# Patient Record
Sex: Male | Born: 1945
Health system: Southern US, Community
[De-identification: ages and names within clinical notes are randomized; demographics above are authoritative.]

## PROBLEM LIST (undated history)

## (undated) DIAGNOSIS — T8131XD Disruption of external operation (surgical) wound, not elsewhere classified, subsequent encounter: Secondary | ICD-10-CM

## (undated) DIAGNOSIS — E119 Type 2 diabetes mellitus without complications: Secondary | ICD-10-CM

## (undated) DIAGNOSIS — L97509 Non-pressure chronic ulcer of other part of unspecified foot with unspecified severity: Secondary | ICD-10-CM

## (undated) DIAGNOSIS — N183 Chronic kidney disease, stage 3 unspecified: Secondary | ICD-10-CM

## (undated) DIAGNOSIS — L02419 Cutaneous abscess of limb, unspecified: Secondary | ICD-10-CM

## (undated) DIAGNOSIS — M1990 Unspecified osteoarthritis, unspecified site: Secondary | ICD-10-CM

## (undated) DIAGNOSIS — M869 Osteomyelitis, unspecified: Secondary | ICD-10-CM

## (undated) DIAGNOSIS — L03119 Cellulitis of unspecified part of limb: Secondary | ICD-10-CM

## (undated) DIAGNOSIS — K219 Gastro-esophageal reflux disease without esophagitis: Secondary | ICD-10-CM

## (undated) DIAGNOSIS — E1142 Type 2 diabetes mellitus with diabetic polyneuropathy: Secondary | ICD-10-CM

## (undated) DIAGNOSIS — S88119A Complete traumatic amputation at level between knee and ankle, unspecified lower leg, initial encounter: Secondary | ICD-10-CM

## (undated) DIAGNOSIS — E11621 Type 2 diabetes mellitus with foot ulcer: Secondary | ICD-10-CM

## (undated) DIAGNOSIS — I639 Cerebral infarction, unspecified: Secondary | ICD-10-CM

## (undated) HISTORY — PX: EYE SURGERY: SHX253

## (undated) HISTORY — DX: Complete traumatic amputation at level between knee and ankle, unspecified lower leg, initial encounter: S88.119A

## (undated) HISTORY — DX: Cutaneous abscess of limb, unspecified: L02.419

## (undated) HISTORY — DX: Disruption of external operation (surgical) wound, not elsewhere classified, subsequent encounter: T81.31XD

## (undated) HISTORY — DX: Cellulitis of unspecified part of limb: L03.119

## (undated) HISTORY — PX: CATARACT EXTRACTION W/ INTRAOCULAR LENS  IMPLANT, BILATERAL: SHX1307

---

## 2011-07-19 HISTORY — PX: TOE AMPUTATION: SHX809

## 2014-10-03 ENCOUNTER — Emergency Department (HOSPITAL_COMMUNITY): Payer: Medicare Other

## 2014-10-03 ENCOUNTER — Encounter (HOSPITAL_COMMUNITY): Payer: Self-pay | Admitting: *Deleted

## 2014-10-03 ENCOUNTER — Emergency Department (HOSPITAL_COMMUNITY)
Admission: EM | Admit: 2014-10-03 | Discharge: 2014-10-03 | Disposition: A | Discharge status: Home / Self Care | Payer: Medicare Other | Attending: Emergency Medicine | Admitting: Emergency Medicine

## 2014-10-03 DIAGNOSIS — L97529 Non-pressure chronic ulcer of other part of left foot with unspecified severity: Secondary | ICD-10-CM | POA: Insufficient documentation

## 2014-10-03 DIAGNOSIS — E11621 Type 2 diabetes mellitus with foot ulcer: Secondary | ICD-10-CM | POA: Diagnosis not present

## 2014-10-03 DIAGNOSIS — M79672 Pain in left foot: Secondary | ICD-10-CM | POA: Diagnosis present

## 2014-10-03 LAB — CBC WITH DIFFERENTIAL/PLATELET
BASOS PCT: 0 % (ref 0–1)
Basophils Absolute: 0 10*3/uL (ref 0.0–0.1)
EOS ABS: 0 10*3/uL (ref 0.0–0.7)
Eosinophils Relative: 0 % (ref 0–5)
HCT: 38.6 % — ABNORMAL LOW (ref 39.0–52.0)
Hemoglobin: 13 g/dL (ref 13.0–17.0)
LYMPHS PCT: 14 % (ref 12–46)
Lymphs Abs: 1.1 10*3/uL (ref 0.7–4.0)
MCH: 29.9 pg (ref 26.0–34.0)
MCHC: 33.7 g/dL (ref 30.0–36.0)
MCV: 88.7 fL (ref 78.0–100.0)
Monocytes Absolute: 0.5 10*3/uL (ref 0.1–1.0)
Monocytes Relative: 6 % (ref 3–12)
NEUTROS ABS: 6.3 10*3/uL (ref 1.7–7.7)
NEUTROS PCT: 80 % — AB (ref 43–77)
Platelets: 175 10*3/uL (ref 150–400)
RBC: 4.35 MIL/uL (ref 4.22–5.81)
RDW: 12.4 % (ref 11.5–15.5)
WBC: 8 10*3/uL (ref 4.0–10.5)

## 2014-10-03 LAB — COMPREHENSIVE METABOLIC PANEL
ALK PHOS: 80 U/L (ref 39–117)
ALT: 16 U/L (ref 0–53)
AST: 15 U/L (ref 0–37)
Albumin: 3.8 g/dL (ref 3.5–5.2)
Anion gap: 9 (ref 5–15)
BUN: 21 mg/dL (ref 6–23)
CHLORIDE: 101 mmol/L (ref 96–112)
CO2: 23 mmol/L (ref 19–32)
Calcium: 9.2 mg/dL (ref 8.4–10.5)
Creatinine, Ser: 1.58 mg/dL — ABNORMAL HIGH (ref 0.50–1.35)
GFR calc non Af Amer: 43 mL/min — ABNORMAL LOW (ref >90)
GFR, EST AFRICAN AMERICAN: 50 mL/min — AB (ref >90)
Glucose, Bld: 388 mg/dL — ABNORMAL HIGH (ref 70–99)
POTASSIUM: 4.7 mmol/L (ref 3.5–5.1)
Sodium: 133 mmol/L — ABNORMAL LOW (ref 135–145)
TOTAL PROTEIN: 6.7 g/dL (ref 6.0–8.3)
Total Bilirubin: 1.1 mg/dL (ref 0.3–1.2)

## 2014-10-03 LAB — CBG MONITORING, ED: Glucose-Capillary: 255 mg/dL — ABNORMAL HIGH (ref 70–99)

## 2014-10-03 MED ORDER — PIPERACILLIN-TAZOBACTAM 3.375 G IVPB 30 MIN
3.3750 g | Freq: Once | INTRAVENOUS | Status: AC
  Administered 2014-10-03: 3.375 g via INTRAVENOUS
  Filled 2014-10-03: qty 50

## 2014-10-03 MED ORDER — SULFAMETHOXAZOLE-TRIMETHOPRIM 800-160 MG PO TABS: 1.0000 | ORAL_TABLET | Freq: Two times a day (BID) | ORAL | Status: DC

## 2014-10-03 MED ORDER — INSULIN ASPART 100 UNIT/ML ~~LOC~~ SOLN
12.0000 [IU] | Freq: Once | SUBCUTANEOUS | Status: DC
  Filled 2014-10-03: qty 1

## 2014-10-03 MED ORDER — SODIUM CHLORIDE 0.9 % IV BOLUS (SEPSIS)
1000.0000 mL | Freq: Once | INTRAVENOUS | Status: AC
  Administered 2014-10-03: 1000 mL via INTRAVENOUS

## 2014-10-03 MED ORDER — CLINDAMYCIN HCL 150 MG PO CAPS: 450.0000 mg | ORAL_CAPSULE | Freq: Three times a day (TID) | ORAL | Status: DC

## 2014-10-03 MED ORDER — CLINDAMYCIN PHOSPHATE 900 MG/50ML IV SOLN
900.0000 mg | Freq: Once | INTRAVENOUS | Status: AC
  Administered 2014-10-03: 900 mg via INTRAVENOUS
  Filled 2014-10-03: qty 50

## 2014-10-03 NOTE — Discharge Instructions (Signed)

## 2014-10-03 NOTE — ED Notes (Signed)
The pt is a diabetic and he has had a blister on his rt foot since December.  The pt noticed and increase in the size of the blister since yesterday.  It is red and swollen and more painful.

## 2014-10-03 NOTE — ED Notes (Signed)
Xray instructed order is for wrong foot, will switch it.

## 2014-10-03 NOTE — ED Provider Notes (Signed)
CSN: [id number]     Arrival date & time 10/03/14  1512 History   First MD Initiated Contact with Patient 10/03/14 1924     Chief Complaint  Patient presents with  . Foot Pain     (Consider location/radiation/quality/duration/timing/severity/associated sxs/prior Treatment) HPI  This is a 69 year old male, with a history of diabetes. He is coming in today with an ulcer he has had on the bottom of his left foot since December. He seen his podiatrist for the same ulcer been watching it. However, for the last 3-4 days he's noticed that there's been surrounding erythema. On Sunday of this last week he thinks he had a fever but none since then. His minimal pain associated with it, and no new injuries.  Past Medical History  Diagnosis Date  . Diabetes mellitus without complication    History reviewed. No pertinent past surgical history. No family history on file. History  Substance Use Topics  . Smoking status: Never Smoker   . Smokeless tobacco: Not on file  . Alcohol Use: Yes    Review of Systems  Constitutional: Negative for fever and chills.  Eyes: Negative for redness.  Respiratory: Negative for cough and shortness of breath.   Cardiovascular: Negative for chest pain.  Gastrointestinal: Negative for nausea, vomiting, abdominal pain and diarrhea.  Genitourinary: Negative for dysuria.  Skin: Positive for rash and wound.  Neurological: Negative for headaches.  All other systems reviewed and are negative.     Allergies  Review of patient's allergies indicates no known allergies.  Home Medications   Prior to Admission medications   Not on File   BP 163/86 mmHg  Pulse 88  Temp(Src) 98.1 F (36.7 C) (Oral)  Resp 18  SpO2 99% Physical Exam  Constitutional: He is oriented to person, place, and time. No distress.  HENT:  Head: Normocephalic and atraumatic.  Eyes: EOM are normal. Pupils are equal, round, and reactive to light.  Neck: Normal range of motion. Neck supple.   Cardiovascular: Normal rate.   Pulmonary/Chest: Effort normal. No respiratory distress.  Abdominal: Soft. There is no tenderness.  Musculoskeletal:  2cm ulcer on the plantar surface of the left foot.  There is mild surrounding erythema/warmth.  There is a small amount of pus coming out of the ulcer.  No crepitus.  No areas of fluctuance.  Able to probe the ulcer to bone  Neurological: He is alert and oriented to person, place, and time.  Skin: He is not diaphoretic.  Psychiatric: He has a normal mood and affect.  Vitals reviewed.   ED Course  Procedures (including critical care time) Labs Review Labs Reviewed  CBC WITH DIFFERENTIAL/PLATELET - Abnormal; Notable for the following:    HCT 38.6 (*)    Neutrophils Relative % 80 (*)    All other components within normal limits  COMPREHENSIVE METABOLIC PANEL - Abnormal; Notable for the following:    Sodium 133 (*)    Glucose, Bld 388 (*)    Creatinine, Ser 1.58 (*)    GFR calc non Af Amer 43 (*)    GFR calc Af Amer 50 (*)    All other components within normal limits    Imaging Review No results found.   EKG Interpretation None      MDM   Final diagnoses:  None    This is a 69 year old male, with a history of diabetes. He is coming in today with an ulcer he has had on the bottom of his right foot since  December. He seen his podiatrist for the same ulcer been watching it. However, last 3-4 days he's noticed that there's been surrounding erythema.  Exam as above, consistent with diabetic foot ulcer w/ surrounding cellulitis.  Able to probe the ulcer to bone raising some concern for possible osteomyelitis.  Plain film obtained and without obvious osteomyelitis.  Labs obtained and no leukocytosis, glucose elevated without acidosis.  Normal vital signs.  Non-toxic appearing  Have recommended admission given this infection with poor glycemic control however patient is adamant about going home.  Have given clinda in the ED.  Will  d/c on bactrim/clinda for possible polymicrobial infection.  Have offered admission multiple times for obs but patient wants to go home. He realizes that this infection could easily worsen possibly resulting in later need for amputation.  Patient will follow up closely with his podiatrist or pcp this coming Monday.  I have discussed the results, Dx and Tx plan with the patient. They expressed understanding and agree with the plan and were told to return to ED with any worsening of condition or concern.    Disposition: Discharge  Condition: Good  Discharge Medication List as of 10/03/2014 10:33 PM    START taking these medications   Details  clindamycin (CLEOCIN) 150 MG capsule Take 3 capsules (450 mg total) by mouth 3 (three) times daily., Starting 10/03/2014, Until Discontinued, Print    sulfamethoxazole-trimethoprim (SEPTRA DS) 800-160 MG per tablet Take 1 tablet by mouth every 12 (twelve) hours., Starting 10/03/2014, Until Discontinued, Print        Follow Up: [facility]              [address] [place] [id number] [phone] [id number] In 3 days    Pt seen in conjunction with Dr. Imagene Shellersteinl     Homar Weinkauf, MD 10/04/14 1739  Cathren LaineKevin Steinl, MD 10/05/14 [phone]

## 2014-10-03 NOTE — ED Notes (Signed)
Toe amp on the rt foot 4 years ago

## 2014-10-06 ENCOUNTER — Encounter (HOSPITAL_BASED_OUTPATIENT_CLINIC_OR_DEPARTMENT_OTHER): Payer: Medicare Other | Attending: Plastic Surgery

## 2014-10-06 DIAGNOSIS — G629 Polyneuropathy, unspecified: Secondary | ICD-10-CM | POA: Insufficient documentation

## 2014-10-06 DIAGNOSIS — E11621 Type 2 diabetes mellitus with foot ulcer: Secondary | ICD-10-CM | POA: Insufficient documentation

## 2014-10-06 DIAGNOSIS — L97421 Non-pressure chronic ulcer of left heel and midfoot limited to breakdown of skin: Secondary | ICD-10-CM | POA: Insufficient documentation

## 2014-10-06 DIAGNOSIS — Z89421 Acquired absence of other right toe(s): Secondary | ICD-10-CM | POA: Diagnosis not present

## 2014-10-09 DIAGNOSIS — Z89421 Acquired absence of other right toe(s): Secondary | ICD-10-CM | POA: Diagnosis not present

## 2014-10-09 DIAGNOSIS — G629 Polyneuropathy, unspecified: Secondary | ICD-10-CM | POA: Diagnosis not present

## 2014-10-09 DIAGNOSIS — L97421 Non-pressure chronic ulcer of left heel and midfoot limited to breakdown of skin: Secondary | ICD-10-CM | POA: Diagnosis not present

## 2014-10-09 DIAGNOSIS — E11621 Type 2 diabetes mellitus with foot ulcer: Secondary | ICD-10-CM | POA: Diagnosis not present

## 2014-10-13 ENCOUNTER — Inpatient Hospital Stay (HOSPITAL_COMMUNITY)
Admission: EM | Admit: 2014-10-13 | Discharge: 2014-10-20 | DRG: 982 | Disposition: A | Discharge status: Home / Self Care | Payer: Medicare Other | Attending: Internal Medicine | Admitting: Internal Medicine

## 2014-10-13 ENCOUNTER — Emergency Department (HOSPITAL_COMMUNITY): Payer: Medicare Other

## 2014-10-13 ENCOUNTER — Encounter (HOSPITAL_COMMUNITY): Payer: Self-pay | Admitting: Neurology

## 2014-10-13 ENCOUNTER — Ambulatory Visit (INDEPENDENT_AMBULATORY_CARE_PROVIDER_SITE_OTHER)
Admission: RE | Admit: 2014-10-13 | Discharge: 2014-10-13 | Disposition: A | Discharge status: Home / Self Care | Payer: Medicare Other | Source: Ambulatory Visit | Attending: Surgery | Admitting: Surgery

## 2014-10-13 ENCOUNTER — Other Ambulatory Visit (HOSPITAL_COMMUNITY): Payer: Self-pay | Admitting: Plastic Surgery

## 2014-10-13 DIAGNOSIS — L97423 Non-pressure chronic ulcer of left heel and midfoot with necrosis of muscle: Secondary | ICD-10-CM | POA: Diagnosis present

## 2014-10-13 DIAGNOSIS — Z89421 Acquired absence of other right toe(s): Secondary | ICD-10-CM | POA: Diagnosis not present

## 2014-10-13 DIAGNOSIS — L97523 Non-pressure chronic ulcer of other part of left foot with necrosis of muscle: Secondary | ICD-10-CM | POA: Diagnosis present

## 2014-10-13 DIAGNOSIS — E1122 Type 2 diabetes mellitus with diabetic chronic kidney disease: Secondary | ICD-10-CM | POA: Diagnosis present

## 2014-10-13 DIAGNOSIS — D649 Anemia, unspecified: Secondary | ICD-10-CM

## 2014-10-13 DIAGNOSIS — L02612 Cutaneous abscess of left foot: Secondary | ICD-10-CM | POA: Diagnosis present

## 2014-10-13 DIAGNOSIS — L02419 Cutaneous abscess of limb, unspecified: Secondary | ICD-10-CM

## 2014-10-13 DIAGNOSIS — E875 Hyperkalemia: Secondary | ICD-10-CM | POA: Diagnosis not present

## 2014-10-13 DIAGNOSIS — E860 Dehydration: Secondary | ICD-10-CM | POA: Diagnosis present

## 2014-10-13 DIAGNOSIS — E083219 Diabetes mellitus due to underlying condition with mild nonproliferative diabetic retinopathy with macular edema, unspecified eye: Secondary | ICD-10-CM

## 2014-10-13 DIAGNOSIS — E1121 Type 2 diabetes mellitus with diabetic nephropathy: Secondary | ICD-10-CM | POA: Diagnosis not present

## 2014-10-13 DIAGNOSIS — E119 Type 2 diabetes mellitus without complications: Secondary | ICD-10-CM

## 2014-10-13 DIAGNOSIS — L03119 Cellulitis of unspecified part of limb: Secondary | ICD-10-CM | POA: Diagnosis not present

## 2014-10-13 DIAGNOSIS — D631 Anemia in chronic kidney disease: Secondary | ICD-10-CM | POA: Diagnosis present

## 2014-10-13 DIAGNOSIS — E1129 Type 2 diabetes mellitus with other diabetic kidney complication: Secondary | ICD-10-CM

## 2014-10-13 DIAGNOSIS — E871 Hypo-osmolality and hyponatremia: Secondary | ICD-10-CM | POA: Diagnosis present

## 2014-10-13 DIAGNOSIS — L97529 Non-pressure chronic ulcer of other part of left foot with unspecified severity: Secondary | ICD-10-CM

## 2014-10-13 DIAGNOSIS — E118 Type 2 diabetes mellitus with unspecified complications: Secondary | ICD-10-CM

## 2014-10-13 DIAGNOSIS — N183 Chronic kidney disease, stage 3 unspecified: Secondary | ICD-10-CM

## 2014-10-13 DIAGNOSIS — L97421 Non-pressure chronic ulcer of left heel and midfoot limited to breakdown of skin: Secondary | ICD-10-CM | POA: Diagnosis not present

## 2014-10-13 DIAGNOSIS — D509 Iron deficiency anemia, unspecified: Secondary | ICD-10-CM | POA: Diagnosis present

## 2014-10-13 DIAGNOSIS — L98499 Non-pressure chronic ulcer of skin of other sites with unspecified severity: Secondary | ICD-10-CM | POA: Diagnosis not present

## 2014-10-13 DIAGNOSIS — E1165 Type 2 diabetes mellitus with hyperglycemia: Secondary | ICD-10-CM | POA: Diagnosis present

## 2014-10-13 DIAGNOSIS — Z89422 Acquired absence of other left toe(s): Secondary | ICD-10-CM

## 2014-10-13 DIAGNOSIS — G629 Polyneuropathy, unspecified: Secondary | ICD-10-CM | POA: Diagnosis not present

## 2014-10-13 DIAGNOSIS — E11621 Type 2 diabetes mellitus with foot ulcer: Principal | ICD-10-CM | POA: Insufficient documentation

## 2014-10-13 HISTORY — DX: Type 2 diabetes mellitus with foot ulcer: E11.621

## 2014-10-13 HISTORY — DX: Non-pressure chronic ulcer of other part of unspecified foot with unspecified severity: L97.509

## 2014-10-13 HISTORY — DX: Chronic kidney disease, stage 3 unspecified: N18.30

## 2014-10-13 HISTORY — DX: Chronic kidney disease, stage 3 (moderate): N18.3

## 2014-10-13 LAB — CBC WITH DIFFERENTIAL/PLATELET
Basophils Absolute: 0.1 10*3/uL (ref 0.0–0.1)
Basophils Relative: 0 % (ref 0–1)
Eosinophils Absolute: 0 10*3/uL (ref 0.0–0.7)
Eosinophils Relative: 0 % (ref 0–5)
HCT: 36.3 % — ABNORMAL LOW (ref 39.0–52.0)
Hemoglobin: 12.4 g/dL — ABNORMAL LOW (ref 13.0–17.0)
Lymphocytes Relative: 10 % — ABNORMAL LOW (ref 12–46)
Lymphs Abs: 1.3 10*3/uL (ref 0.7–4.0)
MCH: 29.6 pg (ref 26.0–34.0)
MCHC: 34.2 g/dL (ref 30.0–36.0)
MCV: 86.6 fL (ref 78.0–100.0)
Monocytes Absolute: 0.9 10*3/uL (ref 0.1–1.0)
Monocytes Relative: 7 % (ref 3–12)
Neutro Abs: 10.4 10*3/uL — ABNORMAL HIGH (ref 1.7–7.7)
Neutrophils Relative %: 83 % — ABNORMAL HIGH (ref 43–77)
PLATELETS: 329 10*3/uL (ref 150–400)
RBC: 4.19 MIL/uL — ABNORMAL LOW (ref 4.22–5.81)
RDW: 12.9 % (ref 11.5–15.5)
WBC: 12.6 10*3/uL — ABNORMAL HIGH (ref 4.0–10.5)

## 2014-10-13 LAB — BASIC METABOLIC PANEL
Anion gap: 12 (ref 5–15)
BUN: 30 mg/dL — ABNORMAL HIGH (ref 6–23)
CALCIUM: 9.1 mg/dL (ref 8.4–10.5)
CO2: 20 mmol/L (ref 19–32)
CREATININE: 1.69 mg/dL — AB (ref 0.50–1.35)
Chloride: 95 mmol/L — ABNORMAL LOW (ref 96–112)
GFR calc Af Amer: 46 mL/min — ABNORMAL LOW (ref >90)
GFR, EST NON AFRICAN AMERICAN: 40 mL/min — AB (ref >90)
GLUCOSE: 336 mg/dL — AB (ref 70–99)
Potassium: 5.2 mmol/L — ABNORMAL HIGH (ref 3.5–5.1)
SODIUM: 127 mmol/L — AB (ref 135–145)

## 2014-10-13 LAB — SEDIMENTATION RATE: Sed Rate: 115 mm/hr — ABNORMAL HIGH (ref 0–16)

## 2014-10-13 LAB — GLUCOSE, CAPILLARY: GLUCOSE-CAPILLARY: 253 mg/dL — AB (ref 70–99)

## 2014-10-13 LAB — CBG MONITORING, ED: Glucose-Capillary: 341 mg/dL — ABNORMAL HIGH (ref 70–99)

## 2014-10-13 MED ORDER — INSULIN ASPART 100 UNIT/ML ~~LOC~~ SOLN
0.0000 [IU] | Freq: Three times a day (TID) | SUBCUTANEOUS | Status: DC
  Administered 2014-10-14: 7 [IU] via SUBCUTANEOUS
  Administered 2014-10-14: 5 [IU] via SUBCUTANEOUS
  Administered 2014-10-14: 3 [IU] via SUBCUTANEOUS
  Administered 2014-10-15: 5 [IU] via SUBCUTANEOUS
  Administered 2014-10-15: 3 [IU] via SUBCUTANEOUS
  Administered 2014-10-15: 7 [IU] via SUBCUTANEOUS
  Administered 2014-10-16 (×2): 5 [IU] via SUBCUTANEOUS

## 2014-10-13 MED ORDER — PIPERACILLIN-TAZOBACTAM 3.375 G IVPB
3.3750 g | Freq: Three times a day (TID) | INTRAVENOUS | Status: DC
  Administered 2014-10-14 – 2014-10-20 (×19): 3.375 g via INTRAVENOUS
  Filled 2014-10-13 (×22): qty 50

## 2014-10-13 MED ORDER — VANCOMYCIN HCL 10 G IV SOLR
2500.0000 mg | Freq: Once | INTRAVENOUS | Status: AC
  Administered 2014-10-13: 2500 mg via INTRAVENOUS
  Filled 2014-10-13: qty 2500

## 2014-10-13 MED ORDER — ENOXAPARIN SODIUM 30 MG/0.3ML ~~LOC~~ SOLN
30.0000 mg | SUBCUTANEOUS | Status: DC
  Filled 2014-10-13: qty 0.3

## 2014-10-13 MED ORDER — GLIMEPIRIDE 4 MG PO TABS
4.0000 mg | ORAL_TABLET | Freq: Two times a day (BID) | ORAL | Status: DC
  Administered 2014-10-13 – 2014-10-15 (×3): 4 mg via ORAL
  Filled 2014-10-13 (×4): qty 1

## 2014-10-13 MED ORDER — SODIUM POLYSTYRENE SULFONATE 15 GM/60ML PO SUSP
15.0000 g | Freq: Once | ORAL | Status: AC
Start: 2014-10-13 — End: 2014-10-14
  Administered 2014-10-14: 15 g via ORAL
  Filled 2014-10-13: qty 60

## 2014-10-13 MED ORDER — INSULIN ASPART 100 UNIT/ML ~~LOC~~ SOLN
0.0000 [IU] | Freq: Every day | SUBCUTANEOUS | Status: DC
  Administered 2014-10-13 – 2014-10-15 (×2): 3 [IU] via SUBCUTANEOUS
  Administered 2014-10-16 – 2014-10-18 (×2): 2 [IU] via SUBCUTANEOUS

## 2014-10-13 MED ORDER — ACETAMINOPHEN 650 MG RE SUPP: 650.0000 mg | Freq: Four times a day (QID) | RECTAL | Status: DC | PRN

## 2014-10-13 MED ORDER — CIPROFLOXACIN IN D5W 400 MG/200ML IV SOLN: 400.0000 mg | Freq: Two times a day (BID) | INTRAVENOUS | Status: DC

## 2014-10-13 MED ORDER — SODIUM CHLORIDE 0.9 % IV SOLN
INTRAVENOUS | Status: AC
  Administered 2014-10-13: 23:00:00 via INTRAVENOUS

## 2014-10-13 MED ORDER — PIPERACILLIN-TAZOBACTAM 3.375 G IVPB 30 MIN
3.3750 g | Freq: Once | INTRAVENOUS | Status: AC
  Administered 2014-10-14: 3.375 g via INTRAVENOUS

## 2014-10-13 MED ORDER — INSULIN ASPART 100 UNIT/ML ~~LOC~~ SOLN
10.0000 [IU] | Freq: Once | SUBCUTANEOUS | Status: AC
  Administered 2014-10-13: 10 [IU] via SUBCUTANEOUS
  Filled 2014-10-13: qty 1

## 2014-10-13 MED ORDER — PIPERACILLIN-TAZOBACTAM 3.375 G IVPB: 3.3750 g | Freq: Three times a day (TID) | INTRAVENOUS | Status: DC

## 2014-10-13 MED ORDER — SODIUM CHLORIDE 0.9 % IV BOLUS (SEPSIS)
1000.0000 mL | Freq: Once | INTRAVENOUS | Status: AC
  Administered 2014-10-13: 1000 mL via INTRAVENOUS

## 2014-10-13 MED ORDER — ACETAMINOPHEN 325 MG PO TABS
650.0000 mg | ORAL_TABLET | Freq: Four times a day (QID) | ORAL | Status: DC | PRN
  Administered 2014-10-16: 650 mg via ORAL
  Filled 2014-10-13 (×2): qty 2

## 2014-10-13 MED ORDER — SODIUM CHLORIDE 0.9 % IV SOLN
1.0000 g | Freq: Once | INTRAVENOUS | Status: AC
  Administered 2014-10-13: 1 g via INTRAVENOUS
  Filled 2014-10-13: qty 10

## 2014-10-13 NOTE — ED Provider Notes (Signed)
CSN: [id number]     Arrival date & time 10/13/14  1630 History   None    Chief Complaint  Patient presents with  . Wound Check     (Consider location/radiation/quality/duration/timing/severity/associated sxs/prior Treatment) HPI   69 year old male with history of non-insulin-dependent diabetes presenting for evaluation of chronic diabetic foot ulcer. Patient has an ulcer to the palm of his left foot since December. He has been seen and evaluated by his podiatrist. He was recently evaluated in the ED on 3/18 for worsening infection of this foot. At that time it was deemed that he would benefit from hospital admission for IV abx and further treatment of his foot infection.  Pt decline admission.  He was discharged with clinda/bactrim.  Today he was brough there by wife from wound care center due to worsening of his wound to L foot.  Despite taking abx as prescribed the infection has not improved.  Wife report decreased in appetite, eating much less than usual.  Pt report decrease vision to both eyes.  Otherwise, he denies fever, chills, cp, sob, abd pain, increasing foot pain.  Pt lives in [place], does not have local PCP.  He is amenable to hospital admission this time.    Past Medical History  Diagnosis Date  . Diabetes mellitus without complication    History reviewed. No pertinent past surgical history. No family history on file. History  Substance Use Topics  . Smoking status: Never Smoker   . Smokeless tobacco: Not on file  . Alcohol Use: Yes    Review of Systems  All other systems reviewed and are negative.     Allergies  Review of patient's allergies indicates no known allergies.  Home Medications   Prior to Admission medications   Medication Sig Start Date End Date Taking? Authorizing Provider  clindamycin (CLEOCIN) 150 MG capsule Take 3 capsules (450 mg total) by mouth 3 (three) times daily. 10/03/14   Silas Flood, MD  glimepiride (AMARYL) 4 MG tablet Take 4 mg by  mouth 2 (two) times daily. 08/21/14   Historical Provider, MD  Omega-3 Fatty Acids (OMEGA 3 PO) Take 1 capsule by mouth every evening.    Historical Provider, MD  Omega-3 Fatty Acids (SEA-OMEGA 70 PO) Take 1 capsule by mouth every morning.    Historical Provider, MD  sulfamethoxazole-trimethoprim (SEPTRA DS) 800-160 MG per tablet Take 1 tablet by mouth every 12 (twelve) hours. 10/03/14   Silas Flood, MD   BP 125/62 mmHg  Pulse 90  Temp(Src) 98 F (36.7 C)  Resp 18  SpO2 100% Physical Exam  Constitutional: He appears well-developed and well-nourished. No distress.  HENT:  Head: Atraumatic.  Eyes: Conjunctivae are normal.  Neck: Normal range of motion. Neck supple.  Cardiovascular: Normal rate and regular rhythm.   Pulmonary/Chest: Effort normal and breath sounds normal.  Abdominal: Soft. He exhibits no distension. There is no tenderness.  Neurological: He is alert.  Skin: No rash noted.  L foot: two 3mm ulcers to sole of foot with packing in place.  Wound is approximately 6mm in depth on the plantar surface.  Minimal tenderness, mild surrounding erythema.  Intact distal pulse.  Psychiatric: He has a normal mood and affect.    ED Course  Procedures (including critical care time)  6:29 PM Worsening diabetic foot ulcer to L foot, which failed outpt abx treatment including clinda/bactrim.  Xray of L foot shows mild cortical irregularity of the first metatarsal head, which osteomyelitis is not excluded.  Given a  tunneling wound that failed outpt treatment, will initiate vanc/cipro for suspect osteomyelitis due to diabetic foot ulcer.  Evidence of renal insufficiency, likely acute given pt decreased appetite.  Pt is hyperglycemic with normal anion gap.  Insulin given along with IVF.  Care discussed with DR. Littie DeedsGentry, plan to admit pt for further care.    7:39 PM Appreciated consult from Hospitalist Dr. Selena BattenKim who agrees to admit pt to med surg for further care.  Pt made aware of plan.    Labs  Review Labs Reviewed  CBC WITH DIFFERENTIAL/PLATELET - Abnormal; Notable for the following:    WBC 12.6 (*)    RBC 4.19 (*)    Hemoglobin 12.4 (*)    HCT 36.3 (*)    Neutrophils Relative % 83 (*)    Neutro Abs 10.4 (*)    Lymphocytes Relative 10 (*)    All other components within normal limits  BASIC METABOLIC PANEL - Abnormal; Notable for the following:    Sodium 127 (*)    Potassium 5.2 (*)    Chloride 95 (*)    Glucose, Bld 336 (*)    BUN 30 (*)    Creatinine, Ser 1.69 (*)    GFR calc non Af Amer 40 (*)    GFR calc Af Amer 46 (*)    All other components within normal limits  CBG MONITORING, ED - Abnormal; Notable for the following:    Glucose-Capillary 341 (*)    All other components within normal limits    Imaging Review Dg Foot Complete Left  10/13/2014   CLINICAL DATA:  Left foot wound with swelling and pain.  EXAM: LEFT FOOT - COMPLETE 3+ VIEW  COMPARISON:  10/03/2014  FINDINGS: Soft tissue swelling is noted.  There is no evidence of acute fracture, subluxation or dislocation.  There is mild cortical irregularity of the lateral first metatarsal head. Osteomyelitis is not excluded.  A calcaneal spur is present.  IMPRESSION: Mild cortical irregularity of the first metatarsal head -osteomyelitis is not excluded. Correlate with wound location.  Soft tissue swelling.   Electronically Signed   By: Harmon PierJeffrey  Hu M.D.   On: 10/13/2014 18:14     EKG Interpretation None      MDM   Final diagnoses:  Diabetic ulcer of left foot associated with type 2 diabetes mellitus    BP 130/81 mmHg  Pulse 79  Temp(Src) 98.6 F (37 C) (Oral)  Resp 20  Ht 6\' 2"  (1.88 m)  Wt 220 lb (99.791 kg)  BMI 28.23 kg/m2  SpO2 100%  I have reviewed nursing notes and vital signs. I personally reviewed the imaging tests through PACS system  I reviewed available ER/hospitalization records thought the EMR     Fayrene HelperBowie Ishan Sanroman, PA-C 10/13/14 1940  Mirian MoMatthew Gentry, MD 10/15/14 [phone]

## 2014-10-13 NOTE — H&P (Signed)
Gared Gillie is an 69 y.o. male.    Pcp:  Dr. Teryl Lucy [facility] [place])  Chief Complaint: diabetic foot ulcer HPI: 69 yo male with dm2, c/o diabetic foot ulcer started in 06/2014 and was getting better and then got worse over the past 10 days,  Slight yellow drainage.  Pt denies fever, chills, cp, palp, sob.  Pt has been following with the wound clinic.  Pt sent to ER from wound clinic.  Pt will be admitted for outpatient failure of abx for diabetic foot ulcer.    Past Medical History  Diagnosis Date  . Diabetes mellitus without complication   . Diabetic foot ulcer     left  . CKD (chronic kidney disease) stage 3, GFR 30-59 ml/min     Past Surgical History  Procedure Laterality Date  . Toe amputation      Family History  Problem Relation Age of Onset  . Dementia Mother    Social History:  reports that he has never smoked. He does not have any smokeless tobacco history on file. He reports that he drinks about 0.6 oz of alcohol per week. His drug history is not on file.  Allergies: No Known Allergies Medicin e reviewed  (Not in a hospital admission)  Results for orders placed or performed during the hospital encounter of 10/13/14 (from the past 48 hour(s))  POC CBG, ED     Status: Abnormal   Collection Time: 10/13/14  4:47 PM  Result Value Ref Range   Glucose-Capillary 341 (H) 70 - 99 mg/dL  CBC with Differential     Status: Abnormal   Collection Time: 10/13/14  4:53 PM  Result Value Ref Range   WBC 12.6 (H) 4.0 - 10.5 K/uL   RBC 4.19 (L) 4.22 - 5.81 MIL/uL   Hemoglobin 12.4 (L) 13.0 - 17.0 g/dL   HCT 36.3 (L) 39.0 - 52.0 %   MCV 86.6 78.0 - 100.0 fL   MCH 29.6 26.0 - 34.0 pg   MCHC 34.2 30.0 - 36.0 g/dL   RDW 12.9 11.5 - 15.5 %   Platelets 329 150 - 400 K/uL   Neutrophils Relative % 83 (H) 43 - 77 %   Neutro Abs 10.4 (H) 1.7 - 7.7 K/uL   Lymphocytes Relative 10 (L) 12 - 46 %   Lymphs Abs 1.3 0.7 - 4.0 K/uL   Monocytes Relative 7 3 - 12 %   Monocytes Absolute 0.9 0.1  - 1.0 K/uL   Eosinophils Relative 0 0 - 5 %   Eosinophils Absolute 0.0 0.0 - 0.7 K/uL   Basophils Relative 0 0 - 1 %   Basophils Absolute 0.1 0.0 - 0.1 K/uL  Basic metabolic panel     Status: Abnormal   Collection Time: 10/13/14  4:53 PM  Result Value Ref Range   Sodium 127 (L) 135 - 145 mmol/L   Potassium 5.2 (H) 3.5 - 5.1 mmol/L   Chloride 95 (L) 96 - 112 mmol/L   CO2 20 19 - 32 mmol/L   Glucose, Bld 336 (H) 70 - 99 mg/dL   BUN 30 (H) 6 - 23 mg/dL   Creatinine, Ser 1.69 (H) 0.50 - 1.35 mg/dL   Calcium 9.1 8.4 - 10.5 mg/dL   GFR calc non Af Amer 40 (L) >90 mL/min   GFR calc Af Amer 46 (L) >90 mL/min    Comment: (NOTE) The eGFR has been calculated using the CKD EPI equation. This calculation has not been validated in all clinical situations.  eGFR's persistently <90 mL/min signify possible Chronic Kidney Disease.    Anion gap 12 5 - 15   Dg Foot Complete Left  10/13/2014   CLINICAL DATA:  Left foot wound with swelling and pain.  EXAM: LEFT FOOT - COMPLETE 3+ VIEW  COMPARISON:  10/03/2014  FINDINGS: Soft tissue swelling is noted.  There is no evidence of acute fracture, subluxation or dislocation.  There is mild cortical irregularity of the lateral first metatarsal head. Osteomyelitis is not excluded.  A calcaneal spur is present.  IMPRESSION: Mild cortical irregularity of the first metatarsal head -osteomyelitis is not excluded. Correlate with wound location.  Soft tissue swelling.   Electronically Signed   By: Margarette Canada M.D.   On: 10/13/2014 18:14    Review of Systems  Constitutional: Negative.   HENT: Negative.   Eyes: Negative.   Respiratory: Negative.   Cardiovascular: Negative.   Gastrointestinal: Negative.   Genitourinary: Negative.   Musculoskeletal: Negative.   Skin:       + ulcer on the bottom of the left ft  Neurological: Negative.   Endo/Heme/Allergies: Negative.   Psychiatric/Behavioral: Negative.     Blood pressure 130/81, pulse 79, temperature 98.6 F (37  C), temperature source Oral, resp. rate 20, height 6' 2"  (1.88 m), weight 99.791 kg (220 lb), SpO2 100 %. Physical Exam  Constitutional: He is oriented to person, place, and time. He appears well-developed and well-nourished.  HENT:  Head: Normocephalic and atraumatic.  Mouth/Throat: No oropharyngeal exudate.  Eyes: Conjunctivae and EOM are normal. Pupils are equal, round, and reactive to light. No scleral icterus.  Neck: Normal range of motion. Neck supple. No JVD present. No tracheal deviation present. No thyromegaly present.  Cardiovascular: Normal rate and regular rhythm.  Exam reveals no gallop and no friction rub.   No murmur heard. Respiratory: Effort normal and breath sounds normal. No respiratory distress. He has no wheezes. He has no rales.  GI: Soft. Bowel sounds are normal. He exhibits no distension. There is no tenderness. There is no rebound and no guarding.  Musculoskeletal: Normal range of motion. He exhibits no edema or tenderness.  Lymphadenopathy:    He has no cervical adenopathy.  Neurological: He is alert and oriented to person, place, and time. He has normal reflexes. He displays normal reflexes. No cranial nerve deficit. He exhibits normal muscle tone. Coordination normal.  Skin:  10m ulcer with yellow drainage on the plantar aspect of the left foot, and another more superficial ulcer stage1 0.5cm, clean  Psychiatric: He has a normal mood and affect. His behavior is normal. Judgment and thought content normal.     Assessment/Plan Skin ulcer Wound culture Blood culture x2 Esr, crp vanco iv,  Zosyn iv pharmacy to dose  Anemia Check cbc in am  Hyperkalemia calclium gluconate,  Kayexalate  CKD stage 3 Ns iv  Hyponatremia Check serum osm, tsh, cortisol, urine sodium, urine osm  Dm2 fsbs ac and qhs, iss,   DVT prophylaxis, scd lovenox  Hannia Matchett 10/13/2014, 8:07 PM

## 2014-10-13 NOTE — ED Notes (Signed)
Pt reports wound to arch of left foot; sent from wound center with concern of worsening infection and need for IV antibiotics. Concern for inf in the bone. Also, blood sugars have been running high. Pt is a x 4

## 2014-10-13 NOTE — ED Notes (Signed)
PATIENT BROUGHT TO ED  BY WIFE FROM WOUND CARE CENTER DUE TO WORSENING OF WOUNDS TO LEFT FOOT. PT REPORTS HE HAS BEEN TAKING CLINDAMYCIN AND SULFA AT HOME BUT EVIDENTLY WOUNDS ARE NOT RESPONDING TO. WIFE REPORTS TODAY ONE OF THE WOUND CARE NURSES EXPLORED/MEASURED HIS WOUNDS ON THE BOTTOM OF HIS FEET AND THE WOUND ON THE BALL OF HIS FOOT NOW CONNECTS UNDER THE SKIN WITH THE WOUND IN THE ARCH OF HIS FOOT. PACKING IS VISIBLE IN WOUNDS. PT REPORTS TENDERNESS AT EDGES OF WOUNDS. SKIN IS RED AND PEELING

## 2014-10-13 NOTE — Progress Notes (Addendum)
ANTIBIOTIC CONSULT NOTE - INITIAL  Pharmacy Consult for Ciprofloxacin, Vancomycin  Indication: Wound infection / osteomyelitis   No Known Allergies  Patient Measurements:   Actual Body Weight: 99.8 kg   Vital Signs: Temp: 98.6 F (37 C) (03/28 1834) Temp Source: Oral (03/28 1834) BP: 130/81 mmHg (03/28 1834) Pulse Rate: 79 (03/28 1834) Intake/Output from previous day:   Intake/Output from this shift:    Labs:  Recent Labs  10/13/14 1653  WBC 12.6*  HGB 12.4*  PLT 329  CREATININE 1.69*   CrCl cannot be calculated (Unknown ideal weight.). No results for input(s): VANCOTROUGH, VANCOPEAK, VANCORANDOM, GENTTROUGH, GENTPEAK, GENTRANDOM, TOBRATROUGH, TOBRAPEAK, TOBRARND, AMIKACINPEAK, AMIKACINTROU, AMIKACIN in the last 72 hours.   Microbiology: No results found for this or any previous visit (from the past 720 hour(s)).  Medical History: Past Medical History  Diagnosis Date  . Diabetes mellitus without complication     Medications:   (Not in a hospital admission) Assessment: [age] YOM with h/o of DM presented for evaluation of chronic diabetic foot ulcer. He was recently seen in the ED but declined admission and was sent home on clindamycin and bactrim but the wound has worsened.   WBC elevated at 12.6. Pt is afebrile. CT of foot could not exclude osteomyelitis  Goal of Therapy:  Vancomycin trough level 15-20 mcg/ml  Plan:  -Give Vancomycin 2500 mg IV load dose followed by 750 mg IV Q 12 hours -Ciprofloxacin 400 mg IV Q 12 hours  -Monitor CBC, renal fx, cultures and clinical progress -[facility]  Vinnie LevelBenjamin Whittany Parish, PharmD., BCPS Clinical Pharmacist Pager [phone]  Addendum: Ciprofloxacin switched to Zosyn. Start Zosyn 3.375 gm IV Q 8 hours   Vinnie LevelBenjamin Tiani Stanbery, PharmD., BCPS Clinical Pharmacist Pager [phone]

## 2014-10-14 ENCOUNTER — Inpatient Hospital Stay (HOSPITAL_COMMUNITY): Payer: Medicare Other

## 2014-10-14 DIAGNOSIS — E1129 Type 2 diabetes mellitus with other diabetic kidney complication: Secondary | ICD-10-CM

## 2014-10-14 DIAGNOSIS — N183 Chronic kidney disease, stage 3 (moderate): Secondary | ICD-10-CM

## 2014-10-14 LAB — COMPREHENSIVE METABOLIC PANEL
ALT: 26 U/L (ref 0–53)
AST: 27 U/L (ref 0–37)
Albumin: 2.4 g/dL — ABNORMAL LOW (ref 3.5–5.2)
Alkaline Phosphatase: 92 U/L (ref 39–117)
Anion gap: 8 (ref 5–15)
BUN: 21 mg/dL (ref 6–23)
CALCIUM: 8.5 mg/dL (ref 8.4–10.5)
CHLORIDE: 103 mmol/L (ref 96–112)
CO2: 20 mmol/L (ref 19–32)
Creatinine, Ser: 1.46 mg/dL — ABNORMAL HIGH (ref 0.50–1.35)
GFR calc non Af Amer: 47 mL/min — ABNORMAL LOW (ref >90)
GFR, EST AFRICAN AMERICAN: 55 mL/min — AB (ref >90)
Glucose, Bld: 235 mg/dL — ABNORMAL HIGH (ref 70–99)
Potassium: 4.5 mmol/L (ref 3.5–5.1)
SODIUM: 131 mmol/L — AB (ref 135–145)
Total Bilirubin: 0.8 mg/dL (ref 0.3–1.2)
Total Protein: 6 g/dL (ref 6.0–8.3)

## 2014-10-14 LAB — CBC
HEMATOCRIT: 32.6 % — AB (ref 39.0–52.0)
HEMOGLOBIN: 10.9 g/dL — AB (ref 13.0–17.0)
MCH: 29.1 pg (ref 26.0–34.0)
MCHC: 33.4 g/dL (ref 30.0–36.0)
MCV: 87.2 fL (ref 78.0–100.0)
PLATELETS: 276 10*3/uL (ref 150–400)
RBC: 3.74 MIL/uL — ABNORMAL LOW (ref 4.22–5.81)
RDW: 12.9 % (ref 11.5–15.5)
WBC: 9.3 10*3/uL (ref 4.0–10.5)

## 2014-10-14 LAB — GLUCOSE, CAPILLARY
Glucose-Capillary: 211 mg/dL — ABNORMAL HIGH (ref 70–99)
Glucose-Capillary: 259 mg/dL — ABNORMAL HIGH (ref 70–99)
Glucose-Capillary: 336 mg/dL — ABNORMAL HIGH (ref 70–99)
Glucose-Capillary: 210 mg/dL — ABNORMAL HIGH (ref 70–99)

## 2014-10-14 LAB — C-REACTIVE PROTEIN: CRP: 21.9 mg/dL — AB (ref <0.60)

## 2014-10-14 LAB — URIC ACID: Uric Acid, Serum: 3.3 mg/dL — ABNORMAL LOW (ref 4.0–7.8)

## 2014-10-14 MED ORDER — ENOXAPARIN SODIUM 40 MG/0.4ML ~~LOC~~ SOLN: 40.0000 mg | SUBCUTANEOUS | Status: DC

## 2014-10-14 MED ORDER — SODIUM CHLORIDE 0.9 % IV SOLN
INTRAVENOUS | Status: AC
  Administered 2014-10-14 (×2): via INTRAVENOUS

## 2014-10-14 MED ORDER — VANCOMYCIN HCL IN DEXTROSE 750-5 MG/150ML-% IV SOLN
750.0000 mg | Freq: Two times a day (BID) | INTRAVENOUS | Status: DC
  Administered 2014-10-14 – 2014-10-20 (×12): 750 mg via INTRAVENOUS
  Filled 2014-10-14 (×13): qty 150

## 2014-10-14 NOTE — Progress Notes (Addendum)
[facility] PROGRESS NOTE  Mihir Flanigan ZOX:[id number] DOB: 20-Sep-1945 DOA: 10/13/2014  PCP: does not have a PCP here. Follows with Provider at [facility].  Brief HPI: 69 year old Caucasian male with a past medical history of diabetes mellitus type 2, diabetic foot ulcer who presented with worsening drainage from an ulcer in his left foot. He was prescribed oral antibiotics a few days ago. He appears to have failed outpatient treatment.  Past medical history:  Past Medical History  Diagnosis Date  . Diabetes mellitus without complication   . Diabetic foot ulcer     left  . CKD (chronic kidney disease) stage 3, GFR 30-59 ml/min     Consultants: orthopedics  Procedures: none  Antibiotics: On vancomycin and Zosyn 3/28  Subjective: The patient has some pain in the left foot area. Denies any chest pain, shortness of breath, nausea or vomiting.  Objective: Vital Signs  Filed Vitals:   10/13/14 1832 10/13/14 1834 10/13/14 2137 10/14/14 0636  BP: 130/81 130/81 135/73 131/66  Pulse: 79 79 95 77  Temp: 98.6 F (37 C) 98.6 F (37 C) 98.9 F (37.2 C) 98.5 F (36.9 C)  TempSrc: Oral Oral Oral   Resp: Height:   (1.88 m)    Weight:  99.791 kg (220 lb)    SpO2: 100% 100% 100% 98%    Intake/Output Summary (Last 24 hours) at 10/14/14 0845 Last data filed at 10/14/14 0800  Gross per 24 hour  Intake    120 ml  Output      0 ml  Net    120 ml   Filed Weights   10/13/14 1834  Weight: 99.791 kg (220 lb)    General appearance: alert, cooperative, appears stated age and no distress Resp: clear to auscultation bilaterally Cardio: regular rate and rhythm, S1, S2 normal, no murmur, click, rub or gallop GI: soft, non-tender; bowel sounds normal; no masses,  no organomegaly Extremities: plantar aspect of the left foot reveals one ulcer towards the toes, which does not have any drainage. Appears to be healing. There is another ulcer in the mid aspect of the  plantar area which is draining yellowish fluid. Tender to palpation.  Lab Results:  Basic Metabolic Panel:  Recent Labs Lab 10/13/14 1653 10/14/14 0502  NA 127* 131*  K 5.2* 4.5  CL 95* 103  CO2 20 20  GLUCOSE 336* 235*  BUN 30* 21  CREATININE 1.69* 1.46*  CALCIUM 9.1 8.5   Liver Function Tests:  Recent Labs Lab 10/14/14 0502  AST 27  ALT 26  ALKPHOS 92  BILITOT 0.8  PROT 6.0  ALBUMIN 2.4*   CBC:  Recent Labs Lab 10/13/14 1653 10/14/14 0502  WBC 12.6* 9.3  NEUTROABS 10.4*  --   HGB 12.4* 10.9*  HCT 36.3* 32.6*  MCV 86.6 87.2  PLT 329 276   CBG:  Recent Labs Lab 10/13/14 1647 10/13/14 2135 10/14/14 0630  GLUCAP 341* 253* 211*    Studies/Results: Mr Foot Left Wo Contrast  10/14/2014   CLINICAL DATA:  Ulcer along the plantar aspect of the left foot. Worsening infection of the foot.  EXAM: MRI OF THE LEFT FOREFOOT WITHOUT CONTRAST  TECHNIQUE: Multiplanar, multisequence MR imaging was performed. No intravenous contrast was administered.  COMPARISON:  None.  FINDINGS: Soft tissue ulceration along the plantar aspect of the midfoot. There are a few locules of air within the wound.  There is moderate-severe osteoarthritis of the first MTP joint with subchondral  reactive marrow changes. There is mild osteoarthritis of the first IP joint. There is a periarticular erosion along the medial aspect of the distal first proximal phalanx as can be seen with crystalline arthropathy such as gout.  There is mild marrow edema in the medial cuneiform and middle cuneiform which is partially visualized and may be reactive secondary to osteoarthritis versus contusion. There is no other marrow signal abnormality. There is no fracture or dislocation.  There is no fluid collection or hematoma. There is fluid in the first intermetatarsal bursa. There is nonspecific soft tissue edema along the dorsal aspect of the midfoot and forefoot which may be reactive versus secondary to cellulitis.  There is increased signal within the musculature which is likely neurogenic.  IMPRESSION: 1. Soft tissue ulceration along the plantar aspect of the midfoot with a few locules of air within the wound. 2. No evidence of osteomyelitis of the left forefoot. 3. Moderate -severe osteoarthritis of the first MTP joint. Mild osteoarthritis of the first IP joint. Periarticular erosion along the medial aspect of the distal first proximal phalanx as can be seen with crystalline arthropathy such as gout. 4. Mild first intermetatarsal bursitis. 5. Mild marrow edema in the medial cuneiform and middle cuneiform which is partially visualized and may be reactive secondary to osteoarthritis versus contusion.   Electronically Signed   By: Elige Ko   On: 10/14/2014 08:21   Dg Foot Complete Left  10/13/2014   CLINICAL DATA:  Left foot wound with swelling and pain.  EXAM: LEFT FOOT - COMPLETE 3+ VIEW  COMPARISON:  10/03/2014  FINDINGS: Soft tissue swelling is noted.  There is no evidence of acute fracture, subluxation or dislocation.  There is mild cortical irregularity of the lateral first metatarsal head. Osteomyelitis is not excluded.  A calcaneal spur is present.  IMPRESSION: Mild cortical irregularity of the first metatarsal head -osteomyelitis is not excluded. Correlate with wound location.  Soft tissue swelling.   Electronically Signed   By: Harmon Pier M.D.   On: 10/13/2014 18:14    Medications:  Scheduled: . enoxaparin (LOVENOX) injection  30 mg Subcutaneous Q24H  . glimepiride  4 mg Oral BID  . insulin aspart  0-5 Units Subcutaneous QHS  . insulin aspart  0-9 Units Subcutaneous TID WC  . piperacillin-tazobactam (ZOSYN)  IV  3.375 g Intravenous Q8H   Continuous: . sodium chloride 75 mL/hr at 10/14/14 1007   ZOX:[id number] **OR** acetaminophen  Assessment/Plan:  Active Problems:   Diabetic ulcer of left foot   Diabetes   Anemia   CKD (chronic kidney disease) stage 3, GFR 30-59 ml/min    Hyperkalemia   Skin ulcer    Diabetic foot ulcer with yellowish drainage MRI does not show osteomyelitis. There is evidence for soft tissue ulceration with locules of air. Continue broad-spectrum antibiotics. Orthopedics has been consulted. Dr. Eulah Pont will discuss with Dr. Lajoyce Corners. Pain control as needed. Needs better control of his diabetes. May need to involve wound care, but will wait for orthopedics input first.  Diabetes mellitus type 2, poorly controlled With renal complications HbA1c is pending. He is on oral hypoglycemic agents alone. We will need to reevaluate his treatment strategy. Continue sliding scale coverage.  Dehydration with renal failure, likely acute on chronic stage III/hyponatremia Improved with IV fluids. Continue the same. Sodium levels have improved as well. Continue to check daily. Hyperkalemia has resolved.  Normocytic anemia Slight drop in hemoglobin noted, which is likely due to dilution. No overt bleeding identified. Continue  to monitor.   DVT Prophylaxis: Patient refused Lovenox. SCD's Code Status: full code  Family Communication: discussed with the patient  Disposition Plan: not ready for discharge    LOS: 1 day   [facility] At Gallow[facility] SouthKRISHNAN,Emagene Merfeld  [facility] Pager [phone] 10/14/2014, 8:45 AM  If 7PM-7AM, please contact night-coverage at [email or web address], password [email or web address]

## 2014-10-14 NOTE — Consult Note (Signed)
Reason for Consult: Abscess plantar aspect left foot Referring Physician: Dr. Evlyn Kanner Burmester is an 69 y.o. male.  HPI: Patient is a 69 year old gentleman who states he's had an ulcer on the plantar aspect of his left foot since December he states that this has healed he has been to [place] emergency room he is been to wound clinics he has been to podiatry and he presents at this time with increased redness swelling ulceration and drainage.  Past Medical History  Diagnosis Date  . Diabetes mellitus without complication   . Diabetic foot ulcer     left  . CKD (chronic kidney disease) stage 3, GFR 30-59 ml/min     Past Surgical History  Procedure Laterality Date  . Toe amputation      Family History  Problem Relation Age of Onset  . Dementia Mother     Social History:  reports that he has never smoked. He does not have any smokeless tobacco history on file. He reports that he drinks about 0.6 oz of alcohol per week. His drug history is not on file.  Allergies: No Known Allergies  Medications: I have reviewed the patient's current medications.  Results for orders placed or performed during the hospital encounter of 10/13/14 (from the past 48 hour(s))  POC CBG, ED     Status: Abnormal   Collection Time: 10/13/14  4:47 PM  Result Value Ref Range   Glucose-Capillary 341 (H) 70 - 99 mg/dL  CBC with Differential     Status: Abnormal   Collection Time: 10/13/14  4:53 PM  Result Value Ref Range   WBC 12.6 (H) 4.0 - 10.5 K/uL   RBC 4.19 (L) 4.22 - 5.81 MIL/uL   Hemoglobin 12.4 (L) 13.0 - 17.0 g/dL   HCT 36.3 (L) 39.0 - 52.0 %   MCV 86.6 78.0 - 100.0 fL   MCH 29.6 26.0 - 34.0 pg   MCHC 34.2 30.0 - 36.0 g/dL   RDW 12.9 11.5 - 15.5 %   Platelets 329 150 - 400 K/uL   Neutrophils Relative % 83 (H) 43 - 77 %   Neutro Abs 10.4 (H) 1.7 - 7.7 K/uL   Lymphocytes Relative 10 (L) 12 - 46 %   Lymphs Abs 1.3 0.7 - 4.0 K/uL   Monocytes Relative 7 3 - 12 %   Monocytes Absolute 0.9  0.1 - 1.0 K/uL   Eosinophils Relative 0 0 - 5 %   Eosinophils Absolute 0.0 0.0 - 0.7 K/uL   Basophils Relative 0 0 - 1 %   Basophils Absolute 0.1 0.0 - 0.1 K/uL  Basic metabolic panel     Status: Abnormal   Collection Time: 10/13/14  4:53 PM  Result Value Ref Range   Sodium 127 (L) 135 - 145 mmol/L   Potassium 5.2 (H) 3.5 - 5.1 mmol/L   Chloride 95 (L) 96 - 112 mmol/L   CO2 20 19 - 32 mmol/L   Glucose, Bld 336 (H) 70 - 99 mg/dL   BUN 30 (H) 6 - 23 mg/dL   Creatinine, Ser 1.69 (H) 0.50 - 1.35 mg/dL   Calcium 9.1 8.4 - 10.5 mg/dL   GFR calc non Af Amer 40 (L) >90 mL/min   GFR calc Af Amer 46 (L) >90 mL/min    Comment: (NOTE) The eGFR has been calculated using the CKD EPI equation. This calculation has not been validated in all clinical situations. eGFR's persistently <90 mL/min signify possible Chronic Kidney Disease.  Anion gap 12 5 - 15  Glucose, capillary     Status: Abnormal   Collection Time: 10/13/14  9:35 PM  Result Value Ref Range   Glucose-Capillary 253 (H) 70 - 99 mg/dL  Sedimentation rate     Status: Abnormal   Collection Time: 10/13/14  9:50 PM  Result Value Ref Range   Sed Rate 115 (H) 0 - 16 mm/hr  C-reactive protein     Status: Abnormal   Collection Time: 10/13/14  9:50 PM  Result Value Ref Range   CRP 21.9 (H) <0.60 mg/dL    Comment: Performed at [facility]  CBC     Status: Abnormal   Collection Time: 10/14/14  5:02 AM  Result Value Ref Range   WBC 9.3 4.0 - 10.5 K/uL   RBC 3.74 (L) 4.22 - 5.81 MIL/uL   Hemoglobin 10.9 (L) 13.0 - 17.0 g/dL   HCT 32.6 (L) 39.0 - 52.0 %   MCV 87.2 78.0 - 100.0 fL   MCH 29.1 26.0 - 34.0 pg   MCHC 33.4 30.0 - 36.0 g/dL   RDW 12.9 11.5 - 15.5 %   Platelets 276 150 - 400 K/uL  Comprehensive metabolic panel     Status: Abnormal   Collection Time: 10/14/14  5:02 AM  Result Value Ref Range   Sodium 131 (L) 135 - 145 mmol/L   Potassium 4.5 3.5 - 5.1 mmol/L   Chloride 103 96 - 112 mmol/L    Comment: DELTA CHECK  NOTED   CO2 20 19 - 32 mmol/L   Glucose, Bld 235 (H) 70 - 99 mg/dL   BUN 21 6 - 23 mg/dL   Creatinine, Ser 1.46 (H) 0.50 - 1.35 mg/dL   Calcium 8.5 8.4 - 10.5 mg/dL   Total Protein 6.0 6.0 - 8.3 g/dL   Albumin 2.4 (L) 3.5 - 5.2 g/dL   AST 27 0 - 37 U/L   ALT 26 0 - 53 U/L   Alkaline Phosphatase 92 39 - 117 U/L   Total Bilirubin 0.8 0.3 - 1.2 mg/dL   GFR calc non Af Amer 47 (L) >90 mL/min   GFR calc Af Amer 55 (L) >90 mL/min    Comment: (NOTE) The eGFR has been calculated using the CKD EPI equation. This calculation has not been validated in all clinical situations. eGFR's persistently <90 mL/min signify possible Chronic Kidney Disease.    Anion gap 8 5 - 15  Glucose, capillary     Status: Abnormal   Collection Time: 10/14/14  6:30 AM  Result Value Ref Range   Glucose-Capillary 211 (H) 70 - 99 mg/dL  Glucose, capillary     Status: Abnormal   Collection Time: 10/14/14 11:19 AM  Result Value Ref Range   Glucose-Capillary 259 (H) 70 - 99 mg/dL   Comment 1 Repeat Test    Comment 2 Document in Chart   Glucose, capillary     Status: Abnormal   Collection Time: 10/14/14  4:01 PM  Result Value Ref Range   Glucose-Capillary 336 (H) 70 - 99 mg/dL   Comment 1 Repeat Test    Comment 2 Document in Chart     Mr Foot Left Wo Contrast  10/14/2014   CLINICAL DATA:  Ulcer along the plantar aspect of the left foot. Worsening infection of the foot.  EXAM: MRI OF THE LEFT FOREFOOT WITHOUT CONTRAST  TECHNIQUE: Multiplanar, multisequence MR imaging was performed. No intravenous contrast was administered.  COMPARISON:  None.  FINDINGS: Soft tissue  ulceration along the plantar aspect of the midfoot. There are a few locules of air within the wound.  There is moderate-severe osteoarthritis of the first MTP joint with subchondral reactive marrow changes. There is mild osteoarthritis of the first IP joint. There is a periarticular erosion along the medial aspect of the distal first proximal phalanx as can  be seen with crystalline arthropathy such as gout.  There is mild marrow edema in the medial cuneiform and middle cuneiform which is partially visualized and may be reactive secondary to osteoarthritis versus contusion. There is no other marrow signal abnormality. There is no fracture or dislocation.  There is no fluid collection or hematoma. There is fluid in the first intermetatarsal bursa. There is nonspecific soft tissue edema along the dorsal aspect of the midfoot and forefoot which may be reactive versus secondary to cellulitis. There is increased signal within the musculature which is likely neurogenic.  IMPRESSION: 1. Soft tissue ulceration along the plantar aspect of the midfoot with a few locules of air within the wound. 2. No evidence of osteomyelitis of the left forefoot. 3. Moderate -severe osteoarthritis of the first MTP joint. Mild osteoarthritis of the first IP joint. Periarticular erosion along the medial aspect of the distal first proximal phalanx as can be seen with crystalline arthropathy such as gout. 4. Mild first intermetatarsal bursitis. 5. Mild marrow edema in the medial cuneiform and middle cuneiform which is partially visualized and may be reactive secondary to osteoarthritis versus contusion.   Electronically Signed   By: Kathreen Devoid   On: 10/14/2014 08:21   Dg Foot Complete Left  10/13/2014   CLINICAL DATA:  Left foot wound with swelling and pain.  EXAM: LEFT FOOT - COMPLETE 3+ VIEW  COMPARISON:  10/03/2014  FINDINGS: Soft tissue swelling is noted.  There is no evidence of acute fracture, subluxation or dislocation.  There is mild cortical irregularity of the lateral first metatarsal head. Osteomyelitis is not excluded.  A calcaneal spur is present.  IMPRESSION: Mild cortical irregularity of the first metatarsal head -osteomyelitis is not excluded. Correlate with wound location.  Soft tissue swelling.   Electronically Signed   By: Margarette Canada M.D.   On: 10/13/2014 18:14    Review  of Systems  All other systems reviewed and are negative.  Blood pressure 129/64, pulse 82, temperature 99.1 F (37.3 C), temperature source Oral, resp. rate 18, height $RemoveBe'6\' 2"'DVjKpoloA$  (1.88 m), weight 99.791 kg (220 lb), SpO2 98 %. Physical Exam On examination patient has a good dorsalis pedis pulse bilaterally. 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 Left foot does have redness and swelling he has cellulitis with linear ulceration of plantar aspect of the left foot. Review of the MRI scan does show a deep abscess does not show any osteomyelitis. There does appear to be periarticular cystic changes consistent with chronic gout. Patient states his most recent hemoglobin A1c was greater than 10. Review of his labs shows elevated renal function. Assessment/Plan: Assessment: Uncontrolled diabetic insensate neuropathy with ulceration abscess left foot.  Plan: Hemoglobin A1c and a uric acid level was ordered. We will have the patient continue with his IV vancomycin and will plan for surgery on Friday. The wounds are open and will decompress and feel that waiting will allow some marginal tissue to improve on the IV vancomycin. Patient may be in the hospital to 5 days postoperatively depending on whether we need to  use a wound VAC or not.  Saaya Procell V 10/14/2014, 5:55 PM

## 2014-10-15 DIAGNOSIS — L98499 Non-pressure chronic ulcer of skin of other sites with unspecified severity: Secondary | ICD-10-CM

## 2014-10-15 LAB — CBC
HCT: 32.2 % — ABNORMAL LOW (ref 39.0–52.0)
Hemoglobin: 10.4 g/dL — ABNORMAL LOW (ref 13.0–17.0)
MCH: 28.6 pg (ref 26.0–34.0)
MCHC: 32.3 g/dL (ref 30.0–36.0)
MCV: 88.5 fL (ref 78.0–100.0)
Platelets: 287 10*3/uL (ref 150–400)
RBC: 3.64 MIL/uL — ABNORMAL LOW (ref 4.22–5.81)
RDW: 12.9 % (ref 11.5–15.5)
WBC: 7.7 10*3/uL (ref 4.0–10.5)

## 2014-10-15 LAB — BASIC METABOLIC PANEL
Anion gap: 8 (ref 5–15)
BUN: 16 mg/dL (ref 6–23)
CHLORIDE: 105 mmol/L (ref 96–112)
CO2: 22 mmol/L (ref 19–32)
Calcium: 8.7 mg/dL (ref 8.4–10.5)
Creatinine, Ser: 1.36 mg/dL — ABNORMAL HIGH (ref 0.50–1.35)
GFR, EST AFRICAN AMERICAN: 60 mL/min — AB (ref >90)
GFR, EST NON AFRICAN AMERICAN: 52 mL/min — AB (ref >90)
Glucose, Bld: 216 mg/dL — ABNORMAL HIGH (ref 70–99)
POTASSIUM: 4.5 mmol/L (ref 3.5–5.1)
SODIUM: 135 mmol/L (ref 135–145)

## 2014-10-15 LAB — HEMOGLOBIN A1C
Hgb A1c MFr Bld: 10.6 % — ABNORMAL HIGH (ref 4.8–5.6)
Mean Plasma Glucose: 258 mg/dL

## 2014-10-15 LAB — GLUCOSE, CAPILLARY
GLUCOSE-CAPILLARY: 211 mg/dL — AB (ref 70–99)
GLUCOSE-CAPILLARY: 256 mg/dL — AB (ref 70–99)
Glucose-Capillary: 314 mg/dL — ABNORMAL HIGH (ref 70–99)
Glucose-Capillary: 282 mg/dL — ABNORMAL HIGH (ref 70–99)

## 2014-10-15 MED ORDER — INSULIN DETEMIR 100 UNIT/ML ~~LOC~~ SOLN
15.0000 [IU] | Freq: Every day | SUBCUTANEOUS | Status: DC
  Administered 2014-10-15 – 2014-10-16 (×2): 15 [IU] via SUBCUTANEOUS
  Filled 2014-10-15 (×3): qty 0.15

## 2014-10-15 MED ORDER — GLIMEPIRIDE 4 MG PO TABS
4.0000 mg | ORAL_TABLET | Freq: Two times a day (BID) | ORAL | Status: DC
  Administered 2014-10-15 – 2014-10-16 (×3): 4 mg via ORAL
  Filled 2014-10-15 (×3): qty 1

## 2014-10-15 NOTE — Progress Notes (Addendum)
[facility] PROGRESS NOTE  Kevin OlpGeorge Bradford AVW:[id number] DOB: [birth year] DOA: 10/13/2014  PCP: does not have a PCP here. [place]  Brief HPI: 69 year old Caucasian male with a past medical history of diabetes mellitus type 2, diabetic foot ulcer who presented with worsening drainage from an ulcer in his left foot. He was prescribed oral antibiotics a few days ago. He appears to have failed outpatient treatment.  Past medical history:  Past Medical History  Diagnosis Date  . Diabetes mellitus without complication   . Diabetic foot ulcer     left  . CKD (chronic kidney disease) stage 3, GFR 30-59 ml/min     Consultants: orthopedics  Procedures: none  Antibiotics: On vancomycin and Zosyn 3/28  Subjective: The patient has no new complaints.  Objective: Vital Signs  Filed Vitals:   10/14/14 1430 10/14/14 2100 10/15/14 0438 10/15/14 1400  BP: 129/64 135/71 120/66 132/71  Pulse: 82 89 76 79  Temp: 99.1 F (37.3 C) 98 F (36.7 C) 98.5 F (36.9 C) 98.3 F (36.8 C)  TempSrc:      Resp: 18 18 18 16   Height:      Weight:   100.5 kg (221 lb 9 oz)   SpO2: 98% 100% 100% 100%    Intake/Output Summary (Last 24 hours) at 10/15/14 1440 Last data filed at 10/15/14 1230  Gross per 24 hour  Intake   1545 ml  Output      0 ml  Net   1545 ml   Filed Weights   10/13/14 1834 10/15/14 0438  Weight: 99.791 kg (220 lb) 100.5 kg (221 lb 9 oz)    General appearance: alert, cooperative, appears stated age and no distress Resp: clear to auscultation bilaterally, no wheezes Cardio: regular rate and rhythm, S1, S2 normal, no murmur, click, rub or gallop GI: soft, non-tender; bowel sounds normal; no masses,  no organomegaly Extremities: plantar aspect of the left foot reveals one ulcer towards the toes, which does not have any drainage. Appears to be healing. There is ulcer in the mid aspect of the plantar area which is draining yellowish fluid. Tender to  palpation.  Lab Results:  Basic Metabolic Panel:  Recent Labs Lab 10/13/14 1653 10/14/14 0502 10/15/14 0436  NA 127* 131* 135  K 5.2* 4.5 4.5  CL 95* 103 105  CO2 20 20 22   GLUCOSE 336* 235* 216*  BUN 30* 21 16  CREATININE 1.69* 1.46* 1.36*  CALCIUM 9.1 8.5 8.7   Liver Function Tests:  Recent Labs Lab 10/14/14 0502  AST 27  ALT 26  ALKPHOS 92  BILITOT 0.8  PROT 6.0  ALBUMIN 2.4*   CBC:  Recent Labs Lab 10/13/14 1653 10/14/14 0502 10/15/14 0436  WBC 12.6* 9.3 7.7  NEUTROABS 10.4*  --   --   HGB 12.4* 10.9* 10.4*  HCT 36.3* 32.6* 32.2*  MCV 86.6 87.2 88.5  PLT 329 276 287   CBG:  Recent Labs Lab 10/14/14 1119 10/14/14 1601 10/14/14 2139 10/15/14 0631 10/15/14 1107  GLUCAP 259* 336* 210* 211* 314*    Studies/Results: Mr Foot Left Wo Contrast  10/14/2014   CLINICAL DATA:  Ulcer along the plantar aspect of the left foot. Worsening infection of the foot.  EXAM: MRI OF THE LEFT FOREFOOT WITHOUT CONTRAST  TECHNIQUE: Multiplanar, multisequence MR imaging was performed. No intravenous contrast was administered.  COMPARISON:  None.  FINDINGS: Soft tissue ulceration along the plantar aspect of the midfoot. There are a few  locules of air within the wound.  There is moderate-severe osteoarthritis of the first MTP joint with subchondral reactive marrow changes. There is mild osteoarthritis of the first IP joint. There is a periarticular erosion along the medial aspect of the distal first proximal phalanx as can be seen with crystalline arthropathy such as gout.  There is mild marrow edema in the medial cuneiform and middle cuneiform which is partially visualized and may be reactive secondary to osteoarthritis versus contusion. There is no other marrow signal abnormality. There is no fracture or dislocation.  There is no fluid collection or hematoma. There is fluid in the first intermetatarsal bursa. There is nonspecific soft tissue edema along the dorsal aspect of the  midfoot and forefoot which may be reactive versus secondary to cellulitis. There is increased signal within the musculature which is likely neurogenic.  IMPRESSION: 1. Soft tissue ulceration along the plantar aspect of the midfoot with a few locules of air within the wound. 2. No evidence of osteomyelitis of the left forefoot. 3. Moderate -severe osteoarthritis of the first MTP joint. Mild osteoarthritis of the first IP joint. Periarticular erosion along the medial aspect of the distal first proximal phalanx as can be seen with crystalline arthropathy such as gout. 4. Mild first intermetatarsal bursitis. 5. Mild marrow edema in the medial cuneiform and middle cuneiform which is partially visualized and may be reactive secondary to osteoarthritis versus contusion.   Electronically Signed   By: Elige Ko   On: 10/14/2014 08:21   Dg Foot Complete Left  10/13/2014   CLINICAL DATA:  Left foot wound with swelling and pain.  EXAM: LEFT FOOT - COMPLETE 3+ VIEW  COMPARISON:  10/03/2014  FINDINGS: Soft tissue swelling is noted.  There is no evidence of acute fracture, subluxation or dislocation.  There is mild cortical irregularity of the lateral first metatarsal head. Osteomyelitis is not excluded.  A calcaneal spur is present.  IMPRESSION: Mild cortical irregularity of the first metatarsal head -osteomyelitis is not excluded. Correlate with wound location.  Soft tissue swelling.   Electronically Signed   By: Harmon Pier M.D.   On: 10/13/2014 18:14    Medications:  Scheduled: . glimepiride  4 mg Oral BID  . insulin aspart  0-5 Units Subcutaneous QHS  . insulin aspart  0-9 Units Subcutaneous TID WC  . piperacillin-tazobactam (ZOSYN)  IV  3.375 g Intravenous Q8H  . vancomycin  750 mg Intravenous Q12H   Continuous:   ZOX:[id number] **OR** acetaminophen  Assessment/Plan:  Active Problems:   Diabetic ulcer of left foot   Diabetes   Anemia   CKD (chronic kidney disease) stage 3, GFR 30-59 ml/min    Hyperkalemia   Skin ulcer    Diabetic foot ulcer with yellowish drainage MRI does not show osteomyelitis. There is evidence for soft tissue ulceration with locules of air. Continue broad-spectrum antibiotics.  - Orthopedics has been consulted. .  - Continue Pain control as needed. Needs better control of his diabetes.   Diabetes mellitus type 2, poorly controlled With renal complications HbA1c is pending. He is on oral hypoglycemic agents alone. We will need to reevaluate his treatment strategy. Continue sliding scale coverage. - Addendum: Adding Levemir 15 units SQ  Dehydration with renal failure, likely acute on chronic stage III/hyponatremia Improving with IV fluids. Most likely prerenal  Normocytic anemia Slight drop in hemoglobin noted, which is likely due to dilution. No overt bleeding identified. Continue to monitor.   DVT Prophylaxis: Patient refused Lovenox. SCD's Code  Status: full code  Family Communication: discussed with the patient  Disposition Plan: Pending improvement in condition    LOS: 2 days   Kevin Bradford  [facility] Pager [phone]  10/15/2014, 2:40 PM  If 7PM-7AM, please contact night-coverage at [email or web address], password [facility]

## 2014-10-15 NOTE — Care Management Note (Signed)
Utilization Review completed   Yarnell Kozloski,RN, BSN,CCM 

## 2014-10-15 NOTE — Progress Notes (Signed)
Changed dressing on L foot. Moderate amount of drainage from site. Applied guaze and kerlex

## 2014-10-15 NOTE — Progress Notes (Signed)
Educated pt on how to administer insulin to himself. Pt stated he wanted staff to continue to administer his insulin. States that wife will be the one administering once he is discharged. Pt not interested in learning to do it himself.

## 2014-10-15 NOTE — Clinical Documentation Improvement (Signed)
MD's, NP's, and PA's   Documentation of "dehydration with renal failure" if appropriate for this admission please add the acuity to the diagnosis of renal failure. Thank you   Possible Clinical Conditions?   Acute Renal Failure  Acute on Chronic Renal Failure  Chronic Renal Failure  Other Condition  Cannot Clinically Determine        Risk Factors: dehydration, CKD 3,   DM 2  Signs and Symptoms:   BUN 30 (10/13/14)/ Creatnine 1.69 (10/13/14), 1.46 (10/14/14), 1.36 (10/15/14)  Diagnostics:BMET x 3  Treatments: NS @ 75 ml/hr IV  Thank You, Lavonda JumboLawanda J Grisell Bissette ,RN Clinical Documentation Specialist:  [phone]  [facility]- Health Information Management

## 2014-10-15 NOTE — Progress Notes (Addendum)
Inpatient Diabetes Program Recommendations  AACE/ADA: New Consensus Statement on Inpatient Glycemic Control (2013)  Target Ranges:  Prepandial:   less than 140 mg/dL      Peak postprandial:   less than 180 mg/dL (1-2 hours)      Critically ill patients:  140 - 180 mg/dL     Results for Kevin Bradford, Kohler (MRN [id number]) as of 10/15/2014 08:41  Ref. Range 10/13/2014 21:50  Hemoglobin A1C Latest Range: 4.8-5.6 % 10.6 (H)    Results for Kevin Bradford, [name] (MRN [id number]) as of 10/15/2014 08:41  Ref. Range 10/14/2014 06:30 10/14/2014 11:19 10/14/2014 16:01 10/14/2014 21:39  Glucose-Capillary Latest Range: 70-99 mg/dL 782211 (H) 956259 (H) 213336 (H) 210 (H)     Admit with: Diabetic Foot Ulcer  History: DM, CKD3  Home DM Meds: Amaryl 4 mg bid  Current DM Orders: Amaryl 4 mg bid            Novolog Sensitive SSI tid ac + HS    **Note plans to take patient to OR on Friday per Dr. Lajoyce Cornersuda.  **Glucose levels consistently >200 mg/dl yesterday.  **A1c shows poor control at home.  Needs tight glucose control to help with healing.    MD- Please consider the following:  1. Add basal insulin- Levemir 15 units QHS (0.15 units/kg)  2. Add Novolog Meal Coverage- Novolog 3 units tidwc (patient is eating 100% of meals)  3. Will patient need insulin for home?  Will go ahead and have RNs begin insulin teaching with patient in case decision made to send patient home on insulin.    Addendum 1500PM:  Spoke with patient this AM about his A1c.  Patient [place] with him to get his A1c down.  Explained to patient that it is imperative that we get his CBGs under control to help with healing for his foot.  Patient seemed very disinterested in speaking with me today.  Patient laid flat on the bed and never raised his head up during our conversation.  Had a very flat affect.  Reviewed patient's current A1c resulted with him and discussed the possibility that he may need  insulin for home.  Patient stated he doesn't really want to use insulin, however, he has taken insulin short-term before and would be willing to do it again if needed b/c his wife can give him shots at home.  Prefers to be discharged home on insulin pens if decision made to send him home on insulin.  Will call wife and make appointment with her for Friday (04/01) to re-educate wife on how to use insulin pens at home.    Will follow Ambrose FinlandJeannine Johnston Donavyn Fecher RN, MSN, CDE Diabetes Coordinator Inpatient Diabetes Program Team Pager: [phone] (8a-5p)

## 2014-10-16 LAB — WOUND CULTURE
Culture: NO GROWTH
SPECIAL REQUESTS: NORMAL

## 2014-10-16 LAB — GLUCOSE, CAPILLARY
GLUCOSE-CAPILLARY: 259 mg/dL — AB (ref 70–99)
GLUCOSE-CAPILLARY: 255 mg/dL — AB (ref 70–99)
GLUCOSE-CAPILLARY: 224 mg/dL — AB (ref 70–99)
Glucose-Capillary: 233 mg/dL — ABNORMAL HIGH (ref 70–99)

## 2014-10-16 LAB — SURGICAL PCR SCREEN
MRSA, PCR: NEGATIVE
Staphylococcus aureus: NEGATIVE

## 2014-10-16 LAB — HEMOGLOBIN A1C
Hgb A1c MFr Bld: 11.3 % — ABNORMAL HIGH (ref 4.8–5.6)
MEAN PLASMA GLUCOSE: 278 mg/dL

## 2014-10-16 LAB — VANCOMYCIN, TROUGH: Vancomycin Tr: 14.4 ug/mL (ref 10.0–20.0)

## 2014-10-16 MED ORDER — CHLORHEXIDINE GLUCONATE 4 % EX LIQD
60.0000 mL | Freq: Once | CUTANEOUS | Status: AC
Start: 2014-10-16 — End: 2014-10-17
  Administered 2014-10-17: 4 via TOPICAL
  Filled 2014-10-16: qty 60

## 2014-10-16 MED ORDER — INSULIN ASPART 100 UNIT/ML ~~LOC~~ SOLN
0.0000 [IU] | Freq: Three times a day (TID) | SUBCUTANEOUS | Status: DC
  Administered 2014-10-16 – 2014-10-17 (×2): 5 [IU] via SUBCUTANEOUS
  Administered 2014-10-18: 3 [IU] via SUBCUTANEOUS
  Administered 2014-10-18: 5 [IU] via SUBCUTANEOUS
  Administered 2014-10-18: 8 [IU] via SUBCUTANEOUS
  Administered 2014-10-19: 5 [IU] via SUBCUTANEOUS
  Administered 2014-10-19: 11 [IU] via SUBCUTANEOUS
  Administered 2014-10-19: 5 [IU] via SUBCUTANEOUS
  Administered 2014-10-20: 3 [IU] via SUBCUTANEOUS

## 2014-10-16 MED ORDER — INSULIN ASPART 100 UNIT/ML ~~LOC~~ SOLN: 0.0000 [IU] | Freq: Every day | SUBCUTANEOUS | Status: DC

## 2014-10-16 MED ORDER — INSULIN ASPART 100 UNIT/ML ~~LOC~~ SOLN
4.0000 [IU] | Freq: Three times a day (TID) | SUBCUTANEOUS | Status: DC
  Administered 2014-10-16 – 2014-10-19 (×7): 4 [IU] via SUBCUTANEOUS

## 2014-10-16 NOTE — Progress Notes (Signed)
Dressing removed from pt's left foot, noted moderate amount serosanguinous drainage.  New dressing applied with telfa, 4X4, and kerlex with tape.  Will continue to monitor.

## 2014-10-16 NOTE — Progress Notes (Signed)
ANTIBIOTIC CONSULT NOTE - FOLLOW UP  Pharmacy Consult for Vancomycin Indication: diabetic foot ulcer  No Known Allergies  Patient Measurements: Height:  (188 cm) Weight: 223 lb 12.3 oz (101.5 kg) IBW/kg (Calculated) : 82.2 Adjusted Body Weight:  Vital Signs: Temp: 98.7 F (37.1 C) (03/31 1207) Temp Source: Oral (03/31 1144) BP: 127/72 mmHg (03/31 1207) Pulse Rate: 71 (03/31 1207) Intake/Output from previous day: 03/30 0701 - 03/31 0700 In: 1160 [P.O.:960; IV Piggyback:200] Out: -  Intake/Output from this shift: Total I/O In: 680 [P.O.:480; IV Piggyback:200] Out: -   Labs:  Recent Labs  10/13/14 1653 10/14/14 0502 10/15/14 0436  WBC 12.6* 9.3 7.7  HGB 12.4* 10.9* 10.4*  PLT 329 276 287  CREATININE 1.69* 1.46* 1.36*   Estimated Creatinine Clearance: 65.2 mL/min (by C-G formula based on Cr of 1.36).  Recent Labs  10/16/14 1250  VANCOTROUGH 14.4     Microbiology: Recent Results (from the past 720 hour(s))  Culture, blood (routine x 2)     Status: None (Preliminary result)   Collection Time: 10/13/14  4:53 PM  Result Value Ref Range Status   Specimen Description BLOOD LEFT ARM  Final   Special Requests BOTTLES DRAWN AEROBIC AND ANAEROBIC 10CCS  Final   Culture   Final           BLOOD CULTURE RECEIVED NO GROWTH TO DATE CULTURE WILL BE HELD FOR 5 DAYS BEFORE ISSUING A FINAL NEGATIVE REPORT Note: Culture results may be compromised due to an excessive volume of blood received in culture bottles. Performed at [facility]    Report Status PENDING  Incomplete  Culture, blood (routine x 2)     Status: None (Preliminary result)   Collection Time: 10/13/14  8:52 PM  Result Value Ref Range Status   Specimen Description BLOOD RIGHT ARM  Final   Special Requests BOTTLES DRAWN AEROBIC AND ANAEROBIC 10CC EA  Final   Culture   Final           BLOOD CULTURE RECEIVED NO GROWTH TO DATE CULTURE WILL BE HELD FOR 5 DAYS BEFORE ISSUING A FINAL NEGATIVE  REPORT Note: Culture results may be compromised due to an excessive volume of blood received in culture bottles. Performed at [facility]    Report Status PENDING  Incomplete  Wound culture     Status: None   Collection Time: 10/14/14  6:13 AM  Result Value Ref Range Status   Specimen Description WOUND LEFT FOOT  Final   Special Requests Normal  Final   Gram Stain   Final    RARE WBC PRESENT, PREDOMINANTLY PMN NO SQUAMOUS EPITHELIAL CELLS SEEN NO ORGANISMS SEEN Performed at [facility]    Culture   Final    NO GROWTH 2 DAYS Performed at [facility]    Report Status 10/16/2014 FINAL  Final  Surgical pcr screen     Status: None   Collection Time: 10/16/14  9:54 AM  Result Value Ref Range Status   MRSA, PCR NEGATIVE NEGATIVE Final   Staphylococcus aureus NEGATIVE NEGATIVE Final    Comment:        The Xpert SA Assay (FDA approved for NASAL specimens in patients over 55 years of age), is one component of a comprehensive surveillance program.  Test performance has been validated by [facility] for patients greater than or equal to 49 year old. It is not intended to diagnose infection nor to guide or monitor treatment.  Anti-infectives    Start     Dose/Rate Route Frequency Ordered Stop   10/14/14 1400  vancomycin (VANCOCIN) IVPB 750 mg/150 ml premix     750 mg 150 mL/hr over 60 Minutes Intravenous Every 12 hours 10/14/14 1328     10/14/14 0600  piperacillin-tazobactam (ZOSYN) IVPB 3.375 g     3.375 g 12.5 mL/hr over 240 Minutes Intravenous Every 8 hours 10/13/14 2029     10/13/14 2100  piperacillin-tazobactam (ZOSYN) IVPB 3.375 g  Status:  Discontinued     3.375 g 12.5 mL/hr over 240 Minutes Intravenous Every 8 hours 10/13/14 2028 10/13/14 2029   10/13/14 2045  piperacillin-tazobactam (ZOSYN) IVPB 3.375 g     3.375 g 100 mL/hr over 30 Minutes Intravenous  Once 10/13/14 2030 10/14/14 0107   10/13/14 1900  vancomycin (VANCOCIN) 2,500 mg in  sodium chloride 0.9 % 500 mL IVPB     2,500 mg 250 mL/hr over 120 Minutes Intravenous  Once 10/13/14 1855 10/13/14 2247   10/13/14 1900  ciprofloxacin (CIPRO) IVPB 400 mg  Status:  Discontinued     400 mg 200 mL/hr over 60 Minutes Intravenous Every 12 hours 10/13/14 1858 10/13/14 2019      Assessment: CC: [id number] YOM with h/o of DM presented for evaluation of chronic diabetic foot ulcer. He was recently seen in the ED but declined admission and was sent home on clindamycin and bactrim, but the wound has worsened.   ID: diabetic foot ulcer, MRI neg for osteo, WBC down to 7.7, to OR on Fri and may place wound VAC  Vanc 3/28>> Zosyn 3/29>>  3/29 L foot - negative 3/28 BCx2 - NGTD  CV: VSS  Endo: DM (A1c 10.6), CBGs remain >200 on glimepiride, SSI, Levemir added.  GI/Nutrition: LFTs WNL  Renal: CKD, stage III, SCr down to 1.36, Lytes now ok.  Heme/Onc: Hb 10.4 < 12.4, plt WNL  PTA Med Issues: Best Practices: enox 40  Goal of Therapy:  Vancomycin trough level 10-15 mcg/ml  Plan:  Continue Vanco 750mg  IV q12h Zosyn 3.375g IV q8hr doseok. Surgery tomorrow.   Casady Voshell S. Merilynn Finlandobertson, PharmD, BCPS Clinical Staff Pharmacist Pager [phone]  Misty Stanleyobertson, Liyana Suniga Stillinger 10/16/2014,2:55 PM

## 2014-10-16 NOTE — Progress Notes (Signed)
[facility] PROGRESS NOTE  Nain Rudd ZOX:[id number] DOB: 1946/01/03 DOA: 10/13/2014 PCP: No primary care provider on file.  Brief narrative 69 year old male with history of uncontrolled type 2 diabetes mellitus with diabetic foot ulcer over the plantar aspect of his left foot presenting with pain and worsening drainage from the ulcer. He was prescribed oral antibiotics as outpatient 2 days back without improvement. He should admitted and placed on empiric IV antibiotics. MRI of the foot without any abscess or osteomyelitis. Orthopedic surgery consulted.   Assessment/Plan: Diabetic foot ulcer MRI negative for abscess or ostiomyelitis. Shows soft tissue ulceration with locules of air. On empiric vancomycin and Zosyn. Orthopedic surgery consulted. Pain control as needed. Plan on surgery on 4/1. Further management post surgery. Needs strict blood glucose control. A1c of 11.3. - placed on Levemir 15 units daily at bedtime . On Amaryl at home which will be continued . Continue with sliding scale insulin. Since patient will be nothing by mouth will not increase his Levemir tonight but add pre-meal aspart 4 units 3 times a day with meals. Hold a.m. dose of Amaryl. -Appreciate diabetic coordinator evaluation and recommendation.  Chronic kidney disease stage III secondary to diabetic nephropathy Renal function currently stable.  Anemia Check stool  for occult blood and iron panel  DVT prophylaxis: Subcutaneous Lovenox Diet: Diabetic. Nothing by mouth after midnight  Code Status: Full code Family Communication: None at bedside Disposition Plan: Currently inpatient.   Consultants:  Orthopedics (Dr. Lajoyce Corners)  Procedures:  140 are on 4/1  Antibiotics:  IV vancomycin and Zosyn since 3/28  HPI/Subjective: Patient seen and examined. Denies pain over left foot.  Objective: Filed Vitals:   10/16/14 1207  BP: 127/72  Pulse: 71  Temp: 98.7 F (37.1 C)  Resp: 16     Intake/Output Summary (Last 24 hours) at 10/16/14 1404 Last data filed at 10/16/14 1248  Gross per 24 hour  Intake    960 ml  Output      0 ml  Net    960 ml   Filed Weights   10/13/14 1834 10/15/14 0438 10/16/14 0500  Weight: 99.791 kg (220 lb) 100.5 kg (221 lb 9 oz) 101.5 kg (223 lb 12.3 oz)    Exam:   General: Elderly male lying in bed in no acute distress  HEENT: Pallor present, moist oral mucosa, supple neck  Chest: Clear to auscultation bilaterally, no added sounds  CVS: Normal S1 and S2, no murmurs rub or gallop  GI: Soft, nondistended, nontender, bowel sounds present  Musculoskeletal: Warm, no edema, ulcerations over plantar of the left foot with mild swelling and foul smell, distal pulses palpable.  CNS: Alert and oriented   Data Reviewed: Basic Metabolic Panel:  Recent Labs Lab 10/13/14 1653 10/14/14 0502 10/15/14 0436  NA 127* 131* 135  K 5.2* 4.5 4.5  CL 95* 103 105  CO2 GLUCOSE 336* 235* 216*  BUN 30* 21 16  CREATININE 1.69* 1.46* 1.36*  CALCIUM 9.1 8.5 8.7   Liver Function Tests:  Recent Labs Lab 10/14/14 0502  AST 27  ALT 26  ALKPHOS 92  BILITOT 0.8  PROT 6.0  ALBUMIN 2.4*   No results for input(s): LIPASE, AMYLASE in the last 168 hours. No results for input(s): AMMONIA in the last 168 hours. CBC:  Recent Labs Lab 10/13/14 1653 10/14/14 0502 10/15/14 0436  WBC 12.6* 9.3 7.7  NEUTROABS 10.4*  --   --   HGB 12.4* 10.9* 10.4*  HCT 36.3*  32.6* 32.2*  MCV 86.6 87.2 88.5  PLT 329 276 287   Cardiac Enzymes: No results for input(s): CKTOTAL, CKMB, CKMBINDEX, TROPONINI in the last 168 hours. BNP (last 3 results) No results for input(s): BNP in the last 8760 hours.  ProBNP (last 3 results) No results for input(s): PROBNP in the last 8760 hours.  CBG:  Recent Labs Lab 10/15/14 1107 10/15/14 1554 10/15/14 2114 10/16/14 0609 10/16/14 1119  GLUCAP 314* 282* 256* 259* 255*    Recent Results (from the past  240 hour(s))  Culture, blood (routine x 2)     Status: None (Preliminary result)   Collection Time: 10/13/14  4:53 PM  Result Value Ref Range Status   Specimen Description BLOOD LEFT ARM  Final   Special Requests BOTTLES DRAWN AEROBIC AND ANAEROBIC 10CCS  Final   Culture   Final           BLOOD CULTURE RECEIVED NO GROWTH TO DATE CULTURE WILL BE HELD FOR 5 DAYS BEFORE ISSUING A FINAL NEGATIVE REPORT Note: Culture results may be compromised due to an excessive volume of blood received in culture bottles. Performed at [facility]    Report Status PENDING  Incomplete  Culture, blood (routine x 2)     Status: None (Preliminary result)   Collection Time: 10/13/14  8:52 PM  Result Value Ref Range Status   Specimen Description BLOOD RIGHT ARM  Final   Special Requests BOTTLES DRAWN AEROBIC AND ANAEROBIC 10CC EA  Final   Culture   Final           BLOOD CULTURE RECEIVED NO GROWTH TO DATE CULTURE WILL BE HELD FOR 5 DAYS BEFORE ISSUING A FINAL NEGATIVE REPORT Note: Culture results may be compromised due to an excessive volume of blood received in culture bottles. Performed at [facility]    Report Status PENDING  Incomplete  Wound culture     Status: None   Collection Time: 10/14/14  6:13 AM  Result Value Ref Range Status   Specimen Description WOUND LEFT FOOT  Final   Special Requests Normal  Final   Gram Stain   Final    RARE WBC PRESENT, PREDOMINANTLY PMN NO SQUAMOUS EPITHELIAL CELLS SEEN NO ORGANISMS SEEN Performed at [facility]    Culture   Final    NO GROWTH 2 DAYS Performed at [facility]    Report Status 10/16/2014 FINAL  Final  Surgical pcr screen     Status: None   Collection Time: 10/16/14  9:54 AM  Result Value Ref Range Status   MRSA, PCR NEGATIVE NEGATIVE Final   Staphylococcus aureus NEGATIVE NEGATIVE Final    Comment:        The Xpert SA Assay (FDA approved for NASAL specimens in patients over 57 years of age), is one component  of a comprehensive surveillance program.  Test performance has been validated by [facility] for patients greater than or equal to 19 year old. It is not intended to diagnose infection nor to guide or monitor treatment.      Studies: No results found.  Scheduled Meds: . chlorhexidine  60 mL Topical Once  . glimepiride  4 mg Oral BID WC  . insulin aspart  0-5 Units Subcutaneous QHS  . insulin aspart  0-9 Units Subcutaneous TID WC  . insulin detemir  15 Units Subcutaneous Daily  . piperacillin-tazobactam (ZOSYN)  IV  3.375 g Intravenous Q8H  . vancomycin  750 mg Intravenous Q12H  Continuous Infusions:     Time spent: 25 minutes    Jionni Helming  [facility] [phone] If 7PM-7AM, please contact night-coverage at [email or web address], password [email or web address] 10/16/2014, 2:04 PM  LOS: 3 days

## 2014-10-16 NOTE — Progress Notes (Signed)
Inpatient Diabetes Program Recommendations  AACE/ADA: New Consensus Statement on Inpatient Glycemic Control (2013)  Target Ranges:  Prepandial:   less than 140 mg/dL      Peak postprandial:   less than 180 mg/dL (1-2 hours)      Critically ill patients:  140 - 180 mg/dL   Reason for Assessment:  CBG's continue to be greater than goal. Results for Kevin Bradford, Kevin Bradford (MRN [id number]) as of 10/16/2014 13:41  Ref. Range 10/15/2014 11:07 10/15/2014 15:54 10/15/2014 21:14 10/16/2014 06:09 10/16/2014 11:19  Glucose-Capillary Latest Range: 70-99 mg/dL 657314 (H) 846282 (H) 962256 (H) 259 (H) 255 (H)   Diabetes history: Type 2 diabetes Outpatient Diabetes medications: Amaryl 4 mg bid Current orders for Inpatient glycemic control:  Levemir 15 units daily, Novolog sensitive tid with meals and HS  Consider increasing Levemir to 20 units daily and add Novolog meal coverage 3 units tid with meals.  Thanks, Beryl MeagerJenny Levin Dagostino, RN, BC-ADM Inpatient Diabetes Coordinator Pager [phone] (8a-5p)

## 2014-10-17 ENCOUNTER — Encounter (HOSPITAL_COMMUNITY): Payer: Self-pay | Admitting: Certified Registered Nurse Anesthetist

## 2014-10-17 ENCOUNTER — Encounter (HOSPITAL_COMMUNITY)
Admission: EM | Disposition: A | Discharge status: Home / Self Care | Payer: Self-pay | Source: Home / Self Care | Attending: Internal Medicine

## 2014-10-17 ENCOUNTER — Inpatient Hospital Stay (HOSPITAL_COMMUNITY): Payer: Medicare Other | Admitting: Certified Registered Nurse Anesthetist

## 2014-10-17 DIAGNOSIS — E1121 Type 2 diabetes mellitus with diabetic nephropathy: Secondary | ICD-10-CM

## 2014-10-17 DIAGNOSIS — E1129 Type 2 diabetes mellitus with other diabetic kidney complication: Secondary | ICD-10-CM

## 2014-10-17 HISTORY — PX: I & D EXTREMITY: SHX5045

## 2014-10-17 LAB — IRON AND TIBC
IRON: 18 ug/dL — AB (ref 42–165)
Saturation Ratios: 8 % — ABNORMAL LOW (ref 20–55)
TIBC: 240 ug/dL (ref 215–435)
UIBC: 222 ug/dL (ref 125–400)

## 2014-10-17 LAB — GLUCOSE, CAPILLARY
Glucose-Capillary: 204 mg/dL — ABNORMAL HIGH (ref 70–99)
Glucose-Capillary: 169 mg/dL — ABNORMAL HIGH (ref 70–99)
Glucose-Capillary: 176 mg/dL — ABNORMAL HIGH (ref 70–99)
Glucose-Capillary: 166 mg/dL — ABNORMAL HIGH (ref 70–99)
Glucose-Capillary: 182 mg/dL — ABNORMAL HIGH (ref 70–99)

## 2014-10-17 SURGERY — IRRIGATION AND DEBRIDEMENT EXTREMITY
Anesthesia: General | Laterality: Left

## 2014-10-17 MED ORDER — METOCLOPRAMIDE HCL 5 MG/ML IJ SOLN: 5.0000 mg | Freq: Three times a day (TID) | INTRAMUSCULAR | Status: DC | PRN

## 2014-10-17 MED ORDER — SODIUM CHLORIDE 0.9 % IV SOLN: INTRAVENOUS | Status: DC

## 2014-10-17 MED ORDER — PROPOFOL 10 MG/ML IV BOLUS
INTRAVENOUS | Status: DC | PRN
  Administered 2014-10-17: 180 mg via INTRAVENOUS

## 2014-10-17 MED ORDER — ONDANSETRON HCL 4 MG/2ML IJ SOLN: 4.0000 mg | Freq: Four times a day (QID) | INTRAMUSCULAR | Status: DC | PRN

## 2014-10-17 MED ORDER — OXYCODONE HCL 5 MG PO TABS
5.0000 mg | ORAL_TABLET | ORAL | Status: DC | PRN
  Administered 2014-10-18 – 2014-10-19 (×3): 5 mg via ORAL
  Administered 2014-10-20: 10 mg via ORAL
  Filled 2014-10-17: qty 2
  Filled 2014-10-17 (×3): qty 1

## 2014-10-17 MED ORDER — INSULIN STARTER KIT- PEN NEEDLES (ENGLISH)
1.0000 | Freq: Once | Status: AC
  Administered 2014-10-17: 1
  Filled 2014-10-17: qty 1

## 2014-10-17 MED ORDER — HYDROMORPHONE HCL 1 MG/ML IJ SOLN: 1.0000 mg | INTRAMUSCULAR | Status: DC | PRN

## 2014-10-17 MED ORDER — MEPERIDINE HCL 25 MG/ML IJ SOLN: 6.2500 mg | INTRAMUSCULAR | Status: DC | PRN

## 2014-10-17 MED ORDER — HYDROMORPHONE HCL 1 MG/ML IJ SOLN
0.2500 mg | INTRAMUSCULAR | Status: DC | PRN
Start: 2014-10-17 — End: 2014-10-17

## 2014-10-17 MED ORDER — FENTANYL CITRATE 0.05 MG/ML IJ SOLN
INTRAMUSCULAR | Status: DC | PRN
Start: 2014-10-17 — End: 2014-10-17
  Administered 2014-10-17: 50 ug via INTRAVENOUS

## 2014-10-17 MED ORDER — ONDANSETRON HCL 4 MG PO TABS: 4.0000 mg | ORAL_TABLET | Freq: Four times a day (QID) | ORAL | Status: DC | PRN

## 2014-10-17 MED ORDER — MIDAZOLAM HCL 2 MG/2ML IJ SOLN
INTRAMUSCULAR | Status: AC
  Filled 2014-10-17: qty 2

## 2014-10-17 MED ORDER — MIDAZOLAM HCL 5 MG/5ML IJ SOLN
INTRAMUSCULAR | Status: DC | PRN
  Administered 2014-10-17 (×2): 1 mg via INTRAVENOUS

## 2014-10-17 MED ORDER — METHOCARBAMOL 1000 MG/10ML IJ SOLN
500.0000 mg | Freq: Four times a day (QID) | INTRAMUSCULAR | Status: DC | PRN
  Filled 2014-10-17: qty 5

## 2014-10-17 MED ORDER — ACETAMINOPHEN 650 MG RE SUPP: 650.0000 mg | Freq: Four times a day (QID) | RECTAL | Status: DC | PRN

## 2014-10-17 MED ORDER — OXYCODONE HCL 5 MG PO TABS
5.0000 mg | ORAL_TABLET | Freq: Once | ORAL | Status: AC | PRN
  Administered 2014-10-17: 5 mg via ORAL

## 2014-10-17 MED ORDER — 0.9 % SODIUM CHLORIDE (POUR BTL) OPTIME
TOPICAL | Status: DC | PRN
  Administered 2014-10-17: 1000 mL

## 2014-10-17 MED ORDER — METHOCARBAMOL 500 MG PO TABS: 500.0000 mg | ORAL_TABLET | Freq: Four times a day (QID) | ORAL | Status: DC | PRN

## 2014-10-17 MED ORDER — ACETAMINOPHEN 325 MG PO TABS: 650.0000 mg | ORAL_TABLET | Freq: Four times a day (QID) | ORAL | Status: DC | PRN

## 2014-10-17 MED ORDER — OXYCODONE HCL 5 MG/5ML PO SOLN: 5.0000 mg | Freq: Once | ORAL | Status: AC | PRN

## 2014-10-17 MED ORDER — SODIUM CHLORIDE 0.9 % IV SOLN
INTRAVENOUS | Status: DC | PRN
  Administered 2014-10-17: 18:00:00 via INTRAVENOUS

## 2014-10-17 MED ORDER — SODIUM CHLORIDE 0.9 % IV SOLN
INTRAVENOUS | Status: DC
  Administered 2014-10-17: 18:00:00 via INTRAVENOUS

## 2014-10-17 MED ORDER — LIDOCAINE HCL (CARDIAC) 20 MG/ML IV SOLN
INTRAVENOUS | Status: DC | PRN
  Administered 2014-10-17: 50 mg via INTRAVENOUS

## 2014-10-17 MED ORDER — PROMETHAZINE HCL 25 MG/ML IJ SOLN: 6.2500 mg | INTRAMUSCULAR | Status: DC | PRN

## 2014-10-17 MED ORDER — METOCLOPRAMIDE HCL 5 MG PO TABS: 5.0000 mg | ORAL_TABLET | Freq: Three times a day (TID) | ORAL | Status: DC | PRN

## 2014-10-17 MED ORDER — FENTANYL CITRATE 0.05 MG/ML IJ SOLN
INTRAMUSCULAR | Status: AC
  Filled 2014-10-17: qty 5

## 2014-10-17 MED ORDER — OXYCODONE HCL 5 MG PO TABS
ORAL_TABLET | ORAL | Status: AC
  Filled 2014-10-17: qty 1

## 2014-10-17 MED ORDER — INSULIN DETEMIR 100 UNIT/ML ~~LOC~~ SOLN
20.0000 [IU] | Freq: Every day | SUBCUTANEOUS | Status: DC
  Administered 2014-10-17: 20 [IU] via SUBCUTANEOUS
  Filled 2014-10-17 (×3): qty 0.2

## 2014-10-17 SURGICAL SUPPLY — 41 items
BLADE SURG 10 STRL SS (BLADE) IMPLANT
BLADE SURG 21 STRL SS (BLADE) ×3 IMPLANT
BNDG COHESIVE 4X5 TAN STRL (GAUZE/BANDAGES/DRESSINGS) ×3 IMPLANT
BNDG COHESIVE 6X5 TAN STRL LF (GAUZE/BANDAGES/DRESSINGS) IMPLANT
BNDG GAUZE ELAST 4 BULKY (GAUZE/BANDAGES/DRESSINGS) ×6 IMPLANT
COVER SURGICAL LIGHT HANDLE (MISCELLANEOUS) ×3 IMPLANT
DRAPE U-SHAPE 47X51 STRL (DRAPES) ×3 IMPLANT
DRSG ADAPTIC 3X8 NADH LF (GAUZE/BANDAGES/DRESSINGS) ×3 IMPLANT
DRSG PAD ABDOMINAL 8X10 ST (GAUZE/BANDAGES/DRESSINGS) ×6 IMPLANT
DURAPREP 26ML APPLICATOR (WOUND CARE) ×3 IMPLANT
ELECT CAUTERY BLADE 6.4 (BLADE) IMPLANT
ELECT REM PT RETURN 9FT ADLT (ELECTROSURGICAL)
ELECTRODE REM PT RTRN 9FT ADLT (ELECTROSURGICAL) IMPLANT
GAUZE SPONGE 4X4 12PLY STRL (GAUZE/BANDAGES/DRESSINGS) ×3 IMPLANT
GLOVE BIOGEL PI IND STRL 9 (GLOVE) ×1 IMPLANT
GLOVE BIOGEL PI INDICATOR 9 (GLOVE) ×2
GLOVE SURG ORTHO 9.0 STRL STRW (GLOVE) ×3 IMPLANT
GOWN STRL REUS W/ TWL LRG LVL3 (GOWN DISPOSABLE) ×1 IMPLANT
GOWN STRL REUS W/ TWL XL LVL3 (GOWN DISPOSABLE) ×2 IMPLANT
GOWN STRL REUS W/TWL LRG LVL3 (GOWN DISPOSABLE) ×2
GOWN STRL REUS W/TWL XL LVL3 (GOWN DISPOSABLE) ×4
HANDPIECE INTERPULSE COAX TIP (DISPOSABLE)
KIT BASIN OR (CUSTOM PROCEDURE TRAY) ×3 IMPLANT
KIT ROOM TURNOVER OR (KITS) ×3 IMPLANT
MANIFOLD NEPTUNE II (INSTRUMENTS) ×3 IMPLANT
NS IRRIG 1000ML POUR BTL (IV SOLUTION) ×3 IMPLANT
PACK ORTHO EXTREMITY (CUSTOM PROCEDURE TRAY) ×3 IMPLANT
PAD ARMBOARD 7.5X6 YLW CONV (MISCELLANEOUS) ×6 IMPLANT
SET HNDPC FAN SPRY TIP SCT (DISPOSABLE) IMPLANT
SPONGE GAUZE 4X4 12PLY STER LF (GAUZE/BANDAGES/DRESSINGS) ×3 IMPLANT
STOCKINETTE IMPERVIOUS 9X36 MD (GAUZE/BANDAGES/DRESSINGS) IMPLANT
SUT ETHILON 2 0 PSLX (SUTURE) ×3 IMPLANT
SWAB COLLECTION DEVICE MRSA (MISCELLANEOUS) ×3 IMPLANT
TOWEL OR 17X24 6PK STRL BLUE (TOWEL DISPOSABLE) ×3 IMPLANT
TOWEL OR 17X26 10 PK STRL BLUE (TOWEL DISPOSABLE) ×3 IMPLANT
TUBE ANAEROBIC SPECIMEN COL (MISCELLANEOUS) IMPLANT
TUBE CONNECTING 12'X1/4 (SUCTIONS) ×1
TUBE CONNECTING 12X1/4 (SUCTIONS) ×2 IMPLANT
UNDERPAD 30X30 INCONTINENT (UNDERPADS AND DIAPERS) ×3 IMPLANT
WATER STERILE IRR 1000ML POUR (IV SOLUTION) ×3 IMPLANT
YANKAUER SUCT BULB TIP NO VENT (SUCTIONS) ×3 IMPLANT

## 2014-10-17 NOTE — Progress Notes (Signed)
Inpatient Diabetes Program Recommendations  AACE/ADA: New Consensus Statement on Inpatient Glycemic Control (2013)  Target Ranges:  Prepandial:   less than 140 mg/dL      Peak postprandial:   less than 180 mg/dL (1-2 hours)      Critically ill patients:  140 - 180 mg/dL     Results for Kevin Bradford, Kamarri (MRN [id number]) as of 10/17/2014 08:43  Ref. Range 10/16/2014 06:09 10/16/2014 11:19 10/16/2014 16:06 10/16/2014 21:32  Glucose-Capillary Latest Range: 70-99 mg/dL 213259 (H) 086255 (H) 578233 (H) 224 (H)    Results for Kevin Bradford, [name] (MRN [id number]) as of 10/17/2014 08:43  Ref. Range 10/17/2014 06:48  Glucose-Capillary Latest Range: 70-99 mg/dL 413204 (H)    Results for Kevin Bradford, [name] (MRN [id number]) as of 10/17/2014 08:43  Ref. Range 10/13/2014 21:50 10/14/2014 19:11  Hemoglobin A1C Latest Range: 4.8-5.6 % 10.6 (H) 11.3 (H)    Home DM Meds: Amaryl 4 mg bid  Current DM Orders: Levemir 15 units daily            Amaryl 4 mg bid            Novolog Moderate SSI tid ac + HS            Novolog 4 units tidwc    **Note Novolog 4 units meal coverage started yesterday afternoon.  **Also note that Novolog SSI increased to Moderate scale yesterday.  **Patient scheduled for I&D today with Dr. Lajoyce Cornersuda.     MD- Please consider increasing Levemir to 20 units daily     Will follow Ambrose FinlandJeannine Johnston Susan Bleich RN, MSN, CDE Diabetes Coordinator Inpatient Diabetes Program Team Pager: [phone] (8a-5p)

## 2014-10-17 NOTE — Interval H&P Note (Signed)
History and Physical Interval Note:  10/17/2014 6:48 AM  Kevin Bradford  has presented today for surgery, with the diagnosis of Infected left foot  The various methods of treatment have been discussed with the patient and family. After consideration of risks, benefits and other options for treatment, the patient has consented to  Procedure(s): IRRIGATION AND DEBRIDEMENT EXTREMITY LEFT FOOT (Left) as a surgical intervention .  The patient's history has been reviewed, patient examined, no change in status, stable for surgery.  I have reviewed the patient's chart and labs.  Questions were answered to the patient's satisfaction.     Maurita Havener V

## 2014-10-17 NOTE — Progress Notes (Signed)
Results for MAYCOL, HOYING (MRN [id number]) as of 10/17/2014 10:23  Ref. Range 10/14/2014 19:11  Hemoglobin A1C Latest Range: 4.8-5.6 % 11.3 (H)    Met with patient's wife to teach insulin pen.  Pt took insulin short term in past so wife familiar with process but needed refresher.  Steps for insulin administration via pen reviewed verbally with wife and she successfully return demonstrated pen injections twice.  Discussed long versus short term insulin, proper storage of pens, and disposal of needles.  A1c normal value and patient's value mentioned again for this meeting.  Insulin starter kit ordered for written instructions per wife request.  Pt's wife says she is out of town 3 days per week so pt will need to give himself insulin injections.  Pt asked several appropriate questions but did not participate in pen return demonstration today.  Will need to verbalize or demonstrate understanding of insulin pen use prior to discharge.  Outpatient education encouraged for group or 1:1 teaching.  Barnabas Lister, RN, Administrator MSN Education Student         Wyn Quaker RN, MSN, CDE Diabetes Coordinator Inpatient Diabetes Program Team Pager: [phone] (8a-5p)

## 2014-10-17 NOTE — Anesthesia Preprocedure Evaluation (Addendum)
Anesthesia Evaluation  Patient identified by MRN, date of birth, ID band Patient awake    Reviewed: Allergy & Precautions, NPO status , Patient's Chart, lab work & pertinent test results  Airway Mallampati: II  TM Distance: >3 FB Neck ROM: Full    Dental no notable dental hx.    Pulmonary neg pulmonary ROS,  breath sounds clear to auscultation  Pulmonary exam normal       Cardiovascular negative cardio ROS  Rhythm:Regular Rate:Normal     Neuro/Psych negative neurological ROS  negative psych ROS   GI/Hepatic negative GI ROS, Neg liver ROS,   Endo/Other  negative endocrine ROSdiabetes, Type 2, Oral Hypoglycemic Agents  Renal/GU CRFRenal disease     Musculoskeletal negative musculoskeletal ROS (+)   Abdominal   Peds  Hematology negative hematology ROS (+) anemia ,   Anesthesia Other Findings   Reproductive/Obstetrics negative OB ROS                            Anesthesia Physical Anesthesia Plan  ASA: III  Anesthesia Plan: General   Post-op Pain Management:    Induction: Intravenous  Airway Management Planned: LMA  Additional Equipment: None  Intra-op Plan:   Post-operative Plan: Extubation in OR  Informed Consent: I have reviewed the patients History and Physical, chart, labs and discussed the procedure including the risks, benefits and alternatives for the proposed anesthesia with the patient or authorized representative who has indicated his/her understanding and acceptance.   Dental advisory given  Plan Discussed with: CRNA  Anesthesia Plan Comments:         Anesthesia Quick Evaluation

## 2014-10-17 NOTE — Op Note (Signed)
10/13/2014 - 10/17/2014  7:38 PM  PATIENT:  Kevin Bradford    PRE-OPERATIVE DIAGNOSIS:  Infected left foot  POST-OPERATIVE DIAGNOSIS:  Same  PROCEDURE:  Excisional DEBRIDEMENT EXTREMITY LEFT FOOT Local tissue rearrangement for a wound 7 x 3 cm.  SURGEON:  Nadara MustardUDA,Kobi Mario V, MD  PHYSICIAN ASSISTANT:None ANESTHESIA:   General  PREOPERATIVE INDICATIONS:  Kevin OlpGeorge Bruno is a  69 y.o. male with a diagnosis of Infected left foot who failed conservative measures and elected for surgical management.    The risks benefits and alternatives were discussed with the patient preoperatively including but not limited to the risks of infection, bleeding, nerve injury, cardiopulmonary complications, the need for revision surgery, among others, and the patient was willing to proceed.  OPERATIVE IMPLANTS: None  OPERATIVE FINDINGS: Large deep abscess,   tissue sent for cultures.  OPERATIVE PROCEDURE: Patient was brought to the operating room and underwent a general anesthetic. After adequate levels of anesthesia were obtained patient's left lower extremity was prepped using DuraPrep draped into a sterile field. A timeout was called. Patient had 2 ulcers 1 in the midfoot and one in the forefoot on the plantar aspect of his foot. An elliptical incision was made to encompass both ulcerative areas to ellipsed these out with the soft tissue. Patient had a large deep abscess which extended proximally 7 cm. The abscess necrotic flexor tendons and muscle was excised in 1 block of tissue. The wound was irrigated with normal saline. This was debrided back to healthy viable muscle. Muscle was excised tendon was excised and skin and soft tissue was excised both with a knife and Ronjair. After hemostasis was obtained the local tissue rearrangement rearrangement was performed to close the wound 7 x 3 cm. The wound approximated well. A sterile compressive dressing was applied. Patient was extubated taken to the PACU in stable  condition.

## 2014-10-17 NOTE — H&P (View-Only) (Signed)
Reason for Consult: Abscess plantar aspect left foot Referring Physician: Dr. Evlyn Kanner Burmester is an 69 y.o. male.  HPI: Patient is a 69 year old gentleman who states he's had an ulcer on the plantar aspect of his left foot since December he states that this has healed he has been to [place] emergency room he is been to wound clinics he has been to podiatry and he presents at this time with increased redness swelling ulceration and drainage.  Past Medical History  Diagnosis Date  . Diabetes mellitus without complication   . Diabetic foot ulcer     left  . CKD (chronic kidney disease) stage 3, GFR 30-59 ml/min     Past Surgical History  Procedure Laterality Date  . Toe amputation      Family History  Problem Relation Age of Onset  . Dementia Mother     Social History:  reports that he has never smoked. He does not have any smokeless tobacco history on file. He reports that he drinks about 0.6 oz of alcohol per week. His drug history is not on file.  Allergies: No Known Allergies  Medications: I have reviewed the patient's current medications.  Results for orders placed or performed during the hospital encounter of 10/13/14 (from the past 48 hour(s))  POC CBG, ED     Status: Abnormal   Collection Time: 10/13/14  4:47 PM  Result Value Ref Range   Glucose-Capillary 341 (H) 70 - 99 mg/dL  CBC with Differential     Status: Abnormal   Collection Time: 10/13/14  4:53 PM  Result Value Ref Range   WBC 12.6 (H) 4.0 - 10.5 K/uL   RBC 4.19 (L) 4.22 - 5.81 MIL/uL   Hemoglobin 12.4 (L) 13.0 - 17.0 g/dL   HCT 36.3 (L) 39.0 - 52.0 %   MCV 86.6 78.0 - 100.0 fL   MCH 29.6 26.0 - 34.0 pg   MCHC 34.2 30.0 - 36.0 g/dL   RDW 12.9 11.5 - 15.5 %   Platelets 329 150 - 400 K/uL   Neutrophils Relative % 83 (H) 43 - 77 %   Neutro Abs 10.4 (H) 1.7 - 7.7 K/uL   Lymphocytes Relative 10 (L) 12 - 46 %   Lymphs Abs 1.3 0.7 - 4.0 K/uL   Monocytes Relative 7 3 - 12 %   Monocytes Absolute 0.9  0.1 - 1.0 K/uL   Eosinophils Relative 0 0 - 5 %   Eosinophils Absolute 0.0 0.0 - 0.7 K/uL   Basophils Relative 0 0 - 1 %   Basophils Absolute 0.1 0.0 - 0.1 K/uL  Basic metabolic panel     Status: Abnormal   Collection Time: 10/13/14  4:53 PM  Result Value Ref Range   Sodium 127 (L) 135 - 145 mmol/L   Potassium 5.2 (H) 3.5 - 5.1 mmol/L   Chloride 95 (L) 96 - 112 mmol/L   CO2 20 19 - 32 mmol/L   Glucose, Bld 336 (H) 70 - 99 mg/dL   BUN 30 (H) 6 - 23 mg/dL   Creatinine, Ser 1.69 (H) 0.50 - 1.35 mg/dL   Calcium 9.1 8.4 - 10.5 mg/dL   GFR calc non Af Amer 40 (L) >90 mL/min   GFR calc Af Amer 46 (L) >90 mL/min    Comment: (NOTE) The eGFR has been calculated using the CKD EPI equation. This calculation has not been validated in all clinical situations. eGFR's persistently <90 mL/min signify possible Chronic Kidney Disease.  Anion gap 12 5 - 15  Glucose, capillary     Status: Abnormal   Collection Time: 10/13/14  9:35 PM  Result Value Ref Range   Glucose-Capillary 253 (H) 70 - 99 mg/dL  Sedimentation rate     Status: Abnormal   Collection Time: 10/13/14  9:50 PM  Result Value Ref Range   Sed Rate 115 (H) 0 - 16 mm/hr  C-reactive protein     Status: Abnormal   Collection Time: 10/13/14  9:50 PM  Result Value Ref Range   CRP 21.9 (H) <0.60 mg/dL    Comment: Performed at [facility]  CBC     Status: Abnormal   Collection Time: 10/14/14  5:02 AM  Result Value Ref Range   WBC 9.3 4.0 - 10.5 K/uL   RBC 3.74 (L) 4.22 - 5.81 MIL/uL   Hemoglobin 10.9 (L) 13.0 - 17.0 g/dL   HCT 32.6 (L) 39.0 - 52.0 %   MCV 87.2 78.0 - 100.0 fL   MCH 29.1 26.0 - 34.0 pg   MCHC 33.4 30.0 - 36.0 g/dL   RDW 12.9 11.5 - 15.5 %   Platelets 276 150 - 400 K/uL  Comprehensive metabolic panel     Status: Abnormal   Collection Time: 10/14/14  5:02 AM  Result Value Ref Range   Sodium 131 (L) 135 - 145 mmol/L   Potassium 4.5 3.5 - 5.1 mmol/L   Chloride 103 96 - 112 mmol/L    Comment: DELTA CHECK  NOTED   CO2 20 19 - 32 mmol/L   Glucose, Bld 235 (H) 70 - 99 mg/dL   BUN 21 6 - 23 mg/dL   Creatinine, Ser 1.46 (H) 0.50 - 1.35 mg/dL   Calcium 8.5 8.4 - 10.5 mg/dL   Total Protein 6.0 6.0 - 8.3 g/dL   Albumin 2.4 (L) 3.5 - 5.2 g/dL   AST 27 0 - 37 U/L   ALT 26 0 - 53 U/L   Alkaline Phosphatase 92 39 - 117 U/L   Total Bilirubin 0.8 0.3 - 1.2 mg/dL   GFR calc non Af Amer 47 (L) >90 mL/min   GFR calc Af Amer 55 (L) >90 mL/min    Comment: (NOTE) The eGFR has been calculated using the CKD EPI equation. This calculation has not been validated in all clinical situations. eGFR's persistently <90 mL/min signify possible Chronic Kidney Disease.    Anion gap 8 5 - 15  Glucose, capillary     Status: Abnormal   Collection Time: 10/14/14  6:30 AM  Result Value Ref Range   Glucose-Capillary 211 (H) 70 - 99 mg/dL  Glucose, capillary     Status: Abnormal   Collection Time: 10/14/14 11:19 AM  Result Value Ref Range   Glucose-Capillary 259 (H) 70 - 99 mg/dL   Comment 1 Repeat Test    Comment 2 Document in Chart   Glucose, capillary     Status: Abnormal   Collection Time: 10/14/14  4:01 PM  Result Value Ref Range   Glucose-Capillary 336 (H) 70 - 99 mg/dL   Comment 1 Repeat Test    Comment 2 Document in Chart     Mr Foot Left Wo Contrast  10/14/2014   CLINICAL DATA:  Ulcer along the plantar aspect of the left foot. Worsening infection of the foot.  EXAM: MRI OF THE LEFT FOREFOOT WITHOUT CONTRAST  TECHNIQUE: Multiplanar, multisequence MR imaging was performed. No intravenous contrast was administered.  COMPARISON:  None.  FINDINGS: Soft tissue  ulceration along the plantar aspect of the midfoot. There are a few locules of air within the wound.  There is moderate-severe osteoarthritis of the first MTP joint with subchondral reactive marrow changes. There is mild osteoarthritis of the first IP joint. There is a periarticular erosion along the medial aspect of the distal first proximal phalanx as can  be seen with crystalline arthropathy such as gout.  There is mild marrow edema in the medial cuneiform and middle cuneiform which is partially visualized and may be reactive secondary to osteoarthritis versus contusion. There is no other marrow signal abnormality. There is no fracture or dislocation.  There is no fluid collection or hematoma. There is fluid in the first intermetatarsal bursa. There is nonspecific soft tissue edema along the dorsal aspect of the midfoot and forefoot which may be reactive versus secondary to cellulitis. There is increased signal within the musculature which is likely neurogenic.  IMPRESSION: 1. Soft tissue ulceration along the plantar aspect of the midfoot with a few locules of air within the wound. 2. No evidence of osteomyelitis of the left forefoot. 3. Moderate -severe osteoarthritis of the first MTP joint. Mild osteoarthritis of the first IP joint. Periarticular erosion along the medial aspect of the distal first proximal phalanx as can be seen with crystalline arthropathy such as gout. 4. Mild first intermetatarsal bursitis. 5. Mild marrow edema in the medial cuneiform and middle cuneiform which is partially visualized and may be reactive secondary to osteoarthritis versus contusion.   Electronically Signed   By: Kathreen Devoid   On: 10/14/2014 08:21   Dg Foot Complete Left  10/13/2014   CLINICAL DATA:  Left foot wound with swelling and pain.  EXAM: LEFT FOOT - COMPLETE 3+ VIEW  COMPARISON:  10/03/2014  FINDINGS: Soft tissue swelling is noted.  There is no evidence of acute fracture, subluxation or dislocation.  There is mild cortical irregularity of the lateral first metatarsal head. Osteomyelitis is not excluded.  A calcaneal spur is present.  IMPRESSION: Mild cortical irregularity of the first metatarsal head -osteomyelitis is not excluded. Correlate with wound location.  Soft tissue swelling.   Electronically Signed   By: Margarette Canada M.D.   On: 10/13/2014 18:14    Review  of Systems  All other systems reviewed and are negative.  Blood pressure 129/64, pulse 82, temperature 99.1 F (37.3 C), temperature source Oral, resp. rate 18, height $RemoveBe'6\' 2"'wRVAFgIAk$  (1.88 m), weight 99.791 kg (220 lb), SpO2 98 %. Physical Exam On examination patient has a good dorsalis pedis pulse bilaterally. 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 Left foot does have redness and swelling he has cellulitis with linear ulceration of plantar aspect of the left foot. Review of the MRI scan does show a deep abscess does not show any osteomyelitis. There does appear to be periarticular cystic changes consistent with chronic gout. Patient states his most recent hemoglobin A1c was greater than 10. Review of his labs shows elevated renal function. Assessment/Plan: Assessment: Uncontrolled diabetic insensate neuropathy with ulceration abscess left foot.  Plan: Hemoglobin A1c and a uric acid level was ordered. We will have the patient continue with his IV vancomycin and will plan for surgery on Friday. The wounds are open and will decompress and feel that waiting will allow some marginal tissue to improve on the IV vancomycin. Patient may be in the hospital to 5 days postoperatively depending on whether we need to  use a wound VAC or not.  DUDA,MARCUS V 10/14/2014, 5:55 PM

## 2014-10-17 NOTE — Anesthesia Postprocedure Evaluation (Signed)
Anesthesia Post Note  Patient: Kevin OlpGeorge Bradford  Procedure(s) Performed: Procedure(s) (LRB): IRRIGATION AND DEBRIDEMENT EXTREMITY LEFT FOOT (Left)  Anesthesia type: General  Patient location: PACU  Post pain: Pain level controlled  Post assessment: Post-op Vital signs reviewed  Last Vitals: BP 142/68 mmHg  Pulse 66  Temp(Src) 36.6 C (Oral)  Resp 13  Ht 6\' 2"  (1.88 m)  Wt 220 lb 14.4 oz (100.2 kg)  BMI 28.35 kg/m2  SpO2 100%  Post vital signs: Reviewed  Level of consciousness: sedated  Complications: No apparent anesthesia complications

## 2014-10-17 NOTE — Progress Notes (Signed)
Necrotic abscess tissue was sent for cultures. Wound margins were clean after debridement. Anticipate patient could be discharged on oral antibiotics Monday. Continue IV antibiotics through the weekend.

## 2014-10-17 NOTE — Transfer of Care (Signed)
Immediate Anesthesia Transfer of Care Note  Patient: Kevin Bradford  Procedure(s) Performed: Procedure(s): IRRIGATION AND DEBRIDEMENT EXTREMITY LEFT FOOT (Left)  Patient Location: PACU  Anesthesia Type:General  Level of Consciousness: awake and alert   Airway & Oxygen Therapy: Patient Spontanous Breathing and Patient connected to nasal cannula oxygen  Post-op Assessment: Report given to RN and Post -op Vital signs reviewed and stable  Post vital signs: Reviewed and stable  Last Vitals:  Filed Vitals:   10/17/14 1705  BP: 146/77  Pulse: 66  Temp: 36.9 C  Resp: 16    Complications: No apparent anesthesia complications

## 2014-10-17 NOTE — Progress Notes (Signed)
PROGRESS NOTE  Kevin Bradford WVP:[id number] DOB: 1946/07/02 DOA: 10/13/2014 PCP: No primary care provider on file.  Brief narrative 69 year old male with history of uncontrolled type 2 diabetes mellitus with diabetic foot ulcer over the plantar aspect of his left foot presenting with pain and worsening drainage from the ulcer. He was prescribed oral antibiotics as outpatient 2 days back without improvement. He should admitted and placed on empiric IV antibiotics. MRI of the foot without any abscess or osteomyelitis. Orthopedic surgery consulted.   Assessment/Plan: Diabetic foot ulcer -MRI foot negative for abscess or osteomyelitis. Shows soft tissue ulceration with locules of air.  -On empiric vancomycin and Zosyn pending culture data -Orthopedic surgery consulted.  -Plan on surgery on 4/1--please send any intraoperative cultures -wound care management post surgery. Needs strict blood glucose control.  -A1c of 11.3. -Increase Levemir 20 units daily at bedtime .  -d/c Amaryl --will need insulin at time of d/c--pt is agreeable -Continue with sliding scale insulin.  -add pre-meal aspart 4 units 3 times a day with meals. Hold a.m. dose of Amaryl. -Appreciate diabetic coordinator evaluation and recommendation. -ESR, CRP  Chronic kidney disease stage III secondary to diabetic nephropathy Renal function currently stable.  Anemia of chronic disease with component of iron deficiency Check stool for occult blood -iron saturation--8% -give IV iron and start po iron--start 4/2 if stable  Diabetes Mellitus type 2, uncontrolled -A1c of 11.3. -Increase Levemir 20 units daily at bedtime .  -d/c Amaryl --will need insulin at time of d/c--pt is agreeable -Continue with sliding scale insulin.  -add pre-meal aspart 4 units 3 times a day with meals. Hold a.m. dose of Amaryl. -Appreciate diabetic coordinator evaluation and recommendation. -lipid panel in am  DVT prophylaxis:  Subcutaneous Lovenox Diet: Diabetic. Nothing by mouth after midnight  Code Status: Full code Family Communication: wife updated at bedside Disposition Plan: Currently inpatient.    Procedures/Studies: Mr Foot Left Wo Contrast  10/14/2014   CLINICAL DATA:  Ulcer along the plantar aspect of the left foot. Worsening infection of the foot.  EXAM: MRI OF THE LEFT FOREFOOT WITHOUT CONTRAST  TECHNIQUE: Multiplanar, multisequence MR imaging was performed. No intravenous contrast was administered.  COMPARISON:  None.  FINDINGS: Soft tissue ulceration along the plantar aspect of the midfoot. There are a few locules of air within the wound.  There is moderate-severe osteoarthritis of the first MTP joint with subchondral reactive marrow changes. There is mild osteoarthritis of the first IP joint. There is a periarticular erosion along the medial aspect of the distal first proximal phalanx as can be seen with crystalline arthropathy such as gout.  There is mild marrow edema in the medial cuneiform and middle cuneiform which is partially visualized and may be reactive secondary to osteoarthritis versus contusion. There is no other marrow signal abnormality. There is no fracture or dislocation.  There is no fluid collection or hematoma. There is fluid in the first intermetatarsal bursa. There is nonspecific soft tissue edema along the dorsal aspect of the midfoot and forefoot which may be reactive versus secondary to cellulitis. There is increased signal within the musculature which is likely neurogenic.  IMPRESSION: 1. Soft tissue ulceration along the plantar aspect of the midfoot with a few locules of air within the wound. 2. No evidence of osteomyelitis of the left forefoot. 3. Moderate -severe osteoarthritis of the first MTP joint. Mild osteoarthritis of the first IP joint. Periarticular erosion along the medial aspect of  the distal first proximal phalanx as can be seen with crystalline arthropathy such as gout. 4.  Mild first intermetatarsal bursitis. 5. Mild marrow edema in the medial cuneiform and middle cuneiform which is partially visualized and may be reactive secondary to osteoarthritis versus contusion.   Electronically Signed   By: Kathreen Devoid   On: 10/14/2014 08:21   Dg Foot Complete Left  10/13/2014   CLINICAL DATA:  Left foot wound with swelling and pain.  EXAM: LEFT FOOT - COMPLETE 3+ VIEW  COMPARISON:  10/03/2014  FINDINGS: Soft tissue swelling is noted.  There is no evidence of acute fracture, subluxation or dislocation.  There is mild cortical irregularity of the lateral first metatarsal head. Osteomyelitis is not excluded.  A calcaneal spur is present.  IMPRESSION: Mild cortical irregularity of the first metatarsal head -osteomyelitis is not excluded. Correlate with wound location.  Soft tissue swelling.   Electronically Signed   By: Margarette Canada M.D.   On: 10/13/2014 18:14   Dg Foot Complete Left  10/03/2014   CLINICAL DATA:  Chronic nonhealing wound at the plantar surface of the foot at the level of the distal third and fourth metatarsals. Diabetic.  EXAM: LEFT FOOT - COMPLETE 3+ VIEW  COMPARISON:  None.  FINDINGS: Lisfranc joint intact. No displaced acute fracture or dislocation. No aggressive osseous lesion. There is soft tissue defect/ ulceration along the plantar surface of the foot at the level of the proximal interphalangeal/MTP joints. No radiopaque foreign body. Posterior and plantar calcaneal enthesophytes. First MTP DJD. Mild atherosclerotic vascular calcifications. Mild calcific densities adjacent to the proximal second and third metatarsal shaft may reflect vascular or other soft tissue calcifications.  IMPRESSION: Plantar wound/ulceration overlying the MTP joints. No acute osseous finding.  Recommend MRI if concern for osteomyelitis persists.   Electronically Signed   By: Carlos Levering M.D.   On: 10/03/2014 20:51         Subjective: Patient denies fevers, chills, headache, chest  pain, dyspnea, nausea, vomiting, diarrhea, abdominal pain, dysuria, hematuria   Objective: Filed Vitals:   10/17/14 0517 10/17/14 1441 10/17/14 1447 10/17/14 1705  BP: 135/75 103/48 127/72 146/77  Pulse: 73 68  66  Temp: 98.7 F (37.1 C) 98.6 F (37 C)  98.5 F (36.9 C)  TempSrc:  Oral  Oral  Resp: _0 Height:      Weight: 100.2 kg (220 lb 14.4 oz)     SpO2: 98% 97%  99%    Intake/Output Summary (Last 24 hours) at 10/17/14 1730 Last data filed at 10/17/14 0849  Gross per 24 hour  Intake      0 ml  Output      0 ml  Net      0 ml   Weight change: -1.3 kg (-2 lb 13.9 oz) Exam:   General:  Pt is alert, follows commands appropriately, not in acute distress  HEENT: No icterus, No thrush, No neck mass, [place]/AT  Cardiovascular: RRR, S1/S2, no rubs, no gallops  Respiratory: CTA bilaterally, no wheezing, no crackles, no rhonchi  Abdomen: Soft/+BS, non tender, non distended, no guarding Extremities: L plantar with superficial ulcer with purulent drainage expressed--no crepitance or necrosis  Data Reviewed: Basic Metabolic Panel:  Recent Labs Lab 10/13/14 1653 10/14/14 0502 10/15/14 0436  NA 127* 131* 135  K 5.2* 4.5 4.5  CL 95* 103 105  CO2 _1 GLUCOSE 336* 235* 216*  BUN 30* 21 16  CREATININE 1.69* 1.46*  1.36*  CALCIUM 9.1 8.5 8.7   Liver Function Tests:  Recent Labs Lab 10/14/14 0502  AST 27  ALT 26  ALKPHOS 92  BILITOT 0.8  PROT 6.0  ALBUMIN 2.4*   No results for input(s): LIPASE, AMYLASE in the last 168 hours. No results for input(s): AMMONIA in the last 168 hours. CBC:  Recent Labs Lab 10/13/14 1653 10/14/14 0502 10/15/14 0436  WBC 12.6* 9.3 7.7  NEUTROABS 10.4*  --   --   HGB 12.4* 10.9* 10.4*  HCT 36.3* 32.6* 32.2*  MCV 86.6 87.2 88.5  PLT 329 276 287   Cardiac Enzymes: No results for input(s): CKTOTAL, CKMB, CKMBINDEX, TROPONINI in the last 168 hours. BNP: Invalid input(s): POCBNP CBG:  Recent Labs Lab  10/16/14 1606 10/16/14 2132 10/17/14 0648 10/17/14 1226 10/17/14 1619  GLUCAP 233* 224* 204* 169* 176*    Recent Results (from the past 240 hour(s))  Culture, blood (routine x 2)     Status: None (Preliminary result)   Collection Time: 10/13/14  4:53 PM  Result Value Ref Range Status   Specimen Description BLOOD LEFT ARM  Final   Special Requests BOTTLES DRAWN AEROBIC AND ANAEROBIC 10CCS  Final   Culture   Final           BLOOD CULTURE RECEIVED NO GROWTH TO DATE CULTURE WILL BE HELD FOR 5 DAYS BEFORE ISSUING A FINAL NEGATIVE REPORT Note: Culture results may be compromised due to an excessive volume of blood received in culture bottles. Performed at [facility]    Report Status PENDING  Incomplete  Culture, blood (routine x 2)     Status: None (Preliminary result)   Collection Time: 10/13/14  8:52 PM  Result Value Ref Range Status   Specimen Description BLOOD RIGHT ARM  Final   Special Requests BOTTLES DRAWN AEROBIC AND ANAEROBIC 10CC EA  Final   Culture   Final           BLOOD CULTURE RECEIVED NO GROWTH TO DATE CULTURE WILL BE HELD FOR 5 DAYS BEFORE ISSUING A FINAL NEGATIVE REPORT Note: Culture results may be compromised due to an excessive volume of blood received in culture bottles. Performed at [facility]    Report Status PENDING  Incomplete  Wound culture     Status: None   Collection Time: 10/14/14  6:13 AM  Result Value Ref Range Status   Specimen Description WOUND LEFT FOOT  Final   Special Requests Normal  Final   Gram Stain   Final    RARE WBC PRESENT, PREDOMINANTLY PMN NO SQUAMOUS EPITHELIAL CELLS SEEN NO ORGANISMS SEEN Performed at [facility]    Culture   Final    NO GROWTH 2 DAYS Performed at [facility]    Report Status 10/16/2014 FINAL  Final  Surgical pcr screen     Status: None   Collection Time: 10/16/14  9:54 AM  Result Value Ref Range Status   MRSA, PCR NEGATIVE NEGATIVE Final   Staphylococcus aureus NEGATIVE  NEGATIVE Final    Comment:        The Xpert SA Assay (FDA approved for NASAL specimens in patients over 66 years of age), is one component of a comprehensive surveillance program.  Test performance has been validated by [facility] for patients greater than or equal to 56 year old. It is not intended to diagnose infection nor to guide or monitor treatment.      Scheduled Meds: . [MAR Hold] glimepiride  4 mg Oral BID WC  . [MAR Hold] insulin aspart  0-15 Units Subcutaneous TID WC  . [MAR Hold] insulin aspart  0-5 Units Subcutaneous QHS  . [MAR Hold] insulin aspart  4 Units Subcutaneous TID WC  . [MAR Hold] insulin detemir  15 Units Subcutaneous Daily  . insulin starter kit- pen needles  1 kit Other Once  . [MAR Hold] piperacillin-tazobactam (ZOSYN)  IV  3.375 g Intravenous Q8H  . [MAR Hold] vancomycin  750 mg Intravenous Q12H   Continuous Infusions: . sodium chloride       Kyrstal Monterrosa, DO  [facility] Pager [phone]  If 7PM-7AM, please contact night-coverage [email or web address] Password [email or web address] 10/17/2014, 5:30 PM   LOS: 4 days

## 2014-10-17 NOTE — Anesthesia Procedure Notes (Signed)
Procedure Name: LMA Insertion Date/Time: 10/17/2014 6:25 PM Performed by: Kizzie FantasiaARVER, Tripton Ned J Pre-anesthesia Checklist: Patient identified, Emergency Drugs available, Suction available, Patient being monitored and Timeout performed Patient Re-evaluated:Patient Re-evaluated prior to inductionOxygen Delivery Method: Circle system utilized Preoxygenation: Pre-oxygenation with 100% oxygen Intubation Type: IV induction LMA: LMA inserted LMA Size: 4.0 Number of attempts: 1 Placement Confirmation: positive ETCO2 Tube secured with: Tape Dental Injury: Teeth and Oropharynx as per pre-operative assessment

## 2014-10-18 DIAGNOSIS — E11621 Type 2 diabetes mellitus with foot ulcer: Secondary | ICD-10-CM | POA: Insufficient documentation

## 2014-10-18 DIAGNOSIS — L03119 Cellulitis of unspecified part of limb: Secondary | ICD-10-CM

## 2014-10-18 DIAGNOSIS — L02419 Cutaneous abscess of limb, unspecified: Secondary | ICD-10-CM

## 2014-10-18 DIAGNOSIS — L97529 Non-pressure chronic ulcer of other part of left foot with unspecified severity: Secondary | ICD-10-CM

## 2014-10-18 HISTORY — DX: Cellulitis of unspecified part of limb: L03.119

## 2014-10-18 HISTORY — DX: Cutaneous abscess of limb, unspecified: L02.419

## 2014-10-18 LAB — CBC
HEMATOCRIT: 33.8 % — AB (ref 39.0–52.0)
HEMOGLOBIN: 11 g/dL — AB (ref 13.0–17.0)
MCH: 28.8 pg (ref 26.0–34.0)
MCHC: 32.5 g/dL (ref 30.0–36.0)
MCV: 88.5 fL (ref 78.0–100.0)
Platelets: 330 10*3/uL (ref 150–400)
RBC: 3.82 MIL/uL — ABNORMAL LOW (ref 4.22–5.81)
RDW: 12.7 % (ref 11.5–15.5)
WBC: 7 10*3/uL (ref 4.0–10.5)

## 2014-10-18 LAB — LIPID PANEL
CHOL/HDL RATIO: 6.9 ratio
Cholesterol: 145 mg/dL (ref 0–200)
HDL: 21 mg/dL — ABNORMAL LOW (ref >39)
LDL Cholesterol: 91 mg/dL (ref 0–99)
Triglycerides: 166 mg/dL — ABNORMAL HIGH (ref <150)
VLDL: 33 mg/dL (ref 0–40)

## 2014-10-18 LAB — BASIC METABOLIC PANEL
Anion gap: 2 — ABNORMAL LOW (ref 5–15)
BUN: 13 mg/dL (ref 6–23)
CO2: 30 mmol/L (ref 19–32)
Calcium: 8.7 mg/dL (ref 8.4–10.5)
Chloride: 99 mmol/L (ref 96–112)
Creatinine, Ser: 1.38 mg/dL — ABNORMAL HIGH (ref 0.50–1.35)
GFR calc Af Amer: 59 mL/min — ABNORMAL LOW (ref >90)
GFR calc non Af Amer: 51 mL/min — ABNORMAL LOW (ref >90)
GLUCOSE: 294 mg/dL — AB (ref 70–99)
POTASSIUM: 4.3 mmol/L (ref 3.5–5.1)
Sodium: 131 mmol/L — ABNORMAL LOW (ref 135–145)

## 2014-10-18 LAB — GLUCOSE, CAPILLARY
GLUCOSE-CAPILLARY: 198 mg/dL — AB (ref 70–99)
Glucose-Capillary: 255 mg/dL — ABNORMAL HIGH (ref 70–99)
Glucose-Capillary: 228 mg/dL — ABNORMAL HIGH (ref 70–99)
Glucose-Capillary: 241 mg/dL — ABNORMAL HIGH (ref 70–99)

## 2014-10-18 LAB — SEDIMENTATION RATE: Sed Rate: 119 mm/hr — ABNORMAL HIGH (ref 0–16)

## 2014-10-18 LAB — VANCOMYCIN, TROUGH: Vancomycin Tr: 14.2 ug/mL (ref 10.0–20.0)

## 2014-10-18 LAB — C-REACTIVE PROTEIN: CRP: 8.5 mg/dL — AB (ref <0.60)

## 2014-10-18 MED ORDER — GLUCERNA SHAKE PO LIQD
237.0000 mL | Freq: Two times a day (BID) | ORAL | Status: DC
Start: 2014-10-18 — End: 2014-10-20
  Administered 2014-10-18: 237 mL via ORAL

## 2014-10-18 MED ORDER — INSULIN DETEMIR 100 UNIT/ML ~~LOC~~ SOLN
30.0000 [IU] | Freq: Every day | SUBCUTANEOUS | Status: DC
  Administered 2014-10-18: 30 [IU] via SUBCUTANEOUS
  Filled 2014-10-18 (×2): qty 0.3

## 2014-10-18 MED ORDER — FERROUS SULFATE 325 (65 FE) MG PO TABS
325.0000 mg | ORAL_TABLET | Freq: Two times a day (BID) | ORAL | Status: DC
  Administered 2014-10-19 – 2014-10-20 (×2): 325 mg via ORAL
  Filled 2014-10-18 (×2): qty 1

## 2014-10-18 MED ORDER — NA FERRIC GLUC CPLX IN SUCROSE 12.5 MG/ML IV SOLN
125.0000 mg | Freq: Once | INTRAVENOUS | Status: DC
  Filled 2014-10-18: qty 10

## 2014-10-18 NOTE — Evaluation (Signed)
Physical Therapy Evaluation Patient Details Name: Kevin Bradford MRN: [id number] DOB: [birth year] Today's Date: 10/18/2014   History of Present Illness  69 y.o. male admitted to [facility] on 10/13/14 for left foot wound ulcerceration s/p I & D.  Pt with significant PMHx of DM, CKD, and L foot 5th toe amputation.  Clinical Impression  Pt has been walking to the bathroom without an assistive device.  I educated him that the MD who checked on him today indicated that he needs to be NWB (RN called after PT evaluation and confirmed that MD wants him NWB).  Pt generally refusing NWB status stating that "is impossible" and poor effort at attempts to try NWB on his left foot.  We did donn his post-op shoe that he was given from the wound clinic for additional protection.  Pt will likely be non compliant with NWB status at home despite this therapist's best attempts at educating that the NWB is to help his wound heal and help prevent future surgeries.   PT to follow acutely for deficits listed below.       Follow Up Recommendations Home health PT;Other (comment) (if he is confirmed to be NWB)    Equipment Recommendations  Rolling walker with 5" wheels;Other (comment) (may need WC with elevating leg rest )    Recommendations for Other Services   NA    Precautions / Restrictions Precautions Precautions: Fall Precaution Comments: due to NWB status Restrictions Weight Bearing Restrictions: Yes LLE Weight Bearing: Non weight bearing Other Position/Activity Restrictions: used his post op shoe that he was given by the wound clinic.       Mobility  Bed Mobility Overal bed mobility: Modified Independent             General bed mobility comments: pt using HOB elevated and railing to assist with trunk mobility to EOB.  He was able to mobilize bil feet to EOB.   Transfers Overall transfer level: Needs assistance Equipment used: Rolling walker (2 wheeled);Crutches Transfers: Sit to/from Stand Sit to  Stand: Min guard         General transfer comment: Min guard assist for safety, verbal cues to unweight his left foot during transitions.   Ambulation/Gait Ambulation/Gait assistance: Min guard Ambulation Distance (Feet): 10 Feet Assistive device: Rolling walker (2 wheeled);Crutches Gait Pattern/deviations: Step-to pattern;Antalgic Gait velocity: decreased   General Gait Details: Pt refused NWB status (even though I encouraged this until we could get confirmation from MD as a precaution).  Pt reports, "I can't hop" "that is just not possible" he also did not put effort into trying.  We did confirm that RW was easier and he was able to unweight his left foot more with the RW than with the crutches (he wanted to try crutches).           Balance Overall balance assessment: Needs assistance Sitting-balance support: Feet supported;No upper extremity supported Sitting balance-Leahy Scale: Good     Standing balance support: Bilateral upper extremity supported Standing balance-Leahy Scale: Poor Standing balance comment: Pt unable to even statically stand and maintain NWB left leg.                              Pertinent Vitals/Pain Pain Assessment: 0-10 Pain Score: 1  Pain Location: left foot Pain Descriptors / Indicators: Aching;Burning Pain Intervention(s): Limited activity within patient's tolerance;Monitored during session;Repositioned    Home Living Family/patient expects to be discharged to:: Private residence  Living Arrangements: Spouse/significant other Available Help at Discharge: Family;Available 24 hours/day (24/7- wife could be if needed) Type of Home: House Home Access: Stairs to enter Entrance Stairs-Rails: None Entrance Stairs-Number of Steps: 1 (2 maybe) Home Layout: Two level;1/2 bath on main level;Other (Comment) (doesn't have a bedroom. ) Home Equipment: None      Prior Function Level of Independence: Independent               Hand  Dominance   Dominant Hand: Right    Extremity/Trunk Assessment   Upper Extremity Assessment: Overall WFL for tasks assessed           Lower Extremity Assessment: LLE deficits/detail   LLE Deficits / Details: left leg knee at least 3/5, hip 4/5, ankle NT due to post-op status and wrapping.   Cervical / Trunk Assessment: Normal  Communication   Communication: No difficulties  Cognition Arousal/Alertness: Awake/alert Behavior During Therapy: WFL for tasks assessed/performed Overall Cognitive Status: Within Functional Limits for tasks assessed                               Assessment/Plan    PT Assessment Patient needs continued PT services  PT Diagnosis Difficulty walking;Abnormality of gait;Generalized weakness;Acute pain   PT Problem List Decreased strength;Decreased activity tolerance;Decreased mobility;Decreased balance;Decreased knowledge of use of DME;Decreased knowledge of precautions;Pain  PT Treatment Interventions DME instruction;Gait training;Stair training;Functional mobility training;Therapeutic activities;Therapeutic exercise;Balance training;Neuromuscular re-education;Patient/family education;Modalities   PT Goals (Current goals can be found in the Care Plan section) Acute Rehab PT Goals Patient Stated Goal: to go home  PT Goal Formulation: With patient Time For Goal Achievement: 10/25/14 Potential to Achieve Goals: Good    Frequency Min 3X/week    End of Session Equipment Utilized During Treatment: Other (comment) (used his post op shoe that he was given by the wound clinic)               Time: [id number] PT Time Calculation (min) (ACUTE ONLY): 33 min   Charges:   PT Evaluation $Initial PT Evaluation Tier I: 1 Procedure PT Treatments $Gait Training: 8-22 mins        Ceria Suminski B. Lanie Schelling, PT, DPT [phone]   10/18/2014, 4:12 PM

## 2014-10-18 NOTE — Progress Notes (Signed)
PROGRESS NOTE  Kevin Bradford AQT:[id number] DOB: 1945/07/29 DOA: 10/13/2014 PCP: No primary care provider on file.  Brief narrative 69 year old male with history of uncontrolled type 2 diabetes mellitus with diabetic foot ulcer over the plantar aspect of his left foot presenting with pain and worsening drainage from the ulcer. He was prescribed oral antibiotics as outpatient 2 days back without improvement. He should admitted and placed on empiric IV antibiotics. MRI of the foot without any abscess or osteomyelitis. Orthopedic surgery consulted.   Assessment/Plan: Diabetic foot ulcer -MRI foot negative for abscess or osteomyelitis. Shows soft tissue ulceration with locules of air.  -On empiric vancomycin and Zosyn pending culture data -Appreciate ortho -I&D surgery on 4/1--follow surgical cultures -wound care management post surgery. Needs strict blood glucose control.  -A1c of 11.3. -Increase Levemir 30 units daily at bedtime .  -d/c Amaryl --will need insulin at time of d/c--pt is agreeable -Continue with sliding scale insulin.  -add pre-meal aspart 4 units 3 times a day with meals. Hold a.m. dose of Amaryl. -Appreciate diabetic coordinator evaluation and recommendation. -ESR--119, CRP--8.5  Chronic kidney disease stage III secondary to diabetic nephropathy Renal function currently stable.  Anemia of chronic disease with component of iron deficiency Check stool for occult blood -iron saturation--8% -give IV iron and start po iron  Diabetes Mellitus type 2, uncontrolled -A1c of 11.3. -Increase Levemir 30 units daily at bedtime .  -d/c Amaryl --will need insulin at time of d/c--pt is agreeable -Continue with sliding scale insulin.  -add pre-meal aspart 4 units 3 times a day with meals. Hold a.m. dose of Amaryl. -Appreciate diabetic coordinator evaluation and recommendation. -lipid panel--LDL 91-->lifestyle modification for now  DVT prophylaxis: Subcutaneous  Lovenox  Code Status: Full code Family Communication: wife updated at bedside 4/1 Disposition Plan: home on 10/20/14   Procedures/Studies: Mr Foot Left Wo Contrast  10/14/2014   CLINICAL DATA:  Ulcer along the plantar aspect of the left foot. Worsening infection of the foot.  EXAM: MRI OF THE LEFT FOREFOOT WITHOUT CONTRAST  TECHNIQUE: Multiplanar, multisequence MR imaging was performed. No intravenous contrast was administered.  COMPARISON:  None.  FINDINGS: Soft tissue ulceration along the plantar aspect of the midfoot. There are a few locules of air within the wound.  There is moderate-severe osteoarthritis of the first MTP joint with subchondral reactive marrow changes. There is mild osteoarthritis of the first IP joint. There is a periarticular erosion along the medial aspect of the distal first proximal phalanx as can be seen with crystalline arthropathy such as gout.  There is mild marrow edema in the medial cuneiform and middle cuneiform which is partially visualized and may be reactive secondary to osteoarthritis versus contusion. There is no other marrow signal abnormality. There is no fracture or dislocation.  There is no fluid collection or hematoma. There is fluid in the first intermetatarsal bursa. There is nonspecific soft tissue edema along the dorsal aspect of the midfoot and forefoot which may be reactive versus secondary to cellulitis. There is increased signal within the musculature which is likely neurogenic.  IMPRESSION: 1. Soft tissue ulceration along the plantar aspect of the midfoot with a few locules of air within the wound. 2. No evidence of osteomyelitis of the left forefoot. 3. Moderate -severe osteoarthritis of the first MTP joint. Mild osteoarthritis of the first IP joint. Periarticular erosion along the medial aspect of the distal first proximal phalanx as can be seen with crystalline arthropathy  such as gout. 4. Mild first intermetatarsal bursitis. 5. Mild marrow edema in the  medial cuneiform and middle cuneiform which is partially visualized and may be reactive secondary to osteoarthritis versus contusion.   Electronically Signed   By: Kathreen Devoid   On: 10/14/2014 08:21   Dg Foot Complete Left  10/13/2014   CLINICAL DATA:  Left foot wound with swelling and pain.  EXAM: LEFT FOOT - COMPLETE 3+ VIEW  COMPARISON:  10/03/2014  FINDINGS: Soft tissue swelling is noted.  There is no evidence of acute fracture, subluxation or dislocation.  There is mild cortical irregularity of the lateral first metatarsal head. Osteomyelitis is not excluded.  A calcaneal spur is present.  IMPRESSION: Mild cortical irregularity of the first metatarsal head -osteomyelitis is not excluded. Correlate with wound location.  Soft tissue swelling.   Electronically Signed   By: Margarette Canada M.D.   On: 10/13/2014 18:14   Dg Foot Complete Left  10/03/2014   CLINICAL DATA:  Chronic nonhealing wound at the plantar surface of the foot at the level of the distal third and fourth metatarsals. Diabetic.  EXAM: LEFT FOOT - COMPLETE 3+ VIEW  COMPARISON:  None.  FINDINGS: Lisfranc joint intact. No displaced acute fracture or dislocation. No aggressive osseous lesion. There is soft tissue defect/ ulceration along the plantar surface of the foot at the level of the proximal interphalangeal/MTP joints. No radiopaque foreign body. Posterior and plantar calcaneal enthesophytes. First MTP DJD. Mild atherosclerotic vascular calcifications. Mild calcific densities adjacent to the proximal second and third metatarsal shaft may reflect vascular or other soft tissue calcifications.  IMPRESSION: Plantar wound/ulceration overlying the MTP joints. No acute osseous finding.  Recommend MRI if concern for osteomyelitis persists.   Electronically Signed   By: Carlos Levering M.D.   On: 10/03/2014 20:51         Subjective: Patient denies fevers, chills, headache, chest pain, dyspnea, nausea, vomiting, diarrhea, abdominal pain,  dysuria, hematuria Pain in foot controlled  Objective: Filed Vitals:   10/17/14 0517 10/18/14 0222 10/18/14 0538 10/18/14 1350  BP:  135/71 125/67 120/65  Pulse:  88 87 79  Temp:  99.7 F (37.6 C) 99.2 F (37.3 C) 98.5 F (36.9 C)  TempSrc:  Oral Oral Oral  Resp:  _0 Height:      Weight: 100.2 kg (220 lb 14.4 oz)  101.8 kg (224 lb 6.9 oz)   SpO2:  96% 97% 99%    Intake/Output Summary (Last 24 hours) at 10/18/14 1951 Last data filed at 10/18/14 1808  Gross per 24 hour  Intake    600 ml  Output      0 ml  Net    600 ml   Weight change: 1.6 kg (3 lb 8.4 oz) Exam:   General:  Pt is alert, follows commands appropriately, not in acute distress  HEENT: No icterus, No thrush,Forgan/AT  Cardiovascular: RRR, S1/S2, no rubs, no gallops  Respiratory: CTA bilaterally, no wheezing, no crackles, no rhonchi  Abdomen: Soft/+BS, non tender, non distended, no guarding  Extremities: No edema, No lymphangitis, No petechiae, No rashes, no synovitis;  Left foot wrapped  Data Reviewed: Basic Metabolic Panel:  Recent Labs Lab 10/13/14 1653 10/14/14 0502 10/15/14 0436 10/18/14 0450  NA 127* 131* 135 131*  K 5.2* 4.5 4.5 4.3  CL 95* 103 105 99  CO2 _1 GLUCOSE 336* 235* 216* 294*  BUN 30* _2 CREATININE 1.69* 1.46* 1.36*  1.38*  CALCIUM 9.1 8.5 8.7 8.7   Liver Function Tests:  Recent Labs Lab 10/14/14 0502  AST 27  ALT 26  ALKPHOS 92  BILITOT 0.8  PROT 6.0  ALBUMIN 2.4*   No results for input(s): LIPASE, AMYLASE in the last 168 hours. No results for input(s): AMMONIA in the last 168 hours. CBC:  Recent Labs Lab 10/13/14 1653 10/14/14 0502 10/15/14 0436 10/18/14 0450  WBC 12.6* 9.3 7.7 7.0  NEUTROABS 10.4*  --   --   --   HGB 12.4* 10.9* 10.4* 11.0*  HCT 36.3* 32.6* 32.2* 33.8*  MCV 86.6 87.2 88.5 88.5  PLT 329 276 287 330   Cardiac Enzymes: No results for input(s): CKTOTAL, CKMB, CKMBINDEX, TROPONINI in the last 168 hours. BNP: Invalid  input(s): POCBNP CBG:  Recent Labs Lab 10/17/14 1856 10/17/14 2130 10/18/14 0628 10/18/14 1209 10/18/14 1655  GLUCAP 166* 182* 255* 198* 228*    Recent Results (from the past 240 hour(s))  Culture, blood (routine x 2)     Status: None (Preliminary result)   Collection Time: 10/13/14  4:53 PM  Result Value Ref Range Status   Specimen Description BLOOD LEFT ARM  Final   Special Requests BOTTLES DRAWN AEROBIC AND ANAEROBIC 10CCS  Final   Culture   Final           BLOOD CULTURE RECEIVED NO GROWTH TO DATE CULTURE WILL BE HELD FOR 5 DAYS BEFORE ISSUING A FINAL NEGATIVE REPORT Note: Culture results may be compromised due to an excessive volume of blood received in culture bottles. Performed at [facility]    Report Status PENDING  Incomplete  Culture, blood (routine x 2)     Status: None (Preliminary result)   Collection Time: 10/13/14  8:52 PM  Result Value Ref Range Status   Specimen Description BLOOD RIGHT ARM  Final   Special Requests BOTTLES DRAWN AEROBIC AND ANAEROBIC 10CC EA  Final   Culture   Final           BLOOD CULTURE RECEIVED NO GROWTH TO DATE CULTURE WILL BE HELD FOR 5 DAYS BEFORE ISSUING A FINAL NEGATIVE REPORT Note: Culture results may be compromised due to an excessive volume of blood received in culture bottles. Performed at [facility]    Report Status PENDING  Incomplete  Wound culture     Status: None   Collection Time: 10/14/14  6:13 AM  Result Value Ref Range Status   Specimen Description WOUND LEFT FOOT  Final   Special Requests Normal  Final   Gram Stain   Final    RARE WBC PRESENT, PREDOMINANTLY PMN NO SQUAMOUS EPITHELIAL CELLS SEEN NO ORGANISMS SEEN Performed at [facility]    Culture   Final    NO GROWTH 2 DAYS Performed at [facility]    Report Status 10/16/2014 FINAL  Final  Surgical pcr screen     Status: None   Collection Time: 10/16/14  9:54 AM  Result Value Ref Range Status   MRSA, PCR NEGATIVE  NEGATIVE Final   Staphylococcus aureus NEGATIVE NEGATIVE Final    Comment:        The Xpert SA Assay (FDA approved for NASAL specimens in patients over 73 years of age), is one component of a comprehensive surveillance program.  Test performance has been validated by [facility] for patients greater than or equal to 62 year old. It is not intended to diagnose infection nor to guide or monitor treatment.  Scheduled Meds: . feeding supplement (GLUCERNA SHAKE)  237 mL Oral BID BM  . insulin aspart  0-15 Units Subcutaneous TID WC  . insulin aspart  0-5 Units Subcutaneous QHS  . insulin aspart  4 Units Subcutaneous TID WC  . insulin detemir  20 Units Subcutaneous QHS  . piperacillin-tazobactam (ZOSYN)  IV  3.375 g Intravenous Q8H  . vancomycin  750 mg Intravenous Q12H   Continuous Infusions: . sodium chloride 10 mL/hr at 10/17/14 1756  . sodium chloride       Keiosha Cancro, DO  [facility] Pager [phone]  If 7PM-7AM, please contact night-coverage [email or web address] Password [email or web address] 10/18/2014, 7:51 PM   LOS: 5 days

## 2014-10-18 NOTE — Progress Notes (Signed)
INITIAL NUTRITION ASSESSMENT  DOCUMENTATION CODES Per approved criteria  -Not Applicable   INTERVENTION: Provide Glucerna Shake po BID, each supplement provides 220 kcal and 10 grams of protein.  NUTRITION DIAGNOSIS: Increased nutrient needs related to wound healing as evidenced by estimated nutrition needs.   Goal:  Pt to meet >/= 90% of their estimated nutrition needs   Monitor:  PO intake, weight trends, labs, I/O's  Reason for Assessment: MD consult  69 y.o. male  Admitting Dx: <principal problem not specified>  ASSESSMENT: Pt with dm2, c/o diabetic foot ulcer started in 06/2014 and was getting better and then got worse over the past 10 days, Slight yellow drainage.  PROCEDURE: Excisional DEBRIDEMENT EXTREMITY LEFT FOOT  Pt reports having a good appetite currently and PTA at home eating 3 meals a day. Meal completion has been 100%. Weight has been stable. Noted pt with a pressure ulcer. Pt is agreeable to Glucerna Shake. RD to order.   Pt with no observed significant fat or muscle mass loss.  RD was additionally consulted for diet education. Education given to pt and wife.   Labs: Low sodium and GFR. High creatinine.  Height: Ht Readings from Last 1 Encounters:  10/13/14 6\' 2"  (1.88 m)    Weight: Wt Readings from Last 1 Encounters:  10/18/14 224 lb 6.9 oz (101.8 kg)    Ideal Body Weight: 190 lbs  % Ideal Body Weight: 118%  Wt Readings from Last 10 Encounters:  10/18/14 224 lb 6.9 oz (101.8 kg)    Usual Body Weight: 224 lbs  % Usual Body Weight: 100%  BMI:  Body mass index is 28.8 kg/(m^2).  Estimated Nutritional Needs: Kcal: 2100-2400 Protein: 110-130 grams Fluid: 2.1 - 2.4 L/day  Skin: Stage III pressure ulcer on ball of L foot, incision on L foot, non-pitting LE edema  Diet Order: Diet Carb Modified Fluid consistency:: Thin; Room service appropriate?: Yes  EDUCATION NEEDS: -Education needs addressed   Intake/Output Summary (Last 24  hours) at 10/18/14 0819 Last data filed at 10/18/14 0810  Gross per 24 hour  Intake    630 ml  Output     10 ml  Net    620 ml    Last BM: 3/27  Labs:   Recent Labs Lab 10/14/14 0502 10/15/14 0436 10/18/14 0450  NA 131* 135 131*  K 4.5 4.5 4.3  CL 103 105 99  CO2 20 22 30   BUN 21 16 13   CREATININE 1.46* 1.36* 1.38*  CALCIUM 8.5 8.7 8.7  GLUCOSE 235* 216* 294*    CBG (last 3)   Recent Labs  10/17/14 1856 10/17/14 2130 10/18/14 0628  GLUCAP 166* 182* 255*    Scheduled Meds: . insulin aspart  0-15 Units Subcutaneous TID WC  . insulin aspart  0-5 Units Subcutaneous QHS  . insulin aspart  4 Units Subcutaneous TID WC  . insulin detemir  20 Units Subcutaneous QHS  . piperacillin-tazobactam (ZOSYN)  IV  3.375 g Intravenous Q8H  . vancomycin  750 mg Intravenous Q12H    Continuous Infusions: . sodium chloride 10 mL/hr at 10/17/14 1756  . sodium chloride      Past Medical History  Diagnosis Date  . Diabetes mellitus without complication   . Diabetic foot ulcer     left  . CKD (chronic kidney disease) stage 3, GFR 30-59 ml/min     Past Surgical History  Procedure Laterality Date  . Toe amputation      Marijean NiemannStephanie La, MS, RD, LDN  Pager # [phone] After hours/ weekend pager # [phone]

## 2014-10-18 NOTE — Care Management Note (Addendum)
CARE MANAGEMENT NOTE 10/18/2014  Patient:  Kevin Bradford, Kevin Bradford   Account Number:  [id number]  Date Initiated:  10/18/2014  Documentation initiated by:  Oliveras-Aizpurua,Ednah Hammock  Subjective/Objective Assessment:   69 yo M with L wound ulceration, s/p I & D     Action/Plan:   PT is recommending HHPT if confirmed to be NWB   Anticipated DC Date:  10/20/2014   Anticipated DC Plan:        [place]  CM consult      Choice offered to / List presented to:             Status of service:  In process, will continue to follow Medicare Important Message given?   (If response is "NO", the following Medicare IM given date fields will be blank) Date Medicare IM given:   Medicare IM given by:   Date Additional Medicare IM given:  10/18/2014 Additional Medicare IM given by:  Norina Buzzard  Discharge Disposition:    Per UR Regulation:    If discussed at Long Length of Stay Meetings, dates discussed:    Comments:  10/18/14 1630 - Frann Rider, RN, BSN  Met with pt. D/C plan is to return home with the support of his wife. PT is recommending HHPT if confirmed to be NWB, a RW and he may need a W/C. Will f/u with PT recommendations once NWB status has been confirmed.

## 2014-10-18 NOTE — Progress Notes (Signed)
Dressings c/d/i.  Patient is stable from ortho standpoint.  Up with PT today. NWB. Plan to dc home Monday per Dr. Lajoyce Cornersuda on po abx.    Mayra ReelN. Michael Xu, MD [facility] [phone] 7:57 AM

## 2014-10-18 NOTE — Progress Notes (Signed)
ANTIBIOTIC CONSULT NOTE - FOLLOW UP  Pharmacy Consult for Vanco/Zosyn Indication: wound infection  No Known Allergies  Patient Measurements: Height: 6\' 2"  (188 cm) Weight: 224 lb 6.9 oz (101.8 kg) IBW/kg (Calculated) : 82.2 Adjusted Body Weight:    Vital Signs: Temp: 98.5 F (36.9 C) (04/02 1350) Temp Source: Oral (04/02 1350) BP: 120/65 mmHg (04/02 1350) Pulse Rate: 79 (04/02 1350) Intake/Output from previous day: 04/01 0701 - 04/02 0700 In: 390 [P.O.:340; I.V.:50] Out: 10 [Blood:10] Intake/Output from this shift: Total I/O In: 240 [P.O.:240] Out: -   Labs:  Recent Labs  10/18/14 0450  WBC 7.0  HGB 11.0*  PLT 330  CREATININE 1.38*   Estimated Creatinine Clearance: 64.3 mL/min (by C-G formula based on Cr of 1.38).  Recent Labs  10/16/14 1250 10/18/14 1354  VANCOTROUGH 14.4 14.2     Microbiology: Recent Results (from the past 720 hour(s))  Culture, blood (routine x 2)     Status: None (Preliminary result)   Collection Time: 10/13/14  4:53 PM  Result Value Ref Range Status   Specimen Description BLOOD LEFT ARM  Final   Special Requests BOTTLES DRAWN AEROBIC AND ANAEROBIC 10CCS  Final   Culture   Final           BLOOD CULTURE RECEIVED NO GROWTH TO DATE CULTURE WILL BE HELD FOR 5 DAYS BEFORE ISSUING A FINAL NEGATIVE REPORT Note: Culture results may be compromised due to an excessive volume of blood received in culture bottles. Performed at [facility]    Report Status PENDING  Incomplete  Culture, blood (routine x 2)     Status: None (Preliminary result)   Collection Time: 10/13/14  8:52 PM  Result Value Ref Range Status   Specimen Description BLOOD RIGHT ARM  Final   Special Requests BOTTLES DRAWN AEROBIC AND ANAEROBIC 10CC EA  Final   Culture   Final           BLOOD CULTURE RECEIVED NO GROWTH TO DATE CULTURE WILL BE HELD FOR 5 DAYS BEFORE ISSUING A FINAL NEGATIVE REPORT Note: Culture results may be compromised due to an excessive volume of  blood received in culture bottles. Performed at [facility]    Report Status PENDING  Incomplete  Wound culture     Status: None   Collection Time: 10/14/14  6:13 AM  Result Value Ref Range Status   Specimen Description WOUND LEFT FOOT  Final   Special Requests Normal  Final   Gram Stain   Final    RARE WBC PRESENT, PREDOMINANTLY PMN NO SQUAMOUS EPITHELIAL CELLS SEEN NO ORGANISMS SEEN Performed at [facility]    Culture   Final    NO GROWTH 2 DAYS Performed at [facility]    Report Status 10/16/2014 FINAL  Final  Surgical pcr screen     Status: None   Collection Time: 10/16/14  9:54 AM  Result Value Ref Range Status   MRSA, PCR NEGATIVE NEGATIVE Final   Staphylococcus aureus NEGATIVE NEGATIVE Final    Comment:        The Xpert SA Assay (FDA approved for NASAL specimens in patients over 69 years of age), is one component of a comprehensive surveillance program.  Test performance has been validated by [facility] for patients greater than or equal to 69 year old. It is not intended to diagnose infection nor to guide or monitor treatment.     Anti-infectives    Start     Dose/Rate  Route Frequency Ordered Stop   10/14/14 1400  vancomycin (VANCOCIN) IVPB 750 mg/150 ml premix     750 mg 150 mL/hr over 60 Minutes Intravenous Every 12 hours 10/14/14 1328     10/14/14 0600  piperacillin-tazobactam (ZOSYN) IVPB 3.375 g     3.375 g 12.5 mL/hr over 240 Minutes Intravenous Every 8 hours 10/13/14 2029     10/13/14 2100  piperacillin-tazobactam (ZOSYN) IVPB 3.375 g  Status:  Discontinued     3.375 g 12.5 mL/hr over 240 Minutes Intravenous Every 8 hours 10/13/14 2028 10/13/14 2029   10/13/14 2045  piperacillin-tazobactam (ZOSYN) IVPB 3.375 g     3.375 g 100 mL/hr over 30 Minutes Intravenous  Once 10/13/14 2030 10/14/14 0107   10/13/14 1900  vancomycin (VANCOCIN) 2,500 mg in sodium chloride 0.9 % 500 mL IVPB     2,500 mg 250 mL/hr over 120 Minutes  Intravenous  Once 10/13/14 1855 10/13/14 2247   10/13/14 1900  ciprofloxacin (CIPRO) IVPB 400 mg  Status:  Discontinued     400 mg 200 mL/hr over 60 Minutes Intravenous Every 12 hours 10/13/14 1858 10/13/14 2019      [name]. He was recently seen in the ED but declined admission and was sent home on clindamycin and Bactrim, but the wound has worsened.   ID: Vancomycin + Zosyn for diabetic foot ulcer, MRI neg for osteo. Planning I&D on 4/1, may place wound vac. Afebrile, WBC wnl.  Renal function stable. I&D 4/1  Vanc 3/28>> - 3/31 VT 14.4. - 4/2 VT 14.2. Zosyn 3/29>>  3/29 L foot - negative 3/28 BCx2 - NGTD  CV: No hx. VSS  Endo: DM (A1c 11.3), CBGs 166-255. On levemir 20 units daily, moderate SSI ac/hs + meal coverage.   GI/Nutrition: LFTs WNL.   Renal: CKD stage III, SCr 1.38, CrCl~64  Heme/Onc: Hb 11  PTA Med Issues: Amaryl, Lovaza  Best Practices: SCDs only  Goal of Therapy:  Vancomycin trough level 10-15 mcg/ml  Plan:  Plan d/c home Monday Continue Zosyn 3.375g Iv q8hr. Continue Vancomycin  IV q12h  Kevin Bradford S. Merilynn Finland, PharmD, BCPS Clinical Staff Pharmacist Pager [phone]  Kevin Bradford 10/18/2014,3:03 PM

## 2014-10-18 NOTE — Plan of Care (Signed)
Problem: Food- and Nutrition-Related Knowledge Deficit (NB-1.1) Goal: Nutrition education Formal process to instruct or train a patient/client in a skill or to impart knowledge to help patients/clients voluntarily manage or modify food choices and eating behavior to maintain or improve health. Outcome: Completed/Met Date Met:  10/18/14  RD consulted for nutrition education regarding diabetes.     Lab Results  Component Value Date    HGBA1C 11.3* 10/14/2014    RD provided "Carbohydrate Counting for People with Diabetes" handout from the Academy of Nutrition [employer] Dietetics. Wife at pt beside. Discussed different food groups and their effects on blood sugar, emphasizing carbohydrate-containing foods. Provided list of carbohydrates and recommended serving sizes of common foods.  Discussed importance of controlled and consistent carbohydrate intake throughout the day. Recommended 4-5 servings of carbohydrates at meals and 1-2 serving of carbohydrates at snack. Recommended adequate protein intake. Provided examples of ways to balance meals/snacks and encouraged intake of high-fiber, whole grain complex carbohydrates. Discussed diabetic friendly drink options. Teach back method used.  Expect good compliance.  Kallie Locks, MS, RD, LDN Pager # [phone] After hours/ weekend pager # [phone]

## 2014-10-19 LAB — BASIC METABOLIC PANEL
Anion gap: 8 (ref 5–15)
BUN: 14 mg/dL (ref 6–23)
CALCIUM: 8.5 mg/dL (ref 8.4–10.5)
CHLORIDE: 99 mmol/L (ref 96–112)
CO2: 26 mmol/L (ref 19–32)
CREATININE: 1.58 mg/dL — AB (ref 0.50–1.35)
GFR calc Af Amer: 50 mL/min — ABNORMAL LOW (ref >90)
GFR, EST NON AFRICAN AMERICAN: 43 mL/min — AB (ref >90)
Glucose, Bld: 261 mg/dL — ABNORMAL HIGH (ref 70–99)
Potassium: 4.2 mmol/L (ref 3.5–5.1)
SODIUM: 133 mmol/L — AB (ref 135–145)

## 2014-10-19 LAB — GLUCOSE, CAPILLARY
GLUCOSE-CAPILLARY: 236 mg/dL — AB (ref 70–99)
GLUCOSE-CAPILLARY: 147 mg/dL — AB (ref 70–99)
Glucose-Capillary: 242 mg/dL — ABNORMAL HIGH (ref 70–99)
Glucose-Capillary: 324 mg/dL — ABNORMAL HIGH (ref 70–99)

## 2014-10-19 MED ORDER — INSULIN DETEMIR 100 UNIT/ML ~~LOC~~ SOLN
40.0000 [IU] | Freq: Every day | SUBCUTANEOUS | Status: DC
  Administered 2014-10-19: 40 [IU] via SUBCUTANEOUS
  Filled 2014-10-19 (×2): qty 0.4

## 2014-10-19 MED ORDER — NA FERRIC GLUC CPLX IN SUCROSE 12.5 MG/ML IV SOLN
125.0000 mg | Freq: Once | INTRAVENOUS | Status: AC
  Administered 2014-10-19: 125 mg via INTRAVENOUS
  Filled 2014-10-19: qty 10

## 2014-10-19 MED ORDER — SODIUM CHLORIDE 0.9 % IV SOLN: INTRAVENOUS | Status: DC

## 2014-10-19 MED ORDER — FERROUS SULFATE 325 (65 FE) MG PO TABS: 325.0000 mg | ORAL_TABLET | Freq: Two times a day (BID) | ORAL | Status: DC

## 2014-10-19 MED ORDER — INSULIN ASPART 100 UNIT/ML ~~LOC~~ SOLN
6.0000 [IU] | Freq: Three times a day (TID) | SUBCUTANEOUS | Status: DC
  Administered 2014-10-20 (×2): 6 [IU] via SUBCUTANEOUS

## 2014-10-19 NOTE — Progress Notes (Signed)
Physical Therapy Treatment Patient Details Name: Kevin Bradford MRN: [id number] DOB: [birth year] Today's Date: 10/19/2014    History of Present Illness 69 y.o. male admitted to [facility] on 10/13/14 for left foot wound ulcerceration s/p I & D.  Pt with significant PMHx of DM, CKD, and L foot 5th toe amputation.    PT Comments    Improving functional mobility; took extra time to discuss what "letter of the law" NWB is and looks like, and the rationale for a wheelchair or rolling knee walker to keep NWB while pt is moving around; Better able to keep NWB today than last session;  Pt is very interested in using a Darco shoe so that he can still step on L foot, just on the heel; I informed him that Dr. Lajoyce Corners would have to deem that OK and update WBstatus and order the Darco shoe  Follow Up Recommendations  Home health PT;Supervision - Intermittent     Equipment Recommendations  Rolling walker with 5" wheels;Other (comment) (consider knee walker)    Recommendations for Other Services       Precautions / Restrictions Precautions Precautions: Fall Precaution Comments: due to NWB status Restrictions LLE Weight Bearing: Non weight bearing Other Position/Activity Restrictions: used his post op shoe that he was given by the wound clinic.     Mobility  Bed Mobility Overal bed mobility: Modified Independent                Transfers Overall transfer level: Needs assistance Equipment used: Rolling walker (2 wheeled) Transfers: Sit to/from Stand Sit to Stand: Min guard         General transfer comment: Min guard assist for safety, verbal cues to unweight his left foot during transitions. Noted decr control of descent with stand to sit, which he was able to correct when this was pointed out  Ambulation/Gait Ambulation/Gait assistance: Min guard Ambulation Distance (Feet): 6 Feet (in addition to pivot steps to the chair) Assistive device: Rolling walker (2 wheeled) Gait  Pattern/deviations: Step-to pattern Gait velocity: decreased   General Gait Details: continues to have difficulty with keeping NWB LLE, but more willing to give it a try this session; able to take 5-6 "hop" steps and keep the weight off of LLE correctly; fatigued after steps   Administrator mobility: Yes Wheelchair Assistance Details (indicate cue type and reason): Much discussion re: rationale for recommendation of wheelchair, especially if he cannot keep NWB; he and wife believe a wheelchair will not fit in the first floor of their home; We then discussed the possibiliyt of using a knee walker, and practiced getting on/off and wheeled minimally in room  Modified Rankin (Stroke Patients Only)       Balance     Sitting balance-Leahy Scale: Poor       Standing balance-Leahy Scale: Poor Standing balance comment: improved from yesterday, but still benefits from UE support                    Cognition Arousal/Alertness: Awake/alert Behavior During Therapy: WFL for tasks assessed/performed Overall Cognitive Status: Within Functional Limits for tasks assessed                      Exercises      General Comments        Pertinent Vitals/Pain Pain Assessment: Faces Faces Pain Scale: Hurts little more Pain Location: L foot  Pain Descriptors / Indicators: Aching Pain Intervention(s): Monitored during session;Repositioned    Home Living                      Prior Function            PT Goals (current goals can now be found in the care plan section) Acute Rehab PT Goals Patient Stated Goal: to go home  PT Goal Formulation: With patient Time For Goal Achievement: 10/25/14 Potential to Achieve Goals: Good Progress towards PT goals: Progressing toward goals    Frequency  Min 3X/week    PT Plan Current plan remains appropriate    Co-evaluation             End of Session  Equipment Utilized During Treatment: Other (comment) (used his post op shoe that he was given by the wound clinic) Activity Tolerance: Patient tolerated treatment well Patient left: in chair;with call bell/phone within reach;with family/visitor present     Time: 1629-1700 PT Time Calculation (min) (ACUTE ONLY): 31 min  Charges:  $Gait Training: 8-22 mins $Therapeutic Activity: 8-22 mins                    G Codes:      Olen PelGarrigan, Marinus Eicher Hamff 10/19/2014, 5:19 PM  Van ClinesHolly Ersilia Brawley, [place]PT  Acute Rehabilitation Services Pager [phone] Office [phone]

## 2014-10-19 NOTE — Progress Notes (Addendum)
PROGRESS NOTE  Kevin Bradford BMW:[id number] DOB: Oct 27, 1945 DOA: 10/13/2014 PCP: No primary care provider on file.  Brief narrative 69 year old male with history of uncontrolled type 2 diabetes mellitus with diabetic foot ulcer over the plantar aspect of his left foot presenting with pain and worsening drainage from the ulcer. He was prescribed oral antibiotics as outpatient 2 days back without improvement. He should admitted and placed on empiric IV antibiotics. MRI of the foot without any abscess or osteomyelitis. Orthopedic surgery consulted. The patient was taken to surgery on 10/17/2014. Irrigation and debridement was performed and surgical cultures were obtained.   Assessment/Plan: Diabetic foot ulcer -MRI foot negative for abscess or osteomyelitis. Shows soft tissue ulceration with locules of air.  -On empiric vancomycin and Zosyn pending culture data -Appreciate ortho -I&D surgery on 4/1--follow surgical cultures--neg to date -wound care management post surgery. Needs strict blood glucose control.  -A1c of 11.3.  -d/c Amaryl --will need insulin at time of d/c--pt is agreeable -Continue with sliding scale insulin.  -Appreciate diabetic coordinator evaluation and recommendation. -ESR--119, CRP--8.5  Chronic kidney disease stage III secondary to diabetic nephropathy Renal function currently stable. -10/19/14--since creatinine increased to 1.5, give 1L fluid today  Anemia of chronic disease with component of iron deficiency Check stool for occult blood -iron saturation--8% -give IV iron and start po iron--pt agrees to take it today -pt never has had screening colonoscopy--will need in outpt setting  Diabetes Mellitus type 2, uncontrolled -A1c of 11.3. -Increase Levemir 40 units daily at bedtime .  -d/c Amaryl --will need insulin at time of d/c--pt is agreeable -Continue with sliding scale insulin.  -add pre-meal aspart 6 units 3 times a day with meals. Hold  a.m. dose of Amaryl. -Appreciate diabetic coordinator evaluation and recommendation. -lipid panel--LDL 91-->lifestyle modification for now -appreciate nutrition  DVT prophylaxis: Subcutaneous Lovenox  Code Status: Full code Family Communication: wife updated at bedside 4/3 Disposition Plan: home on 10/20/14 if stable      Procedures/Studies: Mr Foot Left Wo Contrast  10/14/2014   CLINICAL DATA:  Ulcer along the plantar aspect of the left foot. Worsening infection of the foot.  EXAM: MRI OF THE LEFT FOREFOOT WITHOUT CONTRAST  TECHNIQUE: Multiplanar, multisequence MR imaging was performed. No intravenous contrast was administered.  COMPARISON:  None.  FINDINGS: Soft tissue ulceration along the plantar aspect of the midfoot. There are a few locules of air within the wound.  There is moderate-severe osteoarthritis of the first MTP joint with subchondral reactive marrow changes. There is mild osteoarthritis of the first IP joint. There is a periarticular erosion along the medial aspect of the distal first proximal phalanx as can be seen with crystalline arthropathy such as gout.  There is mild marrow edema in the medial cuneiform and middle cuneiform which is partially visualized and may be reactive secondary to osteoarthritis versus contusion. There is no other marrow signal abnormality. There is no fracture or dislocation.  There is no fluid collection or hematoma. There is fluid in the first intermetatarsal bursa. There is nonspecific soft tissue edema along the dorsal aspect of the midfoot and forefoot which may be reactive versus secondary to cellulitis. There is increased signal within the musculature which is likely neurogenic.  IMPRESSION: 1. Soft tissue ulceration along the plantar aspect of the midfoot with a few locules of air within the wound. 2. No evidence of osteomyelitis of the left forefoot. 3. Moderate -severe osteoarthritis of the first MTP  joint. Mild osteoarthritis of the first IP  joint. Periarticular erosion along the medial aspect of the distal first proximal phalanx as can be seen with crystalline arthropathy such as gout. 4. Mild first intermetatarsal bursitis. 5. Mild marrow edema in the medial cuneiform and middle cuneiform which is partially visualized and may be reactive secondary to osteoarthritis versus contusion.   Electronically Signed   By: Kathreen Devoid   On: 10/14/2014 08:21   Dg Foot Complete Left  10/13/2014   CLINICAL DATA:  Left foot wound with swelling and pain.  EXAM: LEFT FOOT - COMPLETE 3+ VIEW  COMPARISON:  10/03/2014  FINDINGS: Soft tissue swelling is noted.  There is no evidence of acute fracture, subluxation or dislocation.  There is mild cortical irregularity of the lateral first metatarsal head. Osteomyelitis is not excluded.  A calcaneal spur is present.  IMPRESSION: Mild cortical irregularity of the first metatarsal head -osteomyelitis is not excluded. Correlate with wound location.  Soft tissue swelling.   Electronically Signed   By: Margarette Canada M.D.   On: 10/13/2014 18:14   Dg Foot Complete Left  10/03/2014   CLINICAL DATA:  Chronic nonhealing wound at the plantar surface of the foot at the level of the distal third and fourth metatarsals. Diabetic.  EXAM: LEFT FOOT - COMPLETE 3+ VIEW  COMPARISON:  None.  FINDINGS: Lisfranc joint intact. No displaced acute fracture or dislocation. No aggressive osseous lesion. There is soft tissue defect/ ulceration along the plantar surface of the foot at the level of the proximal interphalangeal/MTP joints. No radiopaque foreign body. Posterior and plantar calcaneal enthesophytes. First MTP DJD. Mild atherosclerotic vascular calcifications. Mild calcific densities adjacent to the proximal second and third metatarsal shaft may reflect vascular or other soft tissue calcifications.  IMPRESSION: Plantar wound/ulceration overlying the MTP joints. No acute osseous finding.  Recommend MRI if concern for osteomyelitis persists.    Electronically Signed   By: Carlos Levering M.D.   On: 10/03/2014 20:51         Subjective: Patient denies fevers, chills, headache, chest pain, dyspnea, nausea, vomiting, diarrhea, abdominal pain, dysuria, hematuria   Objective: Filed Vitals:   10/18/14 1350 10/18/14 2023 10/19/14 0426 10/19/14 1300  BP: 120/65 115/79 111/74 113/72  Pulse: 79 81 81 78  Temp: 98.5 F (36.9 C) 98.2 F (36.8 C) 97.6 F (36.4 C) 97.3 F (36.3 C)  TempSrc: Oral Oral Oral Oral  Resp: 16 16 18 18   Height:      Weight:   99.6 kg (219 lb 9.3 oz)   SpO2: 99% 99% 100% 99%    Intake/Output Summary (Last 24 hours) at 10/19/14 1711 Last data filed at 10/19/14 1300  Gross per 24 hour  Intake   1820 ml  Output      0 ml  Net   1820 ml   Weight change: -2.2 kg (-4 lb 13.6 oz) Exam:   General:  Pt is alert, follows commands appropriately, not in acute distress  HEENT: No icterus, No thrush, No neck mass, [place]/AT  Cardiovascular: RRR, S1/S2, no rubs, no gallops  Respiratory: CTA bilaterally, no wheezing, no crackles, no rhonchi  Abdomen: Soft/+BS, non tender, non distended, no guarding  Extremities: No edema, No lymphangitis, No petechiae, No rashes, no synovitis; L-foot wrapped  Data Reviewed: Basic Metabolic Panel:  Recent Labs Lab 10/13/14 1653 10/14/14 0502 10/15/14 0436 10/18/14 0450 10/19/14 0653  NA 127* 131* 135 131* 133*  K 5.2* 4.5 4.5 4.3 4.2  CL 95*  103 105 99 99  CO2 20 20 22 30 26   GLUCOSE 336* 235* 216* 294* 261*  BUN 30* 21 16 13 14   CREATININE 1.69* 1.46* 1.36* 1.38* 1.58*  CALCIUM 9.1 8.5 8.7 8.7 8.5   Liver Function Tests:  Recent Labs Lab 10/14/14 0502  AST 27  ALT 26  ALKPHOS 92  BILITOT 0.8  PROT 6.0  ALBUMIN 2.4*   No results for input(s): LIPASE, AMYLASE in the last 168 hours. No results for input(s): AMMONIA in the last 168 hours. CBC:  Recent Labs Lab 10/13/14 1653 10/14/14 0502 10/15/14 0436 10/18/14 0450  WBC 12.6* 9.3 7.7 7.0    NEUTROABS 10.4*  --   --   --   HGB 12.4* 10.9* 10.4* 11.0*  HCT 36.3* 32.6* 32.2* 33.8*  MCV 86.6 87.2 88.5 88.5  PLT 329 276 287 330   Cardiac Enzymes: No results for input(s): CKTOTAL, CKMB, CKMBINDEX, TROPONINI in the last 168 hours. BNP: Invalid input(s): POCBNP CBG:  Recent Labs Lab 10/18/14 1209 10/18/14 1655 10/18/14 2118 10/19/14 0627 10/19/14 1611  GLUCAP 198* 228* 241* 242* 324*    Recent Results (from the past 240 hour(s))  Culture, blood (routine x 2)     Status: None (Preliminary result)   Collection Time: 10/13/14  4:53 PM  Result Value Ref Range Status   Specimen Description BLOOD LEFT ARM  Final   Special Requests BOTTLES DRAWN AEROBIC AND ANAEROBIC 10CCS  Final   Culture   Final           BLOOD CULTURE RECEIVED NO GROWTH TO DATE CULTURE WILL BE HELD FOR 5 DAYS BEFORE ISSUING A FINAL NEGATIVE REPORT Note: Culture results may be compromised due to an excessive volume of blood received in culture bottles. Performed at [facility]    Report Status PENDING  Incomplete  Culture, blood (routine x 2)     Status: None (Preliminary result)   Collection Time: 10/13/14  8:52 PM  Result Value Ref Range Status   Specimen Description BLOOD RIGHT ARM  Final   Special Requests BOTTLES DRAWN AEROBIC AND ANAEROBIC 10CC EA  Final   Culture   Final           BLOOD CULTURE RECEIVED NO GROWTH TO DATE CULTURE WILL BE HELD FOR 5 DAYS BEFORE ISSUING A FINAL NEGATIVE REPORT Note: Culture results may be compromised due to an excessive volume of blood received in culture bottles. Performed at [facility]    Report Status PENDING  Incomplete  Wound culture     Status: None   Collection Time: 10/14/14  6:13 AM  Result Value Ref Range Status   Specimen Description WOUND LEFT FOOT  Final   Special Requests Normal  Final   Gram Stain   Final    RARE WBC PRESENT, PREDOMINANTLY PMN NO SQUAMOUS EPITHELIAL CELLS SEEN NO ORGANISMS SEEN Performed at [facility]    Culture   Final    NO GROWTH 2 DAYS Performed at [facility]    Report Status 10/16/2014 FINAL  Final  Surgical pcr screen     Status: None   Collection Time: 10/16/14  9:54 AM  Result Value Ref Range Status   MRSA, PCR NEGATIVE NEGATIVE Final   Staphylococcus aureus NEGATIVE NEGATIVE Final    Comment:        The Xpert SA Assay (FDA approved for NASAL specimens in patients over 66 years of age), is one component of a comprehensive surveillance  program.  Test performance has been validated by [facility] for patients greater than or equal to 51 year old. It is not intended to diagnose infection nor to guide or monitor treatment.   Tissue culture     Status: None (Preliminary result)   Collection Time: 10/17/14  6:30 PM  Result Value Ref Range Status   Specimen Description TISSUE LEFT  FOOT  Final   Special Requests NONE  Final   Gram Stain PENDING  Incomplete   Culture   Final    NO GROWTH 1 DAY Performed at [facility]    Report Status PENDING  Incomplete     Scheduled Meds: . feeding supplement (GLUCERNA SHAKE)  237 mL Oral BID BM  . ferric gluconate (FERRLECIT/NULECIT) IV  125 mg Intravenous Once  . ferrous sulfate  325 mg Oral BID WC  . insulin aspart  0-15 Units Subcutaneous TID WC  . insulin aspart  0-5 Units Subcutaneous QHS  . insulin aspart  4 Units Subcutaneous TID WC  . insulin detemir  30 Units Subcutaneous QHS  . piperacillin-tazobactam (ZOSYN)  IV  3.375 g Intravenous Q8H  . vancomycin  750 mg Intravenous Q12H   Continuous Infusions: . sodium chloride 10 mL/hr at 10/17/14 1756  . sodium chloride       Lakeisha Waldrop, DO  [facility] Pager [phone]  If 7PM-7AM, please contact night-coverage [email or web address] Password [email or web address] 10/19/2014, 5:11 PM   LOS: 6 days

## 2014-10-19 NOTE — Discharge Summary (Signed)
Physician Discharge Summary  Kevin Bradford GGY:[id number] DOB: 1946/05/08 DOA: 10/13/2014  PCP: No primary care provider on file.  Admit date: 10/13/2014 Discharge date: 10/20/2014 Recommendations for Outpatient Follow-up:  1. Pt will need to follow up with PCP in 2 weeks post discharge 2. Please obtain BMP and CBC in 1-2 weeks  Discharge Diagnoses:  Diabetic foot infection/Foot abscess -MRI foot negative for abscess or osteomyelitis. Shows soft tissue ulceration with locules of air.  -On empiric vancomycin and Zosyn pending culture data -Appreciate ortho -I&D surgery on 4/1--follow surgical cultures--neg to date -wound care management post surgery. Needs strict blood glucose control.  -A1c of 11.3.  -d/c Amaryl --will need insulin at time of d/c--pt is agreeable -Continue with sliding scale insulin.  -Appreciate diabetic coordinator evaluation and recommendation. -ESR--119, CRP--8.5 -home with doxycycline 159m bid x 11 days and levofloxacin 5070mdaily x 11 days which will complete 14 days of therapy from date of surgery  Chronic kidney disease stage III secondary to diabetic nephropathy Renal function currently stable. -baseline appears to be 1.3-1.5  Anemia of chronic disease with component of iron deficiency Check stool for occult blood -iron saturation--8% -give IV iron and start po iron--pt agrees to take it after discussing risks, benefits, and alternatives -pt never has had screening colonoscopy--will need in outpt setting -home with ferrous sulfate 32517mid  Diabetes Mellitus type 2, uncontrolled -A1c of 11.3. -Increase Levemir 40 units daily at bedtime . Home with Levemir flexpen 40 units q hs -d/c Amaryl --will need insulin at time of d/c--pt is agreeable -Continue with sliding scale insulin.  -add pre-meal aspart 6 units 3 times a day with meals. -home with novolog flexpen 6 units with meals -Appreciate diabetic coordinator evaluation and  recommendation. -lipid panel--LDL 91-->lifestyle modification for now -appreciate nutrition  DVT prophylaxis: Subcutaneous Lovenox    Discharge Condition: stable  Disposition: home Follow-up Information    Follow up with DUDA,MARCUS V, MD In 1 week.   Specialty:  Orthopedic Surgery   Contact information:   [phone]    Diet:carb modified Wt Readings from Last 3 Encounters:  10/20/14 100.8 kg (222 lb 3.6 oz)    History of present illness:  [age]ar old male with history of uncontrolled type 2 diabetes mellitus with diabetic foot ulcer over the plantar aspect of his left foot presenting with pain and worsening drainage from the ulcer. He was prescribed oral antibiotics as outpatient 2 days back without improvement. He should admitted and placed on empiric IV antibiotics. MRI of the foot without any abscess or osteomyelitis. Orthopedic surgery consulted. The patient was taken to surgery on 10/17/2014. Irrigation and debridement was performed and surgical cultures were obtained. Blood cultures and cervical cultures were negative. The patient will go home with doxycycline and levofloxacin for 11 days to complete 14 days of therapy from the day of the surgery. The patient's insulin was adjusted during his hospitalization. He will go home with Levemir 40 units daily and NovoLog 6 units with meals. The patient was instructed to check his CBGs before each meal and at bedtime and to keep her glycemic log with which she will take to his primary care provider to adjust his future insulin regimen.  Consultants: Ortho-Duda  Discharge Exam: Filed Vitals:   10/20/14 0551  BP: 107/51  Pulse: 64  Temp: 98.3 F (36.8 C)  Resp: 18   Filed Vitals:   10/19/14 0426 10/19/14 1300 10/19/14 2018 10/20/14 0551  BP:  111/74 113/72 115/66 107/51  Pulse: 81 78 71 64  Temp: 97.6 F (36.4 C) 97.3 F (36.3 C) 98.4 F (36.9 C) 98.3 F (36.8 C)  TempSrc:  Oral Oral Oral Oral  Resp: _0 Height:      Weight: 99.6 kg (219 lb 9.3 oz)   100.8 kg (222 lb 3.6 oz)  SpO2: 100% 99% 98% 96%   General: A&O x 3, NAD, pleasant, cooperative Cardiovascular: RRR, no rub, no gallop, no S3 Respiratory: CTAB, no wheeze, no rhonchi Abdomen:soft, nontender, nondistended, positive bowel sounds Extremities: No edema, No lymphangitis, no petechiae; L-foot wrapped  Discharge Instructions      Discharge Instructions    Ambulatory referral to Nutrition and Diabetic Education    Complete by:  As directed   A1c 10.6%.  Likely to be d/c'd home on insulin.  Wife and patient interested in either group classes or 1:1 session with CDE.  Please speak with wife and patient to determine best course of action.     Diet - low sodium heart healthy    Complete by:  As directed      Increase activity slowly    Complete by:  As directed             Medication List    STOP taking these medications        clindamycin 150 MG capsule  Commonly known as:  CLEOCIN     glimepiride 4 MG tablet  Commonly known as:  AMARYL     ibuprofen 200 MG tablet  Commonly known as:  ADVIL,MOTRIN     sulfamethoxazole-trimethoprim 800-160 MG per tablet  Commonly known as:  SEPTRA DS      TAKE these medications        blood glucose meter kit and supplies Kit  Dispense based on patient and insurance preference. Use up to four times daily as directed. (FOR ICD-9 250.00, 250.01).     doxycycline 100 MG tablet  Commonly known as:  VIBRA-TABS  Take 1 tablet (100 mg total) by mouth 2 (two) times daily.     ferrous sulfate 325 (65 FE) MG tablet  Take 1 tablet (325 mg total) by mouth 2 (two) times daily with a meal.     insulin aspart 100 UNIT/ML FlexPen  Commonly known as:  NOVOLOG  Inject 6 Units into the skin 3 (three) times daily with meals.     Insulin Detemir 100 UNIT/ML Pen  Commonly known as:  LEVEMIR  Inject 40 Units into the skin daily at 10 pm.     Insulin  Pen Needle 32G X 4 MM Misc  Use with insulin pens to dispense insulin as directed     levofloxacin 500 MG tablet  Commonly known as:  LEVAQUIN  Take 1 tablet (500 mg total) by mouth daily.     OMEGA 3 PO  Take 1 capsule by mouth every evening.         The results of significant diagnostics from this hospitalization (including imaging, microbiology, ancillary and laboratory) are listed below for reference.    Significant Diagnostic Studies: Mr Foot Left Wo Contrast  10/14/2014   CLINICAL DATA:  Ulcer along the plantar aspect of the left foot. Worsening infection of the foot.  EXAM: MRI OF THE LEFT FOREFOOT WITHOUT CONTRAST  TECHNIQUE: Multiplanar, multisequence MR imaging was performed. No intravenous contrast was administered.  COMPARISON:  None.  FINDINGS: Soft tissue ulceration along the plantar aspect of the midfoot. There  are a few locules of air within the wound.  There is moderate-severe osteoarthritis of the first MTP joint with subchondral reactive marrow changes. There is mild osteoarthritis of the first IP joint. There is a periarticular erosion along the medial aspect of the distal first proximal phalanx as can be seen with crystalline arthropathy such as gout.  There is mild marrow edema in the medial cuneiform and middle cuneiform which is partially visualized and may be reactive secondary to osteoarthritis versus contusion. There is no other marrow signal abnormality. There is no fracture or dislocation.  There is no fluid collection or hematoma. There is fluid in the first intermetatarsal bursa. There is nonspecific soft tissue edema along the dorsal aspect of the midfoot and forefoot which may be reactive versus secondary to cellulitis. There is increased signal within the musculature which is likely neurogenic.  IMPRESSION: 1. Soft tissue ulceration along the plantar aspect of the midfoot with a few locules of air within the wound. 2. No evidence of osteomyelitis of the left  forefoot. 3. Moderate -severe osteoarthritis of the first MTP joint. Mild osteoarthritis of the first IP joint. Periarticular erosion along the medial aspect of the distal first proximal phalanx as can be seen with crystalline arthropathy such as gout. 4. Mild first intermetatarsal bursitis. 5. Mild marrow edema in the medial cuneiform and middle cuneiform which is partially visualized and may be reactive secondary to osteoarthritis versus contusion.   Electronically Signed   By: Kathreen Devoid   On: 10/14/2014 08:21   Dg Foot Complete Left  10/13/2014   CLINICAL DATA:  Left foot wound with swelling and pain.  EXAM: LEFT FOOT - COMPLETE 3+ VIEW  COMPARISON:  10/03/2014  FINDINGS: Soft tissue swelling is noted.  There is no evidence of acute fracture, subluxation or dislocation.  There is mild cortical irregularity of the lateral first metatarsal head. Osteomyelitis is not excluded.  A calcaneal spur is present.  IMPRESSION: Mild cortical irregularity of the first metatarsal head -osteomyelitis is not excluded. Correlate with wound location.  Soft tissue swelling.   Electronically Signed   By: Margarette Canada M.D.   On: 10/13/2014 18:14   Dg Foot Complete Left  10/03/2014   CLINICAL DATA:  Chronic nonhealing wound at the plantar surface of the foot at the level of the distal third and fourth metatarsals. Diabetic.  EXAM: LEFT FOOT - COMPLETE 3+ VIEW  COMPARISON:  None.  FINDINGS: Lisfranc joint intact. No displaced acute fracture or dislocation. No aggressive osseous lesion. There is soft tissue defect/ ulceration along the plantar surface of the foot at the level of the proximal interphalangeal/MTP joints. No radiopaque foreign body. Posterior and plantar calcaneal enthesophytes. First MTP DJD. Mild atherosclerotic vascular calcifications. Mild calcific densities adjacent to the proximal second and third metatarsal shaft may reflect vascular or other soft tissue calcifications.  IMPRESSION: Plantar wound/ulceration  overlying the MTP joints. No acute osseous finding.  Recommend MRI if concern for osteomyelitis persists.   Electronically Signed   By: Carlos Levering M.D.   On: 10/03/2014 20:51     Microbiology: Recent Results (from the past 240 hour(s))  Culture, blood (routine x 2)     Status: None   Collection Time: 10/13/14  4:53 PM  Result Value Ref Range Status   Specimen Description BLOOD LEFT ARM  Final   Special Requests BOTTLES DRAWN AEROBIC AND ANAEROBIC 10CCS  Final   Culture   Final    NO GROWTH 5 DAYS Note: Culture  results may be compromised due to an excessive volume of blood received in culture bottles. Performed at [facility]    Report Status 10/20/2014 FINAL  Final  Culture, blood (routine x 2)     Status: None   Collection Time: 10/13/14  8:52 PM  Result Value Ref Range Status   Specimen Description BLOOD RIGHT ARM  Final   Special Requests BOTTLES DRAWN AEROBIC AND ANAEROBIC 10CC EA  Final   Culture   Final    NO GROWTH 5 DAYS Note: Culture results may be compromised due to an excessive volume of blood received in culture bottles. Performed at [facility]    Report Status 10/20/2014 FINAL  Final  Wound culture     Status: None   Collection Time: 10/14/14  6:13 AM  Result Value Ref Range Status   Specimen Description WOUND LEFT FOOT  Final   Special Requests Normal  Final   Gram Stain   Final    RARE WBC PRESENT, PREDOMINANTLY PMN NO SQUAMOUS EPITHELIAL CELLS SEEN NO ORGANISMS SEEN Performed at [facility]    Culture   Final    NO GROWTH 2 DAYS Performed at [facility]    Report Status 10/16/2014 FINAL  Final  Surgical pcr screen     Status: None   Collection Time: 10/16/14  9:54 AM  Result Value Ref Range Status   MRSA, PCR NEGATIVE NEGATIVE Final   Staphylococcus aureus NEGATIVE NEGATIVE Final    Comment:        The Xpert SA Assay (FDA approved for NASAL specimens in patients over 44 years of age), is one component  of a comprehensive surveillance program.  Test performance has been validated by [facility] for patients greater than or equal to 37 year old. It is not intended to diagnose infection nor to guide or monitor treatment.   Tissue culture     Status: None (Preliminary result)   Collection Time: 10/17/14  6:30 PM  Result Value Ref Range Status   Specimen Description TISSUE LEFT  FOOT  Final   Special Requests NONE  Final   Gram Stain   Final    FEW WBC PRESENT, PREDOMINANTLY PMN NO SQUAMOUS EPITHELIAL CELLS SEEN NO ORGANISMS SEEN Performed at [facility]    Culture   Final    NO GROWTH 2 DAYS Performed at [facility]    Report Status PENDING  Incomplete     Labs: Basic Metabolic Panel:  Recent Labs Lab 10/14/14 0502 10/15/14 0436 10/18/14 0450 10/19/14 0653 10/20/14 0454  NA 131* 135 131* 133* 135  K 4.5 4.5 4.3 4.2 4.3  CL 103 105 99 99 103  CO2 _0 GLUCOSE 235* 216* 294* 261* 237*  BUN _1 CREATININE 1.46* 1.36* 1.38* 1.58* 1.53*  CALCIUM 8.5 8.7 8.7 8.5 8.6   Liver Function Tests:  Recent Labs Lab 10/14/14 0502  AST 27  ALT 26  ALKPHOS 92  BILITOT 0.8  PROT 6.0  ALBUMIN 2.4*   No results for input(s): LIPASE, AMYLASE in the last 168 hours. No results for input(s): AMMONIA in the last 168 hours. CBC:  Recent Labs Lab 10/13/14 1653 10/14/14 0502 10/15/14 0436 10/18/14 0450  WBC 12.6* 9.3 7.7 7.0  NEUTROABS 10.4*  --   --   --   HGB 12.4* 10.9* 10.4* 11.0*  HCT 36.3* 32.6* 32.2* 33.8*  MCV 86.6 87.2 88.5 88.5  PLT 329 276 287 330   Cardiac Enzymes: No results for input(s): CKTOTAL, CKMB, CKMBINDEX, TROPONINI in the last 168 hours. BNP: Invalid input(s): POCBNP CBG:  Recent Labs Lab 10/19/14 1140 10/19/14 1611 10/19/14 2142 10/20/14 0641 10/20/14 1107  GLUCAP 236* 324* 147* 186* 120*    Time coordinating discharge:  Greater than 30 minutes  Signed:  Palak Tercero, DO [facility] Pager: [phone] 10/20/2014, 12:43 PM

## 2014-10-20 ENCOUNTER — Encounter (HOSPITAL_COMMUNITY): Payer: Self-pay | Admitting: Orthopedic Surgery

## 2014-10-20 LAB — CULTURE, BLOOD (ROUTINE X 2)
Culture: NO GROWTH
Culture: NO GROWTH

## 2014-10-20 LAB — BASIC METABOLIC PANEL
Anion gap: 7 (ref 5–15)
BUN: 14 mg/dL (ref 6–23)
CO2: 25 mmol/L (ref 19–32)
Calcium: 8.6 mg/dL (ref 8.4–10.5)
Chloride: 103 mmol/L (ref 96–112)
Creatinine, Ser: 1.53 mg/dL — ABNORMAL HIGH (ref 0.50–1.35)
GFR calc non Af Amer: 45 mL/min — ABNORMAL LOW (ref >90)
GFR, EST AFRICAN AMERICAN: 52 mL/min — AB (ref >90)
GLUCOSE: 237 mg/dL — AB (ref 70–99)
Potassium: 4.3 mmol/L (ref 3.5–5.1)
Sodium: 135 mmol/L (ref 135–145)

## 2014-10-20 LAB — GLUCOSE, CAPILLARY
GLUCOSE-CAPILLARY: 186 mg/dL — AB (ref 70–99)
Glucose-Capillary: 120 mg/dL — ABNORMAL HIGH (ref 70–99)

## 2014-10-20 MED ORDER — BLOOD GLUCOSE MONITOR KIT: PACK | Status: DC

## 2014-10-20 MED ORDER — INSULIN PEN NEEDLE 32G X 4 MM MISC: Status: DC

## 2014-10-20 MED ORDER — INSULIN ASPART 100 UNIT/ML FLEXPEN: 6.0000 [IU] | PEN_INJECTOR | Freq: Three times a day (TID) | SUBCUTANEOUS | Status: DC

## 2014-10-20 MED ORDER — DOXYCYCLINE HYCLATE 100 MG PO TABS: 100.0000 mg | ORAL_TABLET | Freq: Two times a day (BID) | ORAL | Status: DC

## 2014-10-20 MED ORDER — INSULIN DETEMIR 100 UNIT/ML FLEXPEN: 40.0000 [IU] | PEN_INJECTOR | Freq: Every day | SUBCUTANEOUS | Status: DC

## 2014-10-20 MED ORDER — LEVOFLOXACIN 500 MG PO TABS: 500.0000 mg | ORAL_TABLET | Freq: Every day | ORAL | Status: DC

## 2014-10-20 NOTE — Care Management Note (Signed)
CARE MANAGEMENT NOTE 10/20/2014  Patient:  Kevin Bradford,Kevin Bradford   Account Number:  [id number]  Date Initiated:  10/18/2014  Documentation initiated by:  Oliveras-Aizpurua,Jeannette  Subjective/Objective Assessment:   69 yo M with L wound ulceration, s/p I & D     Action/Plan:   PT is recommending HHPT if confirmed to be [facility]   Anticipated DC Date:  10/20/2014   Anticipated DC Plan:  HOME/SELF CARE      DC Planning Services  CM consult      PAC Choice  DURABLE MEDICAL EQUIPMENT   Choice offered to / List presented to:     DME arranged  [facility]      DME agency  [facility]     [facility]  HH - 11 Patient Refused      Status of service:  Completed, signed off Medicare Important Message given?   (If response is "NO", the following Medicare IM given date fields will be blank) Date Medicare IM given:   Medicare IM given by:   Date Additional Medicare IM given:  10/18/2014 Additional Medicare IM given by:  Isaias CowmanJeannette Oliveras-Aizpurua  Discharge Disposition:  HOME/SELF CARE  Per UR Regulation:    If discussed at Long Length of Stay Meetings, dates discussed:    Comments:  10/20/14 11:00 Vance PeperSusan Kijuan Gallicchio, RN BSN Case Manager CM spoke with patient concerning need for home health physical therapy. Patient states he has had therapy in the past and his neighbor is a therapist, should he need assistance he will call them. patient refuses home health at this time. Requests rolling walker. CM agreed to order. Patient discharging today.

## 2014-10-20 NOTE — Progress Notes (Addendum)
Patient ID: Kevin Bradford, male   DOB: 11-Nov-1945, 69 y.o.   MRN: [id number] Intraoperative tissue cultures are still negative. Patient would be safe for discharge on oral antibiotics. Would recommend doxycycline for 2 weeks. I will follow-up in the office in 1 week. Keep dressing clean dry and intact.

## 2014-10-20 NOTE — Progress Notes (Signed)
PT Cancellation Note  Patient Details Name: Tommas OlpGeorge Foerster MRN: [id number] DOB: 20-Dec-1945   Cancelled Treatment:    Reason Eval/Treat Not Completed: Other (comment) . Patient stating "i know what to do"; "im not gonna cheat". Patient discharging soon and did not feel need to work with therapy. Wife present and stated she felt safe with patients ambulation and weightbearing restrictions.   Fredrich BirksRobinette, Sreenidhi Ganson Elizabeth 10/20/2014, 2:17 PM

## 2014-10-21 LAB — TISSUE CULTURE: Culture: NO GROWTH

## 2015-08-17 DIAGNOSIS — E113513 Type 2 diabetes mellitus with proliferative diabetic retinopathy with macular edema, bilateral: Secondary | ICD-10-CM | POA: Diagnosis not present

## 2015-09-08 DIAGNOSIS — H35372 Puckering of macula, left eye: Secondary | ICD-10-CM | POA: Diagnosis not present

## 2015-09-08 DIAGNOSIS — H43823 Vitreomacular adhesion, bilateral: Secondary | ICD-10-CM | POA: Diagnosis not present

## 2015-09-08 DIAGNOSIS — E113513 Type 2 diabetes mellitus with proliferative diabetic retinopathy with macular edema, bilateral: Secondary | ICD-10-CM | POA: Diagnosis not present

## 2015-11-02 DIAGNOSIS — E113513 Type 2 diabetes mellitus with proliferative diabetic retinopathy with macular edema, bilateral: Secondary | ICD-10-CM | POA: Diagnosis not present

## 2015-11-02 DIAGNOSIS — H3582 Retinal ischemia: Secondary | ICD-10-CM | POA: Diagnosis not present

## 2015-11-02 DIAGNOSIS — H35372 Puckering of macula, left eye: Secondary | ICD-10-CM | POA: Diagnosis not present

## 2015-11-02 DIAGNOSIS — H43823 Vitreomacular adhesion, bilateral: Secondary | ICD-10-CM | POA: Diagnosis not present

## 2015-12-16 DIAGNOSIS — E119 Type 2 diabetes mellitus without complications: Secondary | ICD-10-CM | POA: Diagnosis not present

## 2015-12-16 DIAGNOSIS — R634 Abnormal weight loss: Secondary | ICD-10-CM | POA: Diagnosis not present

## 2015-12-16 DIAGNOSIS — Z79899 Other long term (current) drug therapy: Secondary | ICD-10-CM | POA: Diagnosis not present

## 2015-12-16 DIAGNOSIS — R51 Headache: Secondary | ICD-10-CM | POA: Diagnosis not present

## 2015-12-16 DIAGNOSIS — Z125 Encounter for screening for malignant neoplasm of prostate: Secondary | ICD-10-CM | POA: Diagnosis not present

## 2016-02-09 DIAGNOSIS — H2511 Age-related nuclear cataract, right eye: Secondary | ICD-10-CM | POA: Diagnosis not present

## 2016-02-09 DIAGNOSIS — H18411 Arcus senilis, right eye: Secondary | ICD-10-CM | POA: Diagnosis not present

## 2016-02-09 DIAGNOSIS — E113513 Type 2 diabetes mellitus with proliferative diabetic retinopathy with macular edema, bilateral: Secondary | ICD-10-CM | POA: Diagnosis not present

## 2016-02-09 DIAGNOSIS — H18412 Arcus senilis, left eye: Secondary | ICD-10-CM | POA: Diagnosis not present

## 2016-02-09 DIAGNOSIS — H2512 Age-related nuclear cataract, left eye: Secondary | ICD-10-CM | POA: Diagnosis not present

## 2016-02-22 DIAGNOSIS — H2511 Age-related nuclear cataract, right eye: Secondary | ICD-10-CM | POA: Diagnosis not present

## 2016-02-22 DIAGNOSIS — H2513 Age-related nuclear cataract, bilateral: Secondary | ICD-10-CM | POA: Diagnosis not present

## 2016-02-23 DIAGNOSIS — H2512 Age-related nuclear cataract, left eye: Secondary | ICD-10-CM | POA: Diagnosis not present

## 2016-03-07 DIAGNOSIS — H2513 Age-related nuclear cataract, bilateral: Secondary | ICD-10-CM | POA: Diagnosis not present

## 2016-03-07 DIAGNOSIS — H2512 Age-related nuclear cataract, left eye: Secondary | ICD-10-CM | POA: Diagnosis not present

## 2016-06-27 DIAGNOSIS — E782 Mixed hyperlipidemia: Secondary | ICD-10-CM | POA: Diagnosis not present

## 2016-06-27 DIAGNOSIS — E119 Type 2 diabetes mellitus without complications: Secondary | ICD-10-CM | POA: Diagnosis not present

## 2016-06-27 DIAGNOSIS — R635 Abnormal weight gain: Secondary | ICD-10-CM | POA: Diagnosis not present

## 2016-06-27 DIAGNOSIS — Z6837 Body mass index (BMI) 37.0-37.9, adult: Secondary | ICD-10-CM | POA: Diagnosis not present

## 2017-02-16 DIAGNOSIS — E119 Type 2 diabetes mellitus without complications: Secondary | ICD-10-CM | POA: Diagnosis not present

## 2017-02-16 DIAGNOSIS — R7989 Other specified abnormal findings of blood chemistry: Secondary | ICD-10-CM | POA: Diagnosis not present

## 2017-02-16 DIAGNOSIS — Z6836 Body mass index (BMI) 36.0-36.9, adult: Secondary | ICD-10-CM | POA: Diagnosis not present

## 2017-02-16 DIAGNOSIS — Z1389 Encounter for screening for other disorder: Secondary | ICD-10-CM | POA: Diagnosis not present

## 2017-02-16 DIAGNOSIS — E1165 Type 2 diabetes mellitus with hyperglycemia: Secondary | ICD-10-CM | POA: Diagnosis not present

## 2017-02-16 DIAGNOSIS — E782 Mixed hyperlipidemia: Secondary | ICD-10-CM | POA: Diagnosis not present

## 2017-02-16 DIAGNOSIS — Z713 Dietary counseling and surveillance: Secondary | ICD-10-CM | POA: Diagnosis not present

## 2017-02-16 DIAGNOSIS — E669 Obesity, unspecified: Secondary | ICD-10-CM | POA: Diagnosis not present

## 2017-05-04 ENCOUNTER — Ambulatory Visit (INDEPENDENT_AMBULATORY_CARE_PROVIDER_SITE_OTHER): Payer: Medicare Other | Admitting: Orthopedic Surgery

## 2017-05-04 DIAGNOSIS — E11621 Type 2 diabetes mellitus with foot ulcer: Secondary | ICD-10-CM | POA: Diagnosis not present

## 2017-05-04 DIAGNOSIS — L97421 Non-pressure chronic ulcer of left heel and midfoot limited to breakdown of skin: Secondary | ICD-10-CM | POA: Diagnosis not present

## 2017-05-04 DIAGNOSIS — L02612 Cutaneous abscess of left foot: Secondary | ICD-10-CM

## 2017-05-04 MED ORDER — DOXYCYCLINE HYCLATE 100 MG PO TABS: 100.0000 mg | ORAL_TABLET | Freq: Two times a day (BID) | ORAL | 0 refills | Status: DC

## 2017-05-04 MED ORDER — SILVER SULFADIAZINE 1 % EX CREA: 1.0000 "application " | TOPICAL_CREAM | Freq: Every day | CUTANEOUS | 0 refills | Status: DC

## 2017-05-04 NOTE — Progress Notes (Signed)
I called in prescription to patients pharmacy.

## 2017-05-04 NOTE — Progress Notes (Signed)
Office Visit Note   Patient: Kevin Bradford           Date of Birth: 1945-11-06           MRN: [id number] Visit Date: 05/04/2017              Requested by: No referring provider defined for this encounter. PCP: System, Pcp Not In  Chief Complaint  Patient presents with  . Left Foot - Wound Check      HPI: The patient is a 71 year old gentleman seen today for recurrence of ulceration to left foot. Had issues with a diabetic foot ulcer 2 years ago. Did subsequently undergo irrigation and debridement of left foot ulcer, states healed well following. Has not had problems until a week ago. Noticed ulcer about a week ago. Does point out some elevation of the second and third toes, states has been this way for years.   Some drainage with ulcer, has been applying silvadene daily. No fever or chills.   Assessment & Plan: Visit Diagnoses: No diagnosis found.  Plan: will renew his Silvadene and provide Doxycycline prescription. Provided Darco shoe to offload forefoot. Will do daily wound care. Follow up in 2 weeks. Minimize weight bearing on forefoot.   Follow-Up Instructions: No Follow-up on file.   Ortho Exam  Patient is alert, oriented, no adenopathy, well-dressed, normal affect, normal respiratory effort. On examination of left foot beneath the 2nd metatarsal head has callused ulceration. This is debrided of skin and soft tissue back to viable tissue. Silver nitrate used for hemostasis. Ulceration is 2 cm in diameter and 3 mm deep. There was a satellite abscess which was debrided and drained. Drainage with foul odor. No surrounding erythema or cellulitis. Does have palpable DP pulse.   Imaging: No results found.  No images are attached to the encounter.  Labs: Lab Results  Component Value Date   HGBA1C 11.3 (H) 10/14/2014   HGBA1C 10.6 (H) 10/13/2014   ESRSEDRATE 119 (H) 10/18/2014   ESRSEDRATE 115 (H) 10/13/2014   CRP 8.5 (H) 10/18/2014   CRP 21.9 (H) 10/13/2014   LABURIC  3.3 (L) 10/14/2014   REPTSTATUS 10/21/2014 FINAL 10/17/2014   GRAMSTAIN  10/17/2014    FEW WBC PRESENT, PREDOMINANTLY PMN NO SQUAMOUS EPITHELIAL CELLS SEEN NO ORGANISMS SEEN Performed at [facility]    CULT  10/17/2014    NO GROWTH 3 DAYS Performed at [facility]     Orders:  No orders of the defined types were placed in this encounter.  Meds ordered this encounter  Medications  . doxycycline (VIBRA-TABS) 100 MG tablet    Sig: Take 1 tablet (100 mg total) by mouth 2 (two) times daily.    Dispense:  28 tablet    Refill:  0  . silver sulfADIAZINE (SILVADENE) 1 % cream    Sig: Apply 1 application topically daily.    Dispense:  50 g    Refill:  0     Procedures: No procedures performed  Clinical Data: No additional findings.  ROS:  All other systems negative, except as noted in the HPI. Review of Systems  Constitutional: Negative for chills and fever.  Skin: Positive for wound. Negative for color change.    Objective: Vital Signs: There were no vitals taken for this visit.  Specialty Comments:  No specialty comments available.  PMFS History: Patient Active Problem List   Diagnosis Date Noted  . Cellulitis and abscess of leg 10/18/2014  . Diabetic ulcer  of left foot associated with type 2 diabetes mellitus (HCC)   . Diabetes mellitus with renal complications (HCC) 10/17/2014  . Diabetic ulcer of left foot (HCC) 10/13/2014  . Diabetes (HCC) 10/13/2014  . Anemia 10/13/2014  . CKD (chronic kidney disease) stage 3, GFR 30-59 ml/min (HCC) 10/13/2014  . Hyperkalemia 10/13/2014  . Skin ulcer (HCC) 10/13/2014   Past Medical History:  Diagnosis Date  . CKD (chronic kidney disease) stage 3, GFR 30-59 ml/min   . Diabetes mellitus without complication   . Diabetic foot ulcer    left    Family History  Problem Relation Age of Onset  . Dementia Mother     Past Surgical History:  Procedure Laterality Date  . I&D EXTREMITY Left 10/17/2014    Procedure: IRRIGATION AND DEBRIDEMENT EXTREMITY LEFT FOOT;  Surgeon: Nadara MustardMarcus Duda V, MD;  Location: [facility] OR;  Service: Orthopedics;  Laterality: Left;  . TOE AMPUTATION     Social History   Occupational History  . Not on file.   Social History Main Topics  . Smoking status: Never Smoker  . Smokeless tobacco: Not on file  . Alcohol use 0.6 oz/week    1 Standard drinks or equivalent per week  . Drug use: Unknown  . Sexual activity: Not on file

## 2017-05-18 ENCOUNTER — Encounter (INDEPENDENT_AMBULATORY_CARE_PROVIDER_SITE_OTHER): Payer: Self-pay | Admitting: Orthopedic Surgery

## 2017-05-18 ENCOUNTER — Ambulatory Visit (INDEPENDENT_AMBULATORY_CARE_PROVIDER_SITE_OTHER): Payer: Medicare Other | Admitting: Orthopedic Surgery

## 2017-05-18 VITALS — Ht 74.0 in | Wt 222.0 lb

## 2017-05-18 DIAGNOSIS — L97421 Non-pressure chronic ulcer of left heel and midfoot limited to breakdown of skin: Secondary | ICD-10-CM

## 2017-05-18 DIAGNOSIS — E11621 Type 2 diabetes mellitus with foot ulcer: Secondary | ICD-10-CM

## 2017-05-18 NOTE — Progress Notes (Signed)
Office Visit Note   Patient: Kevin Bradford           Date of Birth: 1946-01-01           MRN: [id number] Visit Date: 05/18/2017              Requested by: No referring provider defined for this encounter. PCP: Patient, No Pcp Per  Chief Complaint  Patient presents with  . Left Foot - Follow-up    Left foot follow up diabetic ulcer. Doxy Bid, Silvadene dressing changes and Darco shoe.       HPI: Patient presents in follow-up for a Wegner grade 1 ulcer third metatarsal head left foot.  Patient has been using Silvadene completed a course of doxycycline and using a Darco shoe.  He has no concerns.  Assessment & Plan: Visit Diagnoses:  1. Diabetic ulcer of left midfoot associated with type 2 diabetes mellitus, limited to breakdown of skin (HCC)     Plan: We will continue with pressure unloading.  Continue with Silvadene dressing changes daily.  Continue with a Darco shoe.  Patient states he is heading to [facility] and will be back in 4 weeks.  Follow-Up Instructions: Return in about 4 weeks (around 06/15/2017).   Ortho Exam  Patient is alert, oriented, no adenopathy, well-dressed, normal affect, normal respiratory effort. Examination patient has good dorsiflexion of the ankle.  There is no redness no cellulitis no signs of infection.  The ulcer does not probe to bone or tendon or fascia.  After informed consent a 10 blade knife was used to debride the skin and soft tissue from around the ulcer.  After debridement the ulcer is 2 cm in diameter 5 mm deep.  Silver nitrate was used for hemostasis Iodosorb plus 2 x 2 gauze plus a Band-Aid was applied.  Patient has good capillary refill.  Imaging: No results found. No images are attached to the encounter.  Labs: Lab Results  Component Value Date   HGBA1C 11.3 (H) 10/14/2014   HGBA1C 10.6 (H) 10/13/2014   ESRSEDRATE 119 (H) 10/18/2014   ESRSEDRATE 115 (H) 10/13/2014   CRP 8.5 (H) 10/18/2014   CRP 21.9 (H) 10/13/2014   LABURIC 3.3  (L) 10/14/2014   REPTSTATUS 10/21/2014 FINAL 10/17/2014   GRAMSTAIN  10/17/2014    FEW WBC PRESENT, PREDOMINANTLY PMN NO SQUAMOUS EPITHELIAL CELLS SEEN NO ORGANISMS SEEN Performed at [facility]    CULT  10/17/2014    NO GROWTH 3 DAYS Performed at [facility]     Orders:  No orders of the defined types were placed in this encounter.  No orders of the defined types were placed in this encounter.    Procedures: No procedures performed  Clinical Data: No additional findings.  ROS:  All other systems negative, except as noted in the HPI. Review of Systems  Objective: Vital Signs: Ht 6\' 2"  (1.88 m)   Wt 222 lb (100.7 kg)   BMI 28.50 kg/m   Specialty Comments:  No specialty comments available.  PMFS History: Patient Active Problem List   Diagnosis Date Noted  . Cellulitis and abscess of leg 10/18/2014  . Diabetic ulcer of left foot associated with type 2 diabetes mellitus (HCC)   . Diabetes mellitus with renal complications (HCC) 10/17/2014  . Diabetic ulcer of left foot (HCC) 10/13/2014  . Diabetes (HCC) 10/13/2014  . Anemia 10/13/2014  . CKD (chronic kidney disease) stage 3, GFR 30-59 ml/min (HCC) 10/13/2014  . Hyperkalemia 10/13/2014  .  Skin ulcer (HCC) 10/13/2014   Past Medical History:  Diagnosis Date  . CKD (chronic kidney disease) stage 3, GFR 30-59 ml/min (HCC)   . Diabetes mellitus without complication (HCC)   . Diabetic foot ulcer (HCC)    left    Family History  Problem Relation Age of Onset  . Dementia Mother     Past Surgical History:  Procedure Laterality Date  . I&D EXTREMITY Left 10/17/2014   Procedure: IRRIGATION AND DEBRIDEMENT EXTREMITY LEFT FOOT;  Surgeon: Nadara MustardMarcus Samiya Mervin V, MD;  Location: [facility] OR;  Service: Orthopedics;  Laterality: Left;  . TOE AMPUTATION     Social History   Occupational History  . Not on file.   Social History Main Topics  . Smoking status: Never Smoker  . Smokeless tobacco: Never Used  .  Alcohol use 0.6 oz/week    1 Standard drinks or equivalent per week  . Drug use: Unknown  . Sexual activity: Not on file

## 2017-05-19 ENCOUNTER — Other Ambulatory Visit (INDEPENDENT_AMBULATORY_CARE_PROVIDER_SITE_OTHER): Payer: Self-pay | Admitting: Family

## 2017-05-19 ENCOUNTER — Telehealth (INDEPENDENT_AMBULATORY_CARE_PROVIDER_SITE_OTHER): Payer: Self-pay | Admitting: Orthopedic Surgery

## 2017-05-19 NOTE — Telephone Encounter (Signed)
Patient was prescribed Doxycycline for 2 weeks from [facility] and since he still has an open wound he would like it refilled. He would like a phone call once it is called in or if he doesn't need to take it anymore let him know [phone].

## 2017-05-19 NOTE — Telephone Encounter (Signed)
I called and spoke with patient there are no signs of infection, redness or cellulitis just at yesterdays visit. Advised to hold off now for antibiotic. He will call with any changes before follow up.

## 2017-05-31 DIAGNOSIS — E1140 Type 2 diabetes mellitus with diabetic neuropathy, unspecified: Secondary | ICD-10-CM | POA: Diagnosis not present

## 2017-05-31 DIAGNOSIS — E11621 Type 2 diabetes mellitus with foot ulcer: Secondary | ICD-10-CM | POA: Diagnosis not present

## 2017-05-31 DIAGNOSIS — Z794 Long term (current) use of insulin: Secondary | ICD-10-CM | POA: Diagnosis not present

## 2017-05-31 DIAGNOSIS — N183 Chronic kidney disease, stage 3 (moderate): Secondary | ICD-10-CM | POA: Diagnosis not present

## 2017-06-19 ENCOUNTER — Encounter (INDEPENDENT_AMBULATORY_CARE_PROVIDER_SITE_OTHER): Payer: Self-pay | Admitting: Orthopedic Surgery

## 2017-06-19 ENCOUNTER — Ambulatory Visit (INDEPENDENT_AMBULATORY_CARE_PROVIDER_SITE_OTHER): Payer: Medicare Other | Admitting: Orthopedic Surgery

## 2017-06-19 DIAGNOSIS — L97421 Non-pressure chronic ulcer of left heel and midfoot limited to breakdown of skin: Secondary | ICD-10-CM | POA: Diagnosis not present

## 2017-06-19 DIAGNOSIS — E11621 Type 2 diabetes mellitus with foot ulcer: Secondary | ICD-10-CM

## 2017-06-19 MED ORDER — SILVER SULFADIAZINE 1 % EX CREA: 1.0000 "application " | TOPICAL_CREAM | Freq: Every day | CUTANEOUS | 1 refills | Status: DC

## 2017-06-19 MED ORDER — NITROGLYCERIN 0.2 MG/HR TD PT24: 0.2000 mg | MEDICATED_PATCH | Freq: Every day | TRANSDERMAL | 12 refills | Status: DC

## 2017-06-19 NOTE — Progress Notes (Signed)
Office Visit Note   Patient: Kevin OlpGeorge Bradford           Date of Birth: Jan 13, 1946           MRN: [id number] Visit Date: 06/19/2017              Requested by: No referring provider defined for this encounter. PCP: Patient, No Pcp Per  Chief Complaint  Patient presents with  . Left Foot - Follow-up      HPI: Patient presents in follow-up for a Wagner grade 1 ulcer third metatarsal head left foot.  Patient has been using Silvadene and using a Darco shoe. Does relate recently travelled for work and had to weight bear in regular shoe wear more that would have liked to.  He has no concerns.  Assessment & Plan: Visit Diagnoses:  1. Diabetic ulcer of left midfoot associated with type 2 diabetes mellitus, limited to breakdown of skin (HCC)     Plan: We will continue with pressure unloading.  Continue with Silvadene dressing changes daily.  Continue with a Darco shoe. Provided pressure relieving donuts. Will also call in Nitro patch.   Follow-Up Instructions: Return in about 4 weeks (around 07/17/2017).   Ortho Exam  Patient is alert, oriented, no adenopathy, well-dressed, normal affect, normal respiratory effort. Examination patient has good dorsiflexion of the ankle.  There is no redness no cellulitis no signs of infection.  The ulcer does not probe to bone or tendon or fascia.  After informed consent a 10 blade knife was used to debride the skin and soft tissue from around the ulcer.  Callused area 2 cm in diameter, central ulceration is 5 mm in diameter and 5 mm deep.  Silver nitrate was used for hemostasis Iodosorb plus 2 x 2 gauze plus a Band-Aid was applied.  Patient has good capillary refill.  Imaging: No results found. No images are attached to the encounter.  Labs: Lab Results  Component Value Date   HGBA1C 11.3 (H) 10/14/2014   HGBA1C 10.6 (H) 10/13/2014   ESRSEDRATE 119 (H) 10/18/2014   ESRSEDRATE 115 (H) 10/13/2014   CRP 8.5 (H) 10/18/2014   CRP 21.9 (H) 10/13/2014   LABURIC 3.3 (L) 10/14/2014   REPTSTATUS 10/21/2014 FINAL 10/17/2014   GRAMSTAIN  10/17/2014    FEW WBC PRESENT, PREDOMINANTLY PMN NO SQUAMOUS EPITHELIAL CELLS SEEN NO ORGANISMS SEEN Performed at [facility]    CULT  10/17/2014    NO GROWTH 3 DAYS Performed at [facility]     Orders:  No orders of the defined types were placed in this encounter.  No orders of the defined types were placed in this encounter.    Procedures: No procedures performed  Clinical Data: No additional findings.  ROS:  All other systems negative, except as noted in the HPI. Review of Systems  Objective: Vital Signs: There were no vitals taken for this visit.  Specialty Comments:  No specialty comments available.  PMFS History: Patient Active Problem List   Diagnosis Date Noted  . Cellulitis and abscess of leg 10/18/2014  . Diabetic ulcer of left foot associated with type 2 diabetes mellitus (HCC)   . Diabetes mellitus with renal complications (HCC) 10/17/2014  . Diabetic ulcer of left foot (HCC) 10/13/2014  . Diabetes (HCC) 10/13/2014  . Anemia 10/13/2014  . CKD (chronic kidney disease) stage 3, GFR 30-59 ml/min (HCC) 10/13/2014  . Hyperkalemia 10/13/2014  . Skin ulcer (HCC) 10/13/2014   Past Medical History:  Diagnosis Date  .  CKD (chronic kidney disease) stage 3, GFR 30-59 ml/min (HCC)   . Diabetes mellitus without complication (HCC)   . Diabetic foot ulcer (HCC)    left    Family History  Problem Relation Age of Onset  . Dementia Mother     Past Surgical History:  Procedure Laterality Date  . I&D EXTREMITY Left 10/17/2014   Procedure: IRRIGATION AND DEBRIDEMENT EXTREMITY LEFT FOOT;  Surgeon: Nadara MustardMarcus Duda V, MD;  Location: [facility] OR;  Service: Orthopedics;  Laterality: Left;  . TOE AMPUTATION     Social History   Occupational History  . Not on file  Tobacco Use  . Smoking status: Never Smoker  . Smokeless tobacco: Never Used  Substance and Sexual Activity  .  Alcohol use: Yes    Alcohol/week: 0.6 oz    Types: 1 Standard drinks or equivalent per week  . Drug use: Not on file  . Sexual activity: Not on file

## 2017-06-21 DIAGNOSIS — E7800 Pure hypercholesterolemia, unspecified: Secondary | ICD-10-CM | POA: Diagnosis not present

## 2017-06-21 DIAGNOSIS — Z794 Long term (current) use of insulin: Secondary | ICD-10-CM | POA: Diagnosis not present

## 2017-06-21 DIAGNOSIS — E1140 Type 2 diabetes mellitus with diabetic neuropathy, unspecified: Secondary | ICD-10-CM | POA: Diagnosis not present

## 2017-07-17 ENCOUNTER — Encounter (HOSPITAL_COMMUNITY): Payer: Self-pay | Admitting: *Deleted

## 2017-07-17 ENCOUNTER — Other Ambulatory Visit: Payer: Self-pay

## 2017-07-17 ENCOUNTER — Ambulatory Visit (INDEPENDENT_AMBULATORY_CARE_PROVIDER_SITE_OTHER): Payer: Medicare Other | Admitting: Orthopedic Surgery

## 2017-07-17 ENCOUNTER — Other Ambulatory Visit (INDEPENDENT_AMBULATORY_CARE_PROVIDER_SITE_OTHER): Payer: Self-pay | Admitting: Family

## 2017-07-17 ENCOUNTER — Encounter (INDEPENDENT_AMBULATORY_CARE_PROVIDER_SITE_OTHER): Payer: Self-pay | Admitting: Family

## 2017-07-17 DIAGNOSIS — L02612 Cutaneous abscess of left foot: Secondary | ICD-10-CM

## 2017-07-17 DIAGNOSIS — M86272 Subacute osteomyelitis, left ankle and foot: Secondary | ICD-10-CM | POA: Insufficient documentation

## 2017-07-17 MED ORDER — DOXYCYCLINE HYCLATE 100 MG PO TABS: 100.0000 mg | ORAL_TABLET | Freq: Two times a day (BID) | ORAL | 0 refills | Status: DC

## 2017-07-17 NOTE — Progress Notes (Signed)
Office Visit Note   Patient: Kevin Bradford           Date of Birth: [birth year]           MRN: [id number] Visit Date: 07/17/2017              Requested by: No referring provider defined for this encounter. PCP: Patient, No Pcp Per  Chief Complaint  Patient presents with  . Left Foot - Follow-up, Wound Check      HPI: Patient is a 71 year old gentleman with diabetic insensate neuropathy status post an ulcer beneath the forefoot beneath the second third and fourth metatarsal heads.  Patient has been treated with oral antibiotics dressing changes a Darco shoe as well as nitroglycerin patch to improve the microcirculation.  Patient states that since 07/11/2017 he has had episodes of flulike symptoms.  Patient states that last night he had a temperature of 102.5.  His temperature is 98.5 today in the office.  Patient states that he has been up on his feet a lot has been traveling he has been in airports which [place]  Assessment & Plan: Visit Diagnoses:  1. Cutaneous abscess of left foot   2. Subacute osteomyelitis, left ankle and foot (HCC)     Plan: With the exposed second third and fourth metatarsal heads and recent febrile illness I feel patient requires surgical intervention.  We will plan for transmetatarsal amputation.  Start him on doxycycline today and then continue doxycycline postoperatively.  Anticipate several days in the hospital.  Discussed that if he is up on his foot weightbearing that the wound would most likely breakdown and would require a higher level amputation.  Patient states he understands wished to proceed with surgery at this time.  Follow-Up Instructions: Return in about 2 weeks (around 07/31/2017).   Ortho Exam  Patient is alert, oriented, no adenopathy, well-dressed, normal affect, normal respiratory effort. Examination patient's foot is warm he has good capillary refill but does not have a  palpable pulse due to swelling.  The ulcer on the plantar aspect of the left foot is larger it now probes down to the second third and fourth metatarsal heads.  He has a Waggoner grade 3 ulceration.  Imaging: No results found. No images are attached to the encounter.  Labs: Lab Results  Component Value Date   HGBA1C 11.3 (H) 10/14/2014   HGBA1C 10.6 (H) 10/13/2014   ESRSEDRATE 119 (H) 10/18/2014   ESRSEDRATE 115 (H) 10/13/2014   CRP 8.5 (H) 10/18/2014   CRP 21.9 (H) 10/13/2014   LABURIC 3.3 (L) 10/14/2014   REPTSTATUS 10/21/2014 FINAL 10/17/2014   GRAMSTAIN  10/17/2014    FEW WBC PRESENT, PREDOMINANTLY PMN NO SQUAMOUS EPITHELIAL CELLS SEEN NO ORGANISMS SEEN Performed at [facility]    CULT  10/17/2014    NO GROWTH 3 DAYS Performed at [facility]     @LABSALLVALUES (HGBA1)@  There is no height or weight on file to calculate BMI.  Orders:  No orders of the defined types were placed in this encounter.  Meds ordered this encounter  Medications  . doxycycline (VIBRA-TABS) 100 MG tablet    Sig: Take 1 tablet (100 mg total) by mouth 2 (two) times daily.    Dispense:  60 tablet    Refill:  0     Procedures: No procedures performed  Clinical Data: No additional findings.  ROS:  All other systems negative, except as noted  in the HPI. Review of Systems  Objective: Vital Signs: There were no vitals taken for this visit.  Specialty Comments:  No specialty comments available.  PMFS History: Patient Active Problem List   Diagnosis Date Noted  . Subacute osteomyelitis, left ankle and foot (HCC) 07/17/2017  . Cutaneous abscess of left foot 07/17/2017  . Cellulitis and abscess of leg 10/18/2014  . Diabetic ulcer of left foot associated with type 2 diabetes mellitus (HCC)   . Diabetes mellitus with renal complications (HCC) 10/17/2014  . Diabetic ulcer of left foot (HCC) 10/13/2014  . Diabetes (HCC) 10/13/2014  . Anemia 10/13/2014  . CKD (chronic  kidney disease) stage 3, GFR 30-59 ml/min (HCC) 10/13/2014  . Hyperkalemia 10/13/2014  . Skin ulcer (HCC) 10/13/2014   Past Medical History:  Diagnosis Date  . CKD (chronic kidney disease) stage 3, GFR 30-59 ml/min (HCC)   . Diabetes mellitus without complication (HCC)   . Diabetic foot ulcer (HCC)    left    Family History  Problem Relation Age of Onset  . Dementia Mother     Past Surgical History:  Procedure Laterality Date  . I&D EXTREMITY Left 10/17/2014   Procedure: IRRIGATION AND DEBRIDEMENT EXTREMITY LEFT FOOT;  Surgeon: Nadara MustardMarcus Wynnie Pacetti V, MD;  Location: [facility] OR;  Service: Orthopedics;  Laterality: Left;  . TOE AMPUTATION     Social History   Occupational History  . Not on file  Tobacco Use  . Smoking status: Never Smoker  . Smokeless tobacco: Never Used  Substance and Sexual Activity  . Alcohol use: Yes    Alcohol/week: 0.6 oz    Types: 1 Standard drinks or equivalent per week  . Drug use: Not on file  . Sexual activity: Not on file

## 2017-07-17 NOTE — Progress Notes (Signed)
Anesthesia Chart Review:  Pt is a same day work up.   Pt is 71 year old male scheduled for L transmetatarsal amputation on 07/19/2017 with Aldean BakerMarcus Duda, MD  PMH includes:  DM, CKD, GERD. Never smoker. BMI 28.5  Medications include: NovoLog, Levemir, metformin, Prilosec. Patient started on doxycycline today 07/17/17 by Dr. Lajoyce Cornersuda.  Labs will be obtained day of surgery.   EKG will be obtained day of surgery.    During telephone call with pre-admission testing RN, pt reported a fever of 102 yesterday and sinus congestion.  He has been taking Nyquil for symptoms.    Pt was seen by Dr. Lajoyce Cornersuda today.  Pt has exposed 2nd, 3rd, 4th metatarsal heads and with febrile, illness, Dr. Lajoyce Cornersuda feels pt requires surgical intervention. Rx doxycycline.    As he is a same day work up and therefore I cannot examine him, it is unclear to me if pt is acutely ill due to osteomyelitis or from some kind of viral illness given sinus congestion complaint. Per Elnita Maxwellheryl in Dr. Audrie Liauda's office, Dr. Lajoyce Cornersuda feels pt needs to proceed with surgery.   Pt will need further assessment by assigned anesthesiologist day of surgery. If no acute URI/flu symptoms, and labs acceptable, I anticipate pt can proceed with surgery as scheduled.   Rica Mastngela Daymein Nunnery, FNP-BC [facility]Anesthesiology Phone: [phone] 07/17/2017 4:20 PM

## 2017-07-17 NOTE — Progress Notes (Signed)
[name] is a Type II.  Patient does not check CBGs on a regular basis.  [name] reports that he has changed PCPs and now sees Dr Eula ListenHussain.  Patient states that he saw Dr Eula ListenHussain in the past couple months, patient did not remember what his A1C was, he did say that Metformin was added at that time.   Patient states that he has been taking Nyquil for a stuffy head , fever,patient said temp was 102 last night, nut normal today patient said he did not tell Dr Lajoyce Cornersuda.  I informed Audree Banengela KabbeFNP-C and she would notify Dr Audrie Liauda's office and  That patient should not take Nyquil within 24 hours of surgery.  I encouraged patient o check CBG 4 times a day tomorrow and see if anything he is eating is causing CBG to be elevated.  I instructed patient to take 1/2 of scheduled Levemir the evening prior to surgery,  I instructed patient to check CBG every 2 hours after he wakens on the day of surgery, if CBG < 70 to drink 1/2 of apple juice and recheck CBG in 15 minutes.  I instructed patient that if CBG > 220 to call pre- op desk, I called back and left the phone number to pre op on voice mail. (patient did not have pen or paper).

## 2017-07-19 ENCOUNTER — Inpatient Hospital Stay (HOSPITAL_COMMUNITY): Payer: Medicare Other

## 2017-07-19 ENCOUNTER — Inpatient Hospital Stay (HOSPITAL_COMMUNITY): Payer: Medicare Other | Admitting: Emergency Medicine

## 2017-07-19 ENCOUNTER — Encounter (HOSPITAL_COMMUNITY)
Admission: RE | Disposition: A | Discharge status: Home Health | Payer: Self-pay | Source: Ambulatory Visit | Attending: Orthopedic Surgery

## 2017-07-19 ENCOUNTER — Other Ambulatory Visit: Payer: Self-pay

## 2017-07-19 ENCOUNTER — Inpatient Hospital Stay (HOSPITAL_COMMUNITY)
Admission: RE | Admit: 2017-07-19 | Discharge: 2017-07-21 | DRG: 475 | Disposition: A | Discharge status: Home Health | Payer: Medicare Other | Source: Ambulatory Visit | Attending: Orthopedic Surgery | Admitting: Orthopedic Surgery

## 2017-07-19 ENCOUNTER — Encounter (HOSPITAL_COMMUNITY): Payer: Self-pay

## 2017-07-19 DIAGNOSIS — B9689 Other specified bacterial agents as the cause of diseases classified elsewhere: Secondary | ICD-10-CM | POA: Diagnosis present

## 2017-07-19 DIAGNOSIS — T8781 Dehiscence of amputation stump: Secondary | ICD-10-CM | POA: Diagnosis not present

## 2017-07-19 DIAGNOSIS — L97529 Non-pressure chronic ulcer of other part of left foot with unspecified severity: Secondary | ICD-10-CM | POA: Diagnosis not present

## 2017-07-19 DIAGNOSIS — M868X7 Other osteomyelitis, ankle and foot: Secondary | ICD-10-CM | POA: Diagnosis present

## 2017-07-19 DIAGNOSIS — E1122 Type 2 diabetes mellitus with diabetic chronic kidney disease: Secondary | ICD-10-CM | POA: Diagnosis not present

## 2017-07-19 DIAGNOSIS — Z794 Long term (current) use of insulin: Secondary | ICD-10-CM | POA: Diagnosis not present

## 2017-07-19 DIAGNOSIS — L02612 Cutaneous abscess of left foot: Secondary | ICD-10-CM | POA: Diagnosis not present

## 2017-07-19 DIAGNOSIS — Z01811 Encounter for preprocedural respiratory examination: Secondary | ICD-10-CM

## 2017-07-19 DIAGNOSIS — E1140 Type 2 diabetes mellitus with diabetic neuropathy, unspecified: Secondary | ICD-10-CM | POA: Diagnosis not present

## 2017-07-19 DIAGNOSIS — R509 Fever, unspecified: Secondary | ICD-10-CM | POA: Diagnosis not present

## 2017-07-19 DIAGNOSIS — Y835 Amputation of limb(s) as the cause of abnormal reaction of the patient, or of later complication, without mention of misadventure at the time of the procedure: Secondary | ICD-10-CM | POA: Diagnosis present

## 2017-07-19 DIAGNOSIS — E1169 Type 2 diabetes mellitus with other specified complication: Secondary | ICD-10-CM | POA: Diagnosis present

## 2017-07-19 DIAGNOSIS — N183 Chronic kidney disease, stage 3 (moderate): Secondary | ICD-10-CM | POA: Diagnosis not present

## 2017-07-19 DIAGNOSIS — Z79899 Other long term (current) drug therapy: Secondary | ICD-10-CM

## 2017-07-19 DIAGNOSIS — K219 Gastro-esophageal reflux disease without esophagitis: Secondary | ICD-10-CM | POA: Diagnosis not present

## 2017-07-19 DIAGNOSIS — E11621 Type 2 diabetes mellitus with foot ulcer: Secondary | ICD-10-CM | POA: Diagnosis present

## 2017-07-19 DIAGNOSIS — Z89432 Acquired absence of left foot: Secondary | ICD-10-CM

## 2017-07-19 DIAGNOSIS — M86272 Subacute osteomyelitis, left ankle and foot: Secondary | ICD-10-CM

## 2017-07-19 HISTORY — PX: AMPUTATION: SHX166

## 2017-07-19 HISTORY — DX: Gastro-esophageal reflux disease without esophagitis: K21.9

## 2017-07-19 LAB — BASIC METABOLIC PANEL
ANION GAP: 13 (ref 5–15)
BUN: 21 mg/dL — ABNORMAL HIGH (ref 6–20)
CO2: 18 mmol/L — AB (ref 22–32)
Calcium: 8.7 mg/dL — ABNORMAL LOW (ref 8.9–10.3)
Chloride: 103 mmol/L (ref 101–111)
Creatinine, Ser: 1.66 mg/dL — ABNORMAL HIGH (ref 0.61–1.24)
GFR calc Af Amer: 46 mL/min — ABNORMAL LOW (ref >60)
GFR, EST NON AFRICAN AMERICAN: 40 mL/min — AB (ref >60)
GLUCOSE: 208 mg/dL — AB (ref 65–99)
POTASSIUM: 4.3 mmol/L (ref 3.5–5.1)
Sodium: 134 mmol/L — ABNORMAL LOW (ref 135–145)

## 2017-07-19 LAB — GLUCOSE, CAPILLARY
GLUCOSE-CAPILLARY: 221 mg/dL — AB (ref 65–99)
Glucose-Capillary: 191 mg/dL — ABNORMAL HIGH (ref 65–99)
Glucose-Capillary: 188 mg/dL — ABNORMAL HIGH (ref 65–99)
Glucose-Capillary: 176 mg/dL — ABNORMAL HIGH (ref 65–99)
Glucose-Capillary: 218 mg/dL — ABNORMAL HIGH (ref 65–99)

## 2017-07-19 LAB — HEMOGLOBIN A1C
Hgb A1c MFr Bld: 9.3 % — ABNORMAL HIGH (ref 4.8–5.6)
Mean Plasma Glucose: 220.21 mg/dL

## 2017-07-19 LAB — CBC
HCT: 35.2 % — ABNORMAL LOW (ref 39.0–52.0)
Hemoglobin: 11.8 g/dL — ABNORMAL LOW (ref 13.0–17.0)
MCH: 30.1 pg (ref 26.0–34.0)
MCHC: 33.5 g/dL (ref 30.0–36.0)
MCV: 89.8 fL (ref 78.0–100.0)
PLATELETS: 213 10*3/uL (ref 150–400)
RBC: 3.92 MIL/uL — AB (ref 4.22–5.81)
RDW: 13.6 % (ref 11.5–15.5)
WBC: 12.5 10*3/uL — ABNORMAL HIGH (ref 4.0–10.5)

## 2017-07-19 SURGERY — AMPUTATION, FOOT, PARTIAL
Anesthesia: General | Site: Foot | Laterality: Left

## 2017-07-19 MED ORDER — METOCLOPRAMIDE HCL 5 MG/ML IJ SOLN: 5.0000 mg | Freq: Three times a day (TID) | INTRAMUSCULAR | Status: DC | PRN

## 2017-07-19 MED ORDER — ONDANSETRON HCL 4 MG PO TABS: 4.0000 mg | ORAL_TABLET | Freq: Four times a day (QID) | ORAL | Status: DC | PRN

## 2017-07-19 MED ORDER — ASPIRIN EC 325 MG PO TBEC
325.0000 mg | DELAYED_RELEASE_TABLET | Freq: Every day | ORAL | Status: DC
  Administered 2017-07-19 – 2017-07-21 (×3): 325 mg via ORAL
  Filled 2017-07-19 (×3): qty 1

## 2017-07-19 MED ORDER — PROPOFOL 10 MG/ML IV BOLUS
INTRAVENOUS | Status: AC
  Filled 2017-07-19: qty 20

## 2017-07-19 MED ORDER — INSULIN DETEMIR 100 UNIT/ML ~~LOC~~ SOLN
20.0000 [IU] | Freq: Every day | SUBCUTANEOUS | Status: DC
  Administered 2017-07-19 – 2017-07-20 (×2): 20 [IU] via SUBCUTANEOUS
  Filled 2017-07-19 (×3): qty 0.2

## 2017-07-19 MED ORDER — METHOCARBAMOL 500 MG PO TABS
500.0000 mg | ORAL_TABLET | Freq: Four times a day (QID) | ORAL | Status: DC | PRN
  Administered 2017-07-19 – 2017-07-20 (×2): 500 mg via ORAL
  Filled 2017-07-19 (×2): qty 1

## 2017-07-19 MED ORDER — INSULIN ASPART 100 UNIT/ML ~~LOC~~ SOLN
4.0000 [IU] | Freq: Three times a day (TID) | SUBCUTANEOUS | Status: DC
  Administered 2017-07-19 – 2017-07-21 (×6): 4 [IU] via SUBCUTANEOUS

## 2017-07-19 MED ORDER — METOCLOPRAMIDE HCL 5 MG PO TABS: 5.0000 mg | ORAL_TABLET | Freq: Three times a day (TID) | ORAL | Status: DC | PRN

## 2017-07-19 MED ORDER — METOCLOPRAMIDE HCL 5 MG/ML IJ SOLN
INTRAMUSCULAR | Status: DC | PRN
  Administered 2017-07-19: 10 mg via INTRAVENOUS

## 2017-07-19 MED ORDER — OXYCODONE HCL 5 MG PO TABS: 5.0000 mg | ORAL_TABLET | ORAL | Status: DC | PRN

## 2017-07-19 MED ORDER — ONDANSETRON HCL 4 MG/2ML IJ SOLN: 4.0000 mg | Freq: Once | INTRAMUSCULAR | Status: DC | PRN

## 2017-07-19 MED ORDER — FENTANYL CITRATE (PF) 100 MCG/2ML IJ SOLN: 25.0000 ug | INTRAMUSCULAR | Status: DC | PRN

## 2017-07-19 MED ORDER — FENTANYL CITRATE (PF) 100 MCG/2ML IJ SOLN
100.0000 ug | Freq: Once | INTRAMUSCULAR | Status: DC
  Filled 2017-07-19: qty 2

## 2017-07-19 MED ORDER — OXYCODONE HCL 5 MG PO TABS: 5.0000 mg | ORAL_TABLET | Freq: Once | ORAL | Status: DC | PRN

## 2017-07-19 MED ORDER — MAGNESIUM CITRATE PO SOLN: 1.0000 | Freq: Once | ORAL | Status: DC | PRN

## 2017-07-19 MED ORDER — INSULIN ASPART 100 UNIT/ML ~~LOC~~ SOLN
0.0000 [IU] | Freq: Three times a day (TID) | SUBCUTANEOUS | Status: DC
  Administered 2017-07-19 – 2017-07-20 (×3): 3 [IU] via SUBCUTANEOUS
  Administered 2017-07-20: 5 [IU] via SUBCUTANEOUS
  Administered 2017-07-21 (×2): 3 [IU] via SUBCUTANEOUS

## 2017-07-19 MED ORDER — INSULIN ASPART 100 UNIT/ML ~~LOC~~ SOLN
5.0000 [IU] | Freq: Once | SUBCUTANEOUS | Status: AC
  Administered 2017-07-19: 5 [IU] via SUBCUTANEOUS

## 2017-07-19 MED ORDER — ACETAMINOPHEN 325 MG PO TABS
650.0000 mg | ORAL_TABLET | ORAL | Status: DC | PRN
  Administered 2017-07-19: 650 mg via ORAL
  Filled 2017-07-19: qty 2

## 2017-07-19 MED ORDER — CEFAZOLIN SODIUM-DEXTROSE 2-4 GM/100ML-% IV SOLN
2.0000 g | Freq: Four times a day (QID) | INTRAVENOUS | Status: DC
  Administered 2017-07-19 – 2017-07-21 (×7): 2 g via INTRAVENOUS
  Filled 2017-07-19 (×9): qty 100

## 2017-07-19 MED ORDER — OXYCODONE HCL 5 MG/5ML PO SOLN: 5.0000 mg | Freq: Once | ORAL | Status: DC | PRN

## 2017-07-19 MED ORDER — FENTANYL CITRATE (PF) 250 MCG/5ML IJ SOLN
INTRAMUSCULAR | Status: AC
  Filled 2017-07-19: qty 5

## 2017-07-19 MED ORDER — HYDROMORPHONE HCL 1 MG/ML IJ SOLN: 1.0000 mg | INTRAMUSCULAR | Status: DC | PRN

## 2017-07-19 MED ORDER — PANTOPRAZOLE SODIUM 40 MG PO TBEC
40.0000 mg | DELAYED_RELEASE_TABLET | Freq: Every day | ORAL | Status: DC
  Administered 2017-07-19 – 2017-07-21 (×3): 40 mg via ORAL
  Filled 2017-07-19 (×3): qty 1

## 2017-07-19 MED ORDER — LACTATED RINGERS IV SOLN
INTRAVENOUS | Status: DC
  Administered 2017-07-19 (×2): via INTRAVENOUS

## 2017-07-19 MED ORDER — FENTANYL CITRATE (PF) 250 MCG/5ML IJ SOLN
INTRAMUSCULAR | Status: DC | PRN
  Administered 2017-07-19 (×2): 50 ug via INTRAVENOUS

## 2017-07-19 MED ORDER — INSULIN ASPART 100 UNIT/ML ~~LOC~~ SOLN
SUBCUTANEOUS | Status: AC
  Filled 2017-07-19: qty 1

## 2017-07-19 MED ORDER — OXYCODONE HCL 5 MG PO TABS
10.0000 mg | ORAL_TABLET | ORAL | Status: DC | PRN
  Administered 2017-07-19: 10 mg via ORAL
  Filled 2017-07-19: qty 2

## 2017-07-19 MED ORDER — POLYETHYLENE GLYCOL 3350 17 G PO PACK: 17.0000 g | PACK | Freq: Every day | ORAL | Status: DC | PRN

## 2017-07-19 MED ORDER — 0.9 % SODIUM CHLORIDE (POUR BTL) OPTIME
TOPICAL | Status: DC | PRN
  Administered 2017-07-19: 1000 mL

## 2017-07-19 MED ORDER — ACETAMINOPHEN 650 MG RE SUPP: 650.0000 mg | RECTAL | Status: DC | PRN

## 2017-07-19 MED ORDER — HYDROCODONE-ACETAMINOPHEN 10-325 MG PO TABS
1.0000 | ORAL_TABLET | Freq: Four times a day (QID) | ORAL | Status: DC | PRN
  Administered 2017-07-20 – 2017-07-21 (×3): 1 via ORAL
  Filled 2017-07-19 (×4): qty 1

## 2017-07-19 MED ORDER — MIDAZOLAM HCL 2 MG/2ML IJ SOLN
2.0000 mg | Freq: Once | INTRAMUSCULAR | Status: DC
  Filled 2017-07-19: qty 2

## 2017-07-19 MED ORDER — ONDANSETRON HCL 4 MG/2ML IJ SOLN
INTRAMUSCULAR | Status: DC | PRN
  Administered 2017-07-19: 4 mg via INTRAVENOUS

## 2017-07-19 MED ORDER — LIDOCAINE 2% (20 MG/ML) 5 ML SYRINGE
INTRAMUSCULAR | Status: AC
  Filled 2017-07-19: qty 5

## 2017-07-19 MED ORDER — BISACODYL 10 MG RE SUPP: 10.0000 mg | Freq: Every day | RECTAL | Status: DC | PRN

## 2017-07-19 MED ORDER — DOCUSATE SODIUM 100 MG PO CAPS
100.0000 mg | ORAL_CAPSULE | Freq: Two times a day (BID) | ORAL | Status: DC
  Administered 2017-07-19 – 2017-07-20 (×4): 100 mg via ORAL
  Filled 2017-07-19 (×5): qty 1

## 2017-07-19 MED ORDER — CHLORHEXIDINE GLUCONATE 4 % EX LIQD: 60.0000 mL | Freq: Once | CUTANEOUS | Status: DC

## 2017-07-19 MED ORDER — ONDANSETRON HCL 4 MG/2ML IJ SOLN: 4.0000 mg | Freq: Four times a day (QID) | INTRAMUSCULAR | Status: DC | PRN

## 2017-07-19 MED ORDER — OMEPRAZOLE MAGNESIUM 20 MG PO TBEC: 20.0000 mg | DELAYED_RELEASE_TABLET | Freq: Every day | ORAL | Status: DC

## 2017-07-19 MED ORDER — SUCCINYLCHOLINE CHLORIDE 20 MG/ML IJ SOLN
INTRAMUSCULAR | Status: DC | PRN
  Administered 2017-07-19: 140 mg via INTRAVENOUS

## 2017-07-19 MED ORDER — MENTHOL 3 MG MT LOZG
1.0000 | LOZENGE | OROMUCOSAL | Status: DC | PRN
  Administered 2017-07-19: 3 mg via ORAL
  Filled 2017-07-19: qty 9

## 2017-07-19 MED ORDER — SUCCINYLCHOLINE CHLORIDE 200 MG/10ML IV SOSY
PREFILLED_SYRINGE | INTRAVENOUS | Status: AC
  Filled 2017-07-19: qty 10

## 2017-07-19 MED ORDER — SODIUM CHLORIDE 0.9 % IV SOLN
INTRAVENOUS | Status: DC
  Administered 2017-07-19: 15:00:00 via INTRAVENOUS

## 2017-07-19 MED ORDER — DEXAMETHASONE SODIUM PHOSPHATE 10 MG/ML IJ SOLN
INTRAMUSCULAR | Status: AC
  Filled 2017-07-19: qty 1

## 2017-07-19 MED ORDER — PROPOFOL 10 MG/ML IV BOLUS
INTRAVENOUS | Status: DC | PRN
  Administered 2017-07-19: 160 mg via INTRAVENOUS

## 2017-07-19 MED ORDER — CEFAZOLIN SODIUM-DEXTROSE 2-4 GM/100ML-% IV SOLN
2.0000 g | INTRAVENOUS | Status: AC
  Administered 2017-07-19: 2 g via INTRAVENOUS
  Filled 2017-07-19: qty 100

## 2017-07-19 MED ORDER — METHOCARBAMOL 1000 MG/10ML IJ SOLN
500.0000 mg | Freq: Four times a day (QID) | INTRAVENOUS | Status: DC | PRN
  Filled 2017-07-19: qty 5

## 2017-07-19 MED ORDER — LIDOCAINE 2% (20 MG/ML) 5 ML SYRINGE
INTRAMUSCULAR | Status: DC | PRN
  Administered 2017-07-19: 40 mg via INTRAVENOUS

## 2017-07-19 MED ORDER — METFORMIN HCL 500 MG PO TABS
500.0000 mg | ORAL_TABLET | Freq: Two times a day (BID) | ORAL | Status: DC
  Administered 2017-07-19 – 2017-07-21 (×4): 500 mg via ORAL
  Filled 2017-07-19 (×3): qty 1

## 2017-07-19 MED ORDER — ONDANSETRON HCL 4 MG/2ML IJ SOLN
INTRAMUSCULAR | Status: AC
  Filled 2017-07-19: qty 2

## 2017-07-19 SURGICAL SUPPLY — 37 items
BENZOIN TINCTURE PRP APPL 2/3 (GAUZE/BANDAGES/DRESSINGS) ×3 IMPLANT
BLADE SAW SGTL HD 18.5X60.5X1. (BLADE) ×3 IMPLANT
BLADE SURG 21 STRL SS (BLADE) ×3 IMPLANT
BNDG COHESIVE 4X5 TAN STRL (GAUZE/BANDAGES/DRESSINGS) IMPLANT
BNDG GAUZE ELAST 4 BULKY (GAUZE/BANDAGES/DRESSINGS) IMPLANT
COVER SURGICAL LIGHT HANDLE (MISCELLANEOUS) ×3 IMPLANT
DRAPE INCISE IOBAN 66X45 STRL (DRAPES) ×3 IMPLANT
DRAPE U-SHAPE 47X51 STRL (DRAPES) ×3 IMPLANT
DRSG ADAPTIC 3X8 NADH LF (GAUZE/BANDAGES/DRESSINGS) IMPLANT
DRSG PAD ABDOMINAL 8X10 ST (GAUZE/BANDAGES/DRESSINGS) IMPLANT
DURAPREP 26ML APPLICATOR (WOUND CARE) ×3 IMPLANT
ELECT REM PT RETURN 9FT ADLT (ELECTROSURGICAL) ×3
ELECTRODE REM PT RTRN 9FT ADLT (ELECTROSURGICAL) ×1 IMPLANT
GAUZE SPONGE 4X4 12PLY STRL (GAUZE/BANDAGES/DRESSINGS) IMPLANT
GLOVE BIOGEL PI IND STRL 9 (GLOVE) ×1 IMPLANT
GLOVE BIOGEL PI INDICATOR 9 (GLOVE) ×2
GLOVE SURG ORTHO 9.0 STRL STRW (GLOVE) ×3 IMPLANT
GOWN STRL REUS W/ TWL XL LVL3 (GOWN DISPOSABLE) ×3 IMPLANT
GOWN STRL REUS W/TWL XL LVL3 (GOWN DISPOSABLE) ×6
KIT BASIN OR (CUSTOM PROCEDURE TRAY) ×3 IMPLANT
KIT DRSG PREVENA PLUS 7DAY 125 (MISCELLANEOUS) ×3 IMPLANT
KIT PREVENA INCISION MGT 13 (CANNISTER) ×3 IMPLANT
KIT ROOM TURNOVER OR (KITS) ×3 IMPLANT
NS IRRIG 1000ML POUR BTL (IV SOLUTION) ×3 IMPLANT
PACK ORTHO EXTREMITY (CUSTOM PROCEDURE TRAY) ×3 IMPLANT
PAD ARMBOARD 7.5X6 YLW CONV (MISCELLANEOUS) ×6 IMPLANT
SPONGE LAP 18X18 X RAY DECT (DISPOSABLE) ×3 IMPLANT
SUT ETHILON 2 0 PSLX (SUTURE) ×3 IMPLANT
SUT VIC AB 2-0 CTB1 (SUTURE) IMPLANT
SWAB COLLECTION DEVICE MRSA (MISCELLANEOUS) ×3 IMPLANT
SWAB CULTURE ESWAB REG 1ML (MISCELLANEOUS) ×3 IMPLANT
TOWEL OR 17X24 6PK STRL BLUE (TOWEL DISPOSABLE) IMPLANT
TOWEL OR 17X26 10 PK STRL BLUE (TOWEL DISPOSABLE) ×3 IMPLANT
TUBE CONNECTING 12'X1/4 (SUCTIONS) ×1
TUBE CONNECTING 12X1/4 (SUCTIONS) ×2 IMPLANT
WATER STERILE IRR 1000ML POUR (IV SOLUTION) IMPLANT
YANKAUER SUCT BULB TIP NO VENT (SUCTIONS) ×3 IMPLANT

## 2017-07-19 NOTE — Anesthesia Postprocedure Evaluation (Signed)
Anesthesia Post Note  Patient: Kevin Bradford  Procedure(s) Performed: LEFT TRANSMETATARSAL AMPUTATION (Left Foot)     Patient location during evaluation: PACU Anesthesia Type: General Level of consciousness: awake, awake and alert and oriented Pain management: pain level controlled Vital Signs Assessment: post-procedure vital signs reviewed and stable Respiratory status: spontaneous breathing, nonlabored ventilation and respiratory function stable Cardiovascular status: blood pressure returned to baseline Anesthetic complications: no    Last Vitals:  Vitals:   07/19/17 1415 07/19/17 1430  BP: 113/75 (!) 125/110  Pulse: 97 95  Resp: 15 13  Temp: 37.5 C   SpO2: 98% 98%    Last Pain:  Vitals:   07/19/17 1415  TempSrc:   PainSc: 1                  [name]

## 2017-07-19 NOTE — Transfer of Care (Signed)
Immediate Anesthesia Transfer of Care Note  Patient: Kevin OlpGeorge Bradford  Procedure(s) Performed: LEFT TRANSMETATARSAL AMPUTATION (Left Foot)  Patient Location: PACU  Anesthesia Type:General  Level of Consciousness: drowsy and patient cooperative  Airway & Oxygen Therapy: Patient Spontanous Breathing and Patient connected to face mask oxygen  Post-op Assessment: Report given to RN and Post -op Vital signs reviewed and stable  Post vital signs: Reviewed and stable  Last Vitals:  Vitals:   07/19/17 1155 07/19/17 1345  BP:    Pulse:  (!) 102  Resp:  11  Temp: (!) 38.8 C 37.5 C  SpO2:  99%    Last Pain:  Vitals:   07/19/17 1101  TempSrc:   PainSc: 5       Patients Stated Pain Goal: 0 (07/19/17 1101)  Complications: No apparent anesthesia complications

## 2017-07-19 NOTE — H&P (Signed)
Kevin Bradford is an 72 y.o. male.   Chief Complaint: Osteomyelitis ulceration left transmetatarsal amputation. HPI: Patient is a 72 year old gentleman status post transmetatarsal amputation of the left.  Patient has had developed progressive ulceration with wound breakdown.  Due to failure of conservative care patient presents at this time for revision transmetatarsal amputation.  Past Medical History:  Diagnosis Date  . CKD (chronic kidney disease) stage 3, GFR 30-59 ml/min (HCC)    creatinine increases with antibiotics per pt, no known kidney disease per patient  . Diabetes mellitus without complication (Kevin Bradford)   . Diabetic foot ulcer (Kevin Bradford)    left  . GERD (gastroesophageal reflux disease)   . Neuropathy     Past Surgical History:  Procedure Laterality Date  . I&D EXTREMITY Left 10/17/2014   Procedure: IRRIGATION AND DEBRIDEMENT EXTREMITY LEFT FOOT;  Surgeon: Kevin Minion, MD;  Location: [facility];  Service: Orthopedics;  Laterality: Left;  . TOE AMPUTATION Right    5th toe    Family History  Problem Relation Age of Onset  . Dementia Mother    Social History:  reports that  has never smoked. he has never used smokeless tobacco. He reports that he drinks alcohol. He reports that he does not use drugs.  Allergies: No Known Allergies  Medications Prior to Admission  Medication Sig Dispense Refill  . doxycycline (VIBRA-TABS) 100 MG tablet Take 1 tablet (100 mg total) by mouth 2 (two) times daily. 60 tablet 0  . insulin aspart (NOVOLOG) 100 UNIT/ML FlexPen Inject 6 Units into the skin 3 (three) times daily with meals. (Patient taking differently: Inject 8 Units into the skin 3 (three) times daily after meals. ) 15 mL 1  . Insulin Detemir (LEVEMIR) 100 UNIT/ML Pen Inject 40 Units into the skin daily at 10 pm. (Patient taking differently: Inject 40 Units into the skin at bedtime. ) 15 mL 1  . metFORMIN (GLUCOPHAGE) 500 MG tablet Take 500 mg by mouth 2 (two) times daily with a meal.     .  naproxen sodium (ALEVE) 220 MG tablet Take 220 mg by mouth 2 (two) times daily as needed (for pain or headache).    . nitroGLYCERIN (NITRODUR - DOSED IN MG/24 HR) 0.2 mg/hr patch Place 1 patch (0.2 mg total) onto the skin daily. 30 patch 12  . Omega-3 Fatty Acids (OMEGA 3 PO) Take 1 capsule by mouth every evening.    Marland Kitchen omeprazole (PRILOSEC OTC) 20 MG tablet Take 20 mg by mouth daily.    . silver sulfADIAZINE (SILVADENE) 1 % cream Apply 1 application topically daily. 400 g 1  . blood glucose meter kit and supplies KIT Dispense based on patient and insurance preference. Use up to four times daily as directed. (FOR ICD-9 250.00, 250.01). (Patient not taking: Reported on 07/17/2017) 1 each 0  . ferrous sulfate 325 (65 FE) MG tablet Take 1 tablet (325 mg total) by mouth 2 (two) times daily with a meal. (Patient not taking: Reported on 07/17/2017) 60 tablet 3  . Insulin Pen Needle 32G X 4 MM MISC Use with insulin pens to dispense insulin as directed (Patient not taking: Reported on 07/17/2017) 100 each 1    Results for orders placed or performed during the hospital encounter of 07/19/17 (from the past 48 hour(s))  CBC     Status: Abnormal   Collection Time: 07/19/17 10:11 AM  Result Value Ref Range   WBC 12.5 (H) 4.0 - 10.5 K/uL   RBC 3.92 (L) 4.22 -  5.81 MIL/uL   Hemoglobin 11.8 (L) 13.0 - 17.0 g/dL   HCT 35.2 (L) 39.0 - 52.0 %   MCV 89.8 78.0 - 100.0 fL   MCH 30.1 26.0 - 34.0 pg   MCHC 33.5 30.0 - 36.0 g/dL   RDW 13.6 11.5 - 15.5 %   Platelets 213 150 - 400 K/uL  Glucose, capillary     Status: Abnormal   Collection Time: 07/19/17 10:34 AM  Result Value Ref Range   Glucose-Capillary 191 (H) 65 - 99 mg/dL   Comment 1 Notify RN    Comment 2 Document in Chart    Dg Chest 2 View  Result Date: 07/19/2017 CLINICAL DATA:  72 year old male with a history of foot infection and a fever EXAM: CHEST  2 VIEW COMPARISON:  None. FINDINGS: Cardiomediastinal silhouette within normal limits. Coarse  interstitial markings with no confluent airspace disease. No pleural effusion or pneumothorax. No displaced fracture IMPRESSION: Likely chronic lung changes without evidence of superimposed acute cardiopulmonary disease Electronically Signed   By: [name] D.O.   On: 07/19/2017 11:20    Review of Systems  All other systems reviewed and are negative.   Blood pressure (!) 144/81, pulse (!) 108, temperature (!) 101.9 F (38.8 C), resp. rate 20, height 6' 2"  (1.88 m), weight 222 lb (100.7 kg), SpO2 100 %. Physical Exam  Examination patient is alert oriented no adenopathy well-dressed normal affect normal respiratory effort he does have an antalgic gait.  Patient has a palpable dorsalis pedis pulse he has ulceration with exposed bone left transmetatarsal amputation. Assessment/Plan Assessment: Dehiscence with osteomyelitis left transmetatarsal amputation.  Plan: We will plan for revision of the transmetatarsal amputation.  Risks and benefits were discussed including persistent infection nonhealing of the wound need for higher level amputation.  Patient states he understands wished to proceed at this time.  Kevin Minion, MD 07/19/2017, 12:02 PM

## 2017-07-19 NOTE — Anesthesia Procedure Notes (Signed)
Procedure Name: Intubation Date/Time: 07/19/2017 1:18 PM Performed by: Freddie Breech, CRNA Pre-anesthesia Checklist: Patient identified, Emergency Drugs available, Suction available and Patient being monitored Patient Re-evaluated:Patient Re-evaluated prior to induction Oxygen Delivery Method: Circle System Utilized Preoxygenation: Pre-oxygenation with 100% oxygen Induction Type: IV induction Ventilation: Mask ventilation without difficulty Laryngoscope Size: Mac and 3 Grade View: Grade II Tube type: Oral Tube size: 7.5 mm Number of attempts: 1 Airway Equipment and Method: Stylet and Oral airway Placement Confirmation: ETT inserted through vocal cords under direct vision,  positive ETCO2 and breath sounds checked- equal and bilateral Secured at: 23 cm Tube secured with: Tape Dental Injury: Teeth and Oropharynx as per pre-operative assessment

## 2017-07-19 NOTE — Progress Notes (Signed)
Mr. Kevin Bradford's temperature is 102.2 degrees F.  Dr. Noreene LarssonJoslin informed and chest x-ray ordered and completed.   Dr. Lajoyce Cornersuda has also been informed and is aware.

## 2017-07-19 NOTE — Progress Notes (Signed)
Orthopedic Tech Progress Note Patient Details:  Kevin Bradford 21-Nov-1945 [id number]  Ortho Devices Type of Ortho Device: Postop shoe/boot Ortho Device/Splint Location: LLE Ortho Device/Splint Interventions: Ordered, Application   Post Interventions Patient Tolerated: Well Instructions Provided: Care of device   Jennye MoccasinHughes, Tomy Khim Craig 07/19/2017, 3:31 PM

## 2017-07-19 NOTE — Progress Notes (Signed)
Patient complaining of dizziness/lightheaded. Given robaxin and oxycodone one hour ago. BP 122/65, HR 79, Temp 97.5, O2 100 room air. Patient placed in trendelenburg position, cold packs applied to neck. Dr. Lajoyce Cornersuda contacted about reaction, [facility]. Will continue to monitor patient.

## 2017-07-19 NOTE — Evaluation (Addendum)
Physical Therapy Evaluation Patient Details Name: Kevin Bradford MRN: [id number] DOB: 02-19-1946 Today's Date: 07/19/2017   History of Present Illness  72yo male s/p left transmetatarsal amputation. PMH CKD, DM, hx DM foot ulcer, neuropathy, GERD, hx L foot 5th toe amputation   Clinical Impression   Patient received in bed, pleasant and willing to participate with PT this afternoon, however anxious about mobility with NWB status and wound vac L foot. He is able to complete bed mobility with general S and assist for management of wound vac and IV lines, however requires Mod assist from elevated surface to maintain NWB status L LE. He was able to briefly ambulate approximately 757f forward and backward today while maintaining NWB L LE however was limited by fatigue. Patient will continue to benefit from skilled PT services to address functional mobility deficits, gait, and safety with activity moving forward. He was left sitting at the edge of bed per his request with RN present and attending, all needs otherwise met.     Follow Up Recommendations Home health PT    Equipment Recommendations  Rolling walker with 5" wheels;3in1 (PT)    Recommendations for Other Services       Precautions / Restrictions Precautions Precautions: Fall;Other (comment) Precaution Comments: wound vac L LE, post-op shoe L foot  Restrictions Weight Bearing Restrictions: Yes LLE Weight Bearing: Non weight bearing      Mobility  Bed Mobility Overal bed mobility: Needs Assistance Bed Mobility: Supine to Sit     Supine to sit: Supervision     General bed mobility comments: S for safety, line management   Transfers Overall transfer level: Needs assistance Equipment used: Rolling walker (2 wheeled) Transfers: Sit to/from Stand Sit to Stand: Mod assist;From elevated surface         General transfer comment: Mod VC for correct form to maintain NWB L LE   Ambulation/Gait Ambulation/Gait assistance: Min  assist Ambulation Distance (Feet): 3 Feet Assistive device: Rolling walker (2 wheeled) Gait Pattern/deviations: Step-to pattern;Decreased weight shift to left;Trunk flexed     General Gait Details: able to perform gait NWB L LE approximately 379fforward, [id number]; limited by fatigue   Stairs            Wheelchair Mobility    Modified Rankin (Stroke Patients Only)       Balance Overall balance assessment: Needs assistance Sitting-balance support: Bilateral upper extremity supported;Feet supported Sitting balance-Leahy Scale: Good     Standing balance support: During functional activity;Bilateral upper extremity supported Standing balance-Leahy Scale: Fair                               Pertinent Vitals/Pain Pain Assessment: 0-10 Pain Score: 1  Pain Location: L foot  Pain Descriptors / Indicators: Aching;Sore Pain Intervention(s): Limited activity within patient's tolerance;Monitored during session    [place] Private residence Living Arrangements: Spouse/significant other Available Help at Discharge: Family;Available 24 hours/day Type of Home: House Home Access: Stairs to enter Entrance Stairs-Rails: Can reach both Entrance Stairs-Number of Steps: 1 Home Layout: Two level;1/2 bath on main level(can live on first level as needed ) Home Equipment: Walker - 2 wheels      Prior Function Level of Independence: Independent               Hand Dominance   Dominant Hand: Right    Extremity/Trunk Assessment   Upper Extremity Assessment Upper  Extremity Assessment: Overall WFL for tasks assessed    Lower Extremity Assessment Lower Extremity Assessment: Generalized weakness    Cervical / Trunk Assessment Cervical / Trunk Assessment: Normal  Communication   Communication: No difficulties  Cognition Arousal/Alertness: Awake/alert Behavior During Therapy: WFL for tasks  assessed/performed Overall Cognitive Status: Within Functional Limits for tasks assessed                                        General Comments General comments (skin integrity, edema, etc.): patient reports continuing to "feel off from medicines" after surgery    Exercises     Assessment/Plan    PT Assessment Patient needs continued PT services  PT Problem List Decreased strength;Decreased mobility;Decreased safety awareness;Decreased coordination;Decreased activity tolerance;Decreased balance       PT Treatment Interventions DME instruction;Therapeutic activities;Gait training;Therapeutic exercise;Patient/family education;Stair training;Balance training;Functional mobility training;Neuromuscular re-education    PT Goals (Current goals can be found in the Care Plan section)  Acute Rehab PT Goals Patient Stated Goal: to go home  PT Goal Formulation: With patient Time For Goal Achievement: 07/26/17 Potential to Achieve Goals: Good    Frequency Min 5X/week   Barriers to discharge        Co-evaluation               AM-PAC PT "6 Clicks" Daily Activity  Outcome Measure Difficulty turning over in bed (including adjusting bedclothes, sheets and blankets)?: Unable Difficulty moving from lying on back to sitting on the side of the bed? : Unable Difficulty sitting down on and standing up from a chair with arms (e.g., wheelchair, bedside commode, etc,.)?: Unable Help needed moving to and from a bed to chair (including a wheelchair)?: A Little Help needed walking in hospital room?: A Little Help needed climbing 3-5 steps with a railing? : A Lot 6 Click Score: 11    End of Session Equipment Utilized During Treatment: Gait belt Activity Tolerance: Patient tolerated treatment well;Patient limited by fatigue Patient left: in bed;with nursing/sitter in room;with call bell/phone within reach;with family/visitor present Nurse Communication: Mobility status PT  Visit Diagnosis: Unsteadiness on feet (R26.81);Muscle weakness (generalized) (M62.81);Other abnormalities of gait and mobility (R26.89);Difficulty in walking, not elsewhere classified (R26.2)    Time: 0539-7673 PT Time Calculation (min) (ACUTE ONLY): 23 min   Charges:   PT Evaluation $PT Eval Low Complexity: 1 Low PT Treatments $Gait Training: 8-22 mins   PT G Codes:   PT G-Codes **NOT FOR INPATIENT CLASS** Functional Assessment Tool Used: AM-PAC 6 Clicks Basic Mobility;Clinical judgement    Deniece Ree PT, DPT, CBIS  Supplemental Physical Therapist [place]

## 2017-07-19 NOTE — Anesthesia Preprocedure Evaluation (Signed)
Anesthesia Evaluation  Patient identified by MRN, date of birth, ID band Patient awake    Reviewed: Allergy & Precautions, NPO status , Patient's Chart, lab work & pertinent test results  Airway Mallampati: III  TM Distance: >3 FB Neck ROM: Full    Dental  (+) Teeth Intact, Dental Advisory Given   Pulmonary    breath sounds clear to auscultation       Cardiovascular  Rhythm:Regular Rate:Normal     Neuro/Psych    GI/Hepatic   Endo/Other  diabetes  Renal/GU      Musculoskeletal   Abdominal   Peds  Hematology   Anesthesia Other Findings   Reproductive/Obstetrics                             Anesthesia Physical Anesthesia Plan  ASA: III  Anesthesia Plan: General   Post-op Pain Management:    Induction:   PONV Risk Score and Plan: Ondansetron and Metaclopromide  Airway Management Planned: LMA  Additional Equipment:   Intra-op Plan:   Post-operative Plan:   Informed Consent: I have reviewed the patients History and Physical, chart, labs and discussed the procedure including the risks, benefits and alternatives for the proposed anesthesia with the patient or authorized representative who has indicated his/her understanding and acceptance.   Dental advisory given  Plan Discussed with: CRNA and Anesthesiologist  Anesthesia Plan Comments:         Anesthesia Quick Evaluation

## 2017-07-19 NOTE — Op Note (Signed)
07/19/2017  1:54 PM  PATIENT:  Kevin Bradford    PRE-OPERATIVE DIAGNOSIS:  Osteomyelitis Left Foot  POST-OPERATIVE DIAGNOSIS:  Same  PROCEDURE:  LEFT TRANSMETATARSAL AMPUTATION, apply wound VAC  SURGEON:  Nadara MustardMarcus V Aladdin Kollmann, MD  PHYSICIAN ASSISTANT:None ANESTHESIA:   General  PREOPERATIVE INDICATIONS:  Kevin Bradford is a  72 y.o. male with a diagnosis of Osteomyelitis Left Foot who failed conservative measures and elected for surgical management.    The risks benefits and alternatives were discussed with the patient preoperatively including but not limited to the risks of infection, bleeding, nerve injury, cardiopulmonary complications, the need for revision surgery, among others, and the patient was willing to proceed.  OPERATIVE IMPLANTS: prevena VAC  OPERATIVE FINDINGS: large abscess, cultures obtained x 2  OPERATIVE PROCEDURE: Patient was brought to the operating room and underwent a general anesthetic.  After adequate levels of anesthesia were obtained patient's left lower extremity was prepped using DuraPrep draped into a sterile field a timeout was called.  A fishmouth incision was made through the midfoot.  A abscess was encountered dorsally.  Cultures were obtained x2.  The skin incision was then cut back proximal to the abscess.  A transmetatarsal amputation was performed with an oscillating saw.  All ulcerative tissue and abscess tissue was resected.  The wound was irrigated with normal saline electrocautery was used for hemostasis.  The incision was closed using 2-0 nylon.  A Praveena wound VAC was applied this had a good suction fit patient was extubated taken the PACU in stable condition  We will continue IV Kefzol in the hospital until patient's organism is identified and oral antibiotics for discharge can be prescribed.   DISCHARGE PLANNING:  Antibiotic duration:pending cultures  Weightbearing: NWB left  Pain medication: as needed  Dressing care/ Wound VAC:VAC x 1  week  Ambulatory devices:walker  Discharge to: home  Follow-up: In the office 1 week post operative.

## 2017-07-20 ENCOUNTER — Encounter (HOSPITAL_COMMUNITY): Payer: Self-pay | Admitting: Orthopedic Surgery

## 2017-07-20 LAB — GLUCOSE, CAPILLARY
GLUCOSE-CAPILLARY: 231 mg/dL — AB (ref 65–99)
GLUCOSE-CAPILLARY: 130 mg/dL — AB (ref 65–99)
Glucose-Capillary: 164 mg/dL — ABNORMAL HIGH (ref 65–99)
Glucose-Capillary: 168 mg/dL — ABNORMAL HIGH (ref 65–99)

## 2017-07-20 NOTE — Progress Notes (Signed)
Patient ID: Kevin Bradford, male   DOB: 02/01/1946, 72 y.o.   MRN: [id number] Patient is postoperative day 1 transmetatarsal amputation.  Patient did have an abscess distal to the surgical incisions.  The Gram stains are positive for gram-positive cocci and gram-negative rods.  Will adjust antibiotics accordingly wound cultures are finalized.  The Praveena wound VAC is functioning well.

## 2017-07-20 NOTE — Progress Notes (Signed)
Physical Therapy Treatment Patient Details Name: Kevin Bradford MRN: [id number] DOB: August 20, 1945 Today's Date: 07/20/2017    History of Present Illness 71yo male s/p left transmetatarsal amputation. PMH CKD, DM, hx DM foot ulcer, neuropathy, GERD, hx L foot 5th toe amputation     PT Comments    Pt is progressing slowly, requires encouragement to participate; pt would like to try knee scooter next PT visit  Follow Up Recommendations  Home health PT;Supervision for mobility/OOB     Equipment Recommendations  Rolling walker with 5" wheels;3in1 (PT);Other (comment)(pt wants knee scooter)    Recommendations for Other Services       Precautions / Restrictions Precautions Precautions: Fall Precaution Comments: wound vac L LE, post-op shoe L foot  Restrictions LLE Weight Bearing: Non weight bearing    Mobility  Bed Mobility Overal bed mobility: Needs Assistance Bed Mobility: Supine to Sit     Supine to sit: Supervision     General bed mobility comments: supervision for safety, VAC line management  Transfers Overall transfer level: Needs assistance Equipment used: Rolling walker (2 wheeled) Transfers: Sit to/from Stand Sit to Stand: Min assist;From elevated surface         General transfer comment: verbal cues for safety, hand placement, st shift to stand and to control descent  Ambulation/Gait Ambulation/Gait assistance: Min assist Ambulation Distance (Feet): 6 Feet Assistive device: Rolling walker (2 wheeled) Gait Pattern/deviations: Trunk flexed     General Gait Details: pt is able to maintain NWB but refuses to amb greater than a few feet fwd and back to chair; he requires verbal cues for safe use of RW and  assist for safety and balance throughout   Stairs            Wheelchair Mobility    Modified Rankin (Stroke Patients Only)       Balance     Sitting balance-Leahy Scale: Good     Standing balance support: During functional  activity;Bilateral upper extremity supported Standing balance-Leahy Scale: Poor(pt is reliant on UEs for balance to maintain NWB )                              Cognition Arousal/Alertness: Awake/alert Behavior During Therapy: Anxious Overall Cognitive Status: Impaired/Different from baseline Area of Impairment: Attention;Following commands;Problem solving;Safety/judgement                   Current Attention Level: Sustained   Following Commands: Follows one step commands consistently;Follows multi-step commands inconsistently Safety/Judgement: Decreased awareness of safety   Problem Solving: Difficulty sequencing;Requires verbal cues General Comments: pt has difficulty focusing on tasks, talks a lot and requires redirection; he appears anxious regarding mobility; he is able to maintain NWB but does odd things like making his wife place a pillow under his Left foot prior to sitting in chair;  he initially refuses but later states he can do anything he has to do at home      Exercises General Exercises - Lower Extremity Ankle Circles/Pumps: AROM;Both;10 reps    General Comments        Pertinent Vitals/Pain Pain Assessment: 0-10 Pain Score: 3  Pain Location: L foot  Pain Descriptors / Indicators: Aching;Sore Pain Intervention(s): Limited activity within patient's tolerance;Monitored during session;RN gave pain meds during session;Repositioned;Other (comment)(elevated LLE)    Home Living                      Prior  Function            PT Goals (current goals can now be found in the care plan section) Acute Rehab PT Goals Patient Stated Goal: to go home  PT Goal Formulation: With patient Time For Goal Achievement: 07/26/17 Potential to Achieve Goals: Good Progress towards PT goals: Progressing toward goals    Frequency    Min 5X/week      PT Plan Current plan remains appropriate    Co-evaluation              AM-PAC PT "6  Clicks" Daily Activity  Outcome Measure  Difficulty turning over in bed (including adjusting bedclothes, sheets and blankets)?: Unable Difficulty moving from lying on back to sitting on the side of the bed? : Unable Difficulty sitting down on and standing up from a chair with arms (e.g., wheelchair, bedside commode, etc,.)?: Unable Help needed moving to and from a bed to chair (including a wheelchair)?: A Little Help needed walking in hospital room?: A Little Help needed climbing 3-5 steps with a railing? : A Lot 6 Click Score: 11    End of Session Equipment Utilized During Treatment: Gait belt Activity Tolerance: Patient tolerated treatment well;Patient limited by fatigue Patient left: in chair;with call bell/phone within reach;with family/visitor present;with nursing/sitter in room   PT Visit Diagnosis: Unsteadiness on feet (R26.81);Muscle weakness (generalized) (M62.81);Other abnormalities of gait and mobility (R26.89);Difficulty in walking, not elsewhere classified (R26.2)     Time: [id number] PT Time Calculation (min) (ACUTE ONLY): 22 min  Charges:  $Gait Training: 8-22 mins                    G Codes:          Kevin Bradford 07/20/2017, 11:43 AM

## 2017-07-20 NOTE — Progress Notes (Signed)
Inpatient Diabetes Program Recommendations  AACE/ADA: New Consensus Statement on Inpatient Glycemic Control (2015)  Target Ranges:  Prepandial:   less than 140 mg/dL      Peak postprandial:   less than 180 mg/dL (1-2 hours)      Critically ill patients:  140 - 180 mg/dL   Results for Kevin Bradford, Kevin Bradford (MRN [id number]) as of 07/20/2017 09:51  Ref. Range 07/19/2017 13:53 07/19/2017 16:21 07/19/2017 19:54 07/20/2017 06:28  Glucose-Capillary Latest Ref Range: 65 - 99 mg/dL 782221 (H) 956176 (H) 213218 (H) 231 (H)   Review of Glycemic Control  Diabetes history: DM 2 Outpatient Diabetes medications: Levemir 40 units, Novolog 8 units tid meal coverage, Metformin 500 mg BID Current orders for Inpatient glycemic control: Levemir 20 units, Novolog Moderate Correction 0-15 units tid, Metformin 500 mg BID, Novolog 4 units tid meal coverage  Inpatient Diabetes Program Recommendations:    Fasting glucose 231 mg/dl this am. Patient takes more Levemir at home. Please consider increasing Levemir to 28 units.  A1c 9.3% on 07/19/17  Thanks,  Christena DeemShannon Matas Burrows RN, MSN, [facility] Inpatient Diabetes Coordinator Team Pager [phone] (8a-5p)

## 2017-07-20 NOTE — Care Management Note (Signed)
Case Management Note  Patient Details  Name: Kevin Bradford MRN: [id number] Date of Birth: [birth year]  Subjective/Objective:   72 yr old gentleman s/p left foot transmetatarsal amputation.   Action/Plan: case manager spoke with patient and his wife concerning discharge plan and DME. Choice for Home Health agency was offered, referral was called to [name] RN, [facility]. Patient's physical address is [address], [place], [place] [id number]. He has rolling walker, plans to borrow a 3in1 and rent a knee scooter.   Expected Discharge Date:   07/20/17               Expected Discharge Plan:  Home w Home Health Services  In-House Referral:  NA  Discharge planning Services  CM Consult  Post Acute Care Choice:  Home Health Choice offered to:  Patient  DME Arranged:  N/A(has rolling walker, borrowing 3in1 and renting a knee scooter) DME Agency:  NA  HH Arranged:  PT HH Agency:  [facility]  Status of Service:  Completed, signed off  If discussed at Long Length of Stay Meetings, dates discussed:    Additional Comments:  [name], [name], RN 07/20/2017, 10:36 AM

## 2017-07-21 LAB — GLUCOSE, CAPILLARY
GLUCOSE-CAPILLARY: 164 mg/dL — AB (ref 65–99)
Glucose-Capillary: 177 mg/dL — ABNORMAL HIGH (ref 65–99)

## 2017-07-21 MED ORDER — SULFAMETHOXAZOLE-TRIMETHOPRIM 800-160 MG PO TABS: 1.0000 | ORAL_TABLET | Freq: Two times a day (BID) | ORAL | 0 refills | Status: DC

## 2017-07-21 MED ORDER — HYDROCODONE-ACETAMINOPHEN 5-325 MG PO TABS: 1.0000 | ORAL_TABLET | ORAL | 0 refills | Status: DC | PRN

## 2017-07-21 MED ORDER — OXYCODONE-ACETAMINOPHEN 5-325 MG PO TABS: 1.0000 | ORAL_TABLET | ORAL | 0 refills | Status: DC | PRN

## 2017-07-21 NOTE — Progress Notes (Signed)
Patient has been provided with discharge instruction and will be given a prescription for discharge.  Demonstration instructions given regarding the wound vac.  Patient will be discharged to home via wheelchair

## 2017-07-21 NOTE — Discharge Summary (Signed)
Discharge Diagnoses:  Active Problems:   S/P transmetatarsal amputation of foot, left (HCC)   Surgeries: Procedure(s): LEFT TRANSMETATARSAL AMPUTATION on 07/19/2017    Consultants:   Discharged Condition: Improved  Hospital Course: Kevin Bradford is an 72 y.o. male who was admitted 07/19/2017 with a chief complaint of abscess osteomyelitis, with a final diagnosis of Osteomyelitis Left Foot.  Patient was brought to the operating room on 07/19/2017 and underwent Procedure(s): LEFT TRANSMETATARSAL AMPUTATION.    Patient was given perioperative antibiotics:  Anti-infectives (From admission, onward)   Start     Dose/Rate Route Frequency Ordered Stop   07/21/17 0000  sulfamethoxazole-trimethoprim (BACTRIM DS,SEPTRA DS) 800-160 MG tablet     1 tablet Oral 2 times daily 07/21/17 0721     07/19/17 2000  ceFAZolin (ANCEF) IVPB 2g/100 mL premix     2 g 200 mL/hr over 30 Minutes Intravenous Every 6 hours 07/19/17 1453 07/22/17 1959   07/19/17 1012  ceFAZolin (ANCEF) IVPB 2g/100 mL premix     2 g 200 mL/hr over 30 Minutes Intravenous On call to O.R. 07/19/17 1012 07/19/17 1322    .  Patient was given sequential compression devices, early ambulation, and aspirin for DVT prophylaxis.  Recent vital signs:  Patient Vitals for the past 24 hrs:  BP Temp Temp src Pulse Resp SpO2  07/21/17 0529 135/70 98.2 F (36.8 C) Oral 72 - 97 %  07/20/17 2039 118/88 98.4 F (36.9 C) Oral 72 - 100 %  07/20/17 1430 125/64 98.1 F (36.7 C) Oral 79 16 100 %  .  Recent laboratory studies: Dg Chest 2 View  Result Date: 07/19/2017 CLINICAL DATA:  72 year old male with a history of foot infection and a fever EXAM: CHEST  2 VIEW COMPARISON:  None. FINDINGS: Cardiomediastinal silhouette within normal limits. Coarse interstitial markings with no confluent airspace disease. No pleural effusion or pneumothorax. No displaced fracture IMPRESSION: Likely chronic lung changes without evidence of superimposed acute  cardiopulmonary disease Electronically Signed   By: [name] D.O.   On: 07/19/2017 11:20    Discharge Medications:   Allergies as of 07/21/2017   No Known Allergies     Medication List    STOP taking these medications   silver sulfADIAZINE 1 % cream Commonly known as:  SILVADENE     TAKE these medications   blood glucose meter kit and supplies Kit Dispense based on patient and insurance preference. Use up to four times daily as directed. (FOR ICD-9 250.00, 250.01).   doxycycline 100 MG tablet Commonly known as:  VIBRA-TABS Take 1 tablet (100 mg total) by mouth 2 (two) times daily.   ferrous sulfate 325 (65 FE) MG tablet Take 1 tablet (325 mg total) by mouth 2 (two) times daily with a meal.   insulin aspart 100 UNIT/ML FlexPen Commonly known as:  NOVOLOG Inject 6 Units into the skin 3 (three) times daily with meals. What changed:    how much to take  when to take this   Insulin Detemir 100 UNIT/ML Pen Commonly known as:  LEVEMIR Inject 40 Units into the skin daily at 10 pm. What changed:  when to take this   Insulin Pen Needle 32G X 4 MM Misc Use with insulin pens to dispense insulin as directed   metFORMIN 500 MG tablet Commonly known as:  GLUCOPHAGE Take 500 mg by mouth 2 (two) times daily with a meal.   naproxen sodium 220 MG tablet Commonly known as:  ALEVE Take 220 mg by mouth  2 (two) times daily as needed (for pain or headache).   nitroGLYCERIN 0.2 mg/hr patch Commonly known as:  NITRODUR - Dosed in mg/24 hr Place 1 patch (0.2 mg total) onto the skin daily.   OMEGA 3 PO Take 1 capsule by mouth every evening.   omeprazole 20 MG tablet Commonly known as:  PRILOSEC OTC Take 20 mg by mouth daily.   oxyCODONE-acetaminophen 5-325 MG tablet Commonly known as:  PERCOCET/ROXICET Take 1 tablet by mouth every 4 (four) hours as needed for severe pain.   sulfamethoxazole-trimethoprim 800-160 MG tablet Commonly known as:  BACTRIM DS,SEPTRA DS Take 1  tablet by mouth 2 (two) times daily.            Discharge Care Instructions  (From admission, onward)        Start     Ordered   07/21/17 0000  Touch down weight bearing    Question Answer Comment  Laterality left   Extremity Lower      07/21/17 0721      Diagnostic Studies: Dg Chest 2 View  Result Date: 07/19/2017 CLINICAL DATA:  72 year old male with a history of foot infection and a fever EXAM: CHEST  2 VIEW COMPARISON:  None. FINDINGS: Cardiomediastinal silhouette within normal limits. Coarse interstitial markings with no confluent airspace disease. No pleural effusion or pneumothorax. No displaced fracture IMPRESSION: Likely chronic lung changes without evidence of superimposed acute cardiopulmonary disease Electronically Signed   By: [name] D.O.   On: 07/19/2017 11:20    Patient benefited maximally from their hospital stay and there were no complications.     Disposition: 01-Home or Self Care Discharge Instructions    Call MD / Call 911   Complete by:  As directed    If you experience chest pain or shortness of breath, CALL 911 and be transported to the hospital emergency room.  If you develope a fever above 101 F, pus (white drainage) or increased drainage or redness at the wound, or calf pain, call your surgeon's office.   Constipation Prevention   Complete by:  As directed    Drink plenty of fluids.  Prune juice may be helpful.  You may use a stool softener, such as Colace (over the counter) 100 mg twice a day.  Use MiraLax (over the counter) for constipation as needed.   Diet - low sodium heart healthy   Complete by:  As directed    Increase activity slowly as tolerated   Complete by:  As directed    Negative Pressure Wound Therapy - Incisional   Complete by:  As directed    Touch down weight bearing   Complete by:  As directed    Laterality:  left   Extremity:  Lower     Follow-up Information    Newt Minion, MD Follow up in 1 week(s).    Specialty:  Orthopedic Surgery Contact information: [place] [address] [phone]        [facility] Follow up.   Specialty:  [place] Why:  A representative from [facility] will contact you to arrange start date and time for your therapy. Contact information: [address] [place] [address] [phone]            Signed: Newt Minion 07/21/2017, 7:21 AM

## 2017-07-21 NOTE — Progress Notes (Signed)
Physical Therapy Treatment Patient Details Name: Kevin Bradford MRN: [id number] DOB: Oct 08, 1945 Today's Date: 07/21/2017    History of Present Illness 72yo male s/p left transmetatarsal amputation. PMH CKD, DM, hx DM foot ulcer, neuropathy, GERD, hx L foot 5th toe amputation     PT Comments    Patient is progressing toward mobility goals. Pt was able to ambulate with RW and use knee scooter with min guard A and min A for functional transfers. Pt too anxious to complete stair training. Current plan remains appropriate.    Follow Up Recommendations  Home health PT;Supervision for mobility/OOB     Equipment Recommendations  Rolling walker with 5" wheels;3in1 (PT);Other (comment)(knee scooter)    Recommendations for Other Services       Precautions / Restrictions Precautions Precautions: Fall Precaution Comments: wound vac L LE, post-op shoe L foot  Restrictions Weight Bearing Restrictions: Yes LLE Weight Bearing: Non weight bearing    Mobility  Bed Mobility Overal bed mobility: Needs Assistance Bed Mobility: Supine to Sit     Supine to sit: Supervision     General bed mobility comments: cues for managing VAC line safely  Transfers Overall transfer level: Needs assistance Equipment used: Rolling walker (2 wheeled) Transfers: Sit to/from Stand Sit to Stand: Min assist;From elevated surface         General transfer comment: cues for safe hand placement and positioning of L LE; assist to power up into standing  Ambulation/Gait Ambulation/Gait assistance: Min guard Ambulation Distance (Feet): 75 Feet Assistive device: Rolling walker (2 wheeled)(knee scooter) Gait Pattern/deviations: Step-to pattern;Trunk flexed     General Gait Details: cues for sequencing and safe use of RW and knee scooter; pt able to maintain NWB status throughout   Stairs         General stair comments: attempted stair training however pt too anxious to practice; technique and sequencing  discussed  Wheelchair Mobility    Modified Rankin (Stroke Patients Only)       Balance Overall balance assessment: Needs assistance Sitting-balance support: Feet supported Sitting balance-Leahy Scale: Good     Standing balance support: During functional activity;Bilateral upper extremity supported Standing balance-Leahy Scale: Poor                              Cognition Arousal/Alertness: Awake/alert Behavior During Therapy: Anxious Overall Cognitive Status: No family/caregiver present to determine baseline cognitive functioning Area of Impairment: Attention;Following commands;Problem solving;Safety/judgement                   Current Attention Level: Sustained   Following Commands: Follows one step commands consistently;Follows multi-step commands inconsistently Safety/Judgement: Decreased awareness of safety   Problem Solving: Difficulty sequencing;Requires verbal cues General Comments: pt has difficulty focusing on tasks, talks a lot and requires redirection; he appears anxious regarding mobility      Exercises      General Comments        Pertinent Vitals/Pain Pain Assessment: Faces Faces Pain Scale: Hurts a little bit Pain Location: L foot  Pain Descriptors / Indicators: Aching Pain Intervention(s): Limited activity within patient's tolerance;Monitored during session;Premedicated before session;Repositioned    Home Living                      Prior Function            PT Goals (current goals can now be found in the care plan section) Acute Rehab PT Goals  PT Goal Formulation: With patient Time For Goal Achievement: 07/26/17 Potential to Achieve Goals: Good Progress towards PT goals: Progressing toward goals    Frequency    Min 5X/week      PT Plan Current plan remains appropriate    Co-evaluation              AM-PAC PT "6 Clicks" Daily Activity  Outcome Measure  Difficulty turning over in bed (including  adjusting bedclothes, sheets and blankets)?: Unable Difficulty moving from lying on back to sitting on the side of the bed? : Unable Difficulty sitting down on and standing up from a chair with arms (e.g., wheelchair, bedside commode, etc,.)?: Unable Help needed moving to and from a bed to chair (including a wheelchair)?: A Little Help needed walking in hospital room?: A Little Help needed climbing 3-5 steps with a railing? : Total 6 Click Score: 10    End of Session Equipment Utilized During Treatment: Gait belt Activity Tolerance: Patient tolerated treatment well Patient left: in chair;with call bell/phone within reach;with nursing/sitter in room Nurse Communication: Mobility status PT Visit Diagnosis: Unsteadiness on feet (R26.81);Muscle weakness (generalized) (M62.81);Other abnormalities of gait and mobility (R26.89);Difficulty in walking, not elsewhere classified (R26.2)     Time: [id number] PT Time Calculation (min) (ACUTE ONLY): 38 min  Charges:  $Gait Training: 23-37 mins $Therapeutic Activity: 8-22 mins                    G Codes:       Erline LevineKellyn Trishia Cuthrell, PTA Pager: [phone]     Carolynne EdouardKellyn R Natoshia Souter 07/21/2017, 2:09 PM

## 2017-07-24 DIAGNOSIS — K219 Gastro-esophageal reflux disease without esophagitis: Secondary | ICD-10-CM | POA: Diagnosis not present

## 2017-07-24 DIAGNOSIS — E1140 Type 2 diabetes mellitus with diabetic neuropathy, unspecified: Secondary | ICD-10-CM | POA: Diagnosis not present

## 2017-07-24 DIAGNOSIS — N183 Chronic kidney disease, stage 3 (moderate): Secondary | ICD-10-CM | POA: Diagnosis not present

## 2017-07-24 DIAGNOSIS — Z4781 Encounter for orthopedic aftercare following surgical amputation: Secondary | ICD-10-CM | POA: Diagnosis not present

## 2017-07-24 DIAGNOSIS — Z794 Long term (current) use of insulin: Secondary | ICD-10-CM | POA: Diagnosis not present

## 2017-07-24 DIAGNOSIS — E1122 Type 2 diabetes mellitus with diabetic chronic kidney disease: Secondary | ICD-10-CM | POA: Diagnosis not present

## 2017-07-24 DIAGNOSIS — Z89422 Acquired absence of other left toe(s): Secondary | ICD-10-CM | POA: Diagnosis not present

## 2017-07-24 LAB — AEROBIC/ANAEROBIC CULTURE (SURGICAL/DEEP WOUND)

## 2017-07-24 LAB — AEROBIC/ANAEROBIC CULTURE W GRAM STAIN (SURGICAL/DEEP WOUND)

## 2017-07-26 ENCOUNTER — Ambulatory Visit (INDEPENDENT_AMBULATORY_CARE_PROVIDER_SITE_OTHER): Payer: Medicare Other | Admitting: Family

## 2017-07-26 ENCOUNTER — Encounter (INDEPENDENT_AMBULATORY_CARE_PROVIDER_SITE_OTHER): Payer: Self-pay | Admitting: Family

## 2017-07-26 VITALS — Ht 74.0 in | Wt 222.0 lb

## 2017-07-26 DIAGNOSIS — Z89432 Acquired absence of left foot: Secondary | ICD-10-CM

## 2017-07-26 NOTE — Progress Notes (Signed)
   Post-Op Visit Note   Patient: Kevin Bradford           Date of Birth: 04/18/1946           MRN: [id number] Visit Date: 07/26/2017 PCP: Georgann HousekeeperHusain, Karrar, MD  Chief Complaint:  Chief Complaint  Patient presents with  . Left Foot - Routine Post Op    07/19/17 left transmet amputation     HPI:  HPI The patient is a 72 year old gentleman seen today 1 week status post left transmetatarsal amputation. Has been nonweight bearing.  Ortho Exam Incision is well approximated with sutures. Some maceration. No drainage. No erythema odor or sign of infection.  Visit Diagnoses: No diagnosis found.  Plan: begin daily dial soap cleansing. Apply dry dressings. nonweight beraing. Follow up in 2 weeks for suture removal.  Follow-Up Instructions: No Follow-up on file.   Imaging: No results found.  Orders:  No orders of the defined types were placed in this encounter.  No orders of the defined types were placed in this encounter.    PMFS History: Patient Active Problem List   Diagnosis Date Noted  . S/P transmetatarsal amputation of foot, left (HCC) 07/19/2017  . Subacute osteomyelitis, left ankle and foot (HCC) 07/17/2017  . Cutaneous abscess of left foot 07/17/2017  . Cellulitis and abscess of leg 10/18/2014  . Diabetic ulcer of left foot associated with type 2 diabetes mellitus (HCC)   . Diabetes mellitus with renal complications (HCC) 10/17/2014  . Diabetic ulcer of left foot (HCC) 10/13/2014  . Diabetes (HCC) 10/13/2014  . Anemia 10/13/2014  . CKD (chronic kidney disease) stage 3, GFR 30-59 ml/min (HCC) 10/13/2014  . Hyperkalemia 10/13/2014  . Skin ulcer (HCC) 10/13/2014   Past Medical History:  Diagnosis Date  . CKD (chronic kidney disease) stage 3, GFR 30-59 ml/min (HCC)    creatinine increases with antibiotics per pt, no known kidney disease per patient  . Diabetes mellitus without complication (HCC)   . Diabetic foot ulcer (HCC)    left  . GERD (gastroesophageal reflux  disease)   . Neuropathy     Family History  Problem Relation Age of Onset  . Dementia Mother     Past Surgical History:  Procedure Laterality Date  . AMPUTATION Left 07/19/2017    LEFT TRANSMETATARSAL AMPUTATION, apply wound VAC  . AMPUTATION Left 07/19/2017   Procedure: LEFT TRANSMETATARSAL AMPUTATION;  Surgeon: Nadara Mustarduda, Marcus V, MD;  [facility];  Service: Orthopedics;  Laterality: Left;  . I&D EXTREMITY Left 10/17/2014   Procedure: IRRIGATION AND DEBRIDEMENT EXTREMITY LEFT FOOT;  Surgeon: Nadara MustardMarcus Duda V, MD;  Location: [facility] OR;  Service: Orthopedics;  Laterality: Left;  . TOE AMPUTATION Right    5th toe   Social History   Occupational History  . Not on file  Tobacco Use  . Smoking status: Never Smoker  . Smokeless tobacco: Never Used  Substance and Sexual Activity  . Alcohol use: Yes    Comment: ocassionaly- maybe 1 beer 2 times a month  . Drug use: No  . Sexual activity: Not on file

## 2017-07-27 DIAGNOSIS — E1122 Type 2 diabetes mellitus with diabetic chronic kidney disease: Secondary | ICD-10-CM | POA: Diagnosis not present

## 2017-07-27 DIAGNOSIS — Z794 Long term (current) use of insulin: Secondary | ICD-10-CM | POA: Diagnosis not present

## 2017-07-27 DIAGNOSIS — Z4781 Encounter for orthopedic aftercare following surgical amputation: Secondary | ICD-10-CM | POA: Diagnosis not present

## 2017-07-27 DIAGNOSIS — E1140 Type 2 diabetes mellitus with diabetic neuropathy, unspecified: Secondary | ICD-10-CM | POA: Diagnosis not present

## 2017-07-27 DIAGNOSIS — K219 Gastro-esophageal reflux disease without esophagitis: Secondary | ICD-10-CM | POA: Diagnosis not present

## 2017-07-27 DIAGNOSIS — N183 Chronic kidney disease, stage 3 (moderate): Secondary | ICD-10-CM | POA: Diagnosis not present

## 2017-08-03 DIAGNOSIS — Z4781 Encounter for orthopedic aftercare following surgical amputation: Secondary | ICD-10-CM | POA: Diagnosis not present

## 2017-08-03 DIAGNOSIS — K219 Gastro-esophageal reflux disease without esophagitis: Secondary | ICD-10-CM | POA: Diagnosis not present

## 2017-08-03 DIAGNOSIS — N183 Chronic kidney disease, stage 3 (moderate): Secondary | ICD-10-CM | POA: Diagnosis not present

## 2017-08-03 DIAGNOSIS — E1122 Type 2 diabetes mellitus with diabetic chronic kidney disease: Secondary | ICD-10-CM | POA: Diagnosis not present

## 2017-08-03 DIAGNOSIS — E1140 Type 2 diabetes mellitus with diabetic neuropathy, unspecified: Secondary | ICD-10-CM | POA: Diagnosis not present

## 2017-08-03 DIAGNOSIS — Z794 Long term (current) use of insulin: Secondary | ICD-10-CM | POA: Diagnosis not present

## 2017-08-09 ENCOUNTER — Encounter (INDEPENDENT_AMBULATORY_CARE_PROVIDER_SITE_OTHER): Payer: Self-pay | Admitting: Family

## 2017-08-09 ENCOUNTER — Ambulatory Visit (INDEPENDENT_AMBULATORY_CARE_PROVIDER_SITE_OTHER): Payer: Medicare Other | Admitting: Family

## 2017-08-09 VITALS — Ht 74.0 in | Wt 222.0 lb

## 2017-08-09 DIAGNOSIS — Z89432 Acquired absence of left foot: Secondary | ICD-10-CM

## 2017-08-09 NOTE — Progress Notes (Signed)
   Post-Op Visit Note   Patient: Kevin Bradford           Date of Birth: August 14, 1945           MRN: [id number] Visit Date: 08/09/2017 PCP: Georgann HousekeeperHusain, Karrar, MD  Chief Complaint:  Chief Complaint  Patient presents with  . Left Foot - Routine Post Op    07/19/17 left transmet amputation     HPI:  HPI The patient is a 72 year old gentleman seen today status post left transmetatarsal amputation. Has been nonweight bearing. In wheel chair today.  Ortho Exam Incision is well approximated with sutures. Healing well. Does have some ischemic changes medially. no maceration. No drainage. No erythema odor or sign of infection.  Visit Diagnoses:  1. S/P transmetatarsal amputation of foot, left (HCC)     Plan: continue daily dial soap cleansing. Apply dry dressings. May begin touch down weight bearing through heel. Follow up in 1 week. Anticipate releasing to full wtb at that time.  Follow-Up Instructions: Return in about 1 week (around 08/16/2017).   Imaging: No results found.  Orders:  No orders of the defined types were placed in this encounter.  No orders of the defined types were placed in this encounter.    PMFS History: Patient Active Problem List   Diagnosis Date Noted  . S/P transmetatarsal amputation of foot, left (HCC) 07/19/2017  . Subacute osteomyelitis, left ankle and foot (HCC) 07/17/2017  . Cutaneous abscess of left foot 07/17/2017  . Cellulitis and abscess of leg 10/18/2014  . Diabetic ulcer of left foot associated with type 2 diabetes mellitus (HCC)   . Diabetes mellitus with renal complications (HCC) 10/17/2014  . Diabetic ulcer of left foot (HCC) 10/13/2014  . Diabetes (HCC) 10/13/2014  . Anemia 10/13/2014  . CKD (chronic kidney disease) stage 3, GFR 30-59 ml/min (HCC) 10/13/2014  . Hyperkalemia 10/13/2014  . Skin ulcer (HCC) 10/13/2014   Past Medical History:  Diagnosis Date  . CKD (chronic kidney disease) stage 3, GFR 30-59 ml/min (HCC)    creatinine  increases with antibiotics per pt, no known kidney disease per patient  . Diabetes mellitus without complication (HCC)   . Diabetic foot ulcer (HCC)    left  . GERD (gastroesophageal reflux disease)   . Neuropathy     Family History  Problem Relation Age of Onset  . Dementia Mother     Past Surgical History:  Procedure Laterality Date  . AMPUTATION Left 07/19/2017    LEFT TRANSMETATARSAL AMPUTATION, apply wound VAC  . AMPUTATION Left 07/19/2017   Procedure: LEFT TRANSMETATARSAL AMPUTATION;  Surgeon: Nadara Mustarduda, Marcus V, MD;  [facility];  Service: Orthopedics;  Laterality: Left;  . I&D EXTREMITY Left 10/17/2014   Procedure: IRRIGATION AND DEBRIDEMENT EXTREMITY LEFT FOOT;  Surgeon: Nadara MustardMarcus Duda V, MD;  Location: [facility] OR;  Service: Orthopedics;  Laterality: Left;  . TOE AMPUTATION Right    5th toe   Social History   Occupational History  . Not on file  Tobacco Use  . Smoking status: Never Smoker  . Smokeless tobacco: Never Used  Substance and Sexual Activity  . Alcohol use: Yes    Comment: ocassionaly- maybe 1 beer 2 times a month  . Drug use: No  . Sexual activity: Not on file

## 2017-08-10 DIAGNOSIS — Z794 Long term (current) use of insulin: Secondary | ICD-10-CM | POA: Diagnosis not present

## 2017-08-10 DIAGNOSIS — N183 Chronic kidney disease, stage 3 (moderate): Secondary | ICD-10-CM | POA: Diagnosis not present

## 2017-08-10 DIAGNOSIS — E1122 Type 2 diabetes mellitus with diabetic chronic kidney disease: Secondary | ICD-10-CM | POA: Diagnosis not present

## 2017-08-10 DIAGNOSIS — E1140 Type 2 diabetes mellitus with diabetic neuropathy, unspecified: Secondary | ICD-10-CM | POA: Diagnosis not present

## 2017-08-10 DIAGNOSIS — K219 Gastro-esophageal reflux disease without esophagitis: Secondary | ICD-10-CM | POA: Diagnosis not present

## 2017-08-10 DIAGNOSIS — Z4781 Encounter for orthopedic aftercare following surgical amputation: Secondary | ICD-10-CM | POA: Diagnosis not present

## 2017-08-11 ENCOUNTER — Encounter (INDEPENDENT_AMBULATORY_CARE_PROVIDER_SITE_OTHER): Payer: Self-pay | Admitting: Orthopedic Surgery

## 2017-08-11 ENCOUNTER — Ambulatory Visit (INDEPENDENT_AMBULATORY_CARE_PROVIDER_SITE_OTHER): Payer: Medicare Other | Admitting: Orthopedic Surgery

## 2017-08-11 ENCOUNTER — Telehealth (INDEPENDENT_AMBULATORY_CARE_PROVIDER_SITE_OTHER): Payer: Self-pay | Admitting: Orthopedic Surgery

## 2017-08-11 ENCOUNTER — Telehealth (INDEPENDENT_AMBULATORY_CARE_PROVIDER_SITE_OTHER): Payer: Self-pay | Admitting: Radiology

## 2017-08-11 VITALS — Ht 74.0 in | Wt 222.0 lb

## 2017-08-11 DIAGNOSIS — T8781 Dehiscence of amputation stump: Secondary | ICD-10-CM

## 2017-08-11 DIAGNOSIS — Z89432 Acquired absence of left foot: Secondary | ICD-10-CM

## 2017-08-11 MED ORDER — NITROGLYCERIN 0.2 MG/HR TD PT24: 0.2000 mg | MEDICATED_PATCH | Freq: Every day | TRANSDERMAL | 12 refills | Status: DC

## 2017-08-11 MED ORDER — DOXYCYCLINE HYCLATE 100 MG PO TABS
100.0000 mg | ORAL_TABLET | Freq: Two times a day (BID) | ORAL | 0 refills | Status: DC
Start: 2017-08-11 — End: 2017-08-17

## 2017-08-11 MED ORDER — PENTOXIFYLLINE ER 400 MG PO TBCR: 400.0000 mg | EXTENDED_RELEASE_TABLET | Freq: Three times a day (TID) | ORAL | 3 refills | Status: DC

## 2017-08-11 NOTE — Telephone Encounter (Signed)
Please call pt he wanted to speak with you

## 2017-08-11 NOTE — Progress Notes (Signed)
Office Visit Note   Patient: Kevin Bradford           Date of Birth: 10-26-1945           MRN: [id number] Visit Date: 08/11/2017              Requested by: Georgann Housekeeper, MD [address] [place], [place] [address] PCP: Georgann Housekeeper, MD  Chief Complaint  Patient presents with  . Left Foot - Wound Check    07/19/17 left transmet amputation       HPI: Patient is a 72 year old gentleman diabetic insensate neuropathy status post left transmetatarsal amputation approximately 3 weeks ago.  Sutures were harvested last week and patient states the wound has progressively shown ischemic changes over the past week.  Patient is currently on oral antibiotics Silvadene dressing changes he is full weightbearing in a Darco shoe.  Assessment & Plan: Visit Diagnoses:  1. S/P transmetatarsal amputation of foot, left (HCC)   2. Dehiscence of amputation stump (HCC)     Plan: Patient will be strict nonweightbearing on the left lower extremity at this time.  He will use his walker or wheelchair or kneeling scooter.  Patient will wash his foot with soap and water daily apply dry dressing.  He will use a nitroglycerin patch every day to help improve the microcirculation he will change the location daily we will also start him on [place] 400 mg 3 times a day with meals and a refill for his doxycycline was provided.  Follow-Up Instructions: Return in about 1 week (around 08/18/2017).   Ortho Exam  Patient is alert, oriented, no adenopathy, well-dressed, normal affect, normal respiratory effort. Examination patient has some dermatitis swelling and ischemic changes over the medial aspect of the surgical incision the wound is approximately 1 mm in width and 1 cm in length with some mild ischemic changes.  There is no wound dehiscence no cellulitis no drainage.  A Doppler was used and patient does have a strong dorsalis pedis biphasic pulse.  Patient's healing seems to be related to his  microcirculation.  His blood sugars are currently under good control as per the patient.  Imaging: No results found. No images are attached to the encounter.  Labs: Lab Results  Component Value Date   HGBA1C 9.3 (H) 07/19/2017   HGBA1C 11.3 (H) 10/14/2014   HGBA1C 10.6 (H) 10/13/2014   ESRSEDRATE 119 (H) 10/18/2014   ESRSEDRATE 115 (H) 10/13/2014   CRP 8.5 (H) 10/18/2014   CRP 21.9 (H) 10/13/2014   LABURIC 3.3 (L) 10/14/2014   REPTSTATUS 07/24/2017 FINAL 07/19/2017   GRAMSTAIN  07/19/2017    FEW WBC PRESENT, PREDOMINANTLY MONONUCLEAR MODERATE GRAM POSITIVE COCCI IN PAIRS MODERATE GRAM NEGATIVE RODS    CULT  07/19/2017    MODERATE ENTEROCOCCUS FAECALIS FEW METHICILLIN RESISTANT STAPHYLOCOCCUS AUREUS MODERATE BACTEROIDES ORALIS BETA LACTAMASE POSITIVE    LABORGA METHICILLIN RESISTANT STAPHYLOCOCCUS AUREUS 07/19/2017   LABORGA ENTEROCOCCUS FAECALIS 07/19/2017    @LABSALLVALUES (HGBA1)@  Body mass index is 28.5 kg/m.  Orders:  No orders of the defined types were placed in this encounter.  No orders of the defined types were placed in this encounter.    Procedures: No procedures performed  Clinical Data: No additional findings.  ROS:  All other systems negative, except as noted in the HPI. Review of Systems  Objective: Vital Signs: Ht 6\' 2"  (1.88 m)   Wt 222 lb (100.7 kg)   BMI 28.50 kg/m   Specialty Comments:  No  specialty comments available.  PMFS History: Patient Active Problem List   Diagnosis Date Noted  . Dehiscence of amputation stump (HCC) 08/11/2017  . S/P transmetatarsal amputation of foot, left (HCC) 07/19/2017  . Subacute osteomyelitis, left ankle and foot (HCC) 07/17/2017  . Cutaneous abscess of left foot 07/17/2017  . Cellulitis and abscess of leg 10/18/2014  . Diabetic ulcer of left foot associated with type 2 diabetes mellitus (HCC)   . Diabetes mellitus with renal complications (HCC) 10/17/2014  . Diabetic ulcer of left foot (HCC)  10/13/2014  . Diabetes (HCC) 10/13/2014  . Anemia 10/13/2014  . CKD (chronic kidney disease) stage 3, GFR 30-59 ml/min (HCC) 10/13/2014  . Hyperkalemia 10/13/2014  . Skin ulcer (HCC) 10/13/2014   Past Medical History:  Diagnosis Date  . CKD (chronic kidney disease) stage 3, GFR 30-59 ml/min (HCC)    creatinine increases with antibiotics per pt, no known kidney disease per patient  . Diabetes mellitus without complication (HCC)   . Diabetic foot ulcer (HCC)    left  . GERD (gastroesophageal reflux disease)   . Neuropathy     Family History  Problem Relation Age of Onset  . Dementia Mother     Past Surgical History:  Procedure Laterality Date  . AMPUTATION Left 07/19/2017    LEFT TRANSMETATARSAL AMPUTATION, apply wound VAC  . AMPUTATION Left 07/19/2017   Procedure: LEFT TRANSMETATARSAL AMPUTATION;  Surgeon: Nadara Mustarduda, Jaydy Fitzhenry V, MD;  [facility];  Service: Orthopedics;  Laterality: Left;  . I&D EXTREMITY Left 10/17/2014   Procedure: IRRIGATION AND DEBRIDEMENT EXTREMITY LEFT FOOT;  Surgeon: Nadara MustardMarcus Casy Tavano V, MD;  Location: [facility] OR;  Service: Orthopedics;  Laterality: Left;  . TOE AMPUTATION Right    5th toe   Social History   Occupational History  . Not on file  Tobacco Use  . Smoking status: Never Smoker  . Smokeless tobacco: Never Used  Substance and Sexual Activity  . Alcohol use: Yes    Comment: ocassionaly- maybe 1 beer 2 times a month  . Drug use: No  . Sexual activity: Not on file

## 2017-08-11 NOTE — Telephone Encounter (Signed)
Patient calling triage this morning, stitches were removed on Wednesday. There is surgical dehiscence. He would like to be seen this morning. Advised to come in and see Dr. Lajoyce Cornersuda at 1030am today.

## 2017-08-14 ENCOUNTER — Ambulatory Visit (INDEPENDENT_AMBULATORY_CARE_PROVIDER_SITE_OTHER): Payer: Medicare Other | Admitting: Orthopedic Surgery

## 2017-08-14 ENCOUNTER — Encounter (INDEPENDENT_AMBULATORY_CARE_PROVIDER_SITE_OTHER): Payer: Self-pay | Admitting: Orthopedic Surgery

## 2017-08-14 VITALS — Ht 74.0 in | Wt 222.0 lb

## 2017-08-14 DIAGNOSIS — L02612 Cutaneous abscess of left foot: Secondary | ICD-10-CM

## 2017-08-14 DIAGNOSIS — Z89432 Acquired absence of left foot: Secondary | ICD-10-CM

## 2017-08-14 DIAGNOSIS — T8781 Dehiscence of amputation stump: Secondary | ICD-10-CM

## 2017-08-14 NOTE — Progress Notes (Signed)
Office Visit Note   Patient: Kevin Bradford           Date of Birth: [birth year]           MRN: [id number] Visit Date: 08/14/2017              Requested by: Georgann HousekeeperHusain, Karrar, MD [address] [place], [place] [address] PCP: Georgann HousekeeperHusain, Karrar, MD  Chief Complaint  Patient presents with  . Left Foot - Wound Check    07/19/17 left transmet amputation       HPI: Patient is a 72 year old gentleman who presents 3 weeks status post left transmetatarsal amputation.  Patient started having some dehiscence medially and laterally he was started on a nitroglycerin patch as well as Trental.  Patient has noticed some increasing wound dehiscence.  Assessment & Plan: Visit Diagnoses:  1. S/P transmetatarsal amputation of foot, left (HCC)   2. Cutaneous abscess of left foot   3. Dehiscence of amputation stump (HCC)     Plan: Due to the exposed bone medially and laterally with full-thickness necrotic wounds at this time I recommended proceeding with revision of the transmetatarsal amputation.  Discussed that patient has approximately 75% chance of the wound healing if this does not heal he would require a transtibial amputation.  Will have him continue with the Trental,  continue with the nitroglycerin patch we will apply a wound VAC postoperatively and follow-up in the office in 1 week.  Follow-Up Instructions: Return in about 2 weeks (around 08/28/2017).   Ortho Exam  Patient is alert, oriented, no adenopathy, well-dressed, normal affect, normal respiratory effort. Examination patient has progressive dehiscence of the wound medially and laterally.  The medial wound is now 2 cm in diameter with exposed first metatarsal.  The lateral wound is 1 cm in diameter with exposed fifth metatarsal.  Remainder of the wound is healed well there is no cellulitis no purulent drainage no abscess no signs of infection.  Patient had a strong dorsalis pedis pulse on the last examination by Doppler.  Biphasic  flow.  Imaging: No results found. No images are attached to the encounter.  Labs: Lab Results  Component Value Date   HGBA1C 9.3 (H) 07/19/2017   HGBA1C 11.3 (H) 10/14/2014   HGBA1C 10.6 (H) 10/13/2014   ESRSEDRATE 119 (H) 10/18/2014   ESRSEDRATE 115 (H) 10/13/2014   CRP 8.5 (H) 10/18/2014   CRP 21.9 (H) 10/13/2014   LABURIC 3.3 (L) 10/14/2014   REPTSTATUS 07/24/2017 FINAL 07/19/2017   GRAMSTAIN  07/19/2017    FEW WBC PRESENT, PREDOMINANTLY MONONUCLEAR MODERATE GRAM POSITIVE COCCI IN PAIRS MODERATE GRAM NEGATIVE RODS    CULT  07/19/2017    MODERATE ENTEROCOCCUS FAECALIS FEW METHICILLIN RESISTANT STAPHYLOCOCCUS AUREUS MODERATE BACTEROIDES ORALIS BETA LACTAMASE POSITIVE    LABORGA METHICILLIN RESISTANT STAPHYLOCOCCUS AUREUS 07/19/2017   LABORGA ENTEROCOCCUS FAECALIS 07/19/2017    @LABSALLVALUES (HGBA1)@  Body mass index is 28.5 kg/m.  Orders:  No orders of the defined types were placed in this encounter.  No orders of the defined types were placed in this encounter.    Procedures: No procedures performed  Clinical Data: No additional findings.  ROS:  All other systems negative, except as noted in the HPI. Review of Systems  Objective: Vital Signs: Ht 6\' 2"  (1.88 m)   Wt 222 lb (100.7 kg)   BMI 28.50 kg/m   Specialty Comments:  No specialty comments available.  PMFS History: Patient Active Problem List   Diagnosis Date Noted  .  Dehiscence of amputation stump (HCC) 08/11/2017  . S/P transmetatarsal amputation of foot, left (HCC) 07/19/2017  . Subacute osteomyelitis, left ankle and foot (HCC) 07/17/2017  . Cutaneous abscess of left foot 07/17/2017  . Cellulitis and abscess of leg 10/18/2014  . Diabetic ulcer of left foot associated with type 2 diabetes mellitus (HCC)   . Diabetes mellitus with renal complications (HCC) 10/17/2014  . Diabetic ulcer of left foot (HCC) 10/13/2014  . Diabetes (HCC) 10/13/2014  . Anemia 10/13/2014  . CKD (chronic  kidney disease) stage 3, GFR 30-59 ml/min (HCC) 10/13/2014  . Hyperkalemia 10/13/2014  . Skin ulcer (HCC) 10/13/2014   Past Medical History:  Diagnosis Date  . CKD (chronic kidney disease) stage 3, GFR 30-59 ml/min (HCC)    creatinine increases with antibiotics per pt, no known kidney disease per patient  . Diabetes mellitus without complication (HCC)   . Diabetic foot ulcer (HCC)    left  . GERD (gastroesophageal reflux disease)   . Neuropathy     Family History  Problem Relation Age of Onset  . Dementia Mother     Past Surgical History:  Procedure Laterality Date  . AMPUTATION Left 07/19/2017    LEFT TRANSMETATARSAL AMPUTATION, apply wound VAC  . AMPUTATION Left 07/19/2017   Procedure: LEFT TRANSMETATARSAL AMPUTATION;  Surgeon: Nadara Mustard, MD;  Location: [facility] OR;  Service: Orthopedics;  Laterality: Left;  . I&D EXTREMITY Left 10/17/2014   Procedure: IRRIGATION AND DEBRIDEMENT EXTREMITY LEFT FOOT;  Surgeon: Nadara Mustard, MD;  Location: [facility] OR;  Service: Orthopedics;  Laterality: Left;  . TOE AMPUTATION Right    5th toe   Social History   Occupational History  . Not on file  Tobacco Use  . Smoking status: Never Smoker  . Smokeless tobacco: Never Used  Substance and Sexual Activity  . Alcohol use: Yes    Comment: ocassionaly- maybe 1 beer 2 times a month  . Drug use: No  . Sexual activity: Not on file

## 2017-08-15 ENCOUNTER — Encounter (HOSPITAL_COMMUNITY): Payer: Self-pay | Admitting: *Deleted

## 2017-08-15 ENCOUNTER — Other Ambulatory Visit (INDEPENDENT_AMBULATORY_CARE_PROVIDER_SITE_OTHER): Payer: Self-pay | Admitting: Orthopedic Surgery

## 2017-08-15 DIAGNOSIS — T8131XD Disruption of external operation (surgical) wound, not elsewhere classified, subsequent encounter: Secondary | ICD-10-CM

## 2017-08-15 NOTE — Progress Notes (Signed)
Denies chest pain or shortness of breath. Requested patient decrease dose of levimir to 20 units. Patient states his blood sugar is never less than 70. Instructed to call for instructions if it was less than 70 in the morning.

## 2017-08-16 ENCOUNTER — Ambulatory Visit (HOSPITAL_COMMUNITY)
Admission: RE | Admit: 2017-08-16 | Discharge: 2017-08-17 | Disposition: A | Discharge status: Home / Self Care | Payer: Medicare Other | Source: Ambulatory Visit | Attending: Orthopedic Surgery | Admitting: Orthopedic Surgery

## 2017-08-16 ENCOUNTER — Ambulatory Visit (HOSPITAL_COMMUNITY): Payer: Medicare Other | Admitting: Anesthesiology

## 2017-08-16 ENCOUNTER — Other Ambulatory Visit: Payer: Self-pay

## 2017-08-16 ENCOUNTER — Ambulatory Visit (INDEPENDENT_AMBULATORY_CARE_PROVIDER_SITE_OTHER): Payer: Medicare Other | Admitting: Family

## 2017-08-16 ENCOUNTER — Encounter (HOSPITAL_COMMUNITY): Payer: Self-pay

## 2017-08-16 ENCOUNTER — Encounter (HOSPITAL_COMMUNITY)
Admission: RE | Disposition: A | Discharge status: Home / Self Care | Payer: Self-pay | Source: Ambulatory Visit | Attending: Orthopedic Surgery

## 2017-08-16 DIAGNOSIS — E1122 Type 2 diabetes mellitus with diabetic chronic kidney disease: Secondary | ICD-10-CM | POA: Diagnosis not present

## 2017-08-16 DIAGNOSIS — X58XXXA Exposure to other specified factors, initial encounter: Secondary | ICD-10-CM | POA: Insufficient documentation

## 2017-08-16 DIAGNOSIS — Z9842 Cataract extraction status, left eye: Secondary | ICD-10-CM | POA: Insufficient documentation

## 2017-08-16 DIAGNOSIS — T8781 Dehiscence of amputation stump: Secondary | ICD-10-CM | POA: Diagnosis not present

## 2017-08-16 DIAGNOSIS — E11621 Type 2 diabetes mellitus with foot ulcer: Secondary | ICD-10-CM | POA: Insufficient documentation

## 2017-08-16 DIAGNOSIS — K219 Gastro-esophageal reflux disease without esophagitis: Secondary | ICD-10-CM | POA: Insufficient documentation

## 2017-08-16 DIAGNOSIS — E1140 Type 2 diabetes mellitus with diabetic neuropathy, unspecified: Secondary | ICD-10-CM | POA: Diagnosis not present

## 2017-08-16 DIAGNOSIS — E1169 Type 2 diabetes mellitus with other specified complication: Secondary | ICD-10-CM | POA: Diagnosis not present

## 2017-08-16 DIAGNOSIS — Z885 Allergy status to narcotic agent status: Secondary | ICD-10-CM | POA: Insufficient documentation

## 2017-08-16 DIAGNOSIS — T8131XD Disruption of external operation (surgical) wound, not elsewhere classified, subsequent encounter: Secondary | ICD-10-CM

## 2017-08-16 DIAGNOSIS — Z9841 Cataract extraction status, right eye: Secondary | ICD-10-CM | POA: Insufficient documentation

## 2017-08-16 DIAGNOSIS — N183 Chronic kidney disease, stage 3 (moderate): Secondary | ICD-10-CM | POA: Insufficient documentation

## 2017-08-16 DIAGNOSIS — Z89432 Acquired absence of left foot: Secondary | ICD-10-CM

## 2017-08-16 DIAGNOSIS — Z820 Family history of epilepsy and other diseases of the nervous system: Secondary | ICD-10-CM | POA: Insufficient documentation

## 2017-08-16 DIAGNOSIS — M86272 Subacute osteomyelitis, left ankle and foot: Secondary | ICD-10-CM | POA: Diagnosis not present

## 2017-08-16 HISTORY — DX: Unspecified osteoarthritis, unspecified site: M19.90

## 2017-08-16 HISTORY — PX: AMPUTATION: SHX166

## 2017-08-16 HISTORY — DX: Type 2 diabetes mellitus with diabetic polyneuropathy: E11.42

## 2017-08-16 HISTORY — PX: STUMP REVISION: SHX6102

## 2017-08-16 HISTORY — DX: Type 2 diabetes mellitus without complications: E11.9

## 2017-08-16 LAB — CBC
HEMATOCRIT: 33.8 % — AB (ref 39.0–52.0)
HEMOGLOBIN: 11.1 g/dL — AB (ref 13.0–17.0)
MCH: 29.5 pg (ref 26.0–34.0)
MCHC: 32.8 g/dL (ref 30.0–36.0)
MCV: 89.9 fL (ref 78.0–100.0)
Platelets: 168 10*3/uL (ref 150–400)
RBC: 3.76 MIL/uL — AB (ref 4.22–5.81)
RDW: 13.6 % (ref 11.5–15.5)
WBC: 4.9 10*3/uL (ref 4.0–10.5)

## 2017-08-16 LAB — COMPREHENSIVE METABOLIC PANEL
ALK PHOS: 76 U/L (ref 38–126)
ALT: 12 U/L — ABNORMAL LOW (ref 17–63)
AST: 18 U/L (ref 15–41)
Albumin: 3.6 g/dL (ref 3.5–5.0)
Anion gap: 10 (ref 5–15)
BILIRUBIN TOTAL: 0.7 mg/dL (ref 0.3–1.2)
BUN: 19 mg/dL (ref 6–20)
CHLORIDE: 106 mmol/L (ref 101–111)
CO2: 21 mmol/L — ABNORMAL LOW (ref 22–32)
CREATININE: 1.56 mg/dL — AB (ref 0.61–1.24)
Calcium: 9.2 mg/dL (ref 8.9–10.3)
GFR calc Af Amer: 50 mL/min — ABNORMAL LOW (ref >60)
GFR, EST NON AFRICAN AMERICAN: 43 mL/min — AB (ref >60)
Glucose, Bld: 118 mg/dL — ABNORMAL HIGH (ref 65–99)
Potassium: 4.6 mmol/L (ref 3.5–5.1)
Sodium: 137 mmol/L (ref 135–145)
TOTAL PROTEIN: 6.9 g/dL (ref 6.5–8.1)

## 2017-08-16 LAB — PROTIME-INR
INR: 1.01
PROTHROMBIN TIME: 13.2 s (ref 11.4–15.2)

## 2017-08-16 LAB — GLUCOSE, CAPILLARY
Glucose-Capillary: 115 mg/dL — ABNORMAL HIGH (ref 65–99)
Glucose-Capillary: 118 mg/dL — ABNORMAL HIGH (ref 65–99)
Glucose-Capillary: 151 mg/dL — ABNORMAL HIGH (ref 65–99)
Glucose-Capillary: 160 mg/dL — ABNORMAL HIGH (ref 65–99)
Glucose-Capillary: 194 mg/dL — ABNORMAL HIGH (ref 65–99)

## 2017-08-16 LAB — APTT: aPTT: 30 seconds (ref 24–36)

## 2017-08-16 SURGERY — REVISION, AMPUTATION SITE
Anesthesia: General | Laterality: Left

## 2017-08-16 MED ORDER — ONDANSETRON HCL 4 MG/2ML IJ SOLN
INTRAMUSCULAR | Status: AC
  Filled 2017-08-16: qty 2

## 2017-08-16 MED ORDER — PROMETHAZINE HCL 25 MG/ML IJ SOLN: 6.2500 mg | INTRAMUSCULAR | Status: DC | PRN

## 2017-08-16 MED ORDER — FENTANYL CITRATE (PF) 250 MCG/5ML IJ SOLN
INTRAMUSCULAR | Status: AC
  Filled 2017-08-16: qty 5

## 2017-08-16 MED ORDER — 0.9 % SODIUM CHLORIDE (POUR BTL) OPTIME
TOPICAL | Status: DC | PRN
  Administered 2017-08-16: 1000 mL

## 2017-08-16 MED ORDER — LIDOCAINE 2% (20 MG/ML) 5 ML SYRINGE
INTRAMUSCULAR | Status: DC | PRN
  Administered 2017-08-16: 80 mg via INTRAVENOUS

## 2017-08-16 MED ORDER — HYDROMORPHONE HCL 1 MG/ML IJ SOLN
INTRAMUSCULAR | Status: AC
  Filled 2017-08-16: qty 1

## 2017-08-16 MED ORDER — HYDROCODONE-ACETAMINOPHEN 5-325 MG PO TABS
ORAL_TABLET | ORAL | Status: AC
  Filled 2017-08-16: qty 1

## 2017-08-16 MED ORDER — MIDAZOLAM HCL 2 MG/2ML IJ SOLN
INTRAMUSCULAR | Status: AC
  Filled 2017-08-16: qty 2

## 2017-08-16 MED ORDER — INSULIN DETEMIR 100 UNIT/ML ~~LOC~~ SOLN
30.0000 [IU] | Freq: Every day | SUBCUTANEOUS | Status: DC
  Administered 2017-08-16: 30 [IU] via SUBCUTANEOUS
  Filled 2017-08-16: qty 0.3

## 2017-08-16 MED ORDER — METOCLOPRAMIDE HCL 5 MG/ML IJ SOLN: 5.0000 mg | Freq: Three times a day (TID) | INTRAMUSCULAR | Status: DC | PRN

## 2017-08-16 MED ORDER — FENTANYL CITRATE (PF) 250 MCG/5ML IJ SOLN
INTRAMUSCULAR | Status: DC | PRN
  Administered 2017-08-16: 50 ug via INTRAVENOUS

## 2017-08-16 MED ORDER — METHOCARBAMOL 500 MG PO TABS
500.0000 mg | ORAL_TABLET | Freq: Four times a day (QID) | ORAL | Status: DC | PRN
  Administered 2017-08-16: 500 mg via ORAL

## 2017-08-16 MED ORDER — METHOCARBAMOL 1000 MG/10ML IJ SOLN
500.0000 mg | Freq: Four times a day (QID) | INTRAMUSCULAR | Status: DC | PRN
  Filled 2017-08-16: qty 5

## 2017-08-16 MED ORDER — ONDANSETRON HCL 4 MG/2ML IJ SOLN
INTRAMUSCULAR | Status: DC | PRN
  Administered 2017-08-16: 4 mg via INTRAVENOUS

## 2017-08-16 MED ORDER — METHOCARBAMOL 500 MG PO TABS
ORAL_TABLET | ORAL | Status: AC
  Filled 2017-08-16: qty 1

## 2017-08-16 MED ORDER — PROPOFOL 10 MG/ML IV BOLUS
INTRAVENOUS | Status: AC
  Filled 2017-08-16: qty 20

## 2017-08-16 MED ORDER — HYDROCODONE-ACETAMINOPHEN 5-325 MG PO TABS
1.0000 | ORAL_TABLET | ORAL | Status: DC | PRN
  Administered 2017-08-16 – 2017-08-17 (×2): 1 via ORAL
  Filled 2017-08-16: qty 1

## 2017-08-16 MED ORDER — SODIUM CHLORIDE 0.9 % IV SOLN
INTRAVENOUS | Status: DC
  Administered 2017-08-16: 11:00:00 via INTRAVENOUS

## 2017-08-16 MED ORDER — DEXAMETHASONE SODIUM PHOSPHATE 10 MG/ML IJ SOLN
INTRAMUSCULAR | Status: AC
  Filled 2017-08-16: qty 1

## 2017-08-16 MED ORDER — CEFAZOLIN SODIUM-DEXTROSE 2-4 GM/100ML-% IV SOLN
2.0000 g | Freq: Four times a day (QID) | INTRAVENOUS | Status: AC
  Administered 2017-08-16 – 2017-08-17 (×3): 2 g via INTRAVENOUS
  Filled 2017-08-16 (×3): qty 100

## 2017-08-16 MED ORDER — ONDANSETRON HCL 4 MG/2ML IJ SOLN: 4.0000 mg | Freq: Four times a day (QID) | INTRAMUSCULAR | Status: DC | PRN

## 2017-08-16 MED ORDER — CHLORHEXIDINE GLUCONATE 4 % EX LIQD: 60.0000 mL | Freq: Once | CUTANEOUS | Status: DC

## 2017-08-16 MED ORDER — CEFAZOLIN SODIUM-DEXTROSE 2-4 GM/100ML-% IV SOLN
2.0000 g | INTRAVENOUS | Status: AC
  Administered 2017-08-16: 2 g via INTRAVENOUS
  Filled 2017-08-16: qty 100

## 2017-08-16 MED ORDER — ONDANSETRON HCL 4 MG PO TABS: 4.0000 mg | ORAL_TABLET | Freq: Four times a day (QID) | ORAL | Status: DC | PRN

## 2017-08-16 MED ORDER — POLYETHYLENE GLYCOL 3350 17 G PO PACK: 17.0000 g | PACK | Freq: Every day | ORAL | Status: DC | PRN

## 2017-08-16 MED ORDER — PENTOXIFYLLINE ER 400 MG PO TBCR
400.0000 mg | EXTENDED_RELEASE_TABLET | Freq: Three times a day (TID) | ORAL | Status: DC
  Administered 2017-08-16 – 2017-08-17 (×3): 400 mg via ORAL
  Filled 2017-08-16 (×5): qty 1

## 2017-08-16 MED ORDER — NITROGLYCERIN 0.2 MG/HR TD PT24
0.2000 mg | MEDICATED_PATCH | Freq: Every day | TRANSDERMAL | Status: DC
  Administered 2017-08-16 – 2017-08-17 (×2): 0.2 mg via TRANSDERMAL
  Filled 2017-08-16 (×2): qty 1

## 2017-08-16 MED ORDER — DEXAMETHASONE SODIUM PHOSPHATE 10 MG/ML IJ SOLN
INTRAMUSCULAR | Status: DC | PRN
  Administered 2017-08-16: 5 mg via INTRAVENOUS

## 2017-08-16 MED ORDER — HYDROMORPHONE HCL 1 MG/ML IJ SOLN
0.2500 mg | INTRAMUSCULAR | Status: DC | PRN
  Administered 2017-08-16 (×2): 0.5 mg via INTRAVENOUS

## 2017-08-16 MED ORDER — ACETAMINOPHEN 325 MG PO TABS: 650.0000 mg | ORAL_TABLET | ORAL | Status: DC | PRN

## 2017-08-16 MED ORDER — INSULIN ASPART 100 UNIT/ML ~~LOC~~ SOLN
4.0000 [IU] | Freq: Three times a day (TID) | SUBCUTANEOUS | Status: DC
  Administered 2017-08-16 – 2017-08-17 (×3): 4 [IU] via SUBCUTANEOUS

## 2017-08-16 MED ORDER — HYDROMORPHONE HCL 1 MG/ML IJ SOLN
1.0000 mg | INTRAMUSCULAR | Status: DC | PRN
  Administered 2017-08-16: 1 mg via INTRAVENOUS

## 2017-08-16 MED ORDER — DOCUSATE SODIUM 100 MG PO CAPS
100.0000 mg | ORAL_CAPSULE | Freq: Two times a day (BID) | ORAL | Status: DC
  Administered 2017-08-17: 100 mg via ORAL
  Filled 2017-08-16 (×2): qty 1

## 2017-08-16 MED ORDER — PROPOFOL 10 MG/ML IV BOLUS
INTRAVENOUS | Status: DC | PRN
  Administered 2017-08-16: 160 mg via INTRAVENOUS

## 2017-08-16 MED ORDER — BISACODYL 10 MG RE SUPP: 10.0000 mg | Freq: Every day | RECTAL | Status: DC | PRN

## 2017-08-16 MED ORDER — SODIUM CHLORIDE 0.9 % IV SOLN
INTRAVENOUS | Status: DC
Start: 2017-08-16 — End: 2017-08-16
  Administered 2017-08-16: 09:00:00 via INTRAVENOUS

## 2017-08-16 MED ORDER — ACETAMINOPHEN 650 MG RE SUPP: 650.0000 mg | RECTAL | Status: DC | PRN

## 2017-08-16 MED ORDER — HYDROCODONE-ACETAMINOPHEN 7.5-325 MG PO TABS
2.0000 | ORAL_TABLET | ORAL | Status: DC | PRN
  Administered 2017-08-16: 2 via ORAL
  Filled 2017-08-16 (×2): qty 2

## 2017-08-16 MED ORDER — LIDOCAINE 2% (20 MG/ML) 5 ML SYRINGE
INTRAMUSCULAR | Status: AC
  Filled 2017-08-16: qty 5

## 2017-08-16 MED ORDER — INSULIN ASPART 100 UNIT/ML ~~LOC~~ SOLN
0.0000 [IU] | Freq: Three times a day (TID) | SUBCUTANEOUS | Status: DC
  Administered 2017-08-16 (×2): 3 [IU] via SUBCUTANEOUS
  Administered 2017-08-17: 2 [IU] via SUBCUTANEOUS

## 2017-08-16 MED ORDER — MAGNESIUM CITRATE PO SOLN: 1.0000 | Freq: Once | ORAL | Status: DC | PRN

## 2017-08-16 MED ORDER — METOCLOPRAMIDE HCL 5 MG PO TABS: 5.0000 mg | ORAL_TABLET | Freq: Three times a day (TID) | ORAL | Status: DC | PRN

## 2017-08-16 MED ORDER — METFORMIN HCL 500 MG PO TABS
500.0000 mg | ORAL_TABLET | Freq: Two times a day (BID) | ORAL | Status: DC
  Administered 2017-08-16 – 2017-08-17 (×2): 500 mg via ORAL
  Filled 2017-08-16 (×2): qty 1

## 2017-08-16 MED ORDER — ASPIRIN EC 325 MG PO TBEC
325.0000 mg | DELAYED_RELEASE_TABLET | Freq: Every day | ORAL | Status: DC
  Administered 2017-08-17: 325 mg via ORAL
  Filled 2017-08-16: qty 1

## 2017-08-16 SURGICAL SUPPLY — 32 items
BLADE SAW RECIP 87.9 MT (BLADE) IMPLANT
BLADE SURG 21 STRL SS (BLADE) IMPLANT
COVER SURGICAL LIGHT HANDLE (MISCELLANEOUS) ×3 IMPLANT
DRAPE EXTREMITY T 121X128X90 (DRAPE) ×3 IMPLANT
DRAPE HALF SHEET 40X57 (DRAPES) ×3 IMPLANT
DRAPE INCISE IOBAN 66X45 STRL (DRAPES) ×3 IMPLANT
DRAPE U-SHAPE 47X51 STRL (DRAPES) ×6 IMPLANT
DRESSING PREVENA PLUS CUSTOM (GAUZE/BANDAGES/DRESSINGS) ×1 IMPLANT
DRSG PREVENA PLUS CUSTOM (GAUZE/BANDAGES/DRESSINGS) ×3
DRSG VAC ATS MED SENSATRAC (GAUZE/BANDAGES/DRESSINGS) ×3 IMPLANT
DURAPREP 26ML APPLICATOR (WOUND CARE) ×3 IMPLANT
ELECT REM PT RETURN 9FT ADLT (ELECTROSURGICAL) ×3
ELECTRODE REM PT RTRN 9FT ADLT (ELECTROSURGICAL) ×1 IMPLANT
GLOVE BIOGEL PI IND STRL 9 (GLOVE) ×1 IMPLANT
GLOVE BIOGEL PI INDICATOR 9 (GLOVE) ×2
GLOVE SURG ORTHO 9.0 STRL STRW (GLOVE) ×3 IMPLANT
GOWN STRL REUS W/ TWL XL LVL3 (GOWN DISPOSABLE) ×2 IMPLANT
GOWN STRL REUS W/TWL XL LVL3 (GOWN DISPOSABLE) ×4
KIT BASIN OR (CUSTOM PROCEDURE TRAY) ×3 IMPLANT
KIT DRSG PREVENA PLUS 7DAY 125 (MISCELLANEOUS) ×3 IMPLANT
KIT PREVENA INCISION MGT 13 (CANNISTER) ×3 IMPLANT
KIT ROOM TURNOVER OR (KITS) ×3 IMPLANT
MANIFOLD NEPTUNE II (INSTRUMENTS) IMPLANT
NS IRRIG 1000ML POUR BTL (IV SOLUTION) ×3 IMPLANT
PACK GENERAL/GYN (CUSTOM PROCEDURE TRAY) ×3 IMPLANT
PAD ARMBOARD 7.5X6 YLW CONV (MISCELLANEOUS) ×3 IMPLANT
PAD NEG PRESSURE SENSATRAC (MISCELLANEOUS) IMPLANT
STAPLER VISISTAT 35W (STAPLE) IMPLANT
SUT ETHILON 2 0 PSLX (SUTURE) ×3 IMPLANT
SUT SILK 2 0 (SUTURE)
SUT SILK 2-0 18XBRD TIE 12 (SUTURE) IMPLANT
TOWEL OR 17X26 10 PK STRL BLUE (TOWEL DISPOSABLE) ×3 IMPLANT

## 2017-08-16 NOTE — Anesthesia Preprocedure Evaluation (Addendum)
Anesthesia Evaluation  Patient identified by MRN, date of birth, ID band Patient awake    Reviewed: Allergy & Precautions, NPO status , Patient's Chart, lab work & pertinent test results  History of Anesthesia Complications Negative for: history of anesthetic complications  Airway Mallampati: III  TM Distance: >3 FB Neck ROM: Full    Dental  (+) Teeth Intact, Dental Advisory Given   Pulmonary neg pulmonary ROS,    breath sounds clear to auscultation       Cardiovascular negative cardio ROS   Rhythm:Regular Rate:Normal     Neuro/Psych negative neurological ROS  negative psych ROS   GI/Hepatic Neg liver ROS, GERD  ,  Endo/Other  diabetes  Renal/GU Renal InsufficiencyRenal disease     Musculoskeletal   Abdominal   Peds  Hematology   Anesthesia Other Findings   Reproductive/Obstetrics                             Anesthesia Physical  Anesthesia Plan  ASA: III  Anesthesia Plan: General   Post-op Pain Management:    Induction:   PONV Risk Score and Plan: 2 and Ondansetron and Dexamethasone  Airway Management Planned: LMA  Additional Equipment:   Intra-op Plan:   Post-operative Plan: Extubation in OR  Informed Consent: I have reviewed the patients History and Physical, chart, labs and discussed the procedure including the risks, benefits and alternatives for the proposed anesthesia with the patient or authorized representative who has indicated his/her understanding and acceptance.   Dental advisory given  Plan Discussed with: CRNA and Anesthesiologist  Anesthesia Plan Comments: (Previous amputation did not have pnb and pain was well controlled.)      Anesthesia Quick Evaluation

## 2017-08-16 NOTE — Evaluation (Signed)
Physical Therapy Evaluation Patient Details Name: Kevin Bradford MRN: [id number] DOB: 10-26-1945 Today's Date: 08/16/2017   History of Present Illness  71yo male s/p left transmetatarsal amputation. PMH CKD, DM, hx DM foot ulcer, neuropathy, GERD, hx L foot 5th toe amputation     Clinical Impression  Patient is s/p above surgery resulting in functional limitations due to the deficits listed below (see PT Problem List). PTA, pt living at home with wife receiving HHPT for first amputation. Pt lives in 1 story home with small stair to enter and will have 24 hour supervision. Upon eval, patient limited by lethargy and drowsiness post op, as well as some pain and weakness that limits his mobility. Unable to progress OOB this session, however currently independent with bed mobility and tolerating sitting EOB well. Agreed to meet with patient again later this afternoon to see if he is feeling better, anticipate he will do great with therapy. Pt and wife report no concerns for safe return home and feel comfortable with NWB status. Will progress this evening if tolerated.  Patient will benefit from skilled PT to increase their independence and safety with mobility to allow discharge to the venue listed below.       Follow Up Recommendations Home health PT;Supervision for mobility/OOB    Equipment Recommendations  None recommended by PT    Recommendations for Other Services       Precautions / Restrictions Restrictions Weight Bearing Restrictions: Yes LLE Weight Bearing: Non weight bearing      Mobility  Bed Mobility Overal bed mobility: Independent             General bed mobility comments: Patient supine<>sit without assistance. Once EOB patient reported feeling weak and drowsy and requested to lie back down.   Transfers                 General transfer comment: unable this session  Ambulation/Gait             General Gait Details: unable this session.  Stairs            Wheelchair Mobility    Modified Rankin (Stroke Patients Only)       Balance Overall balance assessment: Needs assistance Sitting-balance support: Feet unsupported;No upper extremity supported Sitting balance-Leahy Scale: Good                                       Pertinent Vitals/Pain Pain Assessment: Faces Faces Pain Scale: Hurts little more Pain Location: L foot Pain Descriptors / Indicators: Discomfort;Grimacing Pain Intervention(s): Limited activity within patient's tolerance;Monitored during session    Home Living Family/patient expects to be discharged to:: Private residence Living Arrangements: Spouse/significant other Available Help at Discharge: Family;Available 24 hours/day Type of Home: House Home Access: Stairs to enter Entrance Stairs-Rails: Can reach both Entrance Stairs-Number of Steps: 1 Home Layout: Two level;1/2 bath on main level Home Equipment: Walker - 2 wheels      Prior Function Level of Independence: Independent               Hand Dominance   Dominant Hand: Right    Extremity/Trunk Assessment   Upper Extremity Assessment Upper Extremity Assessment: Overall WFL for tasks assessed    Lower Extremity Assessment Lower Extremity Assessment: Overall WFL for tasks assessed(Limited ankle dorsiflexion due to guarding)    Cervical / Trunk Assessment Cervical / Trunk Assessment: Normal  Communication  Communication: No difficulties  Cognition Arousal/Alertness: Awake/alert;Lethargic Behavior During Therapy: WFL for tasks assessed/performed Overall Cognitive Status: Within Functional Limits for tasks assessed                                 General Comments: Patient reports feeling drowsier and lethargic than baseline. (post op).      General Comments      Exercises     Assessment/Plan    PT Assessment Patient needs continued PT services  PT Problem List Decreased strength;Decreased  activity tolerance;Decreased balance       PT Treatment Interventions DME instruction;Gait training;Stair training;Functional mobility training;Therapeutic activities;Therapeutic exercise;Balance training    PT Goals (Current goals can be found in the Care Plan section)  Acute Rehab PT Goals Patient Stated Goal: return home and continue HHPT  PT Goal Formulation: With patient Time For Goal Achievement: 08/23/17 Potential to Achieve Goals: Good    Frequency Min 5X/week   Barriers to discharge        Co-evaluation               AM-PAC PT "6 Clicks" Daily Activity  Outcome Measure Difficulty turning over in bed (including adjusting bedclothes, sheets and blankets)?: None Difficulty moving from lying on back to sitting on the side of the bed? : None Difficulty sitting down on and standing up from a chair with arms (e.g., wheelchair, bedside commode, etc,.)?: None Help needed moving to and from a bed to chair (including a wheelchair)?: A Little Help needed walking in hospital room?: A Little Help needed climbing 3-5 steps with a railing? : A Little 6 Click Score: 21    End of Session   Activity Tolerance: Patient limited by lethargy Patient left: in bed;with call bell/phone within reach;with family/visitor present Nurse Communication: Mobility status PT Visit Diagnosis: Unsteadiness on feet (R26.81);Other abnormalities of gait and mobility (R26.89);Pain Pain - Right/Left: Left Pain - part of body: Ankle and joints of foot    Time: [id number] PT Time Calculation (min) (ACUTE ONLY): 19 min   Charges:   PT Evaluation $PT Eval Low Complexity: 1 Low     PT G Codes:        Etta GrandchildSean Westyn Keatley, PT, DPT Acute Rehab Services Pager: [phone]    Etta GrandchildSean  Monserath Neff 08/16/2017, 1:53 PM

## 2017-08-16 NOTE — Op Note (Signed)
08/16/2017  10:33 AM  PATIENT:  Kevin Bradford    PRE-OPERATIVE DIAGNOSIS:  Dehiscence Left Transmetatarsal Amputation  POST-OPERATIVE DIAGNOSIS:  Same  PROCEDURE:  REVISION LEFT TRANSMETATARSAL AMPUTATION  SURGEON:  Nadara MustardMarcus V Duda, MD  PHYSICIAN ASSISTANT:None ANESTHESIA:   General  PREOPERATIVE INDICATIONS:  Kevin Bradford is a  72 y.o. male with a diagnosis of Dehiscence Left Transmetatarsal Amputation who failed conservative measures and elected for surgical management.    The risks benefits and alternatives were discussed with the patient preoperatively including but not limited to the risks of infection, bleeding, nerve injury, cardiopulmonary complications, the need for revision surgery, among others, and the patient was willing to proceed.  OPERATIVE IMPLANTS: Praveena wound VAC.  OPERATIVE FINDINGS: Petechial bleeding along the wound edges.  No abscess.  OPERATIVE PROCEDURE: Patient was brought the operating room and underwent a general anesthetic.  After adequate levels of anesthesia were obtained patient's left lower extremity was prepped using DuraPrep draped into a sterile field a timeout was called.  A fishmouth incision was made 1 cm proximal to the dehisced wound.  A reciprocating saw was used to perform a revision of the transmetatarsal amputation.  Electrocautery was used for hemostasis.  The wound was irrigated with normal saline.  Incision was closed using 2-0 nylon.  A Praveena wound VAC was applied this had a good suction fit.  Patient was extubated taken the PACU in stable condition.   DISCHARGE PLANNING:  Antibiotic duration: 24 hours postoperatively.  Weightbearing: Nonweightbearing on the left.  Pain medication: Patient will need prescription for Vicodin for discharge.  Dressing care/ Wound VAC: Praveena wound VAC with good suction fit.  Ambulatory devices: Walker and wheelchair.  Discharge to: Home after 23-hour observation.  Follow-up: In the office 1  week post operative.

## 2017-08-16 NOTE — H&P (Signed)
Kevin Bradford is an 72 y.o. male.   Chief Complaint: Dehiscence with exposed bone left transmetatarsal amputation. HPI: Patient is a 72 year old gentleman diabetic insensate neuropathy status post limb salvage intervention for transmetatarsal amputation.  Patient has had dehiscence of the wound medially over the first ray.  Patient has been started on trying to tell and nitroglycerin patch.  Past Medical History:  Diagnosis Date  . CKD (chronic kidney disease) stage 3, GFR 30-59 ml/min (HCC)    creatinine increases with antibiotics per pt, no known kidney disease per patient  . Diabetes mellitus without complication (HCC)   . Diabetic foot ulcer (HCC)    left  . GERD (gastroesophageal reflux disease)   . Neuropathy     Past Surgical History:  Procedure Laterality Date  . AMPUTATION Left 07/19/2017    LEFT TRANSMETATARSAL AMPUTATION, apply wound VAC  . AMPUTATION Left 07/19/2017   Procedure: LEFT TRANSMETATARSAL AMPUTATION;  Surgeon: Nadara Mustarduda, Marcus V, MD;  [facility];  Service: Orthopedics;  Laterality: Left;  . CATARACT EXTRACTION, BILATERAL     ~ 1.5 years  . I&D EXTREMITY Left 10/17/2014   Procedure: IRRIGATION AND DEBRIDEMENT EXTREMITY LEFT FOOT;  Surgeon: Nadara MustardMarcus Duda V, MD;  Location: [facility] OR;  Service: Orthopedics;  Laterality: Left;  . TOE AMPUTATION Right    5th toe    Family History  Problem Relation Age of Onset  . Dementia Mother    Social History:  reports that  has never smoked. he has never used smokeless tobacco. He reports that he drinks alcohol. He reports that he does not use drugs.  Allergies:  Allergies  Allergen Reactions  . Oxycodone Shortness Of Breath    No medications prior to admission.    No results found for this or any previous visit (from the past 48 hour(s)). No results found.  Review of Systems  All other systems reviewed and are negative.   There were no vitals taken for this visit. Physical Exam  Examination patient is alert oriented  no adenopathy well-dressed normal affect normal respiratory effort he has an antalgic gait.  Examination there is a necrotic ulcer 1 cm diameter over the medial first ray left foot.  Has a strong biphasic dorsalis pedis pulse.  He does not have protective sensation.  There is no ascending cellulitis no abscess. Assessment/Plan Assessment: Diabetic insensate neuropathy with dehiscence and exposed bone left foot status post transmetatarsal amputation.  Plan: We will plan for revision of the transmetatarsal amputation risks and benefits were discussed including risk of the wound not healing need for a trans-tibial amputation.  Patient states he understands wished to proceed at this time.  Nadara MustardMarcus V Duda, MD 08/16/2017, 7:05 AM

## 2017-08-16 NOTE — Anesthesia Postprocedure Evaluation (Signed)
Anesthesia Post Note  Patient: Kevin OlpGeorge Bradford  Procedure(s) Performed: REVISION LEFT TRANSMETATARSAL AMPUTATION (Left )     Patient location during evaluation: PACU Anesthesia Type: General Level of consciousness: awake and alert Pain management: pain level controlled Vital Signs Assessment: post-procedure vital signs reviewed and stable Respiratory status: spontaneous breathing, nonlabored ventilation, respiratory function stable and patient connected to nasal cannula oxygen Cardiovascular status: blood pressure returned to baseline and stable Postop Assessment: no apparent nausea or vomiting Anesthetic complications: no    Last Vitals:  Vitals:   08/16/17 1115 08/16/17 1142  BP: 130/69 123/75  Pulse: 61 (!) 52  Resp: 17 14  Temp: 36.7 C 36.7 C  SpO2: 95% 92%    Last Pain:  Vitals:   08/16/17 1816  TempSrc:   PainSc: 3                  Gregg Holster EDWARD

## 2017-08-16 NOTE — Anesthesia Procedure Notes (Signed)
Procedure Name: LMA Insertion Date/Time: 08/16/2017 10:07 AM Performed by: Army FossaPulliam, Lonell Stamos Dane, CRNA Pre-anesthesia Checklist: Patient identified, Emergency Drugs available, Suction available and Patient being monitored Patient Re-evaluated:Patient Re-evaluated prior to induction Oxygen Delivery Method: Circle System Utilized Preoxygenation: Pre-oxygenation with 100% oxygen Induction Type: IV induction Ventilation: Mask ventilation without difficulty LMA: LMA inserted LMA Size: 5.0 Number of attempts: 1 Airway Equipment and Method: Bite block Placement Confirmation: positive ETCO2 Tube secured with: Tape Dental Injury: Teeth and Oropharynx as per pre-operative assessment

## 2017-08-16 NOTE — Progress Notes (Signed)
Physical Therapy Treatment Patient Details Name: Kevin OlpGeorge Bradford MRN: [id number] DOB: 1946-07-18 Today's Date: 08/16/2017    History of Present Illness 71yo male s/p left transmetatarsal amputation. PMH CKD, DM, hx DM foot ulcer, neuropathy, GERD, hx L foot 5th toe amputation     PT Comments    Patient feeling better this evening. Session focused on gait training OOB into hallway. Pt supervision level with all mobility and reports no concern for return to home. Discussed knee scooter however patient prefers to ambulate in RW. Pt will do well with HHPT.      Follow Up Recommendations  Home health PT;Supervision for mobility/OOB     Equipment Recommendations  None recommended by PT    Recommendations for Other Services       Precautions / Restrictions Restrictions Weight Bearing Restrictions: Yes LLE Weight Bearing: Non weight bearing    Mobility  Bed Mobility Overal bed mobility: Independent             General bed mobility comments: Patient supine<>sit without   Transfers Overall transfer level: Needs assistance Equipment used: Rolling walker (2 wheeled) Transfers: Sit to/from Stand Sit to Stand: Min guard         General transfer comment: min guard for safety, cues for hand placement on RW. eleavted surface slightly.  Ambulation/Gait Ambulation/Gait assistance: Min guard;Supervision Ambulation Distance (Feet): 80 Feet Assistive device: Rolling walker (2 wheeled) Gait Pattern/deviations: Step-to pattern Gait velocity: decreased   General Gait Details: hop to gait on RLE, adhered to NWB status well with no LOB. progresed to supervision.    Stairs            Wheelchair Mobility    Modified Rankin (Stroke Patients Only)       Balance Overall balance assessment: Needs assistance Sitting-balance support: Feet unsupported;No upper extremity supported Sitting balance-Leahy Scale: Good     Standing balance support: During functional  activity;Bilateral upper extremity supported Standing balance-Leahy Scale: Fair Standing balance comment: RW for support                            Cognition Arousal/Alertness: Awake/alert Behavior During Therapy: WFL for tasks assessed/performed Overall Cognitive Status: Within Functional Limits for tasks assessed                                 General Comments: Pt no longer drowsy      Exercises      General Comments        Pertinent Vitals/Pain Pain Assessment: Faces Faces Pain Scale: Hurts little more Pain Location: L foot Pain Descriptors / Indicators: Discomfort;Grimacing Pain Intervention(s): Limited activity within patient's tolerance;Monitored during session    Home Living                      Prior Function            PT Goals (current goals can now be found in the care plan section) Acute Rehab PT Goals Patient Stated Goal: return home and continue HHPT  PT Goal Formulation: With patient Time For Goal Achievement: 08/23/17 Potential to Achieve Goals: Good    Frequency    Min 5X/week      PT Plan Current plan remains appropriate    Co-evaluation              AM-PAC PT "6 Clicks" Daily Activity  Outcome Measure  Difficulty turning over in bed (including adjusting bedclothes, sheets and blankets)?: None Difficulty moving from lying on back to sitting on the side of the bed? : None Difficulty sitting down on and standing up from a chair with arms (e.g., wheelchair, bedside commode, etc,.)?: None Help needed moving to and from a bed to chair (including a wheelchair)?: A Little Help needed walking in hospital room?: A Little Help needed climbing 3-5 steps with a railing? : A Little 6 Click Score: 21    End of Session Equipment Utilized During Treatment: Gait belt Activity Tolerance: Patient tolerated treatment well Patient left: in bed;with call bell/phone within reach Nurse Communication: Mobility  status PT Visit Diagnosis: Unsteadiness on feet (R26.81);Other abnormalities of gait and mobility (R26.89);Pain Pain - Right/Left: Left Pain - part of body: Ankle and joints of foot     Time: 1610-9604 PT Time Calculation (min) (ACUTE ONLY): 22 min  Charges:  $Gait Training: 8-22 mins                    G Codes:      Etta Grandchild, PT, DPT Acute Rehab Services Pager: [phone]     Etta Grandchild 08/16/2017, 6:33 PM

## 2017-08-16 NOTE — Transfer of Care (Signed)
Immediate Anesthesia Transfer of Care Note  Patient: Kevin Bradford  Procedure(s) Performed: REVISION LEFT TRANSMETATARSAL AMPUTATION (Left )  Patient Location: PACU  Anesthesia Type:General  Level of Consciousness: drowsy and patient cooperative  Airway & Oxygen Therapy: Patient Spontanous Breathing and Patient connected to face mask oxygen  Post-op Assessment: Report given to RN and Post -op Vital signs reviewed and stable  Post vital signs: Reviewed and stable  Last Vitals:  Vitals:   08/16/17 0818 08/16/17 1029  BP: 133/66 129/72  Pulse: 74 68  Resp: 20 (!) 8  Temp: 36.7 C 36.6 C  SpO2: 98% 100%    Last Pain:  Vitals:   08/16/17 1029  TempSrc:   PainSc: (P) 0-No pain      Patients Stated Pain Goal: 3 (08/16/17 0818)  Complications: No apparent anesthesia complications

## 2017-08-17 ENCOUNTER — Telehealth (INDEPENDENT_AMBULATORY_CARE_PROVIDER_SITE_OTHER): Payer: Self-pay | Admitting: Family

## 2017-08-17 ENCOUNTER — Other Ambulatory Visit (INDEPENDENT_AMBULATORY_CARE_PROVIDER_SITE_OTHER): Payer: Self-pay | Admitting: Family

## 2017-08-17 ENCOUNTER — Encounter (HOSPITAL_COMMUNITY): Payer: Self-pay | Admitting: Orthopedic Surgery

## 2017-08-17 DIAGNOSIS — E1122 Type 2 diabetes mellitus with diabetic chronic kidney disease: Secondary | ICD-10-CM | POA: Diagnosis not present

## 2017-08-17 DIAGNOSIS — E1140 Type 2 diabetes mellitus with diabetic neuropathy, unspecified: Secondary | ICD-10-CM | POA: Diagnosis not present

## 2017-08-17 DIAGNOSIS — T8781 Dehiscence of amputation stump: Secondary | ICD-10-CM

## 2017-08-17 DIAGNOSIS — N183 Chronic kidney disease, stage 3 (moderate): Secondary | ICD-10-CM | POA: Diagnosis not present

## 2017-08-17 DIAGNOSIS — Z89432 Acquired absence of left foot: Secondary | ICD-10-CM

## 2017-08-17 DIAGNOSIS — E11621 Type 2 diabetes mellitus with foot ulcer: Secondary | ICD-10-CM | POA: Diagnosis not present

## 2017-08-17 DIAGNOSIS — K219 Gastro-esophageal reflux disease without esophagitis: Secondary | ICD-10-CM | POA: Diagnosis not present

## 2017-08-17 LAB — GLUCOSE, CAPILLARY: GLUCOSE-CAPILLARY: 124 mg/dL — AB (ref 65–99)

## 2017-08-17 LAB — MRSA PCR SCREENING: MRSA by PCR: NEGATIVE

## 2017-08-17 MED ORDER — HYDROCODONE-ACETAMINOPHEN 5-325 MG PO TABS: 1.0000 | ORAL_TABLET | ORAL | 0 refills | Status: DC | PRN

## 2017-08-17 NOTE — Plan of Care (Signed)
  Adequate for Discharge Education: Knowledge of General Education information will improve 08/17/2017 0959 - Adequate for Discharge by Darreld Mcleanox, Adaijah Endres, RN Health Behavior/Discharge Planning: Ability to manage health-related needs will improve 08/17/2017 0959 - Adequate for Discharge by Darreld Mcleanox, Yulia Ulrich, RN Clinical Measurements: Ability to maintain clinical measurements within normal limits will improve 08/17/2017 0959 - Adequate for Discharge by Darreld Mcleanox, Kewon Statler, RN Will remain free from infection 08/17/2017 [id number] - Adequate for Discharge by Darreld Mcleanox, Liylah Najarro, RN Diagnostic test results will improve 08/17/2017 0959 - Adequate for Discharge by Darreld Mcleanox, Jontay Maston, RN Respiratory complications will improve 08/17/2017 0959 - Adequate for Discharge by Darreld Mcleanox, Valeria Boza, RN Cardiovascular complication will be avoided 08/17/2017 0959 - Adequate for Discharge by Darreld Mcleanox, Bryton Romagnoli, RN Activity: Risk for activity intolerance will decrease 08/17/2017 0959 - Adequate for Discharge by Darreld Mcleanox, Julita Ozbun, RN Nutrition: Adequate nutrition will be maintained 08/17/2017 0959 - Adequate for Discharge by Darreld Mcleanox, Oralia Criger, RN Coping: Level of anxiety will decrease 08/17/2017 0959 - Adequate for Discharge by Darreld Mcleanox, Ameet Sandy, RN Elimination: Will not experience complications related to bowel motility 08/17/2017 0959 - Adequate for Discharge by Darreld Mcleanox, Rihanna Marseille, RN Will not experience complications related to urinary retention 08/17/2017 0959 - Adequate for Discharge by Darreld Mcleanox, Paije Goodhart, RN Pain Managment: General experience of comfort will improve 08/17/2017 0959 - Adequate for Discharge by Darreld Mcleanox, Genavive Kubicki, RN Safety: Ability to remain free from injury will improve 08/17/2017 0959 - Adequate for Discharge by Darreld Mcleanox, Rosamund Nyland, RN Skin Integrity: Risk for impaired skin integrity will decrease 08/17/2017 0959 - Adequate for Discharge by Darreld Mcleanox, Kamarian Sahakian, RN

## 2017-08-17 NOTE — Care Management Note (Signed)
Case Management Note  Patient Details  Name: Kevin Bradford MRN: [id number] Date of Birth: [birth year]  Subjective/Objective:   72 yr old male s/p revision of left mid foot amputation.                  Action/Plan: ase manager spoke with patient concerning discharge plan. He will resume Home Health with [facility], no changes. Case manager contacted [name], [facility] Liaison to inform of patient's discharge. He has family support.   Expected Discharge Date:  08/17/17               Expected Discharge Plan:  Home w Home Health Services  In-House Referral:     Discharge planning Services     Post Acute Care Choice:  Resumption of Svcs/PTA Provider, Home Health Choice offered to:  Patient  DME Arranged:  (has RW and 3in1) DME Agency:  NA  HH Arranged:  PT HH Agency:  [facility]  Status of Service:  Completed, signed off  If discussed at Long Length of Stay Meetings, dates discussed:    Additional Comments:  [name], Abass Misener Naomi, RN 08/17/2017, 3:47 PM

## 2017-08-17 NOTE — Telephone Encounter (Signed)
Patient's wife called stating that the patient had a brief stumble at the hospital this morning and had to put pressure on his foot and there was some bleeding, but by the time they left the hospital, it was running clear into the vacuum.  CB#[phone].  Thank you.

## 2017-08-17 NOTE — Telephone Encounter (Signed)
Patient calling triage, he states he was discharged, went to use the bathroom, had a stumble due to 'uneven floors in bathroom at hospital' had to put some weight on foot he just had surgery on. There is bleeding, this has lessen. Advised he may still have some bleeding tonight. If he has concerns over the night to call the on call doctor, but this should continue to lessen. Monitor and follow up tomorrow if he continues to have problems.

## 2017-08-17 NOTE — Progress Notes (Signed)
PT Cancellation Note  Patient Details Name: Kevin Bradford MRN: [id number] DOB: 06/04/1946   Cancelled Treatment:     Pt refused therapy this session. Reports he is ready to return home today, and does not need to practice stairs as he wont be using them for a few weeks and will have HHPT prepare him.   Etta GrandchildSean Asenath Balash, PT, DPT Acute Rehab Services Pager: [phone]     Etta GrandchildSean  Oveda Dadamo 08/17/2017, 8:26 AM

## 2017-08-18 ENCOUNTER — Encounter (INDEPENDENT_AMBULATORY_CARE_PROVIDER_SITE_OTHER): Payer: Self-pay | Admitting: Physician Assistant

## 2017-08-18 ENCOUNTER — Ambulatory Visit (INDEPENDENT_AMBULATORY_CARE_PROVIDER_SITE_OTHER): Payer: Medicare Other | Admitting: Physician Assistant

## 2017-08-18 DIAGNOSIS — Z89432 Acquired absence of left foot: Secondary | ICD-10-CM

## 2017-08-18 NOTE — Progress Notes (Signed)
Subjective: 2 Days Post-Op Procedure(s) (LRB): REVISION LEFT TRANSMETATARSAL AMPUTATION (Left)  Patient sitting up in bed. Many concerns voiced about incisional healing and MRSA. Discussed all.     Objective:   Body mass index is 28.5 kg/m.  VITALS:      Physical Exam  Constitutional: He is oriented to person, place, and time and well-developed, well-nourished, and in no distress.  Pulmonary/Chest: Effort normal.  Musculoskeletal: He exhibits tenderness. He exhibits no edema.  Left transmetatarsal amputation, wound vac in place with good seal to fit. No leaking or erythema.  Neurological: He is alert and oriented to person, place, and time.  Skin: Skin is warm and dry. No erythema.  Psychiatric: Mood and affect normal.  Nursing note reviewed.    LABS Recent Labs    08/16/17 0758  HGB 11.1*  WBC 4.9  PLT 168   Recent Labs    08/16/17 0758  NA 137  K 4.6  CL 106  CO2 21*  BUN 19  CREATININE 1.56*  GLUCOSE 118*   Recent Labs    08/16/17 0758  INR 1.01     Assessment/Plan: Discharge plan discussed with patient. Non weight bearing on operative extremity.   Up with therapy Discharge home with home health today  Author: Barnie Delrin Dandrea Widdowson, FNP 08/18/2017 1:43 PM

## 2017-08-18 NOTE — Progress Notes (Signed)
Post-Op Visit Note   Patient: Kevin Bradford           Date of Birth: 1945/10/03           MRN: [id number] Visit Date: 08/18/2017 PCP: Georgann Housekeeper, MD   Assessment & Plan:  Chief Complaint:  Chief Complaint  Patient presents with  . Left Foot - Pain, Follow-up   Visit Diagnoses:  1. S/P transmetatarsal amputation of foot, left (HCC)     Plan: Impression is status post revision transmetatarsal amputation of the left foot.  Mikai is a pleasant 72 year old gentleman who comes in today with concerns about his left foot.  He is status post revision left transmetatarsal amputation 08/16/2017.  He was doing well and was discharged yesterday when he slipped and fell in the bathroom.  He noticed increased blood to the wound VAC and thought this needed to be evaluated by [place].  He has been nonweightbearing in a Darco shoe.  His wound VAC is full with blood.  No fevers chills or any other systemic symptoms.  Upon removing the wound VAC today, he has active bloody drainage coming from the middle of the incision.  No wound dehiscence.  At this point we will place a new dry dressing over the wound.  He will continue with daily dry dressing changes.  He will remain nonweightbearing.  He will elevate the left lower extremity above the heart as much as possible.  He will follow-up with Dr. due to at his regularly scheduled appointment next week.  Follow-Up Instructions: Return for as already scheduled with Dr. Lajoyce Corners next week.   Orders:  No orders of the defined types were placed in this encounter.  No orders of the defined types were placed in this encounter.   Imaging: No results found.  PMFS History: Patient Active Problem List   Diagnosis Date Noted  . Dehiscence of closure of skin, subsequent encounter   . Dehiscence of amputation stump (HCC) 08/11/2017  . S/P transmetatarsal amputation of foot, left (HCC) 07/19/2017  . Subacute osteomyelitis, left ankle and foot (HCC) 07/17/2017  .  Cutaneous abscess of left foot 07/17/2017  . Cellulitis and abscess of leg 10/18/2014  . Diabetic ulcer of left foot associated with type 2 diabetes mellitus (HCC)   . Diabetes mellitus with renal complications (HCC) 10/17/2014  . Diabetic ulcer of left foot (HCC) 10/13/2014  . Diabetes (HCC) 10/13/2014  . Anemia 10/13/2014  . CKD (chronic kidney disease) stage 3, GFR 30-59 ml/min (HCC) 10/13/2014  . Hyperkalemia 10/13/2014  . Skin ulcer (HCC) 10/13/2014   Past Medical History:  Diagnosis Date  . Arthritis    "joint pain" (08/16/2017)  . CKD (chronic kidney disease) stage 3, GFR 30-59 ml/min (HCC)    creatinine increases with antibiotics per pt, no known kidney disease per patient  . Diabetic foot ulcer (HCC)    left  . Diabetic peripheral neuropathy (HCC)   . GERD (gastroesophageal reflux disease)   . Type II diabetes mellitus (HCC)     Family History  Problem Relation Age of Onset  . Dementia Mother     Past Surgical History:  Procedure Laterality Date  . AMPUTATION Left 07/19/2017   Procedure: LEFT TRANSMETATARSAL AMPUTATION;  Surgeon: Nadara Mustard, MD;  Location: [facility] OR;  Service: Orthopedics;  Laterality: Left;  . AMPUTATION Left 08/16/2017   REVISION LEFT TRANSMETATARSAL AMPUTATION  . CATARACT EXTRACTION W/ INTRAOCULAR LENS  IMPLANT, BILATERAL  ~ 2016  . I&D EXTREMITY Left 10/17/2014  Procedure: IRRIGATION AND DEBRIDEMENT EXTREMITY LEFT FOOT;  Surgeon: Nadara MustardMarcus Duda V, MD;  Location: [facility] OR;  Service: Orthopedics;  Laterality: Left;  . STUMP REVISION Left 08/16/2017   Procedure: REVISION LEFT TRANSMETATARSAL AMPUTATION;  Surgeon: Nadara Mustarduda, Marcus V, MD;  [facility];  Service: Orthopedics;  Laterality: Left;  . TOE AMPUTATION Right 2013   5th toe   Social History   Occupational History  . Not on file  Tobacco Use  . Smoking status: Never Smoker  . Smokeless tobacco: Never Used  Substance and Sexual Activity  . Alcohol use: Yes    Comment: 08/16/2017 "maybe 1 beer 2  times a month"  . Drug use: No  . Sexual activity: Yes

## 2017-08-18 NOTE — Discharge Summary (Signed)
Discharge Diagnoses:  Active Problems:   S/P transmetatarsal amputation of foot, left (HCC)   Dehiscence of closure of skin, subsequent encounter   Surgeries: Procedure(s): REVISION LEFT TRANSMETATARSAL AMPUTATION on 08/16/2017     Discharged Condition: Improved  Hospital Course: Kevin Bradford is an 72 y.o. male who was admitted 08/16/2017 with a chief complaint of No chief complaint on file. , and found to have a diagnosis of Dehiscence Left Transmetatarsal Amputation.  They were brought to the operating room on 08/16/2017 and underwent Procedure(s): REVISION LEFT TRANSMETATARSAL AMPUTATION.    They were given perioperative antibiotics:  Anti-infectives (From admission, onward)   Start     Dose/Rate Route Frequency Ordered Stop   08/16/17 1530  ceFAZolin (ANCEF) IVPB 2g/100 mL premix     2 g 200 mL/hr over 30 Minutes Intravenous Every 6 hours 08/16/17 1147 08/17/17 0434   08/16/17 0800  ceFAZolin (ANCEF) IVPB 2g/100 mL premix     2 g 200 mL/hr over 30 Minutes Intravenous On call to O.R. 08/16/17 4628 08/16/17 1011    .  They were given sequential compression devices, early ambulation, and Other (comment) ASA 325 mg for DVT prophylaxis.  Recent vital signs: No data found..  Recent laboratory studies: No results found.  Discharge Medications:   Allergies as of 08/17/2017      Reactions   Oxycodone Shortness Of Breath      Medication List    STOP taking these medications   doxycycline 100 MG tablet Commonly known as:  VIBRA-TABS     TAKE these medications   blood glucose meter kit and supplies Kit Dispense based on patient and insurance preference. Use up to four times daily as directed. (FOR ICD-9 250.00, 250.01).   ferrous sulfate 325 (65 FE) MG tablet Take 1 tablet (325 mg total) by mouth 2 (two) times daily with a meal.   HYDROcodone-acetaminophen 5-325 MG tablet Commonly known as:  NORCO/VICODIN Take 1 tablet by mouth every 4 (four) hours as needed for moderate  pain.   insulin aspart 100 UNIT/ML FlexPen Commonly known as:  NOVOLOG Inject 6 Units into the skin 3 (three) times daily with meals. What changed:    how much to take  when to take this   Insulin Detemir 100 UNIT/ML Pen Commonly known as:  LEVEMIR Inject 40 Units into the skin daily at 10 pm. What changed:  when to take this   Insulin Pen Needle 32G X 4 MM Misc Use with insulin pens to dispense insulin as directed   metFORMIN 500 MG tablet Commonly known as:  GLUCOPHAGE Take 500 mg by mouth 2 (two) times daily with a meal.   nitroGLYCERIN 0.2 mg/hr patch Commonly known as:  NITRODUR - Dosed in mg/24 hr Place 1 patch (0.2 mg total) onto the skin daily. What changed:  Another medication with the same name was removed. Continue taking this medication, and follow the directions you see here.   OMEGA 3 PO Take 1 capsule by mouth every evening.   omeprazole 20 MG tablet Commonly known as:  PRILOSEC OTC Take 20 mg by mouth daily.   pentoxifylline 400 MG CR tablet Commonly known as:  TRENTAL Take 1 tablet (400 mg total) by mouth 3 (three) times daily with meals.   sulfamethoxazole-trimethoprim 800-160 MG tablet Commonly known as:  BACTRIM DS,SEPTRA DS Take 1 tablet by mouth 2 (two) times daily.       Diagnostic Studies: No results found.  They benefited maximally from their hospital stay  and there were no complications.     Disposition: 01-Home or Self Care  Follow-up Information    Newt Minion, MD In 1 week.   Specialty:  Orthopedic Surgery Contact information: [place] [address] [phone]            Author: Dondra Prader, FNP 08/18/2017 1:40 PM

## 2017-08-21 ENCOUNTER — Telehealth (INDEPENDENT_AMBULATORY_CARE_PROVIDER_SITE_OTHER): Payer: Self-pay | Admitting: Orthopedic Surgery

## 2017-08-21 NOTE — Telephone Encounter (Signed)
Called to advise we will address at office visit tomorrow.

## 2017-08-21 NOTE — Telephone Encounter (Signed)
Please refill.

## 2017-08-21 NOTE — Telephone Encounter (Signed)
Patients wife called on behalf of patient's medication, needing clarification on some medication for his mrsa. Has been taking fulfamethoxazole-tmpdf for it but only has enough for today and the wife thinks this is the medication that he needs to continue taking. CB # [phone]

## 2017-08-22 ENCOUNTER — Ambulatory Visit (INDEPENDENT_AMBULATORY_CARE_PROVIDER_SITE_OTHER): Payer: Medicare Other | Admitting: Orthopedic Surgery

## 2017-08-22 ENCOUNTER — Encounter (INDEPENDENT_AMBULATORY_CARE_PROVIDER_SITE_OTHER): Payer: Self-pay | Admitting: Orthopedic Surgery

## 2017-08-22 VITALS — Ht 74.0 in | Wt 222.0 lb

## 2017-08-22 DIAGNOSIS — Z89432 Acquired absence of left foot: Secondary | ICD-10-CM

## 2017-08-22 NOTE — Progress Notes (Signed)
Office Visit Note   Patient: Kevin Bradford           Date of Birth: 02-Dec-1945           MRN: [id number] Visit Date: 08/22/2017              Requested by: Georgann HousekeeperHusain, Karrar, MD [address] [place], [place] [address] PCP: Georgann HousekeeperHusain, Karrar, MD  Chief Complaint  Patient presents with  . Left Foot - Routine Post Op    08/16/17 revision left transmet amputation       HPI: Patient presents 1 week status post revision left transmetatarsal irritation.  Patient is just completed his course of sulfamethoxazole trimethoprim.  Assessment & Plan: Visit Diagnoses:  1. S/P transmetatarsal amputation of foot, left (HCC)     Plan: Continue Triental continue the nitroglycerin patch start Dial soap cleansing dressing changes protected weightbearing evaluate for suture removal in 1 week.  Follow-Up Instructions: Return in about 1 week (around 08/29/2017).   Ortho Exam  Patient is alert, oriented, no adenopathy, well-dressed, normal affect, normal respiratory effort. Examination the incision is well approximated there is no wound dehiscence no cellulitis no odor there is a small amount of serosanguineous drainage no signs of infection.  Imaging: No results found. No images are attached to the encounter.  Labs: Lab Results  Component Value Date   HGBA1C 9.3 (H) 07/19/2017   HGBA1C 11.3 (H) 10/14/2014   HGBA1C 10.6 (H) 10/13/2014   ESRSEDRATE 119 (H) 10/18/2014   ESRSEDRATE 115 (H) 10/13/2014   CRP 8.5 (H) 10/18/2014   CRP 21.9 (H) 10/13/2014   LABURIC 3.3 (L) 10/14/2014   REPTSTATUS 07/24/2017 FINAL 07/19/2017   GRAMSTAIN  07/19/2017    FEW WBC PRESENT, PREDOMINANTLY MONONUCLEAR MODERATE GRAM POSITIVE COCCI IN PAIRS MODERATE GRAM NEGATIVE RODS    CULT  07/19/2017    MODERATE ENTEROCOCCUS FAECALIS FEW METHICILLIN RESISTANT STAPHYLOCOCCUS AUREUS MODERATE BACTEROIDES ORALIS BETA LACTAMASE POSITIVE    LABORGA METHICILLIN RESISTANT STAPHYLOCOCCUS AUREUS 07/19/2017   LABORGA ENTEROCOCCUS FAECALIS 07/19/2017    @LABSALLVALUES (HGBA1)@  Body mass index is 28.5 kg/m.  Orders:  No orders of the defined types were placed in this encounter.  No orders of the defined types were placed in this encounter.    Procedures: No procedures performed  Clinical Data: No additional findings.  ROS:  All other systems negative, except as noted in the HPI. Review of Systems  Objective: Vital Signs: Ht 6\' 2"  (1.88 m)   Wt 222 lb (100.7 kg)   BMI 28.50 kg/m   Specialty Comments:  No specialty comments available.  PMFS History: Patient Active Problem List   Diagnosis Date Noted  . Dehiscence of closure of skin, subsequent encounter   . Dehiscence of amputation stump (HCC) 08/11/2017  . S/P transmetatarsal amputation of foot, left (HCC) 07/19/2017  . Subacute osteomyelitis, left ankle and foot (HCC) 07/17/2017  . Cutaneous abscess of left foot 07/17/2017  . Cellulitis and abscess of leg 10/18/2014  . Diabetic ulcer of left foot associated with type 2 diabetes mellitus (HCC)   . Diabetes mellitus with renal complications (HCC) 10/17/2014  . Diabetic ulcer of left foot (HCC) 10/13/2014  . Diabetes (HCC) 10/13/2014  . Anemia 10/13/2014  . CKD (chronic kidney disease) stage 3, GFR 30-59 ml/min (HCC) 10/13/2014  . Hyperkalemia 10/13/2014  . Skin ulcer (HCC) 10/13/2014   Past Medical History:  Diagnosis Date  . Arthritis    "joint pain" (08/16/2017)  . CKD (chronic kidney disease) stage  3, GFR 30-59 ml/min (HCC)    creatinine increases with antibiotics per pt, no known kidney disease per patient  . Diabetic foot ulcer (HCC)    left  . Diabetic peripheral neuropathy (HCC)   . GERD (gastroesophageal reflux disease)   . Type II diabetes mellitus (HCC)     Family History  Problem Relation Age of Onset  . Dementia Mother     Past Surgical History:  Procedure Laterality Date  . AMPUTATION Left 07/19/2017   Procedure: LEFT TRANSMETATARSAL AMPUTATION;   Surgeon: Nadara Mustard, MD;  Location: [facility] OR;  Service: Orthopedics;  Laterality: Left;  . AMPUTATION Left 08/16/2017   REVISION LEFT TRANSMETATARSAL AMPUTATION  . CATARACT EXTRACTION W/ INTRAOCULAR LENS  IMPLANT, BILATERAL  ~ 2016  . I&D EXTREMITY Left 10/17/2014   Procedure: IRRIGATION AND DEBRIDEMENT EXTREMITY LEFT FOOT;  Surgeon: Nadara Mustard, MD;  Location: [facility] OR;  Service: Orthopedics;  Laterality: Left;  . STUMP REVISION Left 08/16/2017   Procedure: REVISION LEFT TRANSMETATARSAL AMPUTATION;  Surgeon: Nadara Mustard, MD;  Location: [facility] OR;  Service: Orthopedics;  Laterality: Left;  . TOE AMPUTATION Right 2013   5th toe   Social History   Occupational History  . Not on file  Tobacco Use  . Smoking status: Never Smoker  . Smokeless tobacco: Never Used  Substance and Sexual Activity  . Alcohol use: Yes    Comment: 08/16/2017 "maybe 1 beer 2 times a month"  . Drug use: No  . Sexual activity: Yes

## 2017-08-23 ENCOUNTER — Inpatient Hospital Stay (INDEPENDENT_AMBULATORY_CARE_PROVIDER_SITE_OTHER): Payer: Medicare Other | Admitting: Family

## 2017-08-28 DIAGNOSIS — E1140 Type 2 diabetes mellitus with diabetic neuropathy, unspecified: Secondary | ICD-10-CM | POA: Diagnosis not present

## 2017-08-28 DIAGNOSIS — K219 Gastro-esophageal reflux disease without esophagitis: Secondary | ICD-10-CM | POA: Diagnosis not present

## 2017-08-28 DIAGNOSIS — Z794 Long term (current) use of insulin: Secondary | ICD-10-CM | POA: Diagnosis not present

## 2017-08-28 DIAGNOSIS — Z4781 Encounter for orthopedic aftercare following surgical amputation: Secondary | ICD-10-CM | POA: Diagnosis not present

## 2017-08-28 DIAGNOSIS — N183 Chronic kidney disease, stage 3 (moderate): Secondary | ICD-10-CM | POA: Diagnosis not present

## 2017-08-28 DIAGNOSIS — E1122 Type 2 diabetes mellitus with diabetic chronic kidney disease: Secondary | ICD-10-CM | POA: Diagnosis not present

## 2017-08-30 ENCOUNTER — Ambulatory Visit (INDEPENDENT_AMBULATORY_CARE_PROVIDER_SITE_OTHER): Payer: Medicare Other | Admitting: Family

## 2017-08-30 ENCOUNTER — Encounter (INDEPENDENT_AMBULATORY_CARE_PROVIDER_SITE_OTHER): Payer: Self-pay | Admitting: Family

## 2017-08-30 VITALS — Ht 74.0 in | Wt 222.0 lb

## 2017-08-30 DIAGNOSIS — Z89432 Acquired absence of left foot: Secondary | ICD-10-CM

## 2017-08-30 DIAGNOSIS — T8781 Dehiscence of amputation stump: Secondary | ICD-10-CM

## 2017-08-30 NOTE — Progress Notes (Signed)
Post-Op Visit Note   Patient: Kevin Bradford           Date of Birth: [birth year]           MRN: [id number] Visit Date: 08/30/2017 PCP: Georgann HousekeeperHusain, Karrar, MD  Chief Complaint:  Chief Complaint  Patient presents with  . Left Foot - Routine Post Op    08/16/17 left foot revision transmet amputation     HPI:  HPI The patient is a 72 year old gentleman seen today status post left foot revision of transmetatarsal amputation.  Ortho Exam The incision is well approximated with sutures. No gaping, no drainage. No erythema. No odor or sign of infection.  Visit Diagnoses:  1. Dehiscence of amputation stump (HCC)   2. S/P transmetatarsal amputation of foot, left (HCC)     Plan: follow up in office in 1 week for suture removal. Continue with daily incisional cleansing. Heel walking to bathroom only.  Follow-Up Instructions: Return in about 1 week (around 09/06/2017).   Imaging: No results found.  Orders:  No orders of the defined types were placed in this encounter.  No orders of the defined types were placed in this encounter.    PMFS History: Patient Active Problem List   Diagnosis Date Noted  . Dehiscence of amputation stump (HCC) 08/11/2017  . S/P transmetatarsal amputation of foot, left (HCC) 07/19/2017  . Subacute osteomyelitis, left ankle and foot (HCC) 07/17/2017  . Diabetic ulcer of left foot associated with type 2 diabetes mellitus (HCC)   . Diabetes mellitus with renal complications (HCC) 10/17/2014  . Diabetes (HCC) 10/13/2014  . Anemia 10/13/2014  . CKD (chronic kidney disease) stage 3, GFR 30-59 ml/min (HCC) 10/13/2014  . Hyperkalemia 10/13/2014   Past Medical History:  Diagnosis Date  . Arthritis    "joint pain" (08/16/2017)  . Cellulitis and abscess of leg 10/18/2014  . CKD (chronic kidney disease) stage 3, GFR 30-59 ml/min (HCC)    creatinine increases with antibiotics per pt, no known kidney disease per patient  . Dehiscence of closure of skin, subsequent  encounter   . Diabetic foot ulcer (HCC)    left  . Diabetic peripheral neuropathy (HCC)   . GERD (gastroesophageal reflux disease)   . Type II diabetes mellitus (HCC)     Family History  Problem Relation Age of Onset  . Dementia Mother     Past Surgical History:  Procedure Laterality Date  . AMPUTATION Left 07/19/2017   Procedure: LEFT TRANSMETATARSAL AMPUTATION;  Surgeon: Nadara Mustarduda, Marcus V, MD;  [facility];  Service: Orthopedics;  Laterality: Left;  . AMPUTATION Left 08/16/2017   REVISION LEFT TRANSMETATARSAL AMPUTATION  . CATARACT EXTRACTION W/ INTRAOCULAR LENS  IMPLANT, BILATERAL  ~ 2016  . I&D EXTREMITY Left 10/17/2014   Procedure: IRRIGATION AND DEBRIDEMENT EXTREMITY LEFT FOOT;  Surgeon: Nadara MustardMarcus Duda V, MD;  Location: [facility] OR;  Service: Orthopedics;  Laterality: Left;  . STUMP REVISION Left 08/16/2017   Procedure: REVISION LEFT TRANSMETATARSAL AMPUTATION;  Surgeon: Nadara Mustarduda, Marcus V, MD;  [facility];  Service: Orthopedics;  Laterality: Left;  . TOE AMPUTATION Right 2013   5th toe   Social History   Occupational History  . Not on file  Tobacco Use  . Smoking status: Never Smoker  . Smokeless tobacco: Never Used  Substance and Sexual Activity  . Alcohol use: Yes    Comment: 08/16/2017 "maybe 1 beer 2 times a month"  . Drug use: No  . Sexual activity: Yes

## 2017-09-01 DIAGNOSIS — K219 Gastro-esophageal reflux disease without esophagitis: Secondary | ICD-10-CM | POA: Diagnosis not present

## 2017-09-01 DIAGNOSIS — N183 Chronic kidney disease, stage 3 (moderate): Secondary | ICD-10-CM | POA: Diagnosis not present

## 2017-09-01 DIAGNOSIS — Z794 Long term (current) use of insulin: Secondary | ICD-10-CM | POA: Diagnosis not present

## 2017-09-01 DIAGNOSIS — E1122 Type 2 diabetes mellitus with diabetic chronic kidney disease: Secondary | ICD-10-CM | POA: Diagnosis not present

## 2017-09-01 DIAGNOSIS — E1140 Type 2 diabetes mellitus with diabetic neuropathy, unspecified: Secondary | ICD-10-CM | POA: Diagnosis not present

## 2017-09-01 DIAGNOSIS — Z4781 Encounter for orthopedic aftercare following surgical amputation: Secondary | ICD-10-CM | POA: Diagnosis not present

## 2017-09-04 DIAGNOSIS — K219 Gastro-esophageal reflux disease without esophagitis: Secondary | ICD-10-CM | POA: Diagnosis not present

## 2017-09-04 DIAGNOSIS — N183 Chronic kidney disease, stage 3 (moderate): Secondary | ICD-10-CM | POA: Diagnosis not present

## 2017-09-04 DIAGNOSIS — E1140 Type 2 diabetes mellitus with diabetic neuropathy, unspecified: Secondary | ICD-10-CM | POA: Diagnosis not present

## 2017-09-04 DIAGNOSIS — Z794 Long term (current) use of insulin: Secondary | ICD-10-CM | POA: Diagnosis not present

## 2017-09-04 DIAGNOSIS — Z4781 Encounter for orthopedic aftercare following surgical amputation: Secondary | ICD-10-CM | POA: Diagnosis not present

## 2017-09-04 DIAGNOSIS — E1122 Type 2 diabetes mellitus with diabetic chronic kidney disease: Secondary | ICD-10-CM | POA: Diagnosis not present

## 2017-09-05 ENCOUNTER — Ambulatory Visit (INDEPENDENT_AMBULATORY_CARE_PROVIDER_SITE_OTHER): Payer: Medicare Other | Admitting: Orthopedic Surgery

## 2017-09-06 ENCOUNTER — Encounter (INDEPENDENT_AMBULATORY_CARE_PROVIDER_SITE_OTHER): Payer: Self-pay | Admitting: Orthopedic Surgery

## 2017-09-06 ENCOUNTER — Ambulatory Visit (INDEPENDENT_AMBULATORY_CARE_PROVIDER_SITE_OTHER): Payer: Medicare Other | Admitting: Orthopedic Surgery

## 2017-09-06 VITALS — Ht 74.0 in | Wt 222.0 lb

## 2017-09-06 DIAGNOSIS — Z89432 Acquired absence of left foot: Secondary | ICD-10-CM

## 2017-09-06 NOTE — Progress Notes (Signed)
Office Visit Note   Patient: Kevin Bradford           Date of Birth: 1946-07-16           MRN: [id number] Visit Date: 09/06/2017              Requested by: Georgann Housekeeper, MD [address] [place], [place] [address] PCP: Georgann Housekeeper, MD  Chief Complaint  Patient presents with  . Left Foot - Routine Post Op    Left foot revision transmet amputation 08/16/17      HPI: Patient presents 3 weeks status post revision left transmetatarsal amputation.  He is currently on nitroglycerin patch and Trental  Assessment & Plan: Visit Diagnoses:  1. S/P transmetatarsal amputation of foot, left (HCC)     Plan: Continue with the nitroglycerin patch continue with the Trental Dial soap cleansing moisturizing lotion heel cord stretching.  Patient was given a prescription for [employer] for extra-depth shoes custom orthotic spacer carbon fiber plate and double upright braces.  Follow-Up Instructions: Return in about 3 weeks (around 09/27/2017).   Ortho Exam  Patient is alert, oriented, no adenopathy, well-dressed, normal affect, normal respiratory effort. Examination the incision is well-healed there is no redness no cellulitis no drainage no signs of infection.  Imaging: No results found. No images are attached to the encounter.  Labs: Lab Results  Component Value Date   HGBA1C 9.3 (H) 07/19/2017   HGBA1C 11.3 (H) 10/14/2014   HGBA1C 10.6 (H) 10/13/2014   ESRSEDRATE 119 (H) 10/18/2014   ESRSEDRATE 115 (H) 10/13/2014   CRP 8.5 (H) 10/18/2014   CRP 21.9 (H) 10/13/2014   LABURIC 3.3 (L) 10/14/2014   REPTSTATUS 07/24/2017 FINAL 07/19/2017   GRAMSTAIN  07/19/2017    FEW WBC PRESENT, PREDOMINANTLY MONONUCLEAR MODERATE GRAM POSITIVE COCCI IN PAIRS MODERATE GRAM NEGATIVE RODS    CULT  07/19/2017    MODERATE ENTEROCOCCUS FAECALIS FEW METHICILLIN RESISTANT STAPHYLOCOCCUS AUREUS MODERATE BACTEROIDES ORALIS BETA LACTAMASE POSITIVE    LABORGA METHICILLIN RESISTANT  STAPHYLOCOCCUS AUREUS 07/19/2017   LABORGA ENTEROCOCCUS FAECALIS 07/19/2017    @LABSALLVALUES (HGBA1)@  Body mass index is 28.5 kg/m.  Orders:  No orders of the defined types were placed in this encounter.  No orders of the defined types were placed in this encounter.    Procedures: No procedures performed  Clinical Data: No additional findings.  ROS:  All other systems negative, except as noted in the HPI. Review of Systems  Objective: Vital Signs: Ht 6\' 2"  (1.88 m)   Wt 222 lb (100.7 kg)   BMI 28.50 kg/m   Specialty Comments:  No specialty comments available.  PMFS History: Patient Active Problem List   Diagnosis Date Noted  . Dehiscence of amputation stump (HCC) 08/11/2017  . S/P transmetatarsal amputation of foot, left (HCC) 07/19/2017  . Subacute osteomyelitis, left ankle and foot (HCC) 07/17/2017  . Diabetic ulcer of left foot associated with type 2 diabetes mellitus (HCC)   . Diabetes mellitus with renal complications (HCC) 10/17/2014  . Diabetes (HCC) 10/13/2014  . Anemia 10/13/2014  . CKD (chronic kidney disease) stage 3, GFR 30-59 ml/min (HCC) 10/13/2014  . Hyperkalemia 10/13/2014   Past Medical History:  Diagnosis Date  . Arthritis    "joint pain" (08/16/2017)  . Cellulitis and abscess of leg 10/18/2014  . CKD (chronic kidney disease) stage 3, GFR 30-59 ml/min (HCC)    creatinine increases with antibiotics per pt, no known kidney disease per patient  . Dehiscence of closure of  skin, subsequent encounter   . Diabetic foot ulcer (HCC)    left  . Diabetic peripheral neuropathy (HCC)   . GERD (gastroesophageal reflux disease)   . Type II diabetes mellitus (HCC)     Family History  Problem Relation Age of Onset  . Dementia Mother     Past Surgical History:  Procedure Laterality Date  . AMPUTATION Left 07/19/2017   Procedure: LEFT TRANSMETATARSAL AMPUTATION;  Surgeon: Nadara Mustarduda, Marcus V, MD;  [facility];  Service: Orthopedics;  Laterality: Left;    . AMPUTATION Left 08/16/2017   REVISION LEFT TRANSMETATARSAL AMPUTATION  . CATARACT EXTRACTION W/ INTRAOCULAR LENS  IMPLANT, BILATERAL  ~ 2016  . I&D EXTREMITY Left 10/17/2014   Procedure: IRRIGATION AND DEBRIDEMENT EXTREMITY LEFT FOOT;  Surgeon: Nadara MustardMarcus Duda V, MD;  Location: [facility] OR;  Service: Orthopedics;  Laterality: Left;  . STUMP REVISION Left 08/16/2017   Procedure: REVISION LEFT TRANSMETATARSAL AMPUTATION;  Surgeon: Nadara Mustarduda, Marcus V, MD;  [facility];  Service: Orthopedics;  Laterality: Left;  . TOE AMPUTATION Right 2013   5th toe   Social History   Occupational History  . Not on file  Tobacco Use  . Smoking status: Never Smoker  . Smokeless tobacco: Never Used  Substance and Sexual Activity  . Alcohol use: Yes    Comment: 08/16/2017 "maybe 1 beer 2 times a month"  . Drug use: No  . Sexual activity: Yes

## 2017-09-08 DIAGNOSIS — Z4781 Encounter for orthopedic aftercare following surgical amputation: Secondary | ICD-10-CM | POA: Diagnosis not present

## 2017-09-08 DIAGNOSIS — E1140 Type 2 diabetes mellitus with diabetic neuropathy, unspecified: Secondary | ICD-10-CM | POA: Diagnosis not present

## 2017-09-08 DIAGNOSIS — Z794 Long term (current) use of insulin: Secondary | ICD-10-CM | POA: Diagnosis not present

## 2017-09-08 DIAGNOSIS — K219 Gastro-esophageal reflux disease without esophagitis: Secondary | ICD-10-CM | POA: Diagnosis not present

## 2017-09-08 DIAGNOSIS — E1122 Type 2 diabetes mellitus with diabetic chronic kidney disease: Secondary | ICD-10-CM | POA: Diagnosis not present

## 2017-09-08 DIAGNOSIS — N183 Chronic kidney disease, stage 3 (moderate): Secondary | ICD-10-CM | POA: Diagnosis not present

## 2017-09-12 ENCOUNTER — Encounter (INDEPENDENT_AMBULATORY_CARE_PROVIDER_SITE_OTHER): Payer: Self-pay | Admitting: Orthopedic Surgery

## 2017-09-12 ENCOUNTER — Ambulatory Visit (INDEPENDENT_AMBULATORY_CARE_PROVIDER_SITE_OTHER): Payer: Medicare Other | Admitting: Orthopedic Surgery

## 2017-09-12 VITALS — Ht 74.0 in | Wt 222.0 lb

## 2017-09-12 DIAGNOSIS — Z89432 Acquired absence of left foot: Secondary | ICD-10-CM

## 2017-09-12 NOTE — Progress Notes (Signed)
Office Visit Note   Patient: Kevin Bradford           Date of Birth: 1946-02-24           MRN: [id number] Visit Date: 09/12/2017              Requested by: Georgann Housekeeper, MD [address] [place], [place] [address] PCP: Georgann Housekeeper, MD  Chief Complaint  Patient presents with  . Left Foot - Open Wound    Work in for open area left foot hx transmet revision on 07/19/17      HPI: Patient is 4 weeks status post revision left transmetatarsal amputation.  Patient noticed a new blister on the plantar aspect of his foot from where wound care had applied tape this pulled off his skin.  Patient is concerned that his socket is too small.  Assessment & Plan: Visit Diagnoses:  1. S/P transmetatarsal amputation of foot, left (HCC)     Plan: Patient's calf measures 38 cm in circumference this in the exact middle of the size large sock he will go ahead and get a size large the medium was applied today the medium goes up to 38 cm in circumference.  He will wear the sock around the clock change the sock daily to inspect the foot and wash with soap and water.  Follow-Up Instructions: Return in about 1 week (around 09/19/2017).   Ortho Exam  Patient is alert, oriented, no adenopathy, well-dressed, normal affect, normal respiratory effort. Examination patient has a skin tear on the plantar aspect of his foot that is 1 x 2 cm in 0.1 mm deep this has healthy tissue.  The surgical incision is well-healed.  He is wearing the nitroglycerin patch.  There is no redness no cellulitis he does have swelling in the foot ankle and calf.  His calf measures 38 cm in circumference.  Imaging: No results found. No images are attached to the encounter.  Labs: Lab Results  Component Value Date   HGBA1C 9.3 (H) 07/19/2017   HGBA1C 11.3 (H) 10/14/2014   HGBA1C 10.6 (H) 10/13/2014   ESRSEDRATE 119 (H) 10/18/2014   ESRSEDRATE 115 (H) 10/13/2014   CRP 8.5 (H) 10/18/2014   CRP 21.9 (H) 10/13/2014   LABURIC 3.3 (L) 10/14/2014   REPTSTATUS 07/24/2017 FINAL 07/19/2017   GRAMSTAIN  07/19/2017    FEW WBC PRESENT, PREDOMINANTLY MONONUCLEAR MODERATE GRAM POSITIVE COCCI IN PAIRS MODERATE GRAM NEGATIVE RODS    CULT  07/19/2017    MODERATE ENTEROCOCCUS FAECALIS FEW METHICILLIN RESISTANT STAPHYLOCOCCUS AUREUS MODERATE BACTEROIDES ORALIS BETA LACTAMASE POSITIVE    LABORGA METHICILLIN RESISTANT STAPHYLOCOCCUS AUREUS 07/19/2017   LABORGA ENTEROCOCCUS FAECALIS 07/19/2017    @LABSALLVALUES (HGBA1)@  Body mass index is 28.5 kg/m.  Orders:  No orders of the defined types were placed in this encounter.  No orders of the defined types were placed in this encounter.    Procedures: No procedures performed  Clinical Data: No additional findings.  ROS:  All other systems negative, except as noted in the HPI. Review of Systems  Objective: Vital Signs: Ht 6\' 2"  (1.88 m)   Wt 222 lb (100.7 kg)   BMI 28.50 kg/m   Specialty Comments:  No specialty comments available.  PMFS History: Patient Active Problem List   Diagnosis Date Noted  . Dehiscence of amputation stump (HCC) 08/11/2017  . S/P transmetatarsal amputation of foot, left (HCC) 07/19/2017  . Subacute osteomyelitis, left ankle and foot (HCC) 07/17/2017  . Diabetic ulcer of  left foot associated with type 2 diabetes mellitus (HCC)   . Diabetes mellitus with renal complications (HCC) 10/17/2014  . Diabetes (HCC) 10/13/2014  . Anemia 10/13/2014  . CKD (chronic kidney disease) stage 3, GFR 30-59 ml/min (HCC) 10/13/2014  . Hyperkalemia 10/13/2014   Past Medical History:  Diagnosis Date  . Arthritis    "joint pain" (08/16/2017)  . Cellulitis and abscess of leg 10/18/2014  . CKD (chronic kidney disease) stage 3, GFR 30-59 ml/min (HCC)    creatinine increases with antibiotics per pt, no known kidney disease per patient  . Dehiscence of closure of skin, subsequent encounter   . Diabetic foot ulcer (HCC)    left  . Diabetic  peripheral neuropathy (HCC)   . GERD (gastroesophageal reflux disease)   . Type II diabetes mellitus (HCC)     Family History  Problem Relation Age of Onset  . Dementia Mother     Past Surgical History:  Procedure Laterality Date  . AMPUTATION Left 07/19/2017   Procedure: LEFT TRANSMETATARSAL AMPUTATION;  Surgeon: Nadara Mustarduda, Anagabriela Jokerst V, MD;  [facility];  Service: Orthopedics;  Laterality: Left;  . AMPUTATION Left 08/16/2017   REVISION LEFT TRANSMETATARSAL AMPUTATION  . CATARACT EXTRACTION W/ INTRAOCULAR LENS  IMPLANT, BILATERAL  ~ 2016  . I&D EXTREMITY Left 10/17/2014   Procedure: IRRIGATION AND DEBRIDEMENT EXTREMITY LEFT FOOT;  Surgeon: Nadara MustardMarcus Junell Cullifer V, MD;  Location: [facility] OR;  Service: Orthopedics;  Laterality: Left;  . STUMP REVISION Left 08/16/2017   Procedure: REVISION LEFT TRANSMETATARSAL AMPUTATION;  Surgeon: Nadara Mustarduda, Madelyn Tlatelpa V, MD;  [facility];  Service: Orthopedics;  Laterality: Left;  . TOE AMPUTATION Right 2013   5th toe   Social History   Occupational History  . Not on file  Tobacco Use  . Smoking status: Never Smoker  . Smokeless tobacco: Never Used  Substance and Sexual Activity  . Alcohol use: Yes    Comment: 08/16/2017 "maybe 1 beer 2 times a month"  . Drug use: No  . Sexual activity: Yes

## 2017-09-15 DIAGNOSIS — E1122 Type 2 diabetes mellitus with diabetic chronic kidney disease: Secondary | ICD-10-CM | POA: Diagnosis not present

## 2017-09-15 DIAGNOSIS — Z4781 Encounter for orthopedic aftercare following surgical amputation: Secondary | ICD-10-CM | POA: Diagnosis not present

## 2017-09-15 DIAGNOSIS — K219 Gastro-esophageal reflux disease without esophagitis: Secondary | ICD-10-CM | POA: Diagnosis not present

## 2017-09-15 DIAGNOSIS — Z794 Long term (current) use of insulin: Secondary | ICD-10-CM | POA: Diagnosis not present

## 2017-09-15 DIAGNOSIS — E1140 Type 2 diabetes mellitus with diabetic neuropathy, unspecified: Secondary | ICD-10-CM | POA: Diagnosis not present

## 2017-09-15 DIAGNOSIS — N183 Chronic kidney disease, stage 3 (moderate): Secondary | ICD-10-CM | POA: Diagnosis not present

## 2017-09-21 DIAGNOSIS — K219 Gastro-esophageal reflux disease without esophagitis: Secondary | ICD-10-CM | POA: Diagnosis not present

## 2017-09-21 DIAGNOSIS — E1140 Type 2 diabetes mellitus with diabetic neuropathy, unspecified: Secondary | ICD-10-CM | POA: Diagnosis not present

## 2017-09-21 DIAGNOSIS — N183 Chronic kidney disease, stage 3 (moderate): Secondary | ICD-10-CM | POA: Diagnosis not present

## 2017-09-21 DIAGNOSIS — Z4781 Encounter for orthopedic aftercare following surgical amputation: Secondary | ICD-10-CM | POA: Diagnosis not present

## 2017-09-21 DIAGNOSIS — Z794 Long term (current) use of insulin: Secondary | ICD-10-CM | POA: Diagnosis not present

## 2017-09-21 DIAGNOSIS — E1122 Type 2 diabetes mellitus with diabetic chronic kidney disease: Secondary | ICD-10-CM | POA: Diagnosis not present

## 2017-09-27 ENCOUNTER — Encounter (INDEPENDENT_AMBULATORY_CARE_PROVIDER_SITE_OTHER): Payer: Self-pay | Admitting: Orthopedic Surgery

## 2017-09-27 ENCOUNTER — Ambulatory Visit (INDEPENDENT_AMBULATORY_CARE_PROVIDER_SITE_OTHER): Payer: Medicare Other | Admitting: Orthopedic Surgery

## 2017-09-27 VITALS — Ht 74.0 in | Wt 222.0 lb

## 2017-09-27 DIAGNOSIS — L97421 Non-pressure chronic ulcer of left heel and midfoot limited to breakdown of skin: Secondary | ICD-10-CM

## 2017-09-27 DIAGNOSIS — Z89432 Acquired absence of left foot: Secondary | ICD-10-CM

## 2017-09-27 DIAGNOSIS — E11621 Type 2 diabetes mellitus with foot ulcer: Secondary | ICD-10-CM

## 2017-09-27 NOTE — Progress Notes (Signed)
Office Visit Note   Patient: Kevin Bradford           Date of Birth: [birth year]           MRN: [id number] Visit Date: 09/27/2017              Requested by: Georgann HousekeeperHusain, Karrar, MD [address] [place], [place] [address] PCP: Georgann HousekeeperHusain, Karrar, MD  Chief Complaint  Patient presents with  . Left Foot - Routine Post Op, Wound Check    08/16/17 revision left transmet amputation      HPI: Patient presents in follow-up status post transmetatarsal" he now has a white grade 1 ulcer on the plantar aspect of the left foot.  Assessment & Plan: Visit Diagnoses:  1. S/P transmetatarsal amputation of foot, left (HCC)   2. Diabetic ulcer of left midfoot associated with type 2 diabetes mellitus, limited to breakdown of skin (HCC)     Plan: Recommended that he use a scooter to unload pressure from the left foot.  Recommended using a felt relieving down this was fabricated for him to protect the ulcer.  Patient does have shoes orthotics carbon fiber plate being fabricated by [facility].  He is holding on the decision for the double upright braces.  Follow-Up Instructions: Return in about 3 weeks (around 10/18/2017).   Ortho Exam  Patient is alert, oriented, no adenopathy, well-dressed, normal affect, normal respiratory effort. Examination the surgical incision is well-healed there is no redness no cellulitis no drainage.  Patient has a Wegener grade 1 ulcer on the plantar lateral aspect the left foot this is 0.1 mm deep and 2 cm in diameter.  A 10 blade knife was used to debride the fibrinous tissue there is good healthy granulation tissue at the base there is no exposed bone tendon there is no depth to the wound there is no drainage no cellulitis.  Imaging: No results found. No images are attached to the encounter.  Labs: Lab Results  Component Value Date   HGBA1C 9.3 (H) 07/19/2017   HGBA1C 11.3 (H) 10/14/2014   HGBA1C 10.6 (H) 10/13/2014   ESRSEDRATE 119 (H) 10/18/2014   ESRSEDRATE  115 (H) 10/13/2014   CRP 8.5 (H) 10/18/2014   CRP 21.9 (H) 10/13/2014   LABURIC 3.3 (L) 10/14/2014   REPTSTATUS 07/24/2017 FINAL 07/19/2017   GRAMSTAIN  07/19/2017    FEW WBC PRESENT, PREDOMINANTLY MONONUCLEAR MODERATE GRAM POSITIVE COCCI IN PAIRS MODERATE GRAM NEGATIVE RODS    CULT  07/19/2017    MODERATE ENTEROCOCCUS FAECALIS FEW METHICILLIN RESISTANT STAPHYLOCOCCUS AUREUS MODERATE BACTEROIDES ORALIS BETA LACTAMASE POSITIVE    LABORGA METHICILLIN RESISTANT STAPHYLOCOCCUS AUREUS 07/19/2017   LABORGA ENTEROCOCCUS FAECALIS 07/19/2017    @LABSALLVALUES (HGBA1)@  Body mass index is 28.5 kg/m.  Orders:  No orders of the defined types were placed in this encounter.  No orders of the defined types were placed in this encounter.    Procedures: No procedures performed  Clinical Data: No additional findings.  ROS:  All other systems negative, except as noted in the HPI. Review of Systems  Objective: Vital Signs: Ht 6\' 2"  (1.88 m)   Wt 222 lb (100.7 kg)   BMI 28.50 kg/m   Specialty Comments:  No specialty comments available.  PMFS History: Patient Active Problem List   Diagnosis Date Noted  . Dehiscence of amputation stump (HCC) 08/11/2017  . S/P transmetatarsal amputation of foot, left (HCC) 07/19/2017  . Subacute osteomyelitis, left ankle and foot (HCC) 07/17/2017  . Diabetic ulcer  of left foot associated with type 2 diabetes mellitus (HCC)   . Diabetes mellitus with renal complications (HCC) 10/17/2014  . Diabetes (HCC) 10/13/2014  . Anemia 10/13/2014  . CKD (chronic kidney disease) stage 3, GFR 30-59 ml/min (HCC) 10/13/2014  . Hyperkalemia 10/13/2014   Past Medical History:  Diagnosis Date  . Arthritis    "joint pain" (08/16/2017)  . Cellulitis and abscess of leg 10/18/2014  . CKD (chronic kidney disease) stage 3, GFR 30-59 ml/min (HCC)    creatinine increases with antibiotics per pt, no known kidney disease per patient  . Dehiscence of closure of skin,  subsequent encounter   . Diabetic foot ulcer (HCC)    left  . Diabetic peripheral neuropathy (HCC)   . GERD (gastroesophageal reflux disease)   . Type II diabetes mellitus (HCC)     Family History  Problem Relation Age of Onset  . Dementia Mother     Past Surgical History:  Procedure Laterality Date  . AMPUTATION Left 07/19/2017   Procedure: LEFT TRANSMETATARSAL AMPUTATION;  Surgeon: Nadara Mustard, MD;  Location[facility];  Service: Orthopedics;  Laterality: Left;  . AMPUTATION Left 08/16/2017   REVISION LEFT TRANSMETATARSAL AMPUTATION  . CATARACT EXTRACTION W/ INTRAOCULAR LENS  IMPLANT, BILATERAL  ~ 2016  . I&D EXTREMITY Left 10/17/2014   Procedure: IRRIGATION AND DEBRIDEMENT EXTREMITY LEFT FOOT;  Surgeon: Nadara Mustard, MD;  Location: [facility] OR;  Service: Orthopedics;  Laterality: Left;  . STUMP REVISION Left 08/16/2017   Procedure: REVISION LEFT TRANSMETATARSAL AMPUTATION;  Surgeon: Nadara Mustard, MD;  Location: [facility] OR;  Service: Orthopedics;  Laterality: Left;  . TOE AMPUTATION Right 2013   5th toe   Social History   Occupational History  . Not on file  Tobacco Use  . Smoking status: Never Smoker  . Smokeless tobacco: Never Used  Substance and Sexual Activity  . Alcohol use: Yes    Comment: 08/16/2017 "maybe 1 beer 2 times a month"  . Drug use: No  . Sexual activity: Yes

## 2017-10-16 ENCOUNTER — Telehealth (INDEPENDENT_AMBULATORY_CARE_PROVIDER_SITE_OTHER): Payer: Self-pay | Admitting: Orthopedic Surgery

## 2017-10-16 NOTE — Telephone Encounter (Signed)
08/30/2017 ov notes to [facility] along with Rx/CMN

## 2017-10-18 ENCOUNTER — Ambulatory Visit (INDEPENDENT_AMBULATORY_CARE_PROVIDER_SITE_OTHER): Payer: Medicare Other | Admitting: Orthopedic Surgery

## 2017-10-23 DIAGNOSIS — E1140 Type 2 diabetes mellitus with diabetic neuropathy, unspecified: Secondary | ICD-10-CM | POA: Diagnosis not present

## 2017-10-23 DIAGNOSIS — E1122 Type 2 diabetes mellitus with diabetic chronic kidney disease: Secondary | ICD-10-CM | POA: Diagnosis not present

## 2017-10-23 DIAGNOSIS — E11621 Type 2 diabetes mellitus with foot ulcer: Secondary | ICD-10-CM | POA: Diagnosis not present

## 2017-10-23 DIAGNOSIS — E7800 Pure hypercholesterolemia, unspecified: Secondary | ICD-10-CM | POA: Diagnosis not present

## 2017-10-23 DIAGNOSIS — Z1389 Encounter for screening for other disorder: Secondary | ICD-10-CM | POA: Diagnosis not present

## 2017-10-23 DIAGNOSIS — E119 Type 2 diabetes mellitus without complications: Secondary | ICD-10-CM | POA: Diagnosis not present

## 2017-10-23 DIAGNOSIS — N183 Chronic kidney disease, stage 3 (moderate): Secondary | ICD-10-CM | POA: Diagnosis not present

## 2017-10-23 DIAGNOSIS — Z1211 Encounter for screening for malignant neoplasm of colon: Secondary | ICD-10-CM | POA: Diagnosis not present

## 2017-10-23 DIAGNOSIS — Z0000 Encounter for general adult medical examination without abnormal findings: Secondary | ICD-10-CM | POA: Diagnosis not present

## 2017-10-23 DIAGNOSIS — I739 Peripheral vascular disease, unspecified: Secondary | ICD-10-CM | POA: Diagnosis not present

## 2017-10-23 DIAGNOSIS — Z794 Long term (current) use of insulin: Secondary | ICD-10-CM | POA: Diagnosis not present

## 2017-10-23 DIAGNOSIS — Z125 Encounter for screening for malignant neoplasm of prostate: Secondary | ICD-10-CM | POA: Diagnosis not present

## 2017-10-26 ENCOUNTER — Encounter (INDEPENDENT_AMBULATORY_CARE_PROVIDER_SITE_OTHER): Payer: Self-pay | Admitting: Orthopedic Surgery

## 2017-10-26 ENCOUNTER — Ambulatory Visit (INDEPENDENT_AMBULATORY_CARE_PROVIDER_SITE_OTHER): Payer: Medicare Other | Admitting: Orthopedic Surgery

## 2017-10-26 VITALS — Ht 74.0 in | Wt 222.0 lb

## 2017-10-26 DIAGNOSIS — L97421 Non-pressure chronic ulcer of left heel and midfoot limited to breakdown of skin: Secondary | ICD-10-CM

## 2017-10-26 DIAGNOSIS — Z89432 Acquired absence of left foot: Secondary | ICD-10-CM

## 2017-10-26 DIAGNOSIS — E11621 Type 2 diabetes mellitus with foot ulcer: Secondary | ICD-10-CM

## 2017-10-26 NOTE — Progress Notes (Signed)
Office Visit Note   Patient: Kevin Bradford           Date of Birth: [birth year]           MRN: [id number] Visit Date: 10/26/2017              Requested by: Georgann HousekeeperHusain, Karrar, MD [address] [place], [place] [address] PCP: Georgann HousekeeperHusain, Karrar, MD  Chief Complaint  Patient presents with  . Left Foot - Follow-up    08/16/17 left foot revision transmet amputation       HPI: Patient is a 72 year old gentleman status post left transmetatarsal amputation with a Waggoner grade 1 ulcer plantar aspect left foot.  Previously we have placed a felt relieving donut in his shoe he does have an appointment with [facility] next week for orthotics and shoes.  Assessment & Plan: Visit Diagnoses:  1. S/P transmetatarsal amputation of foot, left (HCC)   2. Diabetic ulcer of left midfoot associated with type 2 diabetes mellitus, limited to breakdown of skin (HCC)     Plan: Recommend wearing the medical compression stocking directly against the wound.  There is no drainage no cellulitis the wound is flat there is no signs of infection.  He will noted the sock around the clock will give him extra felt relieving donut pressure from the ulcer.  He will call immediately if there is any changes in his condition.  Follow-Up Instructions: Return in about 2 weeks (around 11/09/2017).   Ortho Exam  Patient is alert, oriented, no adenopathy, well-dressed, normal affect, normal respiratory effort. Examination patient's foot is plantigrade there is no cellulitis no drainage no signs of infection.  The wound has essentially 100% granulation tissue it is 10 x 20 mm and 2 mm deep there is no signs of infection.  Patient has no redness no cellulitis no swelling.  There is some dry cracked skin that was removed.  Imaging: No results found. No images are attached to the encounter.  Labs: Lab Results  Component Value Date   HGBA1C 9.3 (H) 07/19/2017   HGBA1C 11.3 (H) 10/14/2014   HGBA1C 10.6 (H) 10/13/2014   ESRSEDRATE 119 (H) 10/18/2014   ESRSEDRATE 115 (H) 10/13/2014   CRP 8.5 (H) 10/18/2014   CRP 21.9 (H) 10/13/2014   LABURIC 3.3 (L) 10/14/2014   REPTSTATUS 07/24/2017 FINAL 07/19/2017   GRAMSTAIN  07/19/2017    FEW WBC PRESENT, PREDOMINANTLY MONONUCLEAR MODERATE GRAM POSITIVE COCCI IN PAIRS MODERATE GRAM NEGATIVE RODS    CULT  07/19/2017    MODERATE ENTEROCOCCUS FAECALIS FEW METHICILLIN RESISTANT STAPHYLOCOCCUS AUREUS MODERATE BACTEROIDES ORALIS BETA LACTAMASE POSITIVE    LABORGA METHICILLIN RESISTANT STAPHYLOCOCCUS AUREUS 07/19/2017   LABORGA ENTEROCOCCUS FAECALIS 07/19/2017    @LABSALLVALUES (HGBA1)@  Body mass index is 28.5 kg/m.  Orders:  No orders of the defined types were placed in this encounter.  No orders of the defined types were placed in this encounter.    Procedures: No procedures performed  Clinical Data: No additional findings.  ROS:  All other systems negative, except as noted in the HPI. Review of Systems  Objective: Vital Signs: Ht 6\' 2"  (1.88 m)   Wt 222 lb (100.7 kg)   BMI 28.50 kg/m   Specialty Comments:  No specialty comments available.  PMFS History: Patient Active Problem List   Diagnosis Date Noted  . Dehiscence of amputation stump (HCC) 08/11/2017  . S/P transmetatarsal amputation of foot, left (HCC) 07/19/2017  . Subacute osteomyelitis, left ankle and foot (HCC) 07/17/2017  .  Diabetic ulcer of left foot associated with type 2 diabetes mellitus (HCC)   . Diabetes mellitus with renal complications (HCC) 10/17/2014  . Diabetes (HCC) 10/13/2014  . Anemia 10/13/2014  . CKD (chronic kidney disease) stage 3, GFR 30-59 ml/min (HCC) 10/13/2014  . Hyperkalemia 10/13/2014   Past Medical History:  Diagnosis Date  . Arthritis    "joint pain" (08/16/2017)  . Cellulitis and abscess of leg 10/18/2014  . CKD (chronic kidney disease) stage 3, GFR 30-59 ml/min (HCC)    creatinine increases with antibiotics per pt, no known kidney disease per  patient  . Dehiscence of closure of skin, subsequent encounter   . Diabetic foot ulcer (HCC)    left  . Diabetic peripheral neuropathy (HCC)   . GERD (gastroesophageal reflux disease)   . Type II diabetes mellitus (HCC)     Family History  Problem Relation Age of Onset  . Dementia Mother     Past Surgical History:  Procedure Laterality Date  . AMPUTATION Left 07/19/2017   Procedure: LEFT TRANSMETATARSAL AMPUTATION;  Surgeon: Nadara Mustard, MD;  Location: [facility];  Service: Orthopedics;  Laterality: Left;  . AMPUTATION Left 08/16/2017   REVISION LEFT TRANSMETATARSAL AMPUTATION  . CATARACT EXTRACTION W/ INTRAOCULAR LENS  IMPLANT, BILATERAL  ~ 2016  . I&D EXTREMITY Left 10/17/2014   Procedure: IRRIGATION AND DEBRIDEMENT EXTREMITY LEFT FOOT;  Surgeon: Nadara Mustard, MD;  Location: [facility] OR;  Service: Orthopedics;  Laterality: Left;  . STUMP REVISION Left 08/16/2017   Procedure: REVISION LEFT TRANSMETATARSAL AMPUTATION;  Surgeon: Nadara Mustard, MD;  Location: [facility] OR;  Service: Orthopedics;  Laterality: Left;  . TOE AMPUTATION Right 2013   5th toe   Social History   Occupational History  . Not on file  Tobacco Use  . Smoking status: Never Smoker  . Smokeless tobacco: Never Used  Substance and Sexual Activity  . Alcohol use: Yes    Comment: 08/16/2017 "maybe 1 beer 2 times a month"  . Drug use: No  . Sexual activity: Yes

## 2017-11-09 ENCOUNTER — Ambulatory Visit (INDEPENDENT_AMBULATORY_CARE_PROVIDER_SITE_OTHER): Payer: Medicare Other | Admitting: Orthopedic Surgery

## 2017-11-09 ENCOUNTER — Encounter (INDEPENDENT_AMBULATORY_CARE_PROVIDER_SITE_OTHER): Payer: Self-pay | Admitting: Orthopedic Surgery

## 2017-11-09 VITALS — Ht 74.0 in | Wt 222.0 lb

## 2017-11-09 DIAGNOSIS — E11621 Type 2 diabetes mellitus with foot ulcer: Secondary | ICD-10-CM

## 2017-11-09 DIAGNOSIS — Z89432 Acquired absence of left foot: Secondary | ICD-10-CM

## 2017-11-09 DIAGNOSIS — L97421 Non-pressure chronic ulcer of left heel and midfoot limited to breakdown of skin: Secondary | ICD-10-CM

## 2017-11-09 NOTE — Progress Notes (Signed)
Office Visit Note   Patient: Kevin Bradford           Date of Birth: 04/14/1946           MRN: [id number] Visit Date: 11/09/2017              Requested by: Georgann HousekeeperHusain, Karrar, MD [address] [place], [place] [id number] PCP: Georgann HousekeeperHusain, Karrar, MD  Chief Complaint  Patient presents with  . Left Foot - Follow-up    08/16/17 revision left transmet amputation       HPI: Patient is a 72 year old gentleman who presents 4 months status post revision transmetatarsal imitation left foot.  He has a Designer, fashion/clothingWagener grade 1 ulcer beneath the lateral column.  He has new good extra-depth shoes custom orthotics carbon fiber plate.  He is using a nitroglycerin patch dorsally and using Trental 3 times a day.  Assessment & Plan: Visit Diagnoses:  1. S/P transmetatarsal amputation of foot, left (HCC)   2. Diabetic ulcer of left midfoot associated with type 2 diabetes mellitus, limited to breakdown of skin (HCC)     Plan: Recommended using the nitroglycerin patch now over the posterior tibial artery that should get the medial lateral branches of th plantar artery.  Continue with the Trental minimize weightbearing continue with compression stocking.  Follow-Up Instructions: Return in about 1 month (around 12/07/2017).   Ortho Exam  Patient is alert, oriented, no adenopathy, well-dressed, normal affect, normal respiratory effort. Examination the ulcer has good granulation tissue at its 15 mm in diameter 2 mm deep.  After debridement there is good bleeding healthy tissue around the wound edges this was touched with silver nitrate Iodosorb 4 x 4 and tape was applied he will resume his compression sock and shoe.  Imaging: No results found. No images are attached to the encounter.  Labs: Lab Results  Component Value Date   HGBA1C 9.3 (H) 07/19/2017   HGBA1C 11.3 (H) 10/14/2014   HGBA1C 10.6 (H) 10/13/2014   ESRSEDRATE 119 (H) 10/18/2014   ESRSEDRATE 115 (H) 10/13/2014   CRP 8.5 (H) 10/18/2014   CRP 21.9 (H) 10/13/2014   LABURIC 3.3 (L) 10/14/2014   REPTSTATUS 07/24/2017 FINAL 07/19/2017   GRAMSTAIN  07/19/2017    FEW WBC PRESENT, PREDOMINANTLY MONONUCLEAR MODERATE GRAM POSITIVE COCCI IN PAIRS MODERATE GRAM NEGATIVE RODS    CULT  07/19/2017    MODERATE ENTEROCOCCUS FAECALIS FEW METHICILLIN RESISTANT STAPHYLOCOCCUS AUREUS MODERATE BACTEROIDES ORALIS BETA LACTAMASE POSITIVE    LABORGA METHICILLIN RESISTANT STAPHYLOCOCCUS AUREUS 07/19/2017   LABORGA ENTEROCOCCUS FAECALIS 07/19/2017    @LABSALLVALUES (HGBA1)@  Body mass index is 28.5 kg/m.  Orders:  No orders of the defined types were placed in this encounter.  No orders of the defined types were placed in this encounter.    Procedures: No procedures performed  Clinical Data: No additional findings.  ROS:  All other systems negative, except as noted in the HPI. Review of Systems  Objective: Vital Signs: Ht 6\' 2"  (1.88 m)   Wt 222 lb (100.7 kg)   BMI 28.50 kg/m   Specialty Comments:  No specialty comments available.  PMFS History: Patient Active Problem List   Diagnosis Date Noted  . Dehiscence of amputation stump (HCC) 08/11/2017  . S/P transmetatarsal amputation of foot, left (HCC) 07/19/2017  . Subacute osteomyelitis, left ankle and foot (HCC) 07/17/2017  . Diabetic ulcer of left foot associated with type 2 diabetes mellitus (HCC)   . Diabetes mellitus with renal complications (HCC) 10/17/2014  .  Diabetes (HCC) 10/13/2014  . Anemia 10/13/2014  . CKD (chronic kidney disease) stage 3, GFR 30-59 ml/min (HCC) 10/13/2014  . Hyperkalemia 10/13/2014   Past Medical History:  Diagnosis Date  . Arthritis    "joint pain" (08/16/2017)  . Cellulitis and abscess of leg 10/18/2014  . CKD (chronic kidney disease) stage 3, GFR 30-59 ml/min (HCC)    creatinine increases with antibiotics per pt, no known kidney disease per patient  . Dehiscence of closure of skin, subsequent encounter   . Diabetic foot ulcer  (HCC)    left  . Diabetic peripheral neuropathy (HCC)   . GERD (gastroesophageal reflux disease)   . Type II diabetes mellitus (HCC)     Family History  Problem Relation Age of Onset  . Dementia Mother     Past Surgical History:  Procedure Laterality Date  . AMPUTATION Left 07/19/2017   Procedure: LEFT TRANSMETATARSAL AMPUTATION;  Surgeon: Nadara Mustard, MD;  Location: [facility] OR;  Service: Orthopedics;  Laterality: Left;  . AMPUTATION Left 08/16/2017   REVISION LEFT TRANSMETATARSAL AMPUTATION  . CATARACT EXTRACTION W/ INTRAOCULAR LENS  IMPLANT, BILATERAL  ~ 2016  . I&D EXTREMITY Left 10/17/2014   Procedure: IRRIGATION AND DEBRIDEMENT EXTREMITY LEFT FOOT;  Surgeon: Nadara Mustard, MD;  Location: [facility] OR;  Service: Orthopedics;  Laterality: Left;  . STUMP REVISION Left 08/16/2017   Procedure: REVISION LEFT TRANSMETATARSAL AMPUTATION;  Surgeon: Nadara Mustard, MD;  Location[facility] OR;  Service: Orthopedics;  Laterality: Left;  . TOE AMPUTATION Right 2013   5th toe   Social History   Occupational History  . Not on file  Tobacco Use  . Smoking status: Never Smoker  . Smokeless tobacco: Never Used  Substance and Sexual Activity  . Alcohol use: Yes    Comment: 08/16/2017 "maybe 1 beer 2 times a month"  . Drug use: No  . Sexual activity: Yes

## 2017-11-21 ENCOUNTER — Other Ambulatory Visit: Payer: Self-pay

## 2017-11-21 ENCOUNTER — Ambulatory Visit (INDEPENDENT_AMBULATORY_CARE_PROVIDER_SITE_OTHER): Payer: Medicare Other | Admitting: Orthopedic Surgery

## 2017-11-21 ENCOUNTER — Encounter (HOSPITAL_COMMUNITY): Payer: Self-pay | Admitting: *Deleted

## 2017-11-21 ENCOUNTER — Other Ambulatory Visit (INDEPENDENT_AMBULATORY_CARE_PROVIDER_SITE_OTHER): Payer: Self-pay | Admitting: Orthopedic Surgery

## 2017-11-21 ENCOUNTER — Encounter (INDEPENDENT_AMBULATORY_CARE_PROVIDER_SITE_OTHER): Payer: Self-pay | Admitting: Orthopedic Surgery

## 2017-11-21 DIAGNOSIS — L97421 Non-pressure chronic ulcer of left heel and midfoot limited to breakdown of skin: Secondary | ICD-10-CM

## 2017-11-21 DIAGNOSIS — Z89432 Acquired absence of left foot: Secondary | ICD-10-CM

## 2017-11-21 DIAGNOSIS — E11621 Type 2 diabetes mellitus with foot ulcer: Secondary | ICD-10-CM

## 2017-11-21 DIAGNOSIS — M869 Osteomyelitis, unspecified: Secondary | ICD-10-CM

## 2017-11-21 NOTE — Progress Notes (Signed)
Office Visit Note   Patient: Kevin Bradford           Date of Birth: Mar 30, 1946           MRN: [id number] Visit Date: 11/21/2017              Requested by: Georgann Housekeeper, MD [address] [place], [place] [address] PCP: Georgann Housekeeper, MD  Chief Complaint  Patient presents with  . Left Foot - Wound Check      HPI: Patient is a 72 year old gentleman status post transmetatarsal amputation on the left.  Patient has had an ulcer which has gotten larger deeper with more necrotic tissue over the fifth metatarsal.  He is undergoing conservative care with nitroglycerin patches Trental pressure unloading dressing changes with an ulcer that has gotten progressively deeper and larger.  Assessment & Plan: Visit Diagnoses:  1. S/P transmetatarsal amputation of foot, left (HCC)   2. Diabetic ulcer of left midfoot associated with type 2 diabetes mellitus, limited to breakdown of skin (HCC)     Plan: Discussed with the patient his options are continued with foot salvage intervention with continued surgeries versus a transtibial amputation.  Patient states that he feels there is increased stress with the additional surgeries and would like to proceed with a transtibial amputation at this time.  We will plan for [employer] to deliver the stump shrinker and limb protector to the hospital.  Will plan on inpatient rehabilitation postoperatively.  Follow-Up Instructions: Return in about 2 weeks (around 12/05/2017).   Ortho Exam  Patient is alert, oriented, no adenopathy, well-dressed, normal affect, normal respiratory effort. Examination patient has no ascending cellulitis.  The surgical incision is healed well but the ulcer beneath the fifth metatarsal has gotten larger.  The wound is 2 cm in diameter and 2 cm deep this surrounds the fifth metatarsal the tissue in the wound bed and around the metatarsal is necrotic.  Imaging: No results found. No images are attached to the  encounter.  Labs: Lab Results  Component Value Date   HGBA1C 9.3 (H) 07/19/2017   HGBA1C 11.3 (H) 10/14/2014   HGBA1C 10.6 (H) 10/13/2014   ESRSEDRATE 119 (H) 10/18/2014   ESRSEDRATE 115 (H) 10/13/2014   CRP 8.5 (H) 10/18/2014   CRP 21.9 (H) 10/13/2014   LABURIC 3.3 (L) 10/14/2014   REPTSTATUS 07/24/2017 FINAL 07/19/2017   GRAMSTAIN  07/19/2017    FEW WBC PRESENT, PREDOMINANTLY MONONUCLEAR MODERATE GRAM POSITIVE COCCI IN PAIRS MODERATE GRAM NEGATIVE RODS    CULT  07/19/2017    MODERATE ENTEROCOCCUS FAECALIS FEW METHICILLIN RESISTANT STAPHYLOCOCCUS AUREUS MODERATE BACTEROIDES ORALIS BETA LACTAMASE POSITIVE    LABORGA METHICILLIN RESISTANT STAPHYLOCOCCUS AUREUS 07/19/2017   LABORGA ENTEROCOCCUS FAECALIS 07/19/2017    Lab Results  Component Value Date/Time   HGBA1C 9.3 (H) 07/19/2017 11:30 AM   HGBA1C 11.3 (H) 10/14/2014 07:11 PM   HGBA1C 10.6 (H) 10/13/2014 09:50 PM    There is no height or weight on file to calculate BMI.  Orders:  No orders of the defined types were placed in this encounter.  No orders of the defined types were placed in this encounter.    Procedures: No procedures performed  Clinical Data: No additional findings.  ROS:  All other systems negative, except as noted in the HPI. Review of Systems  Objective: Vital Signs: There were no vitals taken for this visit.  Specialty Comments:  No specialty comments available.  PMFS History: Patient Active Problem List   Diagnosis  Date Noted  . Dehiscence of amputation stump (HCC) 08/11/2017  . S/P transmetatarsal amputation of foot, left (HCC) 07/19/2017  . Subacute osteomyelitis, left ankle and foot (HCC) 07/17/2017  . Diabetic ulcer of left foot associated with type 2 diabetes mellitus (HCC)   . Diabetes mellitus with renal complications (HCC) 10/17/2014  . Diabetes (HCC) 10/13/2014  . Anemia 10/13/2014  . CKD (chronic kidney disease) stage 3, GFR 30-59 ml/min (HCC) 10/13/2014  .  Hyperkalemia 10/13/2014   Past Medical History:  Diagnosis Date  . Arthritis    "joint pain" (08/16/2017)  . Cellulitis and abscess of leg 10/18/2014  . CKD (chronic kidney disease) stage 3, GFR 30-59 ml/min (HCC)    creatinine increases with antibiotics per pt, no known kidney disease per patient  . Dehiscence of closure of skin, subsequent encounter   . Diabetic foot ulcer (HCC)    left  . Diabetic peripheral neuropathy (HCC)   . GERD (gastroesophageal reflux disease)   . Type II diabetes mellitus (HCC)     Family History  Problem Relation Age of Onset  . Dementia Mother     Past Surgical History:  Procedure Laterality Date  . AMPUTATION Left 07/19/2017   Procedure: LEFT TRANSMETATARSAL AMPUTATION;  Surgeon: Nadara Mustard, MD;  Location: [facility] OR;  Service: Orthopedics;  Laterality: Left;  . AMPUTATION Left 08/16/2017   REVISION LEFT TRANSMETATARSAL AMPUTATION  . CATARACT EXTRACTION W/ INTRAOCULAR LENS  IMPLANT, BILATERAL  ~ 2016  . I&D EXTREMITY Left 10/17/2014   Procedure: IRRIGATION AND DEBRIDEMENT EXTREMITY LEFT FOOT;  Surgeon: Nadara Mustard, MD;  Location: [facility] OR;  Service: Orthopedics;  Laterality: Left;  . STUMP REVISION Left 08/16/2017   Procedure: REVISION LEFT TRANSMETATARSAL AMPUTATION;  Surgeon: Nadara Mustard, MD;  Location: [facility] OR;  Service: Orthopedics;  Laterality: Left;  . TOE AMPUTATION Right 2013   5th toe   Social History   Occupational History  . Not on file  Tobacco Use  . Smoking status: Never Smoker  . Smokeless tobacco: Never Used  Substance and Sexual Activity  . Alcohol use: Yes    Comment: 08/16/2017 "maybe 1 beer 2 times a month"  . Drug use: No  . Sexual activity: Yes

## 2017-11-21 NOTE — Progress Notes (Signed)
Pt denies SOB, chest pain, and being under the care of a cardiologist. Pt denies having a stress test, echo and cardiac cath. Pt denies recent labs. Pt made aware to stop taking vitamins, fish oil and herbal medications. Do not take any NSAIDs ie: Ibuprofen, Advil, Naproxen (Aleve), Motrin, BC and Goody Powder.Pt made aware to take 20 units of Levemir insulin tonight and no Metformin DOS.Pt made aware to check BG every 2 hours prior to arrival to hospital on DOS. Pt made aware to treat a BG < 70 with 4 ounces of apple juice, wait 15 minutes after intervention to recheck BG, if BG remains < 70, call Short Stay unit to speak with a nurse. Pt verbalized understanding of all pre-op instructions.

## 2017-11-22 ENCOUNTER — Inpatient Hospital Stay (HOSPITAL_COMMUNITY): Payer: Medicare Other | Admitting: Anesthesiology

## 2017-11-22 ENCOUNTER — Inpatient Hospital Stay (HOSPITAL_COMMUNITY)
Admission: RE | Admit: 2017-11-22 | Discharge: 2017-11-24 | DRG: 475 | Disposition: A | Discharge status: Rehabilitation Facility | Payer: Medicare Other | Source: Ambulatory Visit | Attending: Orthopedic Surgery | Admitting: Orthopedic Surgery

## 2017-11-22 ENCOUNTER — Encounter (HOSPITAL_COMMUNITY)
Admission: RE | Disposition: A | Discharge status: Rehabilitation Facility | Payer: Self-pay | Source: Ambulatory Visit | Attending: Orthopedic Surgery

## 2017-11-22 ENCOUNTER — Encounter (HOSPITAL_COMMUNITY): Payer: Self-pay | Admitting: *Deleted

## 2017-11-22 DIAGNOSIS — L97524 Non-pressure chronic ulcer of other part of left foot with necrosis of bone: Secondary | ICD-10-CM | POA: Diagnosis not present

## 2017-11-22 DIAGNOSIS — Z89432 Acquired absence of left foot: Secondary | ICD-10-CM

## 2017-11-22 DIAGNOSIS — M86272 Subacute osteomyelitis, left ankle and foot: Secondary | ICD-10-CM | POA: Diagnosis present

## 2017-11-22 DIAGNOSIS — E119 Type 2 diabetes mellitus without complications: Secondary | ICD-10-CM | POA: Diagnosis not present

## 2017-11-22 DIAGNOSIS — M869 Osteomyelitis, unspecified: Secondary | ICD-10-CM

## 2017-11-22 DIAGNOSIS — Z961 Presence of intraocular lens: Secondary | ICD-10-CM | POA: Diagnosis present

## 2017-11-22 DIAGNOSIS — Z794 Long term (current) use of insulin: Secondary | ICD-10-CM

## 2017-11-22 DIAGNOSIS — E1169 Type 2 diabetes mellitus with other specified complication: Secondary | ICD-10-CM | POA: Diagnosis present

## 2017-11-22 DIAGNOSIS — S88112A Complete traumatic amputation at level between knee and ankle, left lower leg, initial encounter: Secondary | ICD-10-CM

## 2017-11-22 DIAGNOSIS — E1142 Type 2 diabetes mellitus with diabetic polyneuropathy: Secondary | ICD-10-CM | POA: Diagnosis present

## 2017-11-22 DIAGNOSIS — Y835 Amputation of limb(s) as the cause of abnormal reaction of the patient, or of later complication, without mention of misadventure at the time of the procedure: Secondary | ICD-10-CM | POA: Diagnosis not present

## 2017-11-22 DIAGNOSIS — E1152 Type 2 diabetes mellitus with diabetic peripheral angiopathy with gangrene: Secondary | ICD-10-CM | POA: Diagnosis present

## 2017-11-22 DIAGNOSIS — I70262 Atherosclerosis of native arteries of extremities with gangrene, left leg: Secondary | ICD-10-CM | POA: Diagnosis not present

## 2017-11-22 DIAGNOSIS — D62 Acute posthemorrhagic anemia: Secondary | ICD-10-CM | POA: Diagnosis not present

## 2017-11-22 DIAGNOSIS — Z885 Allergy status to narcotic agent status: Secondary | ICD-10-CM | POA: Diagnosis not present

## 2017-11-22 DIAGNOSIS — N183 Chronic kidney disease, stage 3 (moderate): Secondary | ICD-10-CM | POA: Diagnosis present

## 2017-11-22 DIAGNOSIS — T8754 Necrosis of amputation stump, left lower extremity: Secondary | ICD-10-CM | POA: Diagnosis not present

## 2017-11-22 DIAGNOSIS — E1151 Type 2 diabetes mellitus with diabetic peripheral angiopathy without gangrene: Secondary | ICD-10-CM | POA: Diagnosis present

## 2017-11-22 DIAGNOSIS — E875 Hyperkalemia: Secondary | ICD-10-CM | POA: Diagnosis not present

## 2017-11-22 DIAGNOSIS — Z9842 Cataract extraction status, left eye: Secondary | ICD-10-CM | POA: Diagnosis not present

## 2017-11-22 DIAGNOSIS — Z9841 Cataract extraction status, right eye: Secondary | ICD-10-CM | POA: Diagnosis not present

## 2017-11-22 DIAGNOSIS — R7309 Other abnormal glucose: Secondary | ICD-10-CM | POA: Diagnosis not present

## 2017-11-22 DIAGNOSIS — E1122 Type 2 diabetes mellitus with diabetic chronic kidney disease: Secondary | ICD-10-CM | POA: Diagnosis present

## 2017-11-22 DIAGNOSIS — E11621 Type 2 diabetes mellitus with foot ulcer: Secondary | ICD-10-CM | POA: Diagnosis present

## 2017-11-22 DIAGNOSIS — T8781 Dehiscence of amputation stump: Principal | ICD-10-CM | POA: Diagnosis present

## 2017-11-22 DIAGNOSIS — M86172 Other acute osteomyelitis, left ankle and foot: Secondary | ICD-10-CM

## 2017-11-22 DIAGNOSIS — L97529 Non-pressure chronic ulcer of other part of left foot with unspecified severity: Secondary | ICD-10-CM | POA: Diagnosis not present

## 2017-11-22 DIAGNOSIS — Z79899 Other long term (current) drug therapy: Secondary | ICD-10-CM

## 2017-11-22 DIAGNOSIS — Z89512 Acquired absence of left leg below knee: Secondary | ICD-10-CM | POA: Diagnosis not present

## 2017-11-22 DIAGNOSIS — K5900 Constipation, unspecified: Secondary | ICD-10-CM | POA: Diagnosis present

## 2017-11-22 DIAGNOSIS — K219 Gastro-esophageal reflux disease without esophagitis: Secondary | ICD-10-CM | POA: Diagnosis present

## 2017-11-22 DIAGNOSIS — I739 Peripheral vascular disease, unspecified: Secondary | ICD-10-CM | POA: Diagnosis not present

## 2017-11-22 DIAGNOSIS — R0989 Other specified symptoms and signs involving the circulatory and respiratory systems: Secondary | ICD-10-CM | POA: Diagnosis not present

## 2017-11-22 DIAGNOSIS — G8918 Other acute postprocedural pain: Secondary | ICD-10-CM | POA: Diagnosis not present

## 2017-11-22 DIAGNOSIS — Z4781 Encounter for orthopedic aftercare following surgical amputation: Secondary | ICD-10-CM | POA: Diagnosis not present

## 2017-11-22 HISTORY — PX: AMPUTATION: SHX166

## 2017-11-22 HISTORY — DX: Osteomyelitis, unspecified: M86.9

## 2017-11-22 LAB — GLUCOSE, CAPILLARY
GLUCOSE-CAPILLARY: 133 mg/dL — AB (ref 65–99)
Glucose-Capillary: 148 mg/dL — ABNORMAL HIGH (ref 65–99)
Glucose-Capillary: 188 mg/dL — ABNORMAL HIGH (ref 65–99)

## 2017-11-22 LAB — CBC
HCT: 37.4 % — ABNORMAL LOW (ref 39.0–52.0)
HEMOGLOBIN: 12.2 g/dL — AB (ref 13.0–17.0)
MCH: 29.5 pg (ref 26.0–34.0)
MCHC: 32.6 g/dL (ref 30.0–36.0)
MCV: 90.3 fL (ref 78.0–100.0)
Platelets: 216 10*3/uL (ref 150–400)
RBC: 4.14 MIL/uL — ABNORMAL LOW (ref 4.22–5.81)
RDW: 12.7 % (ref 11.5–15.5)
WBC: 8.1 10*3/uL (ref 4.0–10.5)

## 2017-11-22 LAB — HEMOGLOBIN A1C
HEMOGLOBIN A1C: 6.7 % — AB (ref 4.8–5.6)
Mean Plasma Glucose: 145.59 mg/dL

## 2017-11-22 LAB — SURGICAL PCR SCREEN
MRSA, PCR: NEGATIVE
STAPHYLOCOCCUS AUREUS: NEGATIVE

## 2017-11-22 LAB — BASIC METABOLIC PANEL
ANION GAP: 9 (ref 5–15)
BUN: 20 mg/dL (ref 6–20)
CALCIUM: 8.8 mg/dL — AB (ref 8.9–10.3)
CO2: 22 mmol/L (ref 22–32)
CREATININE: 1.28 mg/dL — AB (ref 0.61–1.24)
Chloride: 110 mmol/L (ref 101–111)
GFR calc Af Amer: >60 mL/min (ref >60)
GFR calc non Af Amer: 54 mL/min — ABNORMAL LOW (ref >60)
GLUCOSE: 153 mg/dL — AB (ref 65–99)
Potassium: 4.2 mmol/L (ref 3.5–5.1)
Sodium: 141 mmol/L (ref 135–145)

## 2017-11-22 SURGERY — AMPUTATION BELOW KNEE
Anesthesia: General | Laterality: Left

## 2017-11-22 MED ORDER — CEFAZOLIN SODIUM-DEXTROSE 2-4 GM/100ML-% IV SOLN
2.0000 g | Freq: Four times a day (QID) | INTRAVENOUS | Status: AC
  Administered 2017-11-22 – 2017-11-23 (×2): 2 g via INTRAVENOUS
  Filled 2017-11-22 (×2): qty 100

## 2017-11-22 MED ORDER — PROPOFOL 10 MG/ML IV BOLUS
INTRAVENOUS | Status: DC | PRN
  Administered 2017-11-22 (×2): 100 mg via INTRAVENOUS

## 2017-11-22 MED ORDER — MAGNESIUM CITRATE PO SOLN: 1.0000 | Freq: Once | ORAL | Status: DC | PRN

## 2017-11-22 MED ORDER — METOCLOPRAMIDE HCL 5 MG/ML IJ SOLN: 5.0000 mg | Freq: Three times a day (TID) | INTRAMUSCULAR | Status: DC | PRN

## 2017-11-22 MED ORDER — METOCLOPRAMIDE HCL 5 MG PO TABS: 5.0000 mg | ORAL_TABLET | Freq: Three times a day (TID) | ORAL | Status: DC | PRN

## 2017-11-22 MED ORDER — INSULIN ASPART 100 UNIT/ML ~~LOC~~ SOLN
4.0000 [IU] | Freq: Three times a day (TID) | SUBCUTANEOUS | Status: DC
  Administered 2017-11-22 – 2017-11-24 (×6): 4 [IU] via SUBCUTANEOUS

## 2017-11-22 MED ORDER — CEFAZOLIN SODIUM-DEXTROSE 2-4 GM/100ML-% IV SOLN
2.0000 g | Freq: Four times a day (QID) | INTRAVENOUS | Status: DC
  Filled 2017-11-22 (×2): qty 100

## 2017-11-22 MED ORDER — FENTANYL CITRATE (PF) 250 MCG/5ML IJ SOLN
INTRAMUSCULAR | Status: DC | PRN
  Administered 2017-11-22 (×3): 50 ug via INTRAVENOUS
  Administered 2017-11-22 (×2): 100 ug via INTRAVENOUS
  Administered 2017-11-22: 50 ug via INTRAVENOUS

## 2017-11-22 MED ORDER — ONDANSETRON HCL 4 MG PO TABS: 4.0000 mg | ORAL_TABLET | Freq: Four times a day (QID) | ORAL | Status: DC | PRN

## 2017-11-22 MED ORDER — HYDROCODONE-ACETAMINOPHEN 7.5-325 MG PO TABS
1.0000 | ORAL_TABLET | ORAL | Status: DC | PRN
  Administered 2017-11-22 – 2017-11-24 (×5): 2 via ORAL
  Filled 2017-11-22 (×5): qty 2

## 2017-11-22 MED ORDER — PENTOXIFYLLINE ER 400 MG PO TBCR
400.0000 mg | EXTENDED_RELEASE_TABLET | Freq: Three times a day (TID) | ORAL | Status: DC
  Administered 2017-11-22 – 2017-11-24 (×6): 400 mg via ORAL
  Filled 2017-11-22 (×7): qty 1

## 2017-11-22 MED ORDER — LIDOCAINE 2% (20 MG/ML) 5 ML SYRINGE
INTRAMUSCULAR | Status: DC | PRN
  Administered 2017-11-22: 60 mg via INTRAVENOUS

## 2017-11-22 MED ORDER — ONDANSETRON HCL 4 MG/2ML IJ SOLN
4.0000 mg | Freq: Four times a day (QID) | INTRAMUSCULAR | Status: DC | PRN
  Administered 2017-11-22: 4 mg via INTRAVENOUS
  Filled 2017-11-22 (×2): qty 2

## 2017-11-22 MED ORDER — LACTATED RINGERS IV SOLN
INTRAVENOUS | Status: DC
  Administered 2017-11-22: 13:00:00 via INTRAVENOUS

## 2017-11-22 MED ORDER — 0.9 % SODIUM CHLORIDE (POUR BTL) OPTIME
TOPICAL | Status: DC | PRN
  Administered 2017-11-22: 1000 mL

## 2017-11-22 MED ORDER — ACETAMINOPHEN 325 MG PO TABS: 325.0000 mg | ORAL_TABLET | Freq: Four times a day (QID) | ORAL | Status: DC | PRN

## 2017-11-22 MED ORDER — HYDROCODONE-ACETAMINOPHEN 5-325 MG PO TABS
1.0000 | ORAL_TABLET | ORAL | Status: DC | PRN
  Administered 2017-11-22 – 2017-11-23 (×4): 2 via ORAL
  Filled 2017-11-22 (×4): qty 2

## 2017-11-22 MED ORDER — PROPOFOL 10 MG/ML IV BOLUS
INTRAVENOUS | Status: AC
  Filled 2017-11-22: qty 20

## 2017-11-22 MED ORDER — ASPIRIN EC 325 MG PO TBEC
325.0000 mg | DELAYED_RELEASE_TABLET | Freq: Every day | ORAL | Status: DC
  Administered 2017-11-22 – 2017-11-24 (×3): 325 mg via ORAL
  Filled 2017-11-22 (×3): qty 1

## 2017-11-22 MED ORDER — DEXTROSE 5 % IV SOLN
500.0000 mg | Freq: Four times a day (QID) | INTRAVENOUS | Status: DC | PRN
  Filled 2017-11-22: qty 5

## 2017-11-22 MED ORDER — BISACODYL 10 MG RE SUPP: 10.0000 mg | Freq: Every day | RECTAL | Status: DC | PRN

## 2017-11-22 MED ORDER — DEXAMETHASONE SODIUM PHOSPHATE 10 MG/ML IJ SOLN
INTRAMUSCULAR | Status: AC
  Filled 2017-11-22: qty 1

## 2017-11-22 MED ORDER — FENTANYL CITRATE (PF) 250 MCG/5ML IJ SOLN
INTRAMUSCULAR | Status: AC
  Filled 2017-11-22: qty 5

## 2017-11-22 MED ORDER — METFORMIN HCL 500 MG PO TABS
500.0000 mg | ORAL_TABLET | Freq: Two times a day (BID) | ORAL | Status: DC
  Administered 2017-11-22 – 2017-11-24 (×4): 500 mg via ORAL
  Filled 2017-11-22 (×4): qty 1

## 2017-11-22 MED ORDER — SODIUM CHLORIDE 0.9 % IV SOLN
INTRAVENOUS | Status: DC
  Administered 2017-11-22: 17:00:00 via INTRAVENOUS

## 2017-11-22 MED ORDER — CHLORHEXIDINE GLUCONATE 4 % EX LIQD: 60.0000 mL | Freq: Once | CUTANEOUS | Status: DC

## 2017-11-22 MED ORDER — ONDANSETRON HCL 4 MG/2ML IJ SOLN
INTRAMUSCULAR | Status: AC
  Filled 2017-11-22: qty 2

## 2017-11-22 MED ORDER — METHOCARBAMOL 500 MG PO TABS
500.0000 mg | ORAL_TABLET | Freq: Four times a day (QID) | ORAL | Status: DC | PRN
  Administered 2017-11-22 – 2017-11-24 (×5): 500 mg via ORAL
  Filled 2017-11-22 (×5): qty 1

## 2017-11-22 MED ORDER — POLYETHYLENE GLYCOL 3350 17 G PO PACK: 17.0000 g | PACK | Freq: Every day | ORAL | Status: DC | PRN

## 2017-11-22 MED ORDER — ONDANSETRON HCL 4 MG/2ML IJ SOLN
INTRAMUSCULAR | Status: DC | PRN
  Administered 2017-11-22: 4 mg via INTRAVENOUS

## 2017-11-22 MED ORDER — PROMETHAZINE HCL 25 MG/ML IJ SOLN: 6.2500 mg | INTRAMUSCULAR | Status: DC | PRN

## 2017-11-22 MED ORDER — HYDROMORPHONE HCL 2 MG/ML IJ SOLN: 0.3000 mg | INTRAMUSCULAR | Status: DC | PRN

## 2017-11-22 MED ORDER — DEXAMETHASONE SODIUM PHOSPHATE 10 MG/ML IJ SOLN
INTRAMUSCULAR | Status: DC | PRN
  Administered 2017-11-22: 5 mg via INTRAVENOUS

## 2017-11-22 MED ORDER — MORPHINE SULFATE (PF) 2 MG/ML IV SOLN
0.5000 mg | INTRAVENOUS | Status: DC | PRN
  Administered 2017-11-22 (×2): 1 mg via INTRAVENOUS
  Filled 2017-11-22 (×2): qty 1

## 2017-11-22 MED ORDER — DOCUSATE SODIUM 100 MG PO CAPS
100.0000 mg | ORAL_CAPSULE | Freq: Two times a day (BID) | ORAL | Status: DC
  Administered 2017-11-22 – 2017-11-24 (×4): 100 mg via ORAL
  Filled 2017-11-22 (×4): qty 1

## 2017-11-22 MED ORDER — INSULIN DETEMIR 100 UNIT/ML ~~LOC~~ SOLN
30.0000 [IU] | Freq: Every day | SUBCUTANEOUS | Status: DC
  Administered 2017-11-22 – 2017-11-23 (×2): 30 [IU] via SUBCUTANEOUS
  Filled 2017-11-22 (×3): qty 0.3

## 2017-11-22 MED ORDER — CEFAZOLIN SODIUM-DEXTROSE 2-4 GM/100ML-% IV SOLN
2.0000 g | INTRAVENOUS | Status: AC
  Administered 2017-11-22: 2 g via INTRAVENOUS
  Filled 2017-11-22: qty 100

## 2017-11-22 MED ORDER — PANTOPRAZOLE SODIUM 40 MG PO TBEC
40.0000 mg | DELAYED_RELEASE_TABLET | Freq: Every day | ORAL | Status: DC
  Administered 2017-11-22 – 2017-11-24 (×3): 40 mg via ORAL
  Filled 2017-11-22 (×3): qty 1

## 2017-11-22 MED ORDER — INSULIN ASPART 100 UNIT/ML ~~LOC~~ SOLN
0.0000 [IU] | Freq: Three times a day (TID) | SUBCUTANEOUS | Status: DC
  Administered 2017-11-22: 3 [IU] via SUBCUTANEOUS
  Administered 2017-11-23 – 2017-11-24 (×3): 2 [IU] via SUBCUTANEOUS

## 2017-11-22 MED ORDER — LIDOCAINE 2% (20 MG/ML) 5 ML SYRINGE
INTRAMUSCULAR | Status: AC
  Filled 2017-11-22: qty 5

## 2017-11-22 SURGICAL SUPPLY — 37 items
BENZOIN TINCTURE PRP APPL 2/3 (GAUZE/BANDAGES/DRESSINGS) ×12 IMPLANT
BLADE SAW RECIP 87.9 MT (BLADE) ×3 IMPLANT
BLADE SURG 21 STRL SS (BLADE) ×3 IMPLANT
BNDG COHESIVE 6X5 TAN STRL LF (GAUZE/BANDAGES/DRESSINGS) ×6 IMPLANT
BNDG GAUZE ELAST 4 BULKY (GAUZE/BANDAGES/DRESSINGS) ×6 IMPLANT
COVER SURGICAL LIGHT HANDLE (MISCELLANEOUS) ×3 IMPLANT
CUFF TOURNIQUET SINGLE 34IN LL (TOURNIQUET CUFF) IMPLANT
CUFF TOURNIQUET SINGLE 44IN (TOURNIQUET CUFF) IMPLANT
DRAPE INCISE IOBAN 66X45 STRL (DRAPES) IMPLANT
DRAPE U-SHAPE 47X51 STRL (DRAPES) ×3 IMPLANT
DRESSING PREVENA PLUS CUSTOM (GAUZE/BANDAGES/DRESSINGS) ×2 IMPLANT
DRSG PREVENA PLUS CUSTOM (GAUZE/BANDAGES/DRESSINGS) ×6
DURAPREP 26ML APPLICATOR (WOUND CARE) ×3 IMPLANT
ELECT REM PT RETURN 9FT ADLT (ELECTROSURGICAL) ×3
ELECTRODE REM PT RTRN 9FT ADLT (ELECTROSURGICAL) ×1 IMPLANT
GLOVE BIOGEL PI IND STRL 9 (GLOVE) ×1 IMPLANT
GLOVE BIOGEL PI INDICATOR 9 (GLOVE) ×2
GLOVE SURG ORTHO 9.0 STRL STRW (GLOVE) ×3 IMPLANT
GOWN STRL REUS W/ TWL XL LVL3 (GOWN DISPOSABLE) ×2 IMPLANT
GOWN STRL REUS W/TWL XL LVL3 (GOWN DISPOSABLE) ×4
KIT BASIN OR (CUSTOM PROCEDURE TRAY) ×3 IMPLANT
KIT DRSG PREVENA PLUS 7DAY 125 (MISCELLANEOUS) ×3 IMPLANT
KIT TURNOVER KIT B (KITS) ×3 IMPLANT
MANIFOLD NEPTUNE II (INSTRUMENTS) ×3 IMPLANT
NS IRRIG 1000ML POUR BTL (IV SOLUTION) ×3 IMPLANT
PACK ORTHO EXTREMITY (CUSTOM PROCEDURE TRAY) ×3 IMPLANT
PAD ARMBOARD 7.5X6 YLW CONV (MISCELLANEOUS) ×3 IMPLANT
SPONGE LAP 18X18 X RAY DECT (DISPOSABLE) IMPLANT
STAPLER VISISTAT 35W (STAPLE) IMPLANT
STOCKINETTE IMPERVIOUS LG (DRAPES) ×3 IMPLANT
SUT SILK 2 0 (SUTURE) ×2
SUT SILK 2-0 18XBRD TIE 12 (SUTURE) ×1 IMPLANT
SUT VIC AB 1 CTX 27 (SUTURE) IMPLANT
TOWEL OR 17X26 10 PK STRL BLUE (TOWEL DISPOSABLE) ×3 IMPLANT
TUBE CONNECTING 12'X1/4 (SUCTIONS) ×1
TUBE CONNECTING 12X1/4 (SUCTIONS) ×2 IMPLANT
YANKAUER SUCT BULB TIP NO VENT (SUCTIONS) ×3 IMPLANT

## 2017-11-22 NOTE — Anesthesia Procedure Notes (Signed)
Procedure Name: LMA Insertion Date/Time: 11/22/2017 1:45 PM Performed by: De Nurse, CRNA Pre-anesthesia Checklist: Patient identified, Emergency Drugs available, Suction available and Patient being monitored Patient Re-evaluated:Patient Re-evaluated prior to induction Oxygen Delivery Method: Circle System Utilized Preoxygenation: Pre-oxygenation with 100% oxygen Induction Type: IV induction Ventilation: Mask ventilation without difficulty LMA: LMA inserted LMA Size: 5.0 Number of attempts: 1 Placement Confirmation: positive ETCO2 Tube secured with: Tape Dental Injury: Teeth and Oropharynx as per pre-operative assessment

## 2017-11-22 NOTE — Op Note (Signed)
   Date of Surgery: 11/22/2017  INDICATIONS: Mr. Brickle is a 72 y.o.-year-old male who is status post a left transmetatarsal amputation.  Patient has had progressive wound dehiscence with necrotic tissue laterally with exposed fifth metatarsal.  Patient is undergone conservative therapy with antibiotics, pressure unloading, topical nitroglycerin, as well as Trental.  Patient is failed conservative treatment and presents at this time for transtibial amputation.Marland Kitchen  PREOPERATIVE DIAGNOSIS: Necrosis ulceration osteomyelitis lateral left foot status post transmetatarsal amputation.  POSTOPERATIVE DIAGNOSIS: Same.  PROCEDURE: Transtibial amputation Application of Prevena wound VAC  SURGEON: Lajoyce Corners, M.D.  ANESTHESIA:  general  IV FLUIDS AND URINE: See anesthesia.  ESTIMATED BLOOD LOSS: 200 mL.  COMPLICATIONS: None.  DESCRIPTION OF PROCEDURE: The patient was brought to the operating room and underwent a general anesthetic. After adequate levels of anesthesia were obtained patient's lower extremity was prepped using DuraPrep draped into a sterile field. A timeout was called. The foot was draped out of the sterile field with impervious stockinette. A transverse incision was made 11 cm distal to the tibial tubercle. This curved proximally and a large posterior flap was created. The tibia was transected 1 cm proximal to the skin incision. The fibula was transected just proximal to the tibial incision. The tibia was beveled anteriorly. A large posterior flap was created. The sciatic nerve was pulled cut and allowed to retract. The vascular bundles were suture ligated with 2-0 silk. The deep and superficial fascial layers were closed using #1 Vicryl. The skin was closed using staples and 2-0 nylon. The wound was covered with a Prevena wound VAC. There was a good suction fit. A prosthetic shrinker was applied. Patient was extubated taken to the PACU in stable condition.   DISCHARGE PLANNING:  Antibiotic  duration: 24 hours postoperatively   Weightbearing: Nonweightbearing on the left  Pain medication: Ordered  Dressing care/ Wound VAC: Continue wound VAC for 1 week.  Discharge to: Anticipate discharge to inpatient versus outpatient rehabilitation.  Follow-up: In the office 1 week post operative.  Aldean Baker, MD [facility] 2:22 PM

## 2017-11-22 NOTE — Transfer of Care (Signed)
Immediate Anesthesia Transfer of Care Note  Patient: Kevin Bradford  Procedure(s) Performed: LEFT BELOW KNEE AMPUTATION (Left )  Patient Location: PACU  Anesthesia Type:General  Level of Consciousness: awake, alert  and oriented  Airway & Oxygen Therapy: Patient Spontanous Breathing  Post-op Assessment: Report given to RN  Post vital signs: Reviewed and stable  Last Vitals:  Vitals Value Taken Time  BP    Temp 36.4 C 11/22/2017  2:49 PM  Pulse 64 11/22/2017  2:51 PM  Resp 12 11/22/2017  2:51 PM  SpO2 97 % 11/22/2017  2:51 PM  Vitals shown include unvalidated device data.  Last Pain:  Vitals:   11/22/17 1245  TempSrc:   PainSc: 1       Patients Stated Pain Goal: 1 (11/22/17 1245)  Complications: No apparent anesthesia complications

## 2017-11-22 NOTE — H&P (Signed)
Kevin Bradford is an 72 y.o. male.   Chief Complaint: Progressive ulceration and pain left foot. HPI: Patient is a 72 year old gentleman with diabetic insensate neuropathy status post limb salvage intervention for the left foot who presents with progressive ulceration odor and drainage over the fifth metatarsal status post trans-metatarsal amputation.  Past Medical History:  Diagnosis Date  . Arthritis    "joint pain" (08/16/2017)  . Cellulitis and abscess of leg 10/18/2014  . CKD (chronic kidney disease) stage 3, GFR 30-59 ml/min (HCC)    creatinine increases with antibiotics per pt, no known kidney disease per patient  . Dehiscence of closure of skin, subsequent encounter   . Diabetic foot ulcer (HCC)    left  . Diabetic peripheral neuropathy (HCC)   . GERD (gastroesophageal reflux disease)   . Osteomyelitis (HCC)    left transmetatarsal   . Type II diabetes mellitus (HCC)     Past Surgical History:  Procedure Laterality Date  . AMPUTATION Left 07/19/2017   Procedure: LEFT TRANSMETATARSAL AMPUTATION;  Surgeon: Nadara Mustard, MD;  Location: [facility] OR;  Service: Orthopedics;  Laterality: Left;  . AMPUTATION Left 08/16/2017   REVISION LEFT TRANSMETATARSAL AMPUTATION  . CATARACT EXTRACTION W/ INTRAOCULAR LENS  IMPLANT, BILATERAL  ~ 2016  . I&D EXTREMITY Left 10/17/2014   Procedure: IRRIGATION AND DEBRIDEMENT EXTREMITY LEFT FOOT;  Surgeon: Nadara Mustard, MD;  Location: [facility] OR;  Service: Orthopedics;  Laterality: Left;  . STUMP REVISION Left 08/16/2017   Procedure: REVISION LEFT TRANSMETATARSAL AMPUTATION;  Surgeon: Nadara Mustard, MD;  Location: [facility] OR;  Service: Orthopedics;  Laterality: Left;  . TOE AMPUTATION Right 2013   5th toe    Family History  Problem Relation Age of Onset  . Dementia Mother    Social History:  reports that he has never smoked. He has never used smokeless tobacco. He reports that he drinks alcohol. He reports that he does not use drugs.  Allergies:  Allergies   Allergen Reactions  . Oxycodone Shortness Of Breath    No medications prior to admission.    No results found for this or any previous visit (from the past 48 hour(s)). No results found.  Review of Systems  All other systems reviewed and are negative.   There were no vitals taken for this visit. Physical Exam  Examination patient is alert oriented no adenopathy well-dressed normal affect no respiratory effort he does have an antalgic gait.  Patient has good capillary refill with a palpable pulse.  Examination the plantar aspect of the left foot he has progressive ulceration with gangrenous changes.  The superficial ulcer is now extends down to circumferential around the fifth metatarsal.  There is necrotic tissue there is no odor there is drainage the fifth metatarsal is palpable within the wound bed.  There is no ascending cellulitis. Assessment/Plan Assessment: Diabetic insensate neuropathy status post transmetatarsal amputation with progressive ulceration gangrenous changes and osteomyelitis of the fifth metatarsal.  Plan: Discussed with the patient we can continue with further foot salvage interventions with a complete fifth ray amputation.  Discussed difficulty with ambulation difficulty with maintaining a plantigrade foot.  Discussed with the patient's problem with microcirculation we have a high likelihood of developing further ulcerations or require further surgery for foot salvage intervention.  Discussed that his safest option would be to proceed with 1 surgery a transtibial amputation which should be durable and minimize his risk for further surgeries down the road.  Patient states he understands he is only  with interested in one further surgery is concerned about multiple surgeries.  Plan for transtibial amputation anticipate patient would be a good candidate for inpatient rehabilitation.  Nadara Mustard, MD 11/22/2017, 6:41 AM

## 2017-11-22 NOTE — Anesthesia Postprocedure Evaluation (Signed)
Anesthesia Post Note  Patient: Kevin Bradford  Procedure(s) Performed: LEFT BELOW KNEE AMPUTATION (Left )     Patient location during evaluation: PACU Anesthesia Type: General Level of consciousness: awake and alert Pain management: pain level controlled Vital Signs Assessment: post-procedure vital signs reviewed and stable Respiratory status: spontaneous breathing, nonlabored ventilation, respiratory function stable and patient connected to nasal cannula oxygen Cardiovascular status: blood pressure returned to baseline and stable Postop Assessment: no apparent nausea or vomiting Anesthetic complications: no    Last Vitals:  Vitals:   11/22/17 1500 11/22/17 1515  BP: (!) 143/71 (!) 145/72  Pulse: (!) 59 71  Resp: 15 18  Temp:    SpO2: 100% 99%    Last Pain:  Vitals:   11/22/17 1515  TempSrc:   PainSc: Asleep                 Tye Vigo,Morris S

## 2017-11-22 NOTE — Anesthesia Preprocedure Evaluation (Addendum)
Anesthesia Evaluation  Patient identified by MRN, date of birth, ID band Patient awake    Reviewed: Allergy & Precautions, NPO status , Patient's Chart, lab work & pertinent test results  Airway Mallampati: II  TM Distance: >3 FB Neck ROM: Full    Dental no notable dental hx. (+) Teeth Intact, Chipped, Dental Advisory Given,    Pulmonary neg pulmonary ROS,    Pulmonary exam normal breath sounds clear to auscultation       Cardiovascular negative cardio ROS Normal cardiovascular exam Rhythm:Regular Rate:Normal     Neuro/Psych negative neurological ROS  negative psych ROS   GI/Hepatic Neg liver ROS, GERD  ,  Endo/Other  diabetes  Renal/GU negative Renal ROS  negative genitourinary   Musculoskeletal negative musculoskeletal ROS (+)   Abdominal   Peds negative pediatric ROS (+)  Hematology negative hematology ROS (+)   Anesthesia Other Findings   Reproductive/Obstetrics negative OB ROS                            Anesthesia Physical Anesthesia Plan  ASA: II  Anesthesia Plan: General   Post-op Pain Management:    Induction: Intravenous  PONV Risk Score and Plan: 2 and Ondansetron and Dexamethasone  Airway Management Planned: LMA  Additional Equipment:   Intra-op Plan:   Post-operative Plan: Extubation in OR  Informed Consent: I have reviewed the patients History and Physical, chart, labs and discussed the procedure including the risks, benefits and alternatives for the proposed anesthesia with the patient or authorized representative who has indicated his/her understanding and acceptance.   Dental advisory given  Plan Discussed with: CRNA and Surgeon  Anesthesia Plan Comments:         Anesthesia Quick Evaluation

## 2017-11-23 ENCOUNTER — Other Ambulatory Visit: Payer: Self-pay

## 2017-11-23 ENCOUNTER — Encounter (HOSPITAL_COMMUNITY): Payer: Self-pay | Admitting: Orthopedic Surgery

## 2017-11-23 DIAGNOSIS — Z89512 Acquired absence of left leg below knee: Secondary | ICD-10-CM

## 2017-11-23 LAB — GLUCOSE, CAPILLARY
GLUCOSE-CAPILLARY: 147 mg/dL — AB (ref 65–99)
GLUCOSE-CAPILLARY: 130 mg/dL — AB (ref 65–99)
GLUCOSE-CAPILLARY: 139 mg/dL — AB (ref 65–99)
Glucose-Capillary: 111 mg/dL — ABNORMAL HIGH (ref 65–99)

## 2017-11-23 NOTE — Progress Notes (Signed)
I will follow up with pt and family in the morning to discuss their preference for rehab venue. [phone]

## 2017-11-23 NOTE — Plan of Care (Signed)

## 2017-11-23 NOTE — Evaluation (Signed)
Physical Therapy Evaluation Patient Details Name: Kevin Bradford MRN: [id number] DOB: 1946/01/02 Today's Date: 11/23/2017   History of Present Illness  Pt is a 72 y/o male s/p L transtibial amputation. PMH including but not limited to CKD and DM.  Clinical Impression  Pt presented supine in bed with HOB elevated, awake and willing to participate in therapy session. Prior to admission, pt reported that he was independent with functional mobility and ADLs. Pt lives in a two level house with one step to enter with his wife. He has good family support and can have 24/7 supervision/assistance upon d/c. Pt currently requires supervision for bed mobility, min A for stability to rise from an elevated bed position and min guard for pivotal movement to chair with RW. PT stressed importance of maintaining AROM at L knee for flexion and extension. Pt would greatly benefit from further intensive therapy services at [facility] to maximize pt's independence with functional mobility prior to returning home with family support. PT will continue to follow acutely to progress mobility as tolerated.     Follow Up Recommendations [facility]    Equipment Recommendations  None recommended by PT    Recommendations for Other Services Rehab consult     Precautions / Restrictions Precautions Precautions: Fall Restrictions Weight Bearing Restrictions: Yes LLE Weight Bearing: Non weight bearing      Mobility  Bed Mobility Overal bed mobility: Needs Assistance Bed Mobility: Supine to Sit     Supine to sit: Supervision     General bed mobility comments: supervision for safety  Transfers Overall transfer level: Needs assistance Equipment used: Rolling walker (2 wheeled) Transfers: Sit to/from Stand;Stand Pivot Transfers Sit to Stand: From elevated surface;Min assist Stand pivot transfers: Min guard       General transfer comment: increased time and effort, cueing for safe hand placement and technique, cueing for L  knee extension in standing; min guard for pivotal movements from bed to chair towards pt's R  Ambulation/Gait             General Gait Details: pt declined this session  Stairs            Wheelchair Mobility    Modified Rankin (Stroke Patients Only)       Balance Overall balance assessment: Needs assistance Sitting-balance support: No upper extremity supported Sitting balance-Leahy Scale: Good     Standing balance support: Bilateral upper extremity supported Standing balance-Leahy Scale: Poor Standing balance comment: reliant on bilateral UEs on RW                             Pertinent Vitals/Pain Pain Assessment: No/denies pain    Home Living Family/patient expects to be discharged to:: Private residence Living Arrangements: Spouse/significant other Available Help at Discharge: Family;Available 24 hours/day Type of Home: House Home Access: Stairs to enter   [employer] of Steps: 1 Home Layout: Two level;1/2 bath on main level Home Equipment: Walker - 2 wheels      Prior Function Level of Independence: Independent               Hand Dominance        Extremity/Trunk Assessment   Upper Extremity Assessment Upper Extremity Assessment: Overall WFL for tasks assessed;Defer to OT evaluation    Lower Extremity Assessment Lower Extremity Assessment: Overall WFL for tasks assessed;LLE deficits/detail LLE Deficits / Details: wound VAC in place; pt currently with full knee flexion/extension ROM  Cervical / Trunk Assessment Cervical / Trunk Assessment: Normal  Communication   Communication: No difficulties  Cognition Arousal/Alertness: Awake/alert Behavior During Therapy: WFL for tasks assessed/performed Overall Cognitive Status: Within Functional Limits for tasks assessed                                        General Comments      Exercises     Assessment/Plan    PT Assessment Patient needs  continued PT services  PT Problem List Decreased balance;Decreased mobility;Decreased coordination;Decreased knowledge of use of DME;Decreased safety awareness;Decreased knowledge of precautions       PT Treatment Interventions DME instruction;Gait training;Stair training;Functional mobility training;Therapeutic exercise;Therapeutic activities;Balance training;Neuromuscular re-education;Patient/family education    PT Goals (Current goals can be found in the Care Plan section)  Acute Rehab PT Goals Patient Stated Goal: return to independence PT Goal Formulation: With patient Time For Goal Achievement: 12/07/17 Potential to Achieve Goals: Good    Frequency Min 5X/week   Barriers to discharge        Co-evaluation               AM-PAC PT "6 Clicks" Daily Activity  Outcome Measure Difficulty turning over in bed (including adjusting bedclothes, sheets and blankets)?: A Little Difficulty moving from lying on back to sitting on the side of the bed? : A Little Difficulty sitting down on and standing up from a chair with arms (e.g., wheelchair, bedside commode, etc,.)?: Unable Help needed moving to and from a bed to chair (including a wheelchair)?: A Little Help needed walking in hospital room?: A Little Help needed climbing 3-5 steps with a railing? : A Lot 6 Click Score: 15    End of Session Equipment Utilized During Treatment: Gait belt Activity Tolerance: Patient tolerated treatment well Patient left: in chair;with call bell/phone within reach Nurse Communication: Mobility status PT Visit Diagnosis: Other abnormalities of gait and mobility (R26.89)    Time: 9604-5409 PT Time Calculation (min) (ACUTE ONLY): 30 min   Charges:   PT Evaluation $PT Eval Moderate Complexity: 1 Mod PT Treatments $Therapeutic Activity: 8-22 mins   PT G Codes:        [place], PT, DPT [phone]   Alessandra Bevels Shelah Heatley 11/23/2017, 1:57 PM

## 2017-11-23 NOTE — Consult Note (Signed)
Physical Medicine and Rehabilitation Consult Reason for Consult: Decreased functional mobility Referring Physician: [facility]   HPI: Kevin Bradford is a 72 y.o. right-handed male with history of CKD stage III, diabetes mellitus and peripheral vascular disease with left transmetatarsal amputation 07/19/2017 with revision 08/16/2017.  Per report patient lives with his wife.  Two-level home with bedroom on [address] and one-step to entry.  Wife can assist as needed.  Presented 11/22/2017 with progressive wound dehiscence left lower extremity with foul-smelling odor.  Underwent transmetatarsal amputation 11/22/2017 per Dr. Sharol Given.  Hospital course pain management.  Physical and occupational therapy evaluations pending.  MD has requested physical medicine rehab consult.   Review of Systems  Constitutional: Negative for chills and fever.  HENT: Negative for hearing loss.   Eyes: Negative for blurred vision and double vision.  Respiratory: Negative for cough and shortness of breath.   Cardiovascular: Positive for leg swelling. Negative for chest pain.  Gastrointestinal: Positive for constipation. Negative for nausea and vomiting.       GERD  Genitourinary: Negative for dysuria, flank pain and hematuria.  Musculoskeletal: Positive for joint pain and myalgias.  Skin: Negative for rash.  Neurological: Positive for sensory change.  All other systems reviewed and are negative.  Past Medical History:  Diagnosis Date  . Arthritis    "joint pain" (08/16/2017)  . Cellulitis and abscess of leg 10/18/2014  . CKD (chronic kidney disease) stage 3, GFR 30-59 ml/min (HCC)    creatinine increases with antibiotics per pt, no known kidney disease per patient  . Dehiscence of closure of skin, subsequent encounter   . Diabetic foot ulcer ([place])    left  . Diabetic peripheral neuropathy ([place])   . GERD (gastroesophageal reflux disease)   . Osteomyelitis ([place])    left transmetatarsal   . Type II diabetes mellitus ([place])     Past Surgical History:  Procedure Laterality Date  . AMPUTATION Left 07/19/2017   Procedure: LEFT TRANSMETATARSAL AMPUTATION;  Surgeon: Newt Minion, MD;  Location: [facility];  Service: Orthopedics;  Laterality: Left;  . AMPUTATION Left 08/16/2017   REVISION LEFT TRANSMETATARSAL AMPUTATION  . CATARACT EXTRACTION W/ INTRAOCULAR LENS  IMPLANT, BILATERAL  ~ 2016  . I&D EXTREMITY Left 10/17/2014   Procedure: IRRIGATION AND DEBRIDEMENT EXTREMITY LEFT FOOT;  Surgeon: Newt Minion, MD;  Location: [facility];  Service: Orthopedics;  Laterality: Left;  . STUMP REVISION Left 08/16/2017   Procedure: REVISION LEFT TRANSMETATARSAL AMPUTATION;  Surgeon: Newt Minion, MD;  Location: [facility];  Service: Orthopedics;  Laterality: Left;  . TOE AMPUTATION Right 2013   5th toe   Family History  Problem Relation Age of Onset  . Dementia Mother    Social History:  reports that he has never smoked. He has never used smokeless tobacco. He reports that he drinks alcohol. He reports that he does not use drugs. Allergies:  Allergies  Allergen Reactions  . Oxycodone Shortness Of Breath   Medications Prior to Admission  Medication Sig Dispense Refill  . insulin aspart (NOVOLOG) 100 UNIT/ML FlexPen Inject 6 Units into the skin 3 (three) times daily with meals. (Patient taking differently: Inject 8 Units into the skin 3 (three) times daily after meals. ) 15 mL 1  . Insulin Detemir (LEVEMIR) 100 UNIT/ML Pen Inject 40 Units into the skin daily at 10 pm. (Patient taking differently: Inject 40 Units into the skin at bedtime. ) 15 mL 1  . metFORMIN (GLUCOPHAGE) 500 MG tablet Take 500  mg by mouth 2 (two) times daily with a meal.     . nitroGLYCERIN (NITRODUR - DOSED IN MG/24 HR) 0.2 mg/hr patch Place 1 patch (0.2 mg total) onto the skin daily. (Patient taking differently: Place 0.2 mg onto the skin daily. Applied to left foot) 30 patch 12  . Omega-3 Fatty Acids (OMEGA 3 PO) Take 1 capsule by mouth 2 (two) times a week.     .  pentoxifylline (TRENTAL) 400 MG CR tablet Take 1 tablet (400 mg total) by mouth 3 (three) times daily with meals. 90 tablet 3  . blood glucose meter kit and supplies KIT Dispense based on patient and insurance preference. Use up to four times daily as directed. (FOR ICD-9 250.00, 250.01). 1 each 0  . HYDROcodone-acetaminophen (NORCO/VICODIN) 5-325 MG tablet Take 1 tablet by mouth every 4 (four) hours as needed for moderate pain. (Patient not taking: Reported on 11/21/2017) 60 tablet 0  . Insulin Pen Needle 32G X 4 MM MISC Use with insulin pens to dispense insulin as directed 100 each 1  . sulfamethoxazole-trimethoprim (BACTRIM DS,SEPTRA DS) 800-160 MG tablet Take 1 tablet by mouth 2 (two) times daily. (Patient not taking: Reported on 11/21/2017) 60 tablet 0    Home:    Functional History:   Functional Status:  Mobility:          ADL:    Cognition: Cognition Orientation Level: Oriented X4    Blood pressure 127/89, pulse 60, temperature 97.7 F (36.5 C), temperature source Oral, resp. rate 15, height _0  (1.88 m), weight 100.7 kg (222 lb), SpO2 100 %. Physical Exam  Vitals reviewed. Constitutional: He is oriented to person, place, and time. He appears well-developed and well-nourished.  HENT:  Head: Normocephalic.  Eyes: Pupils are equal, round, and reactive to light.  Neck: Normal range of motion.  Cardiovascular: Normal rate.  Respiratory: Effort normal.  GI: Soft.  Musculoskeletal:  Normal muscle tone. Left Bk in Vac  Neurological: He is alert and oriented to person, place, and time. No cranial nerve deficit.  Strength 5/5 UE. RLE 4-5/5. Able to lift Left leg agst gravity  Psychiatric: He has a normal mood and affect. His behavior is normal. Thought content normal.  Skin.  Wound VAC in place to amputation site appropriately tender  Results for orders placed or performed during the hospital encounter of 11/22/17 (from the past 24 hour(s))  Glucose, capillary     Status:  Abnormal   Collection Time: 11/22/17 12:21 PM  Result Value Ref Range   Glucose-Capillary 133 (H) 65 - 99 mg/dL  Basic metabolic panel     Status: Abnormal   Collection Time: 11/22/17 12:24 PM  Result Value Ref Range   Sodium 141 135 - 145 mmol/L   Potassium 4.2 3.5 - 5.1 mmol/L   Chloride 110 101 - 111 mmol/L   CO2 22 22 - 32 mmol/L   Glucose, Bld 153 (H) 65 - 99 mg/dL   BUN 20 6 - 20 mg/dL   Creatinine, Ser 1.28 (H) 0.61 - 1.24 mg/dL   Calcium 8.8 (L) 8.9 - 10.3 mg/dL   GFR calc non Af Amer 54 (L) >60 mL/min   GFR calc Af Amer >60 >60 mL/min   Anion gap 9 5 - 15  CBC     Status: Abnormal   Collection Time: 11/22/17 12:24 PM  Result Value Ref Range   WBC 8.1 4.0 - 10.5 K/uL   RBC 4.14 (L) 4.22 - 5.81 MIL/uL  Hemoglobin 12.2 (L) 13.0 - 17.0 g/dL   HCT 37.4 (L) 39.0 - 52.0 %   MCV 90.3 78.0 - 100.0 fL   MCH 29.5 26.0 - 34.0 pg   MCHC 32.6 30.0 - 36.0 g/dL   RDW 12.7 11.5 - 15.5 %   Platelets 216 150 - 400 K/uL  Hemoglobin A1c     Status: Abnormal   Collection Time: 11/22/17 12:24 PM  Result Value Ref Range   Hgb A1c MFr Bld 6.7 (H) 4.8 - 5.6 %   Mean Plasma Glucose 145.59 mg/dL  Surgical pcr screen     Status: None   Collection Time: 11/22/17  1:29 PM  Result Value Ref Range   MRSA, PCR NEGATIVE NEGATIVE   Staphylococcus aureus NEGATIVE NEGATIVE  Glucose, capillary     Status: Abnormal   Collection Time: 11/22/17  2:55 PM  Result Value Ref Range   Glucose-Capillary 148 (H) 65 - 99 mg/dL  Glucose, capillary     Status: Abnormal   Collection Time: 11/22/17  5:13 PM  Result Value Ref Range   Glucose-Capillary 188 (H) 65 - 99 mg/dL   No results found.   Assessment/Plan: Diagnosis: left BKA 1. Does the need for close, 24 hr/day medical supervision in concert with the patient's rehab needs make it unreasonable for this patient to be served in a less intensive setting? Yes, potentially 2. Co-Morbidities requiring supervision/potential complications: CKD, DM 3. Due to  bladder management, bowel management, safety, skin/wound care, disease management, medication administration, pain management and patient education, does the patient require 24 hr/day rehab nursing? Yes, potentially 4. Does the patient require coordinated care of a physician, rehab nurse, PT (1-2 hrs/day, 5 days/week) and OT (1-2 hrs/day, 5 days/week) to address physical and functional deficits in the context of the above medical diagnosis(es)? Yes and Potentially Addressing deficits in the following areas: balance, endurance, locomotion, strength, transferring, bowel/bladder control, bathing, dressing, feeding, grooming, toileting and psychosocial support 5. Can the patient actively participate in an intensive therapy program of at least 3 hrs of therapy per day at least 5 days per week? Potentially 6. The potential for patient to make measurable gains while on inpatient rehab is good 7. Anticipated functional outcomes upon discharge from inpatient rehab are modified independent  with PT, modified independent with OT, n/a with SLP. 8. Estimated rehab length of stay to reach the above functional goals is: potentially 6-10 days 9. Anticipated D/C setting: Home 10. Anticipated post D/C treatments: HH therapy and Outpatient therapy 11. Overall Rehab/Functional Prognosis: excellent  RECOMMENDATIONS: This patient's condition is appropriate for continued rehabilitative care in the following setting: [facility] Patient has agreed to participate in recommended program. Yes Note that insurance prior authorization may be required for reimbursement for recommended care.  Comment: Rehab Admissions Coordinator to follow up.  Thanks,  Meredith Staggers, MD, Mellody Drown  I have personally performed a face to face diagnostic evaluation of this patient. Additionally, I have reviewed and concur with the physician assistant's documentation above.     Lavon Paganini Angiulli, PA-C 11/23/2017

## 2017-11-23 NOTE — Progress Notes (Signed)
Orthopedic Tech Progress Note Patient Details:  Kevin Bradford 11-12-1945 [id number]  Patient ID: [name], male   DOB: 1946-03-19, 72 y.o.   MRN: [id number]   Saul Fordyce 11/23/2017, 11:08 AMCalled [facility] for left stump shrinnker and protector.

## 2017-11-23 NOTE — Evaluation (Signed)
Occupational Therapy Evaluation Patient Details Name: Kevin Bradford MRN: [id number] DOB: [birth year] Today's Date: 11/23/2017    History of Present Illness Pt is a 72 y/o male s/p L transtibial amputation. PMH including but not limited to CKD and DM.   Clinical Impression   Pt admitted for LLE amputation as stated above. Pt currently with functional limitations due to the deficits listed below (see OT Problem List). Pt will benefit from skilled OT to increase their safety and independence with ADL and functional mobility for ADL to facilitate discharge to venue listed below.      Follow Up Recommendations  [facility]    Equipment Recommendations  3 in 1 bedside commode;Tub/shower bench       Precautions / Restrictions Precautions Precautions: Fall Restrictions Weight Bearing Restrictions: Yes LLE Weight Bearing: Non weight bearing      Mobility Bed Mobility Overal bed mobility: Needs Assistance Bed Mobility: Sit to Supine     Supine to sit: Supervision Sit to supine: Min guard   General bed mobility comments: supervision for safety  Transfers Overall transfer level: Needs assistance Equipment used: Rolling walker (2 wheeled) Transfers: Sit to/from [employer] Sit to Stand: From elevated surface;Mod assist Stand pivot transfers: Min guard       General transfer comment: VC for hand placement and sequencing.    Balance Overall balance assessment: Needs assistance Sitting-balance support: No upper extremity supported Sitting balance-Leahy Scale: Good     Standing balance support: Bilateral upper extremity supported Standing balance-Leahy Scale: Poor Standing balance comment: reliant on bilateral UEs on RW                           ADL either performed or assessed with clinical judgement   ADL Overall ADL's : Needs assistance/impaired Eating/Feeding: Sitting;Independent   Grooming: Set up;Sitting   Upper Body Bathing: Set up;Sitting    Lower Body Bathing: Maximal assistance;Cueing for safety;Cueing for sequencing;Sit to/from stand;Sitting/lateral leans   Upper Body Dressing : Set up;Sitting   Lower Body Dressing: Maximal assistance;Sit to/from stand;Cueing for sequencing   Toilet Transfer: Moderate assistance;RW;Stand-pivot   Toileting- Clothing Manipulation and Hygiene: Maximal assistance;Sit to/from stand;Cueing for sequencing;Cueing for safety         General ADL Comments: pt has great family support.  pt will benefit from inpt rehab      Vision Patient Visual Report: No change from baseline              Pertinent Vitals/Pain Pain Assessment: 0-10 Pain Score: 2  Pain Descriptors / Indicators: Dull Pain Intervention(s): Limited activity within patient's tolerance;Repositioned;Monitored during session     Hand Dominance     Extremity/Trunk Assessment Upper Extremity Assessment Upper Extremity Assessment: Overall WFL for tasks assessed   Lower Extremity Assessment Lower Extremity Assessment: Overall WFL for tasks assessed;LLE deficits/detail LLE Deficits / Details: wound VAC in place; pt currently with full knee flexion/extension ROM    Cervical / Trunk Assessment Cervical / Trunk Assessment: Normal   Communication Communication Communication: No difficulties   Cognition Arousal/Alertness: Awake/alert Behavior During Therapy: WFL for tasks assessed/performed Overall Cognitive Status: Within Functional Limits for tasks assessed                                                Home Living Family/patient expects to be discharged to::  Private residence Living Arrangements: Spouse/significant other Available Help at Discharge: Family;Available 24 hours/day Type of Home: House Home Access: Stairs to enter [employer] of Steps: 1   Home Layout: Two level;1/2 bath on main level Alternate Level Stairs-Number of Steps: flight, bedroom is upstairs but he can stay on the  first floor        Bathroom Toilet: Handicapped height     Home Equipment: Walker - 2 wheels          Prior Functioning/Environment Level of Independence: Independent                 OT Problem List: Decreased strength;Impaired balance (sitting and/or standing);Decreased knowledge of precautions;Decreased knowledge of use of DME or AE      OT Treatment/Interventions: Self-care/ADL training;Patient/family education;DME and/or AE instruction;Therapeutic activities    OT Goals(Current goals can be found in the care plan section) Acute Rehab OT Goals Patient Stated Goal: return to independence OT Goal Formulation: With patient Time For Goal Achievement: 12/07/17 Potential to Achieve Goals: Good  OT Frequency: Min 2X/week              AM-PAC PT "6 Clicks" Daily Activity     Outcome Measure Help from another person eating meals?: None Help from another person taking care of personal grooming?: None Help from another person toileting, which includes using toliet, bedpan, or urinal?: A Lot Help from another person bathing (including washing, rinsing, drying)?: A Little Help from another person to put on and taking off regular upper body clothing?: None Help from another person to put on and taking off regular lower body clothing?: A Lot 6 Click Score: 19   End of Session Nurse Communication: Mobility status  Activity Tolerance: Patient tolerated treatment well Patient left: in bed;with call bell/phone within reach  OT Visit Diagnosis: Unsteadiness on feet (R26.81);Pain Pain - Right/Left: Left Pain - part of body: Leg                Time: 1335-1359 OT Time Calculation (min): 24 min Charges:  OT General Charges $OT Visit: 1 Visit OT Evaluation $OT Eval Moderate Complexity: 1 Mod OT Treatments $Self Care/Home Management : 8-22 mins G-Codes:     Lise Auer, OT [phone]  Einar Crow D 11/23/2017, 2:13 PM

## 2017-11-23 NOTE — Progress Notes (Signed)
Patient ID: Kevin Bradford, male   DOB: [birth year], 72 y.o.   MRN: [id number] Patient is postoperative day 1 left transtibial amputation.  Semination the dressing is dry there is no drainage in the canister.  Patient will get a wound protector and stump shrinker from [employer].  Anticipate potential discharge to inpatient rehabilitation pending insurance coverage.

## 2017-11-24 ENCOUNTER — Encounter (HOSPITAL_COMMUNITY): Payer: Self-pay | Admitting: *Deleted

## 2017-11-24 ENCOUNTER — Inpatient Hospital Stay (HOSPITAL_COMMUNITY)
Admission: RE | Admit: 2017-11-24 | Discharge: 2017-11-30 | DRG: 560 | Disposition: A | Discharge status: Home Health | Payer: Medicare Other | Source: Intra-hospital | Attending: Physical Medicine & Rehabilitation | Admitting: Physical Medicine & Rehabilitation

## 2017-11-24 ENCOUNTER — Other Ambulatory Visit: Payer: Self-pay

## 2017-11-24 DIAGNOSIS — E1142 Type 2 diabetes mellitus with diabetic polyneuropathy: Secondary | ICD-10-CM

## 2017-11-24 DIAGNOSIS — R7309 Other abnormal glucose: Secondary | ICD-10-CM | POA: Diagnosis not present

## 2017-11-24 DIAGNOSIS — K5900 Constipation, unspecified: Secondary | ICD-10-CM | POA: Diagnosis present

## 2017-11-24 DIAGNOSIS — E1151 Type 2 diabetes mellitus with diabetic peripheral angiopathy without gangrene: Secondary | ICD-10-CM | POA: Diagnosis present

## 2017-11-24 DIAGNOSIS — I739 Peripheral vascular disease, unspecified: Secondary | ICD-10-CM

## 2017-11-24 DIAGNOSIS — S88112A Complete traumatic amputation at level between knee and ankle, left lower leg, initial encounter: Secondary | ICD-10-CM | POA: Diagnosis not present

## 2017-11-24 DIAGNOSIS — N183 Chronic kidney disease, stage 3 unspecified: Secondary | ICD-10-CM

## 2017-11-24 DIAGNOSIS — D62 Acute posthemorrhagic anemia: Secondary | ICD-10-CM | POA: Diagnosis not present

## 2017-11-24 DIAGNOSIS — N182 Chronic kidney disease, stage 2 (mild): Secondary | ICD-10-CM

## 2017-11-24 DIAGNOSIS — G8918 Other acute postprocedural pain: Secondary | ICD-10-CM | POA: Diagnosis not present

## 2017-11-24 DIAGNOSIS — Z4781 Encounter for orthopedic aftercare following surgical amputation: Principal | ICD-10-CM

## 2017-11-24 DIAGNOSIS — E1122 Type 2 diabetes mellitus with diabetic chronic kidney disease: Secondary | ICD-10-CM | POA: Diagnosis present

## 2017-11-24 DIAGNOSIS — R0989 Other specified symptoms and signs involving the circulatory and respiratory systems: Secondary | ICD-10-CM

## 2017-11-24 DIAGNOSIS — Z794 Long term (current) use of insulin: Secondary | ICD-10-CM

## 2017-11-24 DIAGNOSIS — Z89512 Acquired absence of left leg below knee: Secondary | ICD-10-CM

## 2017-11-24 DIAGNOSIS — K219 Gastro-esophageal reflux disease without esophagitis: Secondary | ICD-10-CM | POA: Diagnosis present

## 2017-11-24 DIAGNOSIS — E119 Type 2 diabetes mellitus without complications: Secondary | ICD-10-CM | POA: Diagnosis not present

## 2017-11-24 LAB — GLUCOSE, CAPILLARY
GLUCOSE-CAPILLARY: 126 mg/dL — AB (ref 65–99)
Glucose-Capillary: 84 mg/dL (ref 65–99)
Glucose-Capillary: 82 mg/dL (ref 65–99)
Glucose-Capillary: 90 mg/dL (ref 65–99)

## 2017-11-24 MED ORDER — PANTOPRAZOLE SODIUM 40 MG PO TBEC
40.0000 mg | DELAYED_RELEASE_TABLET | Freq: Every day | ORAL | Status: DC
  Administered 2017-11-25 – 2017-11-30 (×6): 40 mg via ORAL
  Filled 2017-11-24 (×6): qty 1

## 2017-11-24 MED ORDER — POLYETHYLENE GLYCOL 3350 17 G PO PACK
17.0000 g | PACK | Freq: Every day | ORAL | Status: DC | PRN
  Filled 2017-11-24: qty 1

## 2017-11-24 MED ORDER — INSULIN ASPART 100 UNIT/ML ~~LOC~~ SOLN
4.0000 [IU] | Freq: Three times a day (TID) | SUBCUTANEOUS | Status: DC
  Administered 2017-11-24 – 2017-11-30 (×16): 4 [IU] via SUBCUTANEOUS

## 2017-11-24 MED ORDER — ASPIRIN EC 325 MG PO TBEC
325.0000 mg | DELAYED_RELEASE_TABLET | Freq: Every day | ORAL | Status: DC
  Administered 2017-11-25 – 2017-11-30 (×6): 325 mg via ORAL
  Filled 2017-11-24 (×6): qty 1

## 2017-11-24 MED ORDER — METHOCARBAMOL 1000 MG/10ML IJ SOLN
500.0000 mg | Freq: Four times a day (QID) | INTRAVENOUS | Status: DC | PRN
  Filled 2017-11-24: qty 5

## 2017-11-24 MED ORDER — HYDROCODONE-ACETAMINOPHEN 5-325 MG PO TABS
1.0000 | ORAL_TABLET | ORAL | Status: DC | PRN
  Administered 2017-11-24: 2 via ORAL
  Administered 2017-11-25: 1 via ORAL
  Administered 2017-11-25 – 2017-11-29 (×9): 2 via ORAL
  Filled 2017-11-24 (×11): qty 2

## 2017-11-24 MED ORDER — ONDANSETRON HCL 4 MG PO TABS: 4.0000 mg | ORAL_TABLET | Freq: Four times a day (QID) | ORAL | Status: DC | PRN

## 2017-11-24 MED ORDER — METFORMIN HCL 500 MG PO TABS
500.0000 mg | ORAL_TABLET | Freq: Two times a day (BID) | ORAL | Status: DC
  Administered 2017-11-24 – 2017-11-30 (×11): 500 mg via ORAL
  Filled 2017-11-24 (×12): qty 1

## 2017-11-24 MED ORDER — BISACODYL 10 MG RE SUPP: 10.0000 mg | Freq: Every day | RECTAL | Status: DC | PRN

## 2017-11-24 MED ORDER — ONDANSETRON HCL 4 MG/2ML IJ SOLN: 4.0000 mg | Freq: Four times a day (QID) | INTRAMUSCULAR | Status: DC | PRN

## 2017-11-24 MED ORDER — INSULIN DETEMIR 100 UNIT/ML ~~LOC~~ SOLN
30.0000 [IU] | Freq: Every day | SUBCUTANEOUS | Status: DC
  Administered 2017-11-24 – 2017-11-26 (×3): 30 [IU] via SUBCUTANEOUS
  Administered 2017-11-27: 25 [IU] via SUBCUTANEOUS
  Filled 2017-11-24 (×4): qty 0.3

## 2017-11-24 MED ORDER — ACETAMINOPHEN 325 MG PO TABS: 325.0000 mg | ORAL_TABLET | Freq: Four times a day (QID) | ORAL | Status: DC | PRN

## 2017-11-24 MED ORDER — DOCUSATE SODIUM 100 MG PO CAPS
100.0000 mg | ORAL_CAPSULE | Freq: Two times a day (BID) | ORAL | Status: DC
  Administered 2017-11-24 – 2017-11-29 (×9): 100 mg via ORAL
  Filled 2017-11-24 (×11): qty 1

## 2017-11-24 MED ORDER — PENTOXIFYLLINE ER 400 MG PO TBCR
400.0000 mg | EXTENDED_RELEASE_TABLET | Freq: Three times a day (TID) | ORAL | Status: DC
  Administered 2017-11-24 – 2017-11-30 (×16): 400 mg via ORAL
  Filled 2017-11-24 (×20): qty 1

## 2017-11-24 MED ORDER — METHOCARBAMOL 500 MG PO TABS: 500.0000 mg | ORAL_TABLET | Freq: Four times a day (QID) | ORAL | Status: DC | PRN

## 2017-11-24 MED ORDER — INSULIN ASPART 100 UNIT/ML ~~LOC~~ SOLN
0.0000 [IU] | Freq: Three times a day (TID) | SUBCUTANEOUS | Status: DC
  Administered 2017-11-26 – 2017-11-27 (×2): 2 [IU] via SUBCUTANEOUS

## 2017-11-24 NOTE — H&P (Signed)
Physical Medicine and Rehabilitation Admission H&P    Chief complaint: Stump pain   HPI: Kevin Bradford is a 72 year old right-handed male with history of CKD stage III, diabetes mellitus with peripheral neuropathy, peripheral vascular disease with left transmetatarsal amputation 07/19/2017 and revision 08/16/2017.  Per report patient lives with his wife.  2 level home with bedroom on Main level and one-step entry.  Wife assists as needed.  Presented 11/22/2017 with progressive wound dehiscence as well as necrosis ulceration osteomyelitis lateral left foot.  Underwent transmetatarsal 11/22/2017 per Dr. Sharol Given.  Hospital course pain management.  Application postoperatively of wound VAC.  Physical and occupational therapy evaluations completed 11/23/2017 with recommendations of physical medicine rehab consult.  Patient was admitted for a comprehensive rehab program.  Review of Systems  Constitutional: Negative for chills and fever.  HENT: Negative for hearing loss.   Eyes: Negative for blurred vision and double vision.  Respiratory: Negative for cough and shortness of breath.   Cardiovascular: Positive for leg swelling. Negative for palpitations.  Gastrointestinal: Positive for constipation. Negative for nausea and vomiting.       GERD  Genitourinary: Negative for dysuria, flank pain and hematuria.  Musculoskeletal: Positive for joint pain and myalgias.       Past Medical History:  Diagnosis Date  . Arthritis    "joint pain" (08/16/2017)  . Cellulitis and abscess of leg 10/18/2014  . CKD (chronic kidney disease) stage 3, GFR 30-59 ml/min (HCC)    creatinine increases with antibiotics per pt, no known kidney disease per patient  . Dehiscence of closure of skin, subsequent encounter   . Diabetic foot ulcer ([place])    left  . Diabetic peripheral neuropathy ([place])   . GERD (gastroesophageal reflux disease)   . Osteomyelitis ([place])    left transmetatarsal   . Type II diabetes mellitus  ([place])         Past Surgical History:  Procedure Laterality Date  . AMPUTATION Left 07/19/2017   Procedure: LEFT TRANSMETATARSAL AMPUTATION;  Surgeon: Newt Minion, MD;  Location: [facility] [facility];  Service: Orthopedics;  Laterality: Left;  . AMPUTATION Left 08/16/2017   REVISION LEFT TRANSMETATARSAL AMPUTATION  . AMPUTATION Left 11/22/2017   Procedure: LEFT BELOW KNEE AMPUTATION;  Surgeon: Newt Minion, MD;  Location: [facility];  Service: Orthopedics;  Laterality: Left;  . CATARACT EXTRACTION W/ INTRAOCULAR LENS  IMPLANT, BILATERAL  ~ 2016  . I&D EXTREMITY Left 10/17/2014   Procedure: IRRIGATION AND DEBRIDEMENT EXTREMITY LEFT FOOT;  Surgeon: Newt Minion, MD;  Location: [facility];  Service: Orthopedics;  Laterality: Left;  . STUMP REVISION Left 08/16/2017   Procedure: REVISION LEFT TRANSMETATARSAL AMPUTATION;  Surgeon: Newt Minion, MD;  Location: [facility];  Service: Orthopedics;  Laterality: Left;  . TOE AMPUTATION Right 2013   5th toe        Family History  Problem Relation Age of Onset  . Dementia Mother    Social History:  reports that he has never smoked. He has never used smokeless tobacco. He reports that he drinks alcohol. He reports that he does not use drugs. Allergies:      Allergies  Allergen Reactions  . Oxycodone Shortness Of Breath         Medications Prior to Admission  Medication Sig Dispense Refill  . insulin aspart (NOVOLOG) 100 UNIT/ML FlexPen Inject 6 Units into the skin 3 (three) times daily with meals. (Patient taking differently: Inject 8 Units into the skin 3 (three) times daily after  meals. ) 15 mL 1  . Insulin Detemir (LEVEMIR) 100 UNIT/ML Pen Inject 40 Units into the skin daily at 10 pm. (Patient taking differently: Inject 40 Units into the skin at bedtime. ) 15 mL 1  . metFORMIN (GLUCOPHAGE) 500 MG tablet Take 500 mg by mouth 2 (two) times daily with a meal.     . nitroGLYCERIN (NITRODUR - DOSED IN MG/24 HR) 0.2 mg/hr patch Place 1 patch (0.2 mg total) onto  the skin daily. (Patient taking differently: Place 0.2 mg onto the skin daily. Applied to left foot) 30 patch 12  . Omega-3 Fatty Acids (OMEGA 3 PO) Take 1 capsule by mouth 2 (two) times a week.     . pentoxifylline (TRENTAL) 400 MG CR tablet Take 1 tablet (400 mg total) by mouth 3 (three) times daily with meals. 90 tablet 3  . blood glucose meter kit and supplies KIT Dispense based on patient and insurance preference. Use up to four times daily as directed. (FOR ICD-9 250.00, 250.01). 1 each 0  . HYDROcodone-acetaminophen (NORCO/VICODIN) 5-325 MG tablet Take 1 tablet by mouth every 4 (four) hours as needed for moderate pain. (Patient not taking: Reported on 11/21/2017) 60 tablet 0  . Insulin Pen Needle 32G X 4 MM MISC Use with insulin pens to dispense insulin as directed 100 each 1  . sulfamethoxazole-trimethoprim (BACTRIM DS,SEPTRA DS) 800-160 MG tablet Take 1 tablet by mouth 2 (two) times daily. (Patient not taking: Reported on 11/21/2017) 60 tablet 0    Drug Regimen Review Drug regimen was reviewed and remains appropriate with no significant issues identified  Home: Home Living Family/patient expects to be discharged to:: Inpatient rehab Living Arrangements: Spouse/significant other Available Help at Discharge: Family, Available 24 hours/day Type of Home: House Home Access: Stairs to enter [employer] of Steps: 1 Home Layout: Two level, 1/2 bath on main level Alternate Level Stairs-Number of Steps: flight, bedroom is upstairs but he can stay on the first floor  Bathroom Toilet: Handicapped height Home Equipment: Walker - 2 wheels   Functional History: Prior Function Level of Independence: Independent  Functional Status:  Mobility: Bed Mobility Overal bed mobility: Needs Assistance Bed Mobility: Sit to Supine Supine to sit: Supervision Sit to supine: Min guard General bed mobility comments: supervision for safety Transfers Overall transfer level: Needs  assistance Equipment used: Rolling walker (2 wheeled) Transfers: Sit to/from Stand, [employer] Transfers Sit to Stand: From elevated surface, Mod assist Stand pivot transfers: Min guard General transfer comment: VC for hand placement and sequencing. Ambulation/Gait General Gait Details: pt declined this session  ADL: ADL Overall ADL's : Needs assistance/impaired Eating/Feeding: Sitting, Independent Grooming: Set up, Sitting Upper Body Bathing: Set up, Sitting Lower Body Bathing: Maximal assistance, Cueing for safety, Cueing for sequencing, Sit to/from stand, Sitting/lateral leans Upper Body Dressing : Set up, Sitting Lower Body Dressing: Maximal assistance, Sit to/from stand, Cueing for sequencing Toilet Transfer: Moderate assistance, RW, Stand-pivot Toileting- Clothing Manipulation and Hygiene: Maximal assistance, Sit to/from stand, Cueing for sequencing, Cueing for safety General ADL Comments: pt has great family support.  pt will benefit from inpt rehab   Cognition: Cognition Overall Cognitive Status: Within Functional Limits for tasks assessed Orientation Level: Oriented X4 Cognition Arousal/Alertness: Awake/alert Behavior During Therapy: WFL for tasks assessed/performed Overall Cognitive Status: Within Functional Limits for tasks assessed  Physical Exam: Blood pressure (!) 146/77, pulse 60, temperature 98.6 F (37 C), temperature source Oral, resp. rate 20, height 6' 2"  (1.88 m), weight 100.7 kg (222 lb),  SpO2 98 %. Physical Exam  Constitutional: He appears well-developed.  HENT:  Head: Normocephalic.  Eyes: Pupils are equal, round, and reactive to light.  Neck: Normal range of motion.  Cardiovascular: Normal rate. Exam reveals no gallop and no friction rub.  No murmur heard. Respiratory: Effort normal. No respiratory distress. He has no wheezes. He has no rales.  GI: Soft. He exhibits no distension. There is no tenderness.  Musculoskeletal:  Left leg with edema.  Tender to touch.  Neurological: He is alert. No cranial nerve deficit. Coordination normal.  UE 5/5 prox to distal. RLE 4-5/5 prox to distal. LLE: able to lift against gravity  Skin:  BKA site is dressed with wound VAC in place appropriately tender  Psychiatric: He has a normal mood and affect.    LabResultsLast48Hours  Results for orders placed or performed during the hospital encounter of 11/22/17 (from the past 48 hour(s))  Glucose, capillary     Status: Abnormal   Collection Time: 11/22/17 12:21 PM  Result Value Ref Range   Glucose-Capillary 133 (H) 65 - 99 mg/dL  Basic metabolic panel     Status: Abnormal   Collection Time: 11/22/17 12:24 PM  Result Value Ref Range   Sodium 141 135 - 145 mmol/L   Potassium 4.2 3.5 - 5.1 mmol/L   Chloride 110 101 - 111 mmol/L   CO2 22 22 - 32 mmol/L   Glucose, Bld 153 (H) 65 - 99 mg/dL   BUN 20 6 - 20 mg/dL   Creatinine, Ser 1.28 (H) 0.61 - 1.24 mg/dL   Calcium 8.8 (L) 8.9 - 10.3 mg/dL   GFR calc non Af Amer 54 (L) >60 mL/min   GFR calc Af Amer >60 >60 mL/min    Comment: (NOTE) The eGFR has been calculated using the CKD EPI equation. This calculation has not been validated in all clinical situations. eGFR's persistently <60 mL/min signify possible Chronic Kidney Disease.    Anion gap 9 5 - 15    Comment: Performed at [facility] [address], [place], [place] [address]  CBC     Status: Abnormal   Collection Time: 11/22/17 12:24 PM  Result Value Ref Range   WBC 8.1 4.0 - 10.5 K/uL   RBC 4.14 (L) 4.22 - 5.81 MIL/uL   Hemoglobin 12.2 (L) 13.0 - 17.0 g/dL   HCT 37.4 (L) 39.0 - 52.0 %   MCV 90.3 78.0 - 100.0 fL   MCH 29.5 26.0 - 34.0 pg   MCHC 32.6 30.0 - 36.0 g/dL   RDW 12.7 11.5 - 15.5 %   Platelets 216 150 - 400 K/uL    Comment: Performed at [facility], [place] [address] [place], [place] [address]  Hemoglobin A1c     Status: Abnormal   Collection Time: 11/22/17 12:24 PM  Result  Value Ref Range   Hgb A1c MFr Bld 6.7 (H) 4.8 - 5.6 %    Comment: (NOTE) Pre diabetes:          5.7%-6.4% Diabetes:              >6.4% Glycemic control for   <7.0% adults with diabetes    Mean Plasma Glucose 145.59 mg/dL    Comment: Performed at [facility] [address] [place], [place] [address]  Surgical pcr screen     Status: None   Collection Time: 11/22/17  1:29 PM  Result Value Ref Range   MRSA, PCR NEGATIVE NEGATIVE  Staphylococcus aureus NEGATIVE NEGATIVE    Comment: (NOTE) The Xpert SA Assay (FDA approved for NASAL specimens in patients 71 years of age and older), is one component of a comprehensive surveillance program. It is not intended to diagnose infection nor to guide or monitor treatment. Performed at [facility], [place] [address], [place], [place] [address]   Glucose, capillary     Status: Abnormal   Collection Time: 11/22/17  2:55 PM  Result Value Ref Range   Glucose-Capillary 148 (H) 65 - 99 mg/dL  Glucose, capillary     Status: Abnormal   Collection Time: 11/22/17  5:13 PM  Result Value Ref Range   Glucose-Capillary 188 (H) 65 - 99 mg/dL  Glucose, capillary     Status: Abnormal   Collection Time: 11/23/17  8:08 AM  Result Value Ref Range   Glucose-Capillary 147 (H) 65 - 99 mg/dL  Glucose, capillary     Status: Abnormal   Collection Time: 11/23/17 11:50 AM  Result Value Ref Range   Glucose-Capillary 130 (H) 65 - 99 mg/dL  Glucose, capillary     Status: Abnormal   Collection Time: 11/23/17  4:22 PM  Result Value Ref Range   Glucose-Capillary 111 (H) 65 - 99 mg/dL  Glucose, capillary     Status: Abnormal   Collection Time: 11/23/17 11:00 PM  Result Value Ref Range   Glucose-Capillary 139 (H) 65 - 99 mg/dL     ImagingResults(Last48hours)  No results found.       Medical Problem List and Plan: 1.  Decreased functional mobility secondary to left BKA 11/22/2017.  Postoperative wound VAC              -admit to inpatient rehab 2.  DVT Prophylaxis/Anticoagulation: SCDs right lower extremity 3. Pain Management: Hydrocodone and Robaxin as needed 4. Mood: Provide emotional support 5. Neuropsych: This patient is capable of making decisions on his own behalf. 6. Skin/Wound Care: Routine skin checks             -vac for about 7 days             -limb guard for protection/promote knee extension 7. Fluids/Electrolytes/Nutrition: Routine in and outs with follow-up chemistries 8.  Diabetes mellitus with peripheral neuropathy.  Hemoglobin A1c 6.7.  NovoLog 4 units 3 times daily, Levemir 30 units nightly, Glucophage 500 mg twice daily.  Check blood sugars before meals and at bedtime.  Optimize glucose control to promote wound healing 9.  CKD stage III.  Follow-up chemistries upon admit 10.  Constipation.  Laxative assistance  Post Admission Physician Evaluation: 1. Functional deficits secondary  to left BKA. 2. Patient is admitted to receive collaborative, interdisciplinary care between the physiatrist, rehab nursing staff, and therapy team. 3. Patient's level of medical complexity and substantial therapy needs in context of that medical necessity cannot be provided at a lesser intensity of care such as a SNF. 4. Patient has experienced substantial functional loss from his/her baseline which was documented above under the "Functional History" and "Functional Status" headings.  Judging by the patient's diagnosis, physical exam, and functional history, the patient has potential for functional progress which will result in measurable gains while on inpatient rehab.  These gains will be of substantial and practical use upon discharge  in facilitating mobility and self-care at the household level. 5. Physiatrist will provide 24 hour management of medical needs as well as oversight of the therapy plan/treatment and provide guidance as appropriate regarding the interaction of the  two. 6. The Preadmission  Screening has been reviewed and patient status is unchanged unless otherwise stated above. 7. 24 hour rehab nursing will assist with bladder management, bowel management, safety, skin/wound care, disease management, medication administration, pain management and patient education  and help integrate therapy concepts, techniques,education, etc. 8. PT will assess and treat for/with: Lower extremity strength, range of motion, stamina, balance, functional mobility, safety, adaptive techniques and equipment, pre-prosthetic education, pain control.   Goals are: mod I with and without walker. 9. OT will assess and treat for/with: ADL's, functional mobility, safety, upper extremity strength, adaptive techniques and equipment, pre-prosthetic education, ego support.   Goals are: mod I. Therapy may not yet proceed with showering this patient. 10. SLP will assess and treat for/with: n/a.  Goals are: n/a. 11. Case Management and Social Worker will assess and treat for psychological issues and discharge planning. 12. Team conference will be held weekly to assess progress toward goals and to determine barriers to discharge. 13. Patient will receive at least 3 hours of therapy per day at least 5 days per week. 14. ELOS: 5-7 days       15. Prognosis:  excellent      I have personally performed a face to face diagnostic evaluation of this patient and formulated the key components of the plan.  Additionally, I have personally reviewed laboratory data, imaging studies, as well as relevant notes and concur with the physician assistant's documentation above.  Meredith Staggers, MD, FAAPMR   Lavon Paganini Osseo, PA-C 11/24/2017

## 2017-11-24 NOTE — Progress Notes (Signed)
Nanine Means, OT  Rehab Admission Coordinator  Physical Medicine and Rehabilitation  PMR Pre-admission  Signed  Date of Service:  11/24/2017 12:37 PM       Related encounter: Admission (Current) from 11/22/2017 in [facility]      Signed           PMR Admission Coordinator Pre-Admission Assessment  Patient: Kevin Bradford is an 72 y.o., male MRN: [id number] DOB: 01-Jan-1946 Height:  (188 cm) Weight: 100.7 kg (222 lb)                                                                                                                                                  Insurance Information HMO:     PPO:      PCP:      IPA:      80/20: YES     OTHER: Not Medicare Advantage Plan  PRIMARY: Medicare A and B      Policy#: [id number]      Subscriber: Patient CM Name:       Phone#:      Fax#:  Pre-Cert#:       Employer:  Benefits:  Phone #: Online     Name: 11/24/17 Eff. 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 via [phone] with Reference #: [id number] with Authorization Not Required  Medicaid Application Date:       Case Manager:  Disability Application Date:       Case Worker:   Emergency Contact Information         Contact Information    Name Relation Home Work Mobile   [name] Spouse   [phone]     Current Medical History  Patient Admitting Diagnosis: Left BKA History of Present Illness: Kevin Bradford is a 72 year old right-handed male with history of CKD stage III, diabetes mellitus with peripheral neuropathy,  peripheral vascular disease with left transmetatarsal amputation 07/19/2017 and revision 08/16/2017.Presented 11/22/2017 with progressive wound dehiscence as well as necrosis ulceration osteomyelitis lateral left foot. Underwent transtibial amputation on 11/22/2017 per Dr. Lajoyce Corners. Hospital course pain management. Application postoperatively of wound VAC.Patient is to be admitted for a comprehensive rehab program.  Past Medical History      Past Medical History:  Diagnosis Date  .  Arthritis    "joint pain" (08/16/2017)  . Cellulitis and abscess of leg 10/18/2014  . CKD (chronic kidney disease) stage 3, GFR 30-59 ml/min (HCC)    creatinine increases with antibiotics per pt, no known kidney disease per patient  . Dehiscence of closure of skin, subsequent encounter   . Diabetic foot ulcer (HCC)    left  . Diabetic peripheral neuropathy (HCC)   . GERD (gastroesophageal reflux disease)   . Osteomyelitis (HCC)    left transmetatarsal   . Type II diabetes mellitus (HCC)     Family History  family history includes Dementia in his mother.  Prior Rehab/Hospitalizations:  Has the patient had major surgery during 100 days prior to admission? Yes  Current Medications   Current Facility-Administered Medications:  .  0.9 %  sodium chloride infusion, , Intravenous, Continuous, Nadara Mustard, MD, Last Rate: 10 mL/hr at 11/22/17 1630 .  acetaminophen (TYLENOL) tablet 325-650 mg, 325-650 mg, Oral, Q6H PRN, Nadara Mustard, MD .  aspirin EC tablet 325 mg, 325 mg, Oral, Daily, Nadara Mustard, MD, 325 mg at 11/24/17 0803 .  bisacodyl (DULCOLAX) suppository 10 mg, 10 mg, Rectal, Daily PRN, Nadara Mustard, MD .  docusate sodium (COLACE) capsule 100 mg, 100 mg, Oral, BID, Nadara Mustard, MD, 100 mg at 11/24/17 0803 .  HYDROcodone-acetaminophen (NORCO) 7.5-325 MG per tablet 1-2 tablet, 1-2 tablet, Oral, Q4H PRN, Nadara Mustard, MD, 2 tablet at 11/24/17 1053 .  HYDROcodone-acetaminophen  (NORCO/VICODIN) 5-325 MG per tablet 1-2 tablet, 1-2 tablet, Oral, Q4H PRN, Nadara Mustard, MD, 2 tablet at 11/23/17 1838 .  insulin aspart (novoLOG) injection 0-15 Units, 0-15 Units, Subcutaneous, TID WC, Nadara Mustard, MD, 2 Units at 11/24/17 0805 .  insulin aspart (novoLOG) injection 4 Units, 4 Units, Subcutaneous, TID WC, Nadara Mustard, MD, 4 Units at 11/24/17 1230 .  insulin detemir (LEVEMIR) injection 30 Units, 30 Units, Subcutaneous, QHS, Nadara Mustard, MD, 30 Units at 11/23/17 2125 .  magnesium citrate solution 1 Bottle, 1 Bottle, Oral, Once PRN, Nadara Mustard, MD .  metFORMIN (GLUCOPHAGE) tablet 500 mg, 500 mg, Oral, BID WC, Nadara Mustard, MD, 500 mg at 11/24/17 0803 .  methocarbamol (ROBAXIN) tablet 500 mg, 500 mg, Oral, Q6H PRN, 500 mg at 11/24/17 0535 **OR** methocarbamol (ROBAXIN) 500 mg in dextrose 5 % 50 mL IVPB, 500 mg, Intravenous, Q6H PRN, Nadara Mustard, MD .  metoCLOPramide (REGLAN) tablet 5-10 mg, 5-10 mg, Oral, Q8H PRN **OR** metoCLOPramide (REGLAN) injection 5-10 mg, 5-10 mg, Intravenous, Q8H PRN, Nadara Mustard, MD .  morphine 2 MG/ML injection 0.5-1 mg, 0.5-1 mg, Intravenous, Q2H PRN, Nadara Mustard, MD, 1 mg at 11/22/17 1950 .  ondansetron (ZOFRAN) tablet 4 mg, 4 mg, Oral, Q6H PRN **OR** ondansetron (ZOFRAN) injection 4 mg, 4 mg, Intravenous, Q6H PRN, Nadara Mustard, MD, 4 mg at 11/22/17 1635 .  pantoprazole (PROTONIX) EC tablet 40 mg, 40 mg, Oral, Daily, Nadara Mustard, MD, 40 mg at 11/24/17 0803 .  pentoxifylline (TRENTAL) CR tablet 400 mg, 400 mg, Oral, TID WC, Nadara Mustard, MD, 400 mg at 11/24/17 1230 .  polyethylene glycol (MIRALAX / GLYCOLAX) packet 17 g, 17 g, Oral, Daily PRN, Nadara Mustard, MD  Patients Current Diet:       Diet Order           Diet - low sodium heart healthy        Diet Carb Modified Fluid consistency:  Thin; Room service appropriate? 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 Walker use after most recent surgeries due to WB status. Otherwise no AD use.   Prior Functional Level Prior Function Level of Independence: Independent  Self Care: Did the patient need help bathing, dressing, using the toilet or eating?  Independent  Indoor Mobility: Did the patient need assistance with walking from room to room (with or without device)? Independent  Stairs: Did the patient need assistance with internal or external stairs (with or without device)? Independent  Functional Cognition: Did the patient need help planning regular tasks such as shopping or remembering to take medications? Independent  Current Functional Level Cognition  Overall Cognitive Status: Within Functional Limits for tasks assessed Orientation Level: Oriented X4    Extremity Assessment (includes Sensation/Coordination)  Upper Extremity Assessment: Overall WFL for tasks assessed  Lower Extremity Assessment: Overall WFL for tasks assessed, LLE deficits/detail LLE Deficits / Details: wound VAC in place; pt currently with full knee flexion/extension ROM     ADLs  Overall ADL's : Needs assistance/impaired Eating/Feeding: Sitting, Independent Grooming: Set up, Sitting Upper Body Bathing: Set up, Sitting Lower Body Bathing: Maximal assistance, Cueing for safety, Cueing for sequencing, Sit to/from stand, Sitting/lateral leans Upper Body Dressing : Set  up, Sitting Lower Body Dressing: Maximal assistance, Sit to/from stand, Cueing for sequencing Toilet Transfer: Moderate assistance, RW, Stand-pivot Toileting- Clothing Manipulation and Hygiene: Maximal assistance, Sit to/from stand, Cueing for sequencing, Cueing for safety General ADL Comments: pt has great family support.  pt will benefit from inpt rehab     Mobility  Overal bed mobility: Needs Assistance Bed Mobility: Supine to Sit Supine to sit: Supervision Sit to supine: Min guard General bed mobility comments: supervision for safety    Transfers  Overall transfer level: Needs assistance Equipment used: Rolling walker (2 wheeled) Transfers: Sit to/from Stand, Stand Pivot Transfers Sit to Stand: From elevated surface, Min guard Stand pivot transfers: Min guard General transfer comment: VC for hand placement and sequencing.    Ambulation / Gait / Stairs / Wheelchair Mobility  Ambulation/Gait Ambulation/Gait assistance: Hydrographic surveyor (Feet): 5 Feet Assistive device: Rolling walker (2 wheeled) Gait Pattern/deviations: (hop-to on R LE) General Gait Details: pt a bit anxious with ambulation, reported feeling "light-headed" and with "narrowed vision"; limited to 5' with RW Gait velocity: decreased Gait velocity interpretation: <1.8 ft/sec, indicate of risk for recurrent falls    Posture / Balance Balance Overall balance assessment: Needs assistance Sitting-balance support: No upper extremity supported Sitting balance-Leahy Scale: Good Standing balance support: Bilateral upper extremity supported Standing balance-Leahy Scale: Poor Standing balance comment: reliant on bilateral UEs on RW    Special needs/care consideration BiPAP/CPAP: No CPM: No Continuous Drip IV: No Dialysis: No        Days: NA Life Vest: No Oxygen: No Special Bed: No Trach Size: No Wound Vac (area): Yes      Location: LLE  Skin: LLE surgical site                               Location: LLE Bowel mgmt: Continent per pt report; 11/21/17 Last BM Bladder mgmt:Continent; use of urinal  Diabetic mgmt: Yes     Previous Home Environment Living Arrangements: Spouse/significant other Available Help at Discharge: Family, Available 24 hours/day Type of Home: House Home Layout: Two level, 1/2 bath on main level Alternate Level Stairs-Number of Steps: flight, bedroom is upstairs but he can stay on the first floor  Home Access: Stairs to enter Entrance Stairs-Number of Steps: 1 Bathroom Toilet: Handicapped height Home Care Services: No  Discharge Living Setting Plans for Discharge Living Setting: Patient's home Type of Home at Discharge: House Discharge Home Layout: Two level, 1/2 bath on main level(pt can live on 1st floor with pull out couch if need be) Alternate Level Stairs-Number of Steps: full flight of stairs for 2nd level Discharge Home Access: Stairs to enter Entrance Stairs-Number of Steps: 1 Discharge Bathroom Shower/Tub: Tub/shower unit Discharge Bathroom Toilet: Handicapped height(BSC frame over commode) Discharge Bathroom Accessibility: Yes How Accessible: Accessible via walker Does the patient have any problems obtaining your medications?: No  Social/Family/Support Systems Patient Roles: Spouse, Parent Contact Information: wife Anticipated Caregiver: wife Randa Evens Anticipated Industrial/product designer Information: Randa Evens: [phone] Ability/Limitations of Caregiver: pt can assist as needed per her report Caregiver Availability: 24/7(as needed) Discharge Plan Discussed with Primary Caregiver: Yes Is Caregiver In Agreement with Plan?: Yes Does Caregiver/Family have Issues with Lodging/Transportation while Pt is in Rehab?: No   Goals/Additional Needs Patient/Family Goal for Rehab: Mod I OT and PT; no SLP Expected length of stay: 6-10 days Cultural Considerations: NA Dietary Needs: Carb Modified Diet; thin liquids; Medium calorie level  1600-2000 Equipment Needs: TBD at next level of care Special Service Needs: NA Additional Information: NA Pt/Family Agrees to Admission and willing to participate: Yes Program Orientation Provided & Reviewed with Pt/Caregiver Including Roles  & Responsibilities: Yes(wife and patient at the bedside)  Barriers to Discharge: Home environment access/layout  Barriers to Discharge Comments: Pt lives in 2 story home with full flight of stairs to get to bedroom and full bath; able to sleep on ground level with 1/2 bath if needed   Decrease burden of Care through IP rehab admission: NA   Possible need for SNF placement upon discharge: Not anticipated   Patient Condition: This patient's condition remains as documented in the consult dated 11/23/17, in which the Rehabilitation Physician determined and documented that the patient's condition is appropriate for intensive rehabilitative care in an inpatient rehabilitation facility. Will admit to inpatient rehab today.  Preadmission Screen Completed By:  Nanine Means, 11/24/2017 1:07 PM ______________________________________________________________________   Discussed status with Dr. Riley Kill on 11/24/17 at 12:58PM and received telephone approval for admission today.  Admission Coordinator:  Nanine Means, time 12:58PM/Date 11/24/17             Cosigned by: Ranelle Oyster, MD at 11/24/2017 1:18 PM  Revision History

## 2017-11-24 NOTE — Plan of Care (Signed)
  Problem: Pain Managment: Goal: General experience of comfort will improve Outcome: Progressing   

## 2017-11-24 NOTE — Progress Notes (Signed)
Inpatient Rehabilitation-Admissions Coordinator   Met with pt and his wife at the bedside to discuss inpatient rehab. Provided family and pt with brochure, discussed expected length of stay, expectations for rehab, and anticipated functional level at DC. Pt and wife interested in CIR. I will contact Dr Sharol Given for arrangements for DC to CIR today. RN and CM made aware of plans. Please call for questions.   Jhonnie Garner, OTR/L  Rehab Admissions Coordinator  [phone] 11/24/2017 11:31 AM

## 2017-11-24 NOTE — Progress Notes (Signed)
Physical Medicine and Rehabilitation Consult Reason for Consult: Decreased functional mobility Referring Physician: [facility]   HPI: Kevin Bradford is a 72 y.o. right-handed male with history of CKD stage III, diabetes mellitus and peripheral vascular disease with left transmetatarsal amputation 07/19/2017 with revision 08/16/2017.  Per report patient lives with his wife.  Two-level home with bedroom on Main and one-step to entry.  Wife can assist as needed.  Presented 11/22/2017 with progressive wound dehiscence left lower extremity with foul-smelling odor.  Underwent transmetatarsal amputation 11/22/2017 per Dr. Sharol Given.  Hospital course pain management.  Physical and occupational therapy evaluations pending.  MD has requested physical medicine rehab consult.   Review of Systems  Constitutional: Negative for chills and fever.  HENT: Negative for hearing loss.   Eyes: Negative for blurred vision and double vision.  Respiratory: Negative for cough and shortness of breath.   Cardiovascular: Positive for leg swelling. Negative for chest pain.  Gastrointestinal: Positive for constipation. Negative for nausea and vomiting.       GERD  Genitourinary: Negative for dysuria, flank pain and hematuria.  Musculoskeletal: Positive for joint pain and myalgias.  Skin: Negative for rash.  Neurological: Positive for sensory change.  All other systems reviewed and are negative.      Past Medical History:  Diagnosis Date  . Arthritis    "joint pain" (08/16/2017)  . Cellulitis and abscess of leg 10/18/2014  . CKD (chronic kidney disease) stage 3, GFR 30-59 ml/min (HCC)    creatinine increases with antibiotics per pt, no known kidney disease per patient  . Dehiscence of closure of skin, subsequent encounter   . Diabetic foot ulcer ([place])    left  . Diabetic peripheral neuropathy ([place])   . GERD (gastroesophageal reflux disease)   . Osteomyelitis ([place])    left transmetatarsal   . Type II diabetes  mellitus ([place])         Past Surgical History:  Procedure Laterality Date  . AMPUTATION Left 07/19/2017   Procedure: LEFT TRANSMETATARSAL AMPUTATION;  Surgeon: Newt Minion, MD;  Location: [place];  Service: Orthopedics;  Laterality: Left;  . AMPUTATION Left 08/16/2017   REVISION LEFT TRANSMETATARSAL AMPUTATION  . CATARACT EXTRACTION W/ INTRAOCULAR LENS  IMPLANT, BILATERAL  ~ 2016  . I&D EXTREMITY Left 10/17/2014   Procedure: IRRIGATION AND DEBRIDEMENT EXTREMITY LEFT FOOT;  Surgeon: Newt Minion, MD;  Location: [place];  Service: Orthopedics;  Laterality: Left;  . STUMP REVISION Left 08/16/2017   Procedure: REVISION LEFT TRANSMETATARSAL AMPUTATION;  Surgeon: Newt Minion, MD;  Location: [place];  Service: Orthopedics;  Laterality: Left;  . TOE AMPUTATION Right 2013   5th toe        Family History  Problem Relation Age of Onset  . Dementia Mother    Social History:  reports that he has never smoked. He has never used smokeless tobacco. He reports that he drinks alcohol. He reports that he does not use drugs. Allergies:      Allergies  Allergen Reactions  . Oxycodone Shortness Of Breath         Medications Prior to Admission  Medication Sig Dispense Refill  . insulin aspart (NOVOLOG) 100 UNIT/ML FlexPen Inject 6 Units into the skin 3 (three) times daily with meals. (Patient taking differently: Inject 8 Units into the skin 3 (three) times daily after meals. ) 15 mL 1  . Insulin Detemir (LEVEMIR) 100 UNIT/ML Pen Inject 40 Units into the skin daily at 10 pm. (Patient taking differently: Inject  40 Units into the skin at bedtime. ) 15 mL 1  . metFORMIN (GLUCOPHAGE) 500 MG tablet Take 500 mg by mouth 2 (two) times daily with a meal.     . nitroGLYCERIN (NITRODUR - DOSED IN MG/24 HR) 0.2 mg/hr patch Place 1 patch (0.2 mg total) onto the skin daily. (Patient taking differently: Place 0.2 mg onto the skin daily. Applied to left foot) 30 patch 12  . Omega-3 Fatty Acids (OMEGA 3 PO)  Take 1 capsule by mouth 2 (two) times a week.     . pentoxifylline (TRENTAL) 400 MG CR tablet Take 1 tablet (400 mg total) by mouth 3 (three) times daily with meals. 90 tablet 3  . blood glucose meter kit and supplies KIT Dispense based on patient and insurance preference. Use up to four times daily as directed. (FOR ICD-9 250.00, 250.01). 1 each 0  . HYDROcodone-acetaminophen (NORCO/VICODIN) 5-325 MG tablet Take 1 tablet by mouth every 4 (four) hours as needed for moderate pain. (Patient not taking: Reported on 11/21/2017) 60 tablet 0  . Insulin Pen Needle 32G X 4 MM MISC Use with insulin pens to dispense insulin as directed 100 each 1  . sulfamethoxazole-trimethoprim (BACTRIM DS,SEPTRA DS) 800-160 MG tablet Take 1 tablet by mouth 2 (two) times daily. (Patient not taking: Reported on 11/21/2017) 60 tablet 0    Home:    Functional History: Functional Status:  Mobility:  ADL:  Cognition: Cognition Orientation Level: Oriented X4  Blood pressure 127/89, pulse 60, temperature 97.7 F (36.5 C), temperature source Oral, resp. rate 15, height '6\' 2"'$  (1.88 m), weight 100.7 kg (222 lb), SpO2 100 %. Physical Exam  Vitals reviewed. Constitutional: He is oriented to person, place, and time. He appears well-developed and well-nourished.  HENT:  Head: Normocephalic.  Eyes: Pupils are equal, round, and reactive to light.  Neck: Normal range of motion.  Cardiovascular: Normal rate.  Respiratory: Effort normal.  GI: Soft.  Musculoskeletal:  Normal muscle tone. Left Bk in Vac  Neurological: He is alert and oriented to person, place, and time. No cranial nerve deficit.  Strength 5/5 UE. RLE 4-5/5. Able to lift Left leg agst gravity  Psychiatric: He has a normal mood and affect. His behavior is normal. Thought content normal.  Skin.  Wound VAC in place to amputation site appropriately tender  LabResultsLast24Hours       Results for orders placed or performed during the hospital encounter  of 11/22/17 (from the past 24 hour(s))  Glucose, capillary     Status: Abnormal   Collection Time: 11/22/17 12:21 PM  Result Value Ref Range   Glucose-Capillary 133 (H) 65 - 99 mg/dL  Basic metabolic panel     Status: Abnormal   Collection Time: 11/22/17 12:24 PM  Result Value Ref Range   Sodium 141 135 - 145 mmol/L   Potassium 4.2 3.5 - 5.1 mmol/L   Chloride 110 101 - 111 mmol/L   CO2 22 22 - 32 mmol/L   Glucose, Bld 153 (H) 65 - 99 mg/dL   BUN 20 6 - 20 mg/dL   Creatinine, Ser 1.28 (H) 0.61 - 1.24 mg/dL   Calcium 8.8 (L) 8.9 - 10.3 mg/dL   GFR calc non Af Amer 54 (L) >60 mL/min   GFR calc Af Amer >60 >60 mL/min   Anion gap 9 5 - 15  CBC     Status: Abnormal   Collection Time: 11/22/17 12:24 PM  Result Value Ref Range   WBC 8.1 4.0 -  10.5 K/uL   RBC 4.14 (L) 4.22 - 5.81 MIL/uL   Hemoglobin 12.2 (L) 13.0 - 17.0 g/dL   HCT 37.4 (L) 39.0 - 52.0 %   MCV 90.3 78.0 - 100.0 fL   MCH 29.5 26.0 - 34.0 pg   MCHC 32.6 30.0 - 36.0 g/dL   RDW 12.7 11.5 - 15.5 %   Platelets 216 150 - 400 K/uL  Hemoglobin A1c     Status: Abnormal   Collection Time: 11/22/17 12:24 PM  Result Value Ref Range   Hgb A1c MFr Bld 6.7 (H) 4.8 - 5.6 %   Mean Plasma Glucose 145.59 mg/dL  Surgical pcr screen     Status: None   Collection Time: 11/22/17  1:29 PM  Result Value Ref Range   MRSA, PCR NEGATIVE NEGATIVE   Staphylococcus aureus NEGATIVE NEGATIVE  Glucose, capillary     Status: Abnormal   Collection Time: 11/22/17  2:55 PM  Result Value Ref Range   Glucose-Capillary 148 (H) 65 - 99 mg/dL  Glucose, capillary     Status: Abnormal   Collection Time: 11/22/17  5:13 PM  Result Value Ref Range   Glucose-Capillary 188 (H) 65 - 99 mg/dL     ImagingResults(Last48hours)  No results found.     Assessment/Plan: Diagnosis: left BKA 1. Does the need for close, 24 hr/day medical supervision in concert with the patient's rehab needs make it unreasonable for this  patient to be served in a less intensive setting? Yes, potentially 2. Co-Morbidities requiring supervision/potential complications: CKD, DM 3. Due to bladder management, bowel management, safety, skin/wound care, disease management, medication administration, pain management and patient education, does the patient require 24 hr/day rehab nursing? Yes, potentially 4. Does the patient require coordinated care of a physician, rehab nurse, PT (1-2 hrs/day, 5 days/week) and OT (1-2 hrs/day, 5 days/week) to address physical and functional deficits in the context of the above medical diagnosis(es)? Yes and Potentially Addressing deficits in the following areas: balance, endurance, locomotion, strength, transferring, bowel/bladder control, bathing, dressing, feeding, grooming, toileting and psychosocial support 5. Can the patient actively participate in an intensive therapy program of at least 3 hrs of therapy per day at least 5 days per week? Potentially 6. The potential for patient to make measurable gains while on inpatient rehab is good 7. Anticipated functional outcomes upon discharge from inpatient rehab are modified independent  with PT, modified independent with OT, n/a with SLP. 8. Estimated rehab length of stay to reach the above functional goals is: potentially 6-10 days 9. Anticipated D/C setting: Home 10. Anticipated post D/C treatments: HH therapy and Outpatient therapy 11. Overall Rehab/Functional Prognosis: excellent  RECOMMENDATIONS: This patient's condition is appropriate for continued rehabilitative care in the following setting: CIR Patient has agreed to participate in recommended program. Yes Note that insurance prior authorization may be required for reimbursement for recommended care.  Comment: Rehab Admissions Coordinator to follow up.  Thanks,  Meredith Staggers, MD, Mellody Drown  I have personally performed a face to face diagnostic evaluation of this patient. Additionally, I  have reviewed and concur with the physician assistant's documentation above.     Lavon Paganini Angiulli, PA-C 11/23/2017

## 2017-11-24 NOTE — H&P (Signed)
Physical Medicine and Rehabilitation Admission H&P    Chief complaint: Stump pain   HPI: Kevin Bradford is a 72 year old right-handed male with history of CKD stage III, diabetes mellitus with peripheral neuropathy, peripheral vascular disease with left transmetatarsal amputation 07/19/2017 and revision 08/16/2017.  Per report patient lives with his wife.  2 level home with bedroom on Main level and one-step entry.  Wife assists as needed.  Presented 11/22/2017 with progressive wound dehiscence as well as necrosis ulceration osteomyelitis lateral left foot.  Underwent transmetatarsal 11/22/2017 per Dr. Sharol Given.  Hospital course pain management.  Application postoperatively of wound VAC.  Physical and occupational therapy evaluations completed 11/23/2017 with recommendations of physical medicine rehab consult.  Patient was admitted for a comprehensive rehab program.  Review of Systems  Constitutional: Negative for chills and fever.  HENT: Negative for hearing loss.   Eyes: Negative for blurred vision and double vision.  Respiratory: Negative for cough and shortness of breath.   Cardiovascular: Positive for leg swelling. Negative for palpitations.  Gastrointestinal: Positive for constipation. Negative for nausea and vomiting.       GERD  Genitourinary: Negative for dysuria, flank pain and hematuria.  Musculoskeletal: Positive for joint pain and myalgias.   Past Medical History:  Diagnosis Date  . Arthritis    "joint pain" (08/16/2017)  . Cellulitis and abscess of leg 10/18/2014  . CKD (chronic kidney disease) stage 3, GFR 30-59 ml/min (HCC)    creatinine increases with antibiotics per pt, no known kidney disease per patient  . Dehiscence of closure of skin, subsequent encounter   . Diabetic foot ulcer ([place])    left  . Diabetic peripheral neuropathy ([place])   . GERD (gastroesophageal reflux disease)   . Osteomyelitis ([place])    left transmetatarsal   . Type II diabetes mellitus ([place])    Past  Surgical History:  Procedure Laterality Date  . AMPUTATION Left 07/19/2017   Procedure: LEFT TRANSMETATARSAL AMPUTATION;  Surgeon: Newt Minion, MD;  Location: [place];  Service: Orthopedics;  Laterality: Left;  . AMPUTATION Left 08/16/2017   REVISION LEFT TRANSMETATARSAL AMPUTATION  . AMPUTATION Left 11/22/2017   Procedure: LEFT BELOW KNEE AMPUTATION;  Surgeon: Newt Minion, MD;  Location: [facility];  Service: Orthopedics;  Laterality: Left;  . CATARACT EXTRACTION W/ INTRAOCULAR LENS  IMPLANT, BILATERAL  ~ 2016  . I&D EXTREMITY Left 10/17/2014   Procedure: IRRIGATION AND DEBRIDEMENT EXTREMITY LEFT FOOT;  Surgeon: Newt Minion, MD;  Location: [facility];  Service: Orthopedics;  Laterality: Left;  . STUMP REVISION Left 08/16/2017   Procedure: REVISION LEFT TRANSMETATARSAL AMPUTATION;  Surgeon: Newt Minion, MD;  Location: [facility];  Service: Orthopedics;  Laterality: Left;  . TOE AMPUTATION Right 2013   5th toe   Family History  Problem Relation Age of Onset  . Dementia Mother    Social History:  reports that he has never smoked. He has never used smokeless tobacco. He reports that he drinks alcohol. He reports that he does not use drugs. Allergies:  Allergies  Allergen Reactions  . Oxycodone Shortness Of Breath   Medications Prior to Admission  Medication Sig Dispense Refill  . insulin aspart (NOVOLOG) 100 UNIT/ML FlexPen Inject 6 Units into the skin 3 (three) times daily with meals. (Patient taking differently: Inject 8 Units into the skin 3 (three) times daily after meals. ) 15 mL 1  . Insulin Detemir (LEVEMIR) 100 UNIT/ML Pen Inject 40 Units into the skin daily at 10 pm. (Patient  taking differently: Inject 40 Units into the skin at bedtime. ) 15 mL 1  . metFORMIN (GLUCOPHAGE) 500 MG tablet Take 500 mg by mouth 2 (two) times daily with a meal.     . nitroGLYCERIN (NITRODUR - DOSED IN MG/24 HR) 0.2 mg/hr patch Place 1 patch (0.2 mg total) onto the skin daily. (Patient taking differently: Place 0.2  mg onto the skin daily. Applied to left foot) 30 patch 12  . Omega-3 Fatty Acids (OMEGA 3 PO) Take 1 capsule by mouth 2 (two) times a week.     . pentoxifylline (TRENTAL) 400 MG CR tablet Take 1 tablet (400 mg total) by mouth 3 (three) times daily with meals. 90 tablet 3  . blood glucose meter kit and supplies KIT Dispense based on patient and insurance preference. Use up to four times daily as directed. (FOR ICD-9 250.00, 250.01). 1 each 0  . HYDROcodone-acetaminophen (NORCO/VICODIN) 5-325 MG tablet Take 1 tablet by mouth every 4 (four) hours as needed for moderate pain. (Patient not taking: Reported on 11/21/2017) 60 tablet 0  . Insulin Pen Needle 32G X 4 MM MISC Use with insulin pens to dispense insulin as directed 100 each 1  . sulfamethoxazole-trimethoprim (BACTRIM DS,SEPTRA DS) 800-160 MG tablet Take 1 tablet by mouth 2 (two) times daily. (Patient not taking: Reported on 11/21/2017) 60 tablet 0    Drug Regimen Review Drug regimen was reviewed and remains appropriate with no significant issues identified  Home: Home Living Family/patient expects to be discharged to:: Inpatient rehab Living Arrangements: Spouse/significant other Available Help at Discharge: Family, Available 24 hours/day Type of Home: House Home Access: Stairs to enter [employer] of Steps: 1 Home Layout: Two level, 1/2 bath on main level Alternate Level Stairs-Number of Steps: flight, bedroom is upstairs but he can stay on the first floor  Bathroom Toilet: Handicapped height Home Equipment: Walker - 2 wheels   Functional History: Prior Function Level of Independence: Independent  Functional Status:  Mobility: Bed Mobility Overal bed mobility: Needs Assistance Bed Mobility: Sit to Supine Supine to sit: Supervision Sit to supine: Min guard General bed mobility comments: supervision for safety Transfers Overall transfer level: Needs assistance Equipment used: Rolling walker (2 wheeled) Transfers: Sit  to/from Stand, [employer] Transfers Sit to Stand: From elevated surface, Mod assist Stand pivot transfers: Min guard General transfer comment: VC for hand placement and sequencing. Ambulation/Gait General Gait Details: pt declined this session    ADL: ADL Overall ADL's : Needs assistance/impaired Eating/Feeding: Sitting, Independent Grooming: Set up, Sitting Upper Body Bathing: Set up, Sitting Lower Body Bathing: Maximal assistance, Cueing for safety, Cueing for sequencing, Sit to/from stand, Sitting/lateral leans Upper Body Dressing : Set up, Sitting Lower Body Dressing: Maximal assistance, Sit to/from stand, Cueing for sequencing Toilet Transfer: Moderate assistance, RW, Stand-pivot Toileting- Clothing Manipulation and Hygiene: Maximal assistance, Sit to/from stand, Cueing for sequencing, Cueing for safety General ADL Comments: pt has great family support.  pt will benefit from inpt rehab   Cognition: Cognition Overall Cognitive Status: Within Functional Limits for tasks assessed Orientation Level: Oriented X4 Cognition Arousal/Alertness: Awake/alert Behavior During Therapy: WFL for tasks assessed/performed Overall Cognitive Status: Within Functional Limits for tasks assessed  Physical Exam: Blood pressure (!) 146/77, pulse 60, temperature 98.6 F (37 C), temperature source Oral, resp. rate 20, height _0  (1.88 m), weight 100.7 kg (222 lb), SpO2 98 %. Physical Exam  Constitutional: He appears well-developed.  HENT:  Head: Normocephalic.  Eyes: Pupils are equal, round, and  reactive to light.  Neck: Normal range of motion.  Cardiovascular: Normal rate. Exam reveals no gallop and no friction rub.  No murmur heard. Respiratory: Effort normal. No respiratory distress. He has no wheezes. He has no rales.  GI: Soft. He exhibits no distension. There is no tenderness.  Musculoskeletal:  Left leg with edema. Tender to touch.  Neurological: He is alert. No cranial nerve  deficit. Coordination normal.  UE 5/5 prox to distal. RLE 4-5/5 prox to distal. LLE: able to lift against gravity  Skin:  BKA site is dressed with wound VAC in place appropriately tender  Psychiatric: He has a normal mood and affect.    Results for orders placed or performed during the hospital encounter of 11/22/17 (from the past 48 hour(s))  Glucose, capillary     Status: Abnormal   Collection Time: 11/22/17 12:21 PM  Result Value Ref Range   Glucose-Capillary 133 (H) 65 - 99 mg/dL  Basic metabolic panel     Status: Abnormal   Collection Time: 11/22/17 12:24 PM  Result Value Ref Range   Sodium 141 135 - 145 mmol/L   Potassium 4.2 3.5 - 5.1 mmol/L   Chloride 110 101 - 111 mmol/L   CO2 22 22 - 32 mmol/L   Glucose, Bld 153 (H) 65 - 99 mg/dL   BUN 20 6 - 20 mg/dL   Creatinine, Ser 1.28 (H) 0.61 - 1.24 mg/dL   Calcium 8.8 (L) 8.9 - 10.3 mg/dL   GFR calc non Af Amer 54 (L) >60 mL/min   GFR calc Af Amer >60 >60 mL/min    Comment: (NOTE) The eGFR has been calculated using the CKD EPI equation. This calculation has not been validated in all clinical situations. eGFR's persistently <60 mL/min signify possible Chronic Kidney Disease.    Anion gap 9 5 - 15    Comment: Performed at [facility] [address], [place], [place] [address]  CBC     Status: Abnormal   Collection Time: 11/22/17 12:24 PM  Result Value Ref Range   WBC 8.1 4.0 - 10.5 K/uL   RBC 4.14 (L) 4.22 - 5.81 MIL/uL   Hemoglobin 12.2 (L) 13.0 - 17.0 g/dL   HCT 37.4 (L) 39.0 - 52.0 %   MCV 90.3 78.0 - 100.0 fL   MCH 29.5 26.0 - 34.0 pg   MCHC 32.6 30.0 - 36.0 g/dL   RDW 12.7 11.5 - 15.5 %   Platelets 216 150 - 400 K/uL    Comment: Performed at [facility], [place] [address], [place], [place] [address]  Hemoglobin A1c     Status: Abnormal   Collection Time: 11/22/17 12:24 PM  Result Value Ref Range   Hgb A1c MFr Bld 6.7 (H) 4.8 - 5.6 %    Comment: (NOTE) Pre diabetes:          5.7%-6.4% Diabetes:               >6.4% Glycemic control for   <7.0% adults with diabetes    Mean Plasma Glucose 145.59 mg/dL    Comment: Performed at [facility] [address] [place], [place] [address]  Surgical pcr screen     Status: None   Collection Time: 11/22/17  1:29 PM  Result Value Ref Range   MRSA, PCR NEGATIVE NEGATIVE   Staphylococcus aureus NEGATIVE NEGATIVE    Comment: (NOTE) The Xpert SA Assay (FDA approved for NASAL specimens in patients 36 years of age  and older), is one component of a comprehensive surveillance program. It is not intended to diagnose infection nor to guide or monitor treatment. Performed at [facility], [place] [address] [place], [place] [address]   Glucose, capillary     Status: Abnormal   Collection Time: 11/22/17  2:55 PM  Result Value Ref Range   Glucose-Capillary 148 (H) 65 - 99 mg/dL  Glucose, capillary     Status: Abnormal   Collection Time: 11/22/17  5:13 PM  Result Value Ref Range   Glucose-Capillary 188 (H) 65 - 99 mg/dL  Glucose, capillary     Status: Abnormal   Collection Time: 11/23/17  8:08 AM  Result Value Ref Range   Glucose-Capillary 147 (H) 65 - 99 mg/dL  Glucose, capillary     Status: Abnormal   Collection Time: 11/23/17 11:50 AM  Result Value Ref Range   Glucose-Capillary 130 (H) 65 - 99 mg/dL  Glucose, capillary     Status: Abnormal   Collection Time: 11/23/17  4:22 PM  Result Value Ref Range   Glucose-Capillary 111 (H) 65 - 99 mg/dL  Glucose, capillary     Status: Abnormal   Collection Time: 11/23/17 11:00 PM  Result Value Ref Range   Glucose-Capillary 139 (H) 65 - 99 mg/dL   No results found.     Medical Problem List and Plan: 1.  Decreased functional mobility secondary to left BKA 11/22/2017.  Postoperative wound VAC  -admit to inpatient rehab 2.  DVT Prophylaxis/Anticoagulation: SCDs right lower extremity 3. Pain Management: Hydrocodone and Robaxin as needed 4. Mood: Provide emotional support 5. Neuropsych: This  patient is capable of making decisions on his own behalf. 6. Skin/Wound Care: Routine skin checks  -vac for about 7 days  -limb guard for protection/promote knee extension 7. Fluids/Electrolytes/Nutrition: Routine in and outs with follow-up chemistries 8.  Diabetes mellitus with peripheral neuropathy.  Hemoglobin A1c 6.7.  NovoLog 4 units 3 times daily, Levemir 30 units nightly, Glucophage 500 mg twice daily.  Check blood sugars before meals and at bedtime.  Optimize glucose control to promote wound healing 9.  CKD stage III.  Follow-up chemistries upon admit 10.  Constipation.  Laxative assistance  Post Admission Physician Evaluation: 1. Functional deficits secondary  to left BKA. 2. Patient is admitted to receive collaborative, interdisciplinary care between the physiatrist, rehab nursing staff, and therapy team. 3. Patient's level of medical complexity and substantial therapy needs in context of that medical necessity cannot be provided at a lesser intensity of care such as a SNF. 4. Patient has experienced substantial functional loss from his/her baseline which was documented above under the "Functional History" and "Functional Status" headings.  Judging by the patient's diagnosis, physical exam, and functional history, the patient has potential for functional progress which will result in measurable gains while on inpatient rehab.  These gains will be of substantial and practical use upon discharge  in facilitating mobility and self-care at the household level. 5. Physiatrist will provide 24 hour management of medical needs as well as oversight of the therapy plan/treatment and provide guidance as appropriate regarding the interaction of the two. 6. The Preadmission Screening has been reviewed and patient status is unchanged unless otherwise stated above. 7. 24 hour rehab nursing will assist with bladder management, bowel management, safety, skin/wound care, disease management, medication  administration, pain management and patient education  and help integrate therapy concepts, techniques,education, etc. 8. PT will assess and treat for/with: Lower extremity strength, range of motion,  stamina, balance, functional mobility, safety, adaptive techniques and equipment, pre-prosthetic education, pain control.   Goals are: mod I with and without walker. 9. OT will assess and treat for/with: ADL's, functional mobility, safety, upper extremity strength, adaptive techniques and equipment, pre-prosthetic education, ego support.   Goals are: mod I. Therapy may not yet proceed with showering this patient. 10. SLP will assess and treat for/with: n/a.  Goals are: n/a. 11. Case Management and Social Worker will assess and treat for psychological issues and discharge planning. 12. Team conference will be held weekly to assess progress toward goals and to determine barriers to discharge. 13. Patient will receive at least 3 hours of therapy per day at least 5 days per week. 14. ELOS: 5-7 days       15. Prognosis:  excellent      I have personally performed a face to face diagnostic evaluation of this patient and formulated the key components of the plan.  Additionally, I have personally reviewed laboratory data, imaging studies, as well as relevant notes and concur with the physician assistant's documentation above.  Meredith Staggers, MD, FAAPMR   Lavon Paganini [place], PA-C 11/24/2017

## 2017-11-24 NOTE — Progress Notes (Signed)
Physical Therapy Treatment Patient Details Name: Kevin Bradford MRN: [id number] DOB: [birth year] Today's Date: 11/24/2017    History of Present Illness Pt is a 72 y/o male s/p L transtibial amputation. PMH including but not limited to CKD and DM.    PT Comments    Pt making steady progress with functional mobility. Pt is a bit anxious with standing and ambulation. PT provided positive encouragement throughout. Plan is for pt to transfer to CIR later this afternoon. PT will continue to follow acutely to progress mobility as tolerated.    Follow Up Recommendations  CIR     Equipment Recommendations  None recommended by PT    Recommendations for Other Services Rehab consult     Precautions / Restrictions Precautions Precautions: Fall Restrictions Weight Bearing Restrictions: Yes LLE Weight Bearing: Non weight bearing    Mobility  Bed Mobility Overal bed mobility: Needs Assistance Bed Mobility: Supine to Sit     Supine to sit: Supervision     General bed mobility comments: supervision for safety  Transfers Overall transfer level: Needs assistance Equipment used: Rolling walker (2 wheeled) Transfers: Sit to/from Stand;Stand Pivot Transfers Sit to Stand: From elevated surface;Min guard Stand pivot transfers: Min guard       General transfer comment: VC for hand placement and sequencing.  Ambulation/Gait Ambulation/Gait assistance: Min guard Ambulation Distance (Feet): 5 Feet Assistive device: Rolling walker (2 wheeled) Gait Pattern/deviations: (hop-to on R LE) Gait velocity: decreased Gait velocity interpretation: <1.8 ft/sec, indicate of risk for recurrent falls General Gait Details: pt a bit anxious with ambulation, reported feeling "light-headed" and with "narrowed vision"; limited to 5' with RW   Stairs             Wheelchair Mobility    Modified Rankin (Stroke Patients Only)       Balance Overall balance assessment: Needs  assistance Sitting-balance support: No upper extremity supported Sitting balance-Leahy Scale: Good     Standing balance support: Bilateral upper extremity supported Standing balance-Leahy Scale: Poor Standing balance comment: reliant on bilateral UEs on RW                            Cognition Arousal/Alertness: Awake/alert Behavior During Therapy: [facility] for tasks assessed/performed;Anxious Overall Cognitive Status: Within Functional Limits for tasks assessed                                        Exercises Amputee Exercises Hip ABduction/ADduction: AROM;Strengthening;Left;10 reps;Seated Straight Leg Raises: AROM;Strengthening;Left;5 reps;Seated    General Comments        Pertinent Vitals/Pain Pain Assessment: Faces Faces Pain Scale: Hurts a little bit Pain Location: L residual limb Pain Descriptors / Indicators: Sore Pain Intervention(s): Monitored during session;Repositioned    Home Living                      Prior Function            PT Goals (current goals can now be found in the care plan section) Acute Rehab PT Goals PT Goal Formulation: With patient Time For Goal Achievement: 12/07/17 Potential to Achieve Goals: Good Progress towards PT goals: Progressing toward goals    Frequency    Min 5X/week      PT Plan Current plan remains appropriate    Co-evaluation  AM-PAC PT "6 Clicks" Daily Activity  Outcome Measure  Difficulty turning over in bed (including adjusting bedclothes, sheets and blankets)?: None Difficulty moving from lying on back to sitting on the side of the bed? : None Difficulty sitting down on and standing up from a chair with arms (e.g., wheelchair, bedside commode, etc,.)?: Unable Help needed moving to and from a bed to chair (including a wheelchair)?: A Little Help needed walking in hospital room?: A Little Help needed climbing 3-5 steps with a railing? : A Lot 6 Click Score:  17    End of Session Equipment Utilized During Treatment: Gait belt Activity Tolerance: Patient tolerated treatment well Patient left: in chair;with call bell/phone within reach;with family/visitor present;Other (comment)(CIR MD present) Nurse Communication: Mobility status PT Visit Diagnosis: Other abnormalities of gait and mobility (R26.89)     Time: 1610-9604 PT Time Calculation (min) (ACUTE ONLY): 23 min  Charges:  $Therapeutic Activity: 23-37 mins                    G Codes:       [place], [place], [place] [phone]    Alessandra Bevels Averie Meiner 11/24/2017, 12:57 PM

## 2017-11-24 NOTE — Discharge Summary (Signed)
Discharge Diagnoses:  Active Problems:   Osteomyelitis of left foot ([place])   Amputated below knee, left ([place])   Surgeries: Procedure(s): LEFT BELOW KNEE AMPUTATION on 11/22/2017    Consultants:   Discharged Condition: Improved  Hospital Course: Kevin Bradford is an 72 y.o. male who was admitted 11/22/2017 with a chief complaint of osteomyelitis and ulceration left foot transmetatarsal amputation, with a final diagnosis of Osteomyelitis Left Transmetatarsal Amputation.  Patient was brought to the operating room on 11/22/2017 and underwent Procedure(s): LEFT BELOW KNEE AMPUTATION.    Patient was given perioperative antibiotics:  Anti-infectives (From admission, onward)   Start     Dose/Rate Route Frequency Ordered Stop   11/22/17 2000  ceFAZolin (ANCEF) IVPB 2g/100 mL premix     2 g 200 mL/hr over 30 Minutes Intravenous Every 6 hours 11/22/17 1652 11/23/17 0330   11/22/17 1630  ceFAZolin (ANCEF) IVPB 2g/100 mL premix  Status:  Discontinued     2 g 200 mL/hr over 30 Minutes Intravenous Every 6 hours 11/22/17 1617 11/22/17 1652   11/22/17 1315  ceFAZolin (ANCEF) IVPB 2g/100 mL premix     2 g 200 mL/hr over 30 Minutes Intravenous On call to O.R. 11/22/17 1200 11/22/17 1346    .  Patient was given sequential compression devices, early ambulation, and aspirin for DVT prophylaxis.  Recent vital signs:  Patient Vitals for the past 24 hrs:  BP Temp Temp src Pulse Resp SpO2  11/24/17 0700 (!) 150/79 98.5 F (36.9 C) Oral 69 18 98 %  11/23/17 2249 (!) 146/77 98.6 F (37 C) Oral 60 20 98 %  11/23/17 1350 119/80 98.1 F (36.7 C) Oral 82 20 97 %  .  Recent laboratory studies: No results found.  Discharge Medications:   Allergies as of 11/24/2017      Reactions   Oxycodone Shortness Of Breath      Medication List    TAKE these medications   blood glucose meter kit and supplies Kit Dispense based on patient and insurance preference. Use up to four times daily as directed. (FOR ICD-9  250.00, 250.01).   HYDROcodone-acetaminophen 5-325 MG tablet Commonly known as:  NORCO/VICODIN Take 1 tablet by mouth every 4 (four) hours as needed for moderate pain.   insulin aspart 100 UNIT/ML FlexPen Commonly known as:  NOVOLOG Inject 6 Units into the skin 3 (three) times daily with meals. What changed:    how much to take  when to take this   Insulin Detemir 100 UNIT/ML Pen Commonly known as:  LEVEMIR Inject 40 Units into the skin daily at 10 pm. What changed:  when to take this   Insulin Pen Needle 32G X 4 MM Misc Use with insulin pens to dispense insulin as directed   metFORMIN 500 MG tablet Commonly known as:  GLUCOPHAGE Take 500 mg by mouth 2 (two) times daily with a meal.   nitroGLYCERIN 0.2 mg/hr patch Commonly known as:  NITRODUR - Dosed in mg/24 hr Place 1 patch (0.2 mg total) onto the skin daily. What changed:  additional instructions   OMEGA 3 PO Take 1 capsule by mouth 2 (two) times a week.   pentoxifylline 400 MG CR tablet Commonly known as:  TRENTAL Take 1 tablet (400 mg total) by mouth 3 (three) times daily with meals.   sulfamethoxazole-trimethoprim 800-160 MG tablet Commonly known as:  BACTRIM DS,SEPTRA DS Take 1 tablet by mouth 2 (two) times daily.       Diagnostic Studies: No results found.  Patient benefited maximally from their hospital stay and there were no complications.     Disposition: Discharge disposition: 02-Transferred to [facility]      Discharge Instructions    Call MD / Call 911   Complete by:  As directed    If you experience chest pain or shortness of breath, CALL 911 and be transported to the hospital emergency room.  If you develope a fever above 101 F, pus (white drainage) or increased drainage or redness at the wound, or calf pain, call your surgeon's office.   Constipation Prevention   Complete by:  As directed    Drink plenty of fluids.  Prune juice may be helpful.  You may use a stool softener, such  as Colace (over the counter) 100 mg twice a day.  Use MiraLax (over the counter) for constipation as needed.   Diet - low sodium heart healthy   Complete by:  As directed    Increase activity slowly as tolerated   Complete by:  As directed    Negative Pressure Wound Therapy - Incisional   Complete by:  As directed    Discontinue the hospital wound VAC pump and attached to the Praveena pump.  Continue wound VAC dressing for 1 week.     Follow-up Information    Newt Minion, MD In 1 week.   Specialty:  Orthopedic Surgery Contact information: [place] [address] [phone]            Signed: Newt Minion 11/24/2017, 1:00 PM

## 2017-11-24 NOTE — Progress Notes (Signed)
Patient ID: Kevin Bradford, male   DOB: 05-18-1946, 72 y.o.   MRN: [id number] Plan for discharge to inpatient rehabilitation.  Patient states that insurance is not an issue.  Plan for discharge once bed is available.  0 drainage of the wound VAC canister.  Transition to the Praveena pump at discharge to inpatient rehab.

## 2017-11-24 NOTE — Progress Notes (Signed)
Nurse on (CIR) and got report on patient, RN removed IV and gave patient pain medication prior to transfer.

## 2017-11-24 NOTE — Progress Notes (Signed)
Received pt. As a new admission,safety plan was explained,fall prevention plan was explained and signed.Rehab routine was explained.

## 2017-11-24 NOTE — PMR Pre-admission (Signed)
PMR Admission Coordinator Pre-Admission Assessment  Patient: Kevin Bradford is an 72 y.o., male MRN: [id number] DOB: April 01, 1946 Height:  (188 cm) Weight: 100.7 kg (222 lb)              Insurance Information HMO:     PPO:      PCP:      IPA:      80/20: YES     OTHER: Not Medicare Advantage Plan  PRIMARY: Medicare A and B      Policy#: [id number]      Subscriber: Patient CM Name:       Phone#:      Fax#:  Pre-Cert#:       Employer:  Benefits:  Phone #: Online     Name: 11/24/17 Eff. 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 via [phone] with Reference #: [id number] with Authorization Not Required  Medicaid Application Date:       Case Manager:  Disability Application Date:       Case Worker:   Emergency Contact Information Contact Information    Name Relation Home Work Mobile   Kevin Bradford Spouse   [phone]     Current Medical History  Patient Admitting Diagnosis: Left BKA History of Present Illness: Kevin Bradford is a 72 year old right-handed male with history of CKD stage III, diabetes mellitus with peripheral neuropathy, peripheral vascular disease with left transmetatarsal amputation 07/19/2017 and revision 08/16/2017. Presented 11/22/2017 with progressive wound dehiscence as well as necrosis ulceration osteomyelitis lateral left foot.  Underwent transtibial amputation on 11/22/2017 per Dr. Lajoyce Corners.  Hospital course pain management.  Application postoperatively of wound VAC. Patient is to be admitted for a comprehensive rehab program.      Past Medical History  Past  Medical History:  Diagnosis Date  . Arthritis    "joint pain" (08/16/2017)  . Cellulitis and abscess of leg 10/18/2014  . CKD (chronic kidney disease) stage 3, GFR 30-59 ml/min (HCC)    creatinine increases with antibiotics per pt, no known kidney disease per patient  . Dehiscence of closure of skin, subsequent encounter   . Diabetic foot ulcer (HCC)    left  . Diabetic peripheral neuropathy (HCC)   . GERD (gastroesophageal reflux disease)   . Osteomyelitis (HCC)    left transmetatarsal   . Type II diabetes mellitus (HCC)     Family History  family history includes Dementia in his mother.  Prior Rehab/Hospitalizations:  Has the patient had major surgery during 100 days prior to admission? Yes  Current Medications   Current Facility-Administered Medications:  .  0.9 %  sodium chloride infusion, , Intravenous, Continuous, Nadara Mustard, MD, Last Rate: 10 mL/hr at 11/22/17 1630 .  acetaminophen (TYLENOL) tablet 325-650 mg, 325-650 mg, Oral, Q6H PRN, Nadara Mustard, MD .  aspirin EC tablet 325 mg, 325 mg, Oral, Daily, Nadara Mustard, MD, 325 mg at 11/24/17 0803 .  bisacodyl (  DULCOLAX) suppository 10 mg, 10 mg, Rectal, Daily PRN, Nadara Mustard, MD .  docusate sodium (COLACE) capsule 100 mg, 100 mg, Oral, BID, Nadara Mustard, MD, 100 mg at 11/24/17 0803 .  HYDROcodone-acetaminophen (NORCO) 7.5-325 MG per tablet 1-2 tablet, 1-2 tablet, Oral, Q4H PRN, Nadara Mustard, MD, 2 tablet at 11/24/17 1053 .  HYDROcodone-acetaminophen (NORCO/VICODIN) 5-325 MG per tablet 1-2 tablet, 1-2 tablet, Oral, Q4H PRN, Nadara Mustard, MD, 2 tablet at 11/23/17 1838 .  insulin aspart (novoLOG) injection 0-15 Units, 0-15 Units, Subcutaneous, TID WC, Nadara Mustard, MD, 2 Units at 11/24/17 0805 .  insulin aspart (novoLOG) injection 4 Units, 4 Units, Subcutaneous, TID WC, Nadara Mustard, MD, 4 Units at 11/24/17 1230 .  insulin detemir (LEVEMIR) injection 30 Units, 30 Units, Subcutaneous, QHS, Nadara Mustard, MD, 30  Units at 11/23/17 2125 .  magnesium citrate solution 1 Bottle, 1 Bottle, Oral, Once PRN, Nadara Mustard, MD .  metFORMIN (GLUCOPHAGE) tablet 500 mg, 500 mg, Oral, BID WC, Nadara Mustard, MD, 500 mg at 11/24/17 0803 .  methocarbamol (ROBAXIN) tablet 500 mg, 500 mg, Oral, Q6H PRN, 500 mg at 11/24/17 0535 **OR** methocarbamol (ROBAXIN) 500 mg in dextrose 5 % 50 mL IVPB, 500 mg, Intravenous, Q6H PRN, Nadara Mustard, MD .  metoCLOPramide (REGLAN) tablet 5-10 mg, 5-10 mg, Oral, Q8H PRN **OR** metoCLOPramide (REGLAN) injection 5-10 mg, 5-10 mg, Intravenous, Q8H PRN, Nadara Mustard, MD .  morphine 2 MG/ML injection 0.5-1 mg, 0.5-1 mg, Intravenous, Q2H PRN, Nadara Mustard, MD, 1 mg at 11/22/17 1950 .  ondansetron (ZOFRAN) tablet 4 mg, 4 mg, Oral, Q6H PRN **OR** ondansetron (ZOFRAN) injection 4 mg, 4 mg, Intravenous, Q6H PRN, Nadara Mustard, MD, 4 mg at 11/22/17 1635 .  pantoprazole (PROTONIX) EC tablet 40 mg, 40 mg, Oral, Daily, Nadara Mustard, MD, 40 mg at 11/24/17 0803 .  pentoxifylline (TRENTAL) CR tablet 400 mg, 400 mg, Oral, TID WC, Nadara Mustard, MD, 400 mg at 11/24/17 1230 .  polyethylene glycol (MIRALAX / GLYCOLAX) packet 17 g, 17 g, Oral, Daily PRN, Nadara Mustard, MD  Patients Current Diet:  Diet Order           Diet - low sodium heart healthy        Diet Carb Modified Fluid consistency: Thin; Room service appropriate? 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 Walker use after most recent surgeries due to WB status. Otherwise no AD use.   Prior Functional Level Prior Function Level of Independence: Independent  Self Care: Did the patient need help bathing, dressing, using the toilet or eating?  Independent  Indoor Mobility: Did the patient need assistance with walking from room to room (with or without device)? Independent  Stairs: Did the patient need assistance with internal or external stairs (with or without device)? Independent  Functional Cognition: Did the patient need help planning regular tasks  such as shopping or remembering to take medications? Independent  Current Functional Level Cognition  Overall Cognitive Status: Within Functional Limits for tasks assessed Orientation Level: Oriented X4    Extremity Assessment (includes Sensation/Coordination)  Upper Extremity Assessment: Overall WFL for tasks assessed  Lower Extremity Assessment: Overall WFL for tasks assessed, LLE deficits/detail LLE Deficits / Details: wound VAC in place; pt currently with full knee flexion/extension ROM     ADLs  Overall ADL's : Needs assistance/impaired Eating/Feeding: Sitting, Independent Grooming: Set up, Sitting Upper Body Bathing: Set up, Sitting Lower Body Bathing: Maximal assistance, Cueing for safety, Cueing for sequencing, Sit to/from stand, Sitting/lateral leans Upper Body Dressing : Set up, Sitting Lower Body Dressing: Maximal assistance, Sit to/from stand, Cueing for sequencing Toilet Transfer: Moderate assistance, RW, Stand-pivot Toileting- Clothing Manipulation and Hygiene: Maximal assistance, Sit to/from stand, Cueing for sequencing, Cueing for safety General ADL Comments: pt has great family support.  pt will benefit from inpt rehab     Mobility  Overal bed mobility: Needs Assistance Bed Mobility: Supine to Sit Supine to sit: Supervision Sit to supine: Min guard General  bed mobility comments: supervision for safety    Transfers  Overall transfer level: Needs assistance Equipment used: Rolling walker (2 wheeled) Transfers: Sit to/from Stand, Stand Pivot Transfers Sit to Stand: From elevated surface, Min guard Stand pivot transfers: Min guard General transfer comment: VC for hand placement and sequencing.    Ambulation / Gait / Stairs / Wheelchair Mobility  Ambulation/Gait Ambulation/Gait assistance: Hydrographic surveyor (Feet): 5 Feet Assistive device: Rolling walker (2 wheeled) Gait Pattern/deviations: (hop-to on R LE) General Gait Details: pt a bit anxious with ambulation, reported feeling "light-headed" and with "narrowed vision"; limited to 5' with RW Gait velocity: decreased Gait velocity interpretation: <1.8 ft/sec, indicate of risk for recurrent falls    Posture / Balance Balance Overall balance assessment: Needs assistance Sitting-balance support: No upper extremity supported Sitting balance-Leahy Scale: Good Standing balance support: Bilateral upper extremity supported Standing balance-Leahy Scale: Poor Standing balance comment: reliant on bilateral UEs on RW    Special needs/care consideration BiPAP/CPAP: No CPM: No Continuous Drip IV: No Dialysis: No        Days: NA Life Vest: No Oxygen: No Special Bed: No Trach Size: No Wound Vac (area): Yes      Location: LLE Skin: LLE surgical site                              Location: LLE Bowel mgmt: Continent per pt report; 11/21/17 Last BM Bladder mgmt:Continent; use of urinal  Diabetic mgmt: Yes     Previous Home Environment Living Arrangements: Spouse/significant other Available Help at Discharge: Family, Available 24 hours/day Type of Home: House Home Layout: Two level, 1/2 bath on main level Alternate Level Stairs-Number of Steps: flight, bedroom is upstairs but he can stay on the first floor  Home Access: Stairs to enter Entrance Stairs-Number of Steps: 1 Bathroom  Toilet: Handicapped height Home Care Services: No  Discharge Living Setting Plans for Discharge Living Setting: Patient's home Type of Home at Discharge: House Discharge Home Layout: Two level, 1/2 bath on main level(pt can live on 1st floor with pull out couch if need be) Alternate Level Stairs-Number of Steps: full flight of stairs for 2nd level Discharge Home Access: Stairs to enter Entrance Stairs-Number of Steps: 1 Discharge Bathroom Shower/Tub: Tub/shower unit Discharge Bathroom Toilet: Handicapped height(BSC frame over commode) Discharge Bathroom  Accessibility: Yes How Accessible: Accessible via walker Does the patient have any problems obtaining your medications?: No  Social/Family/Support Systems Patient Roles: Spouse, Parent Contact Information: wife Anticipated Caregiver: wife Randa Evens Anticipated Industrial/product designer Information: Randa Evens: [phone] Ability/Limitations of Caregiver: pt can assist as needed per her report Caregiver Availability: 24/7(as needed) Discharge Plan Discussed with Primary Caregiver: Yes Is Caregiver In Agreement with Plan?: Yes Does Caregiver/Family have Issues with Lodging/Transportation while Pt is in Rehab?: No   Goals/Additional Needs Patient/Family Goal for Rehab: Mod I OT and PT; no SLP Expected length of stay: 6-10 days Cultural Considerations: NA Dietary Needs: Carb Modified Diet; thin liquids; Medium calorie level 1600-2000 Equipment Needs: TBD at next level of care Special Service Needs: NA Additional Information: NA Pt/Family Agrees to Admission and willing to participate: Yes Program Orientation Provided & Reviewed with Pt/Caregiver Including Roles  & Responsibilities: Yes(wife and patient at the bedside)  Barriers to Discharge: Home environment access/layout  Barriers to Discharge Comments: Pt lives in 2 story home with full flight of stairs to get to bedroom and full bath; able to sleep on ground level with 1/2 bath if  needed   Decrease burden of Care through IP rehab admission: NA   Possible need for SNF placement upon discharge: Not anticipated   Patient Condition: This patient's condition remains as documented in the consult dated 11/23/17, in which the Rehabilitation Physician determined and documented that the patient's condition is appropriate for intensive rehabilitative care in an inpatient rehabilitation facility. Will admit to inpatient rehab today.  Preadmission Screen Completed By:  Nanine Means, 11/24/2017 1:07 PM ______________________________________________________________________   Discussed status with Dr. Riley Kill on 11/24/17 at 12:58PM and received telephone approval for admission today.  Admission Coordinator:  Nanine Means, time 12:58PM/Date 11/24/17

## 2017-11-25 ENCOUNTER — Inpatient Hospital Stay (HOSPITAL_COMMUNITY): Payer: Medicare Other | Admitting: Physical Therapy

## 2017-11-25 ENCOUNTER — Inpatient Hospital Stay (HOSPITAL_COMMUNITY): Payer: Medicare Other | Admitting: Occupational Therapy

## 2017-11-25 DIAGNOSIS — E119 Type 2 diabetes mellitus without complications: Secondary | ICD-10-CM

## 2017-11-25 DIAGNOSIS — S88112A Complete traumatic amputation at level between knee and ankle, left lower leg, initial encounter: Secondary | ICD-10-CM

## 2017-11-25 LAB — GLUCOSE, CAPILLARY
GLUCOSE-CAPILLARY: 98 mg/dL (ref 65–99)
Glucose-Capillary: 107 mg/dL — ABNORMAL HIGH (ref 65–99)
Glucose-Capillary: 115 mg/dL — ABNORMAL HIGH (ref 65–99)
Glucose-Capillary: 106 mg/dL — ABNORMAL HIGH (ref 65–99)

## 2017-11-25 NOTE — Evaluation (Addendum)
Physical Therapy Assessment and Plan  Patient Details  Name: Kevin Bradford MRN: [id number] Date of Birth: March 02, 1946  PT Diagnosis: Difficulty walking, Muscle weakness and Pain in LLE Rehab Potential: Excellent ELOS: 6-9 days      Today's Date: 11/25/2017 PT Individual Time: 1300-1400  AND 1500-1600  PT Individual Time Calculation (min): 60 min  AND 60 min  Problem List:  Patient Active Problem List   Diagnosis Date Noted  . Amputation of left lower extremity below knee upon examination ([place]) 11/24/2017  . Amputated below knee, left ([place]) 11/22/2017  . Osteomyelitis of left foot ([place])   . Dehiscence of amputation stump ([place]) 08/11/2017  . S/P transmetatarsal amputation of foot, left ([place]) 07/19/2017  . Subacute osteomyelitis, left ankle and foot ([place]) 07/17/2017  . Diabetic ulcer of left foot associated with type 2 diabetes mellitus ([place])   . Diabetes mellitus with renal complications ([place]) 76/16/0737  . Diabetes ([place]) 10/13/2014  . Anemia 10/13/2014  . CKD (chronic kidney disease) stage 3, GFR 30-59 ml/min (HCC) 10/13/2014  . Hyperkalemia 10/13/2014    Past Medical History:  Past Medical History:  Diagnosis Date  . Arthritis    "joint pain" (08/16/2017)  . Cellulitis and abscess of leg 10/18/2014  . CKD (chronic kidney disease) stage 3, GFR 30-59 ml/min (HCC)    creatinine increases with antibiotics per pt, no known kidney disease per patient  . Dehiscence of closure of skin, subsequent encounter   . Diabetic foot ulcer ([place])    left  . Diabetic peripheral neuropathy ([place])   . GERD (gastroesophageal reflux disease)   . Osteomyelitis ([place])    left transmetatarsal   . Type II diabetes mellitus ([place])    Past Surgical History:  Past Surgical History:  Procedure Laterality Date  . AMPUTATION Left 07/19/2017   Procedure: LEFT TRANSMETATARSAL AMPUTATION;  Surgeon: Newt Minion, MD;  Location: [facility];  Service: Orthopedics;  Laterality: Left;  . AMPUTATION Left 08/16/2017   REVISION LEFT TRANSMETATARSAL AMPUTATION  . AMPUTATION Left 11/22/2017   Procedure: LEFT BELOW KNEE AMPUTATION;  Surgeon: Newt Minion, MD;  Location: [facility];  Service: Orthopedics;  Laterality: Left;  . CATARACT EXTRACTION W/ INTRAOCULAR LENS  IMPLANT, BILATERAL  ~ 2016  . I&D EXTREMITY Left 10/17/2014   Procedure: IRRIGATION AND DEBRIDEMENT EXTREMITY LEFT FOOT;  Surgeon: Newt Minion, MD;  Location: [facility];  Service: Orthopedics;  Laterality: Left;  . STUMP REVISION Left 08/16/2017   Procedure: REVISION LEFT TRANSMETATARSAL AMPUTATION;  Surgeon: Newt Minion, MD;  Location: [facility];  Service: Orthopedics;  Laterality: Left;  . TOE AMPUTATION Right 2013   5th toe    Assessment & Plan Clinical Impression: Patient is a 72 year old right-handed male with history of CKD stage III, diabetes mellitus with peripheral neuropathy, peripheral vascular disease with left transmetatarsal amputation 07/19/2017 and revision 08/16/2017. Per report patient lives with his wife. 2 level home with bedroom on Main level and one-step entry. Wife assists as needed. Presented 11/22/2017 with progressive wound dehiscence as well as necrosis ulceration osteomyelitis lateral left foot. Underwent transmetatarsal 11/22/2017 per Dr. Sharol Given. Hospital course pain management. Application postoperatively of wound VAC.  Patient transferred to CIR on 11/24/2017 .   Patient currently requires min with mobility secondary to muscle weakness and muscle joint tightness, decreased cardiorespiratoy endurance and decreased standing balance, decreased balance strategies and difficulty maintaining precautions.  Prior to hospitalization, patient was modified independent  with mobility and lived with Spouse in a House home.  Home  access is 2Stairs to enter.  Patient will benefit from skilled PT intervention to maximize safe functional mobility, minimize fall risk and decrease caregiver burden for planned discharge home with intermittent assist.   Anticipate patient will benefit from follow up [facility] at discharge.  PT - End of Session Activity Tolerance: Tolerates 10 - 20 min activity with multiple rests Endurance Deficit: Yes PT Assessment Rehab Potential (ACUTE/IP ONLY): Excellent PT Barriers to Discharge: [place] home environment;Wound Care;Weight bearing restrictions PT Patient demonstrates impairments in the following area(s): Balance;Edema;Endurance;Pain;Safety;Skin Integrity PT Transfers Functional Problem(s): Bed Mobility;Bed to Chair;Car;Furniture;Floor PT Locomotion Functional Problem(s): Ambulation;Wheelchair Mobility;Stairs PT Plan PT Intensity: Minimum of 1-2 x/day ,45 to 90 minutes PT Frequency: 5 out of 7 days PT Duration Estimated Length of Stay: 6-9 days  PT Treatment/Interventions: Ambulation/gait training;Community reintegration;DME/adaptive equipment instruction;Neuromuscular re-education;Psychosocial support;UE/LE Strength taining/ROM;Stair training;Wheelchair propulsion/positioning;UE/LE Coordination activities;Therapeutic Activities;Skin care/wound management;Pain management;Discharge planning;Balance/vestibular training;Disease management/prevention;Functional mobility training;Patient/family education;Splinting/orthotics;Therapeutic Exercise;Visual/perceptual remediation/compensation PT Transfers Anticipated Outcome(s): Mod I with LRAD  PT Locomotion Anticipated Outcome(s): Mod I WC mobility supervision assist ambulation  PT Recommendation Recommendations for Other Services: Therapeutic Recreation consult Therapeutic Recreation Interventions: Outing/community reintergration;Stress management;Kitchen group Follow Up Recommendations: Home health PT Patient destination: Home Equipment Recommended: Wheelchair cushion (measurements);Wheelchair (measurements);Rolling walker with 5" wheels  Skilled Therapeutic Intervention  Session 1.  Pt received supine in bed and agreeable to PT. Supine>sit transfer with  supervision assist and min cues for safety. PT instructed patient in PT Evaluation and initiated treatment intervention; see below for results. PT educated patient in [place], rehab potential, rehab goals, and discharge recommendations. PT instructed pt in [facility] mobility with supervision assist, car and basic transfers with min assist, as well as ambulation with min assist and RW as listed below. Pt returned to room and performed stand pivot transfer to bed with supervision assist. Sit>supine completed with supervision assist and left supine in bed with call bell in reach and all needs met.   Session 2  Pt received supine in bed and agreeable to PT. Supine>sit transfer with supervision assist and min cues for use of bed features cues.  Sit<>stand from bed with min assist and min cues from PT.  WC mobility x 233f to day room, distant supervision assist from PT with   Stand pivot transfer to and from Nustep with supervisison assist. Nustep BUE and RLE strengthening x 8 min. Min cues for awareness of the LLE and proper speed.  Gait training over unlevel, surface x 170fwith min assist from PT for safety as well as min cues for AD management.   PT instructed pt in picking object up from floor with 1 UE support on RW and min assist from PT. Min cues for safety awareness and proper LE positioning to prevent back pain reduce risk of hitting on WC.   Pt returned to room and performed stand pivot transfer to bed with supervision assist from PT. Sit>supine completed with supervision assist, and pt left supine in bed with call bell in reach and all needs met.            PT Evaluation Precautions/Restrictions Precautions Precautions: Fall Restrictions Weight Bearing Restrictions: Yes LLE Weight Bearing: Non weight bearing General   Vital Signs Pain Pain Assessment Pain Scale: 0-10 Pain Score: 2  Pain Type: Acute pain Pain Location: Leg Pain Orientation: Left Pain Descriptors / Indicators:  Aching Home Living/Prior Functioning Home Living Available Help at Discharge: Family;Available 24 hours/day Type of Home: House Home Access: Stairs to enter [employer]  Steps: 2 Home Layout: Two level;1/2 bath on main level Alternate Level Stairs-Number of Steps: flight, bedroom and shower are upstairs and he would like to be able to go upstairs, however can stay on main level if needed Bathroom Shower/Tub: Tub/shower unit;Curtain Bathroom Toilet: Handicapped height  Lives With: Spouse Prior Function Level of Independence: Independent with basic ADLs;Independent with homemaking with ambulation;Independent with gait  Able to Take Stairs?: Yes Driving: Yes 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 Comments: plays pool, plays Teaching laboratory technician: Within Advertising copywriter Praxis Praxis: Intact  Cognition Overall Cognitive Status: Within Functional Limits for tasks assessed Arousal/Alertness: Awake/alert Attention: Alternating Memory: Appears intact Awareness: Appears intact Problem Solving: Appears intact Safety/Judgment: Appears intact Sensation Sensation Light Touch: Appears Intact Proprioception: Appears Intact Coordination Fine Motor Movements are Fluid and Coordinated: Yes Finger Nose Finger Test: WNL Motor  Motor Motor: Within Functional Limits Motor - Skilled Clinical Observations: mild weakness  Mobility Bed Mobility Bed Mobility: Rolling Right;Supine to Sit;Rolling Left;Sit to Supine Rolling Right: 5: Supervision Rolling Right Details: Verbal cues for technique;Verbal cues for precautions/safety Rolling Left: 5: Supervision Rolling Left Details: Verbal cues for technique;Verbal cues for precautions/safety Supine to Sit: 5: Supervision Supine to Sit Details: Verbal cues for technique;Verbal cues for precautions/safety Sit to Supine: 5: Supervision Sit to Supine - Details: Verbal  cues for precautions/safety;Verbal cues for gait pattern Transfers Transfers: Yes Sit to Stand: 4: Min guard;4: Min assist Sit to Stand Details: Verbal cues for technique;Verbal cues for precautions/safety Stand Pivot Transfers: 4: Min guard;4: Min Psychologist, occupational Details: Verbal cues for precautions/safety;Verbal cues for gait pattern Squat Pivot Transfers: 4: Min guard;4: Min Risk manager Details: Verbal cues for technique;Verbal cues for precautions/safety Locomotion  Ambulation Ambulation: Yes Ambulation/Gait Assistance: 4: Min assist Ambulation Distance (Feet): 50 Feet Assistive device: Rolling walker Ambulation/Gait Assistance Details: Verbal cues for precautions/safety;Verbal cues for gait pattern Gait Gait: Yes Gait Pattern: Step-to pattern(swing to ) Gait velocity: decreased Wheelchair Mobility Wheelchair Mobility: Yes Wheelchair Assistance: 5: Personnel officer Assistance Details: Verbal cues for Information systems manager: Both upper extremities Wheelchair Parts Management: Supervision/cueing  Trunk/Postural Assessment  Cervical Assessment Cervical Assessment: Within Functional Limits Thoracic Assessment Thoracic Assessment: Within Functional Limits Lumbar Assessment Lumbar Assessment: Within Functional Limits Postural Control Postural Control: Within Functional Limits  Balance Balance Balance Assessed: Yes Static Sitting Balance Static Sitting - Level of Assistance: 6: Modified independent (Device/Increase time) Dynamic Sitting Balance Dynamic Sitting - Level of Assistance: 5: Stand by assistance Static Standing Balance Static Standing - Level of Assistance: 5: Stand by assistance(BUE supported ) Dynamic Standing Balance Dynamic Standing - Level of Assistance: 4: Min assist(BUE supported ) Extremity Assessment  RUE Assessment RUE Assessment: Within Functional Limits LUE Assessment LUE Assessment: Within  Functional Limits RLE Assessment RLE Assessment: Within Functional Limits LLE Assessment LLE Assessment: Exceptions to WFL LLE AROM (degrees) LLE Overall AROM Comments: knee extension: -2 degrees full extension. knee extension ~90 degree LLE Strength LLE Overall Strength Comments: grossly 4-/5 proximal to distal with mild Distal LE pain    See Function Navigator for Current Functional Status.   Refer to Care Plan for Long Term Goals  Recommendations for other services: Therapeutic Recreation  Kitchen group, Stress management and Outing/community reintegration  Discharge Criteria: Patient will be discharged from PT if patient refuses treatment 3 consecutive times without medical reason, if treatment goals not met, if there is a change in medical status, if patient makes no progress  towards goals or if patient is discharged from hospital.  The above assessment, treatment plan, treatment alternatives and goals were discussed and mutually agreed upon: by patient  Lorie Phenix 11/25/2017, 1:15 PM

## 2017-11-25 NOTE — Progress Notes (Signed)
Kevin Bradford PHYSICAL MEDICINE & REHABILITATION     PROGRESS NOTE    Subjective/Complaints: Had a good night. Sitting up waiting to eat breakfast. Pain controlled  ROS: Patient denies fever, rash, sore throat, blurred vision, nausea, vomiting, diarrhea, cough, shortness of breath or chest pain, joint or back pain, headache, or mood change.    Objective: Vital Signs: Blood pressure 124/71, pulse (!) 59, temperature 98 F (36.7 C), temperature source Oral, resp. rate 18, SpO2 98 %. No results found. Recent Labs    11/22/17 1224  WBC 8.1  HGB 12.2*  HCT 37.4*  PLT 216   Recent Labs    11/22/17 1224  NA 141  K 4.2  CL 110  GLUCOSE 153*  BUN 20  CREATININE 1.28*  CALCIUM 8.8*   CBG (last 3)  Recent Labs    11/24/17 1658 11/24/17 2123 11/25/17 0641  GLUCAP 82 90 107*    Wt Readings from Last 3 Encounters:  11/22/17 100.7 kg (222 lb)  11/09/17 100.7 kg (222 lb)  10/26/17 100.7 kg (222 lb)    Physical Exam:  Constitutional: No distress . Vital signs reviewed. HEENT: EOMI, oral membranes moist Neck: supple Cardiovascular: RRR without murmur. No JVD    Respiratory: CTA Bilaterally without wheezes or rales. Normal effort    GI: BS +, non-tender, non-distended  Musculoskeletal: Left leg with edema distally. Tender to touch. Neurological: He isalert. Nocranial nerve deficit.Coordinationnormal.  UE 5/5 prox to distal. RLE 4-5/5 prox to distal. LLE: able to lift against gravity Skin: BKA site is dressed with wound VAC in place, limb guard Psychiatric: He has anormal mood and affect.    Assessment/Plan: 1. Functional deficits secondary to left BKA which require 3+ hours per day of interdisciplinary therapy in a comprehensive inpatient rehab setting. Physiatrist is providing close team supervision and 24 hour management of active medical problems listed below. Physiatrist and rehab team continue to assess barriers to discharge/monitor patient progress  toward functional and medical goals.  Function:  Bathing Bathing position      Bathing parts      Bathing assist        Upper Body Dressing/Undressing Upper body dressing                    Upper body assist        Lower Body Dressing/Undressing Lower body dressing                                  Lower body assist        Toileting Toileting          Toileting assist     Transfers Chair/bed transfer             Locomotion Ambulation           Wheelchair          Cognition Comprehension Comprehension assist level: Follows basic conversation/direction with no assist  Expression Expression assist level: Expresses basic needs/ideas: With no assist  Social Interaction    Problem Solving    Memory     Medical Problem List and Plan: 1.Decreased functional mobilitysecondary to left BKA 11/22/2017. Postoperative wound VAC -beginning therapies today   -expect short stay 2. DVT Prophylaxis/Anticoagulation: SCDs right lower extremity 3. Pain Management:Hydrocodone and Robaxin as needed 4. Mood:Provide emotional support 5. Neuropsych: This patientiscapable of making decisions on hisown behalf. 6. Skin/Wound Care:Routine skin checks -  vac for about 7 days from 5/8 -limb guard for protection/promote knee extension 7. Fluids/Electrolytes/Nutrition:Routine in and outs with follow-up chemistries 8.Diabetes mellitus with peripheral neuropathy. Hemoglobin A1c 6.7. NovoLog 4 units 3 times daily, Levemir 30 units nightly, Glucophage 500 mg twice daily.   -tight control at present  -no changes required 9.CKD stage III. Follow-up chemistries upon admit  -last cr 1.28 10.Constipation. Laxative assistance   LOS (Days) 1 A FACE TO FACE EVALUATION WAS PERFORMED  Ranelle Oyster, MD 11/25/2017 8:00 AM

## 2017-11-25 NOTE — Evaluation (Signed)
Occupational Therapy Assessment and Plan  Patient Details  Name: Kevin Bradford MRN: [id number] Date of Birth: 02-23-1946  OT Diagnosis: muscular wasting and disuse atrophy and muscle weakness (generalized) Rehab Potential: Rehab Potential (ACUTE ONLY): Good ELOS: 6-9 days   Today's Date: 11/25/2017 OT Individual Time: 1000-1130 OT Individual Time Calculation (min): 90 min     Problem List:  Patient Active Problem List   Diagnosis Date Noted  . Amputation of left lower extremity below knee upon examination ([place]) 11/24/2017  . Amputated below knee, left ([place]) 11/22/2017  . Osteomyelitis of left foot ([place])   . Dehiscence of amputation stump ([place]) 08/11/2017  . S/P transmetatarsal amputation of foot, left ([place]) 07/19/2017  . Subacute osteomyelitis, left ankle and foot ([place]) 07/17/2017  . Diabetic ulcer of left foot associated with type 2 diabetes mellitus ([place])   . Diabetes mellitus with renal complications ([place]) 45/36/4680  . Diabetes ([place]) 10/13/2014  . Anemia 10/13/2014  . CKD (chronic kidney disease) stage 3, GFR 30-59 ml/min (H[facility]) 10/13/2014  . Hyperkalemia 10/13/2014    Past Medical History:  Past Medical History:  Diagnosis Date  . Arthritis    "joint pain" (08/16/2017)  . Cellulitis and abscess of leg 10/18/2014  . CKD (chronic kidney disease) stage 3, GFR 30-59 ml/min ([facility])    creatinine increases with antibiotics per pt, no known kidney disease per patient  . Dehiscence of closure of skin, subsequent encounter   . Diabetic foot ulcer ([place])    left  . Diabetic peripheral neuropathy ([place])   . GERD (gastroesophageal reflux disease)   . Osteomyelitis ([place])    left transmetatarsal   . Type II diabetes mellitus ([place])    Past Surgical History:  Past Surgical History:  Procedure Laterality Date  . AMPUTATION Left 07/19/2017   Procedure: LEFT TRANSMETATARSAL AMPUTATION;  Surgeon: Newt Minion, MD;  Location: [facility];  Service: Orthopedics;  Laterality: Left;  . AMPUTATION  Left 08/16/2017   REVISION LEFT TRANSMETATARSAL AMPUTATION  . AMPUTATION Left 11/22/2017   Procedure: LEFT BELOW KNEE AMPUTATION;  Surgeon: Newt Minion, MD;  Location: [facility];  Service: Orthopedics;  Laterality: Left;  . CATARACT EXTRACTION W/ INTRAOCULAR LENS  IMPLANT, BILATERAL  ~ 2016  . I&D EXTREMITY Left 10/17/2014   Procedure: IRRIGATION AND DEBRIDEMENT EXTREMITY LEFT FOOT;  Surgeon: Newt Minion, MD;  Location: [facility];  Service: Orthopedics;  Laterality: Left;  . STUMP REVISION Left 08/16/2017   Procedure: REVISION LEFT TRANSMETATARSAL AMPUTATION;  Surgeon: Newt Minion, MD;  Location: [facility];  Service: Orthopedics;  Laterality: Left;  . TOE AMPUTATION Right 2013   5th toe    Assessment & Plan Clinical Impression: Patient is a 72 y.o. right-handed male with history of CKD stage III, diabetes mellitus with peripheral neuropathy, peripheral vascular disease with left transmetatarsal amputation 07/19/2017 and revision 08/16/2017. Per report patient lives with his wife. 2 level home with bedroom on Main level and one-step entry. Wife assists as needed. Presented 11/22/2017 with progressive wound dehiscence as well as necrosis ulceration osteomyelitis lateral left foot. Underwent Lt below knee amputation 11/22/2017 per Dr. Sharol Given. Hospital course pain management. Application postoperatively of wound VAC. Physical and occupational therapy evaluations completed 11/23/2017 with recommendations of physical medicine rehab consult. Patient was admitted for a comprehensive rehab program.    Patient transferred to CIR on 11/24/2017 .    Patient currently requires min with basic self-care skills secondary to muscle weakness and decreased standing balance, decreased postural control and decreased balance  strategies.  Prior to hospitalization, patient could complete ADLs with modified independent .  Patient will benefit from skilled intervention to increase independence with basic self-care skills prior to  discharge home with care partner.  Anticipate patient will require intermittent supervision and follow up home health vs None.  OT - End of Session Activity Tolerance: Endurance does not limit participation in activity OT Assessment Rehab Potential (ACUTE ONLY): Good OT Barriers to Discharge: Home environment access/layout OT Barriers to Discharge Comments: pt has 2 story home with 1/2 bath on mail floor and would like to go upstairs for shower OT Patient demonstrates impairments in the following area(s): Balance;Endurance;Motor;Pain;Safety;Skin Integrity OT Basic ADL's Functional Problem(s): Grooming;Bathing;Dressing;Toileting OT Advanced ADL's Functional Problem(s): Simple Meal Preparation OT Transfers Functional Problem(s): Toilet;Tub/Shower OT Additional Impairment(s): None OT Plan OT Intensity: Minimum of 1-2 x/day, 45 to 90 minutes OT Frequency: 5 out of 7 days OT Duration/Estimated Length of Stay: 6-9 days OT Treatment/Interventions: Balance/vestibular training;Community reintegration;Discharge planning;Disease mangement/prevention;DME/adaptive equipment instruction;Functional mobility training;Pain management;Patient/family education;Psychosocial support;Self Care/advanced ADL retraining;Skin care/wound managment;Therapeutic Activities;Therapeutic Exercise;UE/LE Strength taining/ROM;Wheelchair propulsion/positioning OT Basic Self-Care Anticipated Outcome(s): Mod I OT Toileting Anticipated Outcome(s): Mod I OT Bathroom Transfers Anticipated Outcome(s): Mod I - supervision OT Recommendation Patient destination: Home Follow Up Recommendations: (TBD) Equipment Recommended: 3 in 1 bedside comode;Tub/shower bench   Skilled Therapeutic Intervention OT eval completed with discussion of rehab process, OT purpose, POC, ELOS, and goals.  ADL assessment completed with bathing and dressing at sit > stand level from EOB.  Pt required min guard during dynamic sitting balance when washing lower  legs and donning Rt sock and shoe.  Educated on transfers with and without RW with pt requiring min assist.  Educated on tub/shower and toilet transfers in ADL apt bathroom with pt ambulating with RW min assist to each destination and completing transfers with min assist with use of tub transfer bench and 3 in 1 over toilet to provide UE support.  Discussed pt's wishes to get to where he can sleep upstairs in his bed and take showers.  Informed PT of pt goals to go upstairs.  OT Evaluation Precautions/Restrictions  Precautions Precautions: Fall Restrictions Weight Bearing Restrictions: Yes LLE Weight Bearing: Non weight bearing Pain Pain Assessment Pain Scale: 0-10 Pain Score: 2  Pain Type: Acute pain Pain Location: Leg Pain Orientation: Left Pain Descriptors / Indicators: Aching Pain Frequency: Intermittent Pain Onset: On-going Patients Stated Pain Goal: 2 Pain Intervention(s): Medication (See eMAR) Home Living/Prior [place] expects to be discharged to:: Private residence Living Arrangements: Spouse/significant other Available Help at Discharge: Family, Available 24 hours/day Type of Home: House Home Access: Stairs to enter Technical brewer of Steps: 2 Home Layout: Two level, 1/2 bath on main level Alternate Level Stairs-Number of Steps: flight, bedroom and shower are upstairs and he would like to be able to go upstairs, however can stay on main level if needed Bathroom Shower/Tub: Tub/shower unit, Architectural technologist: Handicapped height  Lives With: Spouse IADL History Homemaking Responsibilities: Yes Meal Prep Responsibility: Secondary Laundry Responsibility: Secondary Bill Paying/Finance Responsibility: Primary Prior Function Level of Independence: Independent with basic ADLs, Independent with homemaking with ambulation, Independent with gait  Able to Take Stairs?: Yes Driving: Yes 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 Comments: plays pool, plays [employer] ADL  See Function Navigator Vision Baseline Vision/History: Wears glasses Wears Glasses: Reading only Patient Visual Report: No change from baseline Vision Assessment?: No apparent visual deficits Perception  Perception: Within  Functional Limits Praxis Praxis: Intact Cognition Overall Cognitive Status: Within Functional Limits for tasks assessed Arousal/Alertness: Awake/alert Orientation Level: Person;Place;Situation Person: Oriented Place: Oriented Situation: Oriented Year: 2019 Month: May Day of Week: Correct Memory: Appears intact Immediate Memory Recall: Sock;Blue;Bed Memory Recall: Sock;Blue;Bed Memory Recall Sock: Without Cue Memory Recall Blue: Without Cue Memory Recall Bed: Without Cue Attention: Alternating Awareness: Appears intact Problem Solving: Appears intact Safety/Judgment: Appears intact Sensation Sensation Light Touch: Appears Intact Proprioception: Appears Intact Coordination Fine Motor Movements are Fluid and Coordinated: Yes Finger Nose Finger Test: WNL Extremity/Trunk Assessment RUE Assessment RUE Assessment: Within Functional Limits LUE Assessment LUE Assessment: Within Functional Limits   See Function Navigator for Current Functional Status.   Refer to Care Plan for Long Term Goals  Recommendations for other services: None    Discharge Criteria: Patient will be discharged from OT if patient refuses treatment 3 consecutive times without medical reason, if treatment goals not met, if there is a change in medical status, if patient makes no progress towards goals or if patient is discharged from hospital.  The above assessment, treatment plan, treatment alternatives and goals were discussed and mutually agreed upon: by patient and by family  Ellwood [facility] 11/25/2017, 11:49 AM

## 2017-11-26 ENCOUNTER — Inpatient Hospital Stay (HOSPITAL_COMMUNITY): Payer: Medicare Other | Admitting: Physical Therapy

## 2017-11-26 ENCOUNTER — Inpatient Hospital Stay (HOSPITAL_COMMUNITY): Payer: Medicare Other | Admitting: Occupational Therapy

## 2017-11-26 LAB — GLUCOSE, CAPILLARY
GLUCOSE-CAPILLARY: 92 mg/dL (ref 65–99)
GLUCOSE-CAPILLARY: 100 mg/dL — AB (ref 65–99)
GLUCOSE-CAPILLARY: 125 mg/dL — AB (ref 65–99)
Glucose-Capillary: 84 mg/dL (ref 65–99)

## 2017-11-26 NOTE — Progress Notes (Signed)
Kevin Bradford PHYSICAL MEDICINE & REHABILITATION     PROGRESS NOTE    Subjective/Complaints: Things went well with therapy yesterday.  Pain was tolerable.  Slept well overnight.  ROS: Patient denies fever, rash, sore throat, blurred vision, nausea, vomiting, diarrhea, cough, shortness of breath or chest pain, joint or back pain, headache, or mood change.    Objective: Vital Signs: Blood pressure (!) 160/91, pulse 64, temperature (!) 97.5 F (36.4 C), temperature source Oral, resp. rate 19, height  (1.88 m), weight 98 kg (216 lb 0.8 oz), SpO2 100 %. No results found. No results for input(s): WBC, HGB, HCT, PLT in the last 72 hours. No results for input(s): NA, K, CL, GLUCOSE, BUN, CREATININE, CALCIUM in the last 72 hours.  Invalid input(s): CO CBG (last 3)  Recent Labs    11/25/17 1631 11/25/17 2044 11/26/17 0644  GLUCAP 115* 106* 92    Wt Readings from Last 3 Encounters:  11/26/17 98 kg (216 lb 0.8 oz)  11/22/17 100.7 kg (222 lb)  11/09/17 100.7 kg (222 lb)    Physical Exam:  Constitutional: No distress . Vital signs reviewed. HEENT: EOMI, oral membranes moist Neck: supple Cardiovascular: RRR without murmur. No JVD    Respiratory: CTA Bilaterally without wheezes or rales. Normal effort    GI: BS +, non-tender, non-distended  Musculoskeletal: Left leg with edema distally. Tender to touch. Neurological: He isalert. Nocranial nerve deficit.Coordinationnormal.  UE 5/5 prox to distal. RLE 4-5/5 prox to distal. LLE: able to lift against gravity Skin: BKA site is dressed with wound VAC in place, limb guard in place Psychiatric: He has anormal mood and affect.    Assessment/Plan: 1. Functional deficits secondary to left BKA which require 3+ hours per day of interdisciplinary therapy in a comprehensive inpatient rehab setting. Physiatrist is providing close team supervision and 24 hour management of active medical problems listed below. Physiatrist and rehab  team continue to assess barriers to discharge/monitor patient progress toward functional and medical goals.  Function:  Bathing Bathing position   Position: Sitting EOB  Bathing parts Body parts bathed by patient: Right arm, Left arm, Chest, Abdomen, Right upper leg, Left upper leg Body parts bathed by helper: Back  Bathing assist Assist Level: Touching or steadying assistance(Pt > 75%)(Mod assist FIM)      Upper Body Dressing/Undressing Upper body dressing   What is the patient wearing?: Pull over shirt/dress     Pull over shirt/dress - Perfomed by patient: Thread/unthread right sleeve, Thread/unthread left sleeve, Put head through opening, Pull shirt over trunk          Upper body assist Assist Level: Set up   Set up : To obtain clothing/put away  Lower Body Dressing/Undressing Lower body dressing   What is the patient wearing?: Pants, Socks, Shoes     Pants- Performed by patient: Thread/unthread right pants leg, Thread/unthread left pants leg, Pull pants up/down       Socks - Performed by patient: Don/doff right sock(Lt BKA)   Shoes - Performed by patient: Don/doff right shoe, Fasten right            Lower body assist Assist for lower body dressing: Touching or steadying assistance (Pt > 75%)      Toileting Toileting          Toileting assist     Transfers Chair/bed transfer   Chair/bed transfer method: Stand pivot, Squat pivot Chair/bed transfer assist level: Touching or steadying assistance (Pt > 75%) Chair/bed transfer assistive  device: Armrests, Patent attorney     Max distance: 25ft  Assist level: Touching or steadying assistance (Pt > 75%)   Wheelchair   Type: Manual Max wheelchair distance: 169ft  Assist Level: Supervision or verbal cues  Cognition Comprehension Comprehension assist level: Follows complex conversation/direction with no assist  Expression Expression assist level: Expresses complex ideas: With no assist   Social Interaction Social Interaction assist level: Interacts appropriately with others - No medications needed.  Problem Solving Problem solving assist level: Solves basic problems with no assist  Memory Memory assist level: Complete Independence: No helper   Medical Problem List and Plan: 1.Decreased functional mobilitysecondary to left BKA 11/22/2017. Postoperative wound VAC -Continue PT and OT  -expect short stay 2. DVT Prophylaxis/Anticoagulation: SCDs right lower extremity 3. Pain Management:Hydrocodone and Robaxin as needed 4. Mood:Provide emotional support 5. Neuropsych: This patientiscapable of making decisions on hisown behalf. 6. Skin/Wound Care:Routine skin checks -vac for about 7 days from 5/8 -limb guard for protection/promote knee extension 7. Fluids/Electrolytes/Nutrition:Routine in and outs with follow-up chemistries 8.Diabetes mellitus with peripheral neuropathy. Hemoglobin A1c 6.7. NovoLog 4 units 3 times daily, Levemir 30 units nightly, Glucophage 500 mg twice daily.   -tight control 5/12  -no changes required 9.CKD stage III. Follow-up chemistries tomorrow  -last cr 1.28 10.Constipation. Laxative assistance   LOS (Days) 2 A FACE TO FACE EVALUATION WAS PERFORMED  Ranelle Oyster, MD 11/26/2017 7:31 AM

## 2017-11-26 NOTE — Progress Notes (Signed)
Occupational Therapy Session Note  Patient Details  Name: Kevin Bradford MRN: [id number] Date of Birth: 25-Jan-1946  Today's Date: 11/26/2017 OT Individual Time: 0700-0800 OT Individual Time Calculation (min): 60 min    Short Term Goals: Week 1:  OT Short Term Goal 1 (Week 1): STG = LTGs due to ELOS  Skilled Therapeutic Interventions/Progress Updates:    Pt seen for OT session focusing on functional functional transffers and mobility with IADLs. Pt in supine upon arrival with MD present completing AM Kala Ambriz. Pt agreeable to tx session and denying pain.  Throughout session he completed stand pivot transfers with RW and close supervision- steadying of RW. During session completed transfers in ADL aparmaent, ambulating to surfaces and completing sit <> stand with RW to standard bed, low soft surface couch and standard toilet. All completed with supervision. He ambulated within kitchen and completed kitchen mobility activities at both ambulation and then at w/c level with education provided for RW management and w/c mobility techniques in functional setting. Pt returned to room at end of session, requested return to supine. Left sitting up in bed, set-up with breakfast tray and all needs in reach. Throughout session, education provided regarding energy conservation, continuum of care, home set-up, and d/c planning.   Therapy Documentation Precautions:  Precautions Precautions: Fall Restrictions Weight Bearing Restrictions: Yes LLE Weight Bearing: Non weight bearing  See Function Navigator for Current Functional Status.   Therapy/Group: Individual Therapy  Brookelle Pellicane L 11/26/2017, 6:42 AM

## 2017-11-26 NOTE — Progress Notes (Signed)
Physical Therapy Note  Patient Details  Name: Kevin Bradford MRN: [id number] Date of Birth: 12-28-1945 Today's Date: 11/26/2017    Time: 1015-1045 30 minutes  1:1 No c/o pain, pt states he rec'd pain meds prior to session. Bed mobility and sit to stand with supervision with RW.  Squat pivot transfers bed <> w/c <> mat with supervision, cues for set up, w/c parts management and safety.  Gait with RW 25' x 2 with close supervision.  Press ups in seated to 4'' step to simulate going upstairs on his bottom at home.  Pt able to perform 4x mat to 4'' step with supervision.  tricep press ups for continued UE strengthening 2 x 15.  PT attempted to encourage pt to perform steps only every other day for showering, pt states "I plan to continue my normal routine, going upstairs won't be a problem".  Pt left in bed with alarm set, needs at hand.   Brendan Gadson 11/26/2017, 10:48 AM

## 2017-11-27 ENCOUNTER — Inpatient Hospital Stay (HOSPITAL_COMMUNITY): Payer: Medicare Other

## 2017-11-27 ENCOUNTER — Inpatient Hospital Stay (HOSPITAL_COMMUNITY): Payer: Medicare Other | Admitting: Physical Therapy

## 2017-11-27 DIAGNOSIS — E1142 Type 2 diabetes mellitus with diabetic polyneuropathy: Secondary | ICD-10-CM

## 2017-11-27 DIAGNOSIS — G8918 Other acute postprocedural pain: Secondary | ICD-10-CM

## 2017-11-27 DIAGNOSIS — N182 Chronic kidney disease, stage 2 (mild): Secondary | ICD-10-CM

## 2017-11-27 DIAGNOSIS — D62 Acute posthemorrhagic anemia: Secondary | ICD-10-CM

## 2017-11-27 DIAGNOSIS — N183 Chronic kidney disease, stage 3 unspecified: Secondary | ICD-10-CM

## 2017-11-27 DIAGNOSIS — R0989 Other specified symptoms and signs involving the circulatory and respiratory systems: Secondary | ICD-10-CM

## 2017-11-27 LAB — CBC WITH DIFFERENTIAL/PLATELET
BASOS ABS: 0 10*3/uL (ref 0.0–0.1)
BASOS PCT: 1 %
EOS ABS: 0.2 10*3/uL (ref 0.0–0.7)
EOS PCT: 3 %
HCT: 37.1 % — ABNORMAL LOW (ref 39.0–52.0)
Hemoglobin: 12.2 g/dL — ABNORMAL LOW (ref 13.0–17.0)
LYMPHS ABS: 1.8 10*3/uL (ref 0.7–4.0)
Lymphocytes Relative: 34 %
MCH: 29.8 pg (ref 26.0–34.0)
MCHC: 32.9 g/dL (ref 30.0–36.0)
MCV: 90.5 fL (ref 78.0–100.0)
Monocytes Absolute: 0.3 10*3/uL (ref 0.1–1.0)
Monocytes Relative: 5 %
Neutro Abs: 3.1 10*3/uL (ref 1.7–7.7)
Neutrophils Relative %: 57 %
PLATELETS: 236 10*3/uL (ref 150–400)
RBC: 4.1 MIL/uL — AB (ref 4.22–5.81)
RDW: 12.6 % (ref 11.5–15.5)
WBC: 5.4 10*3/uL (ref 4.0–10.5)

## 2017-11-27 LAB — COMPREHENSIVE METABOLIC PANEL
ALT: 11 U/L — AB (ref 17–63)
AST: 20 U/L (ref 15–41)
Albumin: 3.5 g/dL (ref 3.5–5.0)
Alkaline Phosphatase: 70 U/L (ref 38–126)
Anion gap: 7 (ref 5–15)
BILIRUBIN TOTAL: 0.7 mg/dL (ref 0.3–1.2)
BUN: 18 mg/dL (ref 6–20)
CHLORIDE: 106 mmol/L (ref 101–111)
CO2: 27 mmol/L (ref 22–32)
Calcium: 9.3 mg/dL (ref 8.9–10.3)
Creatinine, Ser: 1.3 mg/dL — ABNORMAL HIGH (ref 0.61–1.24)
GFR calc non Af Amer: 53 mL/min — ABNORMAL LOW (ref >60)
Glucose, Bld: 150 mg/dL — ABNORMAL HIGH (ref 65–99)
Potassium: 4.1 mmol/L (ref 3.5–5.1)
Sodium: 140 mmol/L (ref 135–145)
Total Protein: 6.5 g/dL (ref 6.5–8.1)

## 2017-11-27 LAB — GLUCOSE, CAPILLARY
GLUCOSE-CAPILLARY: 111 mg/dL — AB (ref 65–99)
GLUCOSE-CAPILLARY: 105 mg/dL — AB (ref 65–99)
GLUCOSE-CAPILLARY: 64 mg/dL — AB (ref 65–99)
GLUCOSE-CAPILLARY: 90 mg/dL (ref 65–99)
Glucose-Capillary: 125 mg/dL — ABNORMAL HIGH (ref 65–99)

## 2017-11-27 MED ORDER — ENOXAPARIN SODIUM 40 MG/0.4ML ~~LOC~~ SOLN
40.0000 mg | SUBCUTANEOUS | Status: DC
  Administered 2017-11-27 – 2017-11-30 (×4): 40 mg via SUBCUTANEOUS
  Filled 2017-11-27 (×4): qty 0.4

## 2017-11-27 MED ORDER — INSULIN DETEMIR 100 UNIT/ML ~~LOC~~ SOLN
25.0000 [IU] | Freq: Every day | SUBCUTANEOUS | Status: DC
  Administered 2017-11-28 – 2017-11-29 (×2): 25 [IU] via SUBCUTANEOUS
  Filled 2017-11-27 (×3): qty 0.25

## 2017-11-27 NOTE — Progress Notes (Signed)
[facility] PHYSICAL MEDICINE & REHABILITATION     PROGRESS NOTE    Subjective/Complaints: Pt seen sitting up in bed this AM.  He slept well overnight.  He states he had a good weekend.   ROS: Denies CP, SOB, N/V/D  Objective: Vital Signs: Blood pressure (!) 144/81, pulse (!) 59, temperature 97.8 F (36.6 C), temperature source Oral, resp. rate 16, height  (1.88 m), weight 98 kg (216 lb 0.8 oz), SpO2 99 %. No results found. No results for input(s): WBC, HGB, HCT, PLT in the last 72 hours. No results for input(s): NA, K, CL, GLUCOSE, BUN, CREATININE, CALCIUM in the last 72 hours.  Invalid input(s): CO CBG (last 3)  Recent Labs    11/26/17 1644 11/26/17 2141 11/27/17 0618  GLUCAP 125* 84 111*    Wt Readings from Last 3 Encounters:  11/26/17 98 kg (216 lb 0.8 oz)  11/22/17 100.7 kg (222 lb)  11/09/17 100.7 kg (222 lb)    Physical Exam:  Constitutional: No distress . Vital signs reviewed. HENT: Normocephalic.  Atraumatic. Eyes: EOMI. No discharge. Cardiovascular: RRR. No JVD. Respiratory: CTA Bilaterally. Normal effort. GI: BS +. Non-distended. Musc: No edema or tenderness in extremities. Musculoskeletal:LLE tenderness Neurological: He isalert.  Motor: B/l UE 5/5 prox to distal.  RLE 4+/5 prox to distal.  LLE: HF 4+/5 Skin:BKA site is dressed with wound VAC in place Psychiatric: He has anormal mood and affect.  Assessment/Plan: 1. Functional deficits secondary to left BKA which require 3+ hours per day of interdisciplinary therapy in a comprehensive inpatient rehab setting. Physiatrist is providing close team supervision and 24 hour management of active medical problems listed below. Physiatrist and rehab team continue to assess barriers to discharge/monitor patient progress toward functional and medical goals.  Function:  Bathing Bathing position   Position: Sitting EOB  Bathing parts Body parts bathed by patient: Right arm, Left arm, Chest, Abdomen,  Right upper leg, Left upper leg Body parts bathed by helper: Back  Bathing assist Assist Level: Touching or steadying assistance(Pt > 75%)(Mod assist FIM)      Upper Body Dressing/Undressing Upper body dressing   What is the patient wearing?: Pull over shirt/dress     Pull over shirt/dress - Perfomed by patient: Thread/unthread right sleeve, Thread/unthread left sleeve, Put head through opening, Pull shirt over trunk          Upper body assist Assist Level: Set up   Set up : To obtain clothing/put away  Lower Body Dressing/Undressing Lower body dressing   What is the patient wearing?: Pants, Socks, Shoes     Pants- Performed by patient: Thread/unthread right pants leg, Thread/unthread left pants leg, Pull pants up/down       Socks - Performed by patient: Don/doff right sock(Lt BKA)   Shoes - Performed by patient: Don/doff right shoe, Fasten right            Lower body assist Assist for lower body dressing: Touching or steadying assistance (Pt > 75%)      Toileting Toileting          Toileting assist     Transfers Chair/bed transfer   Chair/bed transfer method: Stand pivot, Squat pivot Chair/bed transfer assist level: Touching or steadying assistance (Pt > 75%) Chair/bed transfer assistive device: Armrests, Patent attorney     Max distance: 42ft  Assist level: Touching or steadying assistance (Pt > 75%)   Wheelchair   Type: Manual Max wheelchair distance: 113ft  Assist Level: Supervision or verbal cues  Cognition Comprehension Comprehension assist level: Follows complex conversation/direction with no assist  Expression Expression assist level: Expresses complex ideas: With no assist  Social Interaction Social Interaction assist level: Interacts appropriately with others - No medications needed.  Problem Solving Problem solving assist level: Solves basic problems with no assist  Memory Memory assist level: Complete Independence: No  helper   Medical Problem List and Plan: 1.Decreased functional mobilitysecondary to left BKA 11/22/2017. Postoperative wound VAC  Continue CIR  Notes reviewed, labs reviewed 2. DVT Prophylaxis/Anticoagulation: Lovenox started on 5/13 3. Pain Management:Hydrocodone and Robaxin as needed 4. Mood:Provide emotional support 5. Neuropsych: This patientiscapable of making decisions on hisown behalf. 6. Skin/Wound Care:Routine skin checks Plan to d/c VAC ~ 5/15 Limb guard for protection/promote knee extension 7. Fluids/Electrolytes/Nutrition:Routine in and outs  8.Diabetes mellitus with peripheral neuropathy. Hemoglobin A1c 6.7. NovoLog 4 units 3 times daily, Levemir 30 units nightly, Glucophage 500 mg twice daily.   Controlled on 5/13 9.CKD stage III. Follow-up chemistries tomorrow  Cr 1.28 on 5/8  Labs pending 10.Constipation. Laxative assistance 11. Labile blood pressure  Cont to monitor for trend 12. Acute blood loss anemia  Hb 12.2 on 5/8  Cont to monitor  LOS (Days) 3 A FACE TO FACE EVALUATION WAS PERFORMED  Brnadon Eoff Karis Juba, MD 11/27/2017 8:18 AM

## 2017-11-27 NOTE — IPOC Note (Addendum)
Overall Plan of Care [facility]) Patient Details Name: Kevin Bradford MRN: [id number] DOB: 09-Apr-1946  Admitting Diagnosis: Left BKA  Hospital Problems: Active Problems:   Amputation of left lower extremity below knee upon examination (HCC)   Acute blood loss anemia   Labile blood pressure   Stage 3 chronic kidney disease (HCC)   Type 2 diabetes mellitus with peripheral neuropathy (HCC)   Post-operative pain     Functional Problem List: Nursing Endurance, Motor, Pain, Safety, Skin Integrity  PT Balance, Edema, Endurance, Pain, Safety, Skin Integrity  OT Balance, Endurance, Motor, Pain, Safety, Skin Integrity  SLP    TR         Basic ADL's: OT Grooming, Bathing, Dressing, Toileting     Advanced  ADL's: OT Simple Meal Preparation     Transfers: PT Bed Mobility, Bed to Chair, Car, Furniture, Floor  OT Toilet, Research scientist (life sciences): PT Ambulation, Psychologist, prison and probation services, Stairs     Additional Impairments: OT None  SLP        TR      Anticipated Outcomes Item Anticipated Outcome  Self Feeding    Swallowing      Basic self-care  Mod I  Toileting  Mod I   Bathroom Transfers Mod I - supervision  Bowel/Bladder  Continent to bowel and bladder with min. assist.  Transfers  Mod I with LRAD   Locomotion  Mod I WC mobility supervision assist ambulation   Communication     Cognition     Pain  Less than 3,on 1 to 10 scale.  Safety/Judgment  Free from falls during his stay in rehab.   Therapy Plan: PT Intensity: Minimum of 1-2 x/day ,45 to 90 minutes PT Frequency: 5 out of 7 days PT Duration Estimated Length of Stay: 6-9 days  OT Intensity: Minimum of 1-2 x/day, 45 to 90 minutes OT Frequency: 5 out of 7 days OT Duration/Estimated Length of Stay: 6-9 days      Team Interventions: Nursing Interventions Patient/Family Education, Pain Management, Skin Care/Wound Management  PT interventions Ambulation/gait training, Community reintegration, DME/adaptive  equipment instruction, Neuromuscular re-education, Psychosocial support, UE/LE Strength taining/ROM, Stair training, Wheelchair propulsion/positioning, UE/LE Coordination activities, Therapeutic Activities, Skin care/wound management, Pain management, Discharge planning, Warden/ranger, Disease management/prevention, Functional mobility training, Patient/family education, Splinting/orthotics, Therapeutic Exercise, Visual/perceptual remediation/compensation  OT Interventions Warden/ranger, Community reintegration, Discharge planning, Disease mangement/prevention, DME/adaptive equipment instruction, Functional mobility training, Pain management, Patient/family education, Psychosocial support, Self Care/advanced ADL retraining, Skin care/wound managment, Therapeutic Activities, Therapeutic Exercise, UE/LE Strength taining/ROM, Wheelchair propulsion/positioning  SLP Interventions    TR Interventions    SW/CM Interventions Discharge Planning, Psychosocial Support, Patient/Family Education   Barriers to Discharge MD  Medical stability, Wound care and Weight bearing restrictions  Nursing      PT Inaccessible home environment, Wound Care, Weight bearing restrictions    OT Home environment access/layout pt has 2 story home with 1/2 bath on mail floor and would like to go upstairs for shower  SLP      SW       Team Discharge Planning: Destination: PT-Home ,OT- Home , SLP-  Projected Follow-up: PT-Home health PT, OT-  (TBD), SLP-  Projected Equipment Needs: PT-Wheelchair cushion (measurements), Wheelchair (measurements), Rolling walker with 5" wheels, OT- 3 in 1 bedside comode, Tub/shower bench, SLP-  Equipment Details: PT- , OT-  Patient/family involved in discharge planning: PT- Patient, Family member/caregiver,  OT-Patient, Family member/caregiver, SLP-   MD ELOS: 4-7 days. Medical Rehab Prognosis:  Good  Assessment: 72 year old right-handed male with history of CKD  stage III, diabetes mellitus with peripheral neuropathy, peripheral vascular disease with left transmetatarsal amputation 07/19/2017 and revision 08/16/2017. Presented 11/22/2017 with progressive wound dehiscence as well as necrosis ulceration osteomyelitis lateral left foot. Underwent transmetatarsal 11/22/2017 per Dr. Lajoyce Corners. Hospital course pain management. Application postoperatively of wound VAC. Patient with resulting functional deficits with mobility and transfers.  Will set goals for Mod I with PT/OT.  See Team Conference Notes for weekly updates to the plan of care

## 2017-11-27 NOTE — Progress Notes (Signed)
Patient information reviewed and entered into eRehab system by Fowler Antos, RN, [name], PPS Coordinator.  Information including medical coding and functional independence measure will be reviewed and updated through discharge.     Per nursing patient was given "Data Collection Information Summary for Patients in Inpatient Rehabilitation Facilities with attached "Privacy Act Statement-Health Care Records" upon admission.  

## 2017-11-27 NOTE — Progress Notes (Signed)
Occupational Therapy Session Note  Patient Details  Name: Kevin Bradford MRN: [id number] Date of Birth: 12-Jul-1946  Today's Date: 11/27/2017 OT Individual Time: 0148-4039 Session 2: [id number]  OT Individual Time Calculation (min): 56 min Session 2: 72 min   Short Term Goals: Week 1:  OT Short Term Goal 1 (Week 1): STG = LTGs due to ELOS  Skilled Therapeutic Interventions/Progress Updates:    Session 1: Pt supine in bed agreeable to therapy. Session focused on dynamic sitting and standing balance, B UE strength, and ADL transfers. Pt completed stand pivot transfer from EOB with RW to w/c with CGA. Pt propelled to therapy gym with extra time and no cueing. Pt transferred to edge of mat and sat unsupported for duration of session with no LOB. Pt completed functional reaching task with trunk rotation and forward trunk flexion, with vc for technique/activation of core. Pt then transferred to standing with (S) and stood with RW, removing 1 UE from RW at a time to reach laterally. Tactile cues provided to ext L hip and knee, d/t pt favoring both in flex. Pt resistant to education throughout session. Pt then transferred to prone in order to increase strength through B UE via weight bearing on elbows, as well as to promote hip extension. Pt completed 4x sit to stand transfers from mat with different heights, all completed with SBA. Pt propelled back to room and left in supine with bed alarm set and all needs met.   Session 2: Pt supine in bed agreeable to therapy. Pt provided with handouts and extensive education re amputation including: contracture management, positioning, pain management techniques, leisure task engagement, prosthetic preparation/expectations, and de-sensitization techniques. Pt had various questions and engaged fully in conversation. Pt then propelled to therapy gym and completed 6 min on B UE ergometer in order to increase B UE strength/endurance. Pt then completed game from standing level  with 1 UE released from RW. Vc and visual cues provided to self-correct position of L LE, d/t pt favoring limb in hip flex. Reinforced therapy concepts discussed in previous session, including benefits of prone, which pt was excited to carry over to home. Pt returned to his room and was left in supine with bed alarm set and all needs met.   Therapy Documentation Precautions:  Precautions Precautions: Fall Restrictions Weight Bearing Restrictions: Yes LLE Weight Bearing: Non weight bearing  Pain: Pain Assessment Pain Scale: 0-10 Pain Score: 0-No pain Pain Type: Surgical pain Pain Location: Leg Pain Orientation: Left Pain Descriptors / Indicators: Aching Pain Frequency: Intermittent Pain Onset: Gradual Patients Stated Pain Goal: 2 Pain Intervention(s): Medication (See eMAR);Repositioned Multiple Pain Sites: No  See Function Navigator for Current Functional Status.   Therapy/Group: Individual Therapy  Curtis Sites 11/27/2017, 9:59 AM

## 2017-11-27 NOTE — Progress Notes (Signed)
Physical Therapy Session Note  Patient Details  Name: Kevin Bradford MRN: [id number] Date of Birth: 1945-11-06  Today's Date: 11/27/2017 PT Individual Time: 1000-1100 PT Individual Time Calculation (min): 60 min   Short Term Goals: Week 1:  PT Short Term Goal 1 (Week 1): STG =LTG due to ELOS   Skilled Therapeutic Interventions/Progress Updates: Pt received supine in bed, denies pain currently after recently taking medication and agreeable to treatment. Supine>sit with S and increased time; pt opts to use HOB elevated. Squat pivot throughout session with S. Performed supine bridging with bolster, sidelying straight leg raise 2x15 reps each. Prone lying x2 min however pt poorly tolerates position; transitioned to supine L hip flexor stretch off side of mat with improved tolerance and report of stretching in correct area. Gait x50' with RW and close S; slow speed. Reviewed recommendations for minimizing impact on R foot, importance of daily skin checks to residual limb as well as intact foot to monitor for changes, reducing the amount of ambulation done in the home for safety, energy conservation, and skin protection. Pt reports he plans to walk "occasionally" at home as needed, but plans to primarily use the w/c as the RW is "my greatest enemy". Performed nustep x8 min on level 8 with instruction regarding RPE scale with pt reporting RPE of 14 during activity. Discussed follow up and equipment recommendations; plan for [facility] PT and needs w/c, therapist will communicate with CSW. Returned to bed with S squat pivot at end of session; remained supine in bed, alarm intact and all needs in reach.      Therapy Documentation Precautions:  Precautions Precautions: Fall Restrictions Weight Bearing Restrictions: Yes LLE Weight Bearing: Non weight bearing   See Function Navigator for Current Functional Status.   Therapy/Group: Individual Therapy  Vista Lawman 11/27/2017, 10:59 AM

## 2017-11-27 NOTE — Progress Notes (Signed)
Inpatient Rehabilitation Center Individual Statement of Services  Patient Name:  Kevin Bradford  Date:  11/27/2017  Welcome to the Inpatient Rehabilitation Center.  Our goal is to provide you with an individualized program based on your diagnosis and situation, designed to meet your specific needs.  With this comprehensive rehabilitation program, you will be expected to participate in at least 3 hours of rehabilitation therapies Monday-Friday, with modified therapy programming on the weekends.  Your rehabilitation program will include the following services:  Physical Therapy (PT), Occupational Therapy (OT), 24 hour per day rehabilitation nursing, Case Management (Social Worker), Rehabilitation Medicine, Nutrition Services and Pharmacy Services  Weekly team conferences will be held on Wednesdays to discuss your progress.  Your Social Worker will talk with you frequently to get your input and to update you on team discussions.  Team conferences with you and your family in attendance may also be held.  Expected length of stay:  6 to 9 days  Overall anticipated outcome:  Modified independent with supervision for showering, meal preparation, ambulation, and stairs  Depending on your progress and recovery, your program may change. Your Social Worker will coordinate services and will keep you informed of any changes. Your Social Worker's name and contact numbers are listed  below.  The following services may also be recommended but are not provided by the Inpatient Rehabilitation Center:   Driving Evaluations  Home Health Rehabiltiation Services  Outpatient Rehabilitation Services   Arrangements will be made to provide these services after discharge if needed.  Arrangements include referral to agencies that provide these services.  Your insurance has been verified to be:  Medicare and [employer] Your primary doctor is:  Dr. Georgann Housekeeper  Pertinent information will be shared with  your doctor and your insurance company.  Social Worker:  [name], LCSW  [phone] or ([phone]  Information discussed with and copy given to patient by: Elvera Lennox, 11/27/2017, 1:58 PM

## 2017-11-28 ENCOUNTER — Inpatient Hospital Stay (HOSPITAL_COMMUNITY): Payer: Medicare Other | Admitting: Physical Therapy

## 2017-11-28 ENCOUNTER — Inpatient Hospital Stay (HOSPITAL_COMMUNITY): Payer: Medicare Other | Admitting: Occupational Therapy

## 2017-11-28 LAB — GLUCOSE, CAPILLARY
GLUCOSE-CAPILLARY: 94 mg/dL (ref 65–99)
Glucose-Capillary: 104 mg/dL — ABNORMAL HIGH (ref 65–99)
Glucose-Capillary: 118 mg/dL — ABNORMAL HIGH (ref 65–99)
Glucose-Capillary: 105 mg/dL — ABNORMAL HIGH (ref 65–99)

## 2017-11-28 NOTE — Plan of Care (Signed)
  Problem: RH PAIN MANAGEMENT Goal: RH STG PAIN MANAGED AT OR BELOW PT'S PAIN GOAL Description Less than 3,on 1 to 10 scale  Outcome: Progressing  Continue to administered prn pain regimen

## 2017-11-28 NOTE — Progress Notes (Signed)
Physical Therapy Session Note  Patient Details  Name: Kevin Bradford MRN: [id number] Date of Birth: 1945/11/09  Today's Date: 11/28/2017 PT Individual Time: 1413-1520 PT Individual Time Calculation (min): 67 min   Short Term Goals: Week 1:  PT Short Term Goal 1 (Week 1): STG =LTG due to ELOS   Skilled Therapeutic Interventions/Progress Updates:   Pt in supine and agreeable to therapy, no c/o pain. Session focused on d/c planning, discussion of home set-up, and negotiating stairs. Pt transferred to w/c via stand pivot w/ supervision and pt self-propelled w/c around unit w/ supervision. Discussed benefits of w/c use at home and in community for energy conservation and safety. Pt states he won't be leaving house, but more agreeable to w/c use when therapist brought up that he would have doctors appointments. Pt needed to toilet halfway through session. Practiced ambulating in and out of bathroom w/ supervision using RW. Pt performed sit<>stands and pericare w/ supervision. Close supervision and verbal cues for balance w/ unilateral UE support when pulling LE garments up over hips. Practiced transition of standing to sitting on stairs as per home set-up and bumped on bottom up stairs w/ UEs x12 steps. Supervision and occasional verbal cues for technique. Additionally discussed transition at top of stairs to chair, but pt declined practicing. He states "I will figure it out with what I have at home". Performed UE strengthening exercises per pt's request including 5# dowel shoulder flexion 2x10 and shoulder press 2x10. Pt states he has home gym equipment at home w/ "bands and pulleys", cautioned on performing all exercises at w/c level for safety. Both pt and wife verbalized understanding. Returned to room and ended session in w/c, call bell within reach and all needs met.   Therapy Documentation Precautions:  Precautions Precautions: Fall Restrictions Weight Bearing Restrictions: Yes LLE Weight  Bearing: Non weight bearing Vital Signs: Therapy Vitals Temp: 98.2 F (36.8 C) Temp Source: Oral Pulse Rate: 83 Resp: 16 BP: 119/74 Patient Position (if appropriate): Lying Oxygen Therapy SpO2: 97 % O2 Device: Room Air Pain: Pain Assessment Pain Scale: Faces Pain Score: 6  Faces Pain Scale: Hurts a little bit Pain Type: Surgical pain Pain Location: Leg Pain Orientation: Left Pain Descriptors / Indicators: Discomfort Pain Frequency: Intermittent Pain Onset: With Activity Patients Stated Pain Goal: 2 Pain Intervention(s): Emotional support Multiple Pain Sites: No  See Function Navigator for Current Functional Status.   Therapy/Group: Individual Therapy  Magally Vahle K Arnette 11/28/2017, 3:22 PM

## 2017-11-28 NOTE — Progress Notes (Signed)
[facility] PHYSICAL MEDICINE & REHABILITATION     PROGRESS NOTE    Subjective/Complaints: Patient seen lying in bed this morning. He states he slept well overnight. He denies complaints.  ROS: denies CP, SOB, N/V/D  Objective: Vital Signs: Blood pressure 118/71, pulse 61, temperature 98.3 F (36.8 C), temperature source Oral, resp. rate 18, height  (1.88 m), weight 98 kg (216 lb 0.8 oz), SpO2 98 %. No results found. Recent Labs    11/27/17 0817  WBC 5.4  HGB 12.2*  HCT 37.1*  PLT 236   Recent Labs    11/27/17 0817  NA 140  K 4.1  CL 106  GLUCOSE 150*  BUN 18  CREATININE 1.30*  CALCIUM 9.3   CBG (last 3)  Recent Labs    11/27/17 2032 11/27/17 2103 11/28/17 0645  GLUCAP 64* 90 104*    Wt Readings from Last 3 Encounters:  11/26/17 98 kg (216 lb 0.8 oz)  11/22/17 100.7 kg (222 lb)  11/09/17 100.7 kg (222 lb)    Physical Exam:  Constitutional: No distress . Vital signs reviewed. HENT: Normocephalic.  Atraumatic. Eyes: EOMI. No discharge. Cardiovascular: RRR. No JVD. Respiratory: CTA Bilaterally. Normal effort. GI: BS +. Non-distended. Musc: No edema or tenderness in extremities. Musculoskeletal:LLE tenderness Neurological: He isalert.  Motor: B/l UE 5/5 prox to distal.  RLE 4+/5 prox to distal (stable).  LLE: HF 4+/5 (stable) Skin:BKA site is dressed with wound VAC in place Psychiatric: He has anormal mood and affect.  Assessment/Plan: 1. Functional deficits secondary to left BKA which require 3+ hours per day of interdisciplinary therapy in a comprehensive inpatient rehab setting. Physiatrist is providing close team supervision and 24 hour management of active medical problems listed below. Physiatrist and rehab team continue to assess barriers to discharge/monitor patient progress toward functional and medical goals.  Function:  Bathing Bathing position   Position: Sitting EOB  Bathing parts Body parts bathed by patient: Right arm, Left  arm, Chest, Abdomen, Right upper leg, Left upper leg Body parts bathed by helper: Back  Bathing assist Assist Level: Touching or steadying assistance(Pt > 75%)(Mod assist FIM)      Upper Body Dressing/Undressing Upper body dressing   What is the patient wearing?: Pull over shirt/dress     Pull over shirt/dress - Perfomed by patient: Thread/unthread right sleeve, Thread/unthread left sleeve, Put head through opening, Pull shirt over trunk          Upper body assist Assist Level: Set up   Set up : To obtain clothing/put away  Lower Body Dressing/Undressing Lower body dressing   What is the patient wearing?: Pants, Socks, Shoes     Pants- Performed by patient: Thread/unthread right pants leg, Thread/unthread left pants leg, Pull pants up/down       Socks - Performed by patient: Don/doff right sock(Lt BKA)   Shoes - Performed by patient: Don/doff right shoe, Fasten right            Lower body assist Assist for lower body dressing: Touching or steadying assistance (Pt > 75%)      Toileting Toileting   Toileting steps completed by patient: Adjust clothing prior to toileting, Performs perineal hygiene, Adjust clothing after toileting      Toileting assist Assist level: Set up/obtain supplies(per Delena Bali [place], NT report)   Transfers Chair/bed transfer   Chair/bed transfer method: Squat pivot Chair/bed transfer assist level: Supervision or verbal cues Chair/bed transfer assistive device: Armrests     Locomotion Ambulation  Max distance: 50 Assist level: Supervision or verbal cues   Wheelchair   Type: Manual Max wheelchair distance: 150 Assist Level: No help, No cues, assistive device, takes more than reasonable amount of time  Cognition Comprehension Comprehension assist level: Follows complex conversation/direction with no assist  Expression Expression assist level: Expresses complex ideas: With no assist  Social Interaction Social Interaction assist  level: Interacts appropriately with others - No medications needed.  Problem Solving Problem solving assist level: Solves basic problems with no assist  Memory Memory assist level: Complete Independence: No helper   Medical Problem List and Plan: 1.Decreased functional mobilitysecondary to left BKA 11/22/2017. Postoperative wound VAC  Continue CIR 2. DVT Prophylaxis/Anticoagulation: Lovenox 3. Pain Management:Hydrocodone and Robaxin as needed 4. Mood:Provide emotional support 5. Neuropsych: This patientiscapable of making decisions on hisown behalf. 6. Skin/Wound Care:Routine skin checks Plan to d/c VAC ~ 5/15 Limb guard for protection/promote knee extension 7. Fluids/Electrolytes/Nutrition:Routine in and outs  8.Diabetes mellitus with peripheral neuropathy. Hemoglobin A1c 6.7. NovoLog 4 units 3 times daily, Glucophage 500 mg twice daily.   Levemir 30 units nightly, decreased to 25 units on 5/13 9.CKD stage III. Follow-up chemistries tomorrow  Cr 1.30 on 5/13 10.Constipation. Laxative assistance 11. Labile blood pressure   Remained labile and 5/14  Cont to monitor for trend 12. Acute blood loss anemia  Hb 12.2 on 5/13  Cont to monitor  LOS (Days) 4 A FACE TO FACE EVALUATION WAS PERFORMED  Jameria Bradway Karis Juba, MD 11/28/2017 8:09 AM

## 2017-11-28 NOTE — Progress Notes (Signed)
Hypoglycemic Event  CBG: 64  Treatment: snack  Symptoms: none  Follow-up CBG: Time 2100 CBG Result:90  Possible Reasons for Event: unknown  Comments/MD notified:[name] on rounds    Azalee Course

## 2017-11-28 NOTE — Progress Notes (Signed)
Occupational Therapy Session Note  Patient Details  Name: Kevin Bradford MRN: [id number] Date of Birth: 06/12/1946  Today's Date: 11/28/2017 OT Individual Time: 8295-6213 OT Individual Time Calculation (min): 56 min    Short Term Goals: Week 1:  OT Short Term Goal 1 (Week 1): STG = LTGs due to ELOS  Skilled Therapeutic Interventions/Progress Updates:    Pt completed transfer from the bed to the toilet with use of the RW and supervision.  He completed toileting tasks with distant supervision as well using the grab bars for sit to stand and the walker for standing balance.  Short distance mobility at supervision level completed as well over to the sink for washing his hands with supervision while standing.  He rested briefly on the EOB and then ambulated out into the hallway with the RW a short distance before requesting to sit.  Next had him roll his wheelchair down to the dayroom with modified independence where he engaged in use of the UE ergonometer.  He completed 2 sets of 5 mins each with resistance set on level 12 and rpms maintained at 25-30 per minute.  Finished session with return to the room and pt left in the wheelchair for next session.    Therapy Documentation Precautions:  Precautions Precautions: Fall Restrictions Weight Bearing Restrictions: Yes LLE Weight Bearing: Non weight bearing   Pain: Pain Assessment Pain Scale: Faces Pain Score: 6  Faces Pain Scale: Hurts a little bit Pain Type: Surgical pain Pain Location: Leg Pain Orientation: Left Pain Descriptors / Indicators: Discomfort Pain Frequency: Intermittent Pain Onset: With Activity Patients Stated Pain Goal: 2 Pain Intervention(s): Emotional support Multiple Pain Sites: No ADL: See Function Navigator for Current Functional Status.   Therapy/Group: Individual Therapy  Noha Milberger OTR/L 11/28/2017, 12:31 PM

## 2017-11-28 NOTE — Progress Notes (Addendum)
Social Work Assessment and Plan  Patient Details  Name: Kevin Bradford MRN: [id number] Date of Birth: [birth year]  Today's Date: 11/27/2017  Problem List:  Patient Active Problem List   Diagnosis Date Noted  . Acute blood loss anemia   . Labile blood pressure   . Stage 3 chronic kidney disease ([place])   . Type 2 diabetes mellitus with peripheral neuropathy (HCC)   . Post-operative pain   . Amputation of left lower extremity below knee upon examination ([place]) 11/24/2017  . Amputated below knee, left ([place]) 11/22/2017  . Osteomyelitis of left foot ([place])   . Dehiscence of amputation stump ([place]) 08/11/2017  . S/P transmetatarsal amputation of foot, left ([place]) 07/19/2017  . Subacute osteomyelitis, left ankle and foot ([place]) 07/17/2017  . Diabetic ulcer of left foot associated with type 2 diabetes mellitus ([place])   . Diabetes mellitus with renal complications ([place]) 01/60/1093  . Diabetes ([place]) 10/13/2014  . Anemia 10/13/2014  . CKD (chronic kidney disease) stage 3, GFR 30-59 ml/min (HCC) 10/13/2014  . Hyperkalemia 10/13/2014   Past Medical History:  Past Medical History:  Diagnosis Date  . Arthritis    "joint pain" (08/16/2017)  . Cellulitis and abscess of leg 10/18/2014  . CKD (chronic kidney disease) stage 3, GFR 30-59 ml/min (HCC)    creatinine increases with antibiotics per pt, no known kidney disease per patient  . Dehiscence of closure of skin, subsequent encounter   . Diabetic foot ulcer ([place])    left  . Diabetic peripheral neuropathy ([place])   . GERD (gastroesophageal reflux disease)   . Osteomyelitis ([place])    left transmetatarsal   . Type II diabetes mellitus ([place])    Past Surgical History:  Past Surgical History:  Procedure Laterality Date  . AMPUTATION Left 07/19/2017   Procedure: LEFT TRANSMETATARSAL AMPUTATION;  Surgeon: Newt Minion, MD;  Location: [facility];  Service: Orthopedics;  Laterality: Left;  . AMPUTATION Left 08/16/2017   REVISION LEFT TRANSMETATARSAL AMPUTATION   . AMPUTATION Left 11/22/2017   Procedure: LEFT BELOW KNEE AMPUTATION;  Surgeon: Newt Minion, MD;  Location: [facility];  Service: Orthopedics;  Laterality: Left;  . CATARACT EXTRACTION W/ INTRAOCULAR LENS  IMPLANT, BILATERAL  ~ 2016  . I&D EXTREMITY Left 10/17/2014   Procedure: IRRIGATION AND DEBRIDEMENT EXTREMITY LEFT FOOT;  Surgeon: Newt Minion, MD;  Location: [facility];  Service: Orthopedics;  Laterality: Left;  . STUMP REVISION Left 08/16/2017   Procedure: REVISION LEFT TRANSMETATARSAL AMPUTATION;  Surgeon: Newt Minion, MD;  Location: [facility];  Service: Orthopedics;  Laterality: Left;  . TOE AMPUTATION Right 2013   5th toe   Social History:  reports that he has never smoked. He has never used smokeless tobacco. He reports that he drinks alcohol. He reports that he does not use drugs.  Family / Support Systems Marital Status: Married How Long?: 22 years Patient Roles: Spouse, Parent, Other (Comment)(grandparent) Spouse/Significant Other: Adron Bene - wife - [phone] Children: Wife's children are here locally, pt's are in [place]. 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   Abuse/Neglect Abuse/Neglect Assessment Can Be Completed: Yes Physical Abuse: Denies Verbal Abuse: Denies Sexual Abuse:  Denies Exploitation of patient/patient's resources: Denies Self-Neglect: Denies  Emotional Status Pt's affect, behavior and adjustment status: Pt admits to this amputation being a "bump in the road", but is motivated to get through this  and move forward.  He's encouarged by his progress thus far and is motivated to get his independence back. Recent Psychosocial Issues: Pt reports feeling positive emotionally and is not experiencing negativity at this time. Psychiatric History: none reported Substance Abuse History: none reported  Patient / Family Perceptions, Expectations & Goals Pt/Family understanding of illness & functional limitations: Pt reports a good understanding of his condition and limitations. Premorbid pt/family roles/activities: Pt enjoys traveling and doing various outside activity - golfing, fishing, etc. Anticipated changes in roles/activities/participation: Pt would like to resume activities as he is able. Pt/family expectations/goals: Pt wants to get his independence back and to get his prosthesis and get on with life.  Community [employer] Agencies: None Premorbid Home Care/DME Agencies: Other (Comment)([facility] in the past.  Has a walker at home.) Transportation available at discharge: wife Resource referrals recommended: Support group (specify)(Amputee Support Group)  Discharge Planning Living Arrangements: Spouse/significant other Support Systems: Spouse/significant other, Children, Other relatives, Friends/neighbors Type of Residence: Private residence Insurance Resources: [employer], Multimedia programmer (specify)([employer]) Financial Resources: Radio broadcast assistant Screen Referred: No Money Management: Patient, Spouse Does the patient have any problems obtaining your medications?: No Home Management: Pt and wife share these responsibilities, but wife can manage while pt recovers. Patient/Family Preliminary Plans: Pt plans to return to his home with his wife to assist with supervision intermittently. Social Work Anticipated Follow Up Needs: HH/OP, Support Group Expected length of stay: 6-9 days  Clinical Impression CSW met with pt to introduce self and role  of CSW, as well as to complete assessment.  Pt was a little tired during visit, but was able to rally and talk with CSW.  Pt shared with CSW about going to college in [place] and learning to surf.  He is feeling positive about his recovery and knows he can move forward from here and work toward getting a prosthesis.  Pt has good family support and was doing some strength training prior to surgeries, so he feels it will help him with his rehabilitation.  Pt acknowledges that this was the best decision that he could make at the time and he is at peace with his decision.  Pt is hopeful to go home by the end of the week, but is accepting of whatever the team thinks he needs.  No current concerns, needs, questions at this time.  CSW will continue to follow and assist as needed.  Jaqueline Uber, Silvestre Mesi 11/28/2017, 9:08 AM

## 2017-11-28 NOTE — Progress Notes (Signed)
Last night, patient was informed of the bowel program & informed of prn miralax order. Patient stated that he did not think that he needed to take it. Patient had not had a bowel movement since 11/22/17. Patient was educated about the risks & benefits of using an aid if he could not eliminate his bowels. Patient stated that he did not have the urge, but did not want anything to give him the urge. Stated that no one took him to the restroom., but patient had not told staff that he needed to go for them to take him. Patient was insistant that he would go if he sat on the toilet. He was transferred to the toilet but refused for staff to stay in the room. Patient told other nurse that assisted him from the toilet that he did have a bowel movement, but it was not visualized by staff because he flushed it. Patient also refuses staff to stay in the room when he uses the urinal despite our policy. He calls on the call bell for staff to turn off the bed alarm & sits at the side of the bed pulls down his shorts, uses the urinal & then leans over to set the urinal on the floor. He then instructs staff to leave the room & come back in some minutes when he is finished to empty the urinal. He has an amputation with a wound vac, but does not listen to reason when it comes to his safety. No acute distress noted. Will continue to monitor.

## 2017-11-28 NOTE — Progress Notes (Signed)
Physical Therapy Session Note  Patient Details  Name: Kevin Bradford MRN: [id number] Date of Birth: [birth year]  Today's Date: 11/28/2017 PT Individual Time: 1000-1100 PT Individual Time Calculation (min): 60 min   Short Term Goals: Week 1:  PT Short Term Goal 1 (Week 1): STG =LTG due to ELOS   Skilled Therapeutic Interventions/Progress Updates: Pt received seated in w/c, c/o pain as below and agreeable to treatment. Performed ambulatory transfer with RW to/from car to simulate home environment, S overall. Pt verbalizes home setup and various ways to enter home that he was able to manage with help from his wife both in w/c and ambulatory while NWB after transmet amputations. Pt very confident in ability to manage in home d/t previous experience. W/c propulsion indoors/outdoors with BUE modI >500' performed for UE strengthening, aerobic endurance, and community access with w/c. Performed Squat pivot transfer w/c <>park bench with S. Performed nustep x8 min on level 9 with average 55 steps/min. Returned to room modI w/c propulsion. Squat pivot transfer to bed with modI. Remained supine in bed at end of session, alarm intact and all needs in reach.      Therapy Documentation Precautions:  Precautions Precautions: Fall Restrictions Weight Bearing Restrictions: Yes LLE Weight Bearing: Non weight bearing Pain: Pain Assessment Pain Scale: 0-10 Pain Score: 6  Pain Type: Surgical pain Pain Location: Leg Pain Orientation: Left Pain Descriptors / Indicators: Aching Pain Frequency: Intermittent Pain Onset: Gradual Patients Stated Pain Goal: 2 Pain Intervention(s): Medication (See eMAR) Multiple Pain Sites: No  See Function Navigator for Current Functional Status.   Therapy/Group: Individual Therapy  Vista Lawman 11/28/2017, 12:17 PM

## 2017-11-29 ENCOUNTER — Inpatient Hospital Stay (HOSPITAL_COMMUNITY): Payer: Medicare Other | Admitting: Physical Therapy

## 2017-11-29 ENCOUNTER — Encounter (HOSPITAL_COMMUNITY): Payer: Medicare Other | Admitting: Psychology

## 2017-11-29 ENCOUNTER — Inpatient Hospital Stay (HOSPITAL_COMMUNITY): Payer: Medicare Other | Admitting: Occupational Therapy

## 2017-11-29 LAB — GLUCOSE, CAPILLARY
GLUCOSE-CAPILLARY: 94 mg/dL (ref 65–99)
GLUCOSE-CAPILLARY: 98 mg/dL (ref 65–99)
GLUCOSE-CAPILLARY: 83 mg/dL (ref 65–99)
Glucose-Capillary: 180 mg/dL — ABNORMAL HIGH (ref 65–99)

## 2017-11-29 NOTE — Progress Notes (Signed)
Wound vac was d/c as per MD order,pt. Tolerated well.

## 2017-11-29 NOTE — Discharge Summary (Signed)
Discharge summary job # [id number]

## 2017-11-29 NOTE — Progress Notes (Signed)
Physical Therapy Session Note  Patient Details  Name: Kevin Bradford MRN: [id number] Date of Birth: January 01, 1946  Today's Date: 11/29/2017 PT Individual Time: 1115-1200 PT Individual Time Calculation (min): 45 min   Short Term Goals: Week 1:  PT Short Term Goal 1 (Week 1): STG =LTG due to ELOS   Skilled Therapeutic Interventions/Progress Updates:    Pt received seated in w/c in room, agreeable to PT. No complaints of pain. Manual w/c propulsion 2 x 150 ft with BUE Mod I navigating obstacles and tight spaces. Stand pivot transfer w/c to sitting on stairs with SBA. Pt is able to bump up/down 12 stairs on bottom with SBA for safety and to simulate home environment. Sit to stand from 2nd from the bottom stair to stand pivot transfer back to w/c, use of B handrails and SBA for safety. Ambulation x 50 ft with RW and SBA for safety. Pt does not wish to ambulate further to improve chances of maintaining skin integrity of RLE. Assisted pt back to bed at end of session, SBA to Mod I for stand pivot transfer, bed mobility Mod I. Pt left supine in bed with needs in reach.  Therapy Documentation Precautions:  Precautions Precautions: Fall Required Braces or Orthoses: (limb guard LLE) Restrictions Weight Bearing Restrictions: Yes LLE Weight Bearing: Non weight bearing  See Function Navigator for Current Functional Status.   Therapy/Group: Individual Therapy  Peter Congo, PT, DPT  11/29/2017, 12:02 PM

## 2017-11-29 NOTE — Progress Notes (Signed)
Occupational Therapy Discharge Summary  Patient Details  Name: Kevin Bradford MRN: [id number] Date of Birth: [birth year]  Today's Date: 11/29/2017 OT Individual Time: 0906-1003 OT Individual Time Calculation (min): 57 min   Session Note:  Pt transitioned to the EOB with modified independence and worked on donning and doffing his limb shrinker and the limb guard.  He needed max assist for donning limb shrinker with education of using a shrinker cylinder to assist with getting it all the way on to the end of the limb.  He was unable to tolerate putting it on and pulling it up secondary to increased pain in the end of the residual limb.  Emphasized the need to work on de-sensitization techniques for the left residual limb to increase ability to tolerate donning limb shrinker.  Next had pt transfer to the wheelchair squat pivot with modified independence.  He was able to gather some of his grooming items from wheelchair level with modified independence and complete grooming tasks of washing his hands and face as well as brushing his teeth at the sink.  He declined bathing or dressing, stating his wife would assist him with this at home.  Finished session with work on Mining engineer transfer using the tub bench as pt has at home.  He stated he felt the wheelchair will likely fit into the bathroom and over to the shower for transfer, but that if not he would "make the necessary adjustments".  He was able to complete transfer with supervision for wheelchair placement and for remembering to remove the arm rest.  Pt remained in wheelchair at end of session for next PT session directly after this OT session.     Patient has met 7 of 7 long term goals due to improved balance and ability to compensate for deficits.  Patient to discharge at overall Modified Independent level.  Patient's care partner is independent to provide the necessary physical assistance at discharge.    Reasons goals not met:  NA  Recommendation:  Patient will benefit from ongoing skilled OT services in home health setting to continue to advance functional skills in the area of BADL.  Pt is overall modified independent for selfcare tasks and functional transfers to the toilet.  He will have 24 hour supervision at home and can direct his spouse how to assist him as needed.  Recommend HHOT eval for safety and consideration of environmental obstacles and barriers that would impede independence.   Equipment: No equipment provided  Reasons for discharge: treatment goals met and discharge from hospital  Patient/family agrees with progress made and goals achieved: Yes  OT Discharge Precautions/Restrictions  Precautions Precautions: Fall Required Braces or Orthoses: Other Brace/Splint Other Brace/Splint: LLE limb guard Restrictions Weight Bearing Restrictions: Yes LLE Weight Bearing: Non weight bearing   Pain  Pt with pain with attempted donning of limb shrinker on the LLE ADL   See Function Section of chart for details  Vision Baseline Vision/History: Wears glasses Wears Glasses: Reading only Patient Visual Report: No change from baseline Vision Assessment?: No apparent visual deficits Perception  Perception: Within Functional Limits Praxis Praxis: Intact Cognition Overall Cognitive Status: Within Functional Limits for tasks assessed Arousal/Alertness: Awake/alert Orientation Level: Oriented X4 Memory: Appears intact Awareness: Appears intact Problem Solving: Appears intact Safety/Judgment: Appears intact Sensation Sensation Light Touch: Appears Intact Stereognosis: Appears Intact Hot/Cold: Appears Intact Proprioception: Appears Intact Additional Comments: Sensation grossly intact in BUEs Coordination Gross Motor Movements are Fluid and Coordinated: Yes Fine Motor Movements are Fluid and  Coordinated: Yes Motor  Motor Motor: Within Functional Limits Mobility    See Function Section of  chart for details  Trunk/Postural Assessment  Cervical Assessment Cervical Assessment: Within Functional Limits Thoracic Assessment Thoracic Assessment: Within Functional Limits Lumbar Assessment Lumbar Assessment: Within Functional Limits Postural Control Postural Control: Within Functional Limits  Balance Balance Balance Assessed: Yes Static Sitting Balance Static Sitting - Level of Assistance: 6: Modified independent (Device/Increase time) Dynamic Sitting Balance Dynamic Sitting - Level of Assistance: 6: Modified independent (Device/Increase time) Static Standing Balance Static Standing - Level of Assistance: 6: Modified independent (Device/Increase time) Dynamic Standing Balance Dynamic Standing - Level of Assistance: 6: Modified independent (Device/Increase time) Extremity/Trunk Assessment RUE Assessment RUE Assessment: Within Functional Limits LUE Assessment LUE Assessment: Within Functional Limits   See Function Navigator for Current Functional Status.  Keren Alverio OTR/L 11/29/2017, 4:26 PM

## 2017-11-29 NOTE — Discharge Summary (Signed)
NAMEJARRON, Kevin Bradford MEDICAL RECORD [id number] ACCOUNT [id number] DATE OF BIRTH:[birth year] FACILITY: [facility] LOC[facility]: [facility] PHYSICIAN:ANKIT PATEL, MD  DISCHARGE SUMMARY  DATE OF DISCHARGE:  11/30/2017  DISCHARGE DIAGNOSES: 1.  Left below knee amputation 11/22/2017. 2.  Subcutaneous Lovenox for deep venous thrombosis  prophylaxis. 3.  Pain management. 4.  Diabetes mellitus or peripheral neuropathy. 5.  Chronic kidney disease stage III. 6.  Constipation. 7.  Labile blood pressure. 8.  Acute blood loss anemia.  HISTORY OF PRESENT ILLNESS:  This is a 72 year old right-handed male, history of diabetes mellitus, CKD stage III, peripheral vascular disease with left transmetatarsal amputation in 07/19/2017 with revision 08/16/2017.  The patient lives with his wife,  2 level home.  Presented 11/22/2017 with progressive wound dehiscence as well as necrosis ulceration, osteomyelitis lateral left foot.  Underwent transmetatarsal amputation 11/22/2017 per Dr. Lajoyce Corners.  HOSPITAL COURSE:  Pain management, application postoperatively of wound VAC times 1 week.  Physical and occupational therapy evaluations completed.  The patient was admitted for a comprehensive rehabilitation program.  PAST MEDICAL HISTORY:  See discharge diagnoses.  SOCIAL HISTORY:  Lives with spouse, 2 level home.  FUNCTIONAL STATUS:  Upon admission to rehab services and was moderate assist, sit to stand, minimal guard stand pivot transfers and min/mod assist with activities of daily living.  PHYSICAL EXAMINATION: VITAL SIGNS:  Blood pressure 146/77, pulse 60, temperature 98, respirations 20.   GENERAL:  Alert male in no acute distress. HEENT:  EOMs intact. NECK:  Supple, nontender.  No JVD. HEART:  Rate controlled. ABDOMEN:  Soft, nontender, good bowel sounds. LUNGS:  Clear to auscultation without wheeze.   BKA site was dressed with a wound VAC in place, appropriately tender.  REHABILITATION HOSPITAL COURSE:  The patient  was admitted to inpatient rehabilitation services.  Therapies initiated on a 3-hour daily basis, consisting of physical therapy, occupational therapy and rehabilitation nursing.  The following issues were  addressed during patient's rehabilitation stay:  Pertaining to the  patient's left below knee amputation 11/22/2017.  Postoperative wound VAC and been removed.  He would follow up with orthopedic services, Dr. Lajoyce Corners  Neurovascular sensation intact.   Subcutaneous Lovenox initiated for DVT prophylaxis throughout his hospital course.  No bleeding episodes.  Pain management with the use of hydrocodone and Robaxin as needed.  Diabetes mellitus, peripheral neuropathy.  Hemoglobin A1c is 6.7.  The patient  remained on Levemir as well as Glucophage with full diabetic teaching.  He would follow up with his primary MD.  CKD stage III.  Creatinine 1.30 and stable.  Blood pressure is monitored.  No orthostatic changes.  Acute blood loss anemia 12.2.    The patient received weekly collaborative interdisciplinary team conferences to discuss estimated length of stay, family teaching, any barriers to discharge.  Sessions focused on energy conservation, home setup and stair negotiations.  Transfer to  wheelchair, stand pivot supervision.  Self-propelled his wheelchair, supervision.  Practice ambulation in and out of the bathroom with supervision using a rolling walker.  Performed sit to stand with pericare with supervision.  Close supervision for  balance.  Practice transition of standing to sitting on stairs as per home set up and bumped on bottom up stairs 12 steps supervision.  The patient could gather his belongings for activities of daily living and homemaking completed at sink side.  Full  family teaching was completed and planned discharge to home.  DISCHARGE MEDICATIONS:  Included aspirin 325 mg p.o. daily, Colace 100 mg p.o. b.i.d., Levemir 25 units at  bedtime, Glucophage 500 mg p.o. b.i.d., Protonix 40 mg p.o.  daily, Trental 400 mg p.o. t.i.d., hydrocodone 1-2 tablets every 4 hours as needed for  pain, Robaxin 500 mg p.o. every 6 hours as needed for muscle spasms.    His diet was a diabetic diet.    The patient would follow up with [name] at the outpatient rehab center as directed.  Dr. Aldean Baker - call for appointment.  Dr.  Donette Larry, medical management.  AN/NUANCE D:11/29/2017 T:11/29/2017 JOB:[id number]

## 2017-11-29 NOTE — Progress Notes (Signed)
Physical Therapy Discharge Summary  Patient Details  Name: Kevin Bradford MRN: [id number] Date of Birth: 29-Aug-1945  Today's Date: 11/29/2017 PT Individual Time: 1115-1200 PT Individual Time Calculation (min): 45 min    Patient has met 12 of 12 long term goals due to improved activity tolerance, improved balance, improved postural control, increased strength, increased range of motion, decreased pain, ability to compensate for deficits and improved awareness.  Patient to discharge at an ambulatory level Supervision, modI w/c level.  Patient's care partner is independent to provide the necessary physical assistance at discharge.  Reasons goals not met: All goals met  Recommendation:  Patient will benefit from ongoing skilled PT services in home health setting to continue to advance safe functional mobility, address ongoing impairments in strength, balance, ROM, activity tolerance, and minimize fall risk.  Equipment: w/c  Reasons for discharge: treatment goals met and discharge from hospital  Patient/family agrees with progress made and goals achieved: Yes  PT Discharge Precautions/Restrictions Precautions Precautions: Fall Required Braces or Orthoses: (limb guard LLE) Restrictions Weight Bearing Restrictions: Yes LLE Weight Bearing: Non weight bearing Vital Signs Therapy Vitals Temp: 98.2 F (36.8 C) Temp Source: Oral Pulse Rate: 60 Resp: 18 BP: 112/73 Patient Position (if appropriate): Lying Oxygen Therapy SpO2: 99 % O2 Device: Room Air Pain Pain Assessment Pain Scale: 0-10 Pain Score: 7  Pain Type: Surgical pain Pain Location: Leg Pain Orientation: Left Pain Descriptors / Indicators: Aching Pain Frequency: Occasional Pain Onset: Gradual Patients Stated Pain Goal: 2 Pain Intervention(s): Medication (See eMAR) Multiple Pain Sites: No Vision/Perception  Perception Perception: Within Functional Limits Praxis Praxis: Intact  Cognition Overall Cognitive Status:  Within Functional Limits for tasks assessed Arousal/Alertness: Awake/alert Orientation Level: Oriented X4 Memory: Appears intact Awareness: Appears intact Problem Solving: Appears intact Safety/Judgment: Appears intact Sensation Sensation Light Touch: Appears Intact Proprioception: Appears Intact Coordination Gross Motor Movements are Fluid and Coordinated: Yes Fine Motor Movements are Fluid and Coordinated: Yes Finger Nose Finger Test: WNL Heel Shin Test: Unable d/t amputation Motor  Motor Motor: Within Functional Limits  Mobility Bed Mobility Bed Mobility: Sit to Supine;Supine to Sit Rolling Right: 6: Modified independent (Device/Increase time) Rolling Left: 6: Modified independent (Device/Increase time) Supine to Sit: 6: Modified independent (Device/Increase time) Sit to Supine: 6: Modified independent (Device/Increase time) Transfers Transfers: Yes Sit to Stand: 6: Modified independent (Device/Increase time) Stand Pivot Transfers: 6: Modified independent (Device/Increase time) Squat Pivot Transfers: 6: Modified independent (Device/Increase time) Locomotion  Ambulation Ambulation: Yes Ambulation/Gait Assistance: 5: Supervision Ambulation Distance (Feet): 75 Feet Assistive device: Rolling walker Ambulation/Gait Assistance Details: Verbal cues for precautions/safety Gait Gait: Yes Gait Pattern: Impaired Gait Pattern: Poor foot clearance - right Gait velocity: decreased  Stairs / Additional Locomotion Stairs: Yes Stairs Assistance: 5: Supervision Stairs Assistance Details: Verbal cues for technique;Verbal cues for precautions/safety Stair Management Technique: Step to pattern Number of Stairs: 12 Height of Stairs: 6 Ramp: 5: Supervision Curb: 5: Psychiatric nurse: Yes Wheelchair Assistance: 6: Modified independent (Device/Increase time) Environmental health practitioner: Both upper extremities Wheelchair Parts Management: Independent   Trunk/Postural Assessment  Cervical Assessment Cervical Assessment: Within Water engineer Thoracic Assessment: Within Functional Limits Lumbar Assessment Lumbar Assessment: Within Functional Limits Postural Control Postural Control: Within Functional Limits  Balance Balance Balance Assessed: Yes Static Sitting Balance Static Sitting - Level of Assistance: 6: Modified independent (Device/Increase time) Dynamic Sitting Balance Dynamic Sitting - Level of Assistance: 6: Modified independent (Device/Increase time) Static Standing Balance Static Standing - Level of Assistance: 6: Modified  independent (Device/Increase time) Dynamic Standing Balance Dynamic Standing - Level of Assistance: 6: Modified independent (Device/Increase time) Extremity Assessment   BUE WFL for mobility tasks   RLE Assessment RLE Assessment: Within Functional Limits LLE AROM (degrees) LLE Overall AROM Comments: knee full extension. knee flexion ~110 degree LLE Strength LLE Overall Strength Comments: grossly 4-/5 proximal to distal with hypersensitive distal limb  Skilled Therapeutic Intervention: Tx 1: Pt received seated in bed, c/o pain and agreeable to treatment. Assessed bed mobility, car transfer, gait on ramp as above with modI/S with RW. Pt discussed hesitation with initiating any sort of desensitization techniques as instructed by OT this AM "until the surgeon explicitly tells me to do that", as he is very fearful of infection or doing anything to risk the integrity of the surgical site. Encouraged pt to follow up with surgeon soon as his extreme hyper-senstivity will be very limiting in prosthetic training if not managed efficiently. Instructed pt in HEP including hamstring stretch and long arc quads; provided handout for continued performance at home. Educated pt on importance of maintaining ROM and strength in LLE   Tx 2: Pt received seated in bed, denies pain and agreeable to  treatment. Bed mobility and transfer to w/c modI. Gait in/out of bathroom with RW and modI. Trial in new w/c; therapist adjusted leg rests and educated regarding various w/c parts and break down for transport. Performed remainder of LLE HEP including bridging on bolster, sidelying hip abduction 2x20 reps. Supine core strengthening exercise including thigh slides, bicycle crunches, seated russian twist with 2# weighted ball. Returned to w/c modI stand pivot, transfer back to bed modI. Remained supine in bed at end of session, no further questions or concerns regarding d/c at this time.    See Function Navigator for Current Functional Status.  Benjiman Core Tygielski 11/29/2017, 7:59 AM

## 2017-11-29 NOTE — Consult Note (Signed)
Neuropsychological Consultation   Patient:   Kevin Bradford   DOB:   12/09/1945  MR Number:  [id number]  Location:  [facility] [address] [facility] [id number] [place] [address] Dept: [phone] Loc: [phone]           Date of Service:   11/29/2017  Start Time:   3:30 PM End Time:   4:30 PM  Provider/Observer:  Arley Phenix, Psy.D.       Clinical Neuropsychologist       Billing Code/Service: [phone] 4 Units  Chief Complaint:    Kevin Bradford is a 72 year old right-handed male who has a history of chronic kidney disease, diabetes with peripheral neuropathy, peripheral vascular disease with left transmetatarsal amputation on 07/19/2017 and revision on 08/16/2017.  The patient presented on 11/22/2017 with progressive wound issues as well as necrosis ulceration osteomyelitis in his left foot.  The patient underwent transmetatarsal amputation on 11/22/2017.  The patient has had a number of concerns and frustrations and adjustment issues following his surgical intervention.  The patient acknowledges understanding intellectually what is going on and what the various protocols are within the rehabilitation program.  He reports that it is part of his inherent nature to question all the things are going on he does realize that he tends to go overboard with these issues and concerns.  The patient acknowledges that there are times where his overconcerns have had some negative impact on his efforts and rehabilitative interventions.  Reason for Service:  The patient was referred for neuropsychological/psychological consultation to help work on coping and adjustment issues following his lower leg amputation.  Below is the HPI for the current admission.  Kevin Bradford is a 71 year old right-handed male with history of CKD stage III, diabetes mellitus with peripheral neuropathy, peripheral vascular disease with left  transmetatarsal amputation 07/19/2017 and revision 08/16/2017. Per report patient lives with his wife. 2 level home with bedroom on Main level and one-step entry. Wife assists as needed. Presented 11/22/2017 with progressive wound dehiscence as well as necrosis ulceration osteomyelitis lateral left foot. Underwent transmetatarsal 11/22/2017 per Dr. Lajoyce Corners. Hospital course pain management. Application postoperatively of wound VAC. Physical and occupational therapy evaluations completed 11/23/2017 with recommendations of physical medicine rehab consult. Patient was admitted for a comprehensive rehab program.  Current Status:  The patient reports that he is less frustrated recently and feels like he is adjusting better.  The patient denies any symptoms of depression or anxiety.  He reports that it has been very difficult for him to be away from his home and aspects of his life that he controls.  The patient reports that he has adjusted to the loss of his leg and intellectually knows that this is the best medical course that could have been taken.  However, the patient reports that the realization that this procedure has been performed and that he is going to have to adjust to going forward has caused some frustration and difficulties not being able to control aspects of his care.  Behavioral Observation: Kevin Bradford  presents as a 72 y.o.-year-old Right Caucasian Male who appeared his stated age. his dress was Appropriate and he was Well Groomed and his manners were Appropriate to the situation.  his participation was indicative of Appropriate and Attentive behaviors.  There were any physical disabilities noted.  he displayed an appropriate level of cooperation and motivation.     Interactions:    Active Appropriate and Attentive  Attention:   within normal limits and attention span and concentration were age appropriate  Memory:   within normal limits; recent and remote memory  intact  Visuo-spatial:  within normal limits  Speech (Volume):  normal  Speech:   normal; normal  Thought Process:  Coherent and Relevant  Though Content:  WNL; not suicidal and not homicidal  Orientation:   person, place, time/date and situation  Judgment:   Good  Planning:   Good  Affect:    Irritable  Mood:    Dysphoric  Insight:   Fair  Intelligence:   high  Medical History:   Past Medical History:  Diagnosis Date  . Arthritis    "joint pain" (08/16/2017)  . Cellulitis and abscess of leg 10/18/2014  . CKD (chronic kidney disease) stage 3, GFR 30-59 ml/min (HCC)    creatinine increases with antibiotics per pt, no known kidney disease per patient  . Dehiscence of closure of skin, subsequent encounter   . Diabetic foot ulcer (HCC)    left  . Diabetic peripheral neuropathy (HCC)   . GERD (gastroesophageal reflux disease)   . Osteomyelitis (HCC)    left transmetatarsal   . Type II diabetes mellitus (HCC)    Psychiatric History:  The patient denies any prior history of depression or anxiety but does acknowledge a personality style that leads him to be controlling and sometimes he ends up being abrasive.  Family Med/Psych History:  Family History  Problem Relation Age of Onset  . Dementia Mother     Risk of Suicide/Violence: low the patient denies any suicidal or homicidal ideation.  Impression/DX:  Kevin Bradford is a 72 year old right-handed male who has a history of chronic kidney disease, diabetes with peripheral neuropathy, peripheral vascular disease with left transmetatarsal amputation on 07/19/2017 and revision on 08/16/2017.  The patient presented on 11/22/2017 with progressive wound issues as well as necrosis ulceration osteomyelitis in his left foot.  The patient underwent transmetatarsal amputation on 11/22/2017.  The patient has had a number of concerns and frustrations and adjustment issues following his surgical intervention.  The patient acknowledges  understanding intellectually what is going on and what the various protocols are within the rehabilitation program.  He reports that it is part of his inherent nature to question all the things are going on he does realize that he tends to go overboard with these issues and concerns.  The patient acknowledges that there are times where his overconcerns have had some negative impact on his efforts and rehabilitative interventions.  The patient reports that he is less frustrated recently and feels like he is adjusting better.  The patient denies any symptoms of depression or anxiety.  He reports that it has been very difficult for him to be away from his home and aspects of his life that he controls.  The patient reports that he has adjusted to the loss of his leg and intellectually knows that this is the best medical course that could have been taken.  However, the patient reports that the realization that this procedure has been performed and that he is going to have to adjust to going forward has caused some frustration and difficulties not being able to control aspects of his care.   Disposition/Plan:  We have worked on adjustment and coping issues around an extended hospital stay following his amputation.  The patient reports that it is becoming more apparent to him that he will need to relax some of his issues and concerns about  control over his overall situation.  The patient was not particularly defensive when addressing these issues and acknowledged his awareness of how some of his personality variables can impact his care during the rehabilitation program.  I will see the patient again next week if needed.  Diagnosis:    Amputation of left lower extremity below knee upon examination [facility]) - Plan: Ambulatory referral to Physical Medicine Rehab, Ambulatory referral to Physical Medicine Rehab         Electronically Signed   _______________________ Arley Phenix, Psy.D.

## 2017-11-29 NOTE — Progress Notes (Addendum)
[place] PHYSICAL MEDICINE & REHABILITATION     PROGRESS NOTE    Subjective/Complaints: Patient seen lying in bed this morning. He states he slept well overnight. He is happy about the prospective of going home tomorrow.  ROS: Denies CP, SOB, N/V/D  Objective: Vital Signs: Blood pressure 112/73, pulse 60, temperature 98.2 F (36.8 C), temperature source Oral, resp. rate 18, height  (1.88 m), weight 98 kg (216 lb 0.8 oz), SpO2 99 %. No results found. Recent Labs    11/27/17 0817  WBC 5.4  HGB 12.2*  HCT 37.1*  PLT 236   Recent Labs    11/27/17 0817  NA 140  K 4.1  CL 106  GLUCOSE 150*  BUN 18  CREATININE 1.30*  CALCIUM 9.3   CBG (last 3)  Recent Labs    11/28/17 1642 11/28/17 2202 11/29/17 0659  GLUCAP 118* 105* 94    Wt Readings from Last 3 Encounters:  11/26/17 98 kg (216 lb 0.8 oz)  11/22/17 100.7 kg (222 lb)  11/09/17 100.7 kg (222 lb)    Physical Exam:  Constitutional: No distress . Vital signs reviewed. HENT: Normocephalic.  Atraumatic. Eyes: EOMI. No discharge. Cardiovascular: RRR. No JVD. Respiratory: CTA Bilaterally. Normal effort. GI: BS +. Non-distended. Musc: No edema or tenderness in extremities. Musculoskeletal:LLE tenderness Neurological: He isalert.  Motor: B/l UE 5/5 prox to distal.  RLE 4+/5 prox to distal (unchanged).  LLE: HF 4+/5 (unchanged) Skin:BKA site is dressed with wound VAC in place Psychiatric: He has anormal mood and affect.  Assessment/Plan: 1. Functional deficits secondary to left BKA which require 3+ hours per day of interdisciplinary therapy in a comprehensive inpatient rehab setting. Physiatrist is providing close team supervision and 24 hour management of active medical problems listed below. Physiatrist and rehab team continue to assess barriers to discharge/monitor patient progress toward functional and medical goals.  Function:  Bathing Bathing position   Position: Sitting EOB  Bathing parts  Body parts bathed by patient: Right arm, Left arm, Chest, Abdomen, Right upper leg, Left upper leg Body parts bathed by helper: Back  Bathing assist Assist Level: Touching or steadying assistance(Pt > 75%)(Mod assist FIM)      Upper Body Dressing/Undressing Upper body dressing   What is the patient wearing?: Pull over shirt/dress     Pull over shirt/dress - Perfomed by patient: Thread/unthread right sleeve, Thread/unthread left sleeve, Put head through opening, Pull shirt over trunk          Upper body assist Assist Level: Set up   Set up : To obtain clothing/put away  Lower Body Dressing/Undressing Lower body dressing   What is the patient wearing?: Pants, Socks, Shoes     Pants- Performed by patient: Thread/unthread right pants leg, Thread/unthread left pants leg, Pull pants up/down       Socks - Performed by patient: Don/doff right sock(Lt BKA)   Shoes - Performed by patient: Don/doff right shoe, Fasten right            Lower body assist Assist for lower body dressing: Touching or steadying assistance (Pt > 75%)      Toileting Toileting   Toileting steps completed by patient: Adjust clothing prior to toileting, Performs perineal hygiene, Adjust clothing after toileting   Toileting Assistive Devices: Grab bar or rail  Toileting assist Assist level: Supervision or verbal cues   Transfers Chair/bed transfer   Chair/bed transfer method: Stand pivot Chair/bed transfer assist level: Supervision or verbal cues Chair/bed transfer assistive  device: Armrests, Patent attorney     Max distance: 15' Assist level: Supervision or verbal cues   Wheelchair   Type: Manual Max wheelchair distance: 150 Assist Level: No help, No cues, assistive device, takes more than reasonable amount of time  Cognition Comprehension Comprehension assist level: Understands basic 90% of the time/cues < 10% of the time  Expression Expression assist level: Expresses  complex 90% of the time/cues < 10% of the time  Social Interaction Social Interaction assist level: Interacts appropriately with others - No medications needed.  Problem Solving Problem solving assist level: Solves complex problems: Recognizes & self-corrects  Memory Memory assist level: Recognizes or recalls 90% of the time/requires cueing < 10% of the time   Medical Problem List and Plan: 1.Decreased functional mobilitysecondary to left BKA 11/22/2017.   Continue CIR, plan for DC tomorrow 2. DVT Prophylaxis/Anticoagulation: Lovenox 3. Pain Management:Hydrocodone and Robaxin as needed 4. Mood:Provide emotional support 5. Neuropsych: This patientiscapable of making decisions on hisown behalf. 6. Skin/Wound Care:Routine skin checks D/c VAC today Limb guard for protection/promote knee extension 7. Fluids/Electrolytes/Nutrition:Routine in and outs  8.Diabetes mellitus with peripheral neuropathy. Hemoglobin A1c 6.7. NovoLog 4 units 3 times daily, Glucophage 500 mg twice daily.   Levemir 30 units nightly, decreased to 25 units on 5/13   Controlled on 5/15 9.CKD stage III.   Cr 1.30 on 5/13 10.Constipation. Laxative assistance 11. Labile blood pressure   Relatively controlled on 5/15 12. Acute blood loss anemia  Hb 12.2 on 5/13  Cont to monitor  LOS (Days) 5 A FACE TO FACE EVALUATION WAS PERFORMED  Cadience Bradfield Karis Juba, MD 11/29/2017 8:32 AM

## 2017-11-30 DIAGNOSIS — R7309 Other abnormal glucose: Secondary | ICD-10-CM

## 2017-11-30 LAB — GLUCOSE, CAPILLARY
GLUCOSE-CAPILLARY: 75 mg/dL (ref 65–99)
Glucose-Capillary: 104 mg/dL — ABNORMAL HIGH (ref 65–99)

## 2017-11-30 MED ORDER — PANTOPRAZOLE SODIUM 40 MG PO TBEC: 40.0000 mg | DELAYED_RELEASE_TABLET | Freq: Every day | ORAL | 0 refills | Status: DC

## 2017-11-30 MED ORDER — INSULIN DETEMIR 100 UNIT/ML FLEXPEN: 25.0000 [IU] | PEN_INJECTOR | Freq: Every day | SUBCUTANEOUS | 11 refills | Status: DC

## 2017-11-30 MED ORDER — INSULIN ASPART 100 UNIT/ML FLEXPEN: 4.0000 [IU] | PEN_INJECTOR | Freq: Three times a day (TID) | SUBCUTANEOUS | 11 refills | Status: DC

## 2017-11-30 MED ORDER — PENTOXIFYLLINE ER 400 MG PO TBCR: 400.0000 mg | EXTENDED_RELEASE_TABLET | Freq: Three times a day (TID) | ORAL | 3 refills | Status: DC

## 2017-11-30 MED ORDER — METFORMIN HCL 500 MG PO TABS: 500.0000 mg | ORAL_TABLET | Freq: Two times a day (BID) | ORAL | 0 refills | Status: DC

## 2017-11-30 MED ORDER — METHOCARBAMOL 500 MG PO TABS: 500.0000 mg | ORAL_TABLET | Freq: Four times a day (QID) | ORAL | 0 refills | Status: DC | PRN

## 2017-11-30 MED ORDER — HYDROCODONE-ACETAMINOPHEN 5-325 MG PO TABS: 1.0000 | ORAL_TABLET | ORAL | 0 refills | Status: DC | PRN

## 2017-11-30 MED ORDER — ASPIRIN 325 MG PO TBEC: 325.0000 mg | DELAYED_RELEASE_TABLET | Freq: Every day | ORAL | 0 refills | Status: DC

## 2017-11-30 MED ORDER — DOCUSATE SODIUM 100 MG PO CAPS: 100.0000 mg | ORAL_CAPSULE | Freq: Two times a day (BID) | ORAL | 0 refills | Status: DC

## 2017-11-30 NOTE — Progress Notes (Signed)
Social Work Discharge Note  The overall goal for the admission was met for:   Discharge location: Yes - home with his wife  Length of Stay: Yes - 6 days  Discharge activity level: Yes - modified independent with supervision for shower, meal prep, ambulation, and stairs  Home/community participation: Yes  Services provided included: MD, RD, PT, OT, RN, Pharmacy, Neuropsych and SW  Financial Services: Medicare and Private Insurance: [place]  Follow-up services arranged: Home Health: RN/PT/OT, DME: 18"x18" wheelchair with left amputee support pad - two legs rest and basic cushion and Patient/Family request agency HH: [facility], DME: [facility]  Comments (or additional information): Pt to mod I with some supervision.  Wife has gone through family education with therapists.  Pt feels prepared to go home.  Patient/Family verbalized understanding of follow-up arrangements: Yes  Individual responsible for coordination of the follow-up plan: pt with his wife  Confirmed correct DME delivered: Trey Sailors 11/30/2017    Octave Montrose, Silvestre Mesi

## 2017-11-30 NOTE — Discharge Instructions (Signed)
Inpatient Rehab Discharge Instructions  [place] Discharge date and time: No discharge date for patient encounter.   Activities/Precautions/ Functional Status: Activity: activity as tolerated Diet: diabetic diet Wound Care: keep wound clean and dry Functional status:  ___ No restrictions     ___ Walk up steps independently ___ 24/7 supervision/assistance   ___ Walk up steps with assistance ___ Intermittent supervision/assistance  ___ Bathe/dress independently ___ Walk with walker     _x__ Bathe/dress with assistance ___ Walk Independently    ___ Shower independently ___ Walk with assistance    ___ Shower with assistance ___ No alcohol     ___ Return to work/school ________  COMMUNITY REFERRALS UPON DISCHARGE:   Home Health:   PT     OT     RN    Agency:  [facility] Phone:  [phone] Medical Equipment/Items Ordered:  18"x18" lightweight wheelchair with left amputee support pad and leg rests;  Basic cushion  Agency/Supplier:  [facility]           Phone:  [phone]  GENERAL COMMUNITY RESOURCES FOR PATIENT/FAMILY: Support Groups:  [facility] [place]                              Meets the second Tuesday of each month from 7-8:30 PM                              [facility]                              For more information, call [phone]  Special Instructions:    My questions have been answered and I understand these instructions. I will adhere to these goals and the provided educational materials after my discharge from the hospital.  Patient/Caregiver Signature _______________________________ Date __________  Clinician Signature _______________________________________ Date __________  Please bring this form and your medication list with you to all your follow-up doctor's appointments.

## 2017-11-30 NOTE — Progress Notes (Signed)
Patient discharged to home per wheelchair accompanied by NT . Discharge instructions done by [facility] . No further questions noted.

## 2017-11-30 NOTE — Progress Notes (Signed)
Social Work Patient ID: Kevin Bradford, male   DOB: 01-Nov-1945, 72 y.o.   MRN: [id number]   CSW met with pt 11-29-17 to confirm d/c date of 11-30-17 following conference.  Pt states he is ready.  He commented that his wife was a little apprehensive, but did not feel CSW should call to check in with her.  He stated she has gone through therapies with him and he felt her anxieties are normal to her adjusting to him coming home.  Dr. Sharol Given has already given pt signed handicapped placard application.  CSW remains available should needs arise.

## 2017-11-30 NOTE — Plan of Care (Signed)
  Problem: RH SAFETY Goal: RH STG ADHERE TO SAFETY PRECAUTIONS W/ASSISTANCE/DEVICE Description STG Adhere to Safety Precautions With Mod.Assistance/Device.  Outcome: Progress   Proper footwear, bed alarm,

## 2017-11-30 NOTE — Progress Notes (Signed)
Social Work Patient ID: Kevin Bradford, male   DOB: 09-11-1945, 72 y.o.   MRN: [id number]     Kevin Bradford, Kevin Bradford  Social Worker    Patient Care Conference  Signed  Date of Service:  11/30/2017 12:31 PM          Signed          Show:Clear all Manual[x] Template[] Copied  Added by: [name], [name], LCSW   Hover for details   Inpatient RehabilitationTeam Conference and Plan of Care Update Date: 11/29/2017   Time: 2:10 PM      Patient Name: Kevin Bradford      Medical Record Number: [id number]  Date of Birth: [birth year] Sex: Male         Room/Bed: 4M07C/4M07C-01 Payor Info: Payor: MEDICARE / Plan: MEDICARE PART A AND B / Product Type: *No Product type* /     Admitting Diagnosis: BKA  Admit Date/Time:  11/24/2017  3:13 PM Admission Comments: No comment available    Primary Diagnosis:  <principal problem not specified> Principal Problem: <principal problem not specified>       Patient Active Problem List    Diagnosis Date Noted  . Labile blood glucose    . Acute blood loss anemia    . Labile blood pressure    . Stage 3 chronic kidney disease (HCC)    . Type 2 diabetes mellitus with peripheral neuropathy (HCC)    . Post-operative pain    . Amputation of left lower extremity below knee upon examination (HCC) 11/24/2017  . Amputated below knee, left (HCC) 11/22/2017  . Osteomyelitis of left foot (HCC)    . Dehiscence of amputation stump (HCC) 08/11/2017  . S/P transmetatarsal amputation of foot, left (HCC) 07/19/2017  . Subacute osteomyelitis, left ankle and foot (HCC) 07/17/2017  . Diabetic ulcer of left foot associated with type 2 diabetes mellitus (HCC)    . Diabetes mellitus with renal complications (HCC) 10/17/2014  . Diabetes (HCC) 10/13/2014  . Anemia 10/13/2014  . CKD (chronic kidney disease) stage 3, GFR 30-59 ml/min (HCC) 10/13/2014  . Hyperkalemia 10/13/2014      Expected Discharge Date: Expected Discharge Date: 11/30/17   Team  Members Present: Physician leading conference: [name] Social Worker Present: [name], LCSW Nurse Present: Tennis Must, RN PT Present: [name], PT OT Present: [name], OT SLP Present: [name], SLP PPS Coordinator present : [name], RN, [name]       Current Status/Progress Goal Weekly Team Focus  Medical     Decreased functional mobility secondary to left BKA 11/22/2017.   Improve mobility, transfers, pain, CBGs  See above   Bowel/Bladder     Continent of Bowel and Bladder; LBM 11/28/17  Continue to assist with toileting needs with min assistance  Continue to assist with bathroom needs   Swallow/Nutrition/ Hydration               ADL's     supervision for all bathing, dressing, toilet transfers  modified independent to supervision  selfcare retraining, balance retraining, transfer retraining, therapeutic exercise, DME/AE education.     Mobility     modI bed mobility, S transfers and gait, min guard stairs  modI overall, S gait with LRAD and stairs  amputee education, LE ROM/strengthening, activity tolerance, gait/stair training   Communication               Safety/Cognition/ Behavioral Observations             Pain  Continue to rate pain 5/10 on pain scale of 0-10  Patient maintain pain at 3 or less  Continue to provide patient with pain medications and alternative pain relief methods as requested and continue to assess patient for pain   Skin     Wound vac at continuous to left BKA; 5TH Metarsals of rt foot amputation  Wound vac to remain on continuous  to left BKA  Continue to monitor wound vac and make necessary dressing changes; continue to assess skin     Rehab Goals Patient on target to meet rehab goals: Yes Rehab Goals Revised: none *See Care Plan and progress notes for long and short-term goals.      Barriers to Discharge   Current Status/Progress Possible Resolutions Date Resolved   Physician     Medical  stability;Wound Care;Weight bearing restrictions     See above  Therapies, optimize DM meds      Nursing                 PT                    OT                 SLP            SW              Discharge Planning/Teaching Needs:  Pt to return to his home with his wife to assist him as needed.  Pt's wife has received family education.   Team Discussion:  [name] has decreased pt's diabetes medications, as he is currently well controlled.  Blood pressure is stabilizing and wound VAC d/c'd.  Pt is supervision to mod I with OT and pt questions a lot and likes to do things his way with ADLs.  Pt is at same level with PT, but needs supervision for stairs.  Pt is hypersensitive with residual limb, but doesn't want to take medications or practice desensitization until the surgeon says it's okay to do so.  Revisions to Treatment Plan:  none    Continued Need for Acute Rehabilitation Level of Care: The patient requires daily medical management by a physician with specialized training in physical medicine and rehabilitation for the following conditions: Daily direction of a multidisciplinary physical rehabilitation program to ensure safe treatment while eliciting the highest outcome that is of practical value to the patient.: Yes Daily medical management of patient stability for increased activity during participation in an intensive rehabilitation regime.: Yes Daily analysis of laboratory values and/or radiology reports with any subsequent need for medication adjustment of medical intervention for : Post surgical problems;Wound care problems;Diabetes problems   Kevin Bradford, [name] 11/30/2017, 12:31 PM

## 2017-11-30 NOTE — Progress Notes (Signed)
[facility] PHYSICAL MEDICINE & REHABILITATION     PROGRESS NOTE    Subjective/Complaints: Patient seen sitting up in his chair this morning. He states he slept well overnight. He states he is ready for discharge.  ROS: denies CP, SOB, N/V/D  Objective: Vital Signs: Blood pressure 126/80, pulse 71, temperature 97.8 F (36.6 C), temperature source Oral, resp. rate 18, height  (1.88 m), weight 98 kg (216 lb 0.8 oz), SpO2 99 %. No results found. No results for input(s): WBC, HGB, HCT, PLT in the last 72 hours. No results for input(s): NA, K, CL, GLUCOSE, BUN, CREATININE, CALCIUM in the last 72 hours.  Invalid input(s): CO CBG (last 3)  Recent Labs    11/29/17 1638 11/29/17 2216 11/30/17 0634  GLUCAP 83 180* 104*    Wt Readings from Last 3 Encounters:  11/26/17 98 kg (216 lb 0.8 oz)  11/22/17 100.7 kg (222 lb)  11/09/17 100.7 kg (222 lb)    Physical Exam:  Constitutional: No distress . Vital signs reviewed. HENT: Normocephalic.  Atraumatic. Eyes: EOMI. No discharge. Cardiovascular: RRR. No JVD. Respiratory: CTA Bilaterally. Normal effort. GI: BS +. Non-distended. Musc: No edema or tenderness in extremities. Musculoskeletal:LLE tenderness Neurological: He isalert.  Motor: B/l UE 5/5 prox to distal.  RLE 4+/5 prox to distal (stable).  LLE: HF 4+/5 (stable) Skin:BKA with staples and minimal serosanguineous drainage from medial incision Psychiatric: He has anormal mood and affect.  Assessment/Plan: 1. Functional deficits secondary to left BKA which require 3+ hours per day of interdisciplinary therapy in a comprehensive inpatient rehab setting. Physiatrist is providing close team supervision and 24 hour management of active medical problems listed below. Physiatrist and rehab team continue to assess barriers to discharge/monitor patient progress toward functional and medical goals.  Function:  Bathing Bathing position   Position: Wheelchair/chair at sink   Bathing parts Body parts bathed by patient: Right arm, Left arm, Chest, Abdomen, Front perineal area, Buttocks, Right upper leg, Left upper leg, Right lower leg, Back(simulated as pt declined to attempt) Body parts bathed by helper: Left lower leg  Bathing assist Assist Level: Touching or steadying assistance(Pt > 75%)(Mod assist FIM)      Upper Body Dressing/Undressing Upper body dressing   What is the patient wearing?: Pull over shirt/dress     Pull over shirt/dress - Perfomed by patient: Thread/unthread right sleeve, Thread/unthread left sleeve, Put head through opening, Pull shirt over trunk          Upper body assist Assist Level: More than reasonable time   Set up : To obtain clothing/put away  Lower Body Dressing/Undressing Lower body dressing   What is the patient wearing?: Pants, Socks, Shoes     Pants- Performed by patient: Thread/unthread right pants leg, Thread/unthread left pants leg, Pull pants up/down(simulated)       Socks - Performed by patient: Don/doff right sock Socks - Performed by helper: Don/doff left sock(shrinker sock) Shoes - Performed by patient: Don/doff right shoe(left shoe NA)            Lower body assist Assist for lower body dressing: More than reasonable time      Toileting Toileting   Toileting steps completed by patient: Adjust clothing prior to toileting, Performs perineal hygiene, Adjust clothing after toileting   Toileting Assistive Devices: Grab bar or rail  Toileting assist Assist level: More than reasonable time   Transfers Chair/bed transfer   Chair/bed transfer method: Squat pivot Chair/bed transfer assist level: No Help, no cues,  assistive device, takes more than a reasonable amount of time Chair/bed transfer assistive device: Armrests     Locomotion Ambulation     Max distance: 75 Assist level: Supervision or verbal cues   Wheelchair   Type: Manual Max wheelchair distance: 150' Assist Level: No help, No cues,  assistive device, takes more than reasonable amount of time  Cognition Comprehension Comprehension assist level: Follows complex conversation/direction with no assist  Expression Expression assist level: Expresses complex ideas: With no assist  Social Interaction Social Interaction assist level: Interacts appropriately with others - No medications needed.  Problem Solving Problem solving assist level: Solves complex problems: Recognizes & self-corrects  Memory Memory assist level: Complete Independence: No helper   Medical Problem List and Plan: 1.Decreased functional mobilitysecondary to left BKA 11/22/2017.   D/c today  Will see patient for transitional care management in 1-2 weeks 2. DVT Prophylaxis/Anticoagulation: Lovenox 3. Pain Management:Hydrocodone and Robaxin as needed 4. Mood:Provide emotional support 5. Neuropsych: This patientiscapable of making decisions on hisown behalf. 6. Skin/Wound Care:Routine skin checks D/ced VAC on 5/15 Limb guard for protection/promote knee extension 7. Fluids/Electrolytes/Nutrition:Routine in and outs  8.Diabetes mellitus with peripheral neuropathy. Hemoglobin A1c 6.7. NovoLog 4 units 3 times daily, Glucophage 500 mg twice daily.   Levemir 30 units nightly, decreased to 25 units on 5/13   Labile on 5/16, otherwise relatively controlled 9.CKD stage III.   Cr 1.30 on 5/13 10.Constipation. Laxative assistance 11. Labile blood pressure   Relatively controlled on 5/16 12. Acute blood loss anemia  Hb 12.2 on 5/13  Cont to monitor  LOS (Days) 6 A FACE TO FACE EVALUATION WAS PERFORMED  Ankit Karis Juba, MD 11/30/2017 8:42 AM

## 2017-11-30 NOTE — Patient Care Conference (Signed)
Inpatient RehabilitationTeam Conference and Plan of Care Update Date: 11/29/2017   Time: 2:10 PM    Patient Name: Kevin Bradford      Medical Record Number: [id number]  Date of Birth: 1946-07-11 Sex: Male         Room/Bed: 4M07C/4M07C-01 Payor Info: Payor: MEDICARE / Plan: MEDICARE PART A AND B / Product Type: *No Product type* /    Admitting Diagnosis: BKA  Admit Date/Time:  11/24/2017  3:13 PM Admission Comments: No comment available   Primary Diagnosis:  <principal problem not specified> Principal Problem: <principal problem not specified>  Patient Active Problem List   Diagnosis Date Noted  . Labile blood glucose   . Acute blood loss anemia   . Labile blood pressure   . Stage 3 chronic kidney disease (HCC)   . Type 2 diabetes mellitus with peripheral neuropathy (HCC)   . Post-operative pain   . Amputation of left lower extremity below knee upon examination (HCC) 11/24/2017  . Amputated below knee, left (HCC) 11/22/2017  . Osteomyelitis of left foot (HCC)   . Dehiscence of amputation stump (HCC) 08/11/2017  . S/P transmetatarsal amputation of foot, left (HCC) 07/19/2017  . Subacute osteomyelitis, left ankle and foot (HCC) 07/17/2017  . Diabetic ulcer of left foot associated with type 2 diabetes mellitus (HCC)   . Diabetes mellitus with renal complications (HCC) 10/17/2014  . Diabetes (HCC) 10/13/2014  . Anemia 10/13/2014  . CKD (chronic kidney disease) stage 3, GFR 30-59 ml/min (HCC) 10/13/2014  . Hyperkalemia 10/13/2014    Expected Discharge Date: Expected Discharge Date: 11/30/17  Team Members Present: Physician leading conference: [name] Social Worker Present: [name], LCSW Nurse Present: Tennis Must, RN PT Present: [name], PT OT Present: [name], OT SLP Present: [name], SLP PPS Coordinator present : [name], RN, [name]     Current Status/Progress Goal Weekly Team Focus  Medical   Decreased functional mobility secondary to  left BKA 11/22/2017.   Improve mobility, transfers, pain, CBGs  See above   Bowel/Bladder   Continent of Bowel and Bladder; LBM 11/28/17  Continue to assist with toileting needs with min assistance  Continue to assist with bathroom needs   Swallow/Nutrition/ Hydration             ADL's   supervision for all bathing, dressing, toilet transfers  modified independent to supervision  selfcare retraining, balance retraining, transfer retraining, therapeutic exercise, DME/AE education.     Mobility   modI bed mobility, S transfers and gait, min guard stairs  modI overall, S gait with LRAD and stairs  amputee education, LE ROM/strengthening, activity tolerance, gait/stair training   Communication             Safety/Cognition/ Behavioral Observations            Pain   Continue to rate pain 5/10 on pain scale of 0-10  Patient maintain pain at 3 or less  Continue to provide patient with pain medications and alternative pain relief methods as requested and continue to assess patient for pain   Skin   Wound vac at continuous to left BKA; 5TH Metarsals of rt foot amputation  Wound vac to remain on continuous  to left BKA  Continue to monitor wound vac and make necessary dressing changes; continue to assess skin    Rehab Goals Patient on target to meet rehab goals: Yes Rehab Goals Revised: none *See Care Plan and progress notes for long and short-term goals.  Barriers to Discharge  Current Status/Progress Possible Resolutions Date Resolved   Physician    Medical stability;Wound Care;Weight bearing restrictions     See above  Therapies, optimize DM meds      Nursing                  PT                    OT                  SLP                SW                Discharge Planning/Teaching Needs:  Pt to return to his home with his wife to assist him as needed.  Pt's wife has received family education.   Team Discussion:  [name] has decreased pt's diabetes  medications, as he is currently well controlled.  Blood pressure is stabilizing and wound VAC d/c'd.  Pt is supervision to mod I with OT and pt questions a lot and likes to do things his way with ADLs.  Pt is at same level with PT, but needs supervision for stairs.  Pt is hypersensitive with residual limb, but doesn't want to take medications or practice desensitization until the surgeon says it's okay to do so.  Revisions to Treatment Plan:  none    Continued Need for Acute Rehabilitation Level of Care: The patient requires daily medical management by a physician with specialized training in physical medicine and rehabilitation for the following conditions: Daily direction of a multidisciplinary physical rehabilitation program to ensure safe treatment while eliciting the highest outcome that is of practical value to the patient.: Yes Daily medical management of patient stability for increased activity during participation in an intensive rehabilitation regime.: Yes Daily analysis of laboratory values and/or radiology reports with any subsequent need for medication adjustment of medical intervention for : Post surgical problems;Wound care problems;Diabetes problems  Glory Graefe, [name] 11/30/2017, 12:31 PM

## 2017-12-01 ENCOUNTER — Telehealth: Payer: Self-pay | Admitting: *Deleted

## 2017-12-01 NOTE — Telephone Encounter (Signed)
Transitional Care call-I spoke with MR Reddix.   1. Are you/is patient experiencing any problems since coming home? Are there any questions regarding any aspect of care?NO "He just needs his rest and we were interrupting " 2. Are there any questions regarding medications administration/dosing? Are meds being taken as prescribed? Patient should review meds with caller to confirm Yes he has medications 3. Have there been any falls? NO 4. Has Home Health been to the house and/or have they contacted you? If not, have you tried to contact them? Can we help you contact them? NO they have not contacted him yet that he is aware though he just got home yesterday evening. 5. Are bowels and bladder emptying properly? Are there any unexpected incontinence issues? If applicable, is patient following bowel/bladder programs?NO PROBLEMS 6. Any fevers, problems with breathing, unexpected pain? NO 7. Are there any skin problems or new areas of breakdown? NO 8. Has the patient/family member arranged specialty MD follow up (ie cardiology/neurology/renal/surgical/etc)?  Can we help arrange?  Has appts with Dr Lajoyce Corners and I have given him appt with Dr Allena Katz.  He has not made appt to see PCP yet. 9. Does the patient need any other services or support that we can help arrange? NO 10. Are caregivers following through as expected in assisting the patient?YES  11. Has the patient quit smoking, drinking alcohol, or using drugs as recommended?N/A Appointment Thursday May 30 :20 arrive by 10:00 to see Dr Allena Katz. Address reviewed and notified to look for info in mail and expect ROBO call about appt a few days before. [id number]

## 2017-12-03 DIAGNOSIS — Z79891 Long term (current) use of opiate analgesic: Secondary | ICD-10-CM | POA: Diagnosis not present

## 2017-12-03 DIAGNOSIS — Z89512 Acquired absence of left leg below knee: Secondary | ICD-10-CM | POA: Diagnosis not present

## 2017-12-03 DIAGNOSIS — Z794 Long term (current) use of insulin: Secondary | ICD-10-CM | POA: Diagnosis not present

## 2017-12-03 DIAGNOSIS — Z4781 Encounter for orthopedic aftercare following surgical amputation: Secondary | ICD-10-CM | POA: Diagnosis not present

## 2017-12-03 DIAGNOSIS — E1122 Type 2 diabetes mellitus with diabetic chronic kidney disease: Secondary | ICD-10-CM | POA: Diagnosis not present

## 2017-12-03 DIAGNOSIS — E1151 Type 2 diabetes mellitus with diabetic peripheral angiopathy without gangrene: Secondary | ICD-10-CM | POA: Diagnosis not present

## 2017-12-03 DIAGNOSIS — K219 Gastro-esophageal reflux disease without esophagitis: Secondary | ICD-10-CM | POA: Diagnosis not present

## 2017-12-03 DIAGNOSIS — M1990 Unspecified osteoarthritis, unspecified site: Secondary | ICD-10-CM | POA: Diagnosis not present

## 2017-12-03 DIAGNOSIS — D62 Acute posthemorrhagic anemia: Secondary | ICD-10-CM | POA: Diagnosis not present

## 2017-12-03 DIAGNOSIS — N183 Chronic kidney disease, stage 3 (moderate): Secondary | ICD-10-CM | POA: Diagnosis not present

## 2017-12-04 ENCOUNTER — Telehealth (INDEPENDENT_AMBULATORY_CARE_PROVIDER_SITE_OTHER): Payer: Self-pay | Admitting: Orthopedic Surgery

## 2017-12-04 DIAGNOSIS — D62 Acute posthemorrhagic anemia: Secondary | ICD-10-CM | POA: Diagnosis not present

## 2017-12-04 DIAGNOSIS — M1990 Unspecified osteoarthritis, unspecified site: Secondary | ICD-10-CM | POA: Diagnosis not present

## 2017-12-04 DIAGNOSIS — E1122 Type 2 diabetes mellitus with diabetic chronic kidney disease: Secondary | ICD-10-CM | POA: Diagnosis not present

## 2017-12-04 DIAGNOSIS — Z4781 Encounter for orthopedic aftercare following surgical amputation: Secondary | ICD-10-CM | POA: Diagnosis not present

## 2017-12-04 DIAGNOSIS — N183 Chronic kidney disease, stage 3 (moderate): Secondary | ICD-10-CM | POA: Diagnosis not present

## 2017-12-04 DIAGNOSIS — E1151 Type 2 diabetes mellitus with diabetic peripheral angiopathy without gangrene: Secondary | ICD-10-CM | POA: Diagnosis not present

## 2017-12-04 NOTE — Telephone Encounter (Signed)
Kevin Bradford (PT) with [facility] called needing verbal orders for HHPT  For Pre- Gait activity, strenghtening, transfers, balance 1 wk 1, 2 wk 2 and 1 wk 2. The number to contact Tomma Lightning is [phone]

## 2017-12-05 ENCOUNTER — Ambulatory Visit (INDEPENDENT_AMBULATORY_CARE_PROVIDER_SITE_OTHER): Payer: Medicare Other | Admitting: Orthopedic Surgery

## 2017-12-05 ENCOUNTER — Encounter (INDEPENDENT_AMBULATORY_CARE_PROVIDER_SITE_OTHER): Payer: Self-pay | Admitting: Orthopedic Surgery

## 2017-12-05 VITALS — Ht 74.0 in | Wt 216.0 lb

## 2017-12-05 DIAGNOSIS — Z89512 Acquired absence of left leg below knee: Secondary | ICD-10-CM

## 2017-12-05 NOTE — Telephone Encounter (Signed)
Called and lm on vm to advise verbal ok for PT orders as below. To call with questions.

## 2017-12-05 NOTE — Progress Notes (Signed)
Office Visit Note   Patient: Kevin Bradford           Date of Birth: 1945/10/21           MRN: [id number] Visit Date: 12/05/2017              Requested by: Georgann Housekeeper, MD [address] [place], [place] [address] PCP: Georgann Housekeeper, MD  Chief Complaint  Patient presents with  . Left Leg - Routine Post Op    11/22/17 left BKA       HPI: Patient presents 2 weeks status post left transtibial amputation he is pleased with his progress.  He is currently wearing the medical compression stump shrinker.  Assessment & Plan: Visit Diagnoses:  1. Acquired absence of left leg below knee (HCC)     Plan: Continue with the stump shrinker start Dial soap cleansing continue with the stump protector he will go to [employer] to get new stump shrinker's.  Follow-up in 1 week to harvest the sutures.  Follow-Up Instructions: Return in about 1 week (around 12/12/2017).   Ortho Exam  Patient is alert, oriented, no adenopathy, well-dressed, normal affect, normal respiratory effort. Examination he has full extension the incision is well approximated there is no cellulitis no signs of infection no ischemic changes there is about 2 to 3 mm of some pink tissue on the inferior flap but no signs of infection.  Imaging: No results found. No images are attached to the encounter.  Labs: Lab Results  Component Value Date   HGBA1C 6.7 (H) 11/22/2017   HGBA1C 9.3 (H) 07/19/2017   HGBA1C 11.3 (H) 10/14/2014   ESRSEDRATE 119 (H) 10/18/2014   ESRSEDRATE 115 (H) 10/13/2014   CRP 8.5 (H) 10/18/2014   CRP 21.9 (H) 10/13/2014   LABURIC 3.3 (L) 10/14/2014   REPTSTATUS 07/24/2017 FINAL 07/19/2017   GRAMSTAIN  07/19/2017    FEW WBC PRESENT, PREDOMINANTLY MONONUCLEAR MODERATE GRAM POSITIVE COCCI IN PAIRS MODERATE GRAM NEGATIVE RODS    CULT  07/19/2017    MODERATE ENTEROCOCCUS FAECALIS FEW METHICILLIN RESISTANT STAPHYLOCOCCUS AUREUS MODERATE BACTEROIDES ORALIS BETA LACTAMASE POSITIVE    LABORGA METHICILLIN RESISTANT STAPHYLOCOCCUS AUREUS 07/19/2017   LABORGA ENTEROCOCCUS FAECALIS 07/19/2017     Lab Results  Component Value Date   ALBUMIN 3.5 11/27/2017   ALBUMIN 3.6 08/16/2017   ALBUMIN 2.4 (L) 10/14/2014   LABURIC 3.3 (L) 10/14/2014    Body mass index is 27.73 kg/m.  Orders:  No orders of the defined types were placed in this encounter.  No orders of the defined types were placed in this encounter.    Procedures: No procedures performed  Clinical Data: No additional findings.  ROS:  All other systems negative, except as noted in the HPI. Review of Systems  Objective: Vital Signs: Ht  (1.88 m)   Wt 216 lb (98 kg)   BMI 27.73 kg/m   Specialty Comments:  No specialty comments available.  PMFS History: Patient Active Problem List   Diagnosis Date Noted  . Labile blood glucose   . Acute blood loss anemia   . Labile blood pressure   . Stage 3 chronic kidney disease (HCC)   . Type 2 diabetes mellitus with peripheral neuropathy (HCC)   . Post-operative pain   . Amputation of left lower extremity below knee upon examination (HCC) 11/24/2017  . Amputated below knee, left (HCC) 11/22/2017  . Osteomyelitis of left foot (HCC)   . Dehiscence of amputation stump (HCC) 08/11/2017  .  S/P transmetatarsal amputation of foot, left (HCC) 07/19/2017  . Subacute osteomyelitis, left ankle and foot (HCC) 07/17/2017  . Diabetic ulcer of left foot associated with type 2 diabetes mellitus (HCC)   . Diabetes mellitus with renal complications (HCC) 10/17/2014  . Diabetes (HCC) 10/13/2014  . Anemia 10/13/2014  . CKD (chronic kidney disease) stage 3, GFR 30-59 ml/min (HCC) 10/13/2014  . Hyperkalemia 10/13/2014   Past Medical History:  Diagnosis Date  . Arthritis    "joint pain" (08/16/2017)  . Cellulitis and abscess of leg 10/18/2014  . CKD (chronic kidney disease) stage 3, GFR 30-59 ml/min (HCC)    creatinine increases with antibiotics per pt, no known  kidney disease per patient  . Dehiscence of closure of skin, subsequent encounter   . Diabetic foot ulcer (HCC)    left  . Diabetic peripheral neuropathy (HCC)   . GERD (gastroesophageal reflux disease)   . Osteomyelitis (HCC)    left transmetatarsal   . Type II diabetes mellitus (HCC)     Family History  Problem Relation Age of Onset  . Dementia Mother     Past Surgical History:  Procedure Laterality Date  . AMPUTATION Left 07/19/2017   Procedure: LEFT TRANSMETATARSAL AMPUTATION;  Surgeon: Nadara Mustard, MD;  Location: [facility] OR;  Service: Orthopedics;  Laterality: Left;  . AMPUTATION Left 08/16/2017   REVISION LEFT TRANSMETATARSAL AMPUTATION  . AMPUTATION Left 11/22/2017   Procedure: LEFT BELOW KNEE AMPUTATION;  Surgeon: Nadara Mustard, MD;  Location: [facility] OR;  Service: Orthopedics;  Laterality: Left;  . CATARACT EXTRACTION W/ INTRAOCULAR LENS  IMPLANT, BILATERAL  ~ 2016  . I&D EXTREMITY Left 10/17/2014   Procedure: IRRIGATION AND DEBRIDEMENT EXTREMITY LEFT FOOT;  Surgeon: Nadara Mustard, MD;  Location: [facility] OR;  Service: Orthopedics;  Laterality: Left;  . STUMP REVISION Left 08/16/2017   Procedure: REVISION LEFT TRANSMETATARSAL AMPUTATION;  Surgeon: Nadara Mustard, MD;  Location: [facility] OR;  Service: Orthopedics;  Laterality: Left;  . TOE AMPUTATION Right 2013   5th toe   Social History   Occupational History  . Not on file  Tobacco Use  . Smoking status: Never Smoker  . Smokeless tobacco: Never Used  Substance and Sexual Activity  . Alcohol use: Yes    Comment: 11/21/2017 "maybe 1 beer 2 times a month"  . Drug use: No  . Sexual activity: Yes

## 2017-12-07 ENCOUNTER — Telehealth (INDEPENDENT_AMBULATORY_CARE_PROVIDER_SITE_OTHER): Payer: Self-pay | Admitting: Orthopedic Surgery

## 2017-12-07 NOTE — Telephone Encounter (Signed)
Kevin Bradford with [facility] is requesting continued skilled nursing visits for this patient. CB # [phone]

## 2017-12-07 NOTE — Telephone Encounter (Signed)
Verbal ok for [facility] visits. BKA  2 x q wk x 2 wks and then 1 x q wk x 7 weeks if its not needed they will d/c pt.

## 2017-12-11 DIAGNOSIS — N183 Chronic kidney disease, stage 3 (moderate): Secondary | ICD-10-CM | POA: Diagnosis not present

## 2017-12-11 DIAGNOSIS — M1990 Unspecified osteoarthritis, unspecified site: Secondary | ICD-10-CM | POA: Diagnosis not present

## 2017-12-11 DIAGNOSIS — D62 Acute posthemorrhagic anemia: Secondary | ICD-10-CM | POA: Diagnosis not present

## 2017-12-11 DIAGNOSIS — Z4781 Encounter for orthopedic aftercare following surgical amputation: Secondary | ICD-10-CM | POA: Diagnosis not present

## 2017-12-11 DIAGNOSIS — E1151 Type 2 diabetes mellitus with diabetic peripheral angiopathy without gangrene: Secondary | ICD-10-CM | POA: Diagnosis not present

## 2017-12-11 DIAGNOSIS — E1122 Type 2 diabetes mellitus with diabetic chronic kidney disease: Secondary | ICD-10-CM | POA: Diagnosis not present

## 2017-12-12 ENCOUNTER — Encounter (INDEPENDENT_AMBULATORY_CARE_PROVIDER_SITE_OTHER): Payer: Self-pay | Admitting: Orthopedic Surgery

## 2017-12-12 ENCOUNTER — Ambulatory Visit (INDEPENDENT_AMBULATORY_CARE_PROVIDER_SITE_OTHER): Payer: Medicare Other | Admitting: Orthopedic Surgery

## 2017-12-12 DIAGNOSIS — Z89512 Acquired absence of left leg below knee: Secondary | ICD-10-CM

## 2017-12-12 NOTE — Progress Notes (Signed)
Office Visit Note   Patient: Kevin Bradford           Date of Birth: [birth year]           MRN: [id number] Visit Date: 12/12/2017              Requested by: Georgann Housekeeper, MD [address] [place], [place] [address] PCP: Georgann Housekeeper, MD  Chief Complaint  Patient presents with  . Left Leg - Follow-up, Routine Post Op      HPI: Patient presents 3 weeks status post left transtibial amputation he is wearing a 3 extra-large stump shrinker.  He has no complaints.  Assessment & Plan: Visit Diagnoses:  1. Acquired absence of left leg below knee [facility])     Plan: Start Dial soap cleansing where the 3 XL stump shrinker at all times follow-up with [facility] for a smaller shrinker anticipate prosthetic fitting in about a month.  Follow-Up Instructions: Return in about 3 weeks (around 01/02/2018).   Ortho Exam  Patient is alert, oriented, no adenopathy, well-dressed, normal affect, normal respiratory effort. Examination patient has excellent consolidation of the incision there is no ischemic changes there is no cellulitis no drainage no signs of infection there is minimal swelling.  We will harvest the staples today.  Follow-up with [facility] today.  Imaging: No results found. No images are attached to the encounter.  Labs: Lab Results  Component Value Date   HGBA1C 6.7 (H) 11/22/2017   HGBA1C 9.3 (H) 07/19/2017   HGBA1C 11.3 (H) 10/14/2014   ESRSEDRATE 119 (H) 10/18/2014   ESRSEDRATE 115 (H) 10/13/2014   CRP 8.5 (H) 10/18/2014   CRP 21.9 (H) 10/13/2014   LABURIC 3.3 (L) 10/14/2014   REPTSTATUS 07/24/2017 FINAL 07/19/2017   GRAMSTAIN  07/19/2017    FEW WBC PRESENT, PREDOMINANTLY MONONUCLEAR MODERATE GRAM POSITIVE COCCI IN PAIRS MODERATE GRAM NEGATIVE RODS    CULT  07/19/2017    MODERATE ENTEROCOCCUS FAECALIS FEW METHICILLIN RESISTANT STAPHYLOCOCCUS AUREUS MODERATE BACTEROIDES ORALIS BETA LACTAMASE POSITIVE    LABORGA METHICILLIN RESISTANT STAPHYLOCOCCUS AUREUS  07/19/2017   LABORGA ENTEROCOCCUS FAECALIS 07/19/2017     Lab Results  Component Value Date   ALBUMIN 3.5 11/27/2017   ALBUMIN 3.6 08/16/2017   ALBUMIN 2.4 (L) 10/14/2014   LABURIC 3.3 (L) 10/14/2014    There is no height or weight on file to calculate BMI.  Orders:  No orders of the defined types were placed in this encounter.  No orders of the defined types were placed in this encounter.    Procedures: No procedures performed  Clinical Data: No additional findings.  ROS:  All other systems negative, except as noted in the HPI. Review of Systems  Objective: Vital Signs: There were no vitals taken for this visit.  Specialty Comments:  No specialty comments available.  PMFS History: Patient Active Problem List   Diagnosis Date Noted  . Labile blood glucose   . Acute blood loss anemia   . Labile blood pressure   . Stage 3 chronic kidney disease (HCC)   . Type 2 diabetes mellitus with peripheral neuropathy (HCC)   . Post-operative pain   . Amputation of left lower extremity below knee upon examination (HCC) 11/24/2017  . Amputated below knee, left (HCC) 11/22/2017  . Osteomyelitis of left foot (HCC)   . Dehiscence of amputation stump (HCC) 08/11/2017  . S/P transmetatarsal amputation of foot, left (HCC) 07/19/2017  . Subacute osteomyelitis, left ankle and foot (HCC) 07/17/2017  . Diabetic  ulcer of left foot associated with type 2 diabetes mellitus (HCC)   . Diabetes mellitus with renal complications (HCC) 10/17/2014  . Diabetes (HCC) 10/13/2014  . Anemia 10/13/2014  . CKD (chronic kidney disease) stage 3, GFR 30-59 ml/min (HCC) 10/13/2014  . Hyperkalemia 10/13/2014   Past Medical History:  Diagnosis Date  . Arthritis    "joint pain" (08/16/2017)  . Cellulitis and abscess of leg 10/18/2014  . CKD (chronic kidney disease) stage 3, GFR 30-59 ml/min (HCC)    creatinine increases with antibiotics per pt, no known kidney disease per patient  . Dehiscence of  closure of skin, subsequent encounter   . Diabetic foot ulcer (HCC)    left  . Diabetic peripheral neuropathy (HCC)   . GERD (gastroesophageal reflux disease)   . Osteomyelitis (HCC)    left transmetatarsal   . Type II diabetes mellitus (HCC)     Family History  Problem Relation Age of Onset  . Dementia Mother     Past Surgical History:  Procedure Laterality Date  . AMPUTATION Left 07/19/2017   Procedure: LEFT TRANSMETATARSAL AMPUTATION;  Surgeon: Nadara Mustard, MD;  Location: [facility] OR;  Service: Orthopedics;  Laterality: Left;  . AMPUTATION Left 08/16/2017   REVISION LEFT TRANSMETATARSAL AMPUTATION  . AMPUTATION Left 11/22/2017   Procedure: LEFT BELOW KNEE AMPUTATION;  Surgeon: Nadara Mustard, MD;  Location: [facility] OR;  Service: Orthopedics;  Laterality: Left;  . CATARACT EXTRACTION W/ INTRAOCULAR LENS  IMPLANT, BILATERAL  ~ 2016  . I&D EXTREMITY Left 10/17/2014   Procedure: IRRIGATION AND DEBRIDEMENT EXTREMITY LEFT FOOT;  Surgeon: Nadara Mustard, MD;  Location: [facility] OR;  Service: Orthopedics;  Laterality: Left;  . STUMP REVISION Left 08/16/2017   Procedure: REVISION LEFT TRANSMETATARSAL AMPUTATION;  Surgeon: Nadara Mustard, MD;  Location: [facility] OR;  Service: Orthopedics;  Laterality: Left;  . TOE AMPUTATION Right 2013   5th toe   Social History   Occupational History  . Not on file  Tobacco Use  . Smoking status: Never Smoker  . Smokeless tobacco: Never Used  Substance and Sexual Activity  . Alcohol use: Yes    Comment: 11/21/2017 "maybe 1 beer 2 times a month"  . Drug use: No  . Sexual activity: Yes

## 2017-12-14 ENCOUNTER — Encounter: Payer: Medicare Other | Admitting: Physical Medicine & Rehabilitation

## 2017-12-15 DIAGNOSIS — E1151 Type 2 diabetes mellitus with diabetic peripheral angiopathy without gangrene: Secondary | ICD-10-CM | POA: Diagnosis not present

## 2017-12-15 DIAGNOSIS — N183 Chronic kidney disease, stage 3 (moderate): Secondary | ICD-10-CM | POA: Diagnosis not present

## 2017-12-15 DIAGNOSIS — E1122 Type 2 diabetes mellitus with diabetic chronic kidney disease: Secondary | ICD-10-CM | POA: Diagnosis not present

## 2017-12-15 DIAGNOSIS — Z4781 Encounter for orthopedic aftercare following surgical amputation: Secondary | ICD-10-CM | POA: Diagnosis not present

## 2017-12-15 DIAGNOSIS — D62 Acute posthemorrhagic anemia: Secondary | ICD-10-CM | POA: Diagnosis not present

## 2017-12-15 DIAGNOSIS — M1990 Unspecified osteoarthritis, unspecified site: Secondary | ICD-10-CM | POA: Diagnosis not present

## 2017-12-18 DIAGNOSIS — I739 Peripheral vascular disease, unspecified: Secondary | ICD-10-CM | POA: Diagnosis not present

## 2017-12-18 DIAGNOSIS — Z89512 Acquired absence of left leg below knee: Secondary | ICD-10-CM | POA: Diagnosis not present

## 2017-12-18 DIAGNOSIS — N183 Chronic kidney disease, stage 3 (moderate): Secondary | ICD-10-CM | POA: Diagnosis not present

## 2017-12-18 DIAGNOSIS — E1122 Type 2 diabetes mellitus with diabetic chronic kidney disease: Secondary | ICD-10-CM | POA: Diagnosis not present

## 2017-12-18 DIAGNOSIS — E1140 Type 2 diabetes mellitus with diabetic neuropathy, unspecified: Secondary | ICD-10-CM | POA: Diagnosis not present

## 2017-12-19 DIAGNOSIS — N183 Chronic kidney disease, stage 3 (moderate): Secondary | ICD-10-CM | POA: Diagnosis not present

## 2017-12-19 DIAGNOSIS — Z4781 Encounter for orthopedic aftercare following surgical amputation: Secondary | ICD-10-CM | POA: Diagnosis not present

## 2017-12-19 DIAGNOSIS — D62 Acute posthemorrhagic anemia: Secondary | ICD-10-CM | POA: Diagnosis not present

## 2017-12-19 DIAGNOSIS — E1151 Type 2 diabetes mellitus with diabetic peripheral angiopathy without gangrene: Secondary | ICD-10-CM | POA: Diagnosis not present

## 2017-12-19 DIAGNOSIS — E1122 Type 2 diabetes mellitus with diabetic chronic kidney disease: Secondary | ICD-10-CM | POA: Diagnosis not present

## 2017-12-19 DIAGNOSIS — M1990 Unspecified osteoarthritis, unspecified site: Secondary | ICD-10-CM | POA: Diagnosis not present

## 2017-12-20 ENCOUNTER — Encounter: Payer: Medicare Other | Attending: Physical Medicine & Rehabilitation | Admitting: Physical Medicine & Rehabilitation

## 2017-12-20 ENCOUNTER — Encounter: Payer: Self-pay | Admitting: Physical Medicine & Rehabilitation

## 2017-12-20 VITALS — BP 101/65 | HR 86

## 2017-12-20 DIAGNOSIS — Z89512 Acquired absence of left leg below knee: Secondary | ICD-10-CM | POA: Diagnosis not present

## 2017-12-20 DIAGNOSIS — Z9841 Cataract extraction status, right eye: Secondary | ICD-10-CM | POA: Insufficient documentation

## 2017-12-20 DIAGNOSIS — Z89421 Acquired absence of other right toe(s): Secondary | ICD-10-CM | POA: Insufficient documentation

## 2017-12-20 DIAGNOSIS — E1142 Type 2 diabetes mellitus with diabetic polyneuropathy: Secondary | ICD-10-CM | POA: Insufficient documentation

## 2017-12-20 DIAGNOSIS — R0989 Other specified symptoms and signs involving the circulatory and respiratory systems: Secondary | ICD-10-CM | POA: Diagnosis not present

## 2017-12-20 DIAGNOSIS — R269 Unspecified abnormalities of gait and mobility: Secondary | ICD-10-CM | POA: Insufficient documentation

## 2017-12-20 DIAGNOSIS — G8918 Other acute postprocedural pain: Secondary | ICD-10-CM | POA: Diagnosis not present

## 2017-12-20 DIAGNOSIS — K219 Gastro-esophageal reflux disease without esophagitis: Secondary | ICD-10-CM | POA: Insufficient documentation

## 2017-12-20 DIAGNOSIS — E1151 Type 2 diabetes mellitus with diabetic peripheral angiopathy without gangrene: Secondary | ICD-10-CM | POA: Insufficient documentation

## 2017-12-20 DIAGNOSIS — E1122 Type 2 diabetes mellitus with diabetic chronic kidney disease: Secondary | ICD-10-CM | POA: Diagnosis not present

## 2017-12-20 DIAGNOSIS — Z7409 Other reduced mobility: Secondary | ICD-10-CM | POA: Diagnosis not present

## 2017-12-20 DIAGNOSIS — Z9842 Cataract extraction status, left eye: Secondary | ICD-10-CM | POA: Diagnosis not present

## 2017-12-20 DIAGNOSIS — E11621 Type 2 diabetes mellitus with foot ulcer: Secondary | ICD-10-CM | POA: Insufficient documentation

## 2017-12-20 DIAGNOSIS — S88112A Complete traumatic amputation at level between knee and ankle, left lower leg, initial encounter: Secondary | ICD-10-CM | POA: Diagnosis not present

## 2017-12-20 DIAGNOSIS — N183 Chronic kidney disease, stage 3 (moderate): Secondary | ICD-10-CM | POA: Insufficient documentation

## 2017-12-20 NOTE — Progress Notes (Signed)
Subjective:    Patient ID: Kevin Bradford, male    DOB: June 10, 1946, 72 y.o.   MRN: [id number]  HPI 72 year old right-handed male, history of diabetes mellitus, CKD stage III, peripheral vascular disease with left transmetatarsal amputation in 07/19/2017 with revision 08/16/2017 presents for transitional care management after receiving CIR for left BKA.  DATE OF ADMISSION 11/24/2017 DATE OF DISCHARGE: 11/30/2017   At discharge, pt was instructed to follow with Ortho, which he is doing.  Staples were removed.  Pain is insignificant. CBGs have been controlled. Bowel movements are normal. BP is slightly low today. He saw his PCP.   Therapies: 2/week DME: Previously possessed Mobility: Wheelchair most of the time/walker at times  Pain Inventory Average Pain 1 Pain Right Now 0 My pain is intermittent and dull  In the last 24 hours, has pain interfered with the following? General activity 1 Relation with others 0 Enjoyment of life 1 What TIME of day is your pain at its worst? night Sleep (in general) Good  Pain is worse with: inactivity Pain improves with: rest and therapy/exercise Relief from Meds: 7  Mobility walk without assistance how many minutes can you walk? 0 ability to climb steps?  yes do you drive?  yes use a wheelchair transfers alone  Function not employed: date last employed 2018 disabled: date disabled 2019  Neuro/Psych No problems in this area  Prior Studies x-rays  Physicians involved in your care Primary care Dr. Eula Listen Orthopedist Dr. Leone Haven   Family History  Problem Relation Age of Onset  . Dementia Mother    Social History   Socioeconomic History  . Marital status: Married    Spouse name: Not on file  . Number of children: Not on file  . Years of education: Not on file  . Highest education level: Not on file  Occupational History  . Not on file  Social Needs  . Financial resource strain: Not on file  . Food insecurity:    Worry:  Not on file    Inability: Not on file  . Transportation needs:    Medical: Not on file    Non-medical: Not on file  Tobacco Use  . Smoking status: Never Smoker  . Smokeless tobacco: Never Used  Substance and Sexual Activity  . Alcohol use: Yes    Comment: 11/21/2017 "maybe 1 beer 2 times a month"  . Drug use: No  . Sexual activity: Yes  Lifestyle  . Physical activity:    Days per week: Not on file    Minutes per session: Not on file  . Stress: Not on file  Relationships  . Social connections:    Talks on phone: Not on file    Gets together: Not on file    Attends religious service: Not on file    Active member of club or organization: Not on file    Attends meetings of clubs or organizations: Not on file    Relationship status: Not on file  Other Topics Concern  . Not on file  Social History Narrative  . Not on file   Past Surgical History:  Procedure Laterality Date  . AMPUTATION Left 07/19/2017   Procedure: LEFT TRANSMETATARSAL AMPUTATION;  Surgeon: Nadara Mustard, MD;  Location: [facility] OR;  Service: Orthopedics;  Laterality: Left;  . AMPUTATION Left 08/16/2017   REVISION LEFT TRANSMETATARSAL AMPUTATION  . AMPUTATION Left 11/22/2017   Procedure: LEFT BELOW KNEE AMPUTATION;  Surgeon: Nadara Mustard, MD;  Location: [facility] OR;  Service:  Orthopedics;  Laterality: Left;  . CATARACT EXTRACTION W/ INTRAOCULAR LENS  IMPLANT, BILATERAL  ~ 2016  . I&D EXTREMITY Left 10/17/2014   Procedure: IRRIGATION AND DEBRIDEMENT EXTREMITY LEFT FOOT;  Surgeon: Nadara MustardMarcus Duda V, MD;  Location: [facility] OR;  Service: Orthopedics;  Laterality: Left;  . STUMP REVISION Left 08/16/2017   Procedure: REVISION LEFT TRANSMETATARSAL AMPUTATION;  Surgeon: Nadara Mustarduda, Marcus V, MD;  [facility];  Service: Orthopedics;  Laterality: Left;  . TOE AMPUTATION Right 2013   5th toe   Past Medical History:  Diagnosis Date  . Arthritis    "joint pain" (08/16/2017)  . Cellulitis and abscess of leg 10/18/2014  . CKD (chronic kidney disease)  stage 3, GFR 30-59 ml/min (HCC)    creatinine increases with antibiotics per pt, no known kidney disease per patient  . Dehiscence of closure of skin, subsequent encounter   . Diabetic foot ulcer (HCC)    left  . Diabetic peripheral neuropathy (HCC)   . GERD (gastroesophageal reflux disease)   . Osteomyelitis (HCC)    left transmetatarsal   . Type II diabetes mellitus (HCC)    BP 101/65   Pulse 86   SpO2 95%   Opioid Risk Score:   Fall Risk Score:  `1  Depression screen PHQ 2/9  No flowsheet data found. Review of Systems  Constitutional: Negative.   HENT: Negative.   Eyes: Negative.   Respiratory: Negative.   Cardiovascular: Negative.   Gastrointestinal: Negative.   Endocrine: Negative.   Genitourinary: Negative.   Musculoskeletal: Positive for gait problem.  Skin: Negative.   Allergic/Immunologic: Negative.   Hematological: Negative.   Psychiatric/Behavioral: Negative.   All other systems reviewed and are negative.     Objective:   Physical Exam Physical Exam:  Constitutional: No distress . Vital signs reviewed. HENT: Normocephalic.  Atraumatic. Eyes: EOMI. No discharge. Cardiovascular: RRR. No JVD. Respiratory: CTA Bilaterally. Normal effort. GI: BS +. Non-distended. Musc: No edema or tenderness in extremities. Musculoskeletal: LLE tenderness Neurological: He is alert.  Motor: B/l UE 5/5 prox to distal.  RLE 4+/5 prox to distal (stable).  LLE: HF 4+/5 (stable) Skin: BKA with staples and minimal serosanguineous drainage from medial incision Psychiatric: He has a normal mood and affect.     Assessment & Plan:  72 year old right-handed male, history of diabetes mellitus, CKD stage III, peripheral vascular disease with left transmetatarsal amputation in 07/19/2017 with revision 08/16/2017 presents for transitional care management after receiving CIR for left BKA.  1. Decreased functional mobility secondary to left BKA 11/22/2017.    Cont follow up with  Ortho  Cont therapies  2. Pain Management:   Controlled at present  3. Diabetes mellitus with peripheral neuropathy.    Cont meds  Controlled at present  4. Labile blood pressure  Relatively controlled at home her pt  5. Gait abnormality  Cont wheelchair/walker  Cont therapies  Meds reviewed Referrals reviewed All questions answered

## 2017-12-22 DIAGNOSIS — M1990 Unspecified osteoarthritis, unspecified site: Secondary | ICD-10-CM | POA: Diagnosis not present

## 2017-12-22 DIAGNOSIS — E1151 Type 2 diabetes mellitus with diabetic peripheral angiopathy without gangrene: Secondary | ICD-10-CM | POA: Diagnosis not present

## 2017-12-22 DIAGNOSIS — D62 Acute posthemorrhagic anemia: Secondary | ICD-10-CM | POA: Diagnosis not present

## 2017-12-22 DIAGNOSIS — Z4781 Encounter for orthopedic aftercare following surgical amputation: Secondary | ICD-10-CM | POA: Diagnosis not present

## 2017-12-22 DIAGNOSIS — N183 Chronic kidney disease, stage 3 (moderate): Secondary | ICD-10-CM | POA: Diagnosis not present

## 2017-12-22 DIAGNOSIS — E1122 Type 2 diabetes mellitus with diabetic chronic kidney disease: Secondary | ICD-10-CM | POA: Diagnosis not present

## 2017-12-26 ENCOUNTER — Encounter (INDEPENDENT_AMBULATORY_CARE_PROVIDER_SITE_OTHER): Payer: Self-pay | Admitting: Orthopedic Surgery

## 2017-12-26 ENCOUNTER — Ambulatory Visit (INDEPENDENT_AMBULATORY_CARE_PROVIDER_SITE_OTHER): Payer: Medicare Other | Admitting: Orthopedic Surgery

## 2017-12-26 ENCOUNTER — Other Ambulatory Visit (INDEPENDENT_AMBULATORY_CARE_PROVIDER_SITE_OTHER): Payer: Self-pay | Admitting: Orthopedic Surgery

## 2017-12-26 ENCOUNTER — Other Ambulatory Visit: Payer: Self-pay

## 2017-12-26 ENCOUNTER — Encounter (HOSPITAL_COMMUNITY): Payer: Self-pay | Admitting: *Deleted

## 2017-12-26 VITALS — Ht 74.0 in | Wt 216.0 lb

## 2017-12-26 DIAGNOSIS — T8781 Dehiscence of amputation stump: Secondary | ICD-10-CM

## 2017-12-26 NOTE — Progress Notes (Signed)
Office Visit Note   Patient: Kevin Bradford           Date of Birth: 07/01/1946           MRN: [id number] Visit Date: 12/26/2017              Requested by: Georgann Housekeeper, MD [address] [place], [place] [address] PCP: Georgann Housekeeper, MD  Chief Complaint  Patient presents with  . Left Leg - Routine Post Op    11/22/17 left BKA s/p direct impact fall today.       HPI: Patient is a 72 year old gentleman status post revision transtibial amputation.  Patient was ambulating with his limb guard protector he fell the limb guard protector broke and he had acute dehiscence of the transtibial amputation.  Assessment & Plan: Visit Diagnoses:  1. Dehiscence of amputation stump (HCC)     Plan: A dry dressing was applied a new limb protector was applied we will plan for urgent intervention for revision amputation tomorrow plan for several days hospitalization postoperatively and then discharged to home.  Follow-Up Instructions: Return in about 2 weeks (around 01/09/2018).   Ortho Exam  Patient is alert, oriented, no adenopathy, well-dressed, normal affect, normal respiratory effort. Examination patient has dehiscence of the surgical incision.  There is a draining hematoma there is no ischemic changes no cellulitis no signs of infection.  The residual limb is actually consolidating nicely.  Imaging: No results found. No images are attached to the encounter.  Labs: Lab Results  Component Value Date   HGBA1C 6.7 (H) 11/22/2017   HGBA1C 9.3 (H) 07/19/2017   HGBA1C 11.3 (H) 10/14/2014   ESRSEDRATE 119 (H) 10/18/2014   ESRSEDRATE 115 (H) 10/13/2014   CRP 8.5 (H) 10/18/2014   CRP 21.9 (H) 10/13/2014   LABURIC 3.3 (L) 10/14/2014   REPTSTATUS 07/24/2017 FINAL 07/19/2017   GRAMSTAIN  07/19/2017    FEW WBC PRESENT, PREDOMINANTLY MONONUCLEAR MODERATE GRAM POSITIVE COCCI IN PAIRS MODERATE GRAM NEGATIVE RODS    CULT  07/19/2017    MODERATE ENTEROCOCCUS FAECALIS FEW  METHICILLIN RESISTANT STAPHYLOCOCCUS AUREUS MODERATE BACTEROIDES ORALIS BETA LACTAMASE POSITIVE    LABORGA METHICILLIN RESISTANT STAPHYLOCOCCUS AUREUS 07/19/2017   LABORGA ENTEROCOCCUS FAECALIS 07/19/2017     Lab Results  Component Value Date   ALBUMIN 3.5 11/27/2017   ALBUMIN 3.6 08/16/2017   ALBUMIN 2.4 (L) 10/14/2014   LABURIC 3.3 (L) 10/14/2014    Body mass index is 27.73 kg/m.  Orders:  No orders of the defined types were placed in this encounter.  No orders of the defined types were placed in this encounter.    Procedures: No procedures performed  Clinical Data: No additional findings.  ROS:  All other systems negative, except as noted in the HPI. Review of Systems  Objective: Vital Signs: Ht 6\' 2"  (1.88 m)   Wt 216 lb (98 kg)   BMI 27.73 kg/m   Specialty Comments:  No specialty comments available.  PMFS History: Patient Active Problem List   Diagnosis Date Noted  . Labile blood glucose   . Acute blood loss anemia   . Labile blood pressure   . Stage 3 chronic kidney disease (HCC)   . Type 2 diabetes mellitus with peripheral neuropathy (HCC)   . Post-operative pain   . Amputation of left lower extremity below knee upon examination (HCC) 11/24/2017  . Amputated below knee, left (HCC) 11/22/2017  . Osteomyelitis of left foot (HCC)   . Dehiscence of amputation stump (  HCC) 08/11/2017  . S/P transmetatarsal amputation of foot, left (HCC) 07/19/2017  . Subacute osteomyelitis, left ankle and foot (HCC) 07/17/2017  . Diabetic ulcer of left foot associated with type 2 diabetes mellitus (HCC)   . Diabetes mellitus with renal complications (HCC) 10/17/2014  . Diabetes (HCC) 10/13/2014  . Anemia 10/13/2014  . CKD (chronic kidney disease) stage 3, GFR 30-59 ml/min (HCC) 10/13/2014  . Hyperkalemia 10/13/2014   Past Medical History:  Diagnosis Date  . Arthritis    "joint pain" (08/16/2017)  . Cellulitis and abscess of leg 10/18/2014  . CKD (chronic kidney  disease) stage 3, GFR 30-59 ml/min (HCC)    creatinine increases with antibiotics per pt, no known kidney disease per patient  . Dehiscence of closure of skin, subsequent encounter   . Diabetic foot ulcer (HCC)    left  . Diabetic peripheral neuropathy (HCC)   . GERD (gastroesophageal reflux disease)   . Osteomyelitis (HCC)    left transmetatarsal   . Type II diabetes mellitus (HCC)     Family History  Problem Relation Age of Onset  . Dementia Mother     Past Surgical History:  Procedure Laterality Date  . AMPUTATION Left 07/19/2017   Procedure: LEFT TRANSMETATARSAL AMPUTATION;  Surgeon: Nadara Mustarduda, Marcus V, MD;  [facility];  Service: Orthopedics;  Laterality: Left;  . AMPUTATION Left 08/16/2017   REVISION LEFT TRANSMETATARSAL AMPUTATION  . AMPUTATION Left 11/22/2017   Procedure: LEFT BELOW KNEE AMPUTATION;  Surgeon: Nadara Mustarduda, Marcus V, MD;  [facility];  Service: Orthopedics;  Laterality: Left;  . CATARACT EXTRACTION W/ INTRAOCULAR LENS  IMPLANT, BILATERAL  ~ 2016  . I&D EXTREMITY Left 10/17/2014   Procedure: IRRIGATION AND DEBRIDEMENT EXTREMITY LEFT FOOT;  Surgeon: Nadara MustardMarcus Duda V, MD;  Location: [facility] OR;  Service: Orthopedics;  Laterality: Left;  . STUMP REVISION Left 08/16/2017   Procedure: REVISION LEFT TRANSMETATARSAL AMPUTATION;  Surgeon: Nadara Mustarduda, Marcus V, MD;  [facility];  Service: Orthopedics;  Laterality: Left;  . TOE AMPUTATION Right 2013   5th toe   Social History   Occupational History  . Not on file  Tobacco Use  . Smoking status: Never Smoker  . Smokeless tobacco: Never Used  Substance and Sexual Activity  . Alcohol use: Yes    Comment: 11/21/2017 "maybe 1 beer 2 times a month"  . Drug use: No  . Sexual activity: Yes

## 2017-12-27 ENCOUNTER — Inpatient Hospital Stay (HOSPITAL_COMMUNITY): Payer: Medicare Other | Admitting: Certified Registered Nurse Anesthetist

## 2017-12-27 ENCOUNTER — Encounter (HOSPITAL_COMMUNITY)
Admission: RE | Disposition: A | Discharge status: Home Health | Payer: Self-pay | Source: Ambulatory Visit | Attending: Orthopedic Surgery

## 2017-12-27 ENCOUNTER — Inpatient Hospital Stay (HOSPITAL_COMMUNITY)
Admission: RE | Admit: 2017-12-27 | Discharge: 2017-12-29 | DRG: 494 | Disposition: A | Discharge status: Home Health | Payer: Medicare Other | Source: Ambulatory Visit | Attending: Orthopedic Surgery | Admitting: Orthopedic Surgery

## 2017-12-27 ENCOUNTER — Encounter (HOSPITAL_COMMUNITY): Payer: Self-pay | Admitting: General Practice

## 2017-12-27 DIAGNOSIS — K219 Gastro-esophageal reflux disease without esophagitis: Secondary | ICD-10-CM | POA: Diagnosis not present

## 2017-12-27 DIAGNOSIS — E1122 Type 2 diabetes mellitus with diabetic chronic kidney disease: Secondary | ICD-10-CM | POA: Diagnosis not present

## 2017-12-27 DIAGNOSIS — Z9841 Cataract extraction status, right eye: Secondary | ICD-10-CM | POA: Diagnosis not present

## 2017-12-27 DIAGNOSIS — Z961 Presence of intraocular lens: Secondary | ICD-10-CM | POA: Diagnosis not present

## 2017-12-27 DIAGNOSIS — Z794 Long term (current) use of insulin: Secondary | ICD-10-CM

## 2017-12-27 DIAGNOSIS — E875 Hyperkalemia: Secondary | ICD-10-CM | POA: Diagnosis not present

## 2017-12-27 DIAGNOSIS — T8781 Dehiscence of amputation stump: Principal | ICD-10-CM | POA: Diagnosis present

## 2017-12-27 DIAGNOSIS — Z6827 Body mass index (BMI) 27.0-27.9, adult: Secondary | ICD-10-CM

## 2017-12-27 DIAGNOSIS — Z820 Family history of epilepsy and other diseases of the nervous system: Secondary | ICD-10-CM | POA: Diagnosis not present

## 2017-12-27 DIAGNOSIS — Z885 Allergy status to narcotic agent status: Secondary | ICD-10-CM | POA: Diagnosis not present

## 2017-12-27 DIAGNOSIS — N183 Chronic kidney disease, stage 3 (moderate): Secondary | ICD-10-CM | POA: Diagnosis not present

## 2017-12-27 DIAGNOSIS — W010XXA Fall on same level from slipping, tripping and stumbling without subsequent striking against object, initial encounter: Secondary | ICD-10-CM | POA: Diagnosis present

## 2017-12-27 DIAGNOSIS — Z7982 Long term (current) use of aspirin: Secondary | ICD-10-CM | POA: Diagnosis not present

## 2017-12-27 DIAGNOSIS — Z9842 Cataract extraction status, left eye: Secondary | ICD-10-CM

## 2017-12-27 DIAGNOSIS — E669 Obesity, unspecified: Secondary | ICD-10-CM | POA: Diagnosis present

## 2017-12-27 DIAGNOSIS — E1142 Type 2 diabetes mellitus with diabetic polyneuropathy: Secondary | ICD-10-CM | POA: Diagnosis present

## 2017-12-27 HISTORY — PX: STUMP REVISION: SHX6102

## 2017-12-27 LAB — GLUCOSE, CAPILLARY
GLUCOSE-CAPILLARY: 145 mg/dL — AB (ref 65–99)
GLUCOSE-CAPILLARY: 281 mg/dL — AB (ref 65–99)
Glucose-Capillary: 139 mg/dL — ABNORMAL HIGH (ref 65–99)
Glucose-Capillary: 217 mg/dL — ABNORMAL HIGH (ref 65–99)

## 2017-12-27 LAB — BASIC METABOLIC PANEL
ANION GAP: 8 (ref 5–15)
BUN: 28 mg/dL — ABNORMAL HIGH (ref 6–20)
CALCIUM: 9.1 mg/dL (ref 8.9–10.3)
CHLORIDE: 109 mmol/L (ref 101–111)
CO2: 21 mmol/L — ABNORMAL LOW (ref 22–32)
Creatinine, Ser: 1.36 mg/dL — ABNORMAL HIGH (ref 0.61–1.24)
GFR calc non Af Amer: 50 mL/min — ABNORMAL LOW (ref >60)
GFR, EST AFRICAN AMERICAN: 58 mL/min — AB (ref >60)
Glucose, Bld: 157 mg/dL — ABNORMAL HIGH (ref 65–99)
Potassium: 4.5 mmol/L (ref 3.5–5.1)
SODIUM: 138 mmol/L (ref 135–145)

## 2017-12-27 LAB — CBC
HCT: 38.7 % — ABNORMAL LOW (ref 39.0–52.0)
Hemoglobin: 12.2 g/dL — ABNORMAL LOW (ref 13.0–17.0)
MCH: 29 pg (ref 26.0–34.0)
MCHC: 31.5 g/dL (ref 30.0–36.0)
MCV: 91.9 fL (ref 78.0–100.0)
Platelets: 160 10*3/uL (ref 150–400)
RBC: 4.21 MIL/uL — ABNORMAL LOW (ref 4.22–5.81)
RDW: 13.6 % (ref 11.5–15.5)
WBC: 5.9 10*3/uL (ref 4.0–10.5)

## 2017-12-27 LAB — SURGICAL PCR SCREEN
MRSA, PCR: NEGATIVE
STAPHYLOCOCCUS AUREUS: NEGATIVE

## 2017-12-27 SURGERY — REVISION, AMPUTATION SITE
Anesthesia: General | Laterality: Left

## 2017-12-27 MED ORDER — PROPOFOL 10 MG/ML IV BOLUS
INTRAVENOUS | Status: AC
  Filled 2017-12-27: qty 20

## 2017-12-27 MED ORDER — LACTATED RINGERS IV SOLN
INTRAVENOUS | Status: DC
  Administered 2017-12-27: 12:00:00 via INTRAVENOUS

## 2017-12-27 MED ORDER — ONDANSETRON HCL 4 MG/2ML IJ SOLN
INTRAMUSCULAR | Status: AC
  Filled 2017-12-27: qty 6

## 2017-12-27 MED ORDER — METHOCARBAMOL 500 MG PO TABS
500.0000 mg | ORAL_TABLET | Freq: Four times a day (QID) | ORAL | Status: DC | PRN
  Administered 2017-12-29 (×2): 500 mg via ORAL
  Filled 2017-12-27 (×2): qty 1

## 2017-12-27 MED ORDER — OXYCODONE HCL 5 MG/5ML PO SOLN: 5.0000 mg | Freq: Once | ORAL | Status: DC | PRN

## 2017-12-27 MED ORDER — LIDOCAINE HCL (CARDIAC) PF 100 MG/5ML IV SOSY
PREFILLED_SYRINGE | INTRAVENOUS | Status: DC | PRN
  Administered 2017-12-27: 100 mg via INTRAVENOUS

## 2017-12-27 MED ORDER — CEFAZOLIN SODIUM-DEXTROSE 2-4 GM/100ML-% IV SOLN
2.0000 g | Freq: Once | INTRAVENOUS | Status: AC
  Administered 2017-12-27: 2 g via INTRAVENOUS

## 2017-12-27 MED ORDER — FENTANYL CITRATE (PF) 250 MCG/5ML IJ SOLN
INTRAMUSCULAR | Status: AC
Start: 2017-12-27 — End: ?
  Filled 2017-12-27: qty 5

## 2017-12-27 MED ORDER — PENTOXIFYLLINE ER 400 MG PO TBCR
400.0000 mg | EXTENDED_RELEASE_TABLET | Freq: Three times a day (TID) | ORAL | Status: DC
  Administered 2017-12-28 – 2017-12-29 (×5): 400 mg via ORAL
  Filled 2017-12-27 (×7): qty 1

## 2017-12-27 MED ORDER — ONDANSETRON HCL 4 MG PO TABS: 4.0000 mg | ORAL_TABLET | Freq: Four times a day (QID) | ORAL | Status: DC | PRN

## 2017-12-27 MED ORDER — POLYETHYLENE GLYCOL 3350 17 G PO PACK: 17.0000 g | PACK | Freq: Every day | ORAL | Status: DC | PRN

## 2017-12-27 MED ORDER — HYDROCODONE-ACETAMINOPHEN 7.5-325 MG PO TABS
1.0000 | ORAL_TABLET | ORAL | Status: DC | PRN
  Administered 2017-12-27 – 2017-12-29 (×4): 2 via ORAL
  Filled 2017-12-27 (×4): qty 2

## 2017-12-27 MED ORDER — BISACODYL 10 MG RE SUPP: 10.0000 mg | Freq: Every day | RECTAL | Status: DC | PRN

## 2017-12-27 MED ORDER — CEFAZOLIN SODIUM-DEXTROSE 2-4 GM/100ML-% IV SOLN
INTRAVENOUS | Status: AC
  Filled 2017-12-27: qty 100

## 2017-12-27 MED ORDER — ONDANSETRON HCL 4 MG/2ML IJ SOLN: 4.0000 mg | Freq: Four times a day (QID) | INTRAMUSCULAR | Status: DC | PRN

## 2017-12-27 MED ORDER — PROPOFOL 10 MG/ML IV BOLUS
INTRAVENOUS | Status: DC | PRN
  Administered 2017-12-27: 150 mg via INTRAVENOUS

## 2017-12-27 MED ORDER — METOCLOPRAMIDE HCL 5 MG/ML IJ SOLN: 5.0000 mg | Freq: Three times a day (TID) | INTRAMUSCULAR | Status: DC | PRN

## 2017-12-27 MED ORDER — DEXAMETHASONE SODIUM PHOSPHATE 10 MG/ML IJ SOLN
INTRAMUSCULAR | Status: AC
  Filled 2017-12-27: qty 3

## 2017-12-27 MED ORDER — DEXAMETHASONE SODIUM PHOSPHATE 4 MG/ML IJ SOLN
INTRAMUSCULAR | Status: DC | PRN
  Administered 2017-12-27: 10 mg via INTRAVENOUS

## 2017-12-27 MED ORDER — METFORMIN HCL 500 MG PO TABS
500.0000 mg | ORAL_TABLET | Freq: Two times a day (BID) | ORAL | Status: DC
  Administered 2017-12-27 – 2017-12-29 (×4): 500 mg via ORAL
  Filled 2017-12-27 (×4): qty 1

## 2017-12-27 MED ORDER — INSULIN DETEMIR 100 UNIT/ML ~~LOC~~ SOLN
30.0000 [IU] | Freq: Every day | SUBCUTANEOUS | Status: DC
  Administered 2017-12-27 – 2017-12-28 (×2): 30 [IU] via SUBCUTANEOUS
  Filled 2017-12-27 (×3): qty 0.3

## 2017-12-27 MED ORDER — INSULIN ASPART 100 UNIT/ML ~~LOC~~ SOLN
0.0000 [IU] | Freq: Three times a day (TID) | SUBCUTANEOUS | Status: DC
  Administered 2017-12-27: 5 [IU] via SUBCUTANEOUS
  Administered 2017-12-28 (×2): 3 [IU] via SUBCUTANEOUS
  Administered 2017-12-29: 2 [IU] via SUBCUTANEOUS

## 2017-12-27 MED ORDER — SODIUM CHLORIDE 0.9 % IV SOLN
INTRAVENOUS | Status: DC
  Administered 2017-12-27: 17:00:00 via INTRAVENOUS

## 2017-12-27 MED ORDER — METOCLOPRAMIDE HCL 5 MG PO TABS: 5.0000 mg | ORAL_TABLET | Freq: Three times a day (TID) | ORAL | Status: DC | PRN

## 2017-12-27 MED ORDER — HYDROCODONE-ACETAMINOPHEN 5-325 MG PO TABS
1.0000 | ORAL_TABLET | ORAL | Status: DC | PRN
  Administered 2017-12-28 – 2017-12-29 (×3): 2 via ORAL
  Filled 2017-12-27 (×3): qty 2

## 2017-12-27 MED ORDER — ASPIRIN EC 325 MG PO TBEC
325.0000 mg | DELAYED_RELEASE_TABLET | Freq: Every day | ORAL | Status: DC
Start: 2017-12-28 — End: 2017-12-29
  Administered 2017-12-28 – 2017-12-29 (×2): 325 mg via ORAL
  Filled 2017-12-27 (×2): qty 1

## 2017-12-27 MED ORDER — DOCUSATE SODIUM 100 MG PO CAPS
100.0000 mg | ORAL_CAPSULE | Freq: Two times a day (BID) | ORAL | Status: DC
  Filled 2017-12-27 (×2): qty 1

## 2017-12-27 MED ORDER — CEFAZOLIN SODIUM-DEXTROSE 2-4 GM/100ML-% IV SOLN
2.0000 g | Freq: Four times a day (QID) | INTRAVENOUS | Status: AC
  Administered 2017-12-27 – 2017-12-28 (×3): 2 g via INTRAVENOUS
  Filled 2017-12-27 (×3): qty 100

## 2017-12-27 MED ORDER — HYDROMORPHONE HCL 2 MG/ML IJ SOLN
INTRAMUSCULAR | Status: AC
  Filled 2017-12-27: qty 1

## 2017-12-27 MED ORDER — METHOCARBAMOL 1000 MG/10ML IJ SOLN
500.0000 mg | Freq: Four times a day (QID) | INTRAVENOUS | Status: DC | PRN
  Filled 2017-12-27: qty 5

## 2017-12-27 MED ORDER — ONDANSETRON HCL 4 MG/2ML IJ SOLN
INTRAMUSCULAR | Status: DC | PRN
  Administered 2017-12-27: 4 mg via INTRAVENOUS

## 2017-12-27 MED ORDER — EPHEDRINE SULFATE 50 MG/ML IJ SOLN
INTRAMUSCULAR | Status: AC
  Filled 2017-12-27: qty 1

## 2017-12-27 MED ORDER — HYDROMORPHONE HCL 2 MG/ML IJ SOLN
0.2500 mg | INTRAMUSCULAR | Status: DC | PRN
  Administered 2017-12-27 (×3): 0.5 mg via INTRAVENOUS

## 2017-12-27 MED ORDER — MORPHINE SULFATE (PF) 2 MG/ML IV SOLN: 0.5000 mg | INTRAVENOUS | Status: DC | PRN

## 2017-12-27 MED ORDER — MAGNESIUM CITRATE PO SOLN: 1.0000 | Freq: Once | ORAL | Status: DC | PRN

## 2017-12-27 MED ORDER — LIDOCAINE 2% (20 MG/ML) 5 ML SYRINGE
INTRAMUSCULAR | Status: AC
  Filled 2017-12-27: qty 5

## 2017-12-27 MED ORDER — FENTANYL CITRATE (PF) 250 MCG/5ML IJ SOLN
INTRAMUSCULAR | Status: DC | PRN
  Administered 2017-12-27: 25 ug via INTRAVENOUS
  Administered 2017-12-27: 50 ug via INTRAVENOUS
  Administered 2017-12-27: 25 ug via INTRAVENOUS

## 2017-12-27 MED ORDER — INSULIN ASPART 100 UNIT/ML ~~LOC~~ SOLN
4.0000 [IU] | Freq: Three times a day (TID) | SUBCUTANEOUS | Status: DC
  Administered 2017-12-27 – 2017-12-29 (×6): 4 [IU] via SUBCUTANEOUS

## 2017-12-27 MED ORDER — OXYCODONE HCL 5 MG PO TABS: 5.0000 mg | ORAL_TABLET | Freq: Once | ORAL | Status: DC | PRN

## 2017-12-27 MED ORDER — ACETAMINOPHEN 325 MG PO TABS
325.0000 mg | ORAL_TABLET | Freq: Four times a day (QID) | ORAL | Status: DC | PRN
Start: 2017-12-28 — End: 2017-12-29

## 2017-12-27 SURGICAL SUPPLY — 33 items
BLADE SAW RECIP 87.9 MT (BLADE) IMPLANT
BLADE SURG 21 STRL SS (BLADE) ×3 IMPLANT
BNDG COHESIVE 6X5 TAN STRL LF (GAUZE/BANDAGES/DRESSINGS) ×3 IMPLANT
COVER SURGICAL LIGHT HANDLE (MISCELLANEOUS) ×3 IMPLANT
DRAPE EXTREMITY T 121X128X90 (DRAPE) ×3 IMPLANT
DRAPE HALF SHEET 40X57 (DRAPES) ×3 IMPLANT
DRAPE INCISE IOBAN 66X45 STRL (DRAPES) ×3 IMPLANT
DRAPE U-SHAPE 47X51 STRL (DRAPES) ×6 IMPLANT
DRESSING PREVENA PLUS CUSTOM (GAUZE/BANDAGES/DRESSINGS) ×1 IMPLANT
DRSG PREVENA PLUS CUSTOM (GAUZE/BANDAGES/DRESSINGS) ×3
DRSG VAC ATS MED SENSATRAC (GAUZE/BANDAGES/DRESSINGS) ×3 IMPLANT
DURAPREP 26ML APPLICATOR (WOUND CARE) ×3 IMPLANT
ELECT REM PT RETURN 9FT ADLT (ELECTROSURGICAL) ×3
ELECTRODE REM PT RTRN 9FT ADLT (ELECTROSURGICAL) ×1 IMPLANT
GLOVE BIOGEL PI IND STRL 9 (GLOVE) ×1 IMPLANT
GLOVE BIOGEL PI INDICATOR 9 (GLOVE) ×2
GLOVE SURG ORTHO 9.0 STRL STRW (GLOVE) ×3 IMPLANT
GOWN STRL REUS W/ TWL XL LVL3 (GOWN DISPOSABLE) ×2 IMPLANT
GOWN STRL REUS W/TWL XL LVL3 (GOWN DISPOSABLE) ×4
KIT BASIN OR (CUSTOM PROCEDURE TRAY) ×3 IMPLANT
KIT DRSG PREVENA PLUS 7DAY 125 (MISCELLANEOUS) ×3 IMPLANT
KIT TURNOVER KIT B (KITS) ×3 IMPLANT
MANIFOLD NEPTUNE II (INSTRUMENTS) ×3 IMPLANT
NS IRRIG 1000ML POUR BTL (IV SOLUTION) ×3 IMPLANT
PACK GENERAL/GYN (CUSTOM PROCEDURE TRAY) ×3 IMPLANT
PAD ARMBOARD 7.5X6 YLW CONV (MISCELLANEOUS) ×3 IMPLANT
PAD NEG PRESSURE SENSATRAC (MISCELLANEOUS) IMPLANT
STAPLER VISISTAT 35W (STAPLE) IMPLANT
SUT ETHILON 2 0 PSLX (SUTURE) ×6 IMPLANT
SUT SILK 2 0 (SUTURE)
SUT SILK 2-0 18XBRD TIE 12 (SUTURE) IMPLANT
TOWEL OR 17X26 10 PK STRL BLUE (TOWEL DISPOSABLE) ×3 IMPLANT
WND VAC CANISTER 500ML (MISCELLANEOUS) ×3 IMPLANT

## 2017-12-27 NOTE — Anesthesia Preprocedure Evaluation (Signed)
Anesthesia Evaluation  Patient identified by MRN, date of birth, ID band Patient awake    Reviewed: Allergy & Precautions, NPO status , Patient's Chart, lab work & pertinent test results  Airway Mallampati: II   Neck ROM: Full    Dental   Pulmonary neg pulmonary ROS,    breath sounds clear to auscultation       Cardiovascular  Rhythm:Regular Rate:Normal     Neuro/Psych  Neuromuscular disease    GI/Hepatic GERD  ,  Endo/Other  diabetesMorbid obesity  Renal/GU Renal InsufficiencyRenal disease     Musculoskeletal  (+) Arthritis ,   Abdominal   Peds  Hematology  (+) anemia ,   Anesthesia Other Findings   Reproductive/Obstetrics                             Anesthesia Physical Anesthesia Plan  ASA: III  Anesthesia Plan: General   Post-op Pain Management:    Induction: Intravenous  PONV Risk Score and Plan: Treatment may vary due to age or medical condition  Airway Management Planned: LMA  Additional Equipment:   Intra-op Plan:   Post-operative Plan: Extubation in OR  Informed Consent: I have reviewed the patients History and Physical, chart, labs and discussed the procedure including the risks, benefits and alternatives for the proposed anesthesia with the patient or authorized representative who has indicated his/her understanding and acceptance.   Dental advisory given  Plan Discussed with: CRNA  Anesthesia Plan Comments:         Anesthesia Quick Evaluation

## 2017-12-27 NOTE — Op Note (Signed)
12/27/2017  1:08 PM  PATIENT:  Kevin Bradford    PRE-OPERATIVE DIAGNOSIS:  dehiscence left below knee amputation  POST-OPERATIVE DIAGNOSIS:  Same  PROCEDURE:  LEFT BELOW KNEE AMPUTATION REVISION Application of incisional Praveena wound VAC  SURGEON:  Nadara MustardMarcus V Marqual Mi, MD  PHYSICIAN ASSISTANT: Hart CarwinJustin Queen ANESTHESIA:   General  PREOPERATIVE INDICATIONS:  Kevin Bradford is a  72 y.o. male with a diagnosis of dehiscence left below knee amputation who failed conservative measures and elected for surgical management.    The risks benefits and alternatives were discussed with the patient preoperatively including but not limited to the risks of infection, bleeding, nerve injury, cardiopulmonary complications, the need for revision surgery, among others, and the patient was willing to proceed.  OPERATIVE IMPLANTS: Praveena wound VAC  @ENCIMAGES @  OPERATIVE FINDINGS: Hematoma with ischemic muscle  OPERATIVE PROCEDURE: Patient was brought the operating room and underwent a general anesthetic.  After adequate levels of anesthesia were obtained patient's left lower extremity was prepped using DuraPrep draped into a sterile field a timeout was called.  A fishmouth incision was made around the wound dehiscence.  This was carried sharply down to bone and the distal centimeter of the tibia and fibula were resected with the block of tissue.  The tibia was beveled anteriorly.  Electrocautery was used for hemostasis.  The vascular bundles were suture-ligated with 2-0 silk.  All nonviable muscle was excised.  The wound was irrigated with normal saline.  The deep and superficial fascia layers and skin was closed using 2-0 nylon.  The wound VAC was applied this had a good suction fit patient was extubated taken to PACU in stable condition.   DISCHARGE PLANNING:  Antibiotic duration: 24 hours  Weightbearing: Nonweightbearing on the left  Pain medication: High-dose opiate pathway  Dressing care/ Wound VAC:  Wound VAC for 1 week  Ambulatory devices: Walker  Discharge to: Home  Follow-up: In the office 1 week post operative.

## 2017-12-27 NOTE — Anesthesia Procedure Notes (Signed)
Procedure Name: LMA Insertion Date/Time: 12/27/2017 12:28 PM Performed by: Margarita RanaHoltzman, Medina Degraffenreid Leffew, CRNA Pre-anesthesia Checklist: Patient identified, Emergency Drugs available, Suction available, Timeout performed and Patient being monitored Patient Re-evaluated:Patient Re-evaluated prior to induction Oxygen Delivery Method: Circle system utilized Preoxygenation: Pre-oxygenation with 100% oxygen Induction Type: IV induction Ventilation: Mask ventilation without difficulty LMA: LMA inserted LMA Size: 4.0 Number of attempts: 1 Tube secured with: Tape Dental Injury: Teeth and Oropharynx as per pre-operative assessment

## 2017-12-27 NOTE — Transfer of Care (Signed)
Immediate Anesthesia Transfer of Care Note  Patient: Kevin Bradford  Procedure(s) Performed: LEFT BELOW KNEE AMPUTATION REVISION (Left )  Patient Location: PACU  Anesthesia Type:General  Level of Consciousness: awake, alert , oriented, patient cooperative and responds to stimulation  Airway & Oxygen Therapy: Patient Spontanous Breathing and Patient connected to nasal cannula oxygen  Post-op Assessment: Report given to RN and Post -op Vital signs reviewed and stable  Post vital signs: Reviewed and stable  Last Vitals:  Vitals Value Taken Time  BP 106/82 12/27/2017  1:10 PM  Temp    Pulse 59 12/27/2017  1:11 PM  Resp 16 12/27/2017  1:11 PM  SpO2 100 % 12/27/2017  1:11 PM  Vitals shown include unvalidated device data.  Last Pain:  Vitals:   12/27/17 1132  PainSc: 2       Patients Stated Pain Goal: 2 (12/27/17 1132)  Complications: No apparent anesthesia complications

## 2017-12-27 NOTE — H&P (Signed)
Kevin Bradford is an 72 y.o. male.   Chief Complaint: Dehiscence left transtibial amputation. HPI: Patient is a [age] year old gentleman who lost his balance fell on the residual limb.  Patient was wearing the limb protector this broke and he had a traumatic dehiscence of the transtibial amputation.  Past Medical History:  Diagnosis Date  . Arthritis    "joint pain" (08/16/2017)  . Cellulitis and abscess of leg 10/18/2014  . CKD (chronic kidney disease) stage 3, GFR 30-59 ml/min (HCC)    creatinine increases with antibiotics per pt, no known kidney disease per patient  . Dehiscence of closure of skin, subsequent encounter   . Diabetic foot ulcer (HCC)    left  . Diabetic peripheral neuropathy (HCC)   . GERD (gastroesophageal reflux disease)    12/26/2017- "not this year."   . Osteomyelitis (HCC)    left transmetatarsal   . Type II diabetes mellitus (HCC)     Past Surgical History:  Procedure Laterality Date  . AMPUTATION Left 07/19/2017   Procedure: LEFT TRANSMETATARSAL AMPUTATION;  Surgeon: Nadara Mustarduda, Jamecia Lerman V, MD;  [facility];  Service: Orthopedics;  Laterality: Left;  . AMPUTATION Left 08/16/2017   REVISION LEFT TRANSMETATARSAL AMPUTATION  . AMPUTATION Left 11/22/2017   Procedure: LEFT BELOW KNEE AMPUTATION;  Surgeon: Nadara Mustarduda, Urijah Arko V, MD;  [facility];  Service: Orthopedics;  Laterality: Left;  . CATARACT EXTRACTION W/ INTRAOCULAR LENS  IMPLANT, BILATERAL  ~ 2016  . EYE SURGERY Bilateral    catarct  . I&D EXTREMITY Left 10/17/2014   Procedure: IRRIGATION AND DEBRIDEMENT EXTREMITY LEFT FOOT;  Surgeon: Nadara MustardMarcus Jency Schnieders V, MD;  Location: [facility] OR;  Service: Orthopedics;  Laterality: Left;  . STUMP REVISION Left 08/16/2017   Procedure: REVISION LEFT TRANSMETATARSAL AMPUTATION;  Surgeon: Nadara Mustarduda, Jillene Wehrenberg V, MD;  [facility];  Service: Orthopedics;  Laterality: Left;  . TOE AMPUTATION Right 2013   5th toe    Family History  Problem Relation Age of Onset  . Dementia Mother    Social History:   reports that he has never smoked. He has never used smokeless tobacco. He reports that he drinks alcohol. He reports that he does not use drugs.  Allergies:  Allergies  Allergen Reactions  . Oxycodone Shortness Of Breath    No medications prior to admission.    No results found for this or any previous visit (from the past 48 hour(s)). No results found.  Review of Systems  All other systems reviewed and are negative.   There were no vitals taken for this visit. Physical Exam  Patient is alert oriented no adenopathy well-dressed normal affect normal respiratory effort is ambulating in a wheelchair.  Examination he has a traumatic dehiscence of the entire surgical wound.  There is no cellulitis no ischemic changes no signs of infection. Assessment/Plan Assessment: Traumatic dehiscence left transtibial amputation.  Plan: We will plan for revision of the amputation.  Risk and benefits were discussed including risk of the wound not healing.  Patient states he understands wishes to proceed at this time.  Nadara MustardMarcus V Jacarra Bobak, MD 12/27/2017, 6:26 AM

## 2017-12-27 NOTE — Progress Notes (Signed)
Informed patient of his medications that he was receiving and he stated that his Levemir dosage was incorrect and is upset because his BS was elevated. Educated patient on reasons why his BS could be high and patient is adamant about receiving the dose he receives at home which is 40 units. Called Pharmacy and pharmacy stated to call the MD. LVM for on call MD to RTC due to patient's request of him needing the correct dose of his levemir at 40 units. Awaiting call from MD.

## 2017-12-28 ENCOUNTER — Encounter (HOSPITAL_COMMUNITY): Payer: Self-pay | Admitting: Orthopedic Surgery

## 2017-12-28 LAB — GLUCOSE, CAPILLARY
GLUCOSE-CAPILLARY: 232 mg/dL — AB (ref 65–99)
Glucose-Capillary: 207 mg/dL — ABNORMAL HIGH (ref 65–99)
Glucose-Capillary: 188 mg/dL — ABNORMAL HIGH (ref 65–99)
Glucose-Capillary: 120 mg/dL — ABNORMAL HIGH (ref 65–99)
Glucose-Capillary: 152 mg/dL — ABNORMAL HIGH (ref 65–99)
Glucose-Capillary: 113 mg/dL — ABNORMAL HIGH (ref 65–99)

## 2017-12-28 MED ORDER — INSULIN DETEMIR 100 UNIT/ML ~~LOC~~ SOLN
10.0000 [IU] | Freq: Once | SUBCUTANEOUS | Status: AC
  Administered 2017-12-28: 10 [IU] via SUBCUTANEOUS
  Filled 2017-12-28: qty 0.1

## 2017-12-28 NOTE — Progress Notes (Signed)
Patient ID: Kevin OlpGeorge Krienke, male   DOB: 29-Jan-1946, 72 y.o.   MRN: [id number] Postoperative day 1 revision transtibial amputation.  There is 50 cc in the wound VAC canister.  Plan for physical therapy progressive ambulation.  Discharge to home when safe with therapy.  Possible discharge on Friday.

## 2017-12-28 NOTE — Anesthesia Postprocedure Evaluation (Signed)
Anesthesia Post Note  Patient: Kevin OlpGeorge Bradford  Procedure(s) Performed: LEFT BELOW KNEE AMPUTATION REVISION (Left )     Patient location during evaluation: PACU Anesthesia Type: General Level of consciousness: awake and alert Pain management: pain level controlled Vital Signs Assessment: post-procedure vital signs reviewed and stable Respiratory status: spontaneous breathing, nonlabored ventilation, respiratory function stable and patient connected to nasal cannula oxygen Cardiovascular status: blood pressure returned to baseline and stable Postop Assessment: no apparent nausea or vomiting Anesthetic complications: no    Last Vitals:  Vitals:   12/28/17 0404 12/28/17 0850  BP: 119/83 102/72  Pulse: 100 94  Resp: 16 16  Temp: 36.9 C 36.9 C  SpO2: 98% 98%    Last Pain:  Vitals:   12/28/17 1019  TempSrc:   PainSc: 0-No pain                 Tillie Viverette,JAMES TERRILL

## 2017-12-28 NOTE — Evaluation (Signed)
Physical Therapy Evaluation Patient Details Name: Tommas OlpGeorge Hachey MRN: [id number] DOB: 09-03-1945 Today's Date: 12/28/2017   History of Present Illness  Pt is a 72 y/o male s/p L transtibial amputation revision after sustaining a fall at home. PMH including but not limited CKD, DM and L transtibial amputation on 11/22/17.    Clinical Impression  Pt presented supine in bed with HOB elevated, awake and willing to participate in therapy session. Prior to admission, pt reported that he was ambulating with use of RW and performing ADLs independently. Pt currently able to perform bed mobility with supervision and transfers with min guard and RW. Pt was very anxious throughout and very hesitant to participate in mobility. Pt stated that he will be primarily using his w/c at home and only using his RW the least amount possible. Pt declining further gait training this session. Pt would continue to benefit from skilled physical therapy services at this time while admitted and after d/c to address the below listed limitations in order to improve overall safety and independence with functional mobility.     Follow Up Recommendations Home health PT;Supervision/Assistance - 24 hour    Equipment Recommendations  None recommended by PT    Recommendations for Other Services       Precautions / Restrictions Precautions Precautions: Fall Restrictions Weight Bearing Restrictions: Yes LLE Weight Bearing: Non weight bearing      Mobility  Bed Mobility Overal bed mobility: Needs Assistance Bed Mobility: Supine to Sit     Supine to sit: Supervision;HOB elevated     General bed mobility comments: increased time; HOB maximally elevated  Transfers Overall transfer level: Needs assistance Equipment used: Rolling walker (2 wheeled) Transfers: Sit to/from UGI CorporationStand;Stand Pivot Transfers Sit to Stand: Min guard;+2 safety/equipment Stand pivot transfers: Min guard;+2 safety/equipment       General transfer  comment: increased time and effort, bed in elevated position, good technique  Ambulation/Gait             General Gait Details: pt deferring  Stairs            Wheelchair Mobility    Modified Rankin (Stroke Patients Only)       Balance Overall balance assessment: Needs assistance Sitting-balance support: No upper extremity supported Sitting balance-Leahy Scale: Good     Standing balance support: During functional activity;Bilateral upper extremity supported Standing balance-Leahy Scale: Poor                               Pertinent Vitals/Pain Pain Assessment: Faces Faces Pain Scale: Hurts little more Pain Location: L residual limb Pain Descriptors / Indicators: Sore;Guarding Pain Intervention(s): Monitored during session;Repositioned    Home Living Family/patient expects to be discharged to:: Private residence Living Arrangements: Spouse/significant other Available Help at Discharge: Family;Available 24 hours/day Type of Home: House Home Access: Level entry     Home Layout: Two level;1/2 bath on main level Home Equipment: Walker - 2 wheels;Wheelchair - manual      Prior Function Level of Independence: Independent with assistive device(s)         Comments: pt was ambulating with RW; pt was ascending/descending stairs in house to second floor in seated position     Hand Dominance        Extremity/Trunk Assessment   Upper Extremity Assessment Upper Extremity Assessment: Overall WFL for tasks assessed    Lower Extremity Assessment Lower Extremity Assessment: Overall WFL for tasks assessed;LLE deficits/detail LLE  Deficits / Details: wound VAC in place on residual limb    Cervical / Trunk Assessment Cervical / Trunk Assessment: Normal  Communication   Communication: No difficulties  Cognition Arousal/Alertness: Awake/alert Behavior During Therapy: Anxious Overall Cognitive Status: Impaired/Different from baseline Area of  Impairment: Attention;Problem solving                   Current Attention Level: Sustained         Problem Solving: Difficulty sequencing;Requires verbal cues;Requires tactile cues        General Comments      Exercises Other Exercises Other Exercises: PT discussed importance of flexing and extending L knee throughout the day to maintain normal ROM at knee for future prosthesis procurement   Assessment/Plan    PT Assessment Patient needs continued PT services  PT Problem List Decreased activity tolerance;Decreased balance;Decreased mobility;Decreased coordination;Decreased knowledge of use of DME;Decreased safety awareness;Decreased knowledge of precautions;Pain       PT Treatment Interventions DME instruction;Functional mobility training;Therapeutic activities;Gait training;Stair training;Therapeutic exercise;Balance training;Neuromuscular re-education;Patient/family education    PT Goals (Current goals can be found in the Care Plan section)  Acute Rehab PT Goals Patient Stated Goal: return home tomorrow PT Goal Formulation: With patient Time For Goal Achievement: 01/11/18 Potential to Achieve Goals: Good    Frequency Min 5X/week   Barriers to discharge        Co-evaluation               AM-PAC PT "6 Clicks" Daily Activity  Outcome Measure Difficulty turning over in bed (including adjusting bedclothes, sheets and blankets)?: None Difficulty moving from lying on back to sitting on the side of the bed? : None Difficulty sitting down on and standing up from a chair with arms (e.g., wheelchair, bedside commode, etc,.)?: Unable Help needed moving to and from a bed to chair (including a wheelchair)?: A Little Help needed walking in hospital room?: A Lot Help needed climbing 3-5 steps with a railing? : A Lot 6 Click Score: 16    End of Session Equipment Utilized During Treatment: Gait belt Activity Tolerance: Patient limited by pain Patient left: in  chair;with call bell/phone within reach Nurse Communication: Mobility status PT Visit Diagnosis: Other abnormalities of gait and mobility (R26.89);Pain Pain - Right/Left: Left Pain - part of body: Leg    Time: 1610-9604 PT Time Calculation (min) (ACUTE ONLY): 21 min   Charges:   PT Evaluation $PT Eval Moderate Complexity: 1 Mod     PT G Codes:        Waldo, PT, DPT [phone]   Alessandra Bevels Quana Chamberlain 12/28/2017, 2:05 PM

## 2017-12-29 LAB — GLUCOSE, CAPILLARY
GLUCOSE-CAPILLARY: 120 mg/dL — AB (ref 65–99)
Glucose-Capillary: 131 mg/dL — ABNORMAL HIGH (ref 65–99)

## 2017-12-29 MED ORDER — HYDROCODONE-ACETAMINOPHEN 5-325 MG PO TABS: 1.0000 | ORAL_TABLET | ORAL | 0 refills | Status: DC | PRN

## 2017-12-29 NOTE — Care Management Important Message (Signed)
Important Message  Patient Details  Name: Kevin Bradford MRN: [id number] Date of Birth: 02/19/1946   Medicare Important Message Given:  Yes    Saryna Kneeland Stefan ChurchBratton 12/29/2017, 1:14 PM

## 2017-12-29 NOTE — Progress Notes (Signed)
Physical Therapy Treatment Patient Details Name: Kevin Bradford MRN: [id number] DOB: 1946/01/08 Today's Date: 12/29/2017    History of Present Illness Pt is a 72 y/o male s/p L transtibial amputation revision after sustaining a fall at home. PMH including but not limited CKD, DM and L transtibial amputation on 11/22/17.    PT Comments    Pt making steady progress with functional mobility. Pt would continue to benefit from skilled physical therapy services at this time while admitted and after d/c to address the below listed limitations in order to improve overall safety and independence with functional mobility.    Follow Up Recommendations  Home health PT;Supervision/Assistance - 24 hour     Equipment Recommendations  None recommended by PT    Recommendations for Other Services       Precautions / Restrictions Precautions Precautions: Fall Restrictions Weight Bearing Restrictions: Yes LLE Weight Bearing: Non weight bearing    Mobility  Bed Mobility Overal bed mobility: Needs Assistance Bed Mobility: Supine to Sit     Supine to sit: Min guard     General bed mobility comments: HOB flat, increased time and effort, min guard for safety  Transfers Overall transfer level: Needs assistance Equipment used: Rolling walker (2 wheeled) Transfers: Sit to/from Stand Sit to Stand: From elevated surface;Min guard;+2 safety/equipment         General transfer comment: increased time and effort, bed in elevated position, good technique  Ambulation/Gait Ambulation/Gait assistance: Min guard Gait Distance (Feet): 25 Feet Assistive device: Rolling walker (2 wheeled) Gait Pattern/deviations: (hop-to on R LE) Gait velocity: decreased Gait velocity interpretation: <1.31 ft/sec, indicative of household ambulator General Gait Details: pt steady, cautious and slow with RW, min guard for safety; no LOB or need for physical assistance   Stairs             Wheelchair Mobility    Modified Rankin (Stroke Patients Only)       Balance Overall balance assessment: Needs assistance Sitting-balance support: No upper extremity supported Sitting balance-Leahy Scale: Good     Standing balance support: During functional activity;Bilateral upper extremity supported Standing balance-Leahy Scale: Poor                              Cognition Arousal/Alertness: Awake/alert Behavior During Therapy: Anxious Overall Cognitive Status: Impaired/Different from baseline Area of Impairment: Attention;Problem solving                   Current Attention Level: Sustained         Problem Solving: Difficulty sequencing;Requires verbal cues;Requires tactile cues        Exercises      General Comments        Pertinent Vitals/Pain Pain Assessment: Faces Faces Pain Scale: Hurts a little bit Pain Location: L residual limb Pain Descriptors / Indicators: Sore;Guarding Pain Intervention(s): Monitored during session;Repositioned    Home Living                      Prior Function            PT Goals (current goals can now be found in the care plan section) Acute Rehab PT Goals PT Goal Formulation: With patient Time For Goal Achievement: 01/11/18 Potential to Achieve Goals: Good Progress towards PT goals: Progressing toward goals    Frequency    Min 5X/week      PT Plan Current plan remains appropriate  Co-evaluation              AM-PAC PT "6 Clicks" Daily Activity  Outcome Measure  Difficulty turning over in bed (including adjusting bedclothes, sheets and blankets)?: None Difficulty moving from lying on back to sitting on the side of the bed? : None Difficulty sitting down on and standing up from a chair with arms (e.g., wheelchair, bedside commode, etc,.)?: Unable Help needed moving to and from a bed to chair (including a wheelchair)?: A Little Help needed walking in hospital room?: A Little Help needed climbing  3-5 steps with a railing? : A Lot 6 Click Score: 17    End of Session Equipment Utilized During Treatment: Gait belt Activity Tolerance: Patient limited by pain Patient left: in chair;with call bell/phone within reach Nurse Communication: Mobility status PT Visit Diagnosis: Other abnormalities of gait and mobility (R26.89);Pain Pain - Right/Left: Left Pain - part of body: Leg     Time: 1313-1330 PT Time Calculation (min) (ACUTE ONLY): 17 min  Charges:  $Gait Training: 8-22 mins                    G Codes:       Kevin Bradford, [place]PT, [place]PT [phone]    Kevin Bradford 12/29/2017, 5:15 PM

## 2017-12-29 NOTE — Progress Notes (Signed)
Discharge instructions completed with pt.  Pt verbalized understanding of the information.  Pt denies chest pain, shortness of breath, dizziness, lightheadedness, and n/v.  Pt's IV d/c'ed. Pt discharged home.  

## 2017-12-29 NOTE — Discharge Summary (Signed)
Discharge Diagnoses:  Active Problems:   Dehiscence of amputation stump (HCC)   Surgeries: Procedure(s): LEFT BELOW KNEE AMPUTATION REVISION on 12/27/2017    Consultants:   Discharged Condition: Improved  Hospital Course: Kevin Bradford is an 72 y.o. male who was admitted 12/27/2017 with a chief complaint of dehiscence amputation stump, with a final diagnosis of dehiscence left below knee amputation.  Patient was brought to the operating room on 12/27/2017 and underwent Procedure(s): LEFT BELOW KNEE AMPUTATION REVISION.    Patient was given perioperative antibiotics:  Anti-infectives (From admission, onward)   Start     Dose/Rate Route Frequency Ordered Stop   12/27/17 1800  ceFAZolin (ANCEF) IVPB 2g/100 mL premix     2 g 200 mL/hr over 30 Minutes Intravenous Every 6 hours 12/27/17 1321 12/28/17 0709   12/27/17 1324  ceFAZolin (ANCEF) 2-4 GM/100ML-% IVPB    Note to Pharmacy:  Lezlie LyeFrei, Lisa   : cabinet override      12/27/17 1324 12/27/17 1345   12/27/17 1215  ceFAZolin (ANCEF) IVPB 2g/100 mL premix     2 g 200 mL/hr over 30 Minutes Intravenous  Once 12/27/17 1208 12/27/17 1231   12/27/17 1210  ceFAZolin (ANCEF) 2-4 GM/100ML-% IVPB    Note to Pharmacy:  Larose HiresForte, Lindsi   : cabinet override      12/27/17 1210 12/27/17 1231    .  Patient was given sequential compression devices, early ambulation, and aspirin for DVT prophylaxis.  Recent vital signs:  Patient Vitals for the past 24 hrs:  BP Temp Temp src Pulse Resp SpO2  12/29/17 0410 127/67 98.3 F (36.8 C) Oral 83 16 97 %  12/28/17 2033 113/64 98.3 F (36.8 C) Oral 69 16 98 %  12/28/17 1409 107/69 97.7 F (36.5 C) Oral 97 16 99 %  12/28/17 0850 102/72 98.4 F (36.9 C) Oral 94 16 98 %  .  Recent laboratory studies: No results found.  Discharge Medications:   Allergies as of 12/29/2017      Reactions   Oxycodone Shortness Of Breath      Medication List    TAKE these medications   aspirin 325 MG EC tablet Take 1 tablet  (325 mg total) by mouth daily.   docusate sodium 100 MG capsule Commonly known as:  COLACE Take 1 capsule (100 mg total) by mouth 2 (two) times daily.   HYDROcodone-acetaminophen 5-325 MG tablet Commonly known as:  NORCO/VICODIN Take 1 tablet by mouth every 4 (four) hours as needed for moderate pain. What changed:    how much to take  reasons to take this   insulin aspart 100 UNIT/ML FlexPen Commonly known as:  NOVOLOG FLEXPEN Inject 4 Units into the skin 3 (three) times daily with meals. What changed:  how much to take   Insulin Detemir 100 UNIT/ML Pen Commonly known as:  LEVEMIR FLEXTOUCH Inject 25 Units into the skin daily at 10 pm. What changed:  how much to take   metFORMIN 500 MG tablet Commonly known as:  GLUCOPHAGE Take 1 tablet (500 mg total) by mouth 2 (two) times daily with a meal.   methocarbamol 500 MG tablet Commonly known as:  ROBAXIN Take 1 tablet (500 mg total) by mouth every 6 (six) hours as needed for muscle spasms.   pantoprazole 40 MG tablet Commonly known as:  PROTONIX Take 1 tablet (40 mg total) by mouth daily.   pentoxifylline 400 MG CR tablet Commonly known as:  TRENTAL Take 1 tablet (400 mg total) by  mouth 3 (three) times daily with meals.       Diagnostic Studies: No results found.  Patient benefited maximally from their hospital stay and there were no complications.     Disposition: Discharge disposition: 01-Home or Self Care      Discharge Instructions    Call MD / Call 911   Complete by:  As directed    If you experience chest pain or shortness of breath, CALL 911 and be transported to the hospital emergency room.  If you develope a fever above 101 F, pus (white drainage) or increased drainage or redness at the wound, or calf pain, call your surgeon's office.   Constipation Prevention   Complete by:  As directed    Drink plenty of fluids.  Prune juice may be helpful.  You may use a stool softener, such as Colace (over the  counter) 100 mg twice a day.  Use MiraLax (over the counter) for constipation as needed.   Diet - low sodium heart healthy   Complete by:  As directed    Increase activity slowly as tolerated   Complete by:  As directed    Negative Pressure Wound Therapy - Incisional   Complete by:  As directed    Give patient a new canister for use at home     Follow-up Information    Nadara Mustard, MD In 1 week.   Specialty:  Orthopedic Surgery Contact information: [address] [place] [address] [phone]            Signed: Nadara Mustard 12/29/2017, 7:05 AM

## 2018-01-01 DIAGNOSIS — E1122 Type 2 diabetes mellitus with diabetic chronic kidney disease: Secondary | ICD-10-CM | POA: Diagnosis not present

## 2018-01-01 DIAGNOSIS — E1151 Type 2 diabetes mellitus with diabetic peripheral angiopathy without gangrene: Secondary | ICD-10-CM | POA: Diagnosis not present

## 2018-01-01 DIAGNOSIS — Z4781 Encounter for orthopedic aftercare following surgical amputation: Secondary | ICD-10-CM | POA: Diagnosis not present

## 2018-01-01 DIAGNOSIS — M1990 Unspecified osteoarthritis, unspecified site: Secondary | ICD-10-CM | POA: Diagnosis not present

## 2018-01-01 DIAGNOSIS — D62 Acute posthemorrhagic anemia: Secondary | ICD-10-CM | POA: Diagnosis not present

## 2018-01-01 DIAGNOSIS — N183 Chronic kidney disease, stage 3 (moderate): Secondary | ICD-10-CM | POA: Diagnosis not present

## 2018-01-02 ENCOUNTER — Encounter (INDEPENDENT_AMBULATORY_CARE_PROVIDER_SITE_OTHER): Payer: Self-pay | Admitting: Orthopedic Surgery

## 2018-01-02 ENCOUNTER — Ambulatory Visit (INDEPENDENT_AMBULATORY_CARE_PROVIDER_SITE_OTHER): Payer: Medicare Other | Admitting: Orthopedic Surgery

## 2018-01-02 VITALS — Ht 74.0 in | Wt 216.0 lb

## 2018-01-02 DIAGNOSIS — Z89512 Acquired absence of left leg below knee: Secondary | ICD-10-CM

## 2018-01-02 DIAGNOSIS — T8781 Dehiscence of amputation stump: Secondary | ICD-10-CM

## 2018-01-02 NOTE — Progress Notes (Signed)
Office Visit Note   Patient: Kevin Bradford           Date of Birth: 05/10/1946           MRN: [id number] Visit Date: 01/02/2018              Requested by: Georgann Housekeeper, MD [address] [place], [place] [address] PCP: Georgann Housekeeper, MD  Chief Complaint  Patient presents with  . Left Leg - Routine Post Op    12/27/17 revision left BKA      HPI: Patient is a 72 year old gentleman who presents 1 week status post left transtibial amputation revision secondary to dehiscence from a fall.  Assessment & Plan: Visit Diagnoses:  1. Dehiscence of amputation stump (HCC)   2. Acquired absence of left leg below knee (HCC)     Plan: The wound VAC was removed the compression sock was applied 3 extra-large the stump protector was reapplied.  Follow-up for suture harvest in 2 weeks.  Follow-Up Instructions: Return in about 2 weeks (around 01/16/2018).   Ortho Exam  Patient is alert, oriented, no adenopathy, well-dressed, normal affect, normal respiratory effort. Examination the wound edges are well approximated there is no redness no cellulitis no drainage no signs of infection there is a little bit of dermatitis from the wound VAC sponge.  Imaging: No results found. No images are attached to the encounter.  Labs: Lab Results  Component Value Date   HGBA1C 6.7 (H) 11/22/2017   HGBA1C 9.3 (H) 07/19/2017   HGBA1C 11.3 (H) 10/14/2014   ESRSEDRATE 119 (H) 10/18/2014   ESRSEDRATE 115 (H) 10/13/2014   CRP 8.5 (H) 10/18/2014   CRP 21.9 (H) 10/13/2014   LABURIC 3.3 (L) 10/14/2014   REPTSTATUS 07/24/2017 FINAL 07/19/2017   GRAMSTAIN  07/19/2017    FEW WBC PRESENT, PREDOMINANTLY MONONUCLEAR MODERATE GRAM POSITIVE COCCI IN PAIRS MODERATE GRAM NEGATIVE RODS    CULT  07/19/2017    MODERATE ENTEROCOCCUS FAECALIS FEW METHICILLIN RESISTANT STAPHYLOCOCCUS AUREUS MODERATE BACTEROIDES ORALIS BETA LACTAMASE POSITIVE    LABORGA METHICILLIN RESISTANT STAPHYLOCOCCUS AUREUS  07/19/2017   LABORGA ENTEROCOCCUS FAECALIS 07/19/2017     Lab Results  Component Value Date   ALBUMIN 3.5 11/27/2017   ALBUMIN 3.6 08/16/2017   ALBUMIN 2.4 (L) 10/14/2014   LABURIC 3.3 (L) 10/14/2014    Body mass index is 27.73 kg/m.  Orders:  No orders of the defined types were placed in this encounter.  No orders of the defined types were placed in this encounter.    Procedures: No procedures performed  Clinical Data: No additional findings.  ROS:  All other systems negative, except as noted in the HPI. Review of Systems  Objective: Vital Signs: Ht 6\' 2"  (1.88 m)   Wt 216 lb (98 kg)   BMI 27.73 kg/m   Specialty Comments:  No specialty comments available.  PMFS History: Patient Active Problem List   Diagnosis Date Noted  . Labile blood glucose   . Acute blood loss anemia   . Labile blood pressure   . Stage 3 chronic kidney disease (HCC)   . Type 2 diabetes mellitus with peripheral neuropathy (HCC)   . Post-operative pain   . Amputation of left lower extremity below knee upon examination (HCC) 11/24/2017  . Amputated below knee, left (HCC) 11/22/2017  . Osteomyelitis of left foot (HCC)   . Dehiscence of amputation stump (HCC) 08/11/2017  . S/P transmetatarsal amputation of foot, left (HCC) 07/19/2017  . Subacute osteomyelitis, left  ankle and foot (HCC) 07/17/2017  . Diabetic ulcer of left foot associated with type 2 diabetes mellitus (HCC)   . Diabetes mellitus with renal complications (HCC) 10/17/2014  . Diabetes (HCC) 10/13/2014  . Anemia 10/13/2014  . CKD (chronic kidney disease) stage 3, GFR 30-59 ml/min (HCC) 10/13/2014  . Hyperkalemia 10/13/2014   Past Medical History:  Diagnosis Date  . Arthritis    "joint pain" (08/16/2017)  . Cellulitis and abscess of leg 10/18/2014  . CKD (chronic kidney disease) stage 3, GFR 30-59 ml/min (HCC)    creatinine increases with antibiotics per pt, no known kidney disease per patient  . Dehiscence of closure of  skin, subsequent encounter   . Diabetic foot ulcer (HCC)    left  . Diabetic peripheral neuropathy (HCC)   . GERD (gastroesophageal reflux disease)    12/26/2017- "not this year."   . Osteomyelitis (HCC)    left transmetatarsal   . Type II diabetes mellitus (HCC)     Family History  Problem Relation Age of Onset  . Dementia Mother     Past Surgical History:  Procedure Laterality Date  . AMPUTATION Left 07/19/2017   Procedure: LEFT TRANSMETATARSAL AMPUTATION;  Surgeon: Nadara Mustarduda, Makinsley Schiavi V, MD;  [facility];  Service: Orthopedics;  Laterality: Left;  . AMPUTATION Left 08/16/2017   REVISION LEFT TRANSMETATARSAL AMPUTATION  . AMPUTATION Left 11/22/2017   Procedure: LEFT BELOW KNEE AMPUTATION;  Surgeon: Nadara Mustarduda, Linnette Panella V, MD;  [facility];  Service: Orthopedics;  Laterality: Left;  . CATARACT EXTRACTION W/ INTRAOCULAR LENS  IMPLANT, BILATERAL  ~ 2016  . EYE SURGERY Bilateral    catarct  . I&D EXTREMITY Left 10/17/2014   Procedure: IRRIGATION AND DEBRIDEMENT EXTREMITY LEFT FOOT;  Surgeon: Nadara MustardMarcus Degan Hanser V, MD;  [facility] OR;  Service: Orthopedics;  Laterality: Left;  . STUMP REVISION Left 08/16/2017   Procedure: REVISION LEFT TRANSMETATARSAL AMPUTATION;  Surgeon: Nadara Mustarduda, Bobie Caris V, MD;  [facility];  Service: Orthopedics;  Laterality: Left;  . STUMP REVISION Left 12/27/2017   Procedure: LEFT BELOW KNEE AMPUTATION REVISION;  Surgeon: Nadara Mustarduda, Jewell Ryans V, MD;  [facility];  Service: Orthopedics;  Laterality: Left;  . TOE AMPUTATION Right 2013   5th toe   Social History   Occupational History  . Not on file  Tobacco Use  . Smoking status: Never Smoker  . Smokeless tobacco: Never Used  Substance and Sexual Activity  . Alcohol use: Yes    Comment:  "maybe 1 beer 2 times a month"-12/26/2017  . Drug use: No  . Sexual activity: Yes

## 2018-01-04 ENCOUNTER — Telehealth (INDEPENDENT_AMBULATORY_CARE_PROVIDER_SITE_OTHER): Payer: Self-pay | Admitting: Orthopedic Surgery

## 2018-01-04 NOTE — Telephone Encounter (Signed)
Tomma LightningFrankie from [facility] called wanting to ask for a continence of PT for 2 week 3 and 1 week 2. CB # [phone]. Can leave voicemail

## 2018-01-04 NOTE — Telephone Encounter (Signed)
Called and advised that continued PT for this pt ok

## 2018-01-05 DIAGNOSIS — M1990 Unspecified osteoarthritis, unspecified site: Secondary | ICD-10-CM | POA: Diagnosis not present

## 2018-01-05 DIAGNOSIS — E1122 Type 2 diabetes mellitus with diabetic chronic kidney disease: Secondary | ICD-10-CM | POA: Diagnosis not present

## 2018-01-05 DIAGNOSIS — N183 Chronic kidney disease, stage 3 (moderate): Secondary | ICD-10-CM | POA: Diagnosis not present

## 2018-01-05 DIAGNOSIS — E1151 Type 2 diabetes mellitus with diabetic peripheral angiopathy without gangrene: Secondary | ICD-10-CM | POA: Diagnosis not present

## 2018-01-05 DIAGNOSIS — Z4781 Encounter for orthopedic aftercare following surgical amputation: Secondary | ICD-10-CM | POA: Diagnosis not present

## 2018-01-05 DIAGNOSIS — D62 Acute posthemorrhagic anemia: Secondary | ICD-10-CM | POA: Diagnosis not present

## 2018-01-08 ENCOUNTER — Other Ambulatory Visit (INDEPENDENT_AMBULATORY_CARE_PROVIDER_SITE_OTHER): Payer: Self-pay | Admitting: Orthopedic Surgery

## 2018-01-08 DIAGNOSIS — D62 Acute posthemorrhagic anemia: Secondary | ICD-10-CM | POA: Diagnosis not present

## 2018-01-08 DIAGNOSIS — E1151 Type 2 diabetes mellitus with diabetic peripheral angiopathy without gangrene: Secondary | ICD-10-CM | POA: Diagnosis not present

## 2018-01-08 DIAGNOSIS — M1990 Unspecified osteoarthritis, unspecified site: Secondary | ICD-10-CM | POA: Diagnosis not present

## 2018-01-08 DIAGNOSIS — Z4781 Encounter for orthopedic aftercare following surgical amputation: Secondary | ICD-10-CM | POA: Diagnosis not present

## 2018-01-08 DIAGNOSIS — N183 Chronic kidney disease, stage 3 (moderate): Secondary | ICD-10-CM | POA: Diagnosis not present

## 2018-01-08 DIAGNOSIS — E1122 Type 2 diabetes mellitus with diabetic chronic kidney disease: Secondary | ICD-10-CM | POA: Diagnosis not present

## 2018-01-09 NOTE — Telephone Encounter (Signed)
Please advise 

## 2018-01-12 ENCOUNTER — Telehealth (INDEPENDENT_AMBULATORY_CARE_PROVIDER_SITE_OTHER): Payer: Self-pay | Admitting: Orthopedic Surgery

## 2018-01-12 DIAGNOSIS — E1151 Type 2 diabetes mellitus with diabetic peripheral angiopathy without gangrene: Secondary | ICD-10-CM | POA: Diagnosis not present

## 2018-01-12 DIAGNOSIS — M1990 Unspecified osteoarthritis, unspecified site: Secondary | ICD-10-CM | POA: Diagnosis not present

## 2018-01-12 DIAGNOSIS — Z4781 Encounter for orthopedic aftercare following surgical amputation: Secondary | ICD-10-CM | POA: Diagnosis not present

## 2018-01-12 DIAGNOSIS — E1122 Type 2 diabetes mellitus with diabetic chronic kidney disease: Secondary | ICD-10-CM | POA: Diagnosis not present

## 2018-01-12 DIAGNOSIS — N183 Chronic kidney disease, stage 3 (moderate): Secondary | ICD-10-CM | POA: Diagnosis not present

## 2018-01-12 DIAGNOSIS — D62 Acute posthemorrhagic anemia: Secondary | ICD-10-CM | POA: Diagnosis not present

## 2018-01-12 NOTE — Telephone Encounter (Signed)
[facility]  [phone] fax #   Check status of faxed forms   DOS   01/09/18   01/05/18

## 2018-01-15 DIAGNOSIS — Z4781 Encounter for orthopedic aftercare following surgical amputation: Secondary | ICD-10-CM | POA: Diagnosis not present

## 2018-01-15 DIAGNOSIS — N183 Chronic kidney disease, stage 3 (moderate): Secondary | ICD-10-CM | POA: Diagnosis not present

## 2018-01-15 DIAGNOSIS — M1990 Unspecified osteoarthritis, unspecified site: Secondary | ICD-10-CM | POA: Diagnosis not present

## 2018-01-15 DIAGNOSIS — D62 Acute posthemorrhagic anemia: Secondary | ICD-10-CM | POA: Diagnosis not present

## 2018-01-15 DIAGNOSIS — E1151 Type 2 diabetes mellitus with diabetic peripheral angiopathy without gangrene: Secondary | ICD-10-CM | POA: Diagnosis not present

## 2018-01-15 DIAGNOSIS — E1122 Type 2 diabetes mellitus with diabetic chronic kidney disease: Secondary | ICD-10-CM | POA: Diagnosis not present

## 2018-01-15 NOTE — Telephone Encounter (Signed)
In signature file for Dr. Lajoyce Cornersuda will fax as soon as they are signed.

## 2018-01-17 ENCOUNTER — Ambulatory Visit (INDEPENDENT_AMBULATORY_CARE_PROVIDER_SITE_OTHER): Payer: Medicare Other | Admitting: Family

## 2018-01-17 ENCOUNTER — Encounter (INDEPENDENT_AMBULATORY_CARE_PROVIDER_SITE_OTHER): Payer: Self-pay | Admitting: Family

## 2018-01-17 VITALS — Ht 74.0 in | Wt 216.0 lb

## 2018-01-17 DIAGNOSIS — S88112A Complete traumatic amputation at level between knee and ankle, left lower leg, initial encounter: Secondary | ICD-10-CM

## 2018-01-17 MED ORDER — HYDROCODONE-ACETAMINOPHEN 5-325 MG PO TABS: 1.0000 | ORAL_TABLET | Freq: Four times a day (QID) | ORAL | 0 refills | Status: DC | PRN

## 2018-01-17 NOTE — Progress Notes (Signed)
Post-Op Visit Note   Patient: Kevin Bradford           Date of Birth: [birth year]           MRN: [id number] Visit Date: 01/17/2018 PCP: Georgann Housekeeper, MD  Chief Complaint:  Chief Complaint  Patient presents with  . Left Leg - Routine Post Op    12/27/17 left BKA revision     HPI:  HPI  The patient is a 72 year old gentleman 3 weeks status post left below the knee amputation revision this is nearly healed there is gaping no drainage no erythema sutures to be harvested today  Ortho Exam   Visit Diagnoses: No diagnosis found.  Plan: Follow with [facility] set up.  We will follow-up in the office in 4 weeks.  Sooner should any concerns arise.  Follow-Up Instructions: No follow-ups on file.   Imaging: No results found.  Orders:  No orders of the defined types were placed in this encounter.  Meds ordered this encounter  Medications  . HYDROcodone-acetaminophen (NORCO/VICODIN) 5-325 MG tablet    Sig: Take 1 tablet by mouth every 6 (six) hours as needed for moderate pain.    Dispense:  28 tablet    Refill:  0     PMFS History: Patient Active Problem List   Diagnosis Date Noted  . Labile blood glucose   . Acute blood loss anemia   . Labile blood pressure   . Stage 3 chronic kidney disease (HCC)   . Type 2 diabetes mellitus with peripheral neuropathy (HCC)   . Post-operative pain   . Amputation of left lower extremity below knee upon examination (HCC) 11/24/2017  . Amputated below knee, left (HCC) 11/22/2017  . Osteomyelitis of left foot (HCC)   . Dehiscence of amputation stump (HCC) 08/11/2017  . S/P transmetatarsal amputation of foot, left (HCC) 07/19/2017  . Subacute osteomyelitis, left ankle and foot (HCC) 07/17/2017  . Diabetic ulcer of left foot associated with type 2 diabetes mellitus (HCC)   . Diabetes mellitus with renal complications (HCC) 10/17/2014  . Diabetes (HCC) 10/13/2014  . Anemia 10/13/2014  . CKD (chronic kidney disease) stage 3, GFR 30-59 ml/min  (HCC) 10/13/2014  . Hyperkalemia 10/13/2014   Past Medical History:  Diagnosis Date  . Arthritis    "joint pain" (08/16/2017)  . Cellulitis and abscess of leg 10/18/2014  . CKD (chronic kidney disease) stage 3, GFR 30-59 ml/min (HCC)    creatinine increases with antibiotics per pt, no known kidney disease per patient  . Dehiscence of closure of skin, subsequent encounter   . Diabetic foot ulcer (HCC)    left  . Diabetic peripheral neuropathy (HCC)   . GERD (gastroesophageal reflux disease)    12/26/2017- "not this year."   . Osteomyelitis (HCC)    left transmetatarsal   . Type II diabetes mellitus (HCC)     Family History  Problem Relation Age of Onset  . Dementia Mother     Past Surgical History:  Procedure Laterality Date  . AMPUTATION Left 07/19/2017   Procedure: LEFT TRANSMETATARSAL AMPUTATION;  Surgeon: Nadara Mustard, MD;  Location: [facility] OR;  Service: Orthopedics;  Laterality: Left;  . AMPUTATION Left 08/16/2017   REVISION LEFT TRANSMETATARSAL AMPUTATION  . AMPUTATION Left 11/22/2017   Procedure: LEFT BELOW KNEE AMPUTATION;  Surgeon: Nadara Mustard, MD;  Location: [facility] OR;  Service: Orthopedics;  Laterality: Left;  . CATARACT EXTRACTION W/ INTRAOCULAR LENS  IMPLANT, BILATERAL  ~ 2016  . EYE SURGERY Bilateral  catarct  . I&D EXTREMITY Left 10/17/2014   Procedure: IRRIGATION AND DEBRIDEMENT EXTREMITY LEFT FOOT;  Surgeon: Nadara MustardMarcus Duda V, MD;  [facility]: [facility] OR;  Service: Orthopedics;  Laterality: Left;  . STUMP REVISION Left 08/16/2017   Procedure: REVISION LEFT TRANSMETATARSAL AMPUTATION;  Surgeon: Nadara Mustarduda, Marcus V, MD;  [facility];  Service: Orthopedics;  Laterality: Left;  . STUMP REVISION Left 12/27/2017   Procedure: LEFT BELOW KNEE AMPUTATION REVISION;  Surgeon: Nadara Mustarduda, Marcus V, MD;  [facility];  Service: Orthopedics;  Laterality: Left;  . TOE AMPUTATION Right 2013   5th toe   Social History   Occupational History  . Not on file  Tobacco Use  . Smoking status: Never  Smoker  . Smokeless tobacco: Never Used  Substance and Sexual Activity  . Alcohol use: Yes    Comment:  "maybe 1 beer 2 times a month"-12/26/2017  . Drug use: No  . Sexual activity: Yes

## 2018-01-29 DIAGNOSIS — E1122 Type 2 diabetes mellitus with diabetic chronic kidney disease: Secondary | ICD-10-CM | POA: Diagnosis not present

## 2018-01-29 DIAGNOSIS — Z4781 Encounter for orthopedic aftercare following surgical amputation: Secondary | ICD-10-CM | POA: Diagnosis not present

## 2018-01-29 DIAGNOSIS — M1990 Unspecified osteoarthritis, unspecified site: Secondary | ICD-10-CM | POA: Diagnosis not present

## 2018-01-29 DIAGNOSIS — N183 Chronic kidney disease, stage 3 (moderate): Secondary | ICD-10-CM | POA: Diagnosis not present

## 2018-01-29 DIAGNOSIS — D62 Acute posthemorrhagic anemia: Secondary | ICD-10-CM | POA: Diagnosis not present

## 2018-01-29 DIAGNOSIS — E1151 Type 2 diabetes mellitus with diabetic peripheral angiopathy without gangrene: Secondary | ICD-10-CM | POA: Diagnosis not present

## 2018-02-08 ENCOUNTER — Telehealth (INDEPENDENT_AMBULATORY_CARE_PROVIDER_SITE_OTHER): Payer: Self-pay | Admitting: Orthopedic Surgery

## 2018-02-08 NOTE — Telephone Encounter (Signed)
Patient called stating that he needed Dr. Lajoyce Cornersuda to put in the orders for his prosthetic leg so that he can pick up the leg tomorrow.  CB#[phone].  Thank you.

## 2018-02-09 NOTE — Telephone Encounter (Signed)
Please advise 

## 2018-02-09 NOTE — Telephone Encounter (Signed)
rx written

## 2018-02-09 NOTE — Telephone Encounter (Signed)
Faxed Rx and patient information to [facility] at [phone].

## 2018-02-09 NOTE — Telephone Encounter (Signed)
Please fax

## 2018-02-19 ENCOUNTER — Other Ambulatory Visit (INDEPENDENT_AMBULATORY_CARE_PROVIDER_SITE_OTHER): Payer: Self-pay

## 2018-02-19 ENCOUNTER — Ambulatory Visit (INDEPENDENT_AMBULATORY_CARE_PROVIDER_SITE_OTHER): Payer: Medicare Other | Admitting: Orthopedic Surgery

## 2018-02-19 ENCOUNTER — Encounter (INDEPENDENT_AMBULATORY_CARE_PROVIDER_SITE_OTHER): Payer: Self-pay | Admitting: Orthopedic Surgery

## 2018-02-19 VITALS — Ht 74.0 in | Wt 216.0 lb

## 2018-02-19 DIAGNOSIS — Z89512 Acquired absence of left leg below knee: Secondary | ICD-10-CM

## 2018-02-19 DIAGNOSIS — S88112A Complete traumatic amputation at level between knee and ankle, left lower leg, initial encounter: Secondary | ICD-10-CM

## 2018-02-19 NOTE — Progress Notes (Signed)
Office Visit Note   Patient: Kevin Bradford           Date of Birth: Oct 07, 1945           MRN: [id number] Visit Date: 02/19/2018              Requested by: Georgann Housekeeper, MD [address] [place], [place] [address] PCP: Georgann Housekeeper, MD  Chief Complaint  Patient presents with  . Left Leg - Routine Post Op    12/27/17 left BKA revision       HPI: Patient is a 72 year old gentleman who presents 6 weeks status post left transtibial amputation.  He states he has had his prosthesis for 10 days.  Patient is ambulating with a walker.  Assessment & Plan: Visit Diagnoses:  1. Amputation of left lower extremity below knee upon examination [facility])     Plan: Patient will follow-up with [name] to coordinate his gait training.  Follow-Up Instructions: Return in about 3 months (around 05/22/2018).   Ortho Exam  Patient is alert, oriented, no adenopathy, well-dressed, normal affect, normal respiratory effort. Examination the residual limb is well healed well consolidated.  Imaging: No results found. No images are attached to the encounter.  Labs: Lab Results  Component Value Date   HGBA1C 6.7 (H) 11/22/2017   HGBA1C 9.3 (H) 07/19/2017   HGBA1C 11.3 (H) 10/14/2014   ESRSEDRATE 119 (H) 10/18/2014   ESRSEDRATE 115 (H) 10/13/2014   CRP 8.5 (H) 10/18/2014   CRP 21.9 (H) 10/13/2014   LABURIC 3.3 (L) 10/14/2014   REPTSTATUS 07/24/2017 FINAL 07/19/2017   GRAMSTAIN  07/19/2017    FEW WBC PRESENT, PREDOMINANTLY MONONUCLEAR MODERATE GRAM POSITIVE COCCI IN PAIRS MODERATE GRAM NEGATIVE RODS    CULT  07/19/2017    MODERATE ENTEROCOCCUS FAECALIS FEW METHICILLIN RESISTANT STAPHYLOCOCCUS AUREUS MODERATE BACTEROIDES ORALIS BETA LACTAMASE POSITIVE    LABORGA METHICILLIN RESISTANT STAPHYLOCOCCUS AUREUS 07/19/2017   LABORGA ENTEROCOCCUS FAECALIS 07/19/2017     Lab Results  Component Value Date   ALBUMIN 3.5 11/27/2017   ALBUMIN 3.6 08/16/2017   ALBUMIN 2.4 (L)  10/14/2014   LABURIC 3.3 (L) 10/14/2014    Body mass index is 27.73 kg/m.  Orders:  No orders of the defined types were placed in this encounter.  No orders of the defined types were placed in this encounter.    Procedures: No procedures performed  Clinical Data: No additional findings.  ROS:  All other systems negative, except as noted in the HPI. Review of Systems  Objective: Vital Signs: Ht 6\' 2"  (1.88 m)   Wt 216 lb (98 kg)   BMI 27.73 kg/m   Specialty Comments:  No specialty comments available.  PMFS History: Patient Active Problem List   Diagnosis Date Noted  . Labile blood glucose   . Acute blood loss anemia   . Labile blood pressure   . Stage 3 chronic kidney disease (HCC)   . Type 2 diabetes mellitus with peripheral neuropathy (HCC)   . Post-operative pain   . Amputation of left lower extremity below knee upon examination (HCC) 11/24/2017  . Amputated below knee, left (HCC) 11/22/2017  . Osteomyelitis of left foot (HCC)   . Dehiscence of amputation stump (HCC) 08/11/2017  . S/P transmetatarsal amputation of foot, left (HCC) 07/19/2017  . Subacute osteomyelitis, left ankle and foot (HCC) 07/17/2017  . Diabetic ulcer of left foot associated with type 2 diabetes mellitus (HCC)   . Diabetes mellitus with renal complications (HCC) 10/17/2014  .  Diabetes (HCC) 10/13/2014  . Anemia 10/13/2014  . CKD (chronic kidney disease) stage 3, GFR 30-59 ml/min (HCC) 10/13/2014  . Hyperkalemia 10/13/2014   Past Medical History:  Diagnosis Date  . Arthritis    "joint pain" (08/16/2017)  . Cellulitis and abscess of leg 10/18/2014  . CKD (chronic kidney disease) stage 3, GFR 30-59 ml/min (HCC)    creatinine increases with antibiotics per pt, no known kidney disease per patient  . Dehiscence of closure of skin, subsequent encounter   . Diabetic foot ulcer (HCC)    left  . Diabetic peripheral neuropathy (HCC)   . GERD (gastroesophageal reflux disease)    12/26/2017-  "not this year."   . Osteomyelitis (HCC)    left transmetatarsal   . Type II diabetes mellitus (HCC)     Family History  Problem Relation Age of Onset  . Dementia Mother     Past Surgical History:  Procedure Laterality Date  . AMPUTATION Left 07/19/2017   Procedure: LEFT TRANSMETATARSAL AMPUTATION;  Surgeon: Nadara Mustarduda, Delorean Knutzen V, MD;  [facility];  Service: Orthopedics;  Laterality: Left;  . AMPUTATION Left 08/16/2017   REVISION LEFT TRANSMETATARSAL AMPUTATION  . AMPUTATION Left 11/22/2017   Procedure: LEFT BELOW KNEE AMPUTATION;  Surgeon: Nadara Mustarduda, Meg Niemeier V, MD;  [facility];  Service: Orthopedics;  Laterality: Left;  . CATARACT EXTRACTION W/ INTRAOCULAR LENS  IMPLANT, BILATERAL  ~ 2016  . EYE SURGERY Bilateral    catarct  . I&D EXTREMITY Left 10/17/2014   Procedure: IRRIGATION AND DEBRIDEMENT EXTREMITY LEFT FOOT;  Surgeon: Nadara MustardMarcus Joni Norrod V, MD;  [facility];  Service: Orthopedics;  Laterality: Left;  . STUMP REVISION Left 08/16/2017   Procedure: REVISION LEFT TRANSMETATARSAL AMPUTATION;  Surgeon: Nadara Mustarduda, Letoya Stallone V, MD;  [facility];  Service: Orthopedics;  Laterality: Left;  . STUMP REVISION Left 12/27/2017   Procedure: LEFT BELOW KNEE AMPUTATION REVISION;  Surgeon: Nadara Mustarduda, Aedyn Mckeon V, MD;  [facility];  Service: Orthopedics;  Laterality: Left;  . TOE AMPUTATION Right 2013   5th toe   Social History   Occupational History  . Not on file  Tobacco Use  . Smoking status: Never Smoker  . Smokeless tobacco: Never Used  Substance and Sexual Activity  . Alcohol use: Yes    Comment:  "maybe 1 beer 2 times a month"-12/26/2017  . Drug use: No  . Sexual activity: Yes

## 2018-02-21 ENCOUNTER — Encounter: Payer: Medicare Other | Attending: Physical Medicine & Rehabilitation | Admitting: Physical Medicine & Rehabilitation

## 2018-02-21 ENCOUNTER — Encounter: Payer: Self-pay | Admitting: Physical Medicine & Rehabilitation

## 2018-02-21 ENCOUNTER — Other Ambulatory Visit: Payer: Self-pay

## 2018-02-21 VITALS — BP 125/76 | HR 88 | Ht 74.0 in | Wt 220.0 lb

## 2018-02-21 DIAGNOSIS — Z9841 Cataract extraction status, right eye: Secondary | ICD-10-CM | POA: Insufficient documentation

## 2018-02-21 DIAGNOSIS — E1142 Type 2 diabetes mellitus with diabetic polyneuropathy: Secondary | ICD-10-CM | POA: Diagnosis not present

## 2018-02-21 DIAGNOSIS — S88112A Complete traumatic amputation at level between knee and ankle, left lower leg, initial encounter: Secondary | ICD-10-CM | POA: Diagnosis not present

## 2018-02-21 DIAGNOSIS — E1151 Type 2 diabetes mellitus with diabetic peripheral angiopathy without gangrene: Secondary | ICD-10-CM | POA: Diagnosis not present

## 2018-02-21 DIAGNOSIS — Z7409 Other reduced mobility: Secondary | ICD-10-CM | POA: Insufficient documentation

## 2018-02-21 DIAGNOSIS — R0989 Other specified symptoms and signs involving the circulatory and respiratory systems: Secondary | ICD-10-CM | POA: Diagnosis not present

## 2018-02-21 DIAGNOSIS — G8918 Other acute postprocedural pain: Secondary | ICD-10-CM | POA: Diagnosis not present

## 2018-02-21 DIAGNOSIS — R269 Unspecified abnormalities of gait and mobility: Secondary | ICD-10-CM | POA: Diagnosis not present

## 2018-02-21 DIAGNOSIS — E1122 Type 2 diabetes mellitus with diabetic chronic kidney disease: Secondary | ICD-10-CM | POA: Diagnosis not present

## 2018-02-21 DIAGNOSIS — Z89421 Acquired absence of other right toe(s): Secondary | ICD-10-CM | POA: Diagnosis not present

## 2018-02-21 DIAGNOSIS — N183 Chronic kidney disease, stage 3 (moderate): Secondary | ICD-10-CM | POA: Insufficient documentation

## 2018-02-21 DIAGNOSIS — Z89512 Acquired absence of left leg below knee: Secondary | ICD-10-CM | POA: Diagnosis not present

## 2018-02-21 DIAGNOSIS — E11621 Type 2 diabetes mellitus with foot ulcer: Secondary | ICD-10-CM | POA: Diagnosis not present

## 2018-02-21 DIAGNOSIS — Z9842 Cataract extraction status, left eye: Secondary | ICD-10-CM | POA: Diagnosis not present

## 2018-02-21 DIAGNOSIS — K219 Gastro-esophageal reflux disease without esophagitis: Secondary | ICD-10-CM | POA: Diagnosis not present

## 2018-02-21 NOTE — Progress Notes (Signed)
Subjective:    Patient ID: Kevin Bradford, male    DOB: [birth year], 72 y.o.   MRN: [id number]  HPI 72 year old right-handed male, history of diabetes mellitus, CKD stage III, peripheral vascular presents for follow up for left BKA.  Last clinic visit 12/20/17.  Since last visit, pt had a fall and required a revision.  Pt states he is following with Dr. Lajoyce Cornersuda.  He has received his prosthesis.  Denies falls.  Using walker predominantly.     Pain Inventory Average Pain 0 Pain Right Now 0 My pain is intermittent and no pain  In the last 24 hours, has pain interfered with the following? General activity 0 Relation with others 0 Enjoyment of life 0 What TIME of day is your pain at its worst? no pain Sleep (in general) Fair  Pain is worse with: none Pain improves with: rest Relief from Meds: no pain  Mobility walk with assistance use a walker ability to climb steps?  yes do you drive?  yes use a wheelchair transfers alone  Function retired  Neuro/Psych No problems in this area  Prior Studies Any changes since last visit?  no  Physicians involved in your care Any changes since last visit?  no Orthopedist Dr. Leone Havenoda   Family History  Problem Relation Age of Onset  . Dementia Mother    Social History   Socioeconomic History  . Marital status: Married    Spouse name: Not on file  . Number of children: Not on file  . Years of education: Not on file  . Highest education level: Not on file  Occupational History  . Not on file  Social Needs  . Financial resource strain: Not on file  . Food insecurity:    Worry: Not on file    Inability: Not on file  . Transportation needs:    Medical: Not on file    Non-medical: Not on file  Tobacco Use  . Smoking status: Never Smoker  . Smokeless tobacco: Never Used  Substance and Sexual Activity  . Alcohol use: Yes    Comment:  "maybe 1 beer 2 times a month"-12/26/2017  . Drug use: No  . Sexual activity: Yes  Lifestyle  .  Physical activity:    Days per week: Not on file    Minutes per session: Not on file  . Stress: Not on file  Relationships  . Social connections:    Talks on phone: Not on file    Gets together: Not on file    Attends religious service: Not on file    Active member of club or organization: Not on file    Attends meetings of clubs or organizations: Not on file    Relationship status: Not on file  Other Topics Concern  . Not on file  Social History Narrative  . Not on file   Past Surgical History:  Procedure Laterality Date  . AMPUTATION Left 07/19/2017   Procedure: LEFT TRANSMETATARSAL AMPUTATION;  Surgeon: Nadara Mustarduda, Marcus V, MD;  [facility];  Service: Orthopedics;  Laterality: Left;  . AMPUTATION Left 08/16/2017   REVISION LEFT TRANSMETATARSAL AMPUTATION  . AMPUTATION Left 11/22/2017   Procedure: LEFT BELOW KNEE AMPUTATION;  Surgeon: Nadara Mustarduda, Marcus V, MD;  [facility];  Service: Orthopedics;  Laterality: Left;  . CATARACT EXTRACTION W/ INTRAOCULAR LENS  IMPLANT, BILATERAL  ~ 2016  . EYE SURGERY Bilateral    catarct  . I&D EXTREMITY Left 10/17/2014   Procedure: IRRIGATION AND DEBRIDEMENT EXTREMITY  LEFT FOOT;  Surgeon: Nadara Mustard, MD;  Location: [facility] OR;  Service: Orthopedics;  Laterality: Left;  . STUMP REVISION Left 08/16/2017   Procedure: REVISION LEFT TRANSMETATARSAL AMPUTATION;  Surgeon: Nadara Mustard, MD;  [facility];  Service: Orthopedics;  Laterality: Left;  . STUMP REVISION Left 12/27/2017   Procedure: LEFT BELOW KNEE AMPUTATION REVISION;  Surgeon: Nadara Mustard, MD;  Location: [facility] OR;  Service: Orthopedics;  Laterality: Left;  . TOE AMPUTATION Right 2013   5th toe   Past Medical History:  Diagnosis Date  . Arthritis    "joint pain" (08/16/2017)  . Cellulitis and abscess of leg 10/18/2014  . CKD (chronic kidney disease) stage 3, GFR 30-59 ml/min (HCC)    creatinine increases with antibiotics per pt, no known kidney disease per patient  . Dehiscence of closure of  skin, subsequent encounter   . Diabetic foot ulcer (HCC)    left  . Diabetic peripheral neuropathy (HCC)   . GERD (gastroesophageal reflux disease)    12/26/2017- "not this year."   . Osteomyelitis (HCC)    left transmetatarsal   . Type II diabetes mellitus (HCC)    BP 125/76   Pulse 88   Ht 6\' 2"  (1.88 m)   Wt 220 lb (99.8 kg)   SpO2 97%   BMI 28.25 kg/m   Opioid Risk Score:   Fall Risk Score:  `1  Depression screen PHQ 2/9  Depression screen PHQ 2/9 02/21/2018  Decreased Interest 0  Down, Depressed, Hopeless 0  PHQ - 2 Score 0   Review of Systems  Constitutional: Negative.   HENT: Negative.   Eyes: Negative.   Respiratory: Negative.   Cardiovascular: Negative.   Gastrointestinal: Negative.   Endocrine: Negative.   Genitourinary: Negative.   Musculoskeletal: Positive for gait problem.  Skin: Negative.   Allergic/Immunologic: Negative.   Hematological: Negative.   Psychiatric/Behavioral: Negative.   All other systems reviewed and are negative.     Objective:   Physical Exam Constitutional: No distress . Vital signs reviewed. HENT: Normocephalic.  Atraumatic. Eyes: EOMI. No discharge. Cardiovascular: RRR. No JVD. Respiratory: CTA bilaterally. Normal effort. GI: BS +. Non-distended. Musc: No edema or tenderness in extremities. Musculoskeletal: LLE tenderness Neurological: He is alert.  Motor: B/l UE 5/5 prox to distal.  RLE 5/5 prox to distal.  LLE: HF 5/5 (unchanged) Skin: BKA healing Psychiatric: He has a normal mood and affect.     Assessment & Plan:  72 year old right-handed male, history of diabetes mellitus, CKD stage III, peripheral vascular presents for follow up for left BKA.  1. Decreased functional mobility secondary to left BKA 11/22/2017.    Cont follow up with Ortho  Schedule to begin therapies  2. Pain Management:   Controlled at present  3. Gait abnormality  Cont wheelchair/walker  To being therapies

## 2018-03-01 ENCOUNTER — Other Ambulatory Visit: Payer: Self-pay

## 2018-03-01 ENCOUNTER — Encounter: Payer: Self-pay | Admitting: Physical Therapy

## 2018-03-01 ENCOUNTER — Ambulatory Visit: Payer: Medicare Other | Attending: Orthopedic Surgery | Admitting: Physical Therapy

## 2018-03-01 DIAGNOSIS — R2681 Unsteadiness on feet: Secondary | ICD-10-CM | POA: Diagnosis not present

## 2018-03-01 DIAGNOSIS — M6281 Muscle weakness (generalized): Secondary | ICD-10-CM | POA: Insufficient documentation

## 2018-03-01 DIAGNOSIS — R293 Abnormal posture: Secondary | ICD-10-CM | POA: Insufficient documentation

## 2018-03-01 DIAGNOSIS — R2689 Other abnormalities of gait and mobility: Secondary | ICD-10-CM | POA: Insufficient documentation

## 2018-03-01 DIAGNOSIS — Z9181 History of falling: Secondary | ICD-10-CM | POA: Diagnosis not present

## 2018-03-02 NOTE — Therapy (Signed)
[facility] [address] [place], [place], [address] Phone: [phone]   Fax:  [phone]  Physical Therapy Evaluation  Patient Details  Name: Kevin Bradford MRN: [id number] Date of Birth: 1946/06/02 Referring Provider: Meridee Score, MD   Encounter Date: 03/01/2018  PT End of Session - 03/01/18 1607    Visit Number  1    Number of Visits  25    Date for PT Re-Evaluation  05/30/18    Authorization Type  Medicare & AETNA Medicare 80% AETNA covers 20% once $300 deductible (at eval $91.17 met)    PT Start Time  1210    PT Stop Time  1315    PT Time Calculation (min)  65 min    Equipment Utilized During Treatment  Gait belt    Activity Tolerance  Patient tolerated treatment well    Behavior During Therapy  WFL for tasks assessed/performed       Past Medical History:  Diagnosis Date   Arthritis    "joint pain" (08/16/2017)   Cellulitis and abscess of leg 10/18/2014   CKD (chronic kidney disease) stage 3, GFR 30-59 ml/min (HCC)    creatinine increases with antibiotics per pt, no known kidney disease per patient   Dehiscence of closure of skin, subsequent encounter    Diabetic foot ulcer ([place])    left   Diabetic peripheral neuropathy ([place])    GERD (gastroesophageal reflux disease)    12/26/2017- "not this year."    Osteomyelitis ([place])    left transmetatarsal    Type II diabetes mellitus ([place])     Past Surgical History:  Procedure Laterality Date   AMPUTATION Left 07/19/2017   Procedure: LEFT TRANSMETATARSAL AMPUTATION;  Surgeon: Newt Minion, MD;  Location: [facility];  Service: Orthopedics;  Laterality: Left;   AMPUTATION Left 08/16/2017   REVISION LEFT TRANSMETATARSAL AMPUTATION   AMPUTATION Left 11/22/2017   Procedure: LEFT BELOW KNEE AMPUTATION;  Surgeon: Newt Minion, MD;  Location: [facility];  Service: Orthopedics;  Laterality: Left;   CATARACT EXTRACTION W/ INTRAOCULAR LENS  IMPLANT, BILATERAL  ~ 2016    EYE SURGERY Bilateral    catarct   I&D EXTREMITY Left 10/17/2014   Procedure: IRRIGATION AND DEBRIDEMENT EXTREMITY LEFT FOOT;  Surgeon: Newt Minion, MD;  Location: [facility];  Service: Orthopedics;  Laterality: Left;   STUMP REVISION Left 08/16/2017   Procedure: REVISION LEFT TRANSMETATARSAL AMPUTATION;  Surgeon: Newt Minion, MD;  Location: [facility];  Service: Orthopedics;  Laterality: Left;   STUMP REVISION Left 12/27/2017   Procedure: LEFT BELOW KNEE AMPUTATION REVISION;  Surgeon: Newt Minion, MD;  Location: [facility];  Service: Orthopedics;  Laterality: Left;   TOE AMPUTATION Right 2013   5th toe    There were no vitals filed for this visit.   Subjective Assessment - 03/01/18 1210    Subjective  This 72yo male was referred for PT by Meridee Score, MD on 02/19/2018. He underwent a left Transtibial Amputation on5/02/2018 with revision 12/27/2017. He had I&D of left foot 10/17/2014 and Left Transmetatarsal Amputation 07/19/2017 with revision 08/16/2017. He recieved his prosthesis 02/09/2018.      Pertinent History  L TTA, DM, CKD3, PVD, arthritis, peripheral neuropathy    Limitations  Lifting;Standing;Walking;House hold activities    Patient Stated Goals  To use prosthesis to be active fish, yard work, play with grandkids, ride bicycle. Swim in pool.     Currently in Pain?  No/denies  [facility] PT Assessment - 03/01/18 1215      Assessment   Medical Diagnosis  Left Transtibial Amputation    Referring Provider  Meridee Score, MD    Onset Date/Surgical Date  02/09/18   Prosthesis delivery   Hand Dominance  Right    Prior Therapy  HHPT pre-prosthetic      Precautions   Precautions  Fall      Restrictions   Weight Bearing Restrictions  No      Balance Screen   Has the patient fallen in the past 6 months  Yes    How many times?  1    Has the patient had a decrease in activity level because of a fear of falling?   No    Is the patient reluctant to leave their home because of a fear of  falling?   No      Home Social worker  Private residence    Living Arrangements  Spouse/significant other   2 cats,  He has 5 grandkids (5yo-10yo)   Type of [place] to enter    Entrance Stairs-Number of Steps  1-2    Entrance Stairs-Rails  None    Home Layout  Two level;1/2 bath on main level    Alternate Level Stairs-Number of Steps  15    Alternate Level Stairs-Rails  Left    Home Equipment  Walker - 2 wheels;Cane - single point;Shower seat;Tub bench;Walker - standard;Hand held shower head;Wheelchair - manual      Prior Function   Level of Independence  Independent;Independent with household mobility without device;Independent with community mobility without device    Vocation  Retired    [employer]  doing some plumbing floor transfers, climb ladders, lift 40-50#    Leisure  swim, bicycle, golf, travel      Posture/Postural Control   Posture/Postural Control  Postural limitations    Postural Limitations  Rounded Shoulders;Forward head;Flexed trunk;Weight shift right      ROM / Strength   AROM / PROM / Strength  AROM;Strength      AROM   Overall AROM   Within functional limits for tasks performed      Strength   Overall Strength  Deficits    Overall Strength Comments  gross functional testing indicates hipe extension & abduction LLE 3/5 & RLE 4/5      Transfers   Transfers  Sit to Stand;Stand to Sit    Sit to Stand  5: Supervision;With upper extremity assist;With armrests;From chair/3-in-1   uses back of legs against stable chair to stabilize   Stand to Sit  5: Supervision;With upper extremity assist;With armrests;To chair/3-in-1   uses back of legs against stable chair to stabilize     Ambulation/Gait   Ambulation/Gait  Yes    Ambulation/Gait Assistance  5: Supervision;4: Min assist   supervision RW & MinA cane   Ambulation/Gait Assistance Details  excessive UE weightbearing on RW & cane    Ambulation Distance  (Feet)  200 Feet   200' RW & 50' cane   Assistive device  Prosthesis;Rolling walker;Straight cane    Gait Pattern  Step-through pattern;Decreased arm swing - left;Decreased step length - right;Decreased stance time - left;Decreased stride length;Decreased hip/knee flexion - left;Decreased weight shift to left;Left circumduction;Left hip hike;Antalgic;Lateral hip instability;Trunk flexed;Abducted - left;Poor foot clearance - left    Ambulation Surface  Level;Indoor    Gait velocity  1.09 ft/sec RW &  1.17 ft/sec cane    Stairs  Yes    Stairs Assistance  5: Supervision    Stairs Assistance Details (indicate cue type and reason)  PT verbal cueing on modified technique with TTA prosthesis    Stair Management Technique  Two rails;Step to pattern;Forwards    Number of Stairs  4      Standardized Balance Assessment   Standardized Balance Assessment  Berg Balance Test;Dynamic Gait Index      Berg Balance Test   Sit to Stand  Able to stand  independently using hands    Standing Unsupported  Able to stand 2 minutes with supervision    Sitting with Back Unsupported but Feet Supported on Floor or Stool  Able to sit safely and securely 2 minutes    Stand to Sit  Uses backs of legs against chair to control descent    Transfers  Able to transfer safely, definite need of hands    Standing Unsupported with Eyes Closed  Able to stand 10 seconds with supervision    Standing Ubsupported with Feet Together  Needs help to attain position but able to stand for 30 seconds with feet together    From Standing, Reach Forward with Outstretched Arm  Reaches forward but needs supervision    From Standing Position, Pick up Object from Floor  Able to pick up shoe, needs supervision    From Standing Position, Turn to Look Behind Over each Shoulder  Looks behind one side only/other side shows less weight shift    Turn 360 Degrees  Needs assistance while turning    Standing Unsupported, Alternately Place Feet on Step/Stool   Needs assistance to keep from falling or unable to try    Standing Unsupported, One Foot in Front  Able to take small step independently and hold 30 seconds    Standing on One Leg  Tries to lift leg/unable to hold 3 seconds but remains standing independently    Total Score  29      Prosthetics Assessment - 03/01/18 1215      Prosthetics   Prosthetic Care Dependent with  Skin check;Residual limb care;Care of non-amputated limb;Prosthetic cleaning;Ply sock cleaning;Correct ply sock adjustment;Proper wear schedule/adjustment;Proper weight-bearing schedule/adjustment    Donning prosthesis   Supervision    Doffing prosthesis   Supervision    Current prosthetic wear tolerance (days/week)   daily 20 of 20 days since delivery    Current prosthetic wear tolerance (#hours/day)   reports most of awake hours last 10 days. Due to skin changes, PT recommended wearing prosthesis 3-4hrs, take 1 hr break wearing shrinker 3 times per day.     Current prosthetic weight-bearing tolerance (hours/day)   Pt tolerated 10 minutes of standing with partial weight on prosthesis with no complaints of pain or limb discomfort.     Edema  pitting edema with 3second refill    Residual limb condition   distal tibia red & shiny with 2 areas pre-blister state, 2 other wounds~24m opening on incision without drainage, minimal to no hair growth, normal moisture & temperature               Objective measurements completed on examination: See above findings.      [facility] - 03/01/18 1215      Prosthetics   Education Provided  Skin check;Residual limb care;Prosthetic cleaning;Correct ply sock adjustment;Proper Donning;Proper wear schedule/adjustment    Person(s) Educated  Patient;Spouse    Education Method  Explanation;Demonstration;Tactile cues;Verbal cues  Education Method  Verbalized understanding;Returned demonstration;Tactile cues required;Verbal cues required;Needs further instruction                PT Short Term Goals - 03/02/18 1811      PT SHORT TERM GOAL #1   Title  Patient verbalizes proper cleaning & residual limb care and demonstrates proper donning. (All STGs Target Date: 03/30/2018)    Time  4    Period  Weeks    Status  New    Target Date  03/30/18      PT SHORT TERM GOAL #2   Title  Patient tolerates wear of prosthesis >12 hrs total / day without skin issues.     Time  4    Period  Weeks    Status  New    Target Date  03/30/18      PT SHORT TERM GOAL #3   Title  Patient ambulates 500' with cane & prosthesis with supervision.     Time  4    Period  Weeks    Status  New    Target Date  03/30/18      PT SHORT TERM GOAL #4   Title  Patient negotiates ramps, curbs and stairs with 1 rail with cane & prosthesis with supervision.     Time  4    Period  Weeks    Status  New    Target Date  03/30/18      PT SHORT TERM GOAL #5   Title  Patient picks up 5# objects from floor and reaches 10" without balance loss with supervision.     Time  4    Period  Weeks    Status  New    Target Date  03/30/18        PT Long Term Goals - 03/01/18 1802      PT LONG TERM GOAL #1   Title  Patient demonstrates & verbalizes understanding of prosthetic care to enable safe use of prosthesis. (All LTGs Target Date: 05/25/2018)    Time  12    Period  Weeks    Status  New    Target Date  05/25/18      PT LONG TERM GOAL #2   Title  Patient tolerates wear of prosthesis >90% of awake hours without skin or limb pain issues to enable function throughout his day.     Time  12    Period  Weeks    Status  New    Target Date  05/25/18      PT LONG TERM GOAL #3   Title  Patient ambulates >1000' outdoors including grass, ramps & curbs with prosthesis only independently for community mobility.     Time  12    Period  Weeks    Status  New    Target Date  05/25/18      PT LONG TERM GOAL #4   Title  Berg Balance >45/56 to indicate lower fall risk.    Time  12     Period  Weeks    Status  New    Target Date  05/25/18      PT LONG TERM GOAL #5   Title  Patient perform floor transfers, lifts & carries 25# and push/pull to enable to return to yard work & other patient goals.     Time  12    Period  Weeks    Status  New    Target Date  05/25/18  Plan - 03/01/18 1757    Clinical Impression Statement  This 72yo male recieved his first prosthesis 20 days prior to PT evaluation & is dependent in use & care. He has worn prosthesis daily 20 of 20 days since delivery but increased wear too rapidly creating skin issues. He has wound on limb & skin on distal limb requires close skilled monitoring / instruction. Patient has reconditioning with weakness bilateral lower extremities and trunk impairing ADLs & abnormal posture. Berg Balance 29/56 indicates high fall risk. Patients gait with prosthesis with rolling walker is dependent up to 200 and gait deviations with high fall risk. Patient also ambulated with cane & prosthesis with minimal assistance. Patient would benefit from skilled PT instruction to improve function & safety with prosthesis.    History and Personal Factors relevant to plan of care:  L TTA, DM, CKD3, PVD, arthritis, peripheral neuropathy,    Clinical Presentation  Evolving    Clinical Presentation due to:  high fall risk with history of fall, weakness with deconditioning, dependency in prosthetic care with skin issues with residual limb, history of diabetes & neuropathy,     Clinical Decision Making  Moderate    Rehab Potential  Good    PT Frequency  2x / week    PT Duration  12 weeks    PT Treatment/Interventions  ADLs/Self Care Home Management;Gait training;Stair training;Functional mobility training;Therapeutic activities;Therapeutic exercise;DME Instruction;Balance training;Neuromuscular re-education;Patient/family education;Prosthetic Training;Vestibular;Manual techniques    PT Next Visit Plan  review prosthetic care,  instruct in HEP at sink, prosthetic gait with RW    Consulted and Agree with Plan of Care  Patient;Family member/caregiver    Family Member Consulted  wife, Adron Bene       Patient will benefit from skilled therapeutic intervention in order to improve the following deficits and impairments:  Abnormal gait, Decreased activity tolerance, Decreased balance, Decreased endurance, Decreased knowledge of use of DME, Decreased mobility, Decreased strength, Dizziness, Impaired flexibility, Postural dysfunction, Prosthetic Dependency  Visit Diagnosis: Abnormal posture  Muscle weakness (generalized)  Other abnormalities of gait and mobility  Unsteadiness on feet  History of falling     Problem List Patient Active Problem List   Diagnosis Date Noted   Labile blood glucose    Acute blood loss anemia    Labile blood pressure    Stage 3 chronic kidney disease (HCC)    Type 2 diabetes mellitus with peripheral neuropathy (HCC)    Post-operative pain    Amputation of left lower extremity below knee upon examination ([place]) 11/24/2017   Amputated below knee, left ([place]) 11/22/2017   Osteomyelitis of left foot ([place])    Dehiscence of amputation stump ([place]) 08/11/2017   S/P transmetatarsal amputation of foot, left ([place]) 07/19/2017   Subacute osteomyelitis, left ankle and foot ([place]) 07/17/2017   Diabetic ulcer of left foot associated with type 2 diabetes mellitus ([place])    Diabetes mellitus with renal complications ([place]) 70/35/0093   Diabetes ([place]) 10/13/2014   Anemia 10/13/2014   CKD (chronic kidney disease) stage 3, GFR 30-59 ml/min (HCC) 10/13/2014   Hyperkalemia 10/13/2014    Anberlyn Feimster PT, DPT 03/02/2018, 6:15 PM  [facility] [address] [place], [place], [address] Phone: [phone]   Fax:  [phone]  Name: Kevin Bradford MRN: [id number] Date of Birth: March 29, 1946

## 2018-03-05 ENCOUNTER — Encounter: Payer: Self-pay | Admitting: Physical Therapy

## 2018-03-05 ENCOUNTER — Ambulatory Visit: Payer: Medicare Other | Admitting: Physical Therapy

## 2018-03-05 DIAGNOSIS — R2681 Unsteadiness on feet: Secondary | ICD-10-CM

## 2018-03-05 DIAGNOSIS — R293 Abnormal posture: Secondary | ICD-10-CM | POA: Diagnosis not present

## 2018-03-05 DIAGNOSIS — Z9181 History of falling: Secondary | ICD-10-CM | POA: Diagnosis not present

## 2018-03-05 DIAGNOSIS — M6281 Muscle weakness (generalized): Secondary | ICD-10-CM | POA: Diagnosis not present

## 2018-03-05 DIAGNOSIS — R2689 Other abnormalities of gait and mobility: Secondary | ICD-10-CM | POA: Diagnosis not present

## 2018-03-05 NOTE — Patient Instructions (Signed)

## 2018-03-05 NOTE — Therapy (Signed)
[facility] [address] [place], [place], [address] Phone: [phone]   Fax:  [phone]  Physical Therapy Treatment  Patient Details  Name: Kevin Bradford MRN: [id number] Date of Birth: [birth year] Referring Provider: Meridee Score, MD   Encounter Date: 03/05/2018  PT End of Session - 03/05/18 0853    Visit Number  2    Number of Visits  25    Date for PT Re-Evaluation  05/30/18    Authorization Type  Medicare & AETNA Medicare 80% AETNA covers 20% once $300 deductible (at eval $91.17 met)    PT Start Time  0848    PT Stop Time  0930    PT Time Calculation (min)  42 min    Equipment Utilized During Treatment  Gait belt    Activity Tolerance  Patient tolerated treatment well;No increased pain    Behavior During Therapy  WFL for tasks assessed/performed       Past Medical History:  Diagnosis Date  . Arthritis    "joint pain" (08/16/2017)  . Cellulitis and abscess of leg 10/18/2014  . CKD (chronic kidney disease) stage 3, GFR 30-59 ml/min (HCC)    creatinine increases with antibiotics per pt, no known kidney disease per patient  . Dehiscence of closure of skin, subsequent encounter   . Diabetic foot ulcer ([place])    left  . Diabetic peripheral neuropathy ([place])   . GERD (gastroesophageal reflux disease)    12/26/2017- "not this year."   . Osteomyelitis ([place])    left transmetatarsal   . Type II diabetes mellitus ([place])     Past Surgical History:  Procedure Laterality Date  . AMPUTATION Left 07/19/2017   Procedure: LEFT TRANSMETATARSAL AMPUTATION;  Surgeon: Newt Minion, MD;  Location: [facility];  Service: Orthopedics;  Laterality: Left;  . AMPUTATION Left 08/16/2017   REVISION LEFT TRANSMETATARSAL AMPUTATION  . AMPUTATION Left 11/22/2017   Procedure: LEFT BELOW KNEE AMPUTATION;  Surgeon: Newt Minion, MD;  Location: [facility];  Service: Orthopedics;  Laterality: Left;  . CATARACT EXTRACTION W/ INTRAOCULAR LENS  IMPLANT,  BILATERAL  ~ 2016  . EYE SURGERY Bilateral    catarct  . I&D EXTREMITY Left 10/17/2014   Procedure: IRRIGATION AND DEBRIDEMENT EXTREMITY LEFT FOOT;  Surgeon: Newt Minion, MD;  Location: [facility];  Service: Orthopedics;  Laterality: Left;  . STUMP REVISION Left 08/16/2017   Procedure: REVISION LEFT TRANSMETATARSAL AMPUTATION;  Surgeon: Newt Minion, MD;  Location: [facility];  Service: Orthopedics;  Laterality: Left;  . STUMP REVISION Left 12/27/2017   Procedure: LEFT BELOW KNEE AMPUTATION REVISION;  Surgeon: Newt Minion, MD;  Location: [facility];  Service: Orthopedics;  Laterality: Left;  . TOE AMPUTATION Right 2013   5th toe    There were no vitals filed for this visit.  Subjective Assessment - 03/05/18 0852    Subjective  No new complaints. No falls. Reports he has been using the cane at times, "just trying it out".     Pertinent History  L TTA, DM, CKD3, PVD, arthritis, peripheral neuropathy    Limitations  Lifting;Standing;Walking;House hold activities    Patient Stated Goals  To use prosthesis to be active fish, yard work, play with grandkids, ride bicycle. Swim in pool.     Currently in Pain?  No/denies          [facility] Adult PT Treatment/Exercise - 03/05/18 0854      Transfers   Transfers  Sit to Stand;Stand to  Sit    Sit to Stand  5: Supervision;With upper extremity assist;With armrests;From chair/3-in-1    Stand to Sit  5: Supervision;With upper extremity assist;With armrests;To chair/3-in-1      Ambulation/Gait   Ambulation/Gait  Yes    Ambulation/Gait Assistance  5: Supervision;4: Min guard;4: Min assist    Ambulation Distance (Feet)  30 Feet   x1 with cane, 100 x3    Assistive device  Prosthesis;Rolling walker;Straight cane    Gait Pattern  Step-through pattern;Decreased arm swing - left;Decreased step length - right;Decreased stance time - left;Decreased stride length;Decreased hip/knee flexion - left;Decreased weight shift to left;Left circumduction;Left hip  hike;Antalgic;Lateral hip instability;Trunk flexed;Abducted - left;Poor foot clearance - left    Ambulation Surface  Level;Indoor    Stairs  Yes    Stairs Assistance  5: Supervision;4: Min guard;4: Min assist    Stairs Assistance Details (indicate cue type and reason)  pt needed supervision only with 1 rail and step to pattern; progressed to use of both rails with instruction on technique for reciprocal pattern. cues needed for weight shifting, hand advancement on rails and prosthetic foot placement for knee control with descending. improved with practice from min assist to min guard assist                              Stair Management Technique  One rail Right;Two rails;Step to pattern;Alternating pattern;Forwards    Number of Stairs  4   x 5-6 reps   Ramp  4: Min assist;Other (comment)   min guard assist   Ramp Details (indicate cue type and reason)  with RW/prosthesis x 3 reps, cues on posture, sequencing and technique. improved from min assist to min guard assist as reps progressed.     Curb  Other (comment)   min guard   Curb Details (indicate cue type and reason)  with RW/prosthesis, cues on sequencing and technique.       Neuro Re-ed    Neuro Re-ed Details   educated on and issued sink HEP for balance/proprioception. min guard assist for balance with cues on form/technique with exercises.       Prosthetics   Current prosthetic wear tolerance (days/week)   daily    Current prosthetic wear tolerance (#hours/day)   most awake hours, with a break midday.     Residual limb condition   small open area on distal, lateral side of incision. covered with Tegederm    Education Provided  Residual limb care;Correct ply sock adjustment;Proper wear schedule/adjustment;Proper weight-bearing schedule/adjustment   sink HEP   Person(s) Educated  Patient;Spouse    Education Method  Explanation;Demonstration;Verbal cues;Handout    Education Method  Verbalized understanding;Verbal cues required;Needs  further instruction         PT Short Term Goals - 03/02/18 1811      PT SHORT TERM GOAL #1   Title  Patient verbalizes proper cleaning & residual limb care and demonstrates proper donning. (All STGs Target Date: 03/30/2018)    Time  4    Period  Weeks    Status  New    Target Date  03/30/18      PT SHORT TERM GOAL #2   Title  Patient tolerates wear of prosthesis >12 hrs total / day without skin issues.     Time  4    Period  Weeks    Status  New    Target Date  03/30/18  PT SHORT TERM GOAL #3   Title  Patient ambulates 500' with cane & prosthesis with supervision.     Time  4    Period  Weeks    Status  New    Target Date  03/30/18      PT SHORT TERM GOAL #4   Title  Patient negotiates ramps, curbs and stairs with 1 rail with cane & prosthesis with supervision.     Time  4    Period  Weeks    Status  New    Target Date  03/30/18      PT SHORT TERM GOAL #5   Title  Patient picks up 5# objects from floor and reaches 10" without balance loss with supervision.     Time  4    Period  Weeks    Status  New    Target Date  03/30/18        PT Long Term Goals - 03/01/18 1802      PT LONG TERM GOAL #1   Title  Patient demonstrates & verbalizes understanding of prosthetic care to enable safe use of prosthesis. (All LTGs Target Date: 05/25/2018)    Time  12    Period  Weeks    Status  New    Target Date  05/25/18      PT LONG TERM GOAL #2   Title  Patient tolerates wear of prosthesis >90% of awake hours without skin or limb pain issues to enable function throughout his day.     Time  12    Period  Weeks    Status  New    Target Date  05/25/18      PT LONG TERM GOAL #3   Title  Patient ambulates >1000' outdoors including grass, ramps & curbs with prosthesis only independently for community mobility.     Time  12    Period  Weeks    Status  New    Target Date  05/25/18      PT LONG TERM GOAL #4   Title  Berg Balance >45/56 to indicate lower fall risk.    Time   12    Period  Weeks    Status  New    Target Date  05/25/18      PT LONG TERM GOAL #5   Title  Patient perform floor transfers, lifts & carries 25# and push/pull to enable to return to yard work & other patient goals.     Time  12    Period  Weeks    Status  New    Target Date  05/25/18            Plan - 03/05/18 0854    Clinical Impression Statement  Today's skilled session continued to address prosthetic education and mobility with prosthesis/RW. Pt and spouse educated that the cane is not a safe option at this time as pt arrived with both cane/RW and used cane to enter gym with PTA assistance. Both educated that the cane is decreased support and causing him to walk with a poor gait pattern which is hard to correct once started. Both verbalized understanding. Pt did improve on ramp negotiation with RW with repitition this session. Pt is progressing toward goals and should benefit from continued PT to progress toward unmet goals.  Rehab Potential  Good    PT Frequency  2x / week    PT Duration  12 weeks    PT Treatment/Interventions  ADLs/Self Care Home Management;Gait training;Stair training;Functional mobility training;Therapeutic activities;Therapeutic exercise;DME Instruction;Balance training;Neuromuscular re-education;Patient/family education;Prosthetic Training;Vestibular;Manual techniques    PT Next Visit Plan  continue to work on gait/barriers with RW with decreased UE support, begin to address balance activities.     Consulted and Agree with Plan of Care  Patient;Family member/caregiver    Family Member Consulted  wife, Adron Bene       Patient will benefit from skilled therapeutic intervention in order to improve the following deficits and impairments:  Abnormal gait, Decreased activity tolerance, Decreased balance, Decreased endurance, Decreased knowledge of use of DME, Decreased mobility, Decreased strength, Dizziness, Impaired  flexibility, Postural dysfunction, Prosthetic Dependency  Visit Diagnosis: Abnormal posture  Muscle weakness (generalized)  Other abnormalities of gait and mobility  Unsteadiness on feet     Problem List Patient Active Problem List   Diagnosis Date Noted  . Labile blood glucose   . Acute blood loss anemia   . Labile blood pressure   . Stage 3 chronic kidney disease ([place])   . Type 2 diabetes mellitus with peripheral neuropathy (HCC)   . Post-operative pain   . Amputation of left lower extremity below knee upon examination ([place]) 11/24/2017  . Amputated below knee, left ([place]) 11/22/2017  . Osteomyelitis of left foot ([place])   . Dehiscence of amputation stump ([place]) 08/11/2017  . S/P transmetatarsal amputation of foot, left ([place]) 07/19/2017  . Subacute osteomyelitis, left ankle and foot ([place]) 07/17/2017  . Diabetic ulcer of left foot associated with type 2 diabetes mellitus ([place])   . Diabetes mellitus with renal complications ([place]) 99/83/3825  . Diabetes ([place]) 10/13/2014  . Anemia 10/13/2014  . CKD (chronic kidney disease) stage 3, GFR 30-59 ml/min (HCC) 10/13/2014  . Hyperkalemia 10/13/2014    Willow Ora, PTA, [facility] [address] [place], [place] [address] [phone] 03/05/18, 2:46 PM   Name: Kevin Bradford MRN: [id number] Date of Birth: 08-28-1945

## 2018-03-07 ENCOUNTER — Ambulatory Visit: Payer: Medicare Other | Admitting: Physical Therapy

## 2018-03-07 ENCOUNTER — Encounter: Payer: Self-pay | Admitting: Physical Therapy

## 2018-03-07 DIAGNOSIS — R2681 Unsteadiness on feet: Secondary | ICD-10-CM | POA: Diagnosis not present

## 2018-03-07 DIAGNOSIS — R2689 Other abnormalities of gait and mobility: Secondary | ICD-10-CM | POA: Diagnosis not present

## 2018-03-07 DIAGNOSIS — Z9181 History of falling: Secondary | ICD-10-CM | POA: Diagnosis not present

## 2018-03-07 DIAGNOSIS — R293 Abnormal posture: Secondary | ICD-10-CM | POA: Diagnosis not present

## 2018-03-07 DIAGNOSIS — M6281 Muscle weakness (generalized): Secondary | ICD-10-CM | POA: Diagnosis not present

## 2018-03-07 NOTE — Therapy (Signed)
[facility] [address] [place], [place], [address] Phone: [phone]   Fax:  [phone]  Physical Therapy Treatment  Patient Details  Name: Kevin Bradford MRN: [id number] Date of Birth: December 20, 1945 Referring Provider: Meridee Score, MD   Encounter Date: 03/07/2018  PT End of Session - 03/07/18 0933    Visit Number  3    Number of Visits  25    Date for PT Re-Evaluation  05/30/18    Authorization Type  Medicare & AETNA Medicare 80% AETNA covers 20% once $300 deductible (at eval $91.17 met)    PT Start Time  0846    PT Stop Time  0930    PT Time Calculation (min)  44 min    Equipment Utilized During Treatment  Gait belt    Activity Tolerance  Patient tolerated treatment well;No increased pain    Behavior During Therapy  WFL for tasks assessed/performed       Past Medical History:  Diagnosis Date  . Arthritis    "joint pain" (08/16/2017)  . Cellulitis and abscess of leg 10/18/2014  . CKD (chronic kidney disease) stage 3, GFR 30-59 ml/min (HCC)    creatinine increases with antibiotics per pt, no known kidney disease per patient  . Dehiscence of closure of skin, subsequent encounter   . Diabetic foot ulcer ([place])    left  . Diabetic peripheral neuropathy ([place])   . GERD (gastroesophageal reflux disease)    12/26/2017- "not this year."   . Osteomyelitis ([place])    left transmetatarsal   . Type II diabetes mellitus ([place])     Past Surgical History:  Procedure Laterality Date  . AMPUTATION Left 07/19/2017   Procedure: LEFT TRANSMETATARSAL AMPUTATION;  Surgeon: Newt Minion, MD;  Location: [facility];  Service: Orthopedics;  Laterality: Left;  . AMPUTATION Left 08/16/2017   REVISION LEFT TRANSMETATARSAL AMPUTATION  . AMPUTATION Left 11/22/2017   Procedure: LEFT BELOW KNEE AMPUTATION;  Surgeon: Newt Minion, MD;  Location: [facility];  Service: Orthopedics;  Laterality: Left;  . CATARACT EXTRACTION W/ INTRAOCULAR LENS  IMPLANT,  BILATERAL  ~ 2016  . EYE SURGERY Bilateral    catarct  . I&D EXTREMITY Left 10/17/2014   Procedure: IRRIGATION AND DEBRIDEMENT EXTREMITY LEFT FOOT;  Surgeon: Newt Minion, MD;  Location: [facility];  Service: Orthopedics;  Laterality: Left;  . STUMP REVISION Left 08/16/2017   Procedure: REVISION LEFT TRANSMETATARSAL AMPUTATION;  Surgeon: Newt Minion, MD;  Location: ;  Service: Orthopedics;  Laterality: Left;  . STUMP REVISION Left 12/27/2017   Procedure: LEFT BELOW KNEE AMPUTATION REVISION;  Surgeon: Newt Minion, MD;  Location: [facility];  Service: Orthopedics;  Laterality: Left;  . TOE AMPUTATION Right 2013   5th toe    There were no vitals filed for this visit.  Subjective Assessment - 03/07/18 0847    Subjective  He is wearing prosthesis most of awake hours and removing for 1-1.5 hrs 2x /day    Pertinent History  L TTA, DM, CKD3, PVD, arthritis, peripheral neuropathy    Limitations  Lifting;Standing;Walking;House hold activities    Patient Stated Goals  To use prosthesis to be active fish, yard work, play with grandkids, ride bicycle. Swim in pool.     Currently in Pain?  No/denies                       [facility] Adult PT Treatment/Exercise - 03/07/18 5732  Transfers   Transfers  Sit to Stand;Stand to Sit    Sit to Stand  5: Supervision;With upper extremity assist;With armrests;From chair/3-in-1    Stand to Sit  5: Supervision;With upper extremity assist;With armrests;To chair/3-in-1      Ambulation/Gait   Ambulation/Gait  Yes    Ambulation/Gait Assistance  5: Supervision    Ambulation/Gait Assistance Details  tactile & verbal cues to move RW fluently without stopping and step thru pattern with equal step length, progressed to fluent walker motion with light UE support on RW.     Ambulation Distance (Feet)  300 Feet    Assistive device  Prosthesis;Rolling walker    Gait Pattern  Step-through pattern;Decreased arm swing - left;Decreased step length -  right;Decreased stance time - left;Decreased stride length;Decreased hip/knee flexion - left;Decreased weight shift to left;Left circumduction;Left hip hike;Antalgic;Lateral hip instability;Trunk flexed;Abducted - left;Poor foot clearance - left    Ambulation Surface  Indoor;Level    Stairs  --    Stairs Assistance  --    Stair Management Technique  --    Number of Stairs  --    Ramp  5: Supervision   RW & TTA prosthesis   Ramp Details (indicate cue type and reason)  demo & verbal cues on maintaining motion / gait pattern as level with shorter steps to wt bear over prosthesis in stance    Curb  5: Supervision    Curb Details (indicate cue type and reason)  verbal cues on technique with step through pattern, modified technique to limit wt on prostheis with wound      Neuro Re-ed    Neuro Re-ed Details   --      Prosthetics   Prosthetic Care Comments   Tegaderm with prosthesis wear & shrinker when prosthesis off /remove Tegaderm overnight;  Vacuum seal water bag for shower /travel/ pools;  standing to seat limb in socket & resetting suction sleeve to minimize pistoning.     Current prosthetic wear tolerance (days/week)   daily    Current prosthetic wear tolerance (#hours/day)   most awake hours removing 1-1.5hrs 2x/day;  PT recommended removing 1 hr 3x/day & checking for drainage/sweat/ fluid at least every 3 hrs.     Residual limb condition   small wound with minimal serous/slight blood drainage on lateral scar, otherwise normal color, temperature and skin condition    Education Provided  Residual limb care;Correct ply sock adjustment;Proper wear schedule/adjustment;Proper weight-bearing schedule/adjustment;Proper Donning;Other (comment)   see prosthetic care comments   Person(s) Educated  Patient;Spouse    Education Method  Explanation;Demonstration;Tactile cues;Verbal cues    Education Method  Verbalized understanding;Returned demonstration;Tactile cues required;Verbal cues required;Needs  further instruction               PT Short Term Goals - 03/07/18 0933      PT SHORT TERM GOAL #1   Title  Patient verbalizes proper cleaning & residual limb care and demonstrates proper donning. (All STGs Target Date: 03/30/2018)    Time  4    Period  Weeks    Status  On-going    Target Date  03/30/18      PT SHORT TERM GOAL #2   Title  Patient tolerates wear of prosthesis >12 hrs total / day without skin issues.     Time  4    Period  Weeks    Status  On-going    Target Date  03/30/18      PT SHORT TERM GOAL #3  Title  Patient ambulates 500' with cane & prosthesis with supervision.     Time  4    Period  Weeks    Status  On-going    Target Date  03/30/18      PT SHORT TERM GOAL #4   Title  Patient negotiates ramps, curbs and stairs with 1 rail with cane & prosthesis with supervision.     Time  4    Period  Weeks    Status  On-going    Target Date  03/30/18      PT SHORT TERM GOAL #5   Title  Patient picks up 5# objects from floor and reaches 10" without balance loss with supervision.     Time  4    Period  Weeks    Status  On-going    Target Date  03/30/18        PT Long Term Goals - 03/07/18 0934      PT LONG TERM GOAL #1   Title  Patient demonstrates & verbalizes understanding of prosthetic care to enable safe use of prosthesis. (All LTGs Target Date: 05/25/2018)    Time  12    Period  Weeks    Status  On-going    Target Date  05/25/18      PT LONG TERM GOAL #2   Title  Patient tolerates wear of prosthesis >90% of awake hours without skin or limb pain issues to enable function throughout his day.     Time  12    Period  Weeks    Status  On-going    Target Date  05/25/18      PT LONG TERM GOAL #3   Title  Patient ambulates >1000' outdoors including grass, ramps & curbs with prosthesis only independently for community mobility.     Time  12    Period  Weeks    Status  On-going    Target Date  05/25/18      PT LONG TERM GOAL #4   Title  Berg  Balance >45/56 to indicate lower fall risk.    Time  12    Period  Weeks    Status  On-going    Target Date  05/25/18      PT LONG TERM GOAL #5   Title  Patient perform floor transfers, lifts & carries 25# and push/pull to enable to return to yard work & other patient goals.     Time  12    Period  Weeks    Status  On-going    Target Date  05/25/18            Plan - 03/07/18 0936    Clinical Impression Statement  Today's skilled session focused on prosthetic education for safe wear/use and gait with RW developing habits that will transition easily to cane. Patient & wife appear to have improved understanding.     Rehab Potential  Good    PT Frequency  2x / week    PT Duration  12 weeks    PT Treatment/Interventions  ADLs/Self Care Home Management;Gait training;Stair training;Functional mobility training;Therapeutic activities;Therapeutic exercise;DME Instruction;Balance training;Neuromuscular re-education;Patient/family education;Prosthetic Training;Vestibular;Manual techniques    PT Next Visit Plan  check wound, if healing begin to transition to cane for limited distance/time like household, instruct how to pick up items from floor    Consulted and Agree with Plan of Care  Patient;Family member/caregiver    Family Member Consulted  wife, Adron Bene  Patient will benefit from skilled therapeutic intervention in order to improve the following deficits and impairments:  Abnormal gait, Decreased activity tolerance, Decreased balance, Decreased endurance, Decreased knowledge of use of DME, Decreased mobility, Decreased strength, Dizziness, Impaired flexibility, Postural dysfunction, Prosthetic Dependency  Visit Diagnosis: Muscle weakness (generalized)  Other abnormalities of gait and mobility  Unsteadiness on feet     Problem List Patient Active Problem List   Diagnosis Date Noted  . Labile blood glucose   . Acute blood loss anemia   . Labile blood pressure   .  Stage 3 chronic kidney disease ([place])   . Type 2 diabetes mellitus with peripheral neuropathy (HCC)   . Post-operative pain   . Amputation of left lower extremity below knee upon examination ([place]) 11/24/2017  . Amputated below knee, left ([place]) 11/22/2017  . Osteomyelitis of left foot ([place])   . Dehiscence of amputation stump ([place]) 08/11/2017  . S/P transmetatarsal amputation of foot, left ([place]) 07/19/2017  . Subacute osteomyelitis, left ankle and foot ([place]) 07/17/2017  . Diabetic ulcer of left foot associated with type 2 diabetes mellitus ([place])   . Diabetes mellitus with renal complications ([place]) 36/43/8377  . Diabetes ([place]) 10/13/2014  . Anemia 10/13/2014  . CKD (chronic kidney disease) stage 3, GFR 30-59 ml/min (HCC) 10/13/2014  . Hyperkalemia 10/13/2014    Jakevion Arney PT, DPT 03/07/2018, 9:49 AM  [facility] [address] [place], [place], [address] Phone: [phone]   Fax:  [phone]  Name: Kevin Bradford MRN: [id number] Date of Birth: 1945/12/01

## 2018-03-14 ENCOUNTER — Encounter: Payer: Self-pay | Admitting: Physical Therapy

## 2018-03-14 ENCOUNTER — Ambulatory Visit: Payer: Medicare Other | Admitting: Physical Therapy

## 2018-03-14 DIAGNOSIS — M6281 Muscle weakness (generalized): Secondary | ICD-10-CM | POA: Diagnosis not present

## 2018-03-14 DIAGNOSIS — R293 Abnormal posture: Secondary | ICD-10-CM

## 2018-03-14 DIAGNOSIS — R2681 Unsteadiness on feet: Secondary | ICD-10-CM | POA: Diagnosis not present

## 2018-03-14 DIAGNOSIS — Z9181 History of falling: Secondary | ICD-10-CM | POA: Diagnosis not present

## 2018-03-14 DIAGNOSIS — R2689 Other abnormalities of gait and mobility: Secondary | ICD-10-CM | POA: Diagnosis not present

## 2018-03-14 NOTE — Therapy (Signed)
[facility] [address] [place], [place], [address] Phone: [phone]   Fax:  [phone]  Physical Therapy Treatment  Patient Details  Name: Kevin Bradford MRN: [id number] Date of Birth: Jun 11, 1946 Referring Provider: Meridee Score, MD   Encounter Date: 03/14/2018  PT End of Session - 03/14/18 1450    Visit Number  4    Number of Visits  25    Date for PT Re-Evaluation  05/30/18    Authorization Type  Medicare & AETNA Medicare 80% AETNA covers 20% once $300 deductible (at eval $91.17 met)    PT Start Time  1400    PT Stop Time  1448    PT Time Calculation (min)  48 min    Equipment Utilized During Treatment  Gait belt    Activity Tolerance  Patient tolerated treatment well;No increased pain    Behavior During Therapy  WFL for tasks assessed/performed       Past Medical History:  Diagnosis Date  . Arthritis    "joint pain" (08/16/2017)  . Cellulitis and abscess of leg 10/18/2014  . CKD (chronic kidney disease) stage 3, GFR 30-59 ml/min (HCC)    creatinine increases with antibiotics per pt, no known kidney disease per patient  . Dehiscence of closure of skin, subsequent encounter   . Diabetic foot ulcer ([place])    left  . Diabetic peripheral neuropathy ([place])   . GERD (gastroesophageal reflux disease)    12/26/2017- "not this year."   . Osteomyelitis ([place])    left transmetatarsal   . Type II diabetes mellitus ([place])     Past Surgical History:  Procedure Laterality Date  . AMPUTATION Left 07/19/2017   Procedure: LEFT TRANSMETATARSAL AMPUTATION;  Surgeon: Newt Minion, MD;  Location: [facility];  Service: Orthopedics;  Laterality: Left;  . AMPUTATION Left 08/16/2017   REVISION LEFT TRANSMETATARSAL AMPUTATION  . AMPUTATION Left 11/22/2017   Procedure: LEFT BELOW KNEE AMPUTATION;  Surgeon: Newt Minion, MD;  Location: [facility];  Service: Orthopedics;  Laterality: Left;  . CATARACT EXTRACTION W/ INTRAOCULAR LENS  IMPLANT,  BILATERAL  ~ 2016  . EYE SURGERY Bilateral    catarct  . I&D EXTREMITY Left 10/17/2014   Procedure: IRRIGATION AND DEBRIDEMENT EXTREMITY LEFT FOOT;  Surgeon: Newt Minion, MD;  Location: [facility];  Service: Orthopedics;  Laterality: Left;  . STUMP REVISION Left 08/16/2017   Procedure: REVISION LEFT TRANSMETATARSAL AMPUTATION;  Surgeon: Newt Minion, MD;  Location: [facility];  Service: Orthopedics;  Laterality: Left;  . STUMP REVISION Left 12/27/2017   Procedure: LEFT BELOW KNEE AMPUTATION REVISION;  Surgeon: Newt Minion, MD;  Location: [facility];  Service: Orthopedics;  Laterality: Left;  . TOE AMPUTATION Right 2013   5th toe    There were no vitals filed for this visit.  Subjective Assessment - 03/14/18 1400    Subjective  He is wearing prosthesis most of awake hours and removing for 1-1.5 hrs 2x /day. He thinks limb is healing.     Pertinent History  L TTA, DM, CKD3, PVD, arthritis, peripheral neuropathy    Limitations  Lifting;Standing;Walking;House hold activities    Patient Stated Goals  To use prosthesis to be active fish, yard work, play with grandkids, ride bicycle. Swim in pool.     Currently in Pain?  No/denies                       [facility] Adult PT Treatment/Exercise - 03/14/18  1400      Transfers   Transfers  Sit to Stand;Stand to Sit    Sit to Stand  5: Supervision;With upper extremity assist;With armrests;From chair/3-in-1    Stand to Sit  5: Supervision;With upper extremity assist;With armrests;To chair/3-in-1      Ambulation/Gait   Ambulation/Gait  Yes    Ambulation/Gait Assistance  5: Supervision;4: Min guard    Ambulation/Gait Assistance Details  visual using line on floor, progressed to proprioception between proper/ incorrect step width.     Ambulation Distance (Feet)  300 Feet    Assistive device  Prosthesis;Straight cane    Gait Pattern  Step-through pattern;Decreased arm swing - left;Decreased step length - right;Decreased stance time - left;Decreased  stride length;Decreased hip/knee flexion - left;Decreased weight shift to left;Left circumduction;Left hip hike;Antalgic;Lateral hip instability;Trunk flexed;Abducted - left;Poor foot clearance - left    Ambulation Surface  Indoor;Level    Ramp  --    Curb  --      Prosthetics   Prosthetic Care Comments   Monitor wound if no increase size can wear prosthesis without Tegaderm.  PT instructed in signs of sweating.  PT recommended increase wear to all awake hours except 1 hour midday and drying limb/liner midmorning & midafternoon wear plus prn with any signs of sweating.   PT demo, instructed in technique to pedal stationary bike with TTA prosthesis.     Current prosthetic wear tolerance (days/week)   daily    Current prosthetic wear tolerance (#hours/day)   most awake hours removing 1-1.5hrs 2x/day;  PT recommended removing 1 hr 3x/day & checking for drainage/sweat/ fluid at least every 3 hrs.     Residual limb condition   small wound with minimal serous/slight blood drainage on lateral scar, otherwise normal color, temperature and skin condition    Education Provided  Residual limb care;Correct ply sock adjustment;Proper wear schedule/adjustment;Proper weight-bearing schedule/adjustment;Proper Donning;Other (comment)   see prosthetic care comments   Person(s) Educated  Patient;Spouse    Education Method  Explanation;Demonstration;Tactile cues;Verbal cues    Education Method  Verbalized understanding;Returned demonstration;Tactile cues required;Verbal cues required;Needs further instruction               PT Short Term Goals - 03/07/18 0933      PT SHORT TERM GOAL #1   Title  Patient verbalizes proper cleaning & residual limb care and demonstrates proper donning. (All STGs Target Date: 03/30/2018)    Time  4    Period  Weeks    Status  On-going    Target Date  03/30/18      PT SHORT TERM GOAL #2   Title  Patient tolerates wear of prosthesis >12 hrs total / day without skin issues.      Time  4    Period  Weeks    Status  On-going    Target Date  03/30/18      PT SHORT TERM GOAL #3   Title  Patient ambulates 500' with cane & prosthesis with supervision.     Time  4    Period  Weeks    Status  On-going    Target Date  03/30/18      PT SHORT TERM GOAL #4   Title  Patient negotiates ramps, curbs and stairs with 1 rail with cane & prosthesis with supervision.     Time  4    Period  Weeks    Status  On-going    Target Date  03/30/18  PT SHORT TERM GOAL #5   Title  Patient picks up 5# objects from floor and reaches 10" without balance loss with supervision.     Time  4    Period  Weeks    Status  On-going    Target Date  03/30/18        PT Long Term Goals - 03/07/18 0934      PT LONG TERM GOAL #1   Title  Patient demonstrates & verbalizes understanding of prosthetic care to enable safe use of prosthesis. (All LTGs Target Date: 05/25/2018)    Time  12    Period  Weeks    Status  On-going    Target Date  05/25/18      PT LONG TERM GOAL #2   Title  Patient tolerates wear of prosthesis >90% of awake hours without skin or limb pain issues to enable function throughout his day.     Time  12    Period  Weeks    Status  On-going    Target Date  05/25/18      PT LONG TERM GOAL #3   Title  Patient ambulates >1000' outdoors including grass, ramps & curbs with prosthesis only independently for community mobility.     Time  12    Period  Weeks    Status  On-going    Target Date  05/25/18      PT LONG TERM GOAL #4   Title  Berg Balance >45/56 to indicate lower fall risk.    Time  12    Period  Weeks    Status  On-going    Target Date  05/25/18      PT LONG TERM GOAL #5   Title  Patient perform floor transfers, lifts & carries 25# and push/pull to enable to return to yard work & other patient goals.     Time  12    Period  Weeks    Status  On-going    Target Date  05/25/18            Plan - 03/14/18 2200    Clinical Impression Statement   Patient improved prosthetic gait with cane and seems to understand need to limit distance with quality of gait primary indicator to limitation. Pt has general understanding how to pedal stationary bike with recommendation to start with only 5 minutes & slowly increase if no issues.     Rehab Potential  Good    PT Frequency  2x / week    PT Duration  12 weeks    PT Treatment/Interventions  ADLs/Self Care Home Management;Gait training;Stair training;Functional mobility training;Therapeutic activities;Therapeutic exercise;DME Instruction;Balance training;Neuromuscular re-education;Patient/family education;Prosthetic Training;Vestibular;Manual techniques    PT Next Visit Plan  check wound, instruct how to pick up items from floor, prosthetic gait with cane including ramps, curbs & stairs. Question tasks /activities with planned vacation    Consulted and Agree with Plan of Care  Patient;Family member/caregiver    Family Member Consulted  wife, Adron Bene       Patient will benefit from skilled therapeutic intervention in order to improve the following deficits and impairments:  Abnormal gait, Decreased activity tolerance, Decreased balance, Decreased endurance, Decreased knowledge of use of DME, Decreased mobility, Decreased strength, Dizziness, Impaired flexibility, Postural dysfunction, Prosthetic Dependency  Visit Diagnosis: Muscle weakness (generalized)  Other abnormalities of gait and mobility  Unsteadiness on feet  Abnormal posture     Problem List Patient Active Problem List   Diagnosis  Date Noted  . Labile blood glucose   . Acute blood loss anemia   . Labile blood pressure   . Stage 3 chronic kidney disease ([place])   . Type 2 diabetes mellitus with peripheral neuropathy (HCC)   . Post-operative pain   . Amputation of left lower extremity below knee upon examination ([place]) 11/24/2017  . Amputated below knee, left ([place]) 11/22/2017  . Osteomyelitis of left foot ([place])   .  Dehiscence of amputation stump ([place]) 08/11/2017  . S/P transmetatarsal amputation of foot, left ([place]) 07/19/2017  . Subacute osteomyelitis, left ankle and foot ([place]) 07/17/2017  . Diabetic ulcer of left foot associated with type 2 diabetes mellitus ([place])   . Diabetes mellitus with renal complications ([place]) 57/26/2035  . Diabetes ([place]) 10/13/2014  . Anemia 10/13/2014  . CKD (chronic kidney disease) stage 3, GFR 30-59 ml/min (HCC) 10/13/2014  . Hyperkalemia 10/13/2014    WALDRON,ROBIN PT, DPT 03/14/2018, 10:04 PM  [facility] [address] [place], [place], [address] Phone: [phone]   Fax:  [phone]  Name: Kevin Bradford MRN: [id number] Date of Birth: 10/08/1945

## 2018-03-16 ENCOUNTER — Encounter: Payer: Self-pay | Admitting: Physical Therapy

## 2018-03-16 ENCOUNTER — Ambulatory Visit: Payer: Medicare Other | Admitting: Physical Therapy

## 2018-03-16 DIAGNOSIS — R2681 Unsteadiness on feet: Secondary | ICD-10-CM | POA: Diagnosis not present

## 2018-03-16 DIAGNOSIS — R2689 Other abnormalities of gait and mobility: Secondary | ICD-10-CM | POA: Diagnosis not present

## 2018-03-16 DIAGNOSIS — M6281 Muscle weakness (generalized): Secondary | ICD-10-CM

## 2018-03-16 DIAGNOSIS — R293 Abnormal posture: Secondary | ICD-10-CM

## 2018-03-16 DIAGNOSIS — Z9181 History of falling: Secondary | ICD-10-CM | POA: Diagnosis not present

## 2018-03-17 NOTE — Therapy (Signed)
[facility] [address] [place], [place], [address] Phone: [phone]   Fax:  [phone]  Physical Therapy Treatment  Patient Details  Name: Kevin Bradford MRN: [id number] Date of Birth: [birth year] Referring Provider: Meridee Score, MD   Encounter Date: 03/16/2018  PT End of Session - 03/16/18 0853    Visit Number  5    Number of Visits  25    Date for PT Re-Evaluation  05/30/18    Authorization Type  Medicare & AETNA Medicare 80% AETNA covers 20% once $300 deductible (at eval $91.17 met)    PT Start Time  0849    PT Stop Time  0930    PT Time Calculation (min)  41 min    Equipment Utilized During Treatment  Gait belt    Activity Tolerance  Patient tolerated treatment well;No increased pain    Behavior During Therapy  WFL for tasks assessed/performed       Past Medical History:  Diagnosis Date  . Arthritis    "joint pain" (08/16/2017)  . Cellulitis and abscess of leg 10/18/2014  . CKD (chronic kidney disease) stage 3, GFR 30-59 ml/min (HCC)    creatinine increases with antibiotics per pt, no known kidney disease per patient  . Dehiscence of closure of skin, subsequent encounter   . Diabetic foot ulcer ([place])    left  . Diabetic peripheral neuropathy ([place])   . GERD (gastroesophageal reflux disease)    12/26/2017- "not this year."   . Osteomyelitis ([place])    left transmetatarsal   . Type II diabetes mellitus ([place])     Past Surgical History:  Procedure Laterality Date  . AMPUTATION Left 07/19/2017   Procedure: LEFT TRANSMETATARSAL AMPUTATION;  Surgeon: Newt Minion, MD;  Location: [facility];  Service: Orthopedics;  Laterality: Left;  . AMPUTATION Left 08/16/2017   REVISION LEFT TRANSMETATARSAL AMPUTATION  . AMPUTATION Left 11/22/2017   Procedure: LEFT BELOW KNEE AMPUTATION;  Surgeon: Newt Minion, MD;  Location: [facility];  Service: Orthopedics;  Laterality: Left;  . CATARACT EXTRACTION W/ INTRAOCULAR LENS  IMPLANT,  BILATERAL  ~ 2016  . EYE SURGERY Bilateral    catarct  . I&D EXTREMITY Left 10/17/2014   Procedure: IRRIGATION AND DEBRIDEMENT EXTREMITY LEFT FOOT;  Surgeon: Newt Minion, MD;  Location: [facility];  Service: Orthopedics;  Laterality: Left;  . STUMP REVISION Left 08/16/2017   Procedure: REVISION LEFT TRANSMETATARSAL AMPUTATION;  Surgeon: Newt Minion, MD;  Location: [facility];  Service: Orthopedics;  Laterality: Left;  . STUMP REVISION Left 12/27/2017   Procedure: LEFT BELOW KNEE AMPUTATION REVISION;  Surgeon: Newt Minion, MD;  Location: [facility];  Service: Orthopedics;  Laterality: Left;  . TOE AMPUTATION Right 2013   5th toe    There were no vitals filed for this visit.  Subjective Assessment - 03/16/18 0853    Subjective  No new complaints. No falls. Used cane to walk into clinic with PTA guarding him.    Pertinent History  L TTA, DM, CKD3, PVD, arthritis, peripheral neuropathy    Limitations  Lifting;Standing;Walking;House hold activities    Patient Stated Goals  To use prosthesis to be active fish, yard work, play with grandkids, ride bicycle. Swim in pool.     Currently in Pain?  No/denies           [facility] Adult PT Treatment/Exercise - 03/16/18 0854      Transfers   Transfers  Sit to Stand;Stand to Sit  Sit to Stand  5: Supervision;With upper extremity assist;With armrests;From chair/3-in-1    Stand to Sit  5: Supervision;With upper extremity assist;With armrests;To chair/3-in-1      Ambulation/Gait   Ambulation/Gait  Yes    Ambulation/Gait Assistance  5: Supervision;4: Min guard    Ambulation/Gait Assistance Details  cues on posture, step length and weight shifting. no balance issues noted.     Ambulation Distance (Feet)  450 Feet    Assistive device  Prosthesis;Straight cane    Gait Pattern  Step-through pattern;Decreased arm swing - left;Decreased step length - right;Decreased stance time - left;Decreased stride length;Decreased hip/knee flexion - left;Decreased weight shift  to left;Left circumduction;Left hip hike;Antalgic;Lateral hip instability;Trunk flexed;Abducted - left;Poor foot clearance - left    Ambulation Surface  Level;Indoor    Stairs  Yes    Stairs Assistance  5: Supervision    Stairs Assistance Details (indicate cue type and reason)  1 rep with rail/cane combo with step to pattern at supervision level. 2 reps with bil rails working on reciprocal pattern with min guard assist with cues on weight shifitng and prosthetic foot placement with descending.     Stair Management Technique  One rail Right;Step to pattern;Forwards;Two rails;Alternating pattern    Number of Stairs  4    Height of Stairs  6    Ramp  5: Supervision    Ramp Details (indicate cue type and reason)  with cane/prosthesis x 3 reps, reminder cues on sequencing and technique.      High Level Balance   High Level Balance Activities  Figure 8 turns;Negotiating over obstacles    High Level Balance Comments  with cane/prosthesis: figure 8's around 2 hoola hoops on floor working on pelvic positioning/step length to achieve this; fwd stepping over bolsters of varied heights with cues on correct sequencing x 4 laps over 3 obstacles. min guard to min assist for balance with these activities.                                Prosthetics   Prosthetic Care Comments   wound continues to have small amount of drainage. recommended continued use of Tegaderm at this time.     Current prosthetic wear tolerance (days/week)   daily    Current prosthetic wear tolerance (#hours/day)   most awake hours, drying every 3-4 hours    Residual limb condition   small wound with minimal serous/slight blood drainage on lateral scar. also noted small hole like area on just medial to center of incision that appears to be trying to open up. Spouse and pt     Education Provided  Residual limb care;Proper wear schedule/adjustment;Proper weight-bearing schedule/adjustment    Person(s) Educated  Patient;Spouse    Education  Method  Explanation;Demonstration;Verbal cues    Education Method  Verbalized understanding;Tactile cues required;Verbal cues required;Needs further instruction    Donning Prosthesis  Supervision    Doffing Prosthesis  Supervision          PT Short Term Goals - 03/07/18 0933      PT SHORT TERM GOAL #1   Title  Patient verbalizes proper cleaning & residual limb care and demonstrates proper donning. (All STGs Target Date: 03/30/2018)    Time  4    Period  Weeks    Status  On-going    Target Date  03/30/18      PT SHORT TERM GOAL #2   Title  Patient  tolerates wear of prosthesis >12 hrs total / day without skin issues.     Time  4    Period  Weeks    Status  On-going    Target Date  03/30/18      PT SHORT TERM GOAL #3   Title  Patient ambulates 500' with cane & prosthesis with supervision.     Time  4    Period  Weeks    Status  On-going    Target Date  03/30/18      PT SHORT TERM GOAL #4   Title  Patient negotiates ramps, curbs and stairs with 1 rail with cane & prosthesis with supervision.     Time  4    Period  Weeks    Status  On-going    Target Date  03/30/18      PT SHORT TERM GOAL #5   Title  Patient picks up 5# objects from floor and reaches 10" without balance loss with supervision.     Time  4    Period  Weeks    Status  On-going    Target Date  03/30/18        PT Long Term Goals - 03/07/18 0934      PT LONG TERM GOAL #1   Title  Patient demonstrates & verbalizes understanding of prosthetic care to enable safe use of prosthesis. (All LTGs Target Date: 05/25/2018)    Time  12    Period  Weeks    Status  On-going    Target Date  05/25/18      PT LONG TERM GOAL #2   Title  Patient tolerates wear of prosthesis >90% of awake hours without skin or limb pain issues to enable function throughout his day.     Time  12    Period  Weeks    Status  On-going    Target Date  05/25/18      PT LONG TERM GOAL #3   Title  Patient ambulates >1000' outdoors  including grass, ramps & curbs with prosthesis only independently for community mobility.     Time  12    Period  Weeks    Status  On-going    Target Date  05/25/18      PT LONG TERM GOAL #4   Title  Berg Balance >45/56 to indicate lower fall risk.    Time  12    Period  Weeks    Status  On-going    Target Date  05/25/18      PT LONG TERM GOAL #5   Title  Patient perform floor transfers, lifts & carries 25# and push/pull to enable to return to yard work & other patient goals.     Time  12    Period  Weeks    Status  On-going    Target Date  05/25/18            Plan - 03/16/18 0854    Clinical Impression Statement  Today's skilled session continued to address gait/barriers/general mobility with use of cane/prosthesis. The pt is progressing well and should benefit from continued PT to progress toward unmet goals.     Rehab Potential  Good    PT Frequency  2x / week    PT Duration  12 weeks    PT Treatment/Interventions  ADLs/Self Care Home Management;Gait training;Stair training;Functional mobility training;Therapeutic activities;Therapeutic exercise;DME Instruction;Balance training;Neuromuscular re-education;Patient/family education;Prosthetic Training;Vestibular;Manual techniques    PT Next Visit Plan  check wound, instruct how to pick up items from floor, prosthetic gait with cane including ramps, curbs & stairs.    Consulted and Agree with Plan of Care  Patient;Family member/caregiver    Family Member Consulted  wife, Adron Bene       Patient will benefit from skilled therapeutic intervention in order to improve the following deficits and impairments:  Abnormal gait, Decreased activity tolerance, Decreased balance, Decreased endurance, Decreased knowledge of use of DME, Decreased mobility, Decreased strength, Dizziness, Impaired flexibility, Postural dysfunction, Prosthetic Dependency  Visit Diagnosis: Muscle weakness (generalized)  Other abnormalities of gait and  mobility  Unsteadiness on feet  Abnormal posture     Problem List Patient Active Problem List   Diagnosis Date Noted  . Labile blood glucose   . Acute blood loss anemia   . Labile blood pressure   . Stage 3 chronic kidney disease ([place])   . Type 2 diabetes mellitus with peripheral neuropathy (HCC)   . Post-operative pain   . Amputation of left lower extremity below knee upon examination ([place]) 11/24/2017  . Amputated below knee, left ([place]) 11/22/2017  . Osteomyelitis of left foot ([place])   . Dehiscence of amputation stump ([place]) 08/11/2017  . S/P transmetatarsal amputation of foot, left ([place]) 07/19/2017  . Subacute osteomyelitis, left ankle and foot ([place]) 07/17/2017  . Diabetic ulcer of left foot associated with type 2 diabetes mellitus ([place])   . Diabetes mellitus with renal complications ([place]) 15/11/6977  . Diabetes ([place]) 10/13/2014  . Anemia 10/13/2014  . CKD (chronic kidney disease) stage 3, GFR 30-59 ml/min (HCC) 10/13/2014  . Hyperkalemia 10/13/2014    Willow Ora, PTA, [facility] [address] [place], [place] [address] [phone] 03/17/18, 4:34 PM   Name: Kevin Bradford MRN: [id number] Date of Birth: [birth year]

## 2018-03-26 ENCOUNTER — Encounter: Payer: Self-pay | Admitting: Physical Therapy

## 2018-03-26 ENCOUNTER — Ambulatory Visit: Payer: Medicare Other | Attending: Orthopedic Surgery | Admitting: Physical Therapy

## 2018-03-26 DIAGNOSIS — R2689 Other abnormalities of gait and mobility: Secondary | ICD-10-CM | POA: Diagnosis not present

## 2018-03-26 DIAGNOSIS — M6281 Muscle weakness (generalized): Secondary | ICD-10-CM | POA: Diagnosis not present

## 2018-03-26 DIAGNOSIS — R2681 Unsteadiness on feet: Secondary | ICD-10-CM | POA: Diagnosis not present

## 2018-03-26 DIAGNOSIS — R293 Abnormal posture: Secondary | ICD-10-CM | POA: Insufficient documentation

## 2018-03-27 NOTE — Therapy (Signed)
[facility] [address] [place], [place], [address] Phone: [phone]   Fax:  [phone]  Physical Therapy Treatment  Patient Details  Name: Kevin Bradford MRN: [id number] Date of Birth: [birth year] Referring Provider: Meridee Score, MD   Encounter Date: 03/26/2018    03/26/18 0855  PT Visits / Re-Eval  Visit Number 6  Number of Visits 25  Date for PT Re-Evaluation 05/30/18  Authorization  Authorization Type Medicare & AETNA Medicare 80% AETNA covers 20% once $300 deductible (at eval $91.17 met)  PT Time Calculation  PT Start Time 0848  PT Stop Time 0928  PT Time Calculation (min) 40 min  PT - End of Session  Equipment Utilized During Treatment Gait belt  Activity Tolerance Patient tolerated treatment well;No increased pain  Behavior During Therapy WFL for tasks assessed/performed     Past Medical History:  Diagnosis Date  . Arthritis    "joint pain" (08/16/2017)  . Cellulitis and abscess of leg 10/18/2014  . CKD (chronic kidney disease) stage 3, GFR 30-59 ml/min (HCC)    creatinine increases with antibiotics per pt, no known kidney disease per patient  . Dehiscence of closure of skin, subsequent encounter   . Diabetic foot ulcer ([place])    left  . Diabetic peripheral neuropathy ([place])   . GERD (gastroesophageal reflux disease)    12/26/2017- "not this year."   . Osteomyelitis ([place])    left transmetatarsal   . Type II diabetes mellitus ([place])     Past Surgical History:  Procedure Laterality Date  . AMPUTATION Left 07/19/2017   Procedure: LEFT TRANSMETATARSAL AMPUTATION;  Surgeon: Newt Minion, MD;  Location: [facility];  Service: Orthopedics;  Laterality: Left;  . AMPUTATION Left 08/16/2017   REVISION LEFT TRANSMETATARSAL AMPUTATION  . AMPUTATION Left 11/22/2017   Procedure: LEFT BELOW KNEE AMPUTATION;  Surgeon: Newt Minion, MD;  Location: [facility];  Service: Orthopedics;  Laterality: Left;  . CATARACT EXTRACTION  W/ INTRAOCULAR LENS  IMPLANT, BILATERAL  ~ 2016  . EYE SURGERY Bilateral    catarct  . I&D EXTREMITY Left 10/17/2014   Procedure: IRRIGATION AND DEBRIDEMENT EXTREMITY LEFT FOOT;  Surgeon: Newt Minion, MD;  Location: [facility];  Service: Orthopedics;  Laterality: Left;  . STUMP REVISION Left 08/16/2017   Procedure: REVISION LEFT TRANSMETATARSAL AMPUTATION;  Surgeon: Newt Minion, MD;  Location: [facility];  Service: Orthopedics;  Laterality: Left;  . STUMP REVISION Left 12/27/2017   Procedure: LEFT BELOW KNEE AMPUTATION REVISION;  Surgeon: Newt Minion, MD;  Location: [facility];  Service: Orthopedics;  Laterality: Left;  . TOE AMPUTATION Right 2013   5th toe    There were no vitals filed for this visit.    03/26/18 0854  Symptoms/Limitations  Subjective Had a good trip. Walked most of the trip with walker and cane, and was able to negotiate the multilevel home with stairs/rails.   Pertinent History L TTA, DM, CKD3, PVD, arthritis, peripheral neuropathy  Limitations Lifting;Standing;Walking;House hold activities  Patient Stated Goals To use prosthesis to be active fish, yard work, play with grandkids, ride bicycle. Swim in pool.   Pain Assessment  Currently in Pain? No/denies      03/26/18 0855  Transfers  Transfers Sit to Stand;Stand to Sit  Sit to Stand 5: Supervision;With upper extremity assist;With armrests;From chair/3-in-1  Stand to Sit 5: Supervision;With upper extremity assist;With armrests;To chair/3-in-1  Ambulation/Gait  Ambulation/Gait Yes  Ambulation/Gait Assistance 5: Supervision;4: Min guard  Ambulation/Gait Assistance Details  min guard assist only on outdoor compliant surfaces for safety with no significant balance loss noted, only mild instability noted. continued cues for cane placement, stance time on prosthesis and for more equal step length.                              Ambulation Distance (Feet) 450 Feet (x1, 500 x1, 120 )  Assistive device Prosthesis;Straight cane;None   Gait Pattern Step-through pattern;Decreased arm swing - left;Decreased step length - right;Decreased stance time - left;Decreased stride length;Decreased hip/knee flexion - left;Decreased weight shift to left;Left circumduction;Left hip hike;Antalgic;Lateral hip instability;Trunk flexed;Abducted - left;Poor foot clearance - left  Ambulation Surface Level;Indoor;Unlevel;Outdoor;Paved;Gravel;Grass  High Level Balance  High Level Balance Activities Negotiating over obstacles  High Level Balance Comments with cane/prosthesis: stepping over bolsters of different heights with reminder cues on correct sequencing.                     Therapeutic Activites   Therapeutic Activities Lifting  Lifting 10# crate: PTA demo'd correct technique for lifting from ground height with prosthesis. Pt repeated x 3 reps with cues needed, min guard assistance.                            Prosthetics  Prosthetic Care Comments  wound is healed. no longer appears to need Tegaderm. Pt and spouse educated on what to look for if wound should reappear or new ones develope.  Current prosthetic wear tolerance (days/week)  daily  Current prosthetic wear tolerance (#hours/day)  most awake hours, drying every 3-4 hours  Residual limb condition  continues with a small wound at lateral incision with loose tissued (still adheared), min amount of serous drainage present.   Education Provided Residual limb care;Proper weight-bearing schedule/adjustment  Person(s) Educated Patient;Spouse  Education Method Demonstration;Explanation;Verbal cues  Education Method Verbalized understanding;Tactile cues required;Verbal cues required;Needs further instruction  Donning Prosthesis 5  Doffing Prosthesis 5        PT Short Term Goals - 03/07/18 0933      PT SHORT TERM GOAL #1   Title  Patient verbalizes proper cleaning & residual limb care and demonstrates proper donning. (All STGs Target Date: 03/30/2018)    Time  4    Period  Weeks     Status  On-going    Target Date  03/30/18      PT SHORT TERM GOAL #2   Title  Patient tolerates wear of prosthesis >12 hrs total / day without skin issues.     Time  4    Period  Weeks    Status  On-going    Target Date  03/30/18      PT SHORT TERM GOAL #3   Title  Patient ambulates 500' with cane & prosthesis with supervision.     Time  4    Period  Weeks    Status  On-going    Target Date  03/30/18      PT SHORT TERM GOAL #4   Title  Patient negotiates ramps, curbs and stairs with 1 rail with cane & prosthesis with supervision.     Time  4    Period  Weeks    Status  On-going    Target Date  03/30/18      PT SHORT TERM GOAL #5   Title  Patient picks up 5# objects from floor and reaches  10" without balance loss with supervision.     Time  4    Period  Weeks    Status  On-going    Target Date  03/30/18        PT Long Term Goals - 03/07/18 0934      PT LONG TERM GOAL #1   Title  Patient demonstrates & verbalizes understanding of prosthetic care to enable safe use of prosthesis. (All LTGs Target Date: 05/25/2018)    Time  12    Period  Weeks    Status  On-going    Target Date  05/25/18      PT LONG TERM GOAL #2   Title  Patient tolerates wear of prosthesis >90% of awake hours without skin or limb pain issues to enable function throughout his day.     Time  12    Period  Weeks    Status  On-going    Target Date  05/25/18      PT LONG TERM GOAL #3   Title  Patient ambulates >1000' outdoors including grass, ramps & curbs with prosthesis only independently for community mobility.     Time  12    Period  Weeks    Status  On-going    Target Date  05/25/18      PT LONG TERM GOAL #4   Title  Berg Balance >45/56 to indicate lower fall risk.    Time  12    Period  Weeks    Status  On-going    Target Date  05/25/18      PT LONG TERM GOAL #5   Title  Patient perform floor transfers, lifts & carries 25# and push/pull to enable to return to yard work & other patient  goals.     Time  12    Period  Weeks    Status  On-going    Target Date  05/25/18         03/26/18 0855  Plan  Clinical Impression Statement Today's skilled session continued to address prosthetic management, skin integrity, gait with cane on various surfaces and initiated lifting instruction. The pt was able to return demo lifting with good technique/body mechanics after skilled instruction. He should benefit from continued PT to progress toward unmet goals.                           Pt will benefit from skilled therapeutic intervention in order to improve on the following deficits Abnormal gait;Decreased activity tolerance;Decreased balance;Decreased endurance;Decreased knowledge of use of DME;Decreased mobility;Decreased strength;Dizziness;Impaired flexibility;Postural dysfunction;Prosthetic Dependency  Rehab Potential Good  PT Frequency 2x / week  PT Duration 12 weeks  PT Treatment/Interventions ADLs/Self Care Home Management;Gait training;Stair training;Functional mobility training;Therapeutic activities;Therapeutic exercise;DME Instruction;Balance training;Neuromuscular re-education;Patient/family education;Prosthetic Training;Vestibular;Manual techniques  PT Next Visit Plan balance activities working on decreased UE support, gait distance with cane on outdoor surfaces, begin gait with prosthesis only indoors  Consulted and Agree with Plan of Care Patient;Family member/caregiver  Family Member Consulted wife, Adron Bene          Patient will benefit from skilled therapeutic intervention in order to improve the following deficits and impairments:  Abnormal gait, Decreased activity tolerance, Decreased balance, Decreased endurance, Decreased knowledge of use of DME, Decreased mobility, Decreased strength, Dizziness, Impaired flexibility, Postural dysfunction, Prosthetic Dependency  Visit Diagnosis: Muscle weakness (generalized)  Other abnormalities of gait and  mobility  Unsteadiness on feet  Abnormal posture  Problem List Patient Active Problem List   Diagnosis Date Noted  . Labile blood glucose   . Acute blood loss anemia   . Labile blood pressure   . Stage 3 chronic kidney disease ([place])   . Type 2 diabetes mellitus with peripheral neuropathy (HCC)   . Post-operative pain   . Amputation of left lower extremity below knee upon examination ([place]) 11/24/2017  . Amputated below knee, left ([place]) 11/22/2017  . Osteomyelitis of left foot ([facility])   . Dehiscence of amputation stump ([place]) 08/11/2017  . S/P transmetatarsal amputation of foot, left ([facility]) 07/19/2017  . Subacute osteomyelitis, left ankle and foot ([place]) 07/17/2017  . Diabetic ulcer of left foot associated with type 2 diabetes mellitus ([place])   . Diabetes mellitus with renal complications ([facility]) 66/12/3014  . Diabetes ([place]) 10/13/2014  . Anemia 10/13/2014  . CKD (chronic kidney disease) stage 3, GFR 30-59 ml/min (HCC) 10/13/2014  . Hyperkalemia 10/13/2014    Willow Ora, PTA, [facility] [address] [place], [place] [address] [phone] 03/27/18, 10:33 PM   Name: Kevin Bradford MRN: [id number] Date of Birth: 1945/11/27

## 2018-03-28 ENCOUNTER — Ambulatory Visit (INDEPENDENT_AMBULATORY_CARE_PROVIDER_SITE_OTHER): Payer: Medicare Other | Admitting: Family

## 2018-03-28 ENCOUNTER — Ambulatory Visit: Payer: Medicare Other | Admitting: Physical Therapy

## 2018-03-28 ENCOUNTER — Encounter (INDEPENDENT_AMBULATORY_CARE_PROVIDER_SITE_OTHER): Payer: Self-pay | Admitting: Family

## 2018-03-28 ENCOUNTER — Encounter: Payer: Self-pay | Admitting: Physical Therapy

## 2018-03-28 VITALS — Ht 74.0 in | Wt 220.0 lb

## 2018-03-28 DIAGNOSIS — S88112S Complete traumatic amputation at level between knee and ankle, left lower leg, sequela: Secondary | ICD-10-CM | POA: Diagnosis not present

## 2018-03-28 DIAGNOSIS — R2681 Unsteadiness on feet: Secondary | ICD-10-CM

## 2018-03-28 DIAGNOSIS — M6281 Muscle weakness (generalized): Secondary | ICD-10-CM

## 2018-03-28 DIAGNOSIS — R2689 Other abnormalities of gait and mobility: Secondary | ICD-10-CM | POA: Diagnosis not present

## 2018-03-28 DIAGNOSIS — R293 Abnormal posture: Secondary | ICD-10-CM

## 2018-03-28 NOTE — Therapy (Signed)
[facility] [address] [place], [place], [address] Phone: [phone]   Fax:  [phone]  Physical Therapy Treatment  Patient Details  Name: Kevin Bradford MRN: [id number] Date of Birth: [birth year] Referring Provider: Meridee Score, MD   Encounter Date: 03/28/2018  PT End of Session - 03/28/18 0931    Visit Number  7    Number of Visits  25    Date for PT Re-Evaluation  05/30/18    Authorization Type  Medicare & AETNA Medicare 80% AETNA covers 20% once $300 deductible (at eval $91.17 met)    PT Start Time  0845    PT Stop Time  0930    PT Time Calculation (min)  45 min    Equipment Utilized During Treatment  Gait belt    Activity Tolerance  Patient tolerated treatment well;No increased pain    Behavior During Therapy  WFL for tasks assessed/performed       Past Medical History:  Diagnosis Date  . Arthritis    "joint pain" (08/16/2017)  . Cellulitis and abscess of leg 10/18/2014  . CKD (chronic kidney disease) stage 3, GFR 30-59 ml/min (HCC)    creatinine increases with antibiotics per pt, no known kidney disease per patient  . Dehiscence of closure of skin, subsequent encounter   . Diabetic foot ulcer ([place])    left  . Diabetic peripheral neuropathy ([place])   . GERD (gastroesophageal reflux disease)    12/26/2017- "not this year."   . Osteomyelitis ([place])    left transmetatarsal   . Type II diabetes mellitus ([place])     Past Surgical History:  Procedure Laterality Date  . AMPUTATION Left 07/19/2017   Procedure: LEFT TRANSMETATARSAL AMPUTATION;  Surgeon: Newt Minion, MD;  Location: [facility];  Service: Orthopedics;  Laterality: Left;  . AMPUTATION Left 08/16/2017   REVISION LEFT TRANSMETATARSAL AMPUTATION  . AMPUTATION Left 11/22/2017   Procedure: LEFT BELOW KNEE AMPUTATION;  Surgeon: Newt Minion, MD;  Location: [facility];  Service: Orthopedics;  Laterality: Left;  . CATARACT EXTRACTION W/ INTRAOCULAR LENS  IMPLANT,  BILATERAL  ~ 2016  . EYE SURGERY Bilateral    catarct  . I&D EXTREMITY Left 10/17/2014   Procedure: IRRIGATION AND DEBRIDEMENT EXTREMITY LEFT FOOT;  Surgeon: Newt Minion, MD;  Location: [facility];  Service: Orthopedics;  Laterality: Left;  . STUMP REVISION Left 08/16/2017   Procedure: REVISION LEFT TRANSMETATARSAL AMPUTATION;  Surgeon: Newt Minion, MD;  Location: [facility];  Service: Orthopedics;  Laterality: Left;  . STUMP REVISION Left 12/27/2017   Procedure: LEFT BELOW KNEE AMPUTATION REVISION;  Surgeon: Newt Minion, MD;  Location: [facility];  Service: Orthopedics;  Laterality: Left;  . TOE AMPUTATION Right 2013   5th toe    There were no vitals filed for this visit.  Subjective Assessment - 03/28/18 0850    Subjective  No falls; did not put tegaderm on wound this time.    Pertinent History  L TTA, DM, CKD3, PVD, arthritis, peripheral neuropathy    Limitations  Lifting;Standing;Walking;House hold activities    Patient Stated Goals  To use prosthesis to be active fish, yard work, play with grandkids, ride bicycle. Swim in pool.     Currently in Pain?  No/denies                       [facility] Adult PT Treatment/Exercise - 03/28/18 0001      Transfers   Transfers  Sit to Stand;Stand to Sit    Sit to Stand  5: Supervision;With upper extremity assist;With armrests;From chair/3-in-1    Stand to Sit  5: Supervision;With upper extremity assist;With armrests;To chair/3-in-1      Ambulation/Gait   Ambulation/Gait  Yes    Ambulation/Gait Assistance  5: Supervision    Ambulation/Gait Assistance Details  working on posture, steplength and step width and weight shifting onto prosthesis during stance    Ambulation Distance (Feet)  600 Feet    Assistive device  Prosthesis;Straight cane    Gait Pattern  Step-through pattern;Decreased arm swing - left;Decreased step length - right;Decreased stance time - left;Decreased stride length;Decreased hip/knee flexion - left;Decreased weight shift  to left;Left circumduction;Left hip hike;Antalgic;Lateral hip instability;Trunk flexed;Abducted - left;Poor foot clearance - left    Ambulation Surface  Outdoor;Paved;Indoor;Level    Stairs  Yes    Stairs Assistance  5: Supervision    Stair Management Technique  One rail Right;Step to pattern;With cane    Number of Stairs  4    Height of Stairs  6    Ramp  5: Supervision    Ramp Details (indicate cue type and reason)  with SPC    Curb  5: Supervision    Curb Details (indicate cue type and reason)  with cane; good sequencing      Prosthetics   Prosthetic Care Comments   wound is open again; applied tegaderm, notified evaluating PT;  called and scheduled appointment for pt to see PA today.                                     Current prosthetic wear tolerance (days/week)   daily    Current prosthetic wear tolerance (#hours/day)   most awake hours, drying every 3-4 hours    Residual limb condition   wound open on lateral aspect of incision 31mx2mm, serous drainage, normal temperature, no odor.                                     Education Provided  Residual limb care;Proper Donning;Proper Doffing;Proper wear schedule/adjustment    Person(s) Educated  Patient;Spouse    Education Method  Explanation    Education Method  Verbalized understanding    Donning Prosthesis  Modified independent (device/increased time)    Doffing Prosthesis  Modified independent (device/increased time)          Balance Exercises - 03/28/18 1337      Balance Exercises: Standing   Standing Eyes Opened  Wide ([place];Other (comment)   picking 5# objects off the floor, with cane; without cane reached > 10"       PT Education - 03/28/18 1332    Education Details  Wound care and recommendation to follow up with Dr. DSharol Given  Discussed goals checked and POC.    Person(s) Educated  Patient    Methods  Explanation    Comprehension  Verbalized understanding       PT Short Term Goals - 03/28/18 1327      PT SHORT  TERM GOAL #1   Title  Patient verbalizes proper cleaning & residual limb care and demonstrates proper donning. (All STGs Target Date: 03/30/2018)    Baseline  Met, 03/28/18.    Time  4    Period  Weeks    Status  Achieved  PT SHORT TERM GOAL #2   Title  Patient tolerates wear of prosthesis >12 hrs total / day without skin issues.     Baseline  Met, 03/28/18.    Time  4    Period  Weeks    Status  Achieved      PT SHORT TERM GOAL #3   Title  Patient ambulates 500' with cane & prosthesis with supervision.     Baseline  Met, 03/28/18.    Time  4    Period  Weeks    Status  Achieved      PT SHORT TERM GOAL #4   Title  Patient negotiates ramps, curbs and stairs with 1 rail with cane & prosthesis with supervision.     Baseline  Met, 03/28/18.    Time  4    Period  Weeks    Status  Achieved      PT SHORT TERM GOAL #5   Title  Patient picks up 5# objects from floor and reaches 10" without balance loss with supervision.     Baseline  Met, 03/28/18.    Time  4    Period  Weeks    Status  Achieved        PT Long Term Goals - 03/07/18 0934      PT LONG TERM GOAL #1   Title  Patient demonstrates & verbalizes understanding of prosthetic care to enable safe use of prosthesis. (All LTGs Target Date: 05/25/2018)    Time  12    Period  Weeks    Status  On-going    Target Date  05/25/18      PT LONG TERM GOAL #2   Title  Patient tolerates wear of prosthesis >90% of awake hours without skin or limb pain issues to enable function throughout his day.     Time  12    Period  Weeks    Status  On-going    Target Date  05/25/18      PT LONG TERM GOAL #3   Title  Patient ambulates >1000' outdoors including grass, ramps & curbs with prosthesis only independently for community mobility.     Time  12    Period  Weeks    Status  On-going    Target Date  05/25/18      PT LONG TERM GOAL #4   Title  Berg Balance >45/56 to indicate lower fall risk.    Time  12    Period  Weeks    Status   On-going    Target Date  05/25/18      PT LONG TERM GOAL #5   Title  Patient perform floor transfers, lifts & carries 25# and push/pull to enable to return to yard work & other patient goals.     Time  12    Period  Weeks    Status  On-going    Target Date  05/25/18            Plan - 03/28/18 1328    Clinical Impression Statement  Pt has demonstrated progressed with knowledge of prosthetic and residual limb care, gait mechanics, and balance meeting all STGs.  Pt continues to have small open wound in incision of residual limb.  Per evaluating PT's recommendation, Dr. Jess Barters office was called and pt was scheduled to see the PA this afternoon to address incision.  Rehab Potential  Good    PT Frequency  2x / week    PT Duration  12 weeks    PT Treatment/Interventions  ADLs/Self Care Home Management;Gait training;Stair training;Functional mobility training;Therapeutic activities;Therapeutic exercise;DME Instruction;Balance training;Neuromuscular re-education;Patient/family education;Prosthetic Training;Vestibular;Manual techniques    PT Next Visit Plan  balance activities working on decreased UE support, gait distance with cane on outdoor surfaces, begin gait with prosthesis only indoors    Consulted and Agree with Plan of Care  Patient;Family member/caregiver    Family Member Consulted  wife, Adron Bene       Patient will benefit from skilled therapeutic intervention in order to improve the following deficits and impairments:  Abnormal gait, Decreased activity tolerance, Decreased balance, Decreased endurance, Decreased knowledge of use of DME, Decreased mobility, Decreased strength, Dizziness, Impaired flexibility, Postural dysfunction, Prosthetic Dependency  Visit Diagnosis: Muscle weakness (generalized)  Other abnormalities of gait and mobility  Unsteadiness on feet  Abnormal posture     Problem  List Patient Active Problem List   Diagnosis Date Noted  . Labile blood glucose   . Acute blood loss anemia   . Labile blood pressure   . Stage 3 chronic kidney disease ([place])   . Type 2 diabetes mellitus with peripheral neuropathy (HCC)   . Post-operative pain   . Amputation of left lower extremity below knee upon examination ([place]) 11/24/2017  . Amputated below knee, left ([place]) 11/22/2017  . Osteomyelitis of left foot ([place])   . Dehiscence of amputation stump ([place]) 08/11/2017  . S/P transmetatarsal amputation of foot, left ([place]) 07/19/2017  . Subacute osteomyelitis, left ankle and foot ([place]) 07/17/2017  . Diabetic ulcer of left foot associated with type 2 diabetes mellitus ([place])   . Diabetes mellitus with renal complications ([place]) 41/28/2081  . Diabetes ([place]) 10/13/2014  . Anemia 10/13/2014  . CKD (chronic kidney disease) stage 3, GFR 30-59 ml/min (HCC) 10/13/2014  . Hyperkalemia 10/13/2014    Bjorn Loser, PTA  03/28/18, 1:45 PM [facility] [address] [place], [place], [address] Phone: [phone]   Fax:  [phone]  Name: Bee Hammerschmidt MRN: [id number] Date of Birth: 07-04-1946

## 2018-03-28 NOTE — Progress Notes (Signed)
Office Visit Note   Patient: Kevin Bradford           Date of Birth: 12-21-1945           MRN: [id number] Visit Date: 03/28/2018              Requested by: Georgann Housekeeper, MD [address] [place], [place] [address] PCP: Georgann Housekeeper, MD  Chief Complaint  Patient presents with  . Left Knee - Routine Post Op      HPI: The patient is a 73 year old gentleman who presents today for concern of recurrent ulceration to his left residual limb.  Has been progressing well with physical therapy.  States he went to [place] for vacation and was able to ambulate with his prosthesis and a cane for the entirety of the trip.  Overall is pleased with his progress.  Does have a small ulceration to lateral residual limb therapist has sent for evaluation.  Assessment & Plan: Visit Diagnoses: No diagnosis found.  Plan: Will leave packing daily and shower as usual use Band-Aid to cover wound until healed.  Follow-up in 2 weeks.  Follow-Up Instructions: No follow-ups on file.   Ortho Exam  Patient is alert, oriented, no adenopathy, well-dressed, normal affect, normal respiratory effort. On examination of the left residual limb this is overall well consolidated well-healed.  There is a 4 mm in diameter ulceration laterally with prior granulation tissue.  There is no drainage.  Was able to probe the wound and remove foreign body this appeared to be secure.  No surrounding erythema no odor no drainage no sign of infection.  Pomegranate packing was placed.  Imaging: No results found. No images are attached to the encounter.  Labs: Lab Results  Component Value Date   HGBA1C 6.7 (H) 11/22/2017   HGBA1C 9.3 (H) 07/19/2017   HGBA1C 11.3 (H) 10/14/2014   ESRSEDRATE 119 (H) 10/18/2014   ESRSEDRATE 115 (H) 10/13/2014   CRP 8.5 (H) 10/18/2014   CRP 21.9 (H) 10/13/2014   LABURIC 3.3 (L) 10/14/2014   REPTSTATUS 07/24/2017 FINAL 07/19/2017   GRAMSTAIN  07/19/2017    FEW WBC PRESENT,  PREDOMINANTLY MONONUCLEAR MODERATE GRAM POSITIVE COCCI IN PAIRS MODERATE GRAM NEGATIVE RODS    CULT  07/19/2017    MODERATE ENTEROCOCCUS FAECALIS FEW METHICILLIN RESISTANT STAPHYLOCOCCUS AUREUS MODERATE BACTEROIDES ORALIS BETA LACTAMASE POSITIVE    LABORGA METHICILLIN RESISTANT STAPHYLOCOCCUS AUREUS 07/19/2017   LABORGA ENTEROCOCCUS FAECALIS 07/19/2017     Lab Results  Component Value Date   ALBUMIN 3.5 11/27/2017   ALBUMIN 3.6 08/16/2017   ALBUMIN 2.4 (L) 10/14/2014   LABURIC 3.3 (L) 10/14/2014    Body mass index is 28.25 kg/m.  Orders:  No orders of the defined types were placed in this encounter.  No orders of the defined types were placed in this encounter.    Procedures: No procedures performed  Clinical Data: No additional findings.  ROS:  All other systems negative, except as noted in the HPI. Review of Systems  Constitutional: Negative for chills and fever.  Skin: Positive for wound. Negative for color change.    Objective: Vital Signs: Ht 6\' 2"  (1.88 m)   Wt 220 lb (99.8 kg)   BMI 28.25 kg/m   Specialty Comments:  No specialty comments available.  PMFS History: Patient Active Problem List   Diagnosis Date Noted  . Labile blood glucose   . Acute blood loss anemia   . Labile blood pressure   . Stage 3 chronic  kidney disease (HCC)   . Type 2 diabetes mellitus with peripheral neuropathy (HCC)   . Post-operative pain   . Amputation of left lower extremity below knee upon examination (HCC) 11/24/2017  . Amputated below knee, left (HCC) 11/22/2017  . Osteomyelitis of left foot (HCC)   . Dehiscence of amputation stump (HCC) 08/11/2017  . S/P transmetatarsal amputation of foot, left (HCC) 07/19/2017  . Subacute osteomyelitis, left ankle and foot (HCC) 07/17/2017  . Diabetic ulcer of left foot associated with type 2 diabetes mellitus (HCC)   . Diabetes mellitus with renal complications (HCC) 10/17/2014  . Diabetes (HCC) 10/13/2014  . Anemia  10/13/2014  . CKD (chronic kidney disease) stage 3, GFR 30-59 ml/min (HCC) 10/13/2014  . Hyperkalemia 10/13/2014   Past Medical History:  Diagnosis Date  . Arthritis    "joint pain" (08/16/2017)  . Cellulitis and abscess of leg 10/18/2014  . CKD (chronic kidney disease) stage 3, GFR 30-59 ml/min (HCC)    creatinine increases with antibiotics per pt, no known kidney disease per patient  . Dehiscence of closure of skin, subsequent encounter   . Diabetic foot ulcer (HCC)    left  . Diabetic peripheral neuropathy (HCC)   . GERD (gastroesophageal reflux disease)    12/26/2017- "not this year."   . Osteomyelitis (HCC)    left transmetatarsal   . Type II diabetes mellitus (HCC)     Family History  Problem Relation Age of Onset  . Dementia Mother     Past Surgical History:  Procedure Laterality Date  . AMPUTATION Left 07/19/2017   Procedure: LEFT TRANSMETATARSAL AMPUTATION;  Surgeon: Nadara Mustard, MD;  [facility];  Service: Orthopedics;  Laterality: Left;  . AMPUTATION Left 08/16/2017   REVISION LEFT TRANSMETATARSAL AMPUTATION  . AMPUTATION Left 11/22/2017   Procedure: LEFT BELOW KNEE AMPUTATION;  Surgeon: Nadara Mustard, MD;  Location: [facility] OR;  Service: Orthopedics;  Laterality: Left;  . CATARACT EXTRACTION W/ INTRAOCULAR LENS  IMPLANT, BILATERAL  ~ 2016  . EYE SURGERY Bilateral    catarct  . I&D EXTREMITY Left 10/17/2014   Procedure: IRRIGATION AND DEBRIDEMENT EXTREMITY LEFT FOOT;  Surgeon: Nadara Mustard, MD;  Location: [facility] OR;  Service: Orthopedics;  Laterality: Left;  . STUMP REVISION Left 08/16/2017   Procedure: REVISION LEFT TRANSMETATARSAL AMPUTATION;  Surgeon: Nadara Mustard, MD;  Location: [facility] OR;  Service: Orthopedics;  Laterality: Left;  . STUMP REVISION Left 12/27/2017   Procedure: LEFT BELOW KNEE AMPUTATION REVISION;  Surgeon: Nadara Mustard, MD;  Location: [facility] OR;  Service: Orthopedics;  Laterality: Left;  . TOE AMPUTATION Right 2013   5th toe   Social History    Occupational History  . Not on file  Tobacco Use  . Smoking status: Never Smoker  . Smokeless tobacco: Never Used  Substance and Sexual Activity  . Alcohol use: Yes    Comment:  "maybe 1 beer 2 times a month"-12/26/2017  . Drug use: No  . Sexual activity: Yes

## 2018-04-02 ENCOUNTER — Encounter: Payer: Self-pay | Admitting: Physical Therapy

## 2018-04-02 ENCOUNTER — Ambulatory Visit: Payer: Medicare Other | Admitting: Physical Therapy

## 2018-04-02 DIAGNOSIS — M6281 Muscle weakness (generalized): Secondary | ICD-10-CM | POA: Diagnosis not present

## 2018-04-02 DIAGNOSIS — R2689 Other abnormalities of gait and mobility: Secondary | ICD-10-CM

## 2018-04-02 DIAGNOSIS — R293 Abnormal posture: Secondary | ICD-10-CM

## 2018-04-02 DIAGNOSIS — R2681 Unsteadiness on feet: Secondary | ICD-10-CM | POA: Diagnosis not present

## 2018-04-02 NOTE — Therapy (Signed)
[facility] [address] [place], [place], [address] Phone: [phone]   Fax:  [phone]  Physical Therapy Treatment  Patient Details  Name: Kevin Bradford MRN: [id number] Date of Birth: 05-Oct-1945 Referring Provider: Meridee Score, MD   Encounter Date: 04/02/2018  PT End of Session - 04/02/18 0936    Visit Number  8    Number of Visits  25    Date for PT Re-Evaluation  05/30/18    Authorization Type  Medicare & AETNA Medicare 80% AETNA covers 20% once $300 deductible (at eval $91.17 met)    PT Start Time  0843    PT Stop Time  0932    PT Time Calculation (min)  49 min    Equipment Utilized During Treatment  Gait belt    Activity Tolerance  Patient tolerated treatment well;No increased pain    Behavior During Therapy  WFL for tasks assessed/performed       Past Medical History:  Diagnosis Date  . Arthritis    "joint pain" (08/16/2017)  . Cellulitis and abscess of leg 10/18/2014  . CKD (chronic kidney disease) stage 3, GFR 30-59 ml/min (HCC)    creatinine increases with antibiotics per pt, no known kidney disease per patient  . Dehiscence of closure of skin, subsequent encounter   . Diabetic foot ulcer ([place])    left  . Diabetic peripheral neuropathy ([place])   . GERD (gastroesophageal reflux disease)    12/26/2017- "not this year."   . Osteomyelitis ([place])    left transmetatarsal   . Type II diabetes mellitus ([place])     Past Surgical History:  Procedure Laterality Date  . AMPUTATION Left 07/19/2017   Procedure: LEFT TRANSMETATARSAL AMPUTATION;  Surgeon: Newt Minion, MD;  Location: [facility];  Service: Orthopedics;  Laterality: Left;  . AMPUTATION Left 08/16/2017   REVISION LEFT TRANSMETATARSAL AMPUTATION  . AMPUTATION Left 11/22/2017   Procedure: LEFT BELOW KNEE AMPUTATION;  Surgeon: Newt Minion, MD;  Location: [facility];  Service: Orthopedics;  Laterality: Left;  . CATARACT EXTRACTION W/ INTRAOCULAR LENS  IMPLANT,  BILATERAL  ~ 2016  . EYE SURGERY Bilateral    catarct  . I&D EXTREMITY Left 10/17/2014   Procedure: IRRIGATION AND DEBRIDEMENT EXTREMITY LEFT FOOT;  Surgeon: Newt Minion, MD;  Location: [facility];  Service: Orthopedics;  Laterality: Left;  . STUMP REVISION Left 08/16/2017   Procedure: REVISION LEFT TRANSMETATARSAL AMPUTATION;  Surgeon: Newt Minion, MD;  Location: [facility];  Service: Orthopedics;  Laterality: Left;  . STUMP REVISION Left 12/27/2017   Procedure: LEFT BELOW KNEE AMPUTATION REVISION;  Surgeon: Newt Minion, MD;  Location: [facility];  Service: Orthopedics;  Laterality: Left;  . TOE AMPUTATION Right 2013   5th toe    There were no vitals filed for this visit.  Subjective Assessment - 04/02/18 0845    Subjective  He saw Junie Panning PA at [facility] who removed suture from limb wound last week. He took easy for 2 days as advised. Wearing prosthesis all awake hours except break midday.     Pertinent History  L TTA, DM, CKD3, PVD, arthritis, peripheral neuropathy    Limitations  Lifting;Standing;Walking;House hold activities    Patient Stated Goals  To use prosthesis to be active fish, yard work, play with grandkids, ride bicycle. Swim in pool.     Currently in Pain?  No/denies  [facility] Adult PT Treatment/Exercise - 04/02/18 0845      Transfers   Transfers  Sit to Stand;Stand to Sit;Floor to Transfer    Sit to Stand  6: Modified independent (Device/Increase time);With upper extremity assist;From chair/3-in-1   chairs without armrests   Stand to Sit  6: Modified independent (Device/Increase time);With upper extremity assist;To chair/3-in-1   chairs without armrests   Floor to Transfer  5: Supervision;With upper extremity assist;With armrests;From chair/3-in-1   pushing with UEs on chair bottom   Floor to Transfer Details (indicate cue type and reason)  PT demo use of Garden Kneel bench to improve knee ROM needed & decrease risk of putting hole in suction sleeve  of prosthesis.      Ambulation/Gait   Ambulation/Gait  Yes    Ambulation/Gait Assistance  5: Supervision    Ambulation/Gait Assistance Details  verbal cues on step width & posture    Ambulation Distance (Feet)  600 Feet    Assistive device  Prosthesis;None    Gait Pattern  Step-through pattern;Decreased arm swing - left;Decreased step length - right;Decreased stance time - left;Decreased stride length;Decreased hip/knee flexion - left;Decreased weight shift to left;Left circumduction;Left hip hike;Antalgic;Lateral hip instability;Trunk flexed;Abducted - left;Poor foot clearance - left    Ambulation Surface  Indoor;Level;Outdoor;Paved    Stairs  Yes    Stairs Assistance  5: Supervision    Stairs Assistance Details (indicate cue type and reason)  cues on alternating pattern    Stair Management Technique  Alternating pattern;Two rails;Forwards    Number of Stairs  4   3 reps   Height of Stairs  6    Ramp  5: Supervision   prosthesis only   Ramp Details (indicate cue type and reason)  cues on posture & wt shift    Curb  5: Supervision   prosthesis only   Curb Details (indicate cue type and reason)  cues on technique with TTA prsothesis      High Level Balance   High Level Balance Activities  Figure 8 turns;Negotiating over obstacles   TTA prosthesis   High Level Balance Comments  demo & verbal cues on technique with TTA prosthesis      Therapeutic Activites    Therapeutic Activities  Lifting;Work Economist;Other Therapeutic Activities    Lifting  PT demo, instructed technique with crate/box with 20# & carrying. Pt return demo with verbal cues & carried 81'     Work Radiation protection practitioner, instructed in climbing A-frame ladder with TTA prosthesis.  Pt return demo understanding with min guard.       Prosthetics   Prosthetic Care Comments   PT demo, instructed in cycling with TTA prosthesis. Pt performed LE ergometer sitting on 24" stool with good control of prosthesis. PT recommended  trying stationary bike with his clip shoes first.     Current prosthetic wear tolerance (days/week)   daily    Current prosthetic wear tolerance (#hours/day)   most awake hours, drying every 3-4 hours    Residual limb condition   no open areas now. No drainage.     Education Provided  Residual limb care;Proper Donning;Proper Doffing;Proper wear schedule/adjustment    Person(s) Educated  Patient    Education Method  Explanation;Verbal cues;Demonstration;Tactile cues    Education Method  Verbalized understanding;Returned demonstration;Tactile cues required;Verbal cues required;Needs further instruction             PT Education - 04/02/18 0930    Education Details  HEP tennis ball rolls  BLEs with ipsilateral UE support and standing on foam EO feet together & EC feet apart head turns 4 directions.     Person(s) Educated  Patient    Methods  Explanation;Demonstration;Tactile cues;Handout;Verbal cues    Comprehension  Verbalized understanding;Returned demonstration       PT Short Term Goals - 03/28/18 1327      PT SHORT TERM GOAL #1   Title  Patient verbalizes proper cleaning & residual limb care and demonstrates proper donning. (All STGs Target Date: 03/30/2018)    Baseline  Met, 03/28/18.    Time  4    Period  Weeks    Status  Achieved      PT SHORT TERM GOAL #2   Title  Patient tolerates wear of prosthesis >12 hrs total / day without skin issues.     Baseline  Met, 03/28/18.    Time  4    Period  Weeks    Status  Achieved      PT SHORT TERM GOAL #3   Title  Patient ambulates 500' with cane & prosthesis with supervision.     Baseline  Met, 03/28/18.    Time  4    Period  Weeks    Status  Achieved      PT SHORT TERM GOAL #4   Title  Patient negotiates ramps, curbs and stairs with 1 rail with cane & prosthesis with supervision.     Baseline  Met, 03/28/18.    Time  4    Period  Weeks    Status  Achieved      PT SHORT TERM GOAL #5   Title  Patient picks up 5# objects from  floor and reaches 10" without balance loss with supervision.     Baseline  Met, 03/28/18.    Time  4    Period  Weeks    Status  Achieved        PT Long Term Goals - 03/07/18 0934      PT LONG TERM GOAL #1   Title  Patient demonstrates & verbalizes understanding of prosthetic care to enable safe use of prosthesis. (All LTGs Target Date: 05/25/2018)    Time  12    Period  Weeks    Status  On-going    Target Date  05/25/18      PT LONG TERM GOAL #2   Title  Patient tolerates wear of prosthesis >90% of awake hours without skin or limb pain issues to enable function throughout his day.     Time  12    Period  Weeks    Status  On-going    Target Date  05/25/18      PT LONG TERM GOAL #3   Title  Patient ambulates >1000' outdoors including grass, ramps & curbs with prosthesis only independently for community mobility.     Time  12    Period  Weeks    Status  On-going    Target Date  05/25/18      PT LONG TERM GOAL #4   Title  Berg Balance >45/56 to indicate lower fall risk.    Time  12    Period  Weeks    Status  On-going    Target Date  05/25/18      PT LONG TERM GOAL #5   Title  Patient perform floor transfers, lifts & carries 25# and push/pull to enable to return to yard work & other patient goals.  Time  12    Period  Weeks    Status  On-going    Target Date  05/25/18            Plan - 04/02/18 1219    Clinical Impression Statement  Patient's residual limb wound appears to be healing since PA removed suture last week. He has general understanding of floor transfers, lifting/carrying, stepping over obstacles & climbing A-frame ladder but additional skilled instruction for safety.     Rehab Potential  Good    PT Frequency  2x / week    PT Duration  12 weeks    PT Treatment/Interventions  ADLs/Self Care Home Management;Gait training;Stair training;Functional mobility training;Therapeutic activities;Therapeutic exercise;DME Instruction;Balance  training;Neuromuscular re-education;Patient/family education;Prosthetic Training;Vestibular;Manual techniques    PT Next Visit Plan  work towards [place] with anticipation that he will meet in next 30 days.     Consulted and Agree with Plan of Care  Patient       Patient will benefit from skilled therapeutic intervention in order to improve the following deficits and impairments:  Abnormal gait, Decreased activity tolerance, Decreased balance, Decreased endurance, Decreased knowledge of use of DME, Decreased mobility, Decreased strength, Dizziness, Impaired flexibility, Postural dysfunction, Prosthetic Dependency  Visit Diagnosis: Muscle weakness (generalized)  Other abnormalities of gait and mobility  Unsteadiness on feet  Abnormal posture     Problem List Patient Active Problem List   Diagnosis Date Noted  . Labile blood glucose   . Acute blood loss anemia   . Labile blood pressure   . Stage 3 chronic kidney disease ([place])   . Type 2 diabetes mellitus with peripheral neuropathy (HCC)   . Post-operative pain   . Amputation of left lower extremity below knee upon examination ([place]) 11/24/2017  . Amputated below knee, left ([place]) 11/22/2017  . Osteomyelitis of left foot ([place])   . Dehiscence of amputation stump ([place]) 08/11/2017  . S/P transmetatarsal amputation of foot, left ([place]) 07/19/2017  . Subacute osteomyelitis, left ankle and foot ([place]) 07/17/2017  . Diabetic ulcer of left foot associated with type 2 diabetes mellitus ([place])   . Diabetes mellitus with renal complications ([place]) 58/68/2574  . Diabetes ([place]) 10/13/2014  . Anemia 10/13/2014  . CKD (chronic kidney disease) stage 3, GFR 30-59 ml/min (HCC) 10/13/2014  . Hyperkalemia 10/13/2014    Satoru Milich PT, DPT 04/02/2018, 12:21 PM  [facility] [address] [place], [place], [address] Phone: [phone]   Fax:  [phone]  Name: Lendon Agustin MRN:  [id number] Date of Birth: Dec 05, 1945

## 2018-04-04 ENCOUNTER — Ambulatory Visit: Payer: Medicare Other | Admitting: Physical Therapy

## 2018-04-04 ENCOUNTER — Encounter: Payer: Self-pay | Admitting: Physical Therapy

## 2018-04-04 DIAGNOSIS — R2681 Unsteadiness on feet: Secondary | ICD-10-CM

## 2018-04-04 DIAGNOSIS — M6281 Muscle weakness (generalized): Secondary | ICD-10-CM

## 2018-04-04 DIAGNOSIS — R2689 Other abnormalities of gait and mobility: Secondary | ICD-10-CM | POA: Diagnosis not present

## 2018-04-04 DIAGNOSIS — R293 Abnormal posture: Secondary | ICD-10-CM

## 2018-04-04 NOTE — Therapy (Signed)
[facility] [address] [place], [place], [address] Phone: [phone]   Fax:  [phone]  Physical Therapy Treatment  Patient Details  Name: Kevin Bradford MRN: [id number] Date of Birth: [birth year] Referring Provider: Meridee Score, MD   Encounter Date: 04/04/2018  PT End of Session - 04/04/18 0927    Visit Number  9    Number of Visits  25    Date for PT Re-Evaluation  05/30/18    Authorization Type  Medicare & AETNA Medicare 80% AETNA covers 20% once $300 deductible (at eval $91.17 met)    PT Start Time  0847    PT Stop Time  0927    PT Time Calculation (min)  40 min    Equipment Utilized During Treatment  Gait belt    Activity Tolerance  Patient tolerated treatment well;No increased pain    Behavior During Therapy  WFL for tasks assessed/performed       Past Medical History:  Diagnosis Date  . Arthritis    "joint pain" (08/16/2017)  . Cellulitis and abscess of leg 10/18/2014  . CKD (chronic kidney disease) stage 3, GFR 30-59 ml/min (HCC)    creatinine increases with antibiotics per pt, no known kidney disease per patient  . Dehiscence of closure of skin, subsequent encounter   . Diabetic foot ulcer ([place])    left  . Diabetic peripheral neuropathy ([place])   . GERD (gastroesophageal reflux disease)    12/26/2017- "not this year."   . Osteomyelitis ([place])    left transmetatarsal   . Type II diabetes mellitus ([place])     Past Surgical History:  Procedure Laterality Date  . AMPUTATION Left 07/19/2017   Procedure: LEFT TRANSMETATARSAL AMPUTATION;  Surgeon: Newt Minion, MD;  Location: [facility];  Service: Orthopedics;  Laterality: Left;  . AMPUTATION Left 08/16/2017   REVISION LEFT TRANSMETATARSAL AMPUTATION  . AMPUTATION Left 11/22/2017   Procedure: LEFT BELOW KNEE AMPUTATION;  Surgeon: Newt Minion, MD;  Location: [facility];  Service: Orthopedics;  Laterality: Left;  . CATARACT EXTRACTION W/ INTRAOCULAR LENS  IMPLANT,  BILATERAL  ~ 2016  . EYE SURGERY Bilateral    catarct  . I&D EXTREMITY Left 10/17/2014   Procedure: IRRIGATION AND DEBRIDEMENT EXTREMITY LEFT FOOT;  Surgeon: Newt Minion, MD;  Location: [facility];  Service: Orthopedics;  Laterality: Left;  . STUMP REVISION Left 08/16/2017   Procedure: REVISION LEFT TRANSMETATARSAL AMPUTATION;  Surgeon: Newt Minion, MD;  Location: [facility];  Service: Orthopedics;  Laterality: Left;  . STUMP REVISION Left 12/27/2017   Procedure: LEFT BELOW KNEE AMPUTATION REVISION;  Surgeon: Newt Minion, MD;  Location: [facility];  Service: Orthopedics;  Laterality: Left;  . TOE AMPUTATION Right 2013   5th toe    There were no vitals filed for this visit.  Subjective Assessment - 04/04/18 0849    Subjective  Pt able to work on updated HEP for balance.  Pt bought a stationary bike.    Pertinent History  L TTA, DM, CKD3, PVD, arthritis, peripheral neuropathy    Limitations  Lifting;Standing;Walking;House hold activities    Patient Stated Goals  To use prosthesis to be active fish, yard work, play with grandkids, ride bicycle. Swim in pool.                        [facility] Adult PT Treatment/Exercise - 04/04/18 0001      Ambulation/Gait   Ambulation/Gait  Yes  Ambulation/Gait Assistance  5: Supervision    Ambulation/Gait Assistance Details  dynamic gait with visual scanning, turning, and stepping over obstacle tasks.    Ambulation Distance (Feet)  300 Feet    Assistive device  Prosthesis;None;Straight cane   SPC when stepping over  barriers   Gait Pattern  Step-through pattern;Decreased arm swing - left;Decreased step length - right;Decreased stance time - left;Decreased stride length;Decreased hip/knee flexion - left;Decreased weight shift to left;Left circumduction;Left hip hike;Antalgic;Lateral hip instability;Trunk flexed;Abducted - left;Poor foot clearance - left    Ambulation Surface  Level;Indoor    Stairs  Yes    Stairs Assistance  5: Supervision    Stair  Management Technique  Alternating pattern;Two rails;Forwards;One rail Left   progress to 1 rail with alternating pattern   Number of Stairs  4   x4   Height of Stairs  6    Ramp  5: Supervision    Ramp Details (indicate cue type and reason)  cues for step length      Exercises   Exercises  Knee/Hip      Knee/Hip Exercises: Aerobic   Stepper  Sci fit Level up to 2.5, 10 min, all extremities.      Prosthetics   Prosthetic Care Comments   Instructed pt when riding stationary bike to be aware of sweating and drying residual limb appropriately for skin care.                                   Current prosthetic wear tolerance (days/week)   daily    Current prosthetic wear tolerance (#hours/day)   most awake hours, drying every 3-4 hours    Residual limb condition   Per pt, no open areas now. No drainage.     Education Provided  Residual limb care;Proper Donning;Proper Doffing;Proper wear schedule/adjustment    Person(s) Educated  Patient    Education Method  Explanation;Demonstration    Education Method  Verbalized understanding          Balance Exercises - 04/04/18 [phone]      Balance Exercises: Standing   SLS with Vectors  Solid surface;Intermittent upper extremity assist;4 reps;30 secs   alternate foot on ball rolling in all directions   Turning  Both;Other (comment)   figure 8 turns progressing with visual scanning tasks, no AD   Step Over Hurdles / Cones  with cane forward then sidestepping        PT Education - 04/04/18 0927    Education Details  Benefit and importance of working on stepping strategies and righting reactions.    Person(s) Educated  Patient    Methods  Explanation;Demonstration    Comprehension  Verbalized understanding       PT Short Term Goals - 03/28/18 1327      PT SHORT TERM GOAL #1   Title  Patient verbalizes proper cleaning & residual limb care and demonstrates proper donning. (All STGs Target Date: 03/30/2018)    Baseline  Met, 03/28/18.     Time  4    Period  Weeks    Status  Achieved      PT SHORT TERM GOAL #2   Title  Patient tolerates wear of prosthesis >12 hrs total / day without skin issues.     Baseline  Met, 03/28/18.    Time  4    Period  Weeks    Status  Achieved  PT SHORT TERM GOAL #3   Title  Patient ambulates 500' with cane & prosthesis with supervision.     Baseline  Met, 03/28/18.    Time  4    Period  Weeks    Status  Achieved      PT SHORT TERM GOAL #4   Title  Patient negotiates ramps, curbs and stairs with 1 rail with cane & prosthesis with supervision.     Baseline  Met, 03/28/18.    Time  4    Period  Weeks    Status  Achieved      PT SHORT TERM GOAL #5   Title  Patient picks up 5# objects from floor and reaches 10" without balance loss with supervision.     Baseline  Met, 03/28/18.    Time  4    Period  Weeks    Status  Achieved        PT Long Term Goals - 03/07/18 0934      PT LONG TERM GOAL #1   Title  Patient demonstrates & verbalizes understanding of prosthetic care to enable safe use of prosthesis. (All LTGs Target Date: 05/25/2018)    Time  12    Period  Weeks    Status  On-going    Target Date  05/25/18      PT LONG TERM GOAL #2   Title  Patient tolerates wear of prosthesis >90% of awake hours without skin or limb pain issues to enable function throughout his day.     Time  12    Period  Weeks    Status  On-going    Target Date  05/25/18      PT LONG TERM GOAL #3   Title  Patient ambulates >1000' outdoors including grass, ramps & curbs with prosthesis only independently for community mobility.     Time  12    Period  Weeks    Status  On-going    Target Date  05/25/18      PT LONG TERM GOAL #4   Title  Berg Balance >45/56 to indicate lower fall risk.    Time  12    Period  Weeks    Status  On-going    Target Date  05/25/18      PT LONG TERM GOAL #5   Title  Patient perform floor transfers, lifts & carries 25# and push/pull to enable to return to yard work &  other patient goals.     Time  12    Period  Weeks    Status  On-going    Target Date  05/25/18            Plan - 04/04/18 1340    Clinical Impression Statement  Pt is progressing with balance during gait without AD. Pt performed visual scanning tasks, community barrier negotiation, and turning without an AD at supervision level.  Worked in SLS standing with foot on ball; pt requires 1 to intermittent hand support.                                      Rehab Potential  Good    PT Frequency  2x / week    PT Duration  12 weeks    PT Treatment/Interventions  ADLs/Self Care Home Management;Gait training;Stair training;Functional mobility training;Therapeutic activities;Therapeutic exercise;DME Instruction;Balance training;Neuromuscular re-education;Patient/family education;Prosthetic Training;Vestibular;Manual techniques    PT Next  Visit Plan  work towards [place] with anticipation that he will meet in next 30 days.     Consulted and Agree with Plan of Care  Patient       Patient will benefit from skilled therapeutic intervention in order to improve the following deficits and impairments:  Abnormal gait, Decreased activity tolerance, Decreased balance, Decreased endurance, Decreased knowledge of use of DME, Decreased mobility, Decreased strength, Dizziness, Impaired flexibility, Postural dysfunction, Prosthetic Dependency  Visit Diagnosis: Muscle weakness (generalized)  Other abnormalities of gait and mobility  Unsteadiness on feet  Abnormal posture     Problem List Patient Active Problem List   Diagnosis Date Noted  . Labile blood glucose   . Acute blood loss anemia   . Labile blood pressure   . Stage 3 chronic kidney disease ([place])   . Type 2 diabetes mellitus with peripheral neuropathy (HCC)   . Post-operative pain   . Amputation of left lower extremity below knee upon examination ([place]) 11/24/2017  . Amputated below knee, left ([place]) 11/22/2017  . Osteomyelitis of left foot  ([place])   . Dehiscence of amputation stump ([place]) 08/11/2017  . S/P transmetatarsal amputation of foot, left ([place]) 07/19/2017  . Subacute osteomyelitis, left ankle and foot ([place]) 07/17/2017  . Diabetic ulcer of left foot associated with type 2 diabetes mellitus ([place])   . Diabetes mellitus with renal complications ([place]) 53/96/7289  . Diabetes ([place]) 10/13/2014  . Anemia 10/13/2014  . CKD (chronic kidney disease) stage 3, GFR 30-59 ml/min (HCC) 10/13/2014  . Hyperkalemia 10/13/2014    Bjorn Loser, PTA  04/04/18, 1:46 PM [facility] [address] [place], [place], [address] Phone: [phone]   Fax:  [phone]  Name: Marlyn Rabine MRN: [id number] Date of Birth: Dec 02, 1945

## 2018-04-09 ENCOUNTER — Encounter: Payer: Self-pay | Admitting: Physical Therapy

## 2018-04-09 ENCOUNTER — Ambulatory Visit: Payer: Medicare Other | Admitting: Physical Therapy

## 2018-04-09 DIAGNOSIS — R293 Abnormal posture: Secondary | ICD-10-CM | POA: Diagnosis not present

## 2018-04-09 DIAGNOSIS — M6281 Muscle weakness (generalized): Secondary | ICD-10-CM

## 2018-04-09 DIAGNOSIS — R2689 Other abnormalities of gait and mobility: Secondary | ICD-10-CM | POA: Diagnosis not present

## 2018-04-09 DIAGNOSIS — R2681 Unsteadiness on feet: Secondary | ICD-10-CM

## 2018-04-10 NOTE — Therapy (Addendum)
[facility] [address] [place], [place], [address] Phone: [phone]   Fax:  [phone]  Progress Note Reporting Period 03/01/2018 to 04/09/2018  See note below for Objective Data and Assessment of Progress/Goals.    Physical Therapy Treatment  Patient Details  Name: Kevin Bradford MRN: [id number] Date of Birth: 1946-05-15 Referring Provider: Meridee Score, MD   Encounter Date: 04/09/2018  PT End of Session - 04/09/18 0852    Visit Number  10    Number of Visits  25    Date for PT Re-Evaluation  05/30/18    Authorization Type  Medicare & AETNA Medicare 80% AETNA covers 20% once $300 deductible (at eval $91.17 met)    PT Start Time  0849    PT Stop Time  0928    PT Time Calculation (min)  39 min    Equipment Utilized During Treatment  Gait belt    Activity Tolerance  Patient tolerated treatment well;No increased pain    Behavior During Therapy  WFL for tasks assessed/performed       Past Medical History:  Diagnosis Date  . Arthritis    "joint pain" (08/16/2017)  . Cellulitis and abscess of leg 10/18/2014  . CKD (chronic kidney disease) stage 3, GFR 30-59 ml/min (HCC)    creatinine increases with antibiotics per pt, no known kidney disease per patient  . Dehiscence of closure of skin, subsequent encounter   . Diabetic foot ulcer ([place])    left  . Diabetic peripheral neuropathy ([place])   . GERD (gastroesophageal reflux disease)    12/26/2017- "not this year."   . Osteomyelitis ([place])    left transmetatarsal   . Type II diabetes mellitus ([place])     Past Surgical History:  Procedure Laterality Date  . AMPUTATION Left 07/19/2017   Procedure: LEFT TRANSMETATARSAL AMPUTATION;  Surgeon: Newt Minion, MD;  Location: [facility];  Service: Orthopedics;  Laterality: Left;  . AMPUTATION Left 08/16/2017   REVISION LEFT TRANSMETATARSAL AMPUTATION  . AMPUTATION Left 11/22/2017   Procedure: LEFT BELOW KNEE AMPUTATION;  Surgeon:  Newt Minion, MD;  Location: [facility];  Service: Orthopedics;  Laterality: Left;  . CATARACT EXTRACTION W/ INTRAOCULAR LENS  IMPLANT, BILATERAL  ~ 2016  . EYE SURGERY Bilateral    catarct  . I&D EXTREMITY Left 10/17/2014   Procedure: IRRIGATION AND DEBRIDEMENT EXTREMITY LEFT FOOT;  Surgeon: Newt Minion, MD;  Location: [facility];  Service: Orthopedics;  Laterality: Left;  . STUMP REVISION Left 08/16/2017   Procedure: REVISION LEFT TRANSMETATARSAL AMPUTATION;  Surgeon: Newt Minion, MD;  Location: [facility];  Service: Orthopedics;  Laterality: Left;  . STUMP REVISION Left 12/27/2017   Procedure: LEFT BELOW KNEE AMPUTATION REVISION;  Surgeon: Newt Minion, MD;  Location: [facility];  Service: Orthopedics;  Laterality: Left;  . TOE AMPUTATION Right 2013   5th toe    There were no vitals filed for this visit.  Subjective Assessment - 04/09/18 0851    Subjective  No new complaints. Did use bike without issues. Also walk about quarter of a mile over grass to attend grandkids soccer game. No falls or pain to report.     Pertinent History  L TTA, DM, CKD3, PVD, arthritis, peripheral neuropathy    Limitations  Lifting;Standing;Walking;House hold activities    Patient Stated Goals  To use prosthesis to be active fish, yard work, play with grandkids, ride bicycle. Swim in pool.     Currently in Pain?  No/denies            [facility] Adult PT Treatment/Exercise - 04/09/18 0853      Transfers   Transfers  Sit to Stand;Stand to Sit    Sit to Stand  6: Modified independent (Device/Increase time);With upper extremity assist;From chair/3-in-1    Stand to Sit  6: Modified independent (Device/Increase time);With upper extremity assist;To chair/3-in-1      Ambulation/Gait   Ambulation/Gait  Yes    Ambulation/Gait Assistance  5: Supervision    Ambulation/Gait Assistance Details  had pt engage in enviromental scanning with no balance loss, does veer at times.     Ambulation Distance (Feet)  500 Feet   x1, 110 x2,  plus around gym with activities   Assistive device  Prosthesis;None;Straight cane    Gait Pattern  Step-through pattern;Decreased arm swing - left;Decreased step length - right;Decreased stance time - left;Decreased stride length;Decreased hip/knee flexion - left;Decreased weight shift to left;Left circumduction;Left hip hike;Antalgic;Lateral hip instability;Trunk flexed;Abducted - left;Poor foot clearance - left    Ambulation Surface  Level;Indoor      Neuro Re-ed    Neuro Re-ed Details   for balance/muscle re-ed: gait along ~50 foot hallway with head movements left<>fwd<>right, then up<>fwd<>down x 2 laps each with min guard to supervision assist. minor veering noted, no change in gait speed.       Prosthetics   Current prosthetic wear tolerance (days/week)   daily    Current prosthetic wear tolerance (#hours/day)   most awake hours, drying every 3-4 hours    Residual limb condition   intact per pt report          Balance Exercises - 04/09/18 0909      Balance Exercises: Standing   Standing Eyes Opened  Narrow base of support (BOS);Wide (BOA);Head turns;Foam/compliant surface;Other reps (comment);30 secs;Limitations    Balance Beam  standing across blue foam beam: alternating fwd stepping to floor/back onto beam, then altenrating bwd stepping to floor/back onto beam x 10 reps each with light UE support progressing to no UE support as reps progressed. min guard assist with cues on increased step length/step height and on weight shifting.       Balance Exercises: Standing   Standing Eyes Opened Limitations  airex in corner wtih a chair in front for safety: wide base of support progressing to narrow base of support for EC no head movements, progressing to EC head movements left<>right, then up<>down. min guard assist with pt occasionally touching chair/wall for balance assistance.            PT Short Term Goals - 03/28/18 1327      PT SHORT TERM GOAL #1   Title  Patient verbalizes  proper cleaning & residual limb care and demonstrates proper donning. (All STGs Target Date: 03/30/2018)    Baseline  Met, 03/28/18.    Time  4    Period  Weeks    Status  Achieved      PT SHORT TERM GOAL #2   Title  Patient tolerates wear of prosthesis >12 hrs total / day without skin issues.     Baseline  Met, 03/28/18.    Time  4    Period  Weeks    Status  Achieved      PT SHORT TERM GOAL #3   Title  Patient ambulates 500' with cane & prosthesis with supervision.     Baseline  Met, 03/28/18.    Time  4    Period  Weeks    Status  Achieved      PT SHORT TERM GOAL #4   Title  Patient negotiates ramps, curbs and stairs with 1 rail with cane & prosthesis with supervision.     Baseline  Met, 03/28/18.    Time  4    Period  Weeks    Status  Achieved      PT SHORT TERM GOAL #5   Title  Patient picks up 5# objects from floor and reaches 10" without balance loss with supervision.     Baseline  Met, 03/28/18.    Time  4    Period  Weeks    Status  Achieved        PT Long Term Goals - 03/07/18 0934      PT LONG TERM GOAL #1   Title  Patient demonstrates & verbalizes understanding of prosthetic care to enable safe use of prosthesis. (All LTGs Target Date: 05/25/2018)    Time  12    Period  Weeks    Status  On-going    Target Date  05/25/18      PT LONG TERM GOAL #2   Title  Patient tolerates wear of prosthesis >90% of awake hours without skin or limb pain issues to enable function throughout his day.     Time  12    Period  Weeks    Status  On-going    Target Date  05/25/18      PT LONG TERM GOAL #3   Title  Patient ambulates >1000' outdoors including grass, ramps & curbs with prosthesis only independently for community mobility.     Time  12    Period  Weeks    Status  On-going    Target Date  05/25/18      PT LONG TERM GOAL #4   Title  Berg Balance >45/56 to indicate lower fall risk.    Time  12    Period  Weeks    Status  On-going    Target Date  05/25/18       PT LONG TERM GOAL #5   Title  Patient perform floor transfers, lifts & carries 25# and push/pull to enable to return to yard work & other patient goals.     Time  12    Period  Weeks    Status  On-going    Target Date  05/25/18            Plan - 04/09/18 0852    Clinical Impression Statement  Today's skilled session continued to focus on gait without AD on various surfaces, dynamic gait and balance reactions. No issues reported by patient, The pt is progressing toward goals very well and may meet goals in next few visits.      Rehab Potential  Good    PT Frequency  2x / week    PT Duration  12 weeks    PT Treatment/Interventions  ADLs/Self Care Home Management;Gait training;Stair training;Functional mobility training;Therapeutic activities;Therapeutic exercise;DME Instruction;Balance training;Neuromuscular re-education;Patient/family education;Prosthetic Training;Vestibular;Manual techniques    PT Next Visit Plan   continue to work toward LTGs with anticipation he will meet them before end of plan of care    Consulted and Agree with Plan of Care  Patient       Patient will benefit from skilled therapeutic intervention in order to improve the following deficits and impairments:  Abnormal gait, Decreased activity tolerance, Decreased balance, Decreased endurance, Decreased knowledge of use of DME,  Decreased mobility, Decreased strength, Dizziness, Impaired flexibility, Postural dysfunction, Prosthetic Dependency  Visit Diagnosis: Muscle weakness (generalized)  Other abnormalities of gait and mobility  Unsteadiness on feet     Problem List Patient Active Problem List   Diagnosis Date Noted  . Labile blood glucose   . Acute blood loss anemia   . Labile blood pressure   . Stage 3 chronic kidney disease ([place])   . Type 2 diabetes mellitus with peripheral neuropathy (HCC)   . Post-operative pain   . Amputation of left lower extremity below knee upon examination ([place]) 11/24/2017   . Amputated below knee, left ([place]) 11/22/2017  . Osteomyelitis of left foot ([place])   . Dehiscence of amputation stump ([place]) 08/11/2017  . S/P transmetatarsal amputation of foot, left ([place]) 07/19/2017  . Subacute osteomyelitis, left ankle and foot ([place]) 07/17/2017  . Diabetic ulcer of left foot associated with type 2 diabetes mellitus ([place])   . Diabetes mellitus with renal complications ([place]) 84/09/7541  . Diabetes ([place]) 10/13/2014  . Anemia 10/13/2014  . CKD (chronic kidney disease) stage 3, GFR 30-59 ml/min (HCC) 10/13/2014  . Hyperkalemia 10/13/2014    Willow Ora, PTA, [facility] Outpatient Neuro [facility] [address], [place], [place] [address] [phone] 04/10/18, 3:10 PM   Name: Tamas Suen MRN: [id number] Date of Birth: 1946-05-29

## 2018-04-11 ENCOUNTER — Ambulatory Visit: Payer: Medicare Other | Admitting: Physical Therapy

## 2018-04-11 ENCOUNTER — Ambulatory Visit (INDEPENDENT_AMBULATORY_CARE_PROVIDER_SITE_OTHER): Payer: Medicare Other | Admitting: Physician Assistant

## 2018-04-11 ENCOUNTER — Encounter: Payer: Self-pay | Admitting: Physical Therapy

## 2018-04-11 ENCOUNTER — Encounter (INDEPENDENT_AMBULATORY_CARE_PROVIDER_SITE_OTHER): Payer: Self-pay | Admitting: Orthopedic Surgery

## 2018-04-11 VITALS — Ht 74.0 in | Wt 220.0 lb

## 2018-04-11 DIAGNOSIS — N183 Chronic kidney disease, stage 3 unspecified: Secondary | ICD-10-CM

## 2018-04-11 DIAGNOSIS — R293 Abnormal posture: Secondary | ICD-10-CM

## 2018-04-11 DIAGNOSIS — E1142 Type 2 diabetes mellitus with diabetic polyneuropathy: Secondary | ICD-10-CM

## 2018-04-11 DIAGNOSIS — R2681 Unsteadiness on feet: Secondary | ICD-10-CM

## 2018-04-11 DIAGNOSIS — M6281 Muscle weakness (generalized): Secondary | ICD-10-CM

## 2018-04-11 DIAGNOSIS — R2689 Other abnormalities of gait and mobility: Secondary | ICD-10-CM

## 2018-04-11 DIAGNOSIS — Z89512 Acquired absence of left leg below knee: Secondary | ICD-10-CM

## 2018-04-11 NOTE — Progress Notes (Signed)
Office Visit Note   Patient: Kevin Bradford           Date of Birth: 1945-09-13           MRN: [id number] Visit Date: 04/11/2018              Requested by: Georgann Housekeeper, MD [address] [place], [place] [address] PCP: Georgann Housekeeper, MD  Chief Complaint  Patient presents with  . Left Knee - Routine Post Op    BKA      HPI: Patient is a 72 year old male who is seen for follow-up of his left transtibial amputation revision.  His surgery was back on 12/27/2017.  He had developed a small ulceration of his left residual limb.  He did have a small retained suture and this was removed and he packed the area with some collagen and the wound has resolved.  He is working with physical therapy ambulating with his prosthesis and making good progress.  He feels he is doing great and is having no pain wearing his prosthetic and no concerns.  Assessment & Plan: Visit Diagnoses:  1. Acquired absence of left leg below knee (HCC)   2. Type 2 diabetes mellitus with peripheral neuropathy (HCC)   3. CKD (chronic kidney disease) stage 3, GFR 30-59 ml/min (HCC)     Plan: Small recurrent ulceration has resolved.  He will continue to work with physical therapy ambulating with his prosthesis weightbearing as tolerated.  He has follow-up already scheduled over the next couple of months with Dr. Lajoyce Corners.  Follow-Up Instructions: No follow-ups on file.   Ortho Exam  Patient is alert, oriented, no adenopathy, well-dressed, normal affect, normal respiratory effort. The left transtibial amputation appears well healed. No edema. No cellulitis.   Imaging: No results found. No images are attached to the encounter.  Labs: Lab Results  Component Value Date   HGBA1C 6.7 (H) 11/22/2017   HGBA1C 9.3 (H) 07/19/2017   HGBA1C 11.3 (H) 10/14/2014   ESRSEDRATE 119 (H) 10/18/2014   ESRSEDRATE 115 (H) 10/13/2014   CRP 8.5 (H) 10/18/2014   CRP 21.9 (H) 10/13/2014   LABURIC 3.3 (L) 10/14/2014   REPTSTATUS 07/24/2017 FINAL 07/19/2017   GRAMSTAIN  07/19/2017    FEW WBC PRESENT, PREDOMINANTLY MONONUCLEAR MODERATE GRAM POSITIVE COCCI IN PAIRS MODERATE GRAM NEGATIVE RODS    CULT  07/19/2017    MODERATE ENTEROCOCCUS FAECALIS FEW METHICILLIN RESISTANT STAPHYLOCOCCUS AUREUS MODERATE BACTEROIDES ORALIS BETA LACTAMASE POSITIVE    LABORGA METHICILLIN RESISTANT STAPHYLOCOCCUS AUREUS 07/19/2017   LABORGA ENTEROCOCCUS FAECALIS 07/19/2017     Lab Results  Component Value Date   ALBUMIN 3.5 11/27/2017   ALBUMIN 3.6 08/16/2017   ALBUMIN 2.4 (L) 10/14/2014   LABURIC 3.3 (L) 10/14/2014    Body mass index is 28.25 kg/m.  Orders:  No orders of the defined types were placed in this encounter.  No orders of the defined types were placed in this encounter.    Procedures: No procedures performed  Clinical Data: No additional findings.  ROS:  All other systems negative, except as noted in the HPI. Review of Systems  Objective: Vital Signs: Ht 6\' 2"  (1.88 m)   Wt 220 lb (99.8 kg)   BMI 28.25 kg/m   Specialty Comments:  No specialty comments available.  PMFS History: Patient Active Problem List   Diagnosis Date Noted  . Labile blood glucose   . Acute blood loss anemia   . Labile blood pressure   . Stage 3  chronic kidney disease (HCC)   . Type 2 diabetes mellitus with peripheral neuropathy (HCC)   . Post-operative pain   . Amputation of left lower extremity below knee upon examination (HCC) 11/24/2017  . Amputated below knee, left (HCC) 11/22/2017  . Osteomyelitis of left foot (HCC)   . Dehiscence of amputation stump (HCC) 08/11/2017  . S/P transmetatarsal amputation of foot, left (HCC) 07/19/2017  . Subacute osteomyelitis, left ankle and foot (HCC) 07/17/2017  . Diabetic ulcer of left foot associated with type 2 diabetes mellitus (HCC)   . Diabetes mellitus with renal complications (HCC) 10/17/2014  . Diabetes (HCC) 10/13/2014  . Anemia 10/13/2014  . CKD  (chronic kidney disease) stage 3, GFR 30-59 ml/min (HCC) 10/13/2014  . Hyperkalemia 10/13/2014   Past Medical History:  Diagnosis Date  . Arthritis    "joint pain" (08/16/2017)  . Cellulitis and abscess of leg 10/18/2014  . CKD (chronic kidney disease) stage 3, GFR 30-59 ml/min (HCC)    creatinine increases with antibiotics per pt, no known kidney disease per patient  . Dehiscence of closure of skin, subsequent encounter   . Diabetic foot ulcer (HCC)    left  . Diabetic peripheral neuropathy (HCC)   . GERD (gastroesophageal reflux disease)    12/26/2017- "not this year."   . Osteomyelitis (HCC)    left transmetatarsal   . Type II diabetes mellitus (HCC)     Family History  Problem Relation Age of Onset  . Dementia Mother     Past Surgical History:  Procedure Laterality Date  . AMPUTATION Left 07/19/2017   Procedure: LEFT TRANSMETATARSAL AMPUTATION;  Surgeon: Nadara Mustard, MD;  Location: [facility] OR;  Service: Orthopedics;  Laterality: Left;  . AMPUTATION Left 08/16/2017   REVISION LEFT TRANSMETATARSAL AMPUTATION  . AMPUTATION Left 11/22/2017   Procedure: LEFT BELOW KNEE AMPUTATION;  Surgeon: Nadara Mustard, MD;  Location: [facility] OR;  Service: Orthopedics;  Laterality: Left;  . CATARACT EXTRACTION W/ INTRAOCULAR LENS  IMPLANT, BILATERAL  ~ 2016  . EYE SURGERY Bilateral    catarct  . I&D EXTREMITY Left 10/17/2014   Procedure: IRRIGATION AND DEBRIDEMENT EXTREMITY LEFT FOOT;  Surgeon: Nadara Mustard, MD;  Location: [facility] OR;  Service: Orthopedics;  Laterality: Left;  . STUMP REVISION Left 08/16/2017   Procedure: REVISION LEFT TRANSMETATARSAL AMPUTATION;  Surgeon: Nadara Mustard, MD;  Location: [facility] OR;  Service: Orthopedics;  Laterality: Left;  . STUMP REVISION Left 12/27/2017   Procedure: LEFT BELOW KNEE AMPUTATION REVISION;  Surgeon: Nadara Mustard, MD;  Location: [facility] OR;  Service: Orthopedics;  Laterality: Left;  . TOE AMPUTATION Right 2013   5th toe   Social History   Occupational History  . Not  on file  Tobacco Use  . Smoking status: Never Smoker  . Smokeless tobacco: Never Used  Substance and Sexual Activity  . Alcohol use: Yes    Comment:  "maybe 1 beer 2 times a month"-12/26/2017  . Drug use: No  . Sexual activity: Yes

## 2018-04-11 NOTE — Therapy (Signed)
[facility] [address] [place], [place], [address] Phone: [phone]   Fax:  [phone]  Physical Therapy Treatment  Patient Details  Name: Kevin Bradford MRN: [id number] Date of Birth: 11-17-1945 Referring Provider: Meridee Score, MD   Encounter Date: 04/11/2018  PT End of Session - 04/11/18 0930    Visit Number  11    Number of Visits  25    Date for PT Re-Evaluation  05/30/18    Authorization Type  Medicare & AETNA Medicare 80% AETNA covers 20% once $300 deductible (at eval $91.17 met)    PT Start Time  0850    PT Stop Time  0930    PT Time Calculation (min)  40 min    Equipment Utilized During Treatment  Gait belt    Activity Tolerance  Patient tolerated treatment well;No increased pain    Behavior During Therapy  WFL for tasks assessed/performed       Past Medical History:  Diagnosis Date  . Arthritis    "joint pain" (08/16/2017)  . Cellulitis and abscess of leg 10/18/2014  . CKD (chronic kidney disease) stage 3, GFR 30-59 ml/min (HCC)    creatinine increases with antibiotics per pt, no known kidney disease per patient  . Dehiscence of closure of skin, subsequent encounter   . Diabetic foot ulcer ([place])    left  . Diabetic peripheral neuropathy ([place])   . GERD (gastroesophageal reflux disease)    12/26/2017- "not this year."   . Osteomyelitis ([place])    left transmetatarsal   . Type II diabetes mellitus ([place])     Past Surgical History:  Procedure Laterality Date  . AMPUTATION Left 07/19/2017   Procedure: LEFT TRANSMETATARSAL AMPUTATION;  Surgeon: Newt Minion, MD;  Location: [facility];  Service: Orthopedics;  Laterality: Left;  . AMPUTATION Left 08/16/2017   REVISION LEFT TRANSMETATARSAL AMPUTATION  . AMPUTATION Left 11/22/2017   Procedure: LEFT BELOW KNEE AMPUTATION;  Surgeon: Newt Minion, MD;  Location: [facility];  Service: Orthopedics;  Laterality: Left;  . CATARACT EXTRACTION W/ INTRAOCULAR LENS  IMPLANT,  BILATERAL  ~ 2016  . EYE SURGERY Bilateral    catarct  . I&D EXTREMITY Left 10/17/2014   Procedure: IRRIGATION AND DEBRIDEMENT EXTREMITY LEFT FOOT;  Surgeon: Newt Minion, MD;  Location: [facility];  Service: Orthopedics;  Laterality: Left;  . STUMP REVISION Left 08/16/2017   Procedure: REVISION LEFT TRANSMETATARSAL AMPUTATION;  Surgeon: Newt Minion, MD;  Location: [facility];  Service: Orthopedics;  Laterality: Left;  . STUMP REVISION Left 12/27/2017   Procedure: LEFT BELOW KNEE AMPUTATION REVISION;  Surgeon: Newt Minion, MD;  Location: [facility];  Service: Orthopedics;  Laterality: Left;  . TOE AMPUTATION Right 2013   5th toe    There were no vitals filed for this visit.  Subjective Assessment - 04/11/18 0848    Subjective  No falls. No issues with prosthesis. Wearing all awake hours.     Pertinent History  L TTA, DM, CKD3, PVD, arthritis, peripheral neuropathy    Limitations  Lifting;Standing;Walking;House hold activities    Patient Stated Goals  To use prosthesis to be active fish, yard work, play with grandkids, ride bicycle. Swim in pool.     Currently in Pain?  No/denies                       [facility] Adult PT Treatment/Exercise - 04/11/18 0850      Transfers   Transfers  Sit to Stand;Stand to Sit    Sit to Stand  6: Modified independent (Device/Increase time);With upper extremity assist;From chair/3-in-1    Stand to Sit  6: Modified independent (Device/Increase time);With upper extremity assist;To chair/3-in-1    Floor to Transfer  5: Supervision;With upper extremity assist   pushing on mat table   Floor to Transfer Details (indicate cue type and reason)  demo & verbal cues on technique leading with both LEs with concerns for kneeling on prosthesis with rough surfaces due to suspension sleeve damage. Pt return demo understanding with PT recommendation for 3-5 reps both ways to build LE strength.       Ambulation/Gait   Ambulation/Gait  Yes    Ambulation/Gait Assistance   5: Supervision    Ambulation/Gait Assistance Details  verbal & demo cues on proper step width & equal stance duration    Ambulation Distance (Feet)  500 Feet   x1, 110 x2, plus around gym with activities   Assistive device  Prosthesis;None;Straight cane    Gait Pattern  Step-through pattern;Decreased arm swing - left;Decreased step length - right;Decreased stance time - left;Decreased stride length;Decreased hip/knee flexion - left;Decreased weight shift to left;Left circumduction;Left hip hike;Antalgic;Lateral hip instability;Trunk flexed;Abducted - left;Poor foot clearance - left    Stairs  Yes    Stairs Assistance  5: Supervision    Stairs Assistance Details (indicate cue type and reason)  cueson wt shift & prosthetic placement for descending with knee flexion on prosthetic side    Stair Management Technique  Two rails;Alternating pattern;Forwards    Number of Stairs  4   5 reps     Therapeutic Activites    Lifting  picking up 20# box with cues on foot position    Work [employer] & verbal cues on push / pull type activities with prosthetic foot position and wt shift. Pt return demo understanding.       Neuro Re-ed    Neuro Re-ed Details   PT instructed in mounting/dismounting bike, starting /stopping and turning initially curves then 90*.  PT recommended starting in open parking lot and not wearing clip shoes until able to engage/disengage clip on prosthetic side on stationary bike. Pt verbalized understanding      Prosthetics   Prosthetic Care Comments   changing shoes & heel ht on prosthesis and knee control.     Current prosthetic wear tolerance (days/week)   daily    Current prosthetic wear tolerance (#hours/day)   most awake hours, drying every 3-4 hours    Residual limb condition   intact    Education Provided  Proper Donning   see prosthetic care comments   Person(s) Educated  Patient    Education Method  Explanation;Verbal cues    Education Method  Verbalized  understanding               PT Short Term Goals - 04/11/18 2047      PT SHORT TERM GOAL #1   Title  Patient verbalizes how to properly change shoes on prosthesis. (All STGs Target Date: 04/26/18)    Baseline       Time  4    Period  Weeks    Status  New    Target Date  04/26/18      PT SHORT TERM GOAL #2   Title  Patient verbalizes how to mount /dismount and start /stop on his bicycle.     Time  4    Period  Weeks  Status  New    Target Date  04/26/18      PT SHORT TERM GOAL #3   Title  Patient ambulates 52' with prosthesis only  with supervision/cues to improve deviations.     Time  4    Period  Weeks    Status  New    Target Date  04/26/18      PT SHORT TERM GOAL #4   Title  Patient negotiates ramps, curbs and stairs with 1 rail with prosthesis only with supervision.     Time  4    Period  Weeks    Status  New    Target Date  04/26/18      PT SHORT TERM GOAL #5   Title  Patient able to pick up & carry 20# box with cues.     Time  4    Period  Weeks    Status  New    Target Date  04/26/18        PT Long Term Goals - 03/07/18 0934      PT LONG TERM GOAL #1   Title  Patient demonstrates & verbalizes understanding of prosthetic care to enable safe use of prosthesis. (All LTGs Target Date: 05/25/2018)    Time  12    Period  Weeks    Status  On-going    Target Date  05/25/18      PT LONG TERM GOAL #2   Title  Patient tolerates wear of prosthesis >90% of awake hours without skin or limb pain issues to enable function throughout his day.     Time  12    Period  Weeks    Status  On-going    Target Date  05/25/18      PT LONG TERM GOAL #3   Title  Patient ambulates >1000' outdoors including grass, ramps & curbs with prosthesis only independently for community mobility.     Time  12    Period  Weeks    Status  On-going    Target Date  05/25/18      PT LONG TERM GOAL #4   Title  Berg Balance >45/56 to indicate lower fall risk.    Time  12     Period  Weeks    Status  On-going    Target Date  05/25/18      PT LONG TERM GOAL #5   Title  Patient perform floor transfers, lifts & carries 25# and push/pull to enable to return to yard work & other patient goals.     Time  12    Period  Weeks    Status  On-going    Target Date  05/25/18            Plan - 04/11/18 2051    Clinical Impression Statement  Patient verbalizes understanding on cycling with TTA prosthesis. He improved floor transfers but needs BUE pushing currently. Patient is not certain with early discharge and maximizing his potential with TTA prosthesis.     Rehab Potential  Good    PT Frequency  2x / week    PT Duration  12 weeks    PT Treatment/Interventions  ADLs/Self Care Home Management;Gait training;Stair training;Functional mobility training;Therapeutic activities;Therapeutic exercise;DME Instruction;Balance training;Neuromuscular re-education;Patient/family education;Prosthetic Training;Vestibular;Manual techniques    PT Next Visit Plan  work towards updated STGs.     Consulted and Agree with Plan of Care  Patient       Patient will benefit from  skilled therapeutic intervention in order to improve the following deficits and impairments:  Abnormal gait, Decreased activity tolerance, Decreased balance, Decreased endurance, Decreased knowledge of use of DME, Decreased mobility, Decreased strength, Dizziness, Impaired flexibility, Postural dysfunction, Prosthetic Dependency  Visit Diagnosis: Muscle weakness (generalized)  Other abnormalities of gait and mobility  Unsteadiness on feet  Abnormal posture     Problem List Patient Active Problem List   Diagnosis Date Noted  . Labile blood glucose   . Acute blood loss anemia   . Labile blood pressure   . Stage 3 chronic kidney disease ([place])   . Type 2 diabetes mellitus with peripheral neuropathy (HCC)   . Post-operative pain   . Amputation of left lower extremity below knee upon examination ([place])  11/24/2017  . Amputated below knee, left ([place]) 11/22/2017  . Osteomyelitis of left foot ([place])   . Dehiscence of amputation stump ([place]) 08/11/2017  . S/P transmetatarsal amputation of foot, left ([place]) 07/19/2017  . Subacute osteomyelitis, left ankle and foot ([place]) 07/17/2017  . Diabetic ulcer of left foot associated with type 2 diabetes mellitus ([place])   . Diabetes mellitus with renal complications ([place]) 07/10/4974  . Diabetes ([place]) 10/13/2014  . Anemia 10/13/2014  . CKD (chronic kidney disease) stage 3, GFR 30-59 ml/min (HCC) 10/13/2014  . Hyperkalemia 10/13/2014    Shahara Hartsfield PT, DPT 04/11/2018, 8:54 PM  [facility] [address] [place], [place], [address] Phone: [phone]   Fax:  [phone]  Name: Kevin Bradford MRN: [id number] Date of Birth: 06-27-1946

## 2018-04-16 ENCOUNTER — Ambulatory Visit: Payer: Medicare Other | Admitting: Physical Therapy

## 2018-04-16 ENCOUNTER — Encounter: Payer: Self-pay | Admitting: Physical Therapy

## 2018-04-16 DIAGNOSIS — R2681 Unsteadiness on feet: Secondary | ICD-10-CM | POA: Diagnosis not present

## 2018-04-16 DIAGNOSIS — R293 Abnormal posture: Secondary | ICD-10-CM | POA: Diagnosis not present

## 2018-04-16 DIAGNOSIS — R2689 Other abnormalities of gait and mobility: Secondary | ICD-10-CM | POA: Diagnosis not present

## 2018-04-16 DIAGNOSIS — M6281 Muscle weakness (generalized): Secondary | ICD-10-CM | POA: Diagnosis not present

## 2018-04-16 NOTE — Therapy (Signed)
[facility] [address] [place], [place], [address] Phone: [phone]   Fax:  [phone]  Physical Therapy Treatment  Patient Details  Name: Kevin Bradford MRN: [id number] Date of Birth: 10/08/1945 Referring Provider (PT): Meridee Score, MD   Encounter Date: 04/16/2018  PT End of Session - 04/16/18 0857    Visit Number  12    Number of Visits  25    Date for PT Re-Evaluation  05/30/18    Authorization Type  Medicare & AETNA Medicare 80% AETNA covers 20% once $300 deductible (at eval $91.17 met)    PT Start Time  0848    PT Stop Time  0930    PT Time Calculation (min)  42 min    Equipment Utilized During Treatment  Gait belt    Activity Tolerance  Patient tolerated treatment well;No increased pain    Behavior During Therapy  WFL for tasks assessed/performed       Past Medical History:  Diagnosis Date  . Arthritis    "joint pain" (08/16/2017)  . Cellulitis and abscess of leg 10/18/2014  . CKD (chronic kidney disease) stage 3, GFR 30-59 ml/min (HCC)    creatinine increases with antibiotics per pt, no known kidney disease per patient  . Dehiscence of closure of skin, subsequent encounter   . Diabetic foot ulcer ([place])    left  . Diabetic peripheral neuropathy ([place])   . GERD (gastroesophageal reflux disease)    12/26/2017- "not this year."   . Osteomyelitis ([place])    left transmetatarsal   . Type II diabetes mellitus ([place])     Past Surgical History:  Procedure Laterality Date  . AMPUTATION Left 07/19/2017   Procedure: LEFT TRANSMETATARSAL AMPUTATION;  Surgeon: Newt Minion, MD;  Location: [facility];  Service: Orthopedics;  Laterality: Left;  . AMPUTATION Left 08/16/2017   REVISION LEFT TRANSMETATARSAL AMPUTATION  . AMPUTATION Left 11/22/2017   Procedure: LEFT BELOW KNEE AMPUTATION;  Surgeon: Newt Minion, MD;  Location: [facility];  Service: Orthopedics;  Laterality: Left;  . CATARACT EXTRACTION W/ INTRAOCULAR LENS   IMPLANT, BILATERAL  ~ 2016  . EYE SURGERY Bilateral    catarct  . I&D EXTREMITY Left 10/17/2014   Procedure: IRRIGATION AND DEBRIDEMENT EXTREMITY LEFT FOOT;  Surgeon: Newt Minion, MD;  Location: [facility];  Service: Orthopedics;  Laterality: Left;  . STUMP REVISION Left 08/16/2017   Procedure: REVISION LEFT TRANSMETATARSAL AMPUTATION;  Surgeon: Newt Minion, MD;  Location: [facility];  Service: Orthopedics;  Laterality: Left;  . STUMP REVISION Left 12/27/2017   Procedure: LEFT BELOW KNEE AMPUTATION REVISION;  Surgeon: Newt Minion, MD;  Location: [facility];  Service: Orthopedics;  Laterality: Left;  . TOE AMPUTATION Right 2013   5th toe    There were no vitals filed for this visit.  Subjective Assessment - 04/16/18 0852    Subjective  No new complaints. Did try the mountain bike, however with seat at lowest point his feet do not touch the ground. He decided to try riding anyway. Mouting was okay, however he did not have a good plan for dismounting. He reports he "aimed for the grass and went down toward the left side". A neighbor saw him down and offered to assist him up. He reports no injuries. Primary PT made award of fall. Pt plans to bring his other bike to next session to practice mount and dismount.     Pertinent History  L TTA, DM, CKD3, PVD, arthritis,  peripheral neuropathy    Limitations  Lifting;Standing;Walking;House hold activities    Patient Stated Goals  To use prosthesis to be active fish, yard work, play with grandkids, ride bicycle. Swim in pool.     Currently in Pain?  No/denies               [facility] Adult PT Treatment/Exercise - 04/16/18 0858      Transfers   Transfers  Sit to Stand;Stand to Sit    Sit to Stand  6: Modified independent (Device/Increase time);With upper extremity assist;From chair/3-in-1    Stand to Sit  6: Modified independent (Device/Increase time);With upper extremity assist;To chair/3-in-1      Ambulation/Gait   Ambulation/Gait  Yes    Ambulation/Gait  Assistance  5: Supervision    Ambulation Distance (Feet)  --   around gym with activities   Assistive device  Prosthesis;None    Gait Pattern  Step-through pattern;Decreased arm swing - left;Decreased step length - right;Decreased stance time - left;Decreased stride length;Decreased hip/knee flexion - left;Decreased weight shift to left;Left circumduction;Left hip hike;Antalgic;Lateral hip instability;Trunk flexed;Abducted - left;Poor foot clearance - left    Ambulation Surface  Level;Indoor      High Level Balance   High Level Balance Activities  Head turns    High Level Balance Comments  with prosthesis only along ~50 foot hallway: fwd gait with head movements left<>fwd<>right, then up<>fwd<>down. 2 laps each with min guard assist for balance, minor veering noted, no significant balance loss.                             Prosthetics   Current prosthetic wear tolerance (days/week)   daily    Current prosthetic wear tolerance (#hours/day)   most awake hours, drying every 3-4 hours    Residual limb condition   intact    Donning Prosthesis  Supervision    Doffing Prosthesis  Supervision          Balance Exercises - 04/16/18 0920      Balance Exercises: Standing   Rockerboard  Anterior/posterior;Lateral;Head turns;EO;EC;30 seconds;10 reps    Balance Beam  standing across blue foam beam: alternating fwd stepping to floor/back onto beam, then alternating bwd stepping to floor/back onto beam x 10 reps each with light UE support progressing to no UE support as reps progressed. min guard assist with cues on increased step length/step height and on weight shifting.       Balance Exercises: Standing   Rebounder Limitations  performed both ways on balance board: holding board steady for alternating UE raises, progressing to bil UE raises. then with arms at sides- EC no head movements, progressing to EC head movements left<>right, then up<>down. min guard assist with occasional touch to bars for  balance.           PT Short Term Goals - 04/11/18 2047      PT SHORT TERM GOAL #1   Title  Patient verbalizes how to properly change shoes on prosthesis. (All STGs Target Date: 04/26/18)    Baseline       Time  4    Period  Weeks    Status  New    Target Date  04/26/18      PT SHORT TERM GOAL #2   Title  Patient verbalizes how to mount /dismount and start /stop on his bicycle.     Time  4    Period  Weeks  Status  New    Target Date  04/26/18      PT SHORT TERM GOAL #3   Title  Patient ambulates 52' with prosthesis only  with supervision/cues to improve deviations.     Time  4    Period  Weeks    Status  New    Target Date  04/26/18      PT SHORT TERM GOAL #4   Title  Patient negotiates ramps, curbs and stairs with 1 rail with prosthesis only with supervision.     Time  4    Period  Weeks    Status  New    Target Date  04/26/18      PT SHORT TERM GOAL #5   Title  Patient able to pick up & carry 20# box with cues.     Time  4    Period  Weeks    Status  New    Target Date  04/26/18        PT Long Term Goals - 03/07/18 0934      PT LONG TERM GOAL #1   Title  Patient demonstrates & verbalizes understanding of prosthetic care to enable safe use of prosthesis. (All LTGs Target Date: 05/25/2018)    Time  12    Period  Weeks    Status  On-going    Target Date  05/25/18      PT LONG TERM GOAL #2   Title  Patient tolerates wear of prosthesis >90% of awake hours without skin or limb pain issues to enable function throughout his day.     Time  12    Period  Weeks    Status  On-going    Target Date  05/25/18      PT LONG TERM GOAL #3   Title  Patient ambulates >1000' outdoors including grass, ramps & curbs with prosthesis only independently for community mobility.     Time  12    Period  Weeks    Status  On-going    Target Date  05/25/18      PT LONG TERM GOAL #4   Title  Berg Balance >45/56 to indicate lower fall risk.    Time  12    Period  Weeks     Status  On-going    Target Date  05/25/18      PT LONG TERM GOAL #5   Title  Patient perform floor transfers, lifts & carries 25# and push/pull to enable to return to yard work & other patient goals.     Time  12    Period  Weeks    Status  On-going    Target Date  05/25/18            Plan - 04/16/18 0857    Clinical Impression Statement  Today's skilled session focused on balance reactions with prosthesis only and working on decreased UE suport for stability. The pt continues to make steady progress toward goals and should benefit from continued PT to progress toward unmet goals.     Rehab Potential  Good    PT Frequency  2x / week    PT Duration  12 weeks    PT Treatment/Interventions  ADLs/Self Care Home Management;Gait training;Stair training;Functional mobility training;Therapeutic activities;Therapeutic exercise;DME Instruction;Balance training;Neuromuscular re-education;Patient/family education;Prosthetic Training;Vestibular;Manual techniques    PT Next Visit Plan  work towards updated STGs.     Consulted and Agree with Plan of Care  Patient  Patient will benefit from skilled therapeutic intervention in order to improve the following deficits and impairments:  Abnormal gait, Decreased activity tolerance, Decreased balance, Decreased endurance, Decreased knowledge of use of DME, Decreased mobility, Decreased strength, Dizziness, Impaired flexibility, Postural dysfunction, Prosthetic Dependency  Visit Diagnosis: Muscle weakness (generalized)  Other abnormalities of gait and mobility  Unsteadiness on feet  Abnormal posture     Problem List Patient Active Problem List   Diagnosis Date Noted  . Labile blood glucose   . Acute blood loss anemia   . Labile blood pressure   . Stage 3 chronic kidney disease ([place])   . Type 2 diabetes mellitus with peripheral neuropathy (HCC)   . Post-operative pain   . Amputation of left lower extremity below knee upon examination  ([place]) 11/24/2017  . Amputated below knee, left ([facility] [place]) 11/22/2017  . Osteomyelitis of left foot ([place])   . Dehiscence of amputation stump ([place]) 08/11/2017  . S/P transmetatarsal amputation of foot, left ([place]) 07/19/2017  . Subacute osteomyelitis, left ankle and foot ([place]) 07/17/2017  . Diabetic ulcer of left foot associated with type 2 diabetes mellitus ([place])   . Diabetes mellitus with renal complications ([place]) 38/04/1750  . Diabetes ([place]) 10/13/2014  . Anemia 10/13/2014  . CKD (chronic kidney disease) stage 3, GFR 30-59 ml/min (HCC) 10/13/2014  . Hyperkalemia 10/13/2014    Willow Ora, PTA, [facility] [address], [place], [place] [address] [phone] 04/16/18, 4:02 PM   Name: Kevin Bradford MRN: [id number] Date of Birth: [birth year]

## 2018-04-18 ENCOUNTER — Ambulatory Visit: Payer: Medicare Other | Attending: Orthopedic Surgery | Admitting: Physical Therapy

## 2018-04-18 ENCOUNTER — Encounter: Payer: Self-pay | Admitting: Physical Therapy

## 2018-04-18 DIAGNOSIS — Z9181 History of falling: Secondary | ICD-10-CM | POA: Diagnosis not present

## 2018-04-18 DIAGNOSIS — R2681 Unsteadiness on feet: Secondary | ICD-10-CM

## 2018-04-18 DIAGNOSIS — R2689 Other abnormalities of gait and mobility: Secondary | ICD-10-CM | POA: Diagnosis not present

## 2018-04-18 DIAGNOSIS — R293 Abnormal posture: Secondary | ICD-10-CM

## 2018-04-18 DIAGNOSIS — M6281 Muscle weakness (generalized): Secondary | ICD-10-CM
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
## 2018-04-23 ENCOUNTER — Ambulatory Visit: Payer: Medicare Other | Admitting: Rehabilitation

## 2018-04-23 ENCOUNTER — Encounter: Payer: Self-pay | Admitting: Rehabilitation

## 2018-04-23 DIAGNOSIS — M6281 Muscle weakness (generalized): Secondary | ICD-10-CM | POA: Diagnosis not present

## 2018-04-23 DIAGNOSIS — R2689 Other abnormalities of gait and mobility: Secondary | ICD-10-CM

## 2018-04-23 DIAGNOSIS — R2681 Unsteadiness on feet: Secondary | ICD-10-CM | POA: Diagnosis not present

## 2018-04-23 DIAGNOSIS — R293 Abnormal posture: Secondary | ICD-10-CM | POA: Diagnosis not present

## 2018-04-23 DIAGNOSIS — Z9181 History of falling: Secondary | ICD-10-CM | POA: Diagnosis not present

## 2018-04-23 NOTE — Therapy (Signed)
[facility] [address] [place], [place], [address] Phone: [phone]   Fax:  [phone]  Physical Therapy Treatment  Patient Details  Name: Kevin Bradford MRN: [id number] Date of Birth: 1946-04-05 Referring Provider (PT): Meridee Score, MD   Encounter Date: 04/23/2018  PT End of Session - 04/23/18 1033    Visit Number  14    Number of Visits  25    Date for PT Re-Evaluation  05/30/18    Authorization Type  Medicare & AETNA Medicare 80% AETNA covers 20% once $300 deductible (at eval $91.17 met)    PT Start Time  0845    PT Stop Time  0930    PT Time Calculation (min)  45 min    Equipment Utilized During Treatment  Gait belt    Activity Tolerance  Patient tolerated treatment well;No increased pain    Behavior During Therapy  WFL for tasks assessed/performed       Past Medical History:  Diagnosis Date  . Arthritis    "joint pain" (08/16/2017)  . Cellulitis and abscess of leg 10/18/2014  . CKD (chronic kidney disease) stage 3, GFR 30-59 ml/min (HCC)    creatinine increases with antibiotics per pt, no known kidney disease per patient  . Dehiscence of closure of skin, subsequent encounter   . Diabetic foot ulcer ([place])    left  . Diabetic peripheral neuropathy ([place])   . GERD (gastroesophageal reflux disease)    12/26/2017- "not this year."   . Osteomyelitis ([place])    left transmetatarsal   . Type II diabetes mellitus ([place])     Past Surgical History:  Procedure Laterality Date  . AMPUTATION Left 07/19/2017   Procedure: LEFT TRANSMETATARSAL AMPUTATION;  Surgeon: Newt Minion, MD;  Location: [facility];  Service: Orthopedics;  Laterality: Left;  . AMPUTATION Left 08/16/2017   REVISION LEFT TRANSMETATARSAL AMPUTATION  . AMPUTATION Left 11/22/2017   Procedure: LEFT BELOW KNEE AMPUTATION;  Surgeon: Newt Minion, MD;  Location: [facility];  Service: Orthopedics;  Laterality: Left;  . CATARACT EXTRACTION W/ INTRAOCULAR LENS   IMPLANT, BILATERAL  ~ 2016  . EYE SURGERY Bilateral    catarct  . I&D EXTREMITY Left 10/17/2014   Procedure: IRRIGATION AND DEBRIDEMENT EXTREMITY LEFT FOOT;  Surgeon: Newt Minion, MD;  Location: [facility];  Service: Orthopedics;  Laterality: Left;  . STUMP REVISION Left 08/16/2017   Procedure: REVISION LEFT TRANSMETATARSAL AMPUTATION;  Surgeon: Newt Minion, MD;  Location: [facility];  Service: Orthopedics;  Laterality: Left;  . STUMP REVISION Left 12/27/2017   Procedure: LEFT BELOW KNEE AMPUTATION REVISION;  Surgeon: Newt Minion, MD;  Location: [facility];  Service: Orthopedics;  Laterality: Left;  . TOE AMPUTATION Right 2013   5th toe    There were no vitals filed for this visit.  Subjective Assessment - 04/23/18 0847    Subjective  Pt reports doing well.  Has gotten on bike again, doing well with that.     Pertinent History  L TTA, DM, CKD3, PVD, arthritis, peripheral neuropathy    Limitations  Lifting;Standing;Walking;House hold activities    Patient Stated Goals  To use prosthesis to be active fish, yard work, play with grandkids, ride bicycle. Swim in pool.     Currently in Pain?  No/denies                       [facility] Adult PT Treatment/Exercise - 04/23/18 7654  Transfers   Transfers  Sit to Stand;Stand to Sit    Sit to Stand  7: Independent    Stand to Sit  7: Independent      Ambulation/Gait   Ambulation/Gait  Yes    Ambulation/Gait Assistance  6: Modified independent (Device/Increase time)    Ambulation/Gait Assistance Details  Pt able to ambulate >1000' over unlevel paved surfaces outdoors at S to mod I level.  Min cues for posture, however doing well otherwise.      Ambulation Distance (Feet)  1200 Feet    Assistive device  Prosthesis;None    Gait Pattern  Step-through pattern;Decreased arm swing - left;Decreased step length - right;Decreased stance time - left;Decreased stride length;Decreased hip/knee flexion - left;Decreased weight shift to left;Left  circumduction;Left hip hike;Antalgic;Lateral hip instability;Trunk flexed;Abducted - left;Poor foot clearance - left    Ambulation Surface  Level;Unlevel;Indoor;Outdoor;Paved    Stairs  Yes    Stairs Assistance  5: Supervision    Stairs Assistance Details (indicate cue type and reason)  Cues for reciprocal pattern and technique for prosthesis placement.  Performed x 3 reps with LUE support only.  Pt reports "feeling out of comfort zone" but did very well with task.     Stair Management Technique  One rail Left;Alternating pattern;Forwards    Number of Stairs  12    Height of Stairs  8      High Level Balance   High Level Balance Activities  Direction changes;Marching forwards    High Level Balance Comments  At counter top with prosthesis marching forward with emphasis on SLS and improved L LE hip extension.  Performed x 4 laps.  Standing hip abd and hip extension BLE x 10 reps each with emphasis on decreased UE support for SLS and also for improved LLE hip and knee extension.  Balance on small rocker board with feet apart x 20 secs progressing to LLE in stance on rocker board tapping RLE from floor to chair seat x 10 reps with cues for posture and light UE support as able.        Exercises   Exercises  Other Exercises    Other Exercises   Forward step ups x 10 reps with LLE as power leg with cues for improved hip and knee extension.  Progressed to step up with RLE high knee march once up on step x 5 reps.   Hooklying BLE bridging x 10 reps>LLE only bridging x 10 reps-added to HEP, supine L hip flex stretch of EOM x 2 mins.              PT Education - 04/23/18 0848    Education Details  additional exercises for hip strength and flexibility    Person(s) Educated  Patient    Methods  Explanation;Demonstration;Handout    Comprehension  Verbalized understanding       PT Short Term Goals - 04/23/18 0850      PT SHORT TERM GOAL #1   Title  Patient verbalizes how to properly change shoes  on prosthesis. (All STGs Target Date: 04/26/18)    Baseline  MET 04/18/2018    Time  4    Period  Weeks    Status  Achieved      PT SHORT TERM GOAL #2   Title  Patient verbalizes how to mount /dismount and start /stop on his bicycle.     Baseline  MET 04/18/2018    Time  4    Period  Weeks  Status  Achieved      PT SHORT TERM GOAL #3   Title  Patient ambulates 700' with prosthesis only  with supervision/cues to improve deviations.     Baseline  met 04/23/18    Time  4    Period  Weeks    Status  Achieved      PT SHORT TERM GOAL #4   Title  Patient negotiates ramps, curbs and stairs with 1 rail with prosthesis only with supervision.     Baseline  met 04/23/18    Time  4    Period  Weeks    Status  Achieved      PT SHORT TERM GOAL #5   Title  Patient able to pick up & carry 20# box with cues.     Baseline  met 04/23/18 (per pt report)    Time  4    Period  Weeks    Status  Achieved        PT Long Term Goals - 03/07/18 0934      PT LONG TERM GOAL #1   Title  Patient demonstrates & verbalizes understanding of prosthetic care to enable safe use of prosthesis. (All LTGs Target Date: 05/25/2018)    Time  12    Period  Weeks    Status  On-going    Target Date  05/25/18      PT LONG TERM GOAL #2   Title  Patient tolerates wear of prosthesis >90% of awake hours without skin or limb pain issues to enable function throughout his day.     Time  12    Period  Weeks    Status  On-going    Target Date  05/25/18      PT LONG TERM GOAL #3   Title  Patient ambulates >1000' outdoors including grass, ramps & curbs with prosthesis only independently for community mobility.     Time  12    Period  Weeks    Status  On-going    Target Date  05/25/18      PT LONG TERM GOAL #4   Title  Berg Balance >45/56 to indicate lower fall risk.    Time  12    Period  Weeks    Status  On-going    Target Date  05/25/18      PT LONG TERM GOAL #5   Title  Patient perform floor transfers, lifts  & carries 25# and push/pull to enable to return to yard work & other patient goals.     Time  12    Period  Weeks    Status  On-going    Target Date  05/25/18            Plan - 04/23/18 1033    Clinical Impression Statement  Skilled session focused on increasing confidence and strength when in LLE WB/SLS situations to carryover to improved gait and ability to complete reciprocal pattern with stair negotiation.  Also addressed remaining STGs.  Pt has met 5/5 STGs and making excellent progress towards remaining LTGs.     Rehab Potential  Good    PT Frequency  2x / week    PT Duration  12 weeks    PT Treatment/Interventions  ADLs/Self Care Home Management;Gait training;Stair training;Functional mobility training;Therapeutic activities;Therapeutic exercise;DME Instruction;Balance training;Neuromuscular re-education;Patient/family education;Prosthetic Training;Vestibular;Manual techniques    PT Next Visit Plan  stairs with reciprocal pattern, LLE SLS exercises, L hip extension    Consulted and Agree with   Plan of Care  Patient       Patient will benefit from skilled therapeutic intervention in order to improve the following deficits and impairments:  Abnormal gait, Decreased activity tolerance, Decreased balance, Decreased endurance, Decreased knowledge of use of DME, Decreased mobility, Decreased strength, Dizziness, Impaired flexibility, Postural dysfunction, Prosthetic Dependency  Visit Diagnosis: Muscle weakness (generalized)  Other abnormalities of gait and mobility  Unsteadiness on feet     Problem List Patient Active Problem List   Diagnosis Date Noted  . Labile blood glucose   . Acute blood loss anemia   . Labile blood pressure   . Stage 3 chronic kidney disease ([place])   . Type 2 diabetes mellitus with peripheral neuropathy (HCC)   . Post-operative pain   . Amputation of left lower extremity below knee upon examination ([place]) 11/24/2017  . Amputated below knee, left  ([place]) 11/22/2017  . Osteomyelitis of left foot ([place])   . Dehiscence of amputation stump ([place]) 08/11/2017  . S/P transmetatarsal amputation of foot, left ([place]) 07/19/2017  . Subacute osteomyelitis, left ankle and foot ([place]) 07/17/2017  . Diabetic ulcer of left foot associated with type 2 diabetes mellitus ([place])   . Diabetes mellitus with renal complications ([place]) 02/40/9735  . Diabetes ([place]) 10/13/2014  . Anemia 10/13/2014  . CKD (chronic kidney disease) stage 3, GFR 30-59 ml/min (HCC) 10/13/2014  . Hyperkalemia 10/13/2014    Cameron Sprang, PT, MPT [facility] [address] [place], [place], [address] Phone: [phone]   Fax:  [phone] 04/23/18, 10:36 AM  Name: Kevin Bradford MRN: [id number] Date of Birth: August 22, 1945

## 2018-04-23 NOTE — Patient Instructions (Signed)
Bridging (Single Leg)    Lie on back with feet shoulder width apart and right leg straight. Lift hips toward the ceiling while keeping leg straight. Hold __2-3__ seconds. Repeat __5__ times, rest then do 5 more reps. Do _2___ sessions per day.  http://gt2.exer.us/358   Copyright  VHI. All rights reserved.   Hip Flexor Stretch    Lying on back near edge of bed, bend one leg, foot flat. Hang other leg over edge, relaxed, thigh resting entirely on bed for __1-2__ minutes. Repeat _1-2___ times. Do _2___ sessions per day. Advanced Exercise: Bend knee back keeping thigh in contact with bed.  http://gt2.exer.us/346   Copyright  VHI. All rights reserved.

## 2018-04-25 ENCOUNTER — Ambulatory Visit: Payer: Medicare Other | Admitting: Physical Therapy

## 2018-04-25 ENCOUNTER — Encounter: Payer: Self-pay | Admitting: Physical Therapy

## 2018-04-25 DIAGNOSIS — I739 Peripheral vascular disease, unspecified: Secondary | ICD-10-CM | POA: Diagnosis not present

## 2018-04-25 DIAGNOSIS — R293 Abnormal posture: Secondary | ICD-10-CM | POA: Diagnosis not present

## 2018-04-25 DIAGNOSIS — M6281 Muscle weakness (generalized): Secondary | ICD-10-CM

## 2018-04-25 DIAGNOSIS — R2681 Unsteadiness on feet: Secondary | ICD-10-CM

## 2018-04-25 DIAGNOSIS — N183 Chronic kidney disease, stage 3 (moderate): Secondary | ICD-10-CM | POA: Diagnosis not present

## 2018-04-25 DIAGNOSIS — Z9181 History of falling: Secondary | ICD-10-CM | POA: Diagnosis not present

## 2018-04-25 DIAGNOSIS — Z1211 Encounter for screening for malignant neoplasm of colon: Secondary | ICD-10-CM | POA: Diagnosis not present

## 2018-04-25 DIAGNOSIS — E1122 Type 2 diabetes mellitus with diabetic chronic kidney disease: Secondary | ICD-10-CM | POA: Diagnosis not present

## 2018-04-25 DIAGNOSIS — R2689 Other abnormalities of gait and mobility: Secondary | ICD-10-CM | POA: Diagnosis not present

## 2018-04-25 DIAGNOSIS — Z89512 Acquired absence of left leg below knee: Secondary | ICD-10-CM | POA: Diagnosis not present

## 2018-04-25 DIAGNOSIS — K219 Gastro-esophageal reflux disease without esophagitis: Secondary | ICD-10-CM | POA: Diagnosis not present

## 2018-04-25 NOTE — Therapy (Signed)
[facility] [address] [place], [place], [address] Phone: [phone]   Fax:  [phone]  Physical Therapy Treatment  Patient Details  Name: Kevin Bradford MRN: [id number] Date of Birth: 05/30/1946 Referring Provider (PT): Meridee Score, MD   Encounter Date: 04/25/2018  PT End of Session - 04/25/18 0937    Visit Number  15    Number of Visits  25    Date for PT Re-Evaluation  05/30/18    Authorization Type  Medicare & AETNA Medicare 80% AETNA covers 20% once $300 deductible (at eval $91.17 met)    PT Start Time  0845    PT Stop Time  0930    PT Time Calculation (min)  45 min    Equipment Utilized During Treatment  Gait belt    Activity Tolerance  Patient tolerated treatment well;No increased pain    Behavior During Therapy  WFL for tasks assessed/performed       Past Medical History:  Diagnosis Date  . Arthritis    "joint pain" (08/16/2017)  . Cellulitis and abscess of leg 10/18/2014  . CKD (chronic kidney disease) stage 3, GFR 30-59 ml/min (HCC)    creatinine increases with antibiotics per pt, no known kidney disease per patient  . Dehiscence of closure of skin, subsequent encounter   . Diabetic foot ulcer ([place])    left  . Diabetic peripheral neuropathy ([place])   . GERD (gastroesophageal reflux disease)    12/26/2017- "not this year."   . Osteomyelitis ([place])    left transmetatarsal   . Type II diabetes mellitus ([place])     Past Surgical History:  Procedure Laterality Date  . AMPUTATION Left 07/19/2017   Procedure: LEFT TRANSMETATARSAL AMPUTATION;  Surgeon: Newt Minion, MD;  Location: [facility];  Service: Orthopedics;  Laterality: Left;  . AMPUTATION Left 08/16/2017   REVISION LEFT TRANSMETATARSAL AMPUTATION  . AMPUTATION Left 11/22/2017   Procedure: LEFT BELOW KNEE AMPUTATION;  Surgeon: Newt Minion, MD;  Location: [facility];  Service: Orthopedics;  Laterality: Left;  . CATARACT EXTRACTION W/ INTRAOCULAR LENS   IMPLANT, BILATERAL  ~ 2016  . EYE SURGERY Bilateral    catarct  . I&D EXTREMITY Left 10/17/2014   Procedure: IRRIGATION AND DEBRIDEMENT EXTREMITY LEFT FOOT;  Surgeon: Newt Minion, MD;  Location: [facility];  Service: Orthopedics;  Laterality: Left;  . STUMP REVISION Left 08/16/2017   Procedure: REVISION LEFT TRANSMETATARSAL AMPUTATION;  Surgeon: Newt Minion, MD;  Location: [facility];  Service: Orthopedics;  Laterality: Left;  . STUMP REVISION Left 12/27/2017   Procedure: LEFT BELOW KNEE AMPUTATION REVISION;  Surgeon: Newt Minion, MD;  Location: [facility];  Service: Orthopedics;  Laterality: Left;  . TOE AMPUTATION Right 2013   5th toe    There were no vitals filed for this visit.  Subjective Assessment - 04/25/18 0854    Subjective  He reports cycling is going well. "I am not mentally comfortable with steps alternating especially at night."  Patient reports he wants to continue PT as long as he is improving & learning things.     Pertinent History  L TTA, DM, CKD3, PVD, arthritis, peripheral neuropathy    Limitations  Lifting;Standing;Walking;House hold activities    Patient Stated Goals  To use prosthesis to be active fish, yard work, play with grandkids, ride bicycle. Swim in pool.     Currently in Pain?  No/denies       39M walk test comfortable pace 2.46 ft/sec  and fast pace 3.53 ft/sec PT demo, instructed in safe use of Treadmill including getting on/off, stepping on/off belt, use of safety lanyard and maintaining UE support but reduce lift. Pt return demo understanding and ambulated initially 1.52mh progressing up to 1.824m for 5 min total.  Pt demo proper posture with upper body weight machines & verbal instruction in use of knee extension & flexion machines. Pt verbalized understanding. PT instructed & pt return demo understanding of leg press BLEs with 90# 15 reps (used 70# for too light & 110# for too heavy for education on determining proper weight). Pt verbalized understanding.                           PT Education - 04/25/18 0920    Education Details  fitness plan including flexibility, strength, balance & endurance activities. Use of Treadmill & weight machines upper body & lower body.     Person(s) Educated  Patient    Methods  Explanation;Demonstration;Verbal cues    Comprehension  Verbalized understanding;Returned demonstration;Verbal cues required       PT Short Term Goals - 04/23/18 0850      PT SHORT TERM GOAL #1   Title  Patient verbalizes how to properly change shoes on prosthesis. (All STGs Target Date: 04/26/18)    Baseline  MET 04/18/2018    Time  4    Period  Weeks    Status  Achieved      PT SHORT TERM GOAL #2   Title  Patient verbalizes how to mount /dismount and start /stop on his bicycle.     Baseline  MET 04/18/2018    Time  4    Period  Weeks    Status  Achieved      PT SHORT TERM GOAL #3   Title  Patient ambulates 7058with prosthesis only  with supervision/cues to improve deviations.     Baseline  met 04/23/18    Time  4    Period  Weeks    Status  Achieved      PT SHORT TERM GOAL #4   Title  Patient negotiates ramps, curbs and stairs with 1 rail with prosthesis only with supervision.     Baseline  met 04/23/18    Time  4    Period  Weeks    Status  Achieved      PT SHORT TERM GOAL #5   Title  Patient able to pick up & carry 20# box with cues.     Baseline  met 04/23/18 (per pt report)    Time  4    Period  Weeks    Status  Achieved        PT Long Term Goals - 03/07/18 0934      PT LONG TERM GOAL #1   Title  Patient demonstrates & verbalizes understanding of prosthetic care to enable safe use of prosthesis. (All LTGs Target Date: 05/25/2018)    Time  12    Period  Weeks    Status  On-going    Target Date  05/25/18      PT LONG TERM GOAL #2   Title  Patient tolerates wear of prosthesis >90% of awake hours without skin or limb pain issues to enable function throughout his day.     Time  12     Period  Weeks    Status  On-going    Target Date  05/25/18  PT LONG TERM GOAL #3   Title  Patient ambulates >1000' outdoors including grass, ramps & curbs with prosthesis only independently for community mobility.     Time  12    Period  Weeks    Status  On-going    Target Date  05/25/18      PT LONG TERM GOAL #4   Title  Berg Balance >45/56 to indicate lower fall risk.    Time  12    Period  Weeks    Status  On-going    Target Date  05/25/18      PT LONG TERM GOAL #5   Title  Patient perform floor transfers, lifts & carries 25# and push/pull to enable to return to yard work & other patient goals.     Time  12    Period  Weeks    Status  On-going    Target Date  05/25/18            Plan - 04/25/18 2956    Clinical Impression Statement  Patient has better understanding of fitness plan with use of Treadmill & weight machines after today's skilled instruction.     Rehab Potential  Good    PT Frequency  2x / week    PT Duration  12 weeks    PT Treatment/Interventions  ADLs/Self Care Home Management;Gait training;Stair training;Functional mobility training;Therapeutic activities;Therapeutic exercise;DME Instruction;Balance training;Neuromuscular re-education;Patient/family education;Prosthetic Training;Vestibular;Manual techniques    PT Next Visit Plan  stairs with reciprocal pattern, balance activities including rocker board & foam beam to facilitate ankle, hip & step strategies & LLE SLS exercises, L hip extension    Consulted and Agree with Plan of Care  Patient       Patient will benefit from skilled therapeutic intervention in order to improve the following deficits and impairments:  Abnormal gait, Decreased activity tolerance, Decreased balance, Decreased endurance, Decreased knowledge of use of DME, Decreased mobility, Decreased strength, Dizziness, Impaired flexibility, Postural dysfunction, Prosthetic Dependency  Visit Diagnosis: Muscle weakness  (generalized)  Other abnormalities of gait and mobility  Unsteadiness on feet  Abnormal posture     Problem List Patient Active Problem List   Diagnosis Date Noted  . Labile blood glucose   . Acute blood loss anemia   . Labile blood pressure   . Stage 3 chronic kidney disease ([place])   . Type 2 diabetes mellitus with peripheral neuropathy (HCC)   . Post-operative pain   . Amputation of left lower extremity below knee upon examination ([place]) 11/24/2017  . Amputated below knee, left ([place]) 11/22/2017  . Osteomyelitis of left foot ([place])   . Dehiscence of amputation stump ([place]) 08/11/2017  . S/P transmetatarsal amputation of foot, left ([place]) 07/19/2017  . Subacute osteomyelitis, left ankle and foot ([place]) 07/17/2017  . Diabetic ulcer of left foot associated with type 2 diabetes mellitus ([place])   . Diabetes mellitus with renal complications ([place]) 21/30/8657  . Diabetes ([place]) 10/13/2014  . Anemia 10/13/2014  . CKD (chronic kidney disease) stage 3, GFR 30-59 ml/min (HCC) 10/13/2014  . Hyperkalemia 10/13/2014    Raileigh Sabater PT, DPT 04/25/2018, 9:42 AM  [facility] [address] [place], [place], [address] Phone: [phone]   Fax:  [phone]  Name: Kevin Bradford MRN: [id number] Date of Birth: 11-23-1945

## 2018-04-30 ENCOUNTER — Encounter: Payer: Self-pay | Admitting: Physical Therapy

## 2018-04-30 ENCOUNTER — Ambulatory Visit: Payer: Medicare Other | Admitting: Physical Therapy

## 2018-04-30 DIAGNOSIS — M6281 Muscle weakness (generalized): Secondary | ICD-10-CM

## 2018-04-30 DIAGNOSIS — R2689 Other abnormalities of gait and mobility: Secondary | ICD-10-CM | POA: Diagnosis not present

## 2018-04-30 DIAGNOSIS — R2681 Unsteadiness on feet: Secondary | ICD-10-CM

## 2018-04-30 DIAGNOSIS — Z9181 History of falling: Secondary | ICD-10-CM | POA: Diagnosis not present

## 2018-04-30 DIAGNOSIS — R293 Abnormal posture: Secondary | ICD-10-CM | POA: Diagnosis not present

## 2018-05-01 NOTE — Therapy (Signed)
[facility] [address] [place], [place], [address] Phone: [phone]   Fax:  [phone]  Physical Therapy Treatment  Patient Details  Name: Kevin Bradford MRN: [id number] Date of Birth: 1946/02/11 Referring Provider (PT): Meridee Score, MD   Encounter Date: 04/30/2018  PT End of Session - 04/30/18 0853    Visit Number  16    Number of Visits  25    Date for PT Re-Evaluation  05/30/18    Authorization Type  Medicare & AETNA Medicare 80% AETNA covers 20% once $300 deductible (at eval $91.17 met)    PT Start Time  0849    PT Stop Time  0930    PT Time Calculation (min)  41 min    Equipment Utilized During Treatment  Gait belt    Activity Tolerance  Patient tolerated treatment well;No increased pain    Behavior During Therapy  WFL for tasks assessed/performed       Past Medical History:  Diagnosis Date  . Arthritis    "joint pain" (08/16/2017)  . Cellulitis and abscess of leg 10/18/2014  . CKD (chronic kidney disease) stage 3, GFR 30-59 ml/min (HCC)    creatinine increases with antibiotics per pt, no known kidney disease per patient  . Dehiscence of closure of skin, subsequent encounter   . Diabetic foot ulcer ([place])    left  . Diabetic peripheral neuropathy ([place])   . GERD (gastroesophageal reflux disease)    12/26/2017- "not this year."   . Osteomyelitis ([place])    left transmetatarsal   . Type II diabetes mellitus ([place])     Past Surgical History:  Procedure Laterality Date  . AMPUTATION Left 07/19/2017   Procedure: LEFT TRANSMETATARSAL AMPUTATION;  Surgeon: Newt Minion, MD;  Location: [facility];  Service: Orthopedics;  Laterality: Left;  . AMPUTATION Left 08/16/2017   REVISION LEFT TRANSMETATARSAL AMPUTATION  . AMPUTATION Left 11/22/2017   Procedure: LEFT BELOW KNEE AMPUTATION;  Surgeon: Newt Minion, MD;  Location: [facility];  Service: Orthopedics;  Laterality: Left;  . CATARACT EXTRACTION W/ INTRAOCULAR LENS   IMPLANT, BILATERAL  ~ 2016  . EYE SURGERY Bilateral    catarct  . I&D EXTREMITY Left 10/17/2014   Procedure: IRRIGATION AND DEBRIDEMENT EXTREMITY LEFT FOOT;  Surgeon: Newt Minion, MD;  Location: [facility];  Service: Orthopedics;  Laterality: Left;  . STUMP REVISION Left 08/16/2017   Procedure: REVISION LEFT TRANSMETATARSAL AMPUTATION;  Surgeon: Newt Minion, MD;  Location: [facility];  Service: Orthopedics;  Laterality: Left;  . STUMP REVISION Left 12/27/2017   Procedure: LEFT BELOW KNEE AMPUTATION REVISION;  Surgeon: Newt Minion, MD;  Location: [facility];  Service: Orthopedics;  Laterality: Left;  . TOE AMPUTATION Right 2013   5th toe    There were no vitals filed for this visit.  Subjective Assessment - 04/30/18 0852    Subjective  No new complaints. No falls or pain to report.     Pertinent History  L TTA, DM, CKD3, PVD, arthritis, peripheral neuropathy    Limitations  Lifting;Standing;Walking;House hold activities    Patient Stated Goals  To use prosthesis to be active fish, yard work, play with grandkids, ride bicycle. Swim in pool.     Currently in Pain?  No/denies           [facility] Adult PT Treatment/Exercise - 04/30/18 0853      Transfers   Transfers  Sit to Stand;Stand to Sit    Sit to Stand  7: Independent    Stand to Sit  7: Independent      Ambulation/Gait   Ambulation/Gait  Yes    Ambulation/Gait Assistance  6: Modified independent (Device/Increase time)    Ambulation/Gait Assistance Details  does tend to demo an antalgic gait when fatigued needing cues for stance time and step length. improves with rest breaks.     Ambulation Distance (Feet)  --   around gym with activities   Assistive device  Prosthesis;None    Gait Pattern  Step-through pattern;Decreased arm swing - left;Decreased step length - right;Decreased stance time - left;Decreased stride length;Decreased hip/knee flexion - left;Decreased weight shift to left;Left circumduction;Left hip hike;Antalgic;Lateral hip  instability;Trunk flexed;Abducted - left;Poor foot clearance - left    Ambulation Surface  Level;Indoor    Stairs  Yes    Stairs Assistance  5: Supervision    Stairs Assistance Details (indicate cue type and reason)  cues for prosthetic foot placement and increased stepping fwd with lowering of prosthesis to achieve good foot position with descending stairs reciprocally.     Stair Management Technique  One rail Left;Alternating pattern;Forwards    Number of Stairs  4   x5 reps   Height of Stairs  6      High Level Balance   High Level Balance Activities  Marching forwards;Marching backwards;Tandem walking   tandem fwd/bwd   High Level Balance Comments  on both red mats next to counter: 3 laps each with intermittent to light UE support on counter for balance, cues on posture and weight shifting. min guard to min assist for balance.       Prosthetics   Current prosthetic wear tolerance (days/week)   daily    Current prosthetic wear tolerance (#hours/day)   most awake hours, drying every 3-4 hours    Residual limb condition   intact per pt report    Donning Prosthesis  Supervision    Doffing Prosthesis  Modified independent (device/increased time)          Balance Exercises - 04/30/18 0905      Balance Exercises: Standing   Rockerboard  Anterior/posterior;Lateral;Head turns;EO;EC;30 seconds;10 reps    Step Over Hurdles / Cones  hurdles of varied heights over both red mats: reciprocal stepping over hurdles x 6 laps forward with single UE support porgressing to occasional touch to counter. min guard to min assist for balance with cues for weight shifting and increased hip/knee flexion to clear hurdle.                       Balance Exercises: Standing   Rebounder Limitations  performed both ways on  balance board holding it steady: alternating raising UE's, progressing to bil UE raises with cues on posture and weight shifitng to assist with balance; then EC no head movements, progressing  to EC head movements left<>right, then up<>down with min assist, min guard at times. cues on weight shifting to assist with balance recovery.           PT Short Term Goals - 04/23/18 0850      PT SHORT TERM GOAL #1   Title  Patient verbalizes how to properly change shoes on prosthesis. (All STGs Target Date: 04/26/18)    Baseline  MET 04/18/2018    Time  4    Period  Weeks    Status  Achieved      PT SHORT TERM GOAL #2   Title  Patient verbalizes how to mount [employer]  and start /stop on his bicycle.     Baseline  MET 04/18/2018    Time  4    Period  Weeks    Status  Achieved      PT SHORT TERM GOAL #3   Title  Patient ambulates 54' with prosthesis only  with supervision/cues to improve deviations.     Baseline  met 04/23/18    Time  4    Period  Weeks    Status  Achieved      PT SHORT TERM GOAL #4   Title  Patient negotiates ramps, curbs and stairs with 1 rail with prosthesis only with supervision.     Baseline  met 04/23/18    Time  4    Period  Weeks    Status  Achieved      PT SHORT TERM GOAL #5   Title  Patient able to pick up & carry 20# box with cues.     Baseline  met 04/23/18 (per pt report)    Time  4    Period  Weeks    Status  Achieved        PT Long Term Goals - 03/07/18 0934      PT LONG TERM GOAL #1   Title  Patient demonstrates & verbalizes understanding of prosthetic care to enable safe use of prosthesis. (All LTGs Target Date: 05/25/2018)    Time  12    Period  Weeks    Status  On-going    Target Date  05/25/18      PT LONG TERM GOAL #2   Title  Patient tolerates wear of prosthesis >90% of awake hours without skin or limb pain issues to enable function throughout his day.     Time  12    Period  Weeks    Status  On-going    Target Date  05/25/18      PT LONG TERM GOAL #3   Title  Patient ambulates >1000' outdoors including grass, ramps & curbs with prosthesis only independently for community mobility.     Time  12    Period  Weeks     Status  On-going    Target Date  05/25/18      PT LONG TERM GOAL #4   Title  Berg Balance >45/56 to indicate lower fall risk.    Time  12    Period  Weeks    Status  On-going    Target Date  05/25/18      PT LONG TERM GOAL #5   Title  Patient perform floor transfers, lifts & carries 25# and push/pull to enable to return to yard work & other patient goals.     Time  12    Period  Weeks    Status  On-going    Target Date  05/25/18            Plan - 04/30/18 0853    Clinical Impression Statement  Today's skilled session continued to focus on gait with prosthesis only between balance activities. The pt does report increased calf cramping/LE fatigue with high level balance activities creating antalgic gait that is relieved with seated rest breaks. The pt is progressing toward goals and should benefit from continued PT to progress toward unmet goals.     Rehab Potential  Good    PT Frequency  2x / week    PT Duration  12 weeks    PT Treatment/Interventions  ADLs/Self Care Home  Management;Gait training;Stair training;Functional mobility training;Therapeutic activities;Therapeutic exercise;DME Instruction;Balance training;Neuromuscular re-education;Patient/family education;Prosthetic Training;Vestibular;Manual techniques    PT Next Visit Plan  review treadmill as needed (he is to try his son's before next session); continue with high level balance activities including rocker board & foam beam to facilitate ankle, hip & step strategies & LLE SLS exercises, L hip extension    Consulted and Agree with Plan of Care  Patient;Family member/caregiver    Family Member Consulted  wife, Adron Bene       Patient will benefit from skilled therapeutic intervention in order to improve the following deficits and impairments:  Abnormal gait, Decreased activity tolerance, Decreased balance, Decreased endurance, Decreased knowledge of use of DME, Decreased mobility, Decreased strength, Dizziness,  Impaired flexibility, Postural dysfunction, Prosthetic Dependency  Visit Diagnosis: Muscle weakness (generalized)  Other abnormalities of gait and mobility  Unsteadiness on feet  Abnormal posture     Problem List Patient Active Problem List   Diagnosis Date Noted  . Labile blood glucose   . Acute blood loss anemia   . Labile blood pressure   . Stage 3 chronic kidney disease ([place])   . Type 2 diabetes mellitus with peripheral neuropathy (HCC)   . Post-operative pain   . Amputation of left lower extremity below knee upon examination ([place]) 11/24/2017  . Amputated below knee, left ([place]) 11/22/2017  . Osteomyelitis of left foot ([place])   . Dehiscence of amputation stump ([place]) 08/11/2017  . S/P transmetatarsal amputation of foot, left ([place]) 07/19/2017  . Subacute osteomyelitis, left ankle and foot ([place]) 07/17/2017  . Diabetic ulcer of left foot associated with type 2 diabetes mellitus ([place])   . Diabetes mellitus with renal complications ([place]) 16/38/4665  . Diabetes ([place]) 10/13/2014  . Anemia 10/13/2014  . CKD (chronic kidney disease) stage 3, GFR 30-59 ml/min (HCC) 10/13/2014  . Hyperkalemia 10/13/2014    Willow Ora, PTA, [facility] [address] [place], [place] [address] [phone] 05/01/18, 9:21 AM   Name: Sehaj Mcenroe MRN: [id number] Date of Birth: 11-17-1945

## 2018-05-02 ENCOUNTER — Ambulatory Visit: Payer: Medicare Other

## 2018-05-04 ENCOUNTER — Encounter: Payer: Self-pay | Admitting: Physical Therapy

## 2018-05-04 ENCOUNTER — Ambulatory Visit: Payer: Medicare Other | Admitting: Physical Therapy

## 2018-05-04 DIAGNOSIS — I639 Cerebral infarction, unspecified: Secondary | ICD-10-CM | POA: Diagnosis not present

## 2018-05-04 DIAGNOSIS — R2681 Unsteadiness on feet: Secondary | ICD-10-CM

## 2018-05-04 DIAGNOSIS — I951 Orthostatic hypotension: Secondary | ICD-10-CM | POA: Diagnosis not present

## 2018-05-04 DIAGNOSIS — N183 Chronic kidney disease, stage 3 (moderate): Secondary | ICD-10-CM | POA: Diagnosis not present

## 2018-05-04 DIAGNOSIS — R55 Syncope and collapse: Secondary | ICD-10-CM | POA: Diagnosis not present

## 2018-05-04 DIAGNOSIS — Z23 Encounter for immunization: Secondary | ICD-10-CM | POA: Diagnosis not present

## 2018-05-04 DIAGNOSIS — Z89512 Acquired absence of left leg below knee: Secondary | ICD-10-CM | POA: Diagnosis not present

## 2018-05-04 DIAGNOSIS — M6281 Muscle weakness (generalized): Secondary | ICD-10-CM

## 2018-05-04 DIAGNOSIS — R293 Abnormal posture: Secondary | ICD-10-CM

## 2018-05-04 DIAGNOSIS — R2689 Other abnormalities of gait and mobility: Secondary | ICD-10-CM

## 2018-05-04 DIAGNOSIS — R001 Bradycardia, unspecified: Secondary | ICD-10-CM | POA: Diagnosis not present

## 2018-05-04 DIAGNOSIS — E1122 Type 2 diabetes mellitus with diabetic chronic kidney disease: Secondary | ICD-10-CM | POA: Diagnosis not present

## 2018-05-04 DIAGNOSIS — R29700 NIHSS score 0: Secondary | ICD-10-CM | POA: Diagnosis not present

## 2018-05-04 NOTE — Therapy (Signed)
[facility] [address] [place], [place], [address] Phone: [phone]   Fax:  [phone]  Physical Therapy Treatment  Patient Details  Name: Kevin Bradford MRN: [id number] Date of Birth: 06/01/1946 Referring Provider (PT): Meridee Score, MD   Encounter Date: 05/04/2018  PT End of Session - 05/04/18 1252    Visit Number  17    Number of Visits  25    Date for PT Re-Evaluation  05/30/18    Authorization Type  Medicare & AETNA Medicare 80% AETNA covers 20% once $300 deductible (at eval $91.17 met)    PT Start Time  1149    PT Stop Time  1230    PT Time Calculation (min)  41 min    Activity Tolerance  Patient tolerated treatment well    Behavior During Therapy  [facility] for tasks assessed/performed       Past Medical History:  Diagnosis Date  . Arthritis    "joint pain" (08/16/2017)  . Cellulitis and abscess of leg 10/18/2014  . CKD (chronic kidney disease) stage 3, GFR 30-59 ml/min (HCC)    creatinine increases with antibiotics per pt, no known kidney disease per patient  . Dehiscence of closure of skin, subsequent encounter   . Diabetic foot ulcer ([place])    left  . Diabetic peripheral neuropathy ([place])   . GERD (gastroesophageal reflux disease)    12/26/2017- "not this year."   . Osteomyelitis ([place])    left transmetatarsal   . Type II diabetes mellitus ([place])     Past Surgical History:  Procedure Laterality Date  . AMPUTATION Left 07/19/2017   Procedure: LEFT TRANSMETATARSAL AMPUTATION;  Surgeon: Newt Minion, MD;  Location: [facility];  Service: Orthopedics;  Laterality: Left;  . AMPUTATION Left 08/16/2017   REVISION LEFT TRANSMETATARSAL AMPUTATION  . AMPUTATION Left 11/22/2017   Procedure: LEFT BELOW KNEE AMPUTATION;  Surgeon: Newt Minion, MD;  Location: [facility];  Service: Orthopedics;  Laterality: Left;  . CATARACT EXTRACTION W/ INTRAOCULAR LENS  IMPLANT, BILATERAL  ~ 2016  . EYE SURGERY Bilateral    catarct  . I&D  EXTREMITY Left 10/17/2014   Procedure: IRRIGATION AND DEBRIDEMENT EXTREMITY LEFT FOOT;  Surgeon: Newt Minion, MD;  Location: [facility];  Service: Orthopedics;  Laterality: Left;  . STUMP REVISION Left 08/16/2017   Procedure: REVISION LEFT TRANSMETATARSAL AMPUTATION;  Surgeon: Newt Minion, MD;  Location: [facility];  Service: Orthopedics;  Laterality: Left;  . STUMP REVISION Left 12/27/2017   Procedure: LEFT BELOW KNEE AMPUTATION REVISION;  Surgeon: Newt Minion, MD;  Location: [facility];  Service: Orthopedics;  Laterality: Left;  . TOE AMPUTATION Right 2013   5th toe    There were no vitals filed for this visit.  Subjective Assessment - 05/04/18 1153    Subjective  Feels that the LLE may be slightly longer than R; tried walking on a surface slightly higher and he felt more level and fluid.  Is going to f/u with Gerald Stabs at [facility].  Did not try treadmill yet.    Pertinent History  L TTA, DM, CKD3, PVD, arthritis, peripheral neuropathy    Limitations  Lifting;Standing;Walking;House hold activities    Patient Stated Goals  To use prosthesis to be active fish, yard work, play with grandkids, ride bicycle. Swim in pool.     Currently in Pain?  No/denies  [facility] Adult PT Treatment/Exercise - 05/04/18 1154      Ambulation/Gait   Ambulation/Gait  Yes    Ambulation/Gait Assistance  5: Supervision    Ambulation/Gait Assistance Details  outside over pavement and uneven grassy terrain, uphill/downhill with close supervision.  Pt demonstrated decreased stance time on LLE and return to step to gait sequence on more uneven surfaces; pt also demonstrates more ABD LLE    Ambulation Distance (Feet)  1200 Feet    Assistive device  Prosthesis;None    Gait Pattern  Step-through pattern;Step-to pattern;Decreased step length - right;Decreased stance time - left;Decreased stride length;Decreased hip/knee flexion - left;Decreased weight shift to left;Wide base of support;Abducted - left     Ambulation Surface  Unlevel;Outdoor;Paved;Grass    Curb  5: Supervision;4: Min assist    Curb Details (indicate cue type and reason)  curb outside; supervision when descending with LLE and ascending with R/LLE.  Verbal cues to make technique more fluid.  Also required min A and verbal cues to sequence descending with RLE hanging L toe over edge of curb before stepping down.      Exercises   Exercises  Knee/Hip      Knee/Hip Exercises: Aerobic   Tread Mill  1.0 mph x 6 minutes with bilat UE support with verbal cues for full weight shift to RLE and activation of hip ABD to improve LLE step length and foot clearance.  As pt fatigued he demonstrated increased trunk flexion      Knee/Hip Exercises: Standing   Hip Abduction  Stengthening;Right;Left;1 set;10 reps;Knee straight    Abduction Limitations  green theraband around ankles    Hip Extension  Left;Right;Stengthening;1 set;10 reps;Knee straight    Extension Limitations  green theraband around ankles             PT Education - 05/04/18 1252    Education Details  added standing hip ABD and extension strengthening to HEP with resistance    Person(s) Educated  Patient    Methods  Explanation;Demonstration;Handout    Comprehension  Verbalized understanding;Returned demonstration       PT Short Term Goals - 04/23/18 0850      PT SHORT TERM GOAL #1   Title  Patient verbalizes how to properly change shoes on prosthesis. (All STGs Target Date: 04/26/18)    Baseline  MET 04/18/2018    Time  4    Period  Weeks    Status  Achieved      PT SHORT TERM GOAL #2   Title  Patient verbalizes how to mount /dismount and start /stop on his bicycle.     Baseline  MET 04/18/2018    Time  4    Period  Weeks    Status  Achieved      PT SHORT TERM GOAL #3   Title  Patient ambulates 80' with prosthesis only  with supervision/cues to improve deviations.     Baseline  met 04/23/18    Time  4    Period  Weeks    Status  Achieved      PT SHORT  TERM GOAL #4   Title  Patient negotiates ramps, curbs and stairs with 1 rail with prosthesis only with supervision.     Baseline  met 04/23/18    Time  4    Period  Weeks    Status  Achieved      PT SHORT TERM GOAL #5   Title  Patient able to pick up & carry 20#  box with cues.     Baseline  met 04/23/18 (per pt report)    Time  4    Period  Weeks    Status  Achieved        PT Long Term Goals - 03/07/18 0934      PT LONG TERM GOAL #1   Title  Patient demonstrates & verbalizes understanding of prosthetic care to enable safe use of prosthesis. (All LTGs Target Date: 05/25/2018)    Time  12    Period  Weeks    Status  On-going    Target Date  05/25/18      PT LONG TERM GOAL #2   Title  Patient tolerates wear of prosthesis >90% of awake hours without skin or limb pain issues to enable function throughout his day.     Time  12    Period  Weeks    Status  On-going    Target Date  05/25/18      PT LONG TERM GOAL #3   Title  Patient ambulates >1000' outdoors including grass, ramps & curbs with prosthesis only independently for community mobility.     Time  12    Period  Weeks    Status  On-going    Target Date  05/25/18      PT LONG TERM GOAL #4   Title  Berg Balance >45/56 to indicate lower fall risk.    Time  12    Period  Weeks    Status  On-going    Target Date  05/25/18      PT LONG TERM GOAL #5   Title  Patient perform floor transfers, lifts & carries 25# and push/pull to enable to return to yard work & other patient goals.     Time  12    Period  Weeks    Status  On-going    Target Date  05/25/18            Plan - 05/04/18 1254    Clinical Impression Statement  Continued higher level gait training over outdoor surfaces including hills, uneven grassy terrain, curbs, stepping over large obstacles and increasing gait speed.  Also continued to review use of treadmill for home use focusing on posture and improving weight shift and foot clearance so as not to  reinforce poor gait mechanics.  Introduced standing hip ABD and extension strengthening with resistance and single limb stance training.  Pt tolerated well without increase in pain.  Will continue to address in order to progress towards LTG.    Rehab Potential  Good    PT Frequency  2x / week    PT Duration  12 weeks    PT Treatment/Interventions  ADLs/Self Care Home Management;Gait training;Stair training;Functional mobility training;Therapeutic activities;Therapeutic exercise;DME Instruction;Balance training;Neuromuscular re-education;Patient/family education;Prosthetic Training;Vestibular;Manual techniques    PT Next Visit Plan  high level gait training outside over uneven surfaces, making curbs more fluid and automatic stepping down, carrying weighted objects over level surfaces and compliant; continue with high level balance activities including rocker board & foam beam to facilitate ankle, hip & step strategies & LLE SLS exercises, L hip extension    Consulted and Agree with Plan of Care  Patient       Patient will benefit from skilled therapeutic intervention in order to improve the following deficits and impairments:  Abnormal gait, Decreased activity tolerance, Decreased balance, Decreased endurance, Decreased knowledge of use of DME, Decreased mobility, Decreased strength, Dizziness, Impaired flexibility, Postural dysfunction,  Prosthetic Dependency  Visit Diagnosis: Muscle weakness (generalized)  Other abnormalities of gait and mobility  Unsteadiness on feet  Abnormal posture     Problem List Patient Active Problem List   Diagnosis Date Noted  . Labile blood glucose   . Acute blood loss anemia   . Labile blood pressure   . Stage 3 chronic kidney disease ([place])   . Type 2 diabetes mellitus with peripheral neuropathy (HCC)   . Post-operative pain   . Amputation of left lower extremity below knee upon examination ([place]) 11/24/2017  . Amputated below knee, left ([place]) 11/22/2017   . Osteomyelitis of left foot ([place])   . Dehiscence of amputation stump ([place]) 08/11/2017  . S/P transmetatarsal amputation of foot, left ([place]) 07/19/2017  . Subacute osteomyelitis, left ankle and foot ([place]) 07/17/2017  . Diabetic ulcer of left foot associated with type 2 diabetes mellitus ([place])   . Diabetes mellitus with renal complications ([place]) 22/17/9810  . Diabetes ([place]) 10/13/2014  . Anemia 10/13/2014  . CKD (chronic kidney disease) stage 3, GFR 30-59 ml/min (HCC) 10/13/2014  . Hyperkalemia 10/13/2014    Rico Junker, PT, DPT 05/04/18    1:01 PM    [facility] [address] [place], [place], [address] Phone: [phone]   Fax:  [phone]  Name: Esli Jernigan MRN: [id number] Date of Birth: 1946/05/03

## 2018-05-04 NOTE — Patient Instructions (Signed)
Do each exercise 2  times per day Do each exercise 10 repetitions Hold each exercise for 5 seconds to feel your location  AT SINK FIND YOUR MIDLINE POSITION AND PLACE FEET EQUAL DISTANCE FROM THE MIDLINE.  USE TAPE ON FLOOR TO MARK THE MIDLINE POSITION. You also should try to feel with your limb pressure in socket.  You are trying to feel with limb what you used to feel with the bottom of your foot.  1. Side to Side Shift: Moving your hips only (not shoulders): move weight onto your left leg, HOLD/FEEL.  Move back to equal weight on each leg, HOLD/FEEL. Move weight onto your right leg, HOLD/FEEL. Move back to equal weight on each leg, HOLD/FEEL. Repeat. 2. Front to Back Shift: Moving your hips only (not shoulders): move your weight forward onto your toes, HOLD/FEEL. Move your weight back to equal Flat Foot on both legs, HOLD/FEEL. Move your weight back onto your heels, HOLD/FEEL. Move your weight back to equal on both legs, HOLD/FEEL. Repeat. 3. Moving Cones / Cups: With equal weight on each leg: Hold on with one hand the first time, then progress to no hand supports. Move cups from one side of sink to the other. Place cups ~2" out of your reach, progress to 10" beyond reach. 4. Overhead/Upward Reaching: alternated reaching up to top cabinets or ceiling if no cabinets present. Keep equal weight on each leg. Start with one hand support on counter while other hand reaches and progress to no hand support with reaching. 5.   Looking Over Shoulders: With equal weight on each leg: alternate turning to look over your shoulders with one hand support on counter as needed. Shift weight to side looking, pull hip        then shoulder then head/eyes around to look behind you. Start with one hand support & progress to no hand support.Bridging (Single Leg)    Lie on back with feet shoulder width apart and right leg straight. Lift hips toward the ceiling while keeping leg straight. Hold __2-3__ seconds. Repeat  __5__ times, rest then do 5 more reps. Do _2___ sessions per day.  http://gt2.exer.us/358   Copyright  VHI. All rights reserved.   Hip Flexor Stretch    Lying on back near edge of bed, bend one leg, foot flat. Hang other leg over edge, relaxed, thigh resting entirely on bed for __1-2__ minutes. Repeat _1-2___ times. Do _2___ sessions per day. Advanced Exercise: Bend knee back keeping thigh in contact with bed.  http://gt2.exer.us/346   Copyright  VHI. All rights reserved.      Setup  Begin in a standing upright position with your hands resting on a counter in front of you and a resistance band looped around your ankles. Movement  Lift one leg straight out to your side, pushing against the resistance. Hold briefly, then slowly return to the starting position and repeat. Tip  Make sure to maintain an upright posture and use the counter to help you balance as needed.  2 sets x 10 repetitions each side        Setup  Begin in a standing upright position with your hands resting on a counter in front of you and a resistance band looped around your ankles. Movement  Lift one leg straight backward, pushing against the resistance. Hold briefly, then slowly return to the starting position and repeat. Tip  Make sure to maintain an upright posture and use the counter to help you balance as needed. 2  sets x 10 reps each leg

## 2018-05-06 ENCOUNTER — Inpatient Hospital Stay (HOSPITAL_COMMUNITY)
Admission: EM | Admit: 2018-05-06 | Discharge: 2018-05-08 | DRG: 066 | Disposition: A | Discharge status: Home / Self Care | Payer: Medicare Other | Attending: Internal Medicine | Admitting: Internal Medicine

## 2018-05-06 ENCOUNTER — Other Ambulatory Visit: Payer: Self-pay

## 2018-05-06 ENCOUNTER — Emergency Department (HOSPITAL_COMMUNITY): Payer: Medicare Other

## 2018-05-06 ENCOUNTER — Encounter (HOSPITAL_COMMUNITY): Payer: Self-pay

## 2018-05-06 DIAGNOSIS — N183 Chronic kidney disease, stage 3 unspecified: Secondary | ICD-10-CM | POA: Diagnosis present

## 2018-05-06 DIAGNOSIS — R55 Syncope and collapse: Secondary | ICD-10-CM

## 2018-05-06 DIAGNOSIS — S88112A Complete traumatic amputation at level between knee and ankle, left lower leg, initial encounter: Secondary | ICD-10-CM | POA: Diagnosis present

## 2018-05-06 DIAGNOSIS — I129 Hypertensive chronic kidney disease with stage 1 through stage 4 chronic kidney disease, or unspecified chronic kidney disease: Secondary | ICD-10-CM | POA: Diagnosis present

## 2018-05-06 DIAGNOSIS — Z6827 Body mass index (BMI) 27.0-27.9, adult: Secondary | ICD-10-CM

## 2018-05-06 DIAGNOSIS — I6509 Occlusion and stenosis of unspecified vertebral artery: Secondary | ICD-10-CM | POA: Diagnosis present

## 2018-05-06 DIAGNOSIS — E1122 Type 2 diabetes mellitus with diabetic chronic kidney disease: Secondary | ICD-10-CM | POA: Diagnosis present

## 2018-05-06 DIAGNOSIS — I951 Orthostatic hypotension: Secondary | ICD-10-CM | POA: Diagnosis not present

## 2018-05-06 DIAGNOSIS — I639 Cerebral infarction, unspecified: Principal | ICD-10-CM | POA: Diagnosis present

## 2018-05-06 DIAGNOSIS — E785 Hyperlipidemia, unspecified: Secondary | ICD-10-CM | POA: Diagnosis present

## 2018-05-06 DIAGNOSIS — E663 Overweight: Secondary | ICD-10-CM | POA: Diagnosis present

## 2018-05-06 DIAGNOSIS — Z794 Long term (current) use of insulin: Secondary | ICD-10-CM

## 2018-05-06 DIAGNOSIS — E119 Type 2 diabetes mellitus without complications: Secondary | ICD-10-CM

## 2018-05-06 DIAGNOSIS — D631 Anemia in chronic kidney disease: Secondary | ICD-10-CM | POA: Diagnosis present

## 2018-05-06 DIAGNOSIS — Z7982 Long term (current) use of aspirin: Secondary | ICD-10-CM

## 2018-05-06 DIAGNOSIS — E1142 Type 2 diabetes mellitus with diabetic polyneuropathy: Secondary | ICD-10-CM | POA: Diagnosis present

## 2018-05-06 DIAGNOSIS — R42 Dizziness and giddiness: Secondary | ICD-10-CM

## 2018-05-06 DIAGNOSIS — Z23 Encounter for immunization: Secondary | ICD-10-CM

## 2018-05-06 DIAGNOSIS — R29700 NIHSS score 0: Secondary | ICD-10-CM | POA: Diagnosis present

## 2018-05-06 DIAGNOSIS — R29810 Facial weakness: Secondary | ICD-10-CM | POA: Diagnosis present

## 2018-05-06 DIAGNOSIS — R001 Bradycardia, unspecified: Secondary | ICD-10-CM | POA: Diagnosis not present

## 2018-05-06 DIAGNOSIS — N182 Chronic kidney disease, stage 2 (mild): Secondary | ICD-10-CM | POA: Diagnosis present

## 2018-05-06 DIAGNOSIS — K219 Gastro-esophageal reflux disease without esophagitis: Secondary | ICD-10-CM | POA: Diagnosis present

## 2018-05-06 DIAGNOSIS — Z79899 Other long term (current) drug therapy: Secondary | ICD-10-CM

## 2018-05-06 DIAGNOSIS — Z89512 Acquired absence of left leg below knee: Secondary | ICD-10-CM

## 2018-05-06 DIAGNOSIS — R110 Nausea: Secondary | ICD-10-CM | POA: Diagnosis not present

## 2018-05-06 HISTORY — DX: Cerebral infarction, unspecified: I63.9

## 2018-05-06 LAB — GLUCOSE, CAPILLARY: Glucose-Capillary: 247 mg/dL — ABNORMAL HIGH (ref 70–99)

## 2018-05-06 LAB — CBC WITH DIFFERENTIAL/PLATELET
ABS IMMATURE GRANULOCYTES: 0.06 10*3/uL (ref 0.00–0.07)
Basophils Absolute: 0 10*3/uL (ref 0.0–0.1)
Basophils Relative: 1 %
Eosinophils Absolute: 0 10*3/uL (ref 0.0–0.5)
Eosinophils Relative: 0 %
HCT: 37.6 % — ABNORMAL LOW (ref 39.0–52.0)
HEMOGLOBIN: 12.1 g/dL — AB (ref 13.0–17.0)
IMMATURE GRANULOCYTES: 1 %
LYMPHS ABS: 1.2 10*3/uL (ref 0.7–4.0)
LYMPHS PCT: 15 %
MCH: 28.5 pg (ref 26.0–34.0)
MCHC: 32.2 g/dL (ref 30.0–36.0)
MCV: 88.7 fL (ref 80.0–100.0)
MONO ABS: 0.5 10*3/uL (ref 0.1–1.0)
MONOS PCT: 6 %
NEUTROS ABS: 6.2 10*3/uL (ref 1.7–7.7)
NEUTROS PCT: 77 %
PLATELETS: 167 10*3/uL (ref 150–400)
RBC: 4.24 MIL/uL (ref 4.22–5.81)
RDW: 14 % (ref 11.5–15.5)
WBC: 8 10*3/uL (ref 4.0–10.5)
nRBC: 0 % (ref 0.0–0.2)

## 2018-05-06 LAB — I-STAT TROPONIN, ED
Troponin i, poc: 0 ng/mL (ref 0.00–0.08)
Troponin i, poc: 0 ng/mL (ref 0.00–0.08)

## 2018-05-06 LAB — COMPREHENSIVE METABOLIC PANEL
ALK PHOS: 80 U/L (ref 38–126)
ALT: 15 U/L (ref 0–44)
AST: 19 U/L (ref 15–41)
Albumin: 3.9 g/dL (ref 3.5–5.0)
Anion gap: 6 (ref 5–15)
BUN: 24 mg/dL — AB (ref 8–23)
CHLORIDE: 109 mmol/L (ref 98–111)
CO2: 22 mmol/L (ref 22–32)
CREATININE: 1.31 mg/dL — AB (ref 0.61–1.24)
Calcium: 9 mg/dL (ref 8.9–10.3)
GFR, EST NON AFRICAN AMERICAN: 53 mL/min — AB (ref >60)
Glucose, Bld: 211 mg/dL — ABNORMAL HIGH (ref 70–99)
Potassium: 4.2 mmol/L (ref 3.5–5.1)
SODIUM: 137 mmol/L (ref 135–145)
Total Bilirubin: 0.9 mg/dL (ref 0.3–1.2)
Total Protein: 6.3 g/dL — ABNORMAL LOW (ref 6.5–8.1)

## 2018-05-06 LAB — ETHANOL: Alcohol, Ethyl (B): <10 mg/dL (ref <10)

## 2018-05-06 MED ORDER — INSULIN ASPART 100 UNIT/ML ~~LOC~~ SOLN
0.0000 [IU] | Freq: Three times a day (TID) | SUBCUTANEOUS | Status: DC
  Administered 2018-05-07: 2 [IU] via SUBCUTANEOUS
  Administered 2018-05-07: 3 [IU] via SUBCUTANEOUS
  Administered 2018-05-07: 2 [IU] via SUBCUTANEOUS
  Administered 2018-05-08: 3 [IU] via SUBCUTANEOUS
  Administered 2018-05-08: 2 [IU] via SUBCUTANEOUS
  Filled 2018-05-06 (×2): qty 1

## 2018-05-06 MED ORDER — INSULIN ASPART 100 UNIT/ML ~~LOC~~ SOLN
4.0000 [IU] | Freq: Three times a day (TID) | SUBCUTANEOUS | Status: DC
  Administered 2018-05-07 – 2018-05-08 (×4): 4 [IU] via SUBCUTANEOUS
  Filled 2018-05-06 (×2): qty 1

## 2018-05-06 MED ORDER — ENOXAPARIN SODIUM 40 MG/0.4ML ~~LOC~~ SOLN
40.0000 mg | SUBCUTANEOUS | Status: DC
  Administered 2018-05-08: 40 mg via SUBCUTANEOUS
  Filled 2018-05-06 (×2): qty 0.4

## 2018-05-06 MED ORDER — SODIUM CHLORIDE 0.9 % IV BOLUS
1000.0000 mL | Freq: Once | INTRAVENOUS | Status: AC
Start: 2018-05-06 — End: 2018-05-06
  Administered 2018-05-06: 1000 mL via INTRAVENOUS

## 2018-05-06 MED ORDER — SODIUM CHLORIDE 0.9% FLUSH
3.0000 mL | Freq: Two times a day (BID) | INTRAVENOUS | Status: DC
  Administered 2018-05-07 – 2018-05-08 (×2): 3 mL via INTRAVENOUS

## 2018-05-06 MED ORDER — ASPIRIN EC 81 MG PO TBEC
81.0000 mg | DELAYED_RELEASE_TABLET | Freq: Every day | ORAL | Status: DC
  Administered 2018-05-07: 81 mg via ORAL
  Filled 2018-05-06: qty 1

## 2018-05-06 MED ORDER — SODIUM CHLORIDE 0.9 % IV BOLUS
1000.0000 mL | Freq: Once | INTRAVENOUS | Status: AC
  Administered 2018-05-06: 1000 mL via INTRAVENOUS

## 2018-05-06 MED ORDER — INSULIN DETEMIR 100 UNIT/ML ~~LOC~~ SOLN
30.0000 [IU] | Freq: Every day | SUBCUTANEOUS | Status: DC
  Administered 2018-05-07 – 2018-05-08 (×2): 30 [IU] via SUBCUTANEOUS
  Filled 2018-05-06 (×4): qty 0.3

## 2018-05-06 MED ORDER — ONDANSETRON HCL 4 MG/2ML IJ SOLN
4.0000 mg | Freq: Once | INTRAMUSCULAR | Status: AC
  Administered 2018-05-06: 4 mg via INTRAVENOUS
  Filled 2018-05-06: qty 2

## 2018-05-06 NOTE — ED Triage Notes (Signed)
Pt arrives to ED from home with complaints of weakness  since 3:30 this afternoon. EMS reports pt originally called out for hypoglycemia but was found to be severely orthostatic upon arrival, becoming "gray when he stands up", and reporting nausea with position change, CBG 232. Pt denies cardiac hx. Pt placed in position of comfort with bed locked and lowered, call bell in reach.

## 2018-05-06 NOTE — ED Notes (Signed)
ED Provider at bedside. 

## 2018-05-06 NOTE — H&P (Signed)
History and Physical    Kevin Bradford ZOX:[id number] DOB: 1945/12/31 DOA: 05/06/2018  PCP: Georgann Housekeeper, MD  Patient coming from: Home  I have personally briefly reviewed patient's old medical records in [facility]  Chief Complaint: Near syncope  HPI: Kevin Bradford is a 72 y.o. male with medical history significant of DM2, CKD stage 3, s/p L BKA.  Patient was in normal state of health today, doing plumbing on a ladder even.  He had been slightly more tired than usual over the past 3-4 days but nothing too significant.  3 mins to an hour after working on the ladder he developed sudden onset of nausea, lightheadedness and sweating.  Immediately felt he needed to lay down, and this made him feel better.  EMS arrived and noted he was extremely orthostatic in that he would become very symptomatic every time they tried to sit him up or stand him up.   ED Course: In the ED he feels okay laying down, but becomes symptomatic on standing.  Of note HR is in the 70s-80s while laying down, and drops to 40s on sitting.  Given IVF.  Still had lightheadedness and HR drop to 40s when going from seated to standing and trying to ambulate.   Review of Systems: As per HPI otherwise 10 point review of systems negative.   Past Medical History:  Diagnosis Date  . Arthritis    "joint pain" (08/16/2017)  . Cellulitis and abscess of leg 10/18/2014  . CKD (chronic kidney disease) stage 3, GFR 30-59 ml/min (HCC)    creatinine increases with antibiotics per pt, no known kidney disease per patient  . Dehiscence of closure of skin, subsequent encounter   . Diabetic foot ulcer (HCC)    left  . Diabetic peripheral neuropathy (HCC)   . GERD (gastroesophageal reflux disease)    12/26/2017- "not this year."   . Osteomyelitis (HCC)    left transmetatarsal   . Type II diabetes mellitus (HCC)     Past Surgical History:  Procedure Laterality Date  . AMPUTATION Left 07/19/2017   Procedure: LEFT  TRANSMETATARSAL AMPUTATION;  Surgeon: Nadara Mustard, MD;  Location: [facility] OR;  Service: Orthopedics;  Laterality: Left;  . AMPUTATION Left 08/16/2017   REVISION LEFT TRANSMETATARSAL AMPUTATION  . AMPUTATION Left 11/22/2017   Procedure: LEFT BELOW KNEE AMPUTATION;  Surgeon: Nadara Mustard, MD;  Location: [facility] OR;  Service: Orthopedics;  Laterality: Left;  . CATARACT EXTRACTION W/ INTRAOCULAR LENS  IMPLANT, BILATERAL  ~ 2016  . EYE SURGERY Bilateral    catarct  . I&D EXTREMITY Left 10/17/2014   Procedure: IRRIGATION AND DEBRIDEMENT EXTREMITY LEFT FOOT;  Surgeon: Nadara Mustard, MD;  Location: [facility] OR;  Service: Orthopedics;  Laterality: Left;  . STUMP REVISION Left 08/16/2017   Procedure: REVISION LEFT TRANSMETATARSAL AMPUTATION;  Surgeon: Nadara Mustard, MD;  Location: [facility] OR;  Service: Orthopedics;  Laterality: Left;  . STUMP REVISION Left 12/27/2017   Procedure: LEFT BELOW KNEE AMPUTATION REVISION;  Surgeon: Nadara Mustard, MD;  Location: [facility] OR;  Service: Orthopedics;  Laterality: Left;  . TOE AMPUTATION Right 2013   5th toe     reports that he has never smoked. He has never used smokeless tobacco. He reports that he drinks alcohol. He reports that he does not use drugs.  Allergies  Allergen Reactions  . Oxycodone Shortness Of Breath    Family History  Problem Relation Age of Onset  . Dementia Mother  Prior to Admission medications   Medication Sig Start Date End Date Taking? Authorizing Provider  aspirin EC 325 MG EC tablet Take 1 tablet (325 mg total) by mouth daily. Patient taking differently: Take 81 mg by mouth daily.  11/30/17   Angiulli, Mcarthur Rossetti, PA-C  insulin aspart (NOVOLOG FLEXPEN) 100 UNIT/ML FlexPen Inject 4 Units into the skin 3 (three) times daily with meals. Patient taking differently: Inject 8 Units into the skin 3 (three) times daily with meals.  11/30/17   Angiulli, Mcarthur Rossetti, PA-C  Insulin Detemir (LEVEMIR FLEXTOUCH) 100 UNIT/ML Pen Inject 25 Units into the skin daily at  10 pm. Patient taking differently: Inject 40 Units into the skin daily at 10 pm.  11/30/17   Angiulli, Mcarthur Rossetti, PA-C  metFORMIN (GLUCOPHAGE) 500 MG tablet Take 1 tablet (500 mg total) by mouth 2 (two) times daily with a meal. 11/30/17   Charlton Amor, PA-C    Physical Exam: Vitals:   05/06/18 2045 05/06/18 2106 05/06/18 2145 05/06/18 2200  BP: (!) 138/93 (!) 148/80 (!) 145/91 (!) 151/84  Pulse: 83 (!) 48 74 (!) 54  Resp: 17  16 11   Temp: 97.7 F (36.5 C)     TempSrc: Oral     SpO2: 100%  99% 100%  Weight:      Height:        Constitutional: NAD, calm, comfortable Eyes: PERRL, lids and conjunctivae normal ENMT: Mucous membranes are moist. Posterior pharynx clear of any exudate or lesions.Normal dentition.  Neck: normal, supple, no masses, no thyromegaly Respiratory: clear to auscultation bilaterally, no wheezing, no crackles. Normal respiratory effort. No accessory muscle use.  Cardiovascular: Regular rate and rhythm, no murmurs / rubs / gallops. No extremity edema. 2+ pedal pulses. No carotid bruits.  Abdomen: no tenderness, no masses palpated. No hepatosplenomegaly. Bowel sounds positive.  Musculoskeletal: no clubbing / cyanosis. No joint deformity upper and lower extremities. Good ROM, no contractures. Normal muscle tone.  Skin: no rashes, lesions, ulcers. No induration Neurologic: CN 2-12 grossly intact. Sensation intact, DTR normal. Strength 5/5 in all 4.  Psychiatric: Normal judgment and insight. Alert and oriented x 3. Normal mood.    Labs on Admission: I have personally reviewed following labs and imaging studies  CBC: Recent Labs  Lab 05/06/18 1720  WBC 8.0  NEUTROABS 6.2  HGB 12.1*  HCT 37.6*  MCV 88.7  PLT 167   Basic Metabolic Panel: Recent Labs  Lab 05/06/18 1720  NA 137  K 4.2  CL 109  CO2 22  GLUCOSE 211*  BUN 24*  CREATININE 1.31*  CALCIUM 9.0   GFR: Estimated Creatinine Clearance: 64.3 mL/min (A) (by C-G formula based on SCr of 1.31 mg/dL  (H)). Liver Function Tests: Recent Labs  Lab 05/06/18 1720  AST 19  ALT 15  ALKPHOS 80  BILITOT 0.9  PROT 6.3*  ALBUMIN 3.9   No results for input(s): LIPASE, AMYLASE in the last 168 hours. No results for input(s): AMMONIA in the last 168 hours. Coagulation Profile: No results for input(s): INR, PROTIME in the last 168 hours. Cardiac Enzymes: No results for input(s): CKTOTAL, CKMB, CKMBINDEX, TROPONINI in the last 168 hours. BNP (last 3 results) No results for input(s): PROBNP in the last 8760 hours. HbA1C: No results for input(s): HGBA1C in the last 72 hours. CBG: No results for input(s): GLUCAP in the last 168 hours. Lipid Profile: No results for input(s): CHOL, HDL, LDLCALC, TRIG, CHOLHDL, LDLDIRECT in the last 72 hours. Thyroid Function  Tests: No results for input(s): TSH, T4TOTAL, FREET4, T3FREE, THYROIDAB in the last 72 hours. Anemia Panel: No results for input(s): VITAMINB12, FOLATE, FERRITIN, TIBC, IRON, RETICCTPCT in the last 72 hours. Urine analysis: No results found for: COLORURINE, APPEARANCEUR, LABSPEC, PHURINE, GLUCOSEU, HGBUR, BILIRUBINUR, KETONESUR, PROTEINUR, UROBILINOGEN, NITRITE, LEUKOCYTESUR  Radiological Exams on Admission: Dg Chest Port 1 View  Result Date: 05/06/2018 CLINICAL DATA:  Weakness for couple of hours.  Near syncope EXAM: PORTABLE CHEST 1 VIEW COMPARISON:  07/19/2017 FINDINGS: Prominent heart size accentuated by low volumes. Negative mediastinal contours for technique. There is no edema, consolidation, effusion, or pneumothorax. Artifact from EKG leads. IMPRESSION: Low volume chest without acute finding. Electronically Signed   By: Marnee Spring M.D.   On: 05/06/2018 18:15    EKG: Independently reviewed.  Assessment/Plan Principal Problem:   Symptomatic bradycardia Active Problems:   CKD (chronic kidney disease) stage 3, GFR 30-59 ml/min (HCC)   Type 2 diabetes mellitus with peripheral neuropathy (HCC)   Postural dizziness with near  syncope    1. Postural orthostatic bradycardia - 1. Not clear why we are seeing this paradoxical drop in HR from 80s to 40s with standing up. 2. Not on any meds that could cause bradycardia 3. Though I suspect this is causing his near syncope symptoms 4. Syncope pathway 5. Tele monitor 6. 2d echo 7. Serial trops 8. Cardiology consulted and coming to see him tonight - Cardiologist fellow also didn't have an immediate answer as to whats going on here. 9. Thankfully stable and asymptomatic while laying down.  BPs remain 151/84. 2. CKD stage 3 - chronic and stable 3. DM2 - 1. Levemir 30 QHS (takes 40 at home) 2. 4u novolog mealtime 3. Mod scale SSI AC  DVT prophylaxis: Lovenox Code Status: Full Family Communication: Wife at bedside Disposition Plan: Home after admit Consults called: Cardiology coming to evaluate patient Admission status: Place in [place] - for now     Hillary Bow. DO [facility] Pager [phone] Only works nights!  If 7AM-7PM, please contact the primary day team physician taking care of patient  [email or web address] Password [email or web address]  05/06/2018, 10:16 PM

## 2018-05-06 NOTE — ED Provider Notes (Signed)
[facility] EMERGENCY DEPARTMENT Provider Note   CSN: [id number] Arrival date & time: 05/06/18  1702     History   Chief Complaint Chief Complaint  Patient presents with  . Weakness    HPI Kevin Bradford is a 72 y.o. male.  Patient is a 72 year old male with a history of chronic kidney disease stage III, diabetes and complications of diabetes status post AKA on the left presenting today with sudden onset of feeling lightheaded, nauseated, diaphoretic.  Patient states that over the last 3 to 4 days he had noticed he been a little more tired than usual but today went to his son's house and was doing some plumbing and was up on a ladder and seemed to be fine.  Approximately 30 minutes to an hour after that he suddenly developed this overwhelming feeling of nausea, lightheadedness and sweating.  He stated he immediately needed to lay down which made him feel better.  Paramedics noted him to be extremely orthostatic and every time they sat him or stood him he became much more symptomatic.  Patient says he has some slight tingling in his right fingers but denies any chest pain, abdominal pain, diarrhea, vomiting.  He has had no black stools or signs of bleeding.  He is not on anticoagulation.  Last surgery was in June for his amputation and things have been going well.  He does not know why he would be dehydrated but wife did not think he drank very much today and did eat [place] food last night.  He has no known cardiac disease.  The history is provided by the patient.  Weakness  Primary symptoms include dizziness. Primary symptoms comment: nausea, lightheaded, near synope and diaphoresis. This is a new problem. Episode onset: started at 3:30 today. The problem has been gradually improving. There was no focality noted. There has been no fever. Associated symptoms include shortness of breath. Pertinent negatives include no chest pain, no vomiting, no altered mental status, no  confusion and no headaches. There were no medications administered prior to arrival. Associated medical issues do not include trauma, a bleeding disorder, seizures or dementia.    Past Medical History:  Diagnosis Date  . Arthritis    "joint pain" (08/16/2017)  . Cellulitis and abscess of leg 10/18/2014  . CKD (chronic kidney disease) stage 3, GFR 30-59 ml/min (HCC)    creatinine increases with antibiotics per pt, no known kidney disease per patient  . Dehiscence of closure of skin, subsequent encounter   . Diabetic foot ulcer (HCC)    left  . Diabetic peripheral neuropathy (HCC)   . GERD (gastroesophageal reflux disease)    12/26/2017- "not this year."   . Osteomyelitis (HCC)    left transmetatarsal   . Type II diabetes mellitus [facility])     Patient Active Problem List   Diagnosis Date Noted  . Labile blood glucose   . Acute blood loss anemia   . Labile blood pressure   . Stage 3 chronic kidney disease (HCC)   . Type 2 diabetes mellitus with peripheral neuropathy (HCC)   . Post-operative pain   . Amputation of left lower extremity below knee upon examination (HCC) 11/24/2017  . Amputated below knee, left (HCC) 11/22/2017  . Osteomyelitis of left foot (HCC)   . Dehiscence of amputation stump (HCC) 08/11/2017  . S/P transmetatarsal amputation of foot, left (HCC) 07/19/2017  . Subacute osteomyelitis, left ankle and foot (HCC) 07/17/2017  . Diabetic ulcer of left foot  associated with type 2 diabetes mellitus (HCC)   . Diabetes mellitus with renal complications (HCC) 10/17/2014  . Diabetes (HCC) 10/13/2014  . Anemia 10/13/2014  . CKD (chronic kidney disease) stage 3, GFR 30-59 ml/min (HCC) 10/13/2014  . Hyperkalemia 10/13/2014    Past Surgical History:  Procedure Laterality Date  . AMPUTATION Left 07/19/2017   Procedure: LEFT TRANSMETATARSAL AMPUTATION;  Surgeon: Nadara Mustard, MD;  Location: [facility] OR;  Service: Orthopedics;  Laterality: Left;  . AMPUTATION Left 08/16/2017   REVISION  LEFT TRANSMETATARSAL AMPUTATION  . AMPUTATION Left 11/22/2017   Procedure: LEFT BELOW KNEE AMPUTATION;  Surgeon: Nadara Mustard, MD;  Location: [facility] OR;  Service: Orthopedics;  Laterality: Left;  . CATARACT EXTRACTION W/ INTRAOCULAR LENS  IMPLANT, BILATERAL  ~ 2016  . EYE SURGERY Bilateral    catarct  . I&D EXTREMITY Left 10/17/2014   Procedure: IRRIGATION AND DEBRIDEMENT EXTREMITY LEFT FOOT;  Surgeon: Nadara Mustard, MD;  Location: [facility] OR;  Service: Orthopedics;  Laterality: Left;  . STUMP REVISION Left 08/16/2017   Procedure: REVISION LEFT TRANSMETATARSAL AMPUTATION;  Surgeon: Nadara Mustard, MD;  Location: [facility] OR;  Service: Orthopedics;  Laterality: Left;  . STUMP REVISION Left 12/27/2017   Procedure: LEFT BELOW KNEE AMPUTATION REVISION;  Surgeon: Nadara Mustard, MD;  Location: [facility] OR;  Service: Orthopedics;  Laterality: Left;  . TOE AMPUTATION Right 2013   5th toe        Home Medications    Prior to Admission medications   Medication Sig Start Date End Date Taking? Authorizing Provider  aspirin EC 325 MG EC tablet Take 1 tablet (325 mg total) by mouth daily. Patient taking differently: Take 81 mg by mouth daily.  11/30/17   Angiulli, Mcarthur Rossetti, PA-C  insulin aspart (NOVOLOG FLEXPEN) 100 UNIT/ML FlexPen Inject 4 Units into the skin 3 (three) times daily with meals. Patient taking differently: Inject 8 Units into the skin 3 (three) times daily with meals.  11/30/17   Angiulli, Mcarthur Rossetti, PA-C  Insulin Detemir (LEVEMIR FLEXTOUCH) 100 UNIT/ML Pen Inject 25 Units into the skin daily at 10 pm. Patient taking differently: Inject 40 Units into the skin daily at 10 pm.  11/30/17   Angiulli, Mcarthur Rossetti, PA-C  metFORMIN (GLUCOPHAGE) 500 MG tablet Take 1 tablet (500 mg total) by mouth 2 (two) times daily with a meal. 11/30/17   Angiulli, Mcarthur Rossetti, PA-C    Family History Family History  Problem Relation Age of Onset  . Dementia Mother     Social History Social History   Tobacco Use  . Smoking status:  Never Smoker  . Smokeless tobacco: Never Used  Substance Use Topics  . Alcohol use: Yes    Comment:  "maybe 1 beer 2 times a month"-12/26/2017  . Drug use: No     Allergies   Oxycodone   Review of Systems Review of Systems  Respiratory: Positive for shortness of breath.   Cardiovascular: Negative for chest pain.  Gastrointestinal: Negative for vomiting.  Neurological: Positive for dizziness and weakness. Negative for headaches.  Psychiatric/Behavioral: Negative for confusion.  All other systems reviewed and are negative.    Physical Exam Updated Vital Signs BP (!) 144/78   Pulse (!) 49   Temp (!) 96.1 F (35.6 C) (Axillary)   Resp 16   Ht 6\' 2"  (1.88 m)   Wt 99.8 kg   SpO2 (!) 14%   BMI 28.25 kg/m   Physical Exam  Constitutional: He is oriented to person,  place, and time. He appears well-developed and well-nourished. No distress.  HENT:  Head: Normocephalic and atraumatic.  Mouth/Throat: Oropharynx is clear and moist.  Eyes: Pupils are equal, round, and reactive to light. Conjunctivae and EOM are normal.  Neck: Normal range of motion. Neck supple.  Cardiovascular: Regular rhythm and intact distal pulses. Bradycardia present.  No murmur heard. Pulmonary/Chest: Effort normal and breath sounds normal. No respiratory distress. He has no wheezes. He has no rales.  Abdominal: Soft. He exhibits no distension. There is no tenderness. There is no rebound and no guarding.  Musculoskeletal: Normal range of motion. He exhibits no edema or tenderness.  Neurological: He is alert and oriented to person, place, and time. He has normal strength. No cranial nerve deficit or sensory deficit.  No pronator drift noted in the upper extremities.  5 out of 5 strength in all 4 extremities.  Pulses present in the right lower extremity.  Speech without aphasia or slurring area no nystagmus noted or cranial nerve palsies  Skin: Skin is warm. No rash noted. He is diaphoretic. No erythema. There  is pallor.  Psychiatric: He has a normal mood and affect. His behavior is normal.  Nursing note and vitals reviewed.    ED Treatments / Results  Labs (all labs ordered are listed, but only abnormal results are displayed) Labs Reviewed  CBC WITH DIFFERENTIAL/PLATELET - Abnormal; Notable for the following components:      Result Value   Hemoglobin 12.1 (*)    HCT 37.6 (*)    All other components within normal limits  COMPREHENSIVE METABOLIC PANEL - Abnormal; Notable for the following components:   Glucose, Bld 211 (*)    BUN 24 (*)    Creatinine, Ser 1.31 (*)    Total Protein 6.3 (*)    GFR calc non Af Amer 53 (*)    All other components within normal limits  ETHANOL  TROPONIN I  TROPONIN I  TROPONIN I  I-STAT TROPONIN, ED  I-STAT TROPONIN, ED    EKG EKG Interpretation  Date/Time:  Sunday May 06 2018 17:06:23 EDT Ventricular Rate:  50 PR Interval:    QRS Duration: 102 QT Interval:  516 QTC Calculation: 471 R Axis:   -5 Text Interpretation:  Sinus rhythm Abnormal R-wave progression, early transition Confirmed by Gwyneth Sprout ([id number]) on 05/06/2018 5:50:20 PM   Radiology Dg Chest Port 1 View  Result Date: 05/06/2018 CLINICAL DATA:  Weakness for couple of hours.  Near syncope EXAM: PORTABLE CHEST 1 VIEW COMPARISON:  07/19/2017 FINDINGS: Prominent heart size accentuated by low volumes. Negative mediastinal contours for technique. There is no edema, consolidation, effusion, or pneumothorax. Artifact from EKG leads. IMPRESSION: Low volume chest without acute finding. Electronically Signed   By: Marnee Spring M.D.   On: 05/06/2018 18:15    Procedures Procedures (including critical care time)  Medications Ordered in ED Medications  ondansetron (ZOFRAN) injection 4 mg (has no administration in time range)  sodium chloride 0.9 % bolus 1,000 mL (has no administration in time range)     Initial Impression / Assessment and Plan / ED Course  I have reviewed the  triage vital signs and the nursing notes.  Pertinent labs & imaging results that were available during my care of the patient were reviewed by me and considered in my medical decision making (see chart for details).     Patient presenting today with sudden onset of lightheadedness, near syncope, nausea.  Blood sugar was checked and patient was in  the 200s.  He currently feels okay with lying down but become symptomatic with sitting up.  He denies symptoms more suggestive of vertigo and has no focal neurologic deficits.  Lower suspicion for vertigo or stroke.  Patient was noted to be severely orthostatic and symptoms worsened when he stood up.  Concern for possible cardiac etiology, dehydration and kidney injury, electrolyte abnormality.  Lower suspicion for AAA or dissection as patient has no chest or abdominal discomfort.  Also concern for possible anemia  7:52 PM Labs, cxr and EKG without acute findings.  Pt feeling much better after fluids.  Concern for othostatic hypotension today which has resolved with fluids.  No sign of anemia, acute kidney injury or other issues at this time.  Findings discussed with the patient and his wife.  He is requesting something to drink and eat.  9:06 PM Attempt to get patient up to walk and he began feeling nauseated and lightheaded again.  Not as bad as he did but it is still present.  He became bradycardic again in the 40s as his heart rate had improved to the 80s after fluid.  Will admit for obs. Spoke with cardiology who will come see the pt.  Final Clinical Impressions(s) / ED Diagnoses   Final diagnoses:  Near syncope  Bradycardia, unspecified  Orthostatic hypotension    ED Discharge Orders    None       Gwyneth Sprout, MD 05/06/18 2207

## 2018-05-07 ENCOUNTER — Observation Stay (HOSPITAL_COMMUNITY): Payer: Medicare Other

## 2018-05-07 ENCOUNTER — Observation Stay (HOSPITAL_BASED_OUTPATIENT_CLINIC_OR_DEPARTMENT_OTHER): Payer: Medicare Other

## 2018-05-07 ENCOUNTER — Encounter (HOSPITAL_COMMUNITY): Payer: Self-pay | Admitting: Internal Medicine

## 2018-05-07 ENCOUNTER — Ambulatory Visit: Payer: Medicare Other | Admitting: Physical Therapy

## 2018-05-07 DIAGNOSIS — R55 Syncope and collapse: Secondary | ICD-10-CM | POA: Diagnosis not present

## 2018-05-07 DIAGNOSIS — R001 Bradycardia, unspecified: Secondary | ICD-10-CM

## 2018-05-07 DIAGNOSIS — I6509 Occlusion and stenosis of unspecified vertebral artery: Secondary | ICD-10-CM | POA: Diagnosis present

## 2018-05-07 DIAGNOSIS — E1122 Type 2 diabetes mellitus with diabetic chronic kidney disease: Secondary | ICD-10-CM | POA: Diagnosis present

## 2018-05-07 DIAGNOSIS — D631 Anemia in chronic kidney disease: Secondary | ICD-10-CM | POA: Diagnosis present

## 2018-05-07 DIAGNOSIS — Z79899 Other long term (current) drug therapy: Secondary | ICD-10-CM | POA: Diagnosis not present

## 2018-05-07 DIAGNOSIS — I6503 Occlusion and stenosis of bilateral vertebral arteries: Secondary | ICD-10-CM | POA: Diagnosis not present

## 2018-05-07 DIAGNOSIS — E785 Hyperlipidemia, unspecified: Secondary | ICD-10-CM | POA: Diagnosis present

## 2018-05-07 DIAGNOSIS — I63011 Cerebral infarction due to thrombosis of right vertebral artery: Secondary | ICD-10-CM | POA: Diagnosis not present

## 2018-05-07 DIAGNOSIS — Z7982 Long term (current) use of aspirin: Secondary | ICD-10-CM | POA: Diagnosis not present

## 2018-05-07 DIAGNOSIS — K219 Gastro-esophageal reflux disease without esophagitis: Secondary | ICD-10-CM | POA: Diagnosis present

## 2018-05-07 DIAGNOSIS — E1142 Type 2 diabetes mellitus with diabetic polyneuropathy: Secondary | ICD-10-CM | POA: Diagnosis present

## 2018-05-07 DIAGNOSIS — R42 Dizziness and giddiness: Secondary | ICD-10-CM | POA: Diagnosis not present

## 2018-05-07 DIAGNOSIS — Z6827 Body mass index (BMI) 27.0-27.9, adult: Secondary | ICD-10-CM | POA: Diagnosis not present

## 2018-05-07 DIAGNOSIS — I5030 Unspecified diastolic (congestive) heart failure: Secondary | ICD-10-CM | POA: Diagnosis not present

## 2018-05-07 DIAGNOSIS — I63211 Cerebral infarction due to unspecified occlusion or stenosis of right vertebral arteries: Secondary | ICD-10-CM | POA: Diagnosis not present

## 2018-05-07 DIAGNOSIS — Z23 Encounter for immunization: Secondary | ICD-10-CM | POA: Diagnosis not present

## 2018-05-07 DIAGNOSIS — I6300 Cerebral infarction due to thrombosis of unspecified precerebral artery: Secondary | ICD-10-CM | POA: Diagnosis not present

## 2018-05-07 DIAGNOSIS — Z89512 Acquired absence of left leg below knee: Secondary | ICD-10-CM | POA: Diagnosis not present

## 2018-05-07 DIAGNOSIS — E119 Type 2 diabetes mellitus without complications: Secondary | ICD-10-CM | POA: Diagnosis not present

## 2018-05-07 DIAGNOSIS — I129 Hypertensive chronic kidney disease with stage 1 through stage 4 chronic kidney disease, or unspecified chronic kidney disease: Secondary | ICD-10-CM | POA: Diagnosis present

## 2018-05-07 DIAGNOSIS — N183 Chronic kidney disease, stage 3 (moderate): Secondary | ICD-10-CM | POA: Diagnosis not present

## 2018-05-07 DIAGNOSIS — I639 Cerebral infarction, unspecified: Secondary | ICD-10-CM | POA: Diagnosis not present

## 2018-05-07 DIAGNOSIS — Z794 Long term (current) use of insulin: Secondary | ICD-10-CM | POA: Diagnosis not present

## 2018-05-07 DIAGNOSIS — R29700 NIHSS score 0: Secondary | ICD-10-CM | POA: Diagnosis present

## 2018-05-07 DIAGNOSIS — E663 Overweight: Secondary | ICD-10-CM | POA: Diagnosis present

## 2018-05-07 DIAGNOSIS — R29810 Facial weakness: Secondary | ICD-10-CM | POA: Diagnosis present

## 2018-05-07 DIAGNOSIS — I951 Orthostatic hypotension: Secondary | ICD-10-CM | POA: Diagnosis not present

## 2018-05-07 DIAGNOSIS — I495 Sick sinus syndrome: Secondary | ICD-10-CM | POA: Diagnosis not present

## 2018-05-07 DIAGNOSIS — S88112A Complete traumatic amputation at level between knee and ankle, left lower leg, initial encounter: Secondary | ICD-10-CM | POA: Diagnosis not present

## 2018-05-07 HISTORY — PX: IR ANGIO INTRA EXTRACRAN SEL COM CAROTID INNOMINATE BILAT MOD SED: IMG5360

## 2018-05-07 HISTORY — PX: IR ANGIO VERTEBRAL SEL SUBCLAVIAN INNOMINATE BILAT MOD SED: IMG5366

## 2018-05-07 LAB — TROPONIN I
Troponin I: <0.03 ng/mL (ref <0.03)
Troponin I: 0.17 ng/mL (ref <0.03)
Troponin I: 0.44 ng/mL (ref <0.03)

## 2018-05-07 LAB — GLUCOSE, CAPILLARY
GLUCOSE-CAPILLARY: 131 mg/dL — AB (ref 70–99)
Glucose-Capillary: 198 mg/dL — ABNORMAL HIGH (ref 70–99)
Glucose-Capillary: 136 mg/dL — ABNORMAL HIGH (ref 70–99)
Glucose-Capillary: 149 mg/dL — ABNORMAL HIGH (ref 70–99)

## 2018-05-07 LAB — ECHOCARDIOGRAM COMPLETE
HEIGHTINCHES: 74 in
Weight: 3534.41 oz

## 2018-05-07 LAB — PROTIME-INR
INR: 1.09
Prothrombin Time: 14 seconds (ref 11.4–15.2)

## 2018-05-07 LAB — MRSA PCR SCREENING: MRSA by PCR: NEGATIVE

## 2018-05-07 MED ORDER — IOPAMIDOL (ISOVUE-300) INJECTION 61%
INTRAVENOUS | Status: AC
  Filled 2018-05-07: qty 50

## 2018-05-07 MED ORDER — HEPARIN SODIUM (PORCINE) 1000 UNIT/ML IJ SOLN
INTRAMUSCULAR | Status: AC
  Filled 2018-05-07: qty 1

## 2018-05-07 MED ORDER — FENTANYL CITRATE (PF) 100 MCG/2ML IJ SOLN
INTRAMUSCULAR | Status: AC | PRN
  Administered 2018-05-07: 25 ug via INTRAVENOUS

## 2018-05-07 MED ORDER — MIDAZOLAM HCL 2 MG/2ML IJ SOLN
INTRAMUSCULAR | Status: AC | PRN
  Administered 2018-05-07: 1 mg via INTRAVENOUS

## 2018-05-07 MED ORDER — CLOPIDOGREL BISULFATE 75 MG PO TABS
75.0000 mg | ORAL_TABLET | Freq: Every day | ORAL | Status: DC
  Administered 2018-05-08 (×2): 75 mg via ORAL
  Filled 2018-05-07 (×2): qty 1

## 2018-05-07 MED ORDER — IOHEXOL 300 MG/ML  SOLN
150.0000 mL | Freq: Once | INTRAMUSCULAR | Status: AC | PRN
  Administered 2018-05-07: 75 mL via INTRA_ARTERIAL

## 2018-05-07 MED ORDER — LIDOCAINE HCL 1 % IJ SOLN
INTRAMUSCULAR | Status: AC | PRN
  Administered 2018-05-07: 20 mL

## 2018-05-07 MED ORDER — HEPARIN SODIUM (PORCINE) 1000 UNIT/ML IJ SOLN
INTRAMUSCULAR | Status: AC | PRN
  Administered 2018-05-07: 1000 [IU] via INTRAVENOUS

## 2018-05-07 MED ORDER — ALUM & MAG HYDROXIDE-SIMETH 200-200-20 MG/5ML PO SUSP
30.0000 mL | Freq: Four times a day (QID) | ORAL | Status: DC | PRN
  Administered 2018-05-07: 30 mL via ORAL
  Filled 2018-05-07: qty 30

## 2018-05-07 MED ORDER — MIDAZOLAM HCL 2 MG/2ML IJ SOLN
INTRAMUSCULAR | Status: AC
  Filled 2018-05-07: qty 2

## 2018-05-07 MED ORDER — FENTANYL CITRATE (PF) 100 MCG/2ML IJ SOLN
INTRAMUSCULAR | Status: AC
  Filled 2018-05-07: qty 2

## 2018-05-07 MED ORDER — LIDOCAINE HCL 1 % IJ SOLN
INTRAMUSCULAR | Status: AC
  Filled 2018-05-07: qty 20

## 2018-05-07 MED ORDER — SODIUM CHLORIDE 0.9 % IV SOLN
INTRAVENOUS | Status: AC
  Administered 2018-05-07: 18:00:00 via INTRAVENOUS

## 2018-05-07 MED ORDER — LORAZEPAM 2 MG/ML IJ SOLN
0.5000 mg | Freq: Once | INTRAMUSCULAR | Status: AC
  Administered 2018-05-07: 0.5 mg via INTRAVENOUS
  Filled 2018-05-07: qty 1

## 2018-05-07 NOTE — Progress Notes (Signed)
  Echocardiogram 2D Echocardiogram has been performed.  Celene Skeen 05/07/2018, 9:34 AM

## 2018-05-07 NOTE — Progress Notes (Signed)
RN attempted to give report. RN nor CN available

## 2018-05-07 NOTE — Progress Notes (Signed)
Pt insisted he use a wheelchair and wheel to bathroom to sit on toilet. I informed the pt if his heart rate starts to drop while standing, then he will have to sit back down. Pt agreed. Pt heart rate stayed between 80-100 during time of transition to and from wheelchair and toilet. MD Julian Reil informed

## 2018-05-07 NOTE — Consult Note (Signed)
Cardiology Consultation:   Patient ID: Kevin Bradford; [id number]; 20-Jan-1946   Admit date: 05/06/2018 Date of Consult: 05/07/2018  Primary Care Provider: Georgann Housekeeper, MD Primary Cardiologist: No primary care provider on file. Primary Electrophysiologist:  None  Chief Complaint: Dizziness, lightheadedness, vertigo, nausea  Patient Profile:   Kevin Bradford is a 72 y.o. male with a history of diabetes mellitus complicated by left below the knee amputation and peripheral neuropathy, chronic kidney disease, who is being seen today for the evaluation of pre-syncope, nausea, vertigo and bradycardia at the request of the general medicine team.  History of Present Illness:   The patient reports that he was in his usual state of health today. At approximately 3:15 pm the patient had just finished some outdoors work replacing an outside faucet, he went upstairs to the kitchen area where there were 4-5 other people around the table, his wife's son had fixed some food. He sat down and ate several fried snacks with sour cream and salsa and spoke with his grandson. He then stood up and subsequently picked up his plate, walked it over to the kitchen sink, rinsed it off, and moved away from the sink. He immediately felt "wobbly" (vertigo and pre-syncope) on both legs including his good leg. He held onto the counter top and requested a chair because he felt like he was about to pass out. He sat down on the chair with assistance and began to sweat profusely and feel nauseated with tingling in the right hand. Over the course of the following 20 minutes he was transferred to an office chair with wheels and layed him down. He arrived to the emergency department with ongoing nausea. He would become extremely dizzy every time he stood although orthostatic hypotension not documented.The tinging in his right hand had subsided. He continued to feel light headed (vertigo). His ongoing symptoms at this time include  indigestion.    Past Medical History:  Diagnosis Date  . Arthritis    "joint pain" (08/16/2017)  . Cellulitis and abscess of leg 10/18/2014  . CKD (chronic kidney disease) stage 3, GFR 30-59 ml/min (HCC)    creatinine increases with antibiotics per pt, no known kidney disease per patient  . Dehiscence of closure of skin, subsequent encounter   . Diabetic foot ulcer (HCC)    left  . Diabetic peripheral neuropathy (HCC)   . GERD (gastroesophageal reflux disease)    12/26/2017- "not this year."   . Osteomyelitis (HCC)    left transmetatarsal   . Type II diabetes mellitus (HCC)     Past Surgical History:  Procedure Laterality Date  . AMPUTATION Left 07/19/2017   Procedure: LEFT TRANSMETATARSAL AMPUTATION;  Surgeon: Nadara Mustard, MD;  Location: [facility] OR;  Service: Orthopedics;  Laterality: Left;  . AMPUTATION Left 08/16/2017   REVISION LEFT TRANSMETATARSAL AMPUTATION  . AMPUTATION Left 11/22/2017   Procedure: LEFT BELOW KNEE AMPUTATION;  Surgeon: Nadara Mustard, MD;  Location: [facility] [place] OR;  Service: Orthopedics;  Laterality: Left;  . CATARACT EXTRACTION W/ INTRAOCULAR LENS  IMPLANT, BILATERAL  ~ 2016  . EYE SURGERY Bilateral    catarct  . I&D EXTREMITY Left 10/17/2014   Procedure: IRRIGATION AND DEBRIDEMENT EXTREMITY LEFT FOOT;  Surgeon: Nadara Mustard, MD;  Location: [facility] OR;  Service: Orthopedics;  Laterality: Left;  . STUMP REVISION Left 08/16/2017   Procedure: REVISION LEFT TRANSMETATARSAL AMPUTATION;  Surgeon: Nadara Mustard, MD;  Location: [facility] OR;  Service: Orthopedics;  Laterality: Left;  . STUMP REVISION Left  12/27/2017   Procedure: LEFT BELOW KNEE AMPUTATION REVISION;  Surgeon: Nadara Mustard, MD;  Location: [facility] OR;  Service: Orthopedics;  Laterality: Left;  . TOE AMPUTATION Right 2013   5th toe     Inpatient Medications: Scheduled Meds: . aspirin  81 mg Oral Daily  . enoxaparin (LOVENOX) injection  40 mg Subcutaneous Q24H  . insulin aspart  0-15 Units Subcutaneous TID WC  . insulin aspart   4 Units Subcutaneous TID WC  . insulin detemir  30 Units Subcutaneous QHS  . sodium chloride flush  3 mL Intravenous Q12H   Continuous Infusions:  PRN Meds: alum & mag hydroxide-simeth  Home Meds: Prior to Admission medications   Medication Sig Start Date End Date Taking? Authorizing Provider  aspirin EC 81 MG tablet Take 162 mg by mouth 2 (two) times daily.   Yes [provider]  insulin aspart (NOVOLOG FLEXPEN) 100 UNIT/ML FlexPen Inject 4 Units into the skin 3 (three) times daily with meals. Patient taking differently: Inject 8 Units into the skin 3 (three) times daily with meals.  11/30/17  Yes Angiulli, Mcarthur Rossetti, PA-C  Insulin Detemir (LEVEMIR FLEXTOUCH) 100 UNIT/ML Pen Inject 25 Units into the skin daily at 10 pm. Patient taking differently: Inject 40 Units into the skin daily at 10 pm.  11/30/17  Yes Angiulli, Mcarthur Rossetti, PA-C  metFORMIN (GLUCOPHAGE) 500 MG tablet Take 1 tablet (500 mg total) by mouth 2 (two) times daily with a meal. 11/30/17  Yes Angiulli, Mcarthur Rossetti, PA-C    Allergies:    Allergies  Allergen Reactions  . Oxycodone Shortness Of Breath    Social History:   Social History   Socioeconomic History  . Marital status: Married    Spouse name: Not on file  . Number of children: Not on file  . Years of education: Not on file  . Highest education level: Not on file  Occupational History  . Not on file  Social Needs  . Financial resource strain: Not hard at all  . Food insecurity:    Worry: Never true    Inability: Never true  . Transportation needs:    Medical: No    Non-medical: No  Tobacco Use  . Smoking status: Never Smoker  . Smokeless tobacco: Never Used  Substance and Sexual Activity  . Alcohol use: Yes    Comment:  "maybe 1 beer 2 times a month"-12/26/2017  . Drug use: No  . Sexual activity: Yes  Lifestyle  . Physical activity:    Days per week: 5 days    Minutes per session: 120 min  . Stress: Not at all  Relationships  . Social  connections:    Talks on phone: Three times a week    Gets together: Three times a week    Attends religious service: Never    Active member of club or organization: No    Attends meetings of clubs or organizations: Never    Relationship status: Married  . Intimate partner violence:    Fear of current or ex partner: No    Emotionally abused: No    Physically abused: No    Forced sexual activity: No  Other Topics Concern  . Not on file  Social History Narrative  . Not on file    Family History:   The patient's family history includes Dementia in his mother.  ROS:  Review of Systems  Constitutional: Positive for diaphoresis and malaise/fatigue. Negative for chills and fever.  HENT: Negative  for congestion, hearing loss, sinus pain and tinnitus.   Eyes: Negative for blurred vision, double vision and photophobia.  Respiratory: Positive for shortness of breath. Negative for cough, hemoptysis and sputum production.   Cardiovascular: Negative for chest pain, palpitations, orthopnea, claudication, leg swelling and PND.  Gastrointestinal: Positive for heartburn, nausea and vomiting. Negative for abdominal pain, constipation and diarrhea.  Genitourinary: Negative for dysuria, frequency and urgency.  Musculoskeletal: Negative for back pain, myalgias and neck pain.  Neurological: Positive for dizziness, tingling and sensory change. Negative for tremors, speech change, focal weakness, seizures, loss of consciousness, weakness and headaches.  Endo/Heme/Allergies: Does not bruise/bleed easily.  Psychiatric/Behavioral: Negative for depression, substance abuse and suicidal ideas.   .    Physical Exam/Data:   Vitals:   05/06/18 2106 05/06/18 2145 05/06/18 2200 05/06/18 2252  BP: (!) 148/80 (!) 145/91 (!) 151/84 (!) 155/96  Pulse: (!) 48 74 (!) 54 (!) 59  Resp:  16 11 16   Temp:    (!) 97.5 F (36.4 C)  TempSrc:    Oral  SpO2:  99% 100% 99%  Weight:    100.2 kg  Height:    6\' 2"  (1.88 m)      Intake/Output Summary (Last 24 hours) at 05/07/2018 0144 Last data filed at 05/06/2018 2217 Gross per 24 hour  Intake 2000.02 ml  Output -  Net 2000.02 ml   Filed Weights   05/06/18 1711 05/06/18 2252  Weight: 99.8 kg 100.2 kg   Body mass index is 28.36 kg/m.  General: Well developed, well nourished, in no acute distress. Head: Normocephalic, atraumatic, sclera non-icteric, no xanthomas, nares are without discharge.  Neck: Negative for carotid bruits. JVD not elevated. Lungs: Clear bilaterally to auscultation without wheezes, rales, or rhonchi. Breathing is unlabored. Heart: RRR with S1 S2. No murmurs, rubs, or gallops appreciated. Abdomen: Soft, non-tender, non-distended with normoactive bowel sounds. No hepatomegaly. No rebound/guarding. No obvious abdominal masses. Msk:  Strength and tone appear normal for age. Extremities: No clubbing or cyanosis. Left BKA Distal pedal pulses are 2+ and equal bilaterally. Neuro: Alert and oriented X 3. No facial asymmetry. No focal deficit. Moves all extremities spontaneously. Psych:  Responds to questions appropriately with a normal affect.  EKG:  The EKG was personally reviewed and demonstrates sinus bradycardia rate 50 no ischemic ST/T changes   Relevant CV Studies: No prior studies   Laboratory Data:  Chemistry Recent Labs  Lab 05/06/18 1720  NA 137  K 4.2  CL 109  CO2 22  GLUCOSE 211*  BUN 24*  CREATININE 1.31*  CALCIUM 9.0  GFRNONAA 53*  GFRAA >60  ANIONGAP 6    Recent Labs  Lab 05/06/18 1720  PROT 6.3*  ALBUMIN 3.9  AST 19  ALT 15  ALKPHOS 80  BILITOT 0.9   Hematology Recent Labs  Lab 05/06/18 1720  WBC 8.0  RBC 4.24  HGB 12.1*  HCT 37.6*  MCV 88.7  MCH 28.5  MCHC 32.2  RDW 14.0  PLT 167   Cardiac Enzymes Recent Labs  Lab 05/06/18 2319  TROPONINI <0.03    Recent Labs  Lab 05/06/18 1749 05/06/18 2147  TROPIPOC 0.00 0.00    BNPNo results for input(s): BNP, PROBNP in the last 168 hours.   DDimer No results for input(s): DDIMER in the last 168 hours.  Radiology/Studies:  Dg Chest Port 1 View  Result Date: 05/06/2018 CLINICAL DATA:  Weakness for couple of hours.  Near syncope EXAM: PORTABLE CHEST 1 VIEW COMPARISON:  07/19/2017 FINDINGS: Prominent heart size accentuated by low volumes. Negative mediastinal contours for technique. There is no edema, consolidation, effusion, or pneumothorax. Artifact from EKG leads. IMPRESSION: Low volume chest without acute finding. Electronically Signed   By: Marnee Spring M.D.   On: 05/06/2018 18:15    Assessment and Plan:   Beckem Tomberlin is a 72 y.o. male with a history of diabetes mellitus complicated by left below the knee amputation and peripheral neuropathy, chronic kidney disease, who is being seen today for the evaluation of pre-syncope, nausea, vertigo and bradycardia at the request of the general medicine team.  1. Sinus bradycardia 2. Dizziness, vertigo, nausea, diaphoresis He presents with sinus bradycardia. He reports relatively abrupt onset of diaphoresis, pre-syncope, vertiginous symptoms, arm tingling. Symptoms were not improved by moving from the standing to the seated position but were improved by moving to the laying down position initially. In the emergency room after moving from seated to standing he reports that he experienced marked vertigo (not pre-syncope) with some reflex bradycardia that may have been vagally mediated. Differential includes CNS process (vertebrobasilar ischemia), CV process (RCA ischemia with sinus bradycardia and nausea), autonomic dysfunction, vagal episodes. Less likely orthostatic hypotension as patient never documented to be hypotensive with orthostatic stress and this would typically manifest with reflex tachycardia.  - Would strongly consider brain MRI to evaluate for CNS process with vertigo and arm tingling - Obtain TTE; will need to consider ischemic evaluation with DM2 history, nausea, and  bradycardia which could be atypical presentation of CAD - Telemetry - Obtain orthostatic vital signs in the morning - Hold negative chronotropic agents   Cardiology will continue to follow.   For questions or updates, please contact [facility] Please consult [email or web address] for contact info under Cardiology/STEMI.    Signed, Laverda Page, MD  05/07/2018 1:44 AM

## 2018-05-07 NOTE — Consult Note (Signed)
Chief Complaint: Patient was seen in consultation today for cerebral arteriogram Chief Complaint  Patient presents with  . Weakness   at the request of Dr Feliz Beam  Supervising Physician: Julieanne Cotton  Patient Status: [facility] - In-pt  History of Present Illness: Kevin Bradford is a 72 y.o. male   Normal state of health til Oct 20 Felt dizzy; nausea; wobbly gait; weakness Light headed, vertigo Tingling right hand  To ED immediatly  MRI/MRA:  IMPRESSION: 1. Multiple small ischemic infarcts, including the right medulla oblongata and multiple foci within the right cerebellar hemisphere. No hemorrhage or mass effect. 2. Loss of flow related enhancement of the distal V3 segment and V4 segment of the right vertebral artery, consistent with occlusion. Magnetic susceptibility effect within the V4 segment on the SWI sequence suggests the presence of thrombus. 3. Otherwise normal MRA of the head and neck. 4. Findings of mild chronic microvascular ischemia  Now scheduled for diagnostic cerebral arteriogram in IR Dr Corliss Skains has reviewed imaging and approves procedure    Past Medical History:  Diagnosis Date  . Arthritis    "joint pain" (08/16/2017)  . Cellulitis and abscess of leg 10/18/2014  . CKD (chronic kidney disease) stage 3, GFR 30-59 ml/min (HCC)    creatinine increases with antibiotics per pt, no known kidney disease per patient  . Dehiscence of closure of skin, subsequent encounter   . Diabetic foot ulcer (HCC)    left  . Diabetic peripheral neuropathy (HCC)   . GERD (gastroesophageal reflux disease)    12/26/2017- "not this year."   . Osteomyelitis (HCC)    left transmetatarsal   . Type II diabetes mellitus (HCC)     Past Surgical History:  Procedure Laterality Date  . AMPUTATION Left 07/19/2017   Procedure: LEFT TRANSMETATARSAL AMPUTATION;  Surgeon: Nadara Mustard, MD;  Location: [facility] OR;  Service: Orthopedics;  Laterality: Left;  . AMPUTATION Left  08/16/2017   REVISION LEFT TRANSMETATARSAL AMPUTATION  . AMPUTATION Left 11/22/2017   Procedure: LEFT BELOW KNEE AMPUTATION;  Surgeon: Nadara Mustard, MD;  Location[facility] OR;  Service: Orthopedics;  Laterality: Left;  . CATARACT EXTRACTION W/ INTRAOCULAR LENS  IMPLANT, BILATERAL  ~ 2016  . EYE SURGERY Bilateral    catarct  . I&D EXTREMITY Left 10/17/2014   Procedure: IRRIGATION AND DEBRIDEMENT EXTREMITY LEFT FOOT;  Surgeon: Nadara Mustard, MD;  Location: [facility] OR;  Service: Orthopedics;  Laterality: Left;  . STUMP REVISION Left 08/16/2017   Procedure: REVISION LEFT TRANSMETATARSAL AMPUTATION;  Surgeon: Nadara Mustard, MD;  Location: [facility] OR;  Service: Orthopedics;  Laterality: Left;  . STUMP REVISION Left 12/27/2017   Procedure: LEFT BELOW KNEE AMPUTATION REVISION;  Surgeon: Nadara Mustard, MD;  Location: [facility] OR;  Service: Orthopedics;  Laterality: Left;  . TOE AMPUTATION Right 2013   5th toe    Allergies: Oxycodone  Medications: Prior to Admission medications   Medication Sig Start Date End Date Taking? Authorizing Provider  aspirin EC 81 MG tablet Take 162 mg by mouth 2 (two) times daily.   Yes [provider]  insulin aspart (NOVOLOG FLEXPEN) 100 UNIT/ML FlexPen Inject 4 Units into the skin 3 (three) times daily with meals. Patient taking differently: Inject 8 Units into the skin 3 (three) times daily with meals.  11/30/17  Yes Angiulli, Mcarthur Rossetti, PA-C  Insulin Detemir (LEVEMIR FLEXTOUCH) 100 UNIT/ML Pen Inject 25 Units into the skin daily at 10 pm. Patient taking differently: Inject 40 Units into the skin  daily at 10 pm.  11/30/17  Yes Angiulli, Mcarthur Rossetti, PA-C  metFORMIN (GLUCOPHAGE) 500 MG tablet Take 1 tablet (500 mg total) by mouth 2 (two) times daily with a meal. 11/30/17  Yes Angiulli, Mcarthur Rossetti, PA-C     Family History  Problem Relation Age of Onset  . Dementia Mother     Social History   Socioeconomic History  . Marital status: Married    Spouse name: Not on file  . Number  of children: Not on file  . Years of education: Not on file  . Highest education level: Not on file  Occupational History  . Not on file  Social Needs  . Financial resource strain: Not hard at all  . Food insecurity:    Worry: Never true    Inability: Never true  . Transportation needs:    Medical: No    Non-medical: No  Tobacco Use  . Smoking status: Never Smoker  . Smokeless tobacco: Never Used  Substance and Sexual Activity  . Alcohol use: Yes    Comment:  "maybe 1 beer 2 times a month"-12/26/2017  . Drug use: No  . Sexual activity: Yes  Lifestyle  . Physical activity:    Days per week: 5 days    Minutes per session: 120 min  . Stress: Not at all  Relationships  . Social connections:    Talks on phone: Three times a week    Gets together: Three times a week    Attends religious service: Never    Active member of club or organization: No    Attends meetings of clubs or organizations: Never    Relationship status: Married  Other Topics Concern  . Not on file  Social History Narrative  . Not on file    Review of Systems: A 12 point ROS discussed and pertinent positives are indicated in the HPI above.  All other systems are negative.  Review of Systems  Constitutional: Positive for activity change. Negative for fatigue and fever.  HENT: Negative for drooling, hearing loss, tinnitus and trouble swallowing.   Eyes: Negative for visual disturbance.  Respiratory: Negative for shortness of breath.   Cardiovascular: Negative for chest pain.  Gastrointestinal: Negative for abdominal pain.  Musculoskeletal: Negative for back pain.  Neurological: Positive for dizziness, weakness and light-headedness. Negative for tremors, seizures, syncope, facial asymmetry, speech difficulty, numbness and headaches.  Psychiatric/Behavioral: Negative for behavioral problems and confusion.    Vital Signs: BP (!) 157/104 (BP Location: Right Arm)   Pulse 64   Temp 97.9 F (36.6 C) (Oral)    Resp 16   Ht 6\' 2"  (1.88 m)   Wt 220 lb 14.4 oz (100.2 kg) Comment: bed scale  SpO2 97%   BMI 28.36 kg/m   Physical Exam  Constitutional: He is oriented to person, place, and time.  Cardiovascular: Normal rate, regular rhythm and normal heart sounds.  Pulmonary/Chest: Effort normal.  Abdominal: Soft. Bowel sounds are normal.  Musculoskeletal: Normal range of motion.  L BKA  Neurological: He is oriented to person, place, and time.  Skin: Skin is warm and dry.  Psychiatric: He has a normal mood and affect. His behavior is normal. Judgment and thought content normal.  Vitals reviewed.   Imaging: Mr Shirlee Latch ZO Contrast  Result Date: 05/07/2018 CLINICAL DATA:  Transient ischemic attack.  Near syncope. EXAM: MRI HEAD WITHOUT CONTRAST MRA HEAD WITHOUT CONTRAST MRA NECK WITHOUT CONTRAST TECHNIQUE: Multiplanar, multiecho pulse sequences of the brain and surrounding  structures were obtained without intravenous contrast. Angiographic images of the Circle of Willis were obtained using MRA technique without intravenous contrast. Angiographic images of the neck were obtained using MRA technique without intravenous contrast. Carotid stenosis measurements (when applicable) are obtained utilizing NASCET criteria, using the distal internal carotid diameter as the denominator. COMPARISON:  None. FINDINGS: MRI HEAD FINDINGS BRAIN: The midline structures are normal. Multifocal abnormal diffusion restriction within the right cerebellar hemisphere and in the right half of the medulla oblongata. No supratentorial diffusion restriction. Mild periventricular white matter hyperintensity. The CSF spaces are normal for age, with no hydrocephalus. Blood-sensitive sequences show no chronic microhemorrhage or superficial siderosis. There is magnetic susceptibility effect within the V4 segment of the right vertebral artery, which also has an abnormal flow void. SKULL AND UPPER CERVICAL SPINE: The visualized skull base,  calvarium, upper cervical spine and extracranial soft tissues are normal. SINUSES/ORBITS: No fluid levels or advanced mucosal thickening. No mastoid or middle ear effusion. The orbits are normal. MRA HEAD FINDINGS Intracranial internal carotid arteries: Normal. Anterior cerebral arteries: Normal. Middle cerebral arteries: Normal. Posterior communicating arteries: Present on the left. Posterior cerebral arteries: Normal. Basilar artery: Normal. Superior cerebellar arteries: Normal. Anterior inferior cerebellar arteries: Normal. Posterior inferior cerebellar arteries: Normal on the left. Loss of normal flow related enhancement distally on the right. The. MRA NECK FINDINGS Aortic arch: Normal 3 vessel aortic branching pattern. The visualized subclavian arteries are normal. Right carotid system: Normal course and caliber without stenosis or evidence of dissection. Left carotid system: Normal course and caliber without stenosis or evidence of dissection. Vertebral arteries: Left dominant. Vertebral artery origins are not visualized. There is loss of normal flow related enhancement of the right vertebral artery distal V3 segment and V4 segment. IMPRESSION: 1. Multiple small ischemic infarcts, including the right medulla oblongata and multiple foci within the right cerebellar hemisphere. No hemorrhage or mass effect. 2. Loss of flow related enhancement of the distal V3 segment and V4 segment of the right vertebral artery, consistent with occlusion. Magnetic susceptibility effect within the V4 segment on the SWI sequence suggests the presence of thrombus. 3. Otherwise normal MRA of the head and neck. 4. Findings of mild chronic microvascular ischemia. These results were communicated to Dr. Lyda Perone at 6:55 am on 05/07/2018 by text page via the [facility] messaging system. Electronically Signed   By: Deatra Robinson M.D.   On: 05/07/2018 06:56   Mr Maxine Glenn Neck Wo Contrast  Result Date: 05/07/2018 CLINICAL DATA:  Transient  ischemic attack.  Near syncope. EXAM: MRI HEAD WITHOUT CONTRAST MRA HEAD WITHOUT CONTRAST MRA NECK WITHOUT CONTRAST TECHNIQUE: Multiplanar, multiecho pulse sequences of the brain and surrounding structures were obtained without intravenous contrast. Angiographic images of the Circle of Willis were obtained using MRA technique without intravenous contrast. Angiographic images of the neck were obtained using MRA technique without intravenous contrast. Carotid stenosis measurements (when applicable) are obtained utilizing NASCET criteria, using the distal internal carotid diameter as the denominator. COMPARISON:  None. FINDINGS: MRI HEAD FINDINGS BRAIN: The midline structures are normal. Multifocal abnormal diffusion restriction within the right cerebellar hemisphere and in the right half of the medulla oblongata. No supratentorial diffusion restriction. Mild periventricular white matter hyperintensity. The CSF spaces are normal for age, with no hydrocephalus. Blood-sensitive sequences show no chronic microhemorrhage or superficial siderosis. There is magnetic susceptibility effect within the V4 segment of the right vertebral artery, which also has an abnormal flow void. SKULL AND UPPER CERVICAL SPINE: The visualized skull base,  calvarium, upper cervical spine and extracranial soft tissues are normal. SINUSES/ORBITS: No fluid levels or advanced mucosal thickening. No mastoid or middle ear effusion. The orbits are normal. MRA HEAD FINDINGS Intracranial internal carotid arteries: Normal. Anterior cerebral arteries: Normal. Middle cerebral arteries: Normal. Posterior communicating arteries: Present on the left. Posterior cerebral arteries: Normal. Basilar artery: Normal. Superior cerebellar arteries: Normal. Anterior inferior cerebellar arteries: Normal. Posterior inferior cerebellar arteries: Normal on the left. Loss of normal flow related enhancement distally on the right. The. MRA NECK FINDINGS Aortic arch: Normal 3  vessel aortic branching pattern. The visualized subclavian arteries are normal. Right carotid system: Normal course and caliber without stenosis or evidence of dissection. Left carotid system: Normal course and caliber without stenosis or evidence of dissection. Vertebral arteries: Left dominant. Vertebral artery origins are not visualized. There is loss of normal flow related enhancement of the right vertebral artery distal V3 segment and V4 segment. IMPRESSION: 1. Multiple small ischemic infarcts, including the right medulla oblongata and multiple foci within the right cerebellar hemisphere. No hemorrhage or mass effect. 2. Loss of flow related enhancement of the distal V3 segment and V4 segment of the right vertebral artery, consistent with occlusion. Magnetic susceptibility effect within the V4 segment on the SWI sequence suggests the presence of thrombus. 3. Otherwise normal MRA of the head and neck. 4. Findings of mild chronic microvascular ischemia. These results were communicated to Dr. Lyda Perone at 6:55 am on 05/07/2018 by text page via the [facility] messaging system. Electronically Signed   By: Deatra Robinson M.D.   On: 05/07/2018 06:56   Mr Brain Wo Contrast  Result Date: 05/07/2018 CLINICAL DATA:  Transient ischemic attack.  Near syncope. EXAM: MRI HEAD WITHOUT CONTRAST MRA HEAD WITHOUT CONTRAST MRA NECK WITHOUT CONTRAST TECHNIQUE: Multiplanar, multiecho pulse sequences of the brain and surrounding structures were obtained without intravenous contrast. Angiographic images of the Circle of Willis were obtained using MRA technique without intravenous contrast. Angiographic images of the neck were obtained using MRA technique without intravenous contrast. Carotid stenosis measurements (when applicable) are obtained utilizing NASCET criteria, using the distal internal carotid diameter as the denominator. COMPARISON:  None. FINDINGS: MRI HEAD FINDINGS BRAIN: The midline structures are normal. Multifocal  abnormal diffusion restriction within the right cerebellar hemisphere and in the right half of the medulla oblongata. No supratentorial diffusion restriction. Mild periventricular white matter hyperintensity. The CSF spaces are normal for age, with no hydrocephalus. Blood-sensitive sequences show no chronic microhemorrhage or superficial siderosis. There is magnetic susceptibility effect within the V4 segment of the right vertebral artery, which also has an abnormal flow void. SKULL AND UPPER CERVICAL SPINE: The visualized skull base, calvarium, upper cervical spine and extracranial soft tissues are normal. SINUSES/ORBITS: No fluid levels or advanced mucosal thickening. No mastoid or middle ear effusion. The orbits are normal. MRA HEAD FINDINGS Intracranial internal carotid arteries: Normal. Anterior cerebral arteries: Normal. Middle cerebral arteries: Normal. Posterior communicating arteries: Present on the left. Posterior cerebral arteries: Normal. Basilar artery: Normal. Superior cerebellar arteries: Normal. Anterior inferior cerebellar arteries: Normal. Posterior inferior cerebellar arteries: Normal on the left. Loss of normal flow related enhancement distally on the right. The. MRA NECK FINDINGS Aortic arch: Normal 3 vessel aortic branching pattern. The visualized subclavian arteries are normal. Right carotid system: Normal course and caliber without stenosis or evidence of dissection. Left carotid system: Normal course and caliber without stenosis or evidence of dissection. Vertebral arteries: Left dominant. Vertebral artery origins are not visualized. There is loss of  normal flow related enhancement of the right vertebral artery distal V3 segment and V4 segment. IMPRESSION: 1. Multiple small ischemic infarcts, including the right medulla oblongata and multiple foci within the right cerebellar hemisphere. No hemorrhage or mass effect. 2. Loss of flow related enhancement of the distal V3 segment and V4 segment  of the right vertebral artery, consistent with occlusion. Magnetic susceptibility effect within the V4 segment on the SWI sequence suggests the presence of thrombus. 3. Otherwise normal MRA of the head and neck. 4. Findings of mild chronic microvascular ischemia. These results were communicated to Dr. Lyda Perone at 6:55 am on 05/07/2018 by text page via the [facility] messaging system. Electronically Signed   By: Deatra Robinson M.D.   On: 05/07/2018 06:56   Dg Chest Port 1 View  Result Date: 05/06/2018 CLINICAL DATA:  Weakness for couple of hours.  Near syncope EXAM: PORTABLE CHEST 1 VIEW COMPARISON:  07/19/2017 FINDINGS: Prominent heart size accentuated by low volumes. Negative mediastinal contours for technique. There is no edema, consolidation, effusion, or pneumothorax. Artifact from EKG leads. IMPRESSION: Low volume chest without acute finding. Electronically Signed   By: Marnee Spring M.D.   On: 05/06/2018 18:15    Labs:  CBC: Recent Labs    11/22/17 1224 11/27/17 0817 12/27/17 1114 05/06/18 1720  WBC 8.1 5.4 5.9 8.0  HGB 12.2* 12.2* 12.2* 12.1*  HCT 37.4* 37.1* 38.7* 37.6*  PLT 216 236 160 167    COAGS: Recent Labs    08/16/17 0758  INR 1.01  APTT 30    BMP: Recent Labs    11/22/17 1224 11/27/17 0817 12/27/17 1115 05/06/18 1720  NA 141 140 138 137  K 4.2 4.1 4.5 4.2  CL 110 106 109 109  CO2 22 27 21* 22  GLUCOSE 153* 150* 157* 211*  BUN 20 18 28* 24*  CALCIUM 8.8* 9.3 9.1 9.0  CREATININE 1.28* 1.30* 1.36* 1.31*  GFRNONAA 54* 53* 50* 53*  GFRAA >60 >60 58* >60    LIVER FUNCTION TESTS: Recent Labs    08/16/17 0758 11/27/17 0817 05/06/18 1720  BILITOT 0.7 0.7 0.9  AST 18 20 19   ALT 12* 11* 15  ALKPHOS 76 70 80  PROT 6.9 6.5 6.3*  ALBUMIN 3.6 3.5 3.9    TUMOR MARKERS: No results for input(s): AFPTM, CEA, CA199, CHROMGRNA in the last 8760 hours.  Assessment and Plan:  CVA Abnormal MRI/MRA Vertebral artery stenosis/occlusion Scheduled for  diagnostic cerebral arteriogram today-- possible intervention later date Risks and benefits of cerebral angiogram with intervention were discussed with the patient including, but not limited to bleeding, infection, vascular injury, contrast induced renal failure, stroke or even death.  This interventional procedure involves the use of X-rays and because of the nature of the planned procedure, it is possible that we will have prolonged use of X-ray fluoroscopy.  Potential radiation risks to you include (but are not limited to) the following: - A slightly elevated risk for cancer  several years later in life. This risk is typically less than 0.5% percent. This risk is low in comparison to the normal incidence of human cancer, which is 33% for women and 50% for men according to the [employer]. - Radiation induced injury can include skin redness, resembling a rash, tissue breakdown / ulcers and hair loss (which can be temporary or permanent).   The likelihood of either of these occurring depends on the difficulty of the procedure and whether you are sensitive to radiation due  to previous procedures, disease, or genetic conditions.   IF your procedure requires a prolonged use of radiation, you will be notified and given written instructions for further action.  It is your responsibility to monitor the irradiated area for the 2 weeks following the procedure and to notify your physician if you are concerned that you have suffered a radiation induced injury.    All of the patient's questions were answered, patient is agreeable to proceed.  Consent signed and in chart.  Thank you for this interesting consult.  I greatly enjoyed meeting Kevin Bradford and look forward to participating in their care.  A copy of this report was sent to the requesting provider on this date.  Electronically Signed: Robet Leu, PA-C 05/07/2018, 9:09 AM   I spent a total of 40 Minutes    in face to face  in clinical consultation, greater than 50% of which was counseling/coordinating care for cerebral arteriogram

## 2018-05-07 NOTE — Consult Note (Addendum)
Neurology Consultation  Reason for Consult: Stroke Referring Physician: Willette Pa  CC: Right-sided gait disturbance found to have cerebellar strokes  History is obtained from: Patient and family  HPI: Kevin Bradford is a 72 y.o. male type 2 diabetes.  Patient states that yesterday he noted around 3 PM that he suddenly was having difficulty with his balance and felt dizzy/nausea/sweating.  Patient noted that when he laid down he did feel better.  Family noted that he was listing to the right side and he possibly had a right facial droop.  Patient was brought immediately to the emergency room where MRI of brain and MRA of neck was obtained.  MRI did show 3 strokes in the cerebellum on the right in addition to a occluded right vertebral artery.   ED course  -Blood glucose was in the 200s while in the ED. -Orthostatic hypotension which resolved with fluids - A single episode of bradycardia which again improved with fluids     LKW: 1500 hrs. on 10 11/05/2017 tpa given?: no, minimal symptoms along with out of window Premorbid modified Rankin scale (mRS): 0 NIHSS 0  ROS: A 14 point ROS was performed and is negative except as noted in the HPI.  Past Medical History:  Diagnosis Date  . Arthritis    "joint pain" (08/16/2017)  . Cellulitis and abscess of leg 10/18/2014  . CKD (chronic kidney disease) stage 3, GFR 30-59 ml/min (HCC)    creatinine increases with antibiotics per pt, no known kidney disease per patient  . Dehiscence of closure of skin, subsequent encounter   . Diabetic foot ulcer (HCC)    left  . Diabetic peripheral neuropathy (HCC)   . GERD (gastroesophageal reflux disease)    12/26/2017- "not this year."   . Osteomyelitis (HCC)    left transmetatarsal   . Type II diabetes mellitus (HCC)      Family History  Problem Relation Age of Onset  . Dementia Mother      Social History:   reports that he has never smoked. He has never used smokeless tobacco. He reports that he  drinks alcohol. He reports that he does not use drugs.  Medications  Current Facility-Administered Medications:  .  alum & mag hydroxide-simeth (MAALOX/MYLANTA) 200-200-20 MG/5ML suspension 30 mL, 30 mL, Oral, Q6H PRN, Bodenheimer, Charles A, NP, 30 mL at 05/07/18 0052 .  aspirin EC tablet 81 mg, 81 mg, Oral, Daily, Hillary Bow, DO, 81 mg at 05/07/18 1610 .  enoxaparin (LOVENOX) injection 40 mg, 40 mg, Subcutaneous, Q24H, Julian Reil, Jared M, DO, Stopped at 05/07/18 0919 .  insulin aspart (novoLOG) injection 0-15 Units, 0-15 Units, Subcutaneous, TID WC, Hillary Bow, DO, 2 Units at 05/07/18 1159 .  insulin aspart (novoLOG) injection 4 Units, 4 Units, Subcutaneous, TID WC, Hillary Bow, DO, 4 Units at 05/07/18 0809 .  insulin detemir (LEVEMIR) injection 30 Units, 30 Units, Subcutaneous, QHS, Hillary Bow, DO, 30 Units at 05/07/18 0053 .  sodium chloride flush (NS) 0.9 % injection 3 mL, 3 mL, Intravenous, Q12H, Julian Reil, Jared M, DO, 3 mL at 05/07/18 9604   Exam: Current vital signs: BP (!) 160/86 (BP Location: Right Arm)   Pulse 83   Temp 97.8 F (36.6 C) (Oral)   Resp 18   Ht 6\' 2"  (1.88 m)   Wt 100.2 kg Comment: bed scale  SpO2 97%   BMI 28.36 kg/m  Vital signs in last 24 hours: Temp:  [96.1 F (35.6 C)-97.9 F (36.6 C)]  97.8 F (36.6 C) (10/21 1155) Pulse Rate:  [48-83] 83 (10/21 1155) Resp:  [11-18] 18 (10/21 1155) BP: (138-164)/(68-104) 160/86 (10/21 1155) SpO2:  [14 %-100 %] 97 % (10/21 1155) Weight:  [99.8 kg-100.2 kg] 100.2 kg (10/20 2252)  Physical Exam  Constitutional: Appears well-developed and well-nourished.  Psych: Affect appropriate to situation Eyes: No scleral injection HENT: No OP obstrucion Head: Normocephalic.  Cardiovascular: Normal rate and regular rhythm.  Respiratory: Effort normal, non-labored breathing GI: Soft.  No distension. There is no tenderness.  Skin: WDI  Neuro: Mental Status: Patient is awake, alert, oriented to person,  place, month, year, and situation. Patient is able to give a clear and coherent history. No signs of aphasia or neglect Cranial Nerves: II: Visual Fields are full. Pupils are equal, round, and reactive to light.   III,IV, VI: EOMI without ptosis or diploplia.  V: Facial sensation is symmetric to temperature VII: Facial movement is symmetric.  VIII: hearing is intact to voice X: Uvula elevates symmetrically XI: Shoulder shrug is symmetric. XII: tongue is midline without atrophy or fasciculations.  Motor: Tone is normal. Bulk is normal. 5/5 strength was present in all four extremities.  Left below-knee amputation Sensory: Sensation is symmetric to light touch and temperature in the arms and legs. Deep Tendon Reflexes: 2+ and symmetric in the biceps and patellae.  No Achilles reflex on the right leg Plantars: Toes are downgoing on the right Cerebellar: FNF     Labs I have reviewed labs in epic and the results pertinent to this consultation are:   CBC    Component Value Date/Time   WBC 8.0 05/06/2018 1720   RBC 4.24 05/06/2018 1720   HGB 12.1 (L) 05/06/2018 1720   HCT 37.6 (L) 05/06/2018 1720   PLT 167 05/06/2018 1720   MCV 88.7 05/06/2018 1720   MCH 28.5 05/06/2018 1720   MCHC 32.2 05/06/2018 1720   RDW 14.0 05/06/2018 1720   LYMPHSABS 1.2 05/06/2018 1720   MONOABS 0.5 05/06/2018 1720   EOSABS 0.0 05/06/2018 1720   BASOSABS 0.0 05/06/2018 1720    CMP     Component Value Date/Time   NA 137 05/06/2018 1720   K 4.2 05/06/2018 1720   CL 109 05/06/2018 1720   CO2 22 05/06/2018 1720   GLUCOSE 211 (H) 05/06/2018 1720   BUN 24 (H) 05/06/2018 1720   CREATININE 1.31 (H) 05/06/2018 1720   CALCIUM 9.0 05/06/2018 1720   PROT 6.3 (L) 05/06/2018 1720   ALBUMIN 3.9 05/06/2018 1720   AST 19 05/06/2018 1720   ALT 15 05/06/2018 1720   ALKPHOS 80 05/06/2018 1720   BILITOT 0.9 05/06/2018 1720   GFRNONAA 53 (L) 05/06/2018 1720   GFRAA >60 05/06/2018 1720    Lipid Panel      Component Value Date/Time   CHOL 145 10/18/2014 0450   TRIG 166 (H) 10/18/2014 0450   HDL 21 (L) 10/18/2014 0450   CHOLHDL 6.9 10/18/2014 0450   VLDL 33 10/18/2014 0450   LDLCALC 91 10/18/2014 0450     Imaging I have reviewed the images obtained:    MRI examination of the brain--multiple small ischemic infarcts including right mid Dula oblongata and multiple foci within the right cerebellum: Loss of flow related enhancement of the distal V3 segment and V4 segment of the right vertebral artery consistent with occlusion.  Otherwise normal MRA of head and neck  Echo: EF 60-65% with no hypokinesis  LDL and A1c pending   Felicie Morn PA-C [facility] [phone]  M-F  (9:00 am- 5:00 PM)  05/07/2018, 1:30 PM     Assessment:  72 year old male presenting to hospital secondary to gait instability/nausea.  Patient found to have posterior circulation infarcts on MRI brain with an occluded right vertebral artery on MR Angiogram.  Likely atheroembolic.  Patient underwent a diagnostic cerebral angiogram which confirmed occluded right vertebral artery and 60% stenosis of the left vertebral.   Acute Ischemic Stroke   Risk factors : DM Etiology: likely atheroembolic, vs cardioembolic  Recommend #Transthoracic Echo  # Dual antiplatelets x 3 weeks ( ASA 81mg  +plavix 75mg  ) #Start or continue Atorvastatin 40 mg/other high intensity statin # BP goal: permissive HTN upto 185/110 with normotension 24 to 48 hours  # HBAIC and Lipid profile # Telemetry monitoring # Frequent neuro checks # stroke swallow screen    Please page stroke NP  Or  PA  Or MD from 8am -4 pm  as this patient from this time will be  followed by the stroke.   You can look them up on [email or web address]  Password [email or web address]   NEUROHOSPITALIST ADDENDUM Performed a face to face diagnostic evaluation.   I have reviewed the contents of history and physical exam as documented by PA/ARNP/Resident and agree with above  documentation.  I have discussed and formulated the above plan as documented. Edits to the note have been made as needed.  Patient with diabetes mellitus presents with acute onset gait instability, nausea and dizziness.  Also possibly mild facial droop on the right side and leaning towards the right.  Initially worked up for syncope, cardiology recommended MRI which demonstrated an acute stroke.  MRA showed acute vertebral artery occlusion-likely atheroembolic in the setting of atherosclerotic disease.  Patient underwent a cerebral angiogram which confirmed the occlusion.  Vertebral artery occlusions usually do not require any surgical intervention or stenting unless  patient has multiple events despite maximal medical therapy.  Recommend dual antiplatelets for at least 3 weeks, possibly 3 months.  High-dose statin and blood pressure control.  Also recommended diet and exercise.   Stroke team to follow.   Georgiana Spinner Aroor MD [facility] [phone]   If 7pm to 7am, please call on call as listed on AMION.

## 2018-05-07 NOTE — Progress Notes (Addendum)
[facility] History and physical examination  Hammad Finkler WUJ:[id number] DOB: Sep 08, 1945 DOA: 05/06/2018 PCP: Georgann Housekeeper, MD  Assessment/Plan:  1.  Stroke/Dizziness, vertigo, nausea, diaphoresis He presented with sinus bradycardia. He reported relatively abrupt onset of diaphoresis, pre-syncope, vertiginous symptoms, arm tingling. MRI/MRA obtained revealing posterior circulation infarcts with on occluded right vertebral artery. IR to schedule diagnositic cerebral arteriogram for today. Evaluated by neuro who recommend plavix and PT/OT -lipid panel -consider plavix once procedure complete -PT/OT -appreciate neuro assistance  2. Sinus bradycardia. Resolved with fluids. Evaluated by cardiology who opined likely related to above and vagal inputs.  -appreciate cards assistance  3. DM. A1c 11/2017 6.7. -continue home meds -SSI  4. CKD stage 3. Stable at baseline -hold nephrotoxins -intake and output     Code Status: full Family Communication: wife at bedside Disposition Plan: home   Consultants:  deveshwar IR  Felicie Morn neurology  Procedures:  Cerebral arteriogram  Antibiotics:  none  Chief complaint: Healing wobbly with vertigo began this prior to presentation   HPI/Subjective: The patient reported that he was in his usual state of health on day of admission until approximately 3:15 pm the patient had just finished some outdoors work replacing an outside faucet, he went upstairs to the kitchen area where there were 4-5 other people around the table, his wife's son had fixed some food. He sat down and ate several fried snacks. He walked  over to the kitchen sink and felt "wobbly" (vertigo and pre-syncope) on both legs including his good leg. He held onto the counter top and requested a chair because he felt like he was about to pass out. He sat down on the chair with assistance and began to sweat profusely and feel nauseated with tingling in the right hand.  Over the course of the following 20 minutes he was transferred to an office chair with wheels and layed him down. He arrived to the emergency department with ongoing nausea. He would become extremely dizzy every time he stood although orthostatic hypotension not documented.The tinging in his right hand had subsided. He continued to feel light headed (vertigo). His ongoing symptoms at this time include indigestion.   Past Medical History:  Diagnosis Date  . Arthritis    "joint pain" (08/16/2017)  . Cellulitis and abscess of leg 10/18/2014  . CKD (chronic kidney disease) stage 3, GFR 30-59 ml/min (HCC)    creatinine increases with antibiotics per pt, no known kidney disease per patient  . Dehiscence of closure of skin, subsequent encounter   . Diabetic foot ulcer (HCC)    left  . Diabetic peripheral neuropathy (HCC)   . GERD (gastroesophageal reflux disease)    12/26/2017- "not this year."   . Osteomyelitis (HCC)    left transmetatarsal   . Stroke (HCC)   . Type II diabetes mellitus (HCC)    Past Surgical History:  Procedure Laterality Date  . AMPUTATION Left 07/19/2017   Procedure: LEFT TRANSMETATARSAL AMPUTATION;  Surgeon: Nadara Mustard, MD;  Location[facility] OR;  Service: Orthopedics;  Laterality: Left;  . AMPUTATION Left 08/16/2017   REVISION LEFT TRANSMETATARSAL AMPUTATION  . AMPUTATION Left 11/22/2017   Procedure: LEFT BELOW KNEE AMPUTATION;  Surgeon: Nadara Mustard, MD;  Location: [facility] OR;  Service: Orthopedics;  Laterality: Left;  . CATARACT EXTRACTION W/ INTRAOCULAR LENS  IMPLANT, BILATERAL  ~ 2016  . EYE SURGERY Bilateral    catarct  . I&D EXTREMITY Left 10/17/2014   Procedure: IRRIGATION AND DEBRIDEMENT EXTREMITY LEFT FOOT;  Surgeon: Nadara Mustard, MD;  Location: [facility] OR;  Service: Orthopedics;  Laterality: Left;  . STUMP REVISION Left 08/16/2017   Procedure: REVISION LEFT TRANSMETATARSAL AMPUTATION;  Surgeon: Nadara Mustard, MD;  Location: [facility] OR;  Service: Orthopedics;  Laterality: Left;   . STUMP REVISION Left 12/27/2017   Procedure: LEFT BELOW KNEE AMPUTATION REVISION;  Surgeon: Nadara Mustard, MD;  Location: [facility] OR;  Service: Orthopedics;  Laterality: Left;  . TOE AMPUTATION Right 2013   5th toe    Family History  Problem Relation Age of Onset  . Dementia Mother     Social History   Socioeconomic History  . Marital status: Married    Spouse name: Not on file  . Number of children: Not on file  . Years of education: Not on file  . Highest education level: Not on file  Occupational History  . Not on file  Social Needs  . Financial resource strain: Not hard at all  . Food insecurity:    Worry: Never true    Inability: Never true  . Transportation needs:    Medical: No    Non-medical: No  Tobacco Use  . Smoking status: Never Smoker  . Smokeless tobacco: Never Used  Substance and Sexual Activity  . Alcohol use: Yes    Comment:  "maybe 1 beer 2 times a month"-12/26/2017  . Drug use: No  . Sexual activity: Yes  Lifestyle  . Physical activity:    Days per week: 5 days    Minutes per session: 120 min  . Stress: Not at all  Relationships  . Social connections:    Talks on phone: Three times a week    Gets together: Three times a week    Attends religious service: Never    Active member of club or organization: No    Attends meetings of clubs or organizations: Never    Relationship status: Married  . Intimate partner violence:    Fear of current or ex partner: No    Emotionally abused: No    Physically abused: No    Forced sexual activity: No  Other Topics Concern  . Not on file  Social History Narrative  . Not on file         Objective: Vitals:   05/07/18 1525 05/07/18 1530  BP: (!) 143/81 (!) 150/82  Pulse: 79 78  Resp:  (!) 9  Temp:    SpO2: 100% 100%    Intake/Output Summary (Last 24 hours) at 05/07/2018 1543 Last data filed at 05/06/2018 2217 Gross per 24 hour  Intake 2000.02 ml  Output -  Net 2000.02 ml   Filed Weights    05/06/18 1711 05/06/18 2252  Weight: 99.8 kg 100.2 kg    Exam:   General:  Awake alert well nourished no acute distress  Skin: Warm and dry without rashes masses or nodules  HEENT: See AT PERRLA EOMI conjunctiva are pale sclera are white mucous memories are dry  Nodes: No anterior posterior chain axillary supraclavicular groin lymphadenopathy appreciated  Cardiovascular: rrr no MGR no LE edema  Respiratory: normal effort BS clear bilaterally nowheeze  Abdomen: obese soft +BS no guarding or rebounding  Musculoskeletal: Left BKA healed. Joints without swelling/erythema.   Neuro: alert oriented x3. Speech clear facial symmetry  Psychiatric: Mood is normal affect is variable  Data Reviewed: Basic Metabolic Panel: Recent Labs  Lab 05/06/18 1720  NA 137  K 4.2  CL 109  CO2 22  GLUCOSE 211*  BUN 24*  CREATININE 1.31*  CALCIUM 9.0   Liver Function Tests: Recent Labs  Lab 05/06/18 1720  AST 19  ALT 15  ALKPHOS 80  BILITOT 0.9  PROT 6.3*  ALBUMIN 3.9   No results for input(s): LIPASE, AMYLASE in the last 168 hours. No results for input(s): AMMONIA in the last 168 hours. CBC: Recent Labs  Lab 05/06/18 1720  WBC 8.0  NEUTROABS 6.2  HGB 12.1*  HCT 37.6*  MCV 88.7  PLT 167   Cardiac Enzymes: Recent Labs  Lab 05/06/18 2319 05/07/18 0423 05/07/18 1025  TROPONINI <0.03 <0.03 0.17*   BNP (last 3 results) No results for input(s): BNP in the last 8760 hours.  ProBNP (last 3 results) No results for input(s): PROBNP in the last 8760 hours.  CBG: Recent Labs  Lab 05/06/18 2258 05/07/18 0741 05/07/18 1151  GLUCAP 247* 198* 131*    Recent Results (from the past 240 hour(s))  MRSA PCR Screening     Status: None   Collection Time: 05/06/18 10:58 PM  Result Value Ref Range Status   MRSA by PCR NEGATIVE NEGATIVE Final    Comment:        The GeneXpert MRSA Assay (FDA approved for NASAL specimens only), is one component of a comprehensive MRSA  colonization surveillance program. It is not intended to diagnose MRSA infection nor to guide or monitor treatment for MRSA infections. Performed at [facility], [address] [place], [place] [address]      Studies: Mr Shirlee Latch UE Contrast  Result Date: 05/07/2018 CLINICAL DATA:  Transient ischemic attack.  Near syncope. EXAM: MRI HEAD WITHOUT CONTRAST MRA HEAD WITHOUT CONTRAST MRA NECK WITHOUT CONTRAST TECHNIQUE: Multiplanar, multiecho pulse sequences of the brain and surrounding structures were obtained without intravenous contrast. Angiographic images of the Circle of Willis were obtained using MRA technique without intravenous contrast. Angiographic images of the neck were obtained using MRA technique without intravenous contrast. Carotid stenosis measurements (when applicable) are obtained utilizing NASCET criteria, using the distal internal carotid diameter as the denominator. COMPARISON:  None. FINDINGS: MRI HEAD FINDINGS BRAIN: The midline structures are normal. Multifocal abnormal diffusion restriction within the right cerebellar hemisphere and in the right half of the medulla oblongata. No supratentorial diffusion restriction. Mild periventricular white matter hyperintensity. The CSF spaces are normal for age, with no hydrocephalus. Blood-sensitive sequences show no chronic microhemorrhage or superficial siderosis. There is magnetic susceptibility effect within the V4 segment of the right vertebral artery, which also has an abnormal flow void. SKULL AND UPPER CERVICAL SPINE: The visualized skull base, calvarium, upper cervical spine and extracranial soft tissues are normal. SINUSES/ORBITS: No fluid levels or advanced mucosal thickening. No mastoid or middle ear effusion. The orbits are normal. MRA HEAD FINDINGS Intracranial internal carotid arteries: Normal. Anterior cerebral arteries: Normal. Middle cerebral arteries: Normal. Posterior communicating arteries: Present on the left.  Posterior cerebral arteries: Normal. Basilar artery: Normal. Superior cerebellar arteries: Normal. Anterior inferior cerebellar arteries: Normal. Posterior inferior cerebellar arteries: Normal on the left. Loss of normal flow related enhancement distally on the right. The. MRA NECK FINDINGS Aortic arch: Normal 3 vessel aortic branching pattern. The visualized subclavian arteries are normal. Right carotid system: Normal course and caliber without stenosis or evidence of dissection. Left carotid system: Normal course and caliber without stenosis or evidence of dissection. Vertebral arteries: Left dominant. Vertebral artery origins are not visualized. There is loss of normal flow related enhancement of the right vertebral artery distal V3 segment  and V4 segment. IMPRESSION: 1. Multiple small ischemic infarcts, including the right medulla oblongata and multiple foci within the right cerebellar hemisphere. No hemorrhage or mass effect. 2. Loss of flow related enhancement of the distal V3 segment and V4 segment of the right vertebral artery, consistent with occlusion. Magnetic susceptibility effect within the V4 segment on the SWI sequence suggests the presence of thrombus. 3. Otherwise normal MRA of the head and neck. 4. Findings of mild chronic microvascular ischemia. These results were communicated to Dr. Lyda Perone at 6:55 am on 05/07/2018 by text page via the [facility] messaging system. Electronically Signed   By: Deatra Robinson M.D.   On: 05/07/2018 06:56   Mr Maxine Glenn Neck Wo Contrast  Result Date: 05/07/2018 CLINICAL DATA:  Transient ischemic attack.  Near syncope. EXAM: MRI HEAD WITHOUT CONTRAST MRA HEAD WITHOUT CONTRAST MRA NECK WITHOUT CONTRAST TECHNIQUE: Multiplanar, multiecho pulse sequences of the brain and surrounding structures were obtained without intravenous contrast. Angiographic images of the Circle of Willis were obtained using MRA technique without intravenous contrast. Angiographic images of the neck  were obtained using MRA technique without intravenous contrast. Carotid stenosis measurements (when applicable) are obtained utilizing NASCET criteria, using the distal internal carotid diameter as the denominator. COMPARISON:  None. FINDINGS: MRI HEAD FINDINGS BRAIN: The midline structures are normal. Multifocal abnormal diffusion restriction within the right cerebellar hemisphere and in the right half of the medulla oblongata. No supratentorial diffusion restriction. Mild periventricular white matter hyperintensity. The CSF spaces are normal for age, with no hydrocephalus. Blood-sensitive sequences show no chronic microhemorrhage or superficial siderosis. There is magnetic susceptibility effect within the V4 segment of the right vertebral artery, which also has an abnormal flow void. SKULL AND UPPER CERVICAL SPINE: The visualized skull base, calvarium, upper cervical spine and extracranial soft tissues are normal. SINUSES/ORBITS: No fluid levels or advanced mucosal thickening. No mastoid or middle ear effusion. The orbits are normal. MRA HEAD FINDINGS Intracranial internal carotid arteries: Normal. Anterior cerebral arteries: Normal. Middle cerebral arteries: Normal. Posterior communicating arteries: Present on the left. Posterior cerebral arteries: Normal. Basilar artery: Normal. Superior cerebellar arteries: Normal. Anterior inferior cerebellar arteries: Normal. Posterior inferior cerebellar arteries: Normal on the left. Loss of normal flow related enhancement distally on the right. The. MRA NECK FINDINGS Aortic arch: Normal 3 vessel aortic branching pattern. The visualized subclavian arteries are normal. Right carotid system: Normal course and caliber without stenosis or evidence of dissection. Left carotid system: Normal course and caliber without stenosis or evidence of dissection. Vertebral arteries: Left dominant. Vertebral artery origins are not visualized. There is loss of normal flow related enhancement  of the right vertebral artery distal V3 segment and V4 segment. IMPRESSION: 1. Multiple small ischemic infarcts, including the right medulla oblongata and multiple foci within the right cerebellar hemisphere. No hemorrhage or mass effect. 2. Loss of flow related enhancement of the distal V3 segment and V4 segment of the right vertebral artery, consistent with occlusion. Magnetic susceptibility effect within the V4 segment on the SWI sequence suggests the presence of thrombus. 3. Otherwise normal MRA of the head and neck. 4. Findings of mild chronic microvascular ischemia. These results were communicated to Dr. Lyda Perone at 6:55 am on 05/07/2018 by text page via the [facility] messaging system. Electronically Signed   By: Deatra Robinson M.D.   On: 05/07/2018 06:56   Mr Brain Wo Contrast  Result Date: 05/07/2018 CLINICAL DATA:  Transient ischemic attack.  Near syncope. EXAM: MRI HEAD WITHOUT CONTRAST MRA HEAD  WITHOUT CONTRAST MRA NECK WITHOUT CONTRAST TECHNIQUE: Multiplanar, multiecho pulse sequences of the brain and surrounding structures were obtained without intravenous contrast. Angiographic images of the Circle of Willis were obtained using MRA technique without intravenous contrast. Angiographic images of the neck were obtained using MRA technique without intravenous contrast. Carotid stenosis measurements (when applicable) are obtained utilizing NASCET criteria, using the distal internal carotid diameter as the denominator. COMPARISON:  None. FINDINGS: MRI HEAD FINDINGS BRAIN: The midline structures are normal. Multifocal abnormal diffusion restriction within the right cerebellar hemisphere and in the right half of the medulla oblongata. No supratentorial diffusion restriction. Mild periventricular white matter hyperintensity. The CSF spaces are normal for age, with no hydrocephalus. Blood-sensitive sequences show no chronic microhemorrhage or superficial siderosis. There is magnetic susceptibility effect  within the V4 segment of the right vertebral artery, which also has an abnormal flow void. SKULL AND UPPER CERVICAL SPINE: The visualized skull base, calvarium, upper cervical spine and extracranial soft tissues are normal. SINUSES/ORBITS: No fluid levels or advanced mucosal thickening. No mastoid or middle ear effusion. The orbits are normal. MRA HEAD FINDINGS Intracranial internal carotid arteries: Normal. Anterior cerebral arteries: Normal. Middle cerebral arteries: Normal. Posterior communicating arteries: Present on the left. Posterior cerebral arteries: Normal. Basilar artery: Normal. Superior cerebellar arteries: Normal. Anterior inferior cerebellar arteries: Normal. Posterior inferior cerebellar arteries: Normal on the left. Loss of normal flow related enhancement distally on the right. The. MRA NECK FINDINGS Aortic arch: Normal 3 vessel aortic branching pattern. The visualized subclavian arteries are normal. Right carotid system: Normal course and caliber without stenosis or evidence of dissection. Left carotid system: Normal course and caliber without stenosis or evidence of dissection. Vertebral arteries: Left dominant. Vertebral artery origins are not visualized. There is loss of normal flow related enhancement of the right vertebral artery distal V3 segment and V4 segment. IMPRESSION: 1. Multiple small ischemic infarcts, including the right medulla oblongata and multiple foci within the right cerebellar hemisphere. No hemorrhage or mass effect. 2. Loss of flow related enhancement of the distal V3 segment and V4 segment of the right vertebral artery, consistent with occlusion. Magnetic susceptibility effect within the V4 segment on the SWI sequence suggests the presence of thrombus. 3. Otherwise normal MRA of the head and neck. 4. Findings of mild chronic microvascular ischemia. These results were communicated to Dr. Lyda Perone at 6:55 am on 05/07/2018 by text page via the [facility] messaging system.  Electronically Signed   By: Deatra Robinson M.D.   On: 05/07/2018 06:56   Dg Chest Port 1 View  Result Date: 05/06/2018 CLINICAL DATA:  Weakness for couple of hours.  Near syncope EXAM: PORTABLE CHEST 1 VIEW COMPARISON:  07/19/2017 FINDINGS: Prominent heart size accentuated by low volumes. Negative mediastinal contours for technique. There is no edema, consolidation, effusion, or pneumothorax. Artifact from EKG leads. IMPRESSION: Low volume chest without acute finding. Electronically Signed   By: Marnee Spring M.D.   On: 05/06/2018 18:15    Scheduled Meds: . aspirin  81 mg Oral Daily  . enoxaparin (LOVENOX) injection  40 mg Subcutaneous Q24H  . insulin aspart  0-15 Units Subcutaneous TID WC  . insulin aspart  4 Units Subcutaneous TID WC  . insulin detemir  30 Units Subcutaneous QHS  . lidocaine      . sodium chloride flush  3 mL Intravenous Q12H   Continuous Infusions:  Principal Problem:   Stroke [facility]) Active Problems:   CKD (chronic kidney disease) stage 3, GFR 30-59 ml/min (HCC)  Diabetes (HCC)   Stage 3 chronic kidney disease (HCC)   Type 2 diabetes mellitus with peripheral neuropathy (HCC)   Symptomatic bradycardia   Postural dizziness with near syncope   Amputated below knee, left (HCC)    Time spent: 40 minutes    [facility] M  [facility]  If 7PM-7AM, please contact night-coverage at [email or web address], password [facility] 05/07/2018, 3:43 PM  LOS: 0 days

## 2018-05-07 NOTE — Procedures (Signed)
S/P 4 vessel cerebral arteriogram. RT CFA approach. Findings. 1.Occluded RT VA at C1 2.Approx 60 % stenosis dominant Lt VA at origin 3.Approx 60 % stenosis origin of RT VA

## 2018-05-07 NOTE — Progress Notes (Signed)
Progress Note  Patient Name: Kevin Bradford Date of Encounter: 05/07/2018  Primary Cardiologist:   Subjective   Less vertiginous no cardiac complaints   Inpatient Medications    Scheduled Meds: . aspirin  81 mg Oral Daily  . enoxaparin (LOVENOX) injection  40 mg Subcutaneous Q24H  . insulin aspart  0-15 Units Subcutaneous TID WC  . insulin aspart  4 Units Subcutaneous TID WC  . insulin detemir  30 Units Subcutaneous QHS  . sodium chloride flush  3 mL Intravenous Q12H   Continuous Infusions:  PRN Meds: alum & mag hydroxide-simeth   Vital Signs    Vitals:   05/06/18 2145 05/06/18 2200 05/06/18 2252 05/07/18 0716  BP: (!) 145/91 (!) 151/84 (!) 155/96 (!) 157/104  Pulse: 74 (!) 54 (!) 59 64  Resp: 16 11 16 16   Temp:   (!) 97.5 F (36.4 C) 97.9 F (36.6 C)  TempSrc:   Oral Oral  SpO2: 99% 100% 99% 97%  Weight:   100.2 kg   Height:   6\' 2"  (1.88 m)     Intake/Output Summary (Last 24 hours) at 05/07/2018 0856 Last data filed at 05/06/2018 2217 Gross per 24 hour  Intake 2000.02 ml  Output -  Net 2000.02 ml   Filed Weights   05/06/18 1711 05/06/18 2252  Weight: 99.8 kg 100.2 kg    Telemetry    NSR 05/07/2018  - Personally Reviewed  ECG    NSR no acute changes rate 50   - Personally Reviewed  Physical Exam  Comfortable  GEN: No acute distress.   Neck: No JVD Cardiac: RRR, no murmurs, rubs, or gallops.  Respiratory: Clear to auscultation bilaterally. GI: Soft, nontender, non-distended  MS: No edema; No deformity. Neuro:  Nonfocal  Psych: Normal affect  Post left BKA   Labs    Chemistry Recent Labs  Lab 05/06/18 1720  NA 137  K 4.2  CL 109  CO2 22  GLUCOSE 211*  BUN 24*  CREATININE 1.31*  CALCIUM 9.0  PROT 6.3*  ALBUMIN 3.9  AST 19  ALT 15  ALKPHOS 80  BILITOT 0.9  GFRNONAA 53*  GFRAA >60  ANIONGAP 6     Hematology Recent Labs  Lab 05/06/18 1720  WBC 8.0  RBC 4.24  HGB 12.1*  HCT 37.6*  MCV 88.7  MCH 28.5  MCHC 32.2    RDW 14.0  PLT 167    Cardiac Enzymes Recent Labs  Lab 05/06/18 2319 05/07/18 0423  TROPONINI <0.03 <0.03    Recent Labs  Lab 05/06/18 1749 05/06/18 2147  TROPIPOC 0.00 0.00     BNPNo results for input(s): BNP, PROBNP in the last 168 hours.   DDimer No results for input(s): DDIMER in the last 168 hours.   Radiology    Mr Maxine Glenn Head Wo Contrast  Result Date: 05/07/2018 CLINICAL DATA:  Transient ischemic attack.  Near syncope. EXAM: MRI HEAD WITHOUT CONTRAST MRA HEAD WITHOUT CONTRAST MRA NECK WITHOUT CONTRAST TECHNIQUE: Multiplanar, multiecho pulse sequences of the brain and surrounding structures were obtained without intravenous contrast. Angiographic images of the Circle of Willis were obtained using MRA technique without intravenous contrast. Angiographic images of the neck were obtained using MRA technique without intravenous contrast. Carotid stenosis measurements (when applicable) are obtained utilizing NASCET criteria, using the distal internal carotid diameter as the denominator. COMPARISON:  None. FINDINGS: MRI HEAD FINDINGS BRAIN: The midline structures are normal. Multifocal abnormal diffusion restriction within the right cerebellar hemisphere and in the right  half of the medulla oblongata. No supratentorial diffusion restriction. Mild periventricular white matter hyperintensity. The CSF spaces are normal for age, with no hydrocephalus. Blood-sensitive sequences show no chronic microhemorrhage or superficial siderosis. There is magnetic susceptibility effect within the V4 segment of the right vertebral artery, which also has an abnormal flow void. SKULL AND UPPER CERVICAL SPINE: The visualized skull base, calvarium, upper cervical spine and extracranial soft tissues are normal. SINUSES/ORBITS: No fluid levels or advanced mucosal thickening. No mastoid or middle ear effusion. The orbits are normal. MRA HEAD FINDINGS Intracranial internal carotid arteries: Normal. Anterior cerebral  arteries: Normal. Middle cerebral arteries: Normal. Posterior communicating arteries: Present on the left. Posterior cerebral arteries: Normal. Basilar artery: Normal. Superior cerebellar arteries: Normal. Anterior inferior cerebellar arteries: Normal. Posterior inferior cerebellar arteries: Normal on the left. Loss of normal flow related enhancement distally on the right. The. MRA NECK FINDINGS Aortic arch: Normal 3 vessel aortic branching pattern. The visualized subclavian arteries are normal. Right carotid system: Normal course and caliber without stenosis or evidence of dissection. Left carotid system: Normal course and caliber without stenosis or evidence of dissection. Vertebral arteries: Left dominant. Vertebral artery origins are not visualized. There is loss of normal flow related enhancement of the right vertebral artery distal V3 segment and V4 segment. IMPRESSION: 1. Multiple small ischemic infarcts, including the right medulla oblongata and multiple foci within the right cerebellar hemisphere. No hemorrhage or mass effect. 2. Loss of flow related enhancement of the distal V3 segment and V4 segment of the right vertebral artery, consistent with occlusion. Magnetic susceptibility effect within the V4 segment on the SWI sequence suggests the presence of thrombus. 3. Otherwise normal MRA of the head and neck. 4. Findings of mild chronic microvascular ischemia. These results were communicated to Dr. Lyda Perone at 6:55 am on 05/07/2018 by text page via the [facility] messaging system. Electronically Signed   By: Deatra Robinson M.D.   On: 05/07/2018 06:56   Mr Maxine Glenn Neck Wo Contrast  Result Date: 05/07/2018 CLINICAL DATA:  Transient ischemic attack.  Near syncope. EXAM: MRI HEAD WITHOUT CONTRAST MRA HEAD WITHOUT CONTRAST MRA NECK WITHOUT CONTRAST TECHNIQUE: Multiplanar, multiecho pulse sequences of the brain and surrounding structures were obtained without intravenous contrast. Angiographic images of the  Circle of Willis were obtained using MRA technique without intravenous contrast. Angiographic images of the neck were obtained using MRA technique without intravenous contrast. Carotid stenosis measurements (when applicable) are obtained utilizing NASCET criteria, using the distal internal carotid diameter as the denominator. COMPARISON:  None. FINDINGS: MRI HEAD FINDINGS BRAIN: The midline structures are normal. Multifocal abnormal diffusion restriction within the right cerebellar hemisphere and in the right half of the medulla oblongata. No supratentorial diffusion restriction. Mild periventricular white matter hyperintensity. The CSF spaces are normal for age, with no hydrocephalus. Blood-sensitive sequences show no chronic microhemorrhage or superficial siderosis. There is magnetic susceptibility effect within the V4 segment of the right vertebral artery, which also has an abnormal flow void. SKULL AND UPPER CERVICAL SPINE: The visualized skull base, calvarium, upper cervical spine and extracranial soft tissues are normal. SINUSES/ORBITS: No fluid levels or advanced mucosal thickening. No mastoid or middle ear effusion. The orbits are normal. MRA HEAD FINDINGS Intracranial internal carotid arteries: Normal. Anterior cerebral arteries: Normal. Middle cerebral arteries: Normal. Posterior communicating arteries: Present on the left. Posterior cerebral arteries: Normal. Basilar artery: Normal. Superior cerebellar arteries: Normal. Anterior inferior cerebellar arteries: Normal. Posterior inferior cerebellar arteries: Normal on the left. Loss of normal flow  related enhancement distally on the right. The. MRA NECK FINDINGS Aortic arch: Normal 3 vessel aortic branching pattern. The visualized subclavian arteries are normal. Right carotid system: Normal course and caliber without stenosis or evidence of dissection. Left carotid system: Normal course and caliber without stenosis or evidence of dissection. Vertebral  arteries: Left dominant. Vertebral artery origins are not visualized. There is loss of normal flow related enhancement of the right vertebral artery distal V3 segment and V4 segment. IMPRESSION: 1. Multiple small ischemic infarcts, including the right medulla oblongata and multiple foci within the right cerebellar hemisphere. No hemorrhage or mass effect. 2. Loss of flow related enhancement of the distal V3 segment and V4 segment of the right vertebral artery, consistent with occlusion. Magnetic susceptibility effect within the V4 segment on the SWI sequence suggests the presence of thrombus. 3. Otherwise normal MRA of the head and neck. 4. Findings of mild chronic microvascular ischemia. These results were communicated to Dr. Lyda Perone at 6:55 am on 05/07/2018 by text page via the [facility] messaging system. Electronically Signed   By: Deatra Robinson M.D.   On: 05/07/2018 06:56   Mr Brain Wo Contrast  Result Date: 05/07/2018 CLINICAL DATA:  Transient ischemic attack.  Near syncope. EXAM: MRI HEAD WITHOUT CONTRAST MRA HEAD WITHOUT CONTRAST MRA NECK WITHOUT CONTRAST TECHNIQUE: Multiplanar, multiecho pulse sequences of the brain and surrounding structures were obtained without intravenous contrast. Angiographic images of the Circle of Willis were obtained using MRA technique without intravenous contrast. Angiographic images of the neck were obtained using MRA technique without intravenous contrast. Carotid stenosis measurements (when applicable) are obtained utilizing NASCET criteria, using the distal internal carotid diameter as the denominator. COMPARISON:  None. FINDINGS: MRI HEAD FINDINGS BRAIN: The midline structures are normal. Multifocal abnormal diffusion restriction within the right cerebellar hemisphere and in the right half of the medulla oblongata. No supratentorial diffusion restriction. Mild periventricular white matter hyperintensity. The CSF spaces are normal for age, with no hydrocephalus.  Blood-sensitive sequences show no chronic microhemorrhage or superficial siderosis. There is magnetic susceptibility effect within the V4 segment of the right vertebral artery, which also has an abnormal flow void. SKULL AND UPPER CERVICAL SPINE: The visualized skull base, calvarium, upper cervical spine and extracranial soft tissues are normal. SINUSES/ORBITS: No fluid levels or advanced mucosal thickening. No mastoid or middle ear effusion. The orbits are normal. MRA HEAD FINDINGS Intracranial internal carotid arteries: Normal. Anterior cerebral arteries: Normal. Middle cerebral arteries: Normal. Posterior communicating arteries: Present on the left. Posterior cerebral arteries: Normal. Basilar artery: Normal. Superior cerebellar arteries: Normal. Anterior inferior cerebellar arteries: Normal. Posterior inferior cerebellar arteries: Normal on the left. Loss of normal flow related enhancement distally on the right. The. MRA NECK FINDINGS Aortic arch: Normal 3 vessel aortic branching pattern. The visualized subclavian arteries are normal. Right carotid system: Normal course and caliber without stenosis or evidence of dissection. Left carotid system: Normal course and caliber without stenosis or evidence of dissection. Vertebral arteries: Left dominant. Vertebral artery origins are not visualized. There is loss of normal flow related enhancement of the right vertebral artery distal V3 segment and V4 segment. IMPRESSION: 1. Multiple small ischemic infarcts, including the right medulla oblongata and multiple foci within the right cerebellar hemisphere. No hemorrhage or mass effect. 2. Loss of flow related enhancement of the distal V3 segment and V4 segment of the right vertebral artery, consistent with occlusion. Magnetic susceptibility effect within the V4 segment on the SWI sequence suggests the presence of thrombus. 3. Otherwise  normal MRA of the head and neck. 4. Findings of mild chronic microvascular ischemia.  These results were communicated to Dr. Lyda Perone at 6:55 am on 05/07/2018 by text page via the [facility] messaging system. Electronically Signed   By: Deatra Robinson M.D.   On: 05/07/2018 06:56   Dg Chest Port 1 View  Result Date: 05/06/2018 CLINICAL DATA:  Weakness for couple of hours.  Near syncope EXAM: PORTABLE CHEST 1 VIEW COMPARISON:  07/19/2017 FINDINGS: Prominent heart size accentuated by low volumes. Negative mediastinal contours for technique. There is no edema, consolidation, effusion, or pneumothorax. Artifact from EKG leads. IMPRESSION: Low volume chest without acute finding. Electronically Signed   By: Marnee Spring M.D.   On: 05/06/2018 18:15    Cardiac Studies   TTE pending   Patient Profile     72 y.o. male  With DM , Left BKA CRF admitted with pre syncope nausea vertigo and bradycardia In setting of posterior circulation stroke  Assessment & Plan    Bradycardia:  Resolved related to CNS event and vagal inputs no AV block no indication for pacing CVA:  Having diagnostic cerebral angiogram this am. TTE pending  May need TEE , ILR pending neuro evaluation DM:  Discussed low carb diet.  Target hemoglobin A1c is 6.5 or less.  Continue current medications.      For questions or updates, please contact [facility] Please consult [email or web address] for contact info under        Signed, Charlton Haws, MD  05/07/2018, 8:56 AM

## 2018-05-08 DIAGNOSIS — Z794 Long term (current) use of insulin: Secondary | ICD-10-CM

## 2018-05-08 DIAGNOSIS — E119 Type 2 diabetes mellitus without complications: Secondary | ICD-10-CM

## 2018-05-08 DIAGNOSIS — I63211 Cerebral infarction due to unspecified occlusion or stenosis of right vertebral arteries: Secondary | ICD-10-CM

## 2018-05-08 DIAGNOSIS — S88112A Complete traumatic amputation at level between knee and ankle, left lower leg, initial encounter: Secondary | ICD-10-CM

## 2018-05-08 DIAGNOSIS — I6300 Cerebral infarction due to thrombosis of unspecified precerebral artery: Secondary | ICD-10-CM

## 2018-05-08 LAB — BASIC METABOLIC PANEL
Anion gap: 8 (ref 5–15)
BUN: 13 mg/dL (ref 8–23)
CALCIUM: 8.9 mg/dL (ref 8.9–10.3)
CO2: 23 mmol/L (ref 22–32)
Chloride: 107 mmol/L (ref 98–111)
Creatinine, Ser: 1.19 mg/dL (ref 0.61–1.24)
GFR calc Af Amer: >60 mL/min (ref >60)
GFR, EST NON AFRICAN AMERICAN: 59 mL/min — AB (ref >60)
GLUCOSE: 196 mg/dL — AB (ref 70–99)
Potassium: 3.9 mmol/L (ref 3.5–5.1)
Sodium: 138 mmol/L (ref 135–145)

## 2018-05-08 LAB — LIPID PANEL
CHOLESTEROL: 215 mg/dL — AB (ref 0–200)
HDL: 39 mg/dL — AB (ref >40)
LDL Cholesterol: 130 mg/dL — ABNORMAL HIGH (ref 0–99)
Total CHOL/HDL Ratio: 5.5 RATIO
Triglycerides: 228 mg/dL — ABNORMAL HIGH (ref <150)
VLDL: 46 mg/dL — ABNORMAL HIGH (ref 0–40)

## 2018-05-08 LAB — GLUCOSE, CAPILLARY
GLUCOSE-CAPILLARY: 178 mg/dL — AB (ref 70–99)
Glucose-Capillary: 121 mg/dL — ABNORMAL HIGH (ref 70–99)
Glucose-Capillary: 138 mg/dL — ABNORMAL HIGH (ref 70–99)

## 2018-05-08 MED ORDER — INFLUENZA VAC SPLIT HIGH-DOSE 0.5 ML IM SUSY
0.5000 mL | PREFILLED_SYRINGE | Freq: Once | INTRAMUSCULAR | Status: AC
  Administered 2018-05-08: 0.5 mL via INTRAMUSCULAR
  Filled 2018-05-08: qty 0.5

## 2018-05-08 MED ORDER — ATORVASTATIN CALCIUM 40 MG PO TABS: 40.0000 mg | ORAL_TABLET | Freq: Every day | ORAL | 0 refills | Status: DC

## 2018-05-08 MED ORDER — ASPIRIN 325 MG PO TBEC: 325.0000 mg | DELAYED_RELEASE_TABLET | Freq: Every day | ORAL | 0 refills | Status: AC

## 2018-05-08 MED ORDER — ASPIRIN EC 325 MG PO TBEC
325.0000 mg | DELAYED_RELEASE_TABLET | Freq: Every day | ORAL | Status: DC
  Administered 2018-05-08: 325 mg via ORAL
  Filled 2018-05-08: qty 1

## 2018-05-08 MED ORDER — CLOPIDOGREL BISULFATE 75 MG PO TABS: 75.0000 mg | ORAL_TABLET | Freq: Every day | ORAL | 6 refills | Status: DC

## 2018-05-08 MED ORDER — ATORVASTATIN CALCIUM 40 MG PO TABS: 40.0000 mg | ORAL_TABLET | Freq: Every day | ORAL | Status: DC

## 2018-05-08 NOTE — Plan of Care (Signed)
  Problem: Activity: Goal: Risk for activity intolerance will decrease Outcome: Progressing   Problem: Elimination: Goal: Will not experience complications related to bowel motility Outcome: Progressing   Problem: Safety: Goal: Ability to remain free from injury will improve Outcome: Progressing   

## 2018-05-08 NOTE — Progress Notes (Addendum)
STROKE TEAM PROGRESS NOTE   INTERVAL HISTORY His wife and daughter are at the bedside.  Feels symptoms from yesterday have mainly resolved. He is already attending therapy for his L BKA. Would like PT input from stroke perspective prior to d/c.  Vitals:   05/07/18 1948 05/08/18 0043 05/08/18 0050 05/08/18 0605  BP: (!) 152/79 (!) 154/85  119/89  Pulse: 72 75  88  Resp: 18 18  18   Temp: 98.2 F (36.8 C) 98.4 F (36.9 C)  98.9 F (37.2 C)  TempSrc: Oral Oral  Oral  SpO2: 100% 97%  100%  Weight:   97.6 kg   Height:        CBC:  Recent Labs  Lab 05/06/18 1720  WBC 8.0  NEUTROABS 6.2  HGB 12.1*  HCT 37.6*  MCV 88.7  PLT 167    Basic Metabolic Panel:  Recent Labs  Lab 05/06/18 1720 05/08/18 0256  NA 137 138  K 4.2 3.9  CL 109 107  CO2 22 23  GLUCOSE 211* 196*  BUN 24* 13  CREATININE 1.31* 1.19  CALCIUM 9.0 8.9   Lipid Panel:     Component Value Date/Time   CHOL 215 (H) 05/08/2018 0256   TRIG 228 (H) 05/08/2018 0256   HDL 39 (L) 05/08/2018 0256   CHOLHDL 5.5 05/08/2018 0256   VLDL 46 (H) 05/08/2018 0256   LDLCALC 130 (H) 05/08/2018 0256   HgbA1c:  Lab Results  Component Value Date   HGBA1C 6.7 (H) 11/22/2017   Urine Drug Screen: No results found for: LABOPIA, COCAINSCRNUR, LABBENZ, AMPHETMU, THCU, LABBARB  Alcohol Level     Component Value Date/Time   [facility] <10 05/06/2018 1720    IMAGING Mr Maxine Glenn Head Wo Contrast  Result Date: 05/07/2018 CLINICAL DATA:  Transient ischemic attack.  Near syncope. EXAM: MRI HEAD WITHOUT CONTRAST MRA HEAD WITHOUT CONTRAST MRA NECK WITHOUT CONTRAST TECHNIQUE: Multiplanar, multiecho pulse sequences of the brain and surrounding structures were obtained without intravenous contrast. Angiographic images of the Circle of Willis were obtained using MRA technique without intravenous contrast. Angiographic images of the neck were obtained using MRA technique without intravenous contrast. Carotid stenosis measurements (when applicable)  are obtained utilizing NASCET criteria, using the distal internal carotid diameter as the denominator. COMPARISON:  None. FINDINGS: MRI HEAD FINDINGS BRAIN: The midline structures are normal. Multifocal abnormal diffusion restriction within the right cerebellar hemisphere and in the right half of the medulla oblongata. No supratentorial diffusion restriction. Mild periventricular white matter hyperintensity. The CSF spaces are normal for age, with no hydrocephalus. Blood-sensitive sequences show no chronic microhemorrhage or superficial siderosis. There is magnetic susceptibility effect within the V4 segment of the right vertebral artery, which also has an abnormal flow void. SKULL AND UPPER CERVICAL SPINE: The visualized skull base, calvarium, upper cervical spine and extracranial soft tissues are normal. SINUSES/ORBITS: No fluid levels or advanced mucosal thickening. No mastoid or middle ear effusion. The orbits are normal. MRA HEAD FINDINGS Intracranial internal carotid arteries: Normal. Anterior cerebral arteries: Normal. Middle cerebral arteries: Normal. Posterior communicating arteries: Present on the left. Posterior cerebral arteries: Normal. Basilar artery: Normal. Superior cerebellar arteries: Normal. Anterior inferior cerebellar arteries: Normal. Posterior inferior cerebellar arteries: Normal on the left. Loss of normal flow related enhancement distally on the right. The. MRA NECK FINDINGS Aortic arch: Normal 3 vessel aortic branching pattern. The visualized subclavian arteries are normal. Right carotid system: Normal course and caliber without stenosis or evidence of dissection. Left carotid system: Normal course and  caliber without stenosis or evidence of dissection. Vertebral arteries: Left dominant. Vertebral artery origins are not visualized. There is loss of normal flow related enhancement of the right vertebral artery distal V3 segment and V4 segment. IMPRESSION: 1. Multiple small ischemic infarcts,  including the right medulla oblongata and multiple foci within the right cerebellar hemisphere. No hemorrhage or mass effect. 2. Loss of flow related enhancement of the distal V3 segment and V4 segment of the right vertebral artery, consistent with occlusion. Magnetic susceptibility effect within the V4 segment on the SWI sequence suggests the presence of thrombus. 3. Otherwise normal MRA of the head and neck. 4. Findings of mild chronic microvascular ischemia. These results were communicated to Dr. Lyda Perone at 6:55 am on 05/07/2018 by text page via the [facility] messaging system. Electronically Signed   By: Deatra Robinson M.D.   On: 05/07/2018 06:56   Mr Maxine Glenn Neck Wo Contrast  Result Date: 05/07/2018 CLINICAL DATA:  Transient ischemic attack.  Near syncope. EXAM: MRI HEAD WITHOUT CONTRAST MRA HEAD WITHOUT CONTRAST MRA NECK WITHOUT CONTRAST TECHNIQUE: Multiplanar, multiecho pulse sequences of the brain and surrounding structures were obtained without intravenous contrast. Angiographic images of the Circle of Willis were obtained using MRA technique without intravenous contrast. Angiographic images of the neck were obtained using MRA technique without intravenous contrast. Carotid stenosis measurements (when applicable) are obtained utilizing NASCET criteria, using the distal internal carotid diameter as the denominator. COMPARISON:  None. FINDINGS: MRI HEAD FINDINGS BRAIN: The midline structures are normal. Multifocal abnormal diffusion restriction within the right cerebellar hemisphere and in the right half of the medulla oblongata. No supratentorial diffusion restriction. Mild periventricular white matter hyperintensity. The CSF spaces are normal for age, with no hydrocephalus. Blood-sensitive sequences show no chronic microhemorrhage or superficial siderosis. There is magnetic susceptibility effect within the V4 segment of the right vertebral artery, which also has an abnormal flow void. SKULL AND UPPER  CERVICAL SPINE: The visualized skull base, calvarium, upper cervical spine and extracranial soft tissues are normal. SINUSES/ORBITS: No fluid levels or advanced mucosal thickening. No mastoid or middle ear effusion. The orbits are normal. MRA HEAD FINDINGS Intracranial internal carotid arteries: Normal. Anterior cerebral arteries: Normal. Middle cerebral arteries: Normal. Posterior communicating arteries: Present on the left. Posterior cerebral arteries: Normal. Basilar artery: Normal. Superior cerebellar arteries: Normal. Anterior inferior cerebellar arteries: Normal. Posterior inferior cerebellar arteries: Normal on the left. Loss of normal flow related enhancement distally on the right. The. MRA NECK FINDINGS Aortic arch: Normal 3 vessel aortic branching pattern. The visualized subclavian arteries are normal. Right carotid system: Normal course and caliber without stenosis or evidence of dissection. Left carotid system: Normal course and caliber without stenosis or evidence of dissection. Vertebral arteries: Left dominant. Vertebral artery origins are not visualized. There is loss of normal flow related enhancement of the right vertebral artery distal V3 segment and V4 segment. IMPRESSION: 1. Multiple small ischemic infarcts, including the right medulla oblongata and multiple foci within the right cerebellar hemisphere. No hemorrhage or mass effect. 2. Loss of flow related enhancement of the distal V3 segment and V4 segment of the right vertebral artery, consistent with occlusion. Magnetic susceptibility effect within the V4 segment on the SWI sequence suggests the presence of thrombus. 3. Otherwise normal MRA of the head and neck. 4. Findings of mild chronic microvascular ischemia. These results were communicated to Dr. Lyda Perone at 6:55 am on 05/07/2018 by text page via the [facility] messaging system. Electronically Signed   By: Caryn Bee  Chase Picket M.D.   On: 05/07/2018 06:56   Mr Brain Wo Contrast  Result Date:  05/07/2018 CLINICAL DATA:  Transient ischemic attack.  Near syncope. EXAM: MRI HEAD WITHOUT CONTRAST MRA HEAD WITHOUT CONTRAST MRA NECK WITHOUT CONTRAST TECHNIQUE: Multiplanar, multiecho pulse sequences of the brain and surrounding structures were obtained without intravenous contrast. Angiographic images of the Circle of Willis were obtained using MRA technique without intravenous contrast. Angiographic images of the neck were obtained using MRA technique without intravenous contrast. Carotid stenosis measurements (when applicable) are obtained utilizing NASCET criteria, using the distal internal carotid diameter as the denominator. COMPARISON:  None. FINDINGS: MRI HEAD FINDINGS BRAIN: The midline structures are normal. Multifocal abnormal diffusion restriction within the right cerebellar hemisphere and in the right half of the medulla oblongata. No supratentorial diffusion restriction. Mild periventricular white matter hyperintensity. The CSF spaces are normal for age, with no hydrocephalus. Blood-sensitive sequences show no chronic microhemorrhage or superficial siderosis. There is magnetic susceptibility effect within the V4 segment of the right vertebral artery, which also has an abnormal flow void. SKULL AND UPPER CERVICAL SPINE: The visualized skull base, calvarium, upper cervical spine and extracranial soft tissues are normal. SINUSES/ORBITS: No fluid levels or advanced mucosal thickening. No mastoid or middle ear effusion. The orbits are normal. MRA HEAD FINDINGS Intracranial internal carotid arteries: Normal. Anterior cerebral arteries: Normal. Middle cerebral arteries: Normal. Posterior communicating arteries: Present on the left. Posterior cerebral arteries: Normal. Basilar artery: Normal. Superior cerebellar arteries: Normal. Anterior inferior cerebellar arteries: Normal. Posterior inferior cerebellar arteries: Normal on the left. Loss of normal flow related enhancement distally on the right. The. MRA  NECK FINDINGS Aortic arch: Normal 3 vessel aortic branching pattern. The visualized subclavian arteries are normal. Right carotid system: Normal course and caliber without stenosis or evidence of dissection. Left carotid system: Normal course and caliber without stenosis or evidence of dissection. Vertebral arteries: Left dominant. Vertebral artery origins are not visualized. There is loss of normal flow related enhancement of the right vertebral artery distal V3 segment and V4 segment. IMPRESSION: 1. Multiple small ischemic infarcts, including the right medulla oblongata and multiple foci within the right cerebellar hemisphere. No hemorrhage or mass effect. 2. Loss of flow related enhancement of the distal V3 segment and V4 segment of the right vertebral artery, consistent with occlusion. Magnetic susceptibility effect within the V4 segment on the SWI sequence suggests the presence of thrombus. 3. Otherwise normal MRA of the head and neck. 4. Findings of mild chronic microvascular ischemia. These results were communicated to Dr. Lyda Perone at 6:55 am on 05/07/2018 by text page via the [facility] messaging system. Electronically Signed   By: Deatra Robinson M.D.   On: 05/07/2018 06:56   Dg Chest Port 1 View  Result Date: 05/06/2018 CLINICAL DATA:  Weakness for couple of hours.  Near syncope EXAM: PORTABLE CHEST 1 VIEW COMPARISON:  07/19/2017 FINDINGS: Prominent heart size accentuated by low volumes. Negative mediastinal contours for technique. There is no edema, consolidation, effusion, or pneumothorax. Artifact from EKG leads. IMPRESSION: Low volume chest without acute finding. Electronically Signed   By: Marnee Spring M.D.   On: 05/06/2018 18:15   S/P 4 vessel cerebral arteriogram. RT CFA approach. Findings. 1.Occluded RT VA at C1 2.Approx 60 % stenosis dominant Lt VA at origin 3.Approx 60 % stenosis origin of RT VA  2D Echocardiogram  - Left ventricle: The cavity size was normal. Wall thickness was  increased in a pattern of moderate LVH. Systolic function was normal.  The estimated ejection fraction was in the range of 60% to 65%. Wall motion was normal; there were no regional wall motion abnormalities. Doppler parameters are consistent with abnormal left ventricular relaxation (grade 1 diastolic dysfunction). The E/e&' ratio is between 8-15, suggesting indeterminate LV filling pressure. - Left atrium: The atrium was mildly dilated. - Systemic veins: The IVC measures <2.1 cm, but does not collapse >50%, suggesting an elevated RA pressure of 8 mmHg. Impressions:   LVEF 60-65%, moderate LVH, normal wall motion, grade 1 DD, indeterminate LV filling pressure, mild LAE, IVC suggests elevated RA pressure of 8 mmHg.   PHYSICAL EXAM Constitutional: Appears well-developed and well-nourished.  Psych: Affect appropriate to situation Eyes: No scleral injection HENT: No OP obstrucion Head: Normocephalic.  Cardiovascular: Normal rate and regular rhythm.  Respiratory: Effort normal, non-labored breathing Skin: WDI  Neuro: Mental Status: Patient is awake, alert, oriented to person, place, month, year, and situation. Patient is able to give a clear and coherent history. No signs of aphasia or neglect Cranial Nerves: II: Visual Fields are full. Pupils are equal, round, and reactive to light.   III,IV, VI: EOMI without ptosis or diploplia.  V: Facial sensation is symmetric to temperature VII: Facial movement is symmetric.  VIII: hearing is intact to voice X: Uvula elevates symmetrically XI: Shoulder shrug is symmetric. XII: tongue is midline without atrophy or fasciculations.  Motor: Tone is normal. Bulk is normal. 5/5 strength was present in all four extremities.  Left below-knee amputation. R arm orbits his L arm. Sensory: Sensation is symmetric to light touch and temperature in the arms and legs. Plantars: Toes are downgoing on the right Cerebellar: FNF   ASSESSMENT/PLAN Mr. Kevin Bradford is a 72 y.o. male with history of type II DB, DB neuropathy, L BKA, CKD presenting with acute onset gait instability, nausea and dizziness and possibly mild R facial droop and leaning towards the right.  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 No source of embolus   LDL 130  HgbA1c pending   Lovenox 40 mg sq daily for VTE prophylaxis  aspirin 81 mg daily prior to admission, now on aspirin 325 mg daily and clopidogrel 75 mg daily. Continue DAPT x 3 months then plavix alone  Therapy recommendations:  Pending. Added PT and OT evals. Already goes to OP therapy for L BKA  Disposition:  Return home Ok for d/c from stroke standpoint Follow-up Stroke Clinic at [facility] in 4 weeks. Office will call with appointment date and time. Order placed.  Hyperlipidemia  Home meds:  No statin  Now on lipitor 40  LDL 130, goal < 70  Continue statin at discharge  Diabetes type II  HgbA1c pending, goal < 7.0  Other Stroke Risk Factors  Advanced age  Overweight Body mass index is 27.63 kg/m., recommend weight loss, diet and exercise as appropriate   Other Active Problems  CKD stage III  Hospital day # 1  Annie Main, MSN, APRN, ANVP-BC, AGPCNP-BC Advanced Practice Stroke Nurse [facility] See Amion for Schedule & Pager information 05/08/2018 3:44 PM   To contact Stroke Continuity provider, please refer to [email or web address]. After hours, contact General Neurology  ATTENDING NOTE: I reviewed above note and agree with the assessment and plan. Pt was seen and examined.   72 year old male with history of CKD, diabetes, diabetic neuropathy and diabetic left foot ulcer admitted for imbalance, leaning  toward to the right side, gait difficulty, dizzy nausea and sweating with questionable right facial droop.  MRI showed right inferior cerebellum 3 punctate and right lateral medullary infarct, consistent with right PICA infarcts.  MRA showed right VA occlusion.  DSA showed right VA occlusion in the bilateral VA 60% origin stenosis.  EF 60 to 65%.  LDL at 130 and A1c pending.   Patient right PICA infarcts likely due to right VA occlusion.  Recommend aspirin 325 and Plavix 75 DAPT for 3 months and then Plavix alone given intracranial stenosis.  Continue Lipitor on discharge for stroke prevention.  Neurology will sign off. Please call with questions. Pt will follow up with stroke clinic NP at [facility] in about 4 weeks. Thanks for the consult.   Marvel Plan, MD PhD Stroke Neurology 05/08/2018 6:53 PM

## 2018-05-08 NOTE — Progress Notes (Signed)
Progress Note  Patient Name: Kevin Bradford Date of Encounter: 05/08/2018  Primary Cardiologist:    Subjective   No cardiac complaints   Inpatient Medications    Scheduled Meds: . aspirin  325 mg Oral Daily  . atorvastatin  40 mg Oral q1800  . clopidogrel  75 mg Oral Daily  . enoxaparin (LOVENOX) injection  40 mg Subcutaneous Q24H  . insulin aspart  0-15 Units Subcutaneous TID WC  . insulin aspart  4 Units Subcutaneous TID WC  . insulin detemir  30 Units Subcutaneous QHS  . sodium chloride flush  3 mL Intravenous Q12H   Continuous Infusions:  PRN Meds: alum & mag hydroxide-simeth   Vital Signs    Vitals:   05/07/18 1948 05/08/18 0043 05/08/18 0050 05/08/18 0605  BP: (!) 152/79 (!) 154/85  119/89  Pulse: 72 75  88  Resp: 18 18  18   Temp: 98.2 F (36.8 C) 98.4 F (36.9 C)  98.9 F (37.2 C)  TempSrc: Oral Oral  Oral  SpO2: 100% 97%  100%  Weight:   97.6 kg   Height:        Intake/Output Summary (Last 24 hours) at 05/08/2018 0853 Last data filed at 05/08/2018 1610 Gross per 24 hour  Intake 745.64 ml  Output 1175 ml  Net -429.36 ml   Filed Weights   05/06/18 1711 05/06/18 2252 05/08/18 0050  Weight: 99.8 kg 100.2 kg 97.6 kg    Telemetry    NSR  - Personally Reviewed  ECG    SR no acute changes  - Personally Reviewed  Physical Exam  Post Left BKA  GEN: No acute distress.   Neck: No JVD Cardiac: RRR, no murmurs, rubs, or gallops.  Respiratory: Clear to auscultation bilaterally. GI: Soft, nontender, non-distended  MS: No edema; No deformity. Neuro:  Nonfocal  Psych: Normal affect   Labs    Chemistry Recent Labs  Lab 05/06/18 1720 05/08/18 0256  NA 137 138  K 4.2 3.9  CL 109 107  CO2 22 23  GLUCOSE 211* 196*  BUN 24* 13  CREATININE 1.31* 1.19  CALCIUM 9.0 8.9  PROT 6.3*  --   ALBUMIN 3.9  --   AST 19  --   ALT 15  --   ALKPHOS 80  --   BILITOT 0.9  --   GFRNONAA 53* 59*  GFRAA >60 >60  ANIONGAP 6 8     Hematology Recent  Labs  Lab 05/06/18 1720  WBC 8.0  RBC 4.24  HGB 12.1*  HCT 37.6*  MCV 88.7  MCH 28.5  MCHC 32.2  RDW 14.0  PLT 167    Cardiac Enzymes Recent Labs  Lab 05/06/18 2319 05/07/18 0423 05/07/18 1025 05/07/18 1649  TROPONINI <0.03 <0.03 0.17* 0.44*    Recent Labs  Lab 05/06/18 1749 05/06/18 2147  TROPIPOC 0.00 0.00       Radiology    Mr Maxine Glenn Head Wo Contrast  Result Date: 05/07/2018 CLINICAL DATA:  Transient ischemic attack.  Near syncope. EXAM: MRI HEAD WITHOUT CONTRAST MRA HEAD WITHOUT CONTRAST MRA NECK WITHOUT CONTRAST TECHNIQUE: Multiplanar, multiecho pulse sequences of the brain and surrounding structures were obtained without intravenous contrast. Angiographic images of the Circle of Willis were obtained using MRA technique without intravenous contrast. Angiographic images of the neck were obtained using MRA technique without intravenous contrast. Carotid stenosis measurements (when applicable) are obtained utilizing NASCET criteria, using the distal internal carotid diameter as the denominator. COMPARISON:  None. FINDINGS: MRI  HEAD FINDINGS BRAIN: The midline structures are normal. Multifocal abnormal diffusion restriction within the right cerebellar hemisphere and in the right half of the medulla oblongata. No supratentorial diffusion restriction. Mild periventricular white matter hyperintensity. The CSF spaces are normal for age, with no hydrocephalus. Blood-sensitive sequences show no chronic microhemorrhage or superficial siderosis. There is magnetic susceptibility effect within the V4 segment of the right vertebral artery, which also has an abnormal flow void. SKULL AND UPPER CERVICAL SPINE: The visualized skull base, calvarium, upper cervical spine and extracranial soft tissues are normal. SINUSES/ORBITS: No fluid levels or advanced mucosal thickening. No mastoid or middle ear effusion. The orbits are normal. MRA HEAD FINDINGS Intracranial internal carotid arteries: Normal.  Anterior cerebral arteries: Normal. Middle cerebral arteries: Normal. Posterior communicating arteries: Present on the left. Posterior cerebral arteries: Normal. Basilar artery: Normal. Superior cerebellar arteries: Normal. Anterior inferior cerebellar arteries: Normal. Posterior inferior cerebellar arteries: Normal on the left. Loss of normal flow related enhancement distally on the right. The. MRA NECK FINDINGS Aortic arch: Normal 3 vessel aortic branching pattern. The visualized subclavian arteries are normal. Right carotid system: Normal course and caliber without stenosis or evidence of dissection. Left carotid system: Normal course and caliber without stenosis or evidence of dissection. Vertebral arteries: Left dominant. Vertebral artery origins are not visualized. There is loss of normal flow related enhancement of the right vertebral artery distal V3 segment and V4 segment. IMPRESSION: 1. Multiple small ischemic infarcts, including the right medulla oblongata and multiple foci within the right cerebellar hemisphere. No hemorrhage or mass effect. 2. Loss of flow related enhancement of the distal V3 segment and V4 segment of the right vertebral artery, consistent with occlusion. Magnetic susceptibility effect within the V4 segment on the SWI sequence suggests the presence of thrombus. 3. Otherwise normal MRA of the head and neck. 4. Findings of mild chronic microvascular ischemia. These results were communicated to Dr. Lyda Perone at 6:55 am on 05/07/2018 by text page via the [facility] messaging system. Electronically Signed   By: Deatra Robinson M.D.   On: 05/07/2018 06:56   Mr Maxine Glenn Neck Wo Contrast  Result Date: 05/07/2018 CLINICAL DATA:  Transient ischemic attack.  Near syncope. EXAM: MRI HEAD WITHOUT CONTRAST MRA HEAD WITHOUT CONTRAST MRA NECK WITHOUT CONTRAST TECHNIQUE: Multiplanar, multiecho pulse sequences of the brain and surrounding structures were obtained without intravenous contrast. Angiographic  images of the Circle of Willis were obtained using MRA technique without intravenous contrast. Angiographic images of the neck were obtained using MRA technique without intravenous contrast. Carotid stenosis measurements (when applicable) are obtained utilizing NASCET criteria, using the distal internal carotid diameter as the denominator. COMPARISON:  None. FINDINGS: MRI HEAD FINDINGS BRAIN: The midline structures are normal. Multifocal abnormal diffusion restriction within the right cerebellar hemisphere and in the right half of the medulla oblongata. No supratentorial diffusion restriction. Mild periventricular white matter hyperintensity. The CSF spaces are normal for age, with no hydrocephalus. Blood-sensitive sequences show no chronic microhemorrhage or superficial siderosis. There is magnetic susceptibility effect within the V4 segment of the right vertebral artery, which also has an abnormal flow void. SKULL AND UPPER CERVICAL SPINE: The visualized skull base, calvarium, upper cervical spine and extracranial soft tissues are normal. SINUSES/ORBITS: No fluid levels or advanced mucosal thickening. No mastoid or middle ear effusion. The orbits are normal. MRA HEAD FINDINGS Intracranial internal carotid arteries: Normal. Anterior cerebral arteries: Normal. Middle cerebral arteries: Normal. Posterior communicating arteries: Present on the left. Posterior cerebral arteries: Normal. Basilar artery: Normal.  Superior cerebellar arteries: Normal. Anterior inferior cerebellar arteries: Normal. Posterior inferior cerebellar arteries: Normal on the left. Loss of normal flow related enhancement distally on the right. The. MRA NECK FINDINGS Aortic arch: Normal 3 vessel aortic branching pattern. The visualized subclavian arteries are normal. Right carotid system: Normal course and caliber without stenosis or evidence of dissection. Left carotid system: Normal course and caliber without stenosis or evidence of dissection.  Vertebral arteries: Left dominant. Vertebral artery origins are not visualized. There is loss of normal flow related enhancement of the right vertebral artery distal V3 segment and V4 segment. IMPRESSION: 1. Multiple small ischemic infarcts, including the right medulla oblongata and multiple foci within the right cerebellar hemisphere. No hemorrhage or mass effect. 2. Loss of flow related enhancement of the distal V3 segment and V4 segment of the right vertebral artery, consistent with occlusion. Magnetic susceptibility effect within the V4 segment on the SWI sequence suggests the presence of thrombus. 3. Otherwise normal MRA of the head and neck. 4. Findings of mild chronic microvascular ischemia. These results were communicated to Dr. Lyda Perone at 6:55 am on 05/07/2018 by text page via the [facility] messaging system. Electronically Signed   By: Deatra Robinson M.D.   On: 05/07/2018 06:56   Mr Brain Wo Contrast  Result Date: 05/07/2018 CLINICAL DATA:  Transient ischemic attack.  Near syncope. EXAM: MRI HEAD WITHOUT CONTRAST MRA HEAD WITHOUT CONTRAST MRA NECK WITHOUT CONTRAST TECHNIQUE: Multiplanar, multiecho pulse sequences of the brain and surrounding structures were obtained without intravenous contrast. Angiographic images of the Circle of Willis were obtained using MRA technique without intravenous contrast. Angiographic images of the neck were obtained using MRA technique without intravenous contrast. Carotid stenosis measurements (when applicable) are obtained utilizing NASCET criteria, using the distal internal carotid diameter as the denominator. COMPARISON:  None. FINDINGS: MRI HEAD FINDINGS BRAIN: The midline structures are normal. Multifocal abnormal diffusion restriction within the right cerebellar hemisphere and in the right half of the medulla oblongata. No supratentorial diffusion restriction. Mild periventricular white matter hyperintensity. The CSF spaces are normal for age, with no  hydrocephalus. Blood-sensitive sequences show no chronic microhemorrhage or superficial siderosis. There is magnetic susceptibility effect within the V4 segment of the right vertebral artery, which also has an abnormal flow void. SKULL AND UPPER CERVICAL SPINE: The visualized skull base, calvarium, upper cervical spine and extracranial soft tissues are normal. SINUSES/ORBITS: No fluid levels or advanced mucosal thickening. No mastoid or middle ear effusion. The orbits are normal. MRA HEAD FINDINGS Intracranial internal carotid arteries: Normal. Anterior cerebral arteries: Normal. Middle cerebral arteries: Normal. Posterior communicating arteries: Present on the left. Posterior cerebral arteries: Normal. Basilar artery: Normal. Superior cerebellar arteries: Normal. Anterior inferior cerebellar arteries: Normal. Posterior inferior cerebellar arteries: Normal on the left. Loss of normal flow related enhancement distally on the right. The. MRA NECK FINDINGS Aortic arch: Normal 3 vessel aortic branching pattern. The visualized subclavian arteries are normal. Right carotid system: Normal course and caliber without stenosis or evidence of dissection. Left carotid system: Normal course and caliber without stenosis or evidence of dissection. Vertebral arteries: Left dominant. Vertebral artery origins are not visualized. There is loss of normal flow related enhancement of the right vertebral artery distal V3 segment and V4 segment. IMPRESSION: 1. Multiple small ischemic infarcts, including the right medulla oblongata and multiple foci within the right cerebellar hemisphere. No hemorrhage or mass effect. 2. Loss of flow related enhancement of the distal V3 segment and V4 segment of the right vertebral artery,  consistent with occlusion. Magnetic susceptibility effect within the V4 segment on the SWI sequence suggests the presence of thrombus. 3. Otherwise normal MRA of the head and neck. 4. Findings of mild chronic  microvascular ischemia. These results were communicated to Dr. Lyda Perone at 6:55 am on 05/07/2018 by text page via the [facility] messaging system. Electronically Signed   By: Deatra Robinson M.D.   On: 05/07/2018 06:56   Dg Chest Port 1 View  Result Date: 05/06/2018 CLINICAL DATA:  Weakness for couple of hours.  Near syncope EXAM: PORTABLE CHEST 1 VIEW COMPARISON:  07/19/2017 FINDINGS: Prominent heart size accentuated by low volumes. Negative mediastinal contours for technique. There is no edema, consolidation, effusion, or pneumothorax. Artifact from EKG leads. IMPRESSION: Low volume chest without acute finding. Electronically Signed   By: Marnee Spring M.D.   On: 05/06/2018 18:15    Cardiac Studies   TTE EF 60-65% no RWMA;s   Patient Profile     72 y.o. male  With DM , Left BKA CRF admitted with pre syncope nausea vertigo and bradycardia In setting of posterior circulation stroke  Assessment & Plan   CVA:  Posterior circulation vertebral occlusion confirmed by angiogram yesterday Neuro recommends DAT Bradycardia: resolved related to CNS event and vagal inputs ECG with no AV block Troponin:  Minor elevation with no chest pain or acute ECG changes no RWMA;s on TTE no need for further w/u   [facility] will sign off.   Medication Recommendations:  DAT Other recommendations (labs, testing, etc):  Per neuro Follow up as an outpatient:  No cardiology f/u needed   For questions or updates, please contact [facility] Please consult [email or web address] for contact info under        Signed, Charlton Haws, MD  05/08/2018, 8:53 AM

## 2018-05-08 NOTE — Discharge Summary (Signed)
Discharge Summary  Kevin Bradford ZOX:[id number] DOB: 1945-08-03  PCP: Georgann Housekeeper, MD  Admit date: 05/06/2018 Discharge date: 05/09/2018  Time spent: , more than 50% time spent on coordination of care, case discussed with neurology  Recommendations for Outpatient Follow-up:  1. F/u with PMD within a week  for hospital discharge follow up, repeat cbc/bmp at follow up 2. F/u with neurology 3. F/u with ortho Dr Lajoyce Corners 4. F/u with Dr Julieanne Cotton in 6 months 5. Continue outpatient neurorehab  Discharge Diagnoses:  Active Hospital Problems   Diagnosis Date Noted  . Stroke (HCC)   . Symptomatic bradycardia 05/06/2018  . Postural dizziness with near syncope 05/06/2018  . Type 2 diabetes mellitus with peripheral neuropathy (HCC)   . Stage 3 chronic kidney disease (HCC)   . Amputated below knee, left (HCC) 11/22/2017  . CKD (chronic kidney disease) stage 3, GFR 30-59 ml/min (HCC) 10/13/2014  . Diabetes (HCC) 10/13/2014    Resolved Hospital Problems  No resolved problems to display.    Discharge Condition: stable  Diet recommendation: heart healthy/carb modified  Filed Weights   05/06/18 1711 05/06/18 2252 05/08/18 0050  Weight: 99.8 kg 100.2 kg 97.6 kg    History of present illness: (per admitting MD Dr Julian Reil) PCP: Georgann Housekeeper, MD  Patient coming from: Home  I have personally briefly reviewed patient's old medical records in [facility] Link  Chief Complaint: Near syncope  HPI: Kevin Bradford is a 72 y.o. male with medical history significant of DM2, CKD stage 3, s/p L BKA.  Patient was in normal state of health today, doing plumbing on a ladder even.  He had been slightly more tired than usual over the past 3-4 days but nothing too significant.  3 mins to an hour after working on the ladder he developed sudden onset of nausea, lightheadedness and sweating.  Immediately felt he needed to lay down, and this made him feel better.  EMS arrived and  noted he was extremely orthostatic in that he would become very symptomatic every time they tried to sit him up or stand him up.   ED Course: In the ED he feels okay laying down, but becomes symptomatic on standing.  Of note HR is in the 70s-80s while laying down, and drops to 40s on sitting.  Given IVF.  Still had lightheadedness and HR drop to 40s when going from seated to standing and trying to ambulate.   Hospital Course:  Principal Problem:   Stroke [facility]) Active Problems:   Diabetes (HCC)   CKD (chronic kidney disease) stage 3, GFR 30-59 ml/min (HCC)   Amputated below knee, left (HCC)   Stage 3 chronic kidney disease (HCC)   Type 2 diabetes mellitus with peripheral neuropathy (HCC)   Symptomatic bradycardia   Postural dizziness with near syncope  Acute CVA: - admitted for imbalance, leaning toward to the right side, gait difficulty, dizzy nausea and sweating with questionable right facial droop. -MRI showed right inferior cerebellum 3 punctate and right lateral medullary infarct, consistent with right PICA infarcts.   -MRA showed right VA occlusion -2D Echo  EF 60-65%. No source of embolus  -a1c pending, last a1c from 11/2017 is 6.7 -Ldl 130 -neurology consulted, recommend "aspirin 325 and Plavix 75 DAPT for 3 months and then Plavix alone given intracranial stenosis.  Continue Lipitor on discharge for stroke prevention." -outpatient neuro rehab , outpatient neurology follow up   IR consulted for cerebral arteriogram  "S/P 4 vessel cerebral arteriogram. RT CFA approach.  Findings. 1.Occluded RT VA at C1 2.Approx 60 % stenosis dominant Lt VA at origin 3.Approx 60 % stenosis origin of RT VA" Patient is instructed to follow up with IR Dr Corliss Skains as well  Sinus bradycardia Resolved with ivf Cardiology consulted who thick this is related "CNS event and vagal inputs " Cardiology signed off  Mild Troponin elevation:  per cardiology "Minor elevation with no chest pain or  acute ECG changes no RWMA;s on TTE ,no need for further w/u"  Insulin dependent DM2 a1c pending, last a1c from 11/2017 is 6.7 Am blood glucose 196 Continue home insulin regimen, resume home meds metformin F/u with pcp  S/ p Left BKA due to diabetic foot F/u with Dr Lajoyce Corners  CKDII Renal function stable at baseline  Anemia of chronic disease hgb around 11-12   Code Status: full Family Communication: daughter in law and wife at bedside Disposition Plan: home   Procedures:  Cerebral arteriogram on 10/21  Consultations:  Neurology  IR Dr Sharin Grave   cardiology  Discharge Exam: BP 122/79   Pulse 85   Temp 98.7 F (37.1 C) (Oral)   Resp 19   Ht 6\' 2"  (1.88 m)   Wt 97.6 kg   SpO2 100%   BMI 27.63 kg/m   General: NAD, aaox3 Cardiovascular: RRR Respiratory: CTABL S/p left bka,   Discharge Instructions You were cared for by a hospitalist during your hospital stay. If you have any questions about your discharge medications or the care you received while you were in the hospital after you are discharged, you can call the unit and asked to speak with the hospitalist on call if the hospitalist that took care of you is not available. Once you are discharged, your primary care physician will handle any further medical issues. Please note that NO REFILLS for any discharge medications will be authorized once you are discharged, as it is imperative that you return to your primary care physician (or establish a relationship with a primary care physician if you do not have one) for your aftercare needs so that they can reassess your need for medications and monitor your lab values.  Discharge Instructions    Ambulatory referral to Neurology   Complete by:  As directed    Follow up with stroke clinic NP (Jessica Vanschaick or Darrol Angel, if both not available, consider Dr. Delia Heady, Dr. Jamelle Rushing, or Dr. Naomie Dean) at [facility] in about 4 weeks.    Diet - low sodium heart healthy   Complete by:  As directed    Carb modified diet   Increase activity slowly   Complete by:  As directed      Allergies as of 05/08/2018      Reactions   Oxycodone Shortness Of Breath      Medication List    TAKE these medications   aspirin 325 MG EC tablet Take 1 tablet (325 mg total) by mouth daily. What changed:  Another medication with the same name was removed. Continue taking this medication, and follow the directions you see here.   atorvastatin 40 MG tablet Commonly known as:  LIPITOR Take 1 tablet (40 mg total) by mouth daily at 6 PM.   clopidogrel 75 MG tablet Commonly known as:  PLAVIX Take 1 tablet (75 mg total) by mouth daily.   insulin aspart 100 UNIT/ML FlexPen Commonly known as:  NOVOLOG Inject 4 Units into the skin 3 (three) times daily with meals. What changed:  how much  to take   Insulin Detemir 100 UNIT/ML Pen Commonly known as:  LEVEMIR Inject 25 Units into the skin daily at 10 pm. What changed:  how much to take   metFORMIN 500 MG tablet Commonly known as:  GLUCOPHAGE Take 1 tablet (500 mg total) by mouth 2 (two) times daily with a meal.      Allergies  Allergen Reactions  . Oxycodone Shortness Of Breath   Follow-up Information    Georgann Housekeeper, MD In 1 week.   Specialty:  Internal Medicine Why:  hospital discharge follow up Office will call patient Contact information: [address] [place] [address] [phone]        [facility] Follow up in 4 week(s).   Contact information: [address]     [address] [place] [address] [phone]       Julieanne Cotton, MD Follow up.   Specialties:  Interventional Radiology, Radiology Contact information: [address] [place] [address] [phone]            The results of significant diagnostics from this hospitalization (including imaging, microbiology, ancillary and  laboratory) are listed below for reference.    Significant Diagnostic Studies: Mr Shirlee Latch NU Contrast  Result Date: 05/07/2018 CLINICAL DATA:  Transient ischemic attack.  Near syncope. EXAM: MRI HEAD WITHOUT CONTRAST MRA HEAD WITHOUT CONTRAST MRA NECK WITHOUT CONTRAST TECHNIQUE: Multiplanar, multiecho pulse sequences of the brain and surrounding structures were obtained without intravenous contrast. Angiographic images of the Circle of Willis were obtained using MRA technique without intravenous contrast. Angiographic images of the neck were obtained using MRA technique without intravenous contrast. Carotid stenosis measurements (when applicable) are obtained utilizing NASCET criteria, using the distal internal carotid diameter as the denominator. COMPARISON:  None. FINDINGS: MRI HEAD FINDINGS BRAIN: The midline structures are normal. Multifocal abnormal diffusion restriction within the right cerebellar hemisphere and in the right half of the medulla oblongata. No supratentorial diffusion restriction. Mild periventricular white matter hyperintensity. The CSF spaces are normal for age, with no hydrocephalus. Blood-sensitive sequences show no chronic microhemorrhage or superficial siderosis. There is magnetic susceptibility effect within the V4 segment of the right vertebral artery, which also has an abnormal flow void. SKULL AND UPPER CERVICAL SPINE: The visualized skull base, calvarium, upper cervical spine and extracranial soft tissues are normal. SINUSES/ORBITS: No fluid levels or advanced mucosal thickening. No mastoid or middle ear effusion. The orbits are normal. MRA HEAD FINDINGS Intracranial internal carotid arteries: Normal. Anterior cerebral arteries: Normal. Middle cerebral arteries: Normal. Posterior communicating arteries: Present on the left. Posterior cerebral arteries: Normal. Basilar artery: Normal. Superior cerebellar arteries: Normal. Anterior inferior cerebellar arteries: Normal. Posterior  inferior cerebellar arteries: Normal on the left. Loss of normal flow related enhancement distally on the right. The. MRA NECK FINDINGS Aortic arch: Normal 3 vessel aortic branching pattern. The visualized subclavian arteries are normal. Right carotid system: Normal course and caliber without stenosis or evidence of dissection. Left carotid system: Normal course and caliber without stenosis or evidence of dissection. Vertebral arteries: Left dominant. Vertebral artery origins are not visualized. There is loss of normal flow related enhancement of the right vertebral artery distal V3 segment and V4 segment. IMPRESSION: 1. Multiple small ischemic infarcts, including the right medulla oblongata and multiple foci within the right cerebellar hemisphere. No hemorrhage or mass effect. 2. Loss of flow related enhancement of the distal V3 segment and V4 segment of the right vertebral artery, consistent with occlusion. Magnetic  susceptibility effect within the V4 segment on the SWI sequence suggests the presence of thrombus. 3. Otherwise normal MRA of the head and neck. 4. Findings of mild chronic microvascular ischemia. These results were communicated to Dr. Lyda Perone at 6:55 am on 05/07/2018 by text page via the [facility] messaging system. Electronically Signed   By: Deatra Robinson M.D.   On: 05/07/2018 06:56   Mr Maxine Glenn Neck Wo Contrast  Result Date: 05/07/2018 CLINICAL DATA:  Transient ischemic attack.  Near syncope. EXAM: MRI HEAD WITHOUT CONTRAST MRA HEAD WITHOUT CONTRAST MRA NECK WITHOUT CONTRAST TECHNIQUE: Multiplanar, multiecho pulse sequences of the brain and surrounding structures were obtained without intravenous contrast. Angiographic images of the Circle of Willis were obtained using MRA technique without intravenous contrast. Angiographic images of the neck were obtained using MRA technique without intravenous contrast. Carotid stenosis measurements (when applicable) are obtained utilizing NASCET criteria,  using the distal internal carotid diameter as the denominator. COMPARISON:  None. FINDINGS: MRI HEAD FINDINGS BRAIN: The midline structures are normal. Multifocal abnormal diffusion restriction within the right cerebellar hemisphere and in the right half of the medulla oblongata. No supratentorial diffusion restriction. Mild periventricular white matter hyperintensity. The CSF spaces are normal for age, with no hydrocephalus. Blood-sensitive sequences show no chronic microhemorrhage or superficial siderosis. There is magnetic susceptibility effect within the V4 segment of the right vertebral artery, which also has an abnormal flow void. SKULL AND UPPER CERVICAL SPINE: The visualized skull base, calvarium, upper cervical spine and extracranial soft tissues are normal. SINUSES/ORBITS: No fluid levels or advanced mucosal thickening. No mastoid or middle ear effusion. The orbits are normal. MRA HEAD FINDINGS Intracranial internal carotid arteries: Normal. Anterior cerebral arteries: Normal. Middle cerebral arteries: Normal. Posterior communicating arteries: Present on the left. Posterior cerebral arteries: Normal. Basilar artery: Normal. Superior cerebellar arteries: Normal. Anterior inferior cerebellar arteries: Normal. Posterior inferior cerebellar arteries: Normal on the left. Loss of normal flow related enhancement distally on the right. The. MRA NECK FINDINGS Aortic arch: Normal 3 vessel aortic branching pattern. The visualized subclavian arteries are normal. Right carotid system: Normal course and caliber without stenosis or evidence of dissection. Left carotid system: Normal course and caliber without stenosis or evidence of dissection. Vertebral arteries: Left dominant. Vertebral artery origins are not visualized. There is loss of normal flow related enhancement of the right vertebral artery distal V3 segment and V4 segment. IMPRESSION: 1. Multiple small ischemic infarcts, including the right medulla oblongata  and multiple foci within the right cerebellar hemisphere. No hemorrhage or mass effect. 2. Loss of flow related enhancement of the distal V3 segment and V4 segment of the right vertebral artery, consistent with occlusion. Magnetic susceptibility effect within the V4 segment on the SWI sequence suggests the presence of thrombus. 3. Otherwise normal MRA of the head and neck. 4. Findings of mild chronic microvascular ischemia. These results were communicated to Dr. Lyda Perone at 6:55 am on 05/07/2018 by text page via the [facility] messaging system. Electronically Signed   By: Deatra Robinson M.D.   On: 05/07/2018 06:56   Mr Brain Wo Contrast  Result Date: 05/07/2018 CLINICAL DATA:  Transient ischemic attack.  Near syncope. EXAM: MRI HEAD WITHOUT CONTRAST MRA HEAD WITHOUT CONTRAST MRA NECK WITHOUT CONTRAST TECHNIQUE: Multiplanar, multiecho pulse sequences of the brain and surrounding structures were obtained without intravenous contrast. Angiographic images of the Circle of Willis were obtained using MRA technique without intravenous contrast. Angiographic images of the neck were obtained using MRA technique without intravenous contrast.  Carotid stenosis measurements (when applicable) are obtained utilizing NASCET criteria, using the distal internal carotid diameter as the denominator. COMPARISON:  None. FINDINGS: MRI HEAD FINDINGS BRAIN: The midline structures are normal. Multifocal abnormal diffusion restriction within the right cerebellar hemisphere and in the right half of the medulla oblongata. No supratentorial diffusion restriction. Mild periventricular white matter hyperintensity. The CSF spaces are normal for age, with no hydrocephalus. Blood-sensitive sequences show no chronic microhemorrhage or superficial siderosis. There is magnetic susceptibility effect within the V4 segment of the right vertebral artery, which also has an abnormal flow void. SKULL AND UPPER CERVICAL SPINE: The visualized skull base,  calvarium, upper cervical spine and extracranial soft tissues are normal. SINUSES/ORBITS: No fluid levels or advanced mucosal thickening. No mastoid or middle ear effusion. The orbits are normal. MRA HEAD FINDINGS Intracranial internal carotid arteries: Normal. Anterior cerebral arteries: Normal. Middle cerebral arteries: Normal. Posterior communicating arteries: Present on the left. Posterior cerebral arteries: Normal. Basilar artery: Normal. Superior cerebellar arteries: Normal. Anterior inferior cerebellar arteries: Normal. Posterior inferior cerebellar arteries: Normal on the left. Loss of normal flow related enhancement distally on the right. The. MRA NECK FINDINGS Aortic arch: Normal 3 vessel aortic branching pattern. The visualized subclavian arteries are normal. Right carotid system: Normal course and caliber without stenosis or evidence of dissection. Left carotid system: Normal course and caliber without stenosis or evidence of dissection. Vertebral arteries: Left dominant. Vertebral artery origins are not visualized. There is loss of normal flow related enhancement of the right vertebral artery distal V3 segment and V4 segment. IMPRESSION: 1. Multiple small ischemic infarcts, including the right medulla oblongata and multiple foci within the right cerebellar hemisphere. No hemorrhage or mass effect. 2. Loss of flow related enhancement of the distal V3 segment and V4 segment of the right vertebral artery, consistent with occlusion. Magnetic susceptibility effect within the V4 segment on the SWI sequence suggests the presence of thrombus. 3. Otherwise normal MRA of the head and neck. 4. Findings of mild chronic microvascular ischemia. These results were communicated to Dr. Lyda Perone at 6:55 am on 05/07/2018 by text page via the [facility] messaging system. Electronically Signed   By: Deatra Robinson M.D.   On: 05/07/2018 06:56   Dg Chest Port 1 View  Result Date: 05/06/2018 CLINICAL DATA:  Weakness for  couple of hours.  Near syncope EXAM: PORTABLE CHEST 1 VIEW COMPARISON:  07/19/2017 FINDINGS: Prominent heart size accentuated by low volumes. Negative mediastinal contours for technique. There is no edema, consolidation, effusion, or pneumothorax. Artifact from EKG leads. IMPRESSION: Low volume chest without acute finding. Electronically Signed   By: Marnee Spring M.D.   On: 05/06/2018 18:15    Microbiology: Recent Results (from the past 240 hour(s))  MRSA PCR Screening     Status: None   Collection Time: 05/06/18 10:58 PM  Result Value Ref Range Status   MRSA by PCR NEGATIVE NEGATIVE Final    Comment:        The GeneXpert MRSA Assay (FDA approved for NASAL specimens only), is one component of a comprehensive MRSA colonization surveillance program. It is not intended to diagnose MRSA infection nor to guide or monitor treatment for MRSA infections. Performed at [facility], [address], [place], [place] [address]      Labs: Basic Metabolic Panel: Recent Labs  Lab 05/06/18 1720 05/08/18 0256  NA 137 138  K 4.2 3.9  CL 109 107  CO2 22 23  GLUCOSE 211* 196*  BUN 24*  13  CREATININE 1.31* 1.19  CALCIUM 9.0 8.9   Liver Function Tests: Recent Labs  Lab 05/06/18 1720  AST 19  ALT 15  ALKPHOS 80  BILITOT 0.9  PROT 6.3*  ALBUMIN 3.9   No results for input(s): LIPASE, AMYLASE in the last 168 hours. No results for input(s): AMMONIA in the last 168 hours. CBC: Recent Labs  Lab 05/06/18 1720  WBC 8.0  NEUTROABS 6.2  HGB 12.1*  HCT 37.6*  MCV 88.7  PLT 167   Cardiac Enzymes: Recent Labs  Lab 05/06/18 2319 05/07/18 0423 05/07/18 1025 05/07/18 1649  TROPONINI <0.03 <0.03 0.17* 0.44*   BNP: BNP (last 3 results) No results for input(s): BNP in the last 8760 hours.  ProBNP (last 3 results) No results for input(s): PROBNP in the last 8760 hours.  CBG: Recent Labs  Lab 05/07/18 1716 05/07/18 2057 05/08/18 0120 05/08/18 0730 05/08/18 1125    GLUCAP 136* 149* 121* 178* 138*       Signed:  Albertine Grates MD, PhD  [facility] 05/09/2018, 12:11 AM

## 2018-05-08 NOTE — Progress Notes (Signed)
PT Cancellation Note  Patient Details Name: Kevin Bradford MRN: [id number] DOB: 01-31-1946   Cancelled Treatment:    Reason Eval/Treat Not Completed: Other (comment) Returned page from RN, who reports that patient is discharging and will be returning to outpatient neuro rehab, advises we can cancel PT order. Also spoke to case manager who confirmed that patient is discharging this afternoon and plans to resume services at outpatient neuro rehab, confirms that at this point there is no need for PT consult. PT signing off, thank you for the referral.    Nedra Hai PT, DPT, CBIS  Supplemental Physical Therapist [facility]    Pager [phone] Acute Rehab Office [phone]

## 2018-05-08 NOTE — Plan of Care (Signed)
  Problem: Education: Goal: Knowledge of General Education information will improve Description Including pain rating scale, medication(s)/side effects and non-pharmacologic comfort measures Outcome: Progressing   Problem: Health Behavior/Discharge Planning: Goal: Ability to manage health-related needs will improve Outcome: Progressing   

## 2018-05-08 NOTE — Progress Notes (Signed)
Patient ready for discharge. 

## 2018-05-08 NOTE — Progress Notes (Signed)
CM talked to patient at the bedside with his family members present; he goes to the Outpatient Neuro Rehab for Outpatient Physical Therapy and plans to continue his therapy after discharge; Alexis Goodell [phone]

## 2018-05-09 ENCOUNTER — Encounter (HOSPITAL_COMMUNITY): Payer: Self-pay | Admitting: Interventional Radiology

## 2018-05-09 ENCOUNTER — Ambulatory Visit: Payer: Medicare Other | Admitting: Physical Therapy

## 2018-05-09 LAB — HEMOGLOBIN A1C
Hgb A1c MFr Bld: 8.2 % — ABNORMAL HIGH (ref 4.8–5.6)
Mean Plasma Glucose: 189 mg/dL

## 2018-05-12 DIAGNOSIS — R52 Pain, unspecified: Secondary | ICD-10-CM | POA: Diagnosis not present

## 2018-05-12 DIAGNOSIS — G4489 Other headache syndrome: Secondary | ICD-10-CM | POA: Diagnosis not present

## 2018-05-12 DIAGNOSIS — I10 Essential (primary) hypertension: Secondary | ICD-10-CM | POA: Diagnosis not present

## 2018-05-12 DIAGNOSIS — R231 Pallor: Secondary | ICD-10-CM | POA: Diagnosis not present

## 2018-05-12 DIAGNOSIS — R42 Dizziness and giddiness: Secondary | ICD-10-CM | POA: Diagnosis not present

## 2018-05-13 ENCOUNTER — Emergency Department (HOSPITAL_COMMUNITY)
Admission: EM | Admit: 2018-05-13 | Discharge: 2018-05-13 | Disposition: A | Discharge status: Home / Self Care | Payer: Medicare Other | Attending: Emergency Medicine | Admitting: Emergency Medicine

## 2018-05-13 ENCOUNTER — Other Ambulatory Visit: Payer: Self-pay

## 2018-05-13 ENCOUNTER — Emergency Department (HOSPITAL_COMMUNITY): Payer: Medicare Other

## 2018-05-13 DIAGNOSIS — I639 Cerebral infarction, unspecified: Secondary | ICD-10-CM | POA: Diagnosis not present

## 2018-05-13 DIAGNOSIS — N183 Chronic kidney disease, stage 3 (moderate): Secondary | ICD-10-CM | POA: Diagnosis not present

## 2018-05-13 DIAGNOSIS — Z89512 Acquired absence of left leg below knee: Secondary | ICD-10-CM | POA: Diagnosis not present

## 2018-05-13 DIAGNOSIS — Z794 Long term (current) use of insulin: Secondary | ICD-10-CM | POA: Insufficient documentation

## 2018-05-13 DIAGNOSIS — Z79899 Other long term (current) drug therapy: Secondary | ICD-10-CM | POA: Diagnosis not present

## 2018-05-13 DIAGNOSIS — R42 Dizziness and giddiness: Secondary | ICD-10-CM | POA: Insufficient documentation

## 2018-05-13 DIAGNOSIS — E1122 Type 2 diabetes mellitus with diabetic chronic kidney disease: Secondary | ICD-10-CM | POA: Insufficient documentation

## 2018-05-13 DIAGNOSIS — I63543 Cerebral infarction due to unspecified occlusion or stenosis of bilateral cerebellar arteries: Secondary | ICD-10-CM | POA: Diagnosis not present

## 2018-05-13 DIAGNOSIS — Z7901 Long term (current) use of anticoagulants: Secondary | ICD-10-CM | POA: Insufficient documentation

## 2018-05-13 DIAGNOSIS — Z8673 Personal history of transient ischemic attack (TIA), and cerebral infarction without residual deficits: Secondary | ICD-10-CM | POA: Diagnosis not present

## 2018-05-13 DIAGNOSIS — Z7982 Long term (current) use of aspirin: Secondary | ICD-10-CM | POA: Insufficient documentation

## 2018-05-13 LAB — BASIC METABOLIC PANEL
Anion gap: 11 (ref 5–15)
BUN: 24 mg/dL — AB (ref 8–23)
CHLORIDE: 103 mmol/L (ref 98–111)
CO2: 24 mmol/L (ref 22–32)
CREATININE: 1.33 mg/dL — AB (ref 0.61–1.24)
Calcium: 9.4 mg/dL (ref 8.9–10.3)
GFR calc Af Amer: >60 mL/min (ref >60)
GFR calc non Af Amer: 52 mL/min — ABNORMAL LOW (ref >60)
GLUCOSE: 191 mg/dL — AB (ref 70–99)
POTASSIUM: 4.2 mmol/L (ref 3.5–5.1)
SODIUM: 138 mmol/L (ref 135–145)

## 2018-05-13 LAB — CBC
HEMATOCRIT: 35.6 % — AB (ref 39.0–52.0)
Hemoglobin: 11.5 g/dL — ABNORMAL LOW (ref 13.0–17.0)
MCH: 28.8 pg (ref 26.0–34.0)
MCHC: 32.3 g/dL (ref 30.0–36.0)
MCV: 89.2 fL (ref 80.0–100.0)
Platelets: 173 10*3/uL (ref 150–400)
RBC: 3.99 MIL/uL — ABNORMAL LOW (ref 4.22–5.81)
RDW: 13.9 % (ref 11.5–15.5)
WBC: 6 10*3/uL (ref 4.0–10.5)
nRBC: 0 % (ref 0.0–0.2)

## 2018-05-13 LAB — I-STAT TROPONIN, ED: Troponin i, poc: 0.01 ng/mL (ref 0.00–0.08)

## 2018-05-13 NOTE — ED Notes (Signed)
assit pt. On the side of bed for a sitting position with walker in front  Him .Ask pt.to push up grab the handles on the walker.assit pt.with ambulate.in the hallway.back to room assit back to the side of bed  .place pt back in bed

## 2018-05-13 NOTE — ED Triage Notes (Signed)
Patient c/o dizziness that has been ongoing since his last admission. Was mildly clammy/diaphoretic upon arrival - maintains closure of eyes. Denies CP or SOB.

## 2018-05-13 NOTE — ED Provider Notes (Addendum)
[facility] EMERGENCY DEPARTMENT Provider Note   CSN: [id number] Arrival date & time: 05/13/18  0038     History   Chief Complaint Chief Complaint  Patient presents with  . Dizziness    HPI Percy Winterrowd is a 72 y.o. male.  Patient is a 72 year old male who presents with dizziness.  He was recently admitted last week with dizziness associated with some right-sided weakness.  He was found to have punctate infarcts in his cerebellum and right lateral medulla.  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  Medical management was started.  He was asymptomatic on discharge.  He states he was not having any dizziness or right-sided weakness.  He has been doing fine since that time when he was discharged on October 22.  He states that he went to a soccer game earlier today and had no difficulties.  He states at 11:30 PM this evening he had onset of dizziness again.  He became a little diaphoretic.  He had some discomfort in his right cheek with a headache that spread up to the right side of his head.  He also felt like he is been leaning to the right and is unable to stand without assistance.  These are the same symptoms that he was having with his prior stroke.  He states that headache has improved but he still has a dizziness and inability to stand without assistance.  He denies any speech deficits.  No vision changes.  He took 2 baby aspirin in addition to his regular daily dose of aspirin.     Past Medical History:  Diagnosis Date  . Arthritis    "joint pain" (08/16/2017)  . Cellulitis and abscess of leg 10/18/2014  . CKD (chronic kidney disease) stage 3, GFR 30-59 ml/min (HCC)    creatinine increases with antibiotics per pt, no known kidney disease per patient  . Dehiscence of closure of skin, subsequent encounter   . Diabetic foot ulcer (HCC)    left  . Diabetic peripheral neuropathy (HCC)   . GERD (gastroesophageal reflux disease)    12/26/2017-  "not this year."   . Osteomyelitis (HCC)    left transmetatarsal   . Stroke (HCC)   . Type II diabetes mellitus [facility])     Patient Active Problem List   Diagnosis Date Noted  . Stroke (HCC)   . Symptomatic bradycardia 05/06/2018  . Postural dizziness with near syncope 05/06/2018  . Labile blood glucose   . Acute blood loss anemia   . Labile blood pressure   . Stage 3 chronic kidney disease (HCC)   . Type 2 diabetes mellitus with peripheral neuropathy (HCC)   . Post-operative pain   . Amputation of left lower extremity below knee upon examination (HCC) 11/24/2017  . Amputated below knee, left (HCC) 11/22/2017  . Osteomyelitis of left foot (HCC)   . Dehiscence of amputation stump (HCC) 08/11/2017  . S/P transmetatarsal amputation of foot, left (HCC) 07/19/2017  . Subacute osteomyelitis, left ankle and foot (HCC) 07/17/2017  . Diabetic ulcer of left foot associated with type 2 diabetes mellitus (HCC)   . Diabetes mellitus with renal complications (HCC) 10/17/2014  . Diabetes (HCC) 10/13/2014  . Anemia 10/13/2014  . CKD (chronic kidney disease) stage 3, GFR 30-59 ml/min (HCC) 10/13/2014  . Hyperkalemia 10/13/2014    Past Surgical History:  Procedure Laterality Date  . AMPUTATION Left 07/19/2017   Procedure: LEFT TRANSMETATARSAL AMPUTATION;  Surgeon: Nadara Mustard, MD;  Location: [facility] OR;  Service: Orthopedics;  Laterality: Left;  . AMPUTATION Left 08/16/2017   REVISION LEFT TRANSMETATARSAL AMPUTATION  . AMPUTATION Left 11/22/2017   Procedure: LEFT BELOW KNEE AMPUTATION;  Surgeon: Nadara Mustard, MD;  Location: [facility] OR;  Service: Orthopedics;  Laterality: Left;  . CATARACT EXTRACTION W/ INTRAOCULAR LENS  IMPLANT, BILATERAL  ~ 2016  . EYE SURGERY Bilateral    catarct  . I&D EXTREMITY Left 10/17/2014   Procedure: IRRIGATION AND DEBRIDEMENT EXTREMITY LEFT FOOT;  Surgeon: Nadara Mustard, MD;  Location: [facility] OR;  Service: Orthopedics;  Laterality: Left;  . IR ANGIO INTRA EXTRACRAN SEL COM  CAROTID INNOMINATE BILAT MOD SED  05/07/2018  . IR ANGIO VERTEBRAL SEL SUBCLAVIAN INNOMINATE BILAT MOD SED  05/07/2018  . STUMP REVISION Left 08/16/2017   Procedure: REVISION LEFT TRANSMETATARSAL AMPUTATION;  Surgeon: Nadara Mustard, MD;  Location: [facility] OR;  Service: Orthopedics;  Laterality: Left;  . STUMP REVISION Left 12/27/2017   Procedure: LEFT BELOW KNEE AMPUTATION REVISION;  Surgeon: Nadara Mustard, MD;  Location: [facility] OR;  Service: Orthopedics;  Laterality: Left;  . TOE AMPUTATION Right 2013   5th toe        Home Medications    Prior to Admission medications   Medication Sig Start Date End Date Taking? Authorizing Provider  aspirin 325 MG EC tablet Take 1 tablet (325 mg total) by mouth daily. 05/08/18 08/06/18  Albertine Grates, MD  atorvastatin (LIPITOR) 40 MG tablet Take 1 tablet (40 mg total) by mouth daily at 6 PM. 05/08/18   Albertine Grates, MD  clopidogrel (PLAVIX) 75 MG tablet Take 1 tablet (75 mg total) by mouth daily. 05/09/18   Albertine Grates, MD  insulin aspart (NOVOLOG FLEXPEN) 100 UNIT/ML FlexPen Inject 4 Units into the skin 3 (three) times daily with meals. Patient taking differently: Inject 8 Units into the skin 3 (three) times daily with meals.  11/30/17   Angiulli, Mcarthur Rossetti, PA-C  Insulin Detemir (LEVEMIR FLEXTOUCH) 100 UNIT/ML Pen Inject 25 Units into the skin daily at 10 pm. Patient taking differently: Inject 40 Units into the skin daily at 10 pm.  11/30/17   Angiulli, Mcarthur Rossetti, PA-C  metFORMIN (GLUCOPHAGE) 500 MG tablet Take 1 tablet (500 mg total) by mouth 2 (two) times daily with a meal. 11/30/17   Angiulli, Mcarthur Rossetti, PA-C    Family History Family History  Problem Relation Age of Onset  . Dementia Mother     Social History Social History   Tobacco Use  . Smoking status: Never Smoker  . Smokeless tobacco: Never Used  Substance Use Topics  . Alcohol use: Yes    Comment:  "maybe 1 beer 2 times a month"-12/26/2017  . Drug use: No     Allergies   Oxycodone   Review of  Systems Review of Systems  Constitutional: Positive for diaphoresis and fatigue. Negative for chills and fever.  HENT: Negative for congestion, rhinorrhea and sneezing.   Eyes: Negative.   Respiratory: Negative for cough, chest tightness and shortness of breath.   Cardiovascular: Negative for chest pain and leg swelling.  Gastrointestinal: Negative for abdominal pain, blood in stool, diarrhea, nausea and vomiting.  Genitourinary: Negative for difficulty urinating, flank pain, frequency and hematuria.  Musculoskeletal: Negative for arthralgias and back pain.  Skin: Negative for rash.  Neurological: Positive for dizziness, weakness and headaches. Negative for speech difficulty and numbness.     Physical Exam Updated Vital Signs BP (!) 160/124   Pulse 77  Temp 98.4 F (36.9 C)   Resp 18   Ht 6\' 2"  (1.88 m)   Wt 99.8 kg   SpO2 98%   BMI 28.25 kg/m   Physical Exam  Constitutional: He is oriented to person, place, and time. He appears well-developed and well-nourished.  HENT:  Head: Normocephalic and atraumatic.  Eyes: Pupils are equal, round, and reactive to light.  No nystagmus  Neck: Normal range of motion. Neck supple.  Cardiovascular: Normal rate, regular rhythm and normal heart sounds.  Pulmonary/Chest: Effort normal and breath sounds normal. No respiratory distress. He has no wheezes. He has no rales. He exhibits no tenderness.  Abdominal: Soft. Bowel sounds are normal. There is no tenderness. There is no rebound and no guarding.  Musculoskeletal: Normal range of motion. He exhibits no edema.  Lymphadenopathy:    He has no cervical adenopathy.  Neurological: He is alert and oriented to person, place, and time.  Motor 5/5 all extremities Sensation grossly intact to LT all extremities Finger to Nose intact, no pronator drift CN II-XII grossly intact Gait normal Visual fields full to confrontation  Skin: Skin is warm and dry. No rash noted.  Psychiatric: He has a  normal mood and affect.     ED Treatments / Results  Labs (all labs ordered are listed, but only abnormal results are displayed) Labs Reviewed  BASIC METABOLIC PANEL - Abnormal; Notable for the following components:      Result Value   Glucose, Bld 191 (*)    BUN 24 (*)    Creatinine, Ser 1.33 (*)    GFR calc non Af Amer 52 (*)    All other components within normal limits  CBC - Abnormal; Notable for the following components:   RBC 3.99 (*)    Hemoglobin 11.5 (*)    HCT 35.6 (*)    All other components within normal limits  URINALYSIS, ROUTINE W REFLEX MICROSCOPIC  I-STAT TROPONIN, ED    EKG EKG Interpretation  Date/Time:  Sunday May 13 2018 00:44:37 EDT Ventricular Rate:  67 PR Interval:    QRS Duration: 102 QT Interval:  434 QTC Calculation: 459 R Axis:   -43 Text Interpretation:  Sinus rhythm Left axis deviation Low voltage, extremity leads Abnormal R-wave progression, early transition since last tracing no significant change Confirmed by Rolan Bucco [phone]) on 05/13/2018 1:14:24 AM   Radiology Mr Brain Wo Contrast  Result Date: 05/13/2018 CLINICAL DATA:  72 y/o  M; follow-up of stroke. EXAM: MRI HEAD WITHOUT CONTRAST TECHNIQUE: Multiplanar, multiecho pulse sequences of the brain and surrounding structures were obtained without intravenous contrast. COMPARISON:  05/07/2018 MRI of the head. 05/07/2018 cerebral angiogram. FINDINGS: Brain: Multiple subcentimeter infarctions within the right lateral medulla the right cerebellar hemisphere are stable in distribution with nearly normalized diffusivity compatible with subacute infarction. No associated hemorrhage or mass effect. No new focus of reduced diffusion to indicate interval acute or early subacute infarction. Stable nonspecific T2 FLAIR hyperintensities in subcortical and periventricular white matter are compatible with mild chronic microvascular ischemic changes for age. Mild volume loss of the brain. No  extra-axial collection, hydrocephalus, mass effect, or herniation. Vascular: Stable abnormal flow void within the right vertebral artery and susceptibility hypointensity, probably representing thrombus. Skull and upper cervical spine: Normal marrow signal. Sinuses/Orbits: Negative. Other: None. IMPRESSION: 1. Subcentimeter subacute infarcts within the right lateral medulla and right cerebellar hemisphere are stable in distribution from the prior MRI of the head. No associated hemorrhage or mass effect. 2. Stable  abnormal flow void within the right vertebral artery, suspected thrombus. 3. No new acute intracranial abnormality. 4. Stable mild chronic microvascular ischemic changes and volume loss of the brain. Electronically Signed   By: Mitzi Hansen M.D.   On: 05/13/2018 03:23    Procedures Procedures (including critical care time)  Medications Ordered in ED Medications - No data to display   Initial Impression / Assessment and Plan / ED Course  I have reviewed the triage vital signs and the nursing notes.  Pertinent labs & imaging results that were available during my care of the patient were reviewed by me and considered in my medical decision making (see chart for details).     Patient is a 72 year old who presents with dizziness and ataxia.  He had similar symptoms from a recent stroke and was discharged 5 days ago.  I did consult Dr. Wilford Corner with neurology who recommends a repeat MRI.  The MRI does not show any acute abnormalities or new strokes.  HIs labs are non-concerning. Dr. Wilford Corner feels that patient can be discharged home given this and that this is a normal progression from his recent stroke.  He is feeling better.  He still has some mild dizziness but is able to ambulate with a walker without assistance.  He was discharged home in good condition.  Has f/u scheduled with Neurology.  Return precautions given.  Final Clinical Impressions(s) / ED Diagnoses   Final diagnoses:    Dizziness    ED Discharge Orders    None       Rolan Bucco, MD 05/13/18 1610    Rolan Bucco, MD 05/13/18 [phone]

## 2018-05-14 ENCOUNTER — Ambulatory Visit: Payer: Medicare Other | Admitting: Physical Therapy

## 2018-05-16 ENCOUNTER — Ambulatory Visit: Payer: Medicare Other | Admitting: Physical Therapy

## 2018-05-16 ENCOUNTER — Encounter: Payer: Self-pay | Admitting: Physical Therapy

## 2018-05-16 DIAGNOSIS — M6281 Muscle weakness (generalized): Secondary | ICD-10-CM

## 2018-05-16 DIAGNOSIS — R2681 Unsteadiness on feet: Secondary | ICD-10-CM

## 2018-05-16 DIAGNOSIS — Z9181 History of falling: Secondary | ICD-10-CM

## 2018-05-16 DIAGNOSIS — R293 Abnormal posture: Secondary | ICD-10-CM | POA: Diagnosis not present

## 2018-05-16 DIAGNOSIS — R2689 Other abnormalities of gait and mobility: Secondary | ICD-10-CM | POA: Diagnosis not present

## 2018-05-17 NOTE — Progress Notes (Signed)
Thank you for the update.  Please reach out if you need any assistance with future orders for ongoing PT due to recent infarct. Shanda Bumps, NP

## 2018-05-17 NOTE — Therapy (Signed)
[facility] [address] [place], [place], [address] Phone: [phone]   Fax:  [phone]  Physical Therapy Treatment  Patient Details  Name: Kevin Bradford MRN: [id number] Date of Birth: 12/11/1945 Referring Provider (PT): Meridee Score, MD (cc: [facility] Venancio Poisson, NP)   Encounter Date: 05/16/2018  PT End of Session - 05/16/18 1800    Visit Number  18   recert sent 09OB visit so PN at 28   Number of Visits  42    Date for PT Re-Evaluation  08/10/18    Authorization Type  Medicare & AETNA Medicare 80% AETNA covers 20% once $300 deductible (at eval $91.17 met)    Authorization Time Period  recert sent 09GG visit so PN at 28    PT Start Time  0845    PT Stop Time  0930    PT Time Calculation (min)  45 min    Equipment Utilized During Treatment  Gait belt    Activity Tolerance  Patient tolerated treatment well;Patient limited by fatigue    Behavior During Therapy  [facility] for tasks assessed/performed       Past Medical History:  Diagnosis Date  . Arthritis    "joint pain" (08/16/2017)  . Cellulitis and abscess of leg 10/18/2014  . CKD (chronic kidney disease) stage 3, GFR 30-59 ml/min (HCC)    creatinine increases with antibiotics per pt, no known kidney disease per patient  . Dehiscence of closure of skin, subsequent encounter   . Diabetic foot ulcer ([place])    left  . Diabetic peripheral neuropathy ([place])   . GERD (gastroesophageal reflux disease)    12/26/2017- "not this year."   . Osteomyelitis ([place])    left transmetatarsal   . Stroke ([employer])   . Type II diabetes mellitus ([place])     Past Surgical History:  Procedure Laterality Date  . AMPUTATION Left 07/19/2017   Procedure: LEFT TRANSMETATARSAL AMPUTATION;  Surgeon: Newt Minion, MD;  Location: [place];  Service: Orthopedics;  Laterality: Left;  . AMPUTATION Left 08/16/2017   REVISION LEFT TRANSMETATARSAL AMPUTATION  . AMPUTATION Left 11/22/2017   Procedure: LEFT  BELOW KNEE AMPUTATION;  Surgeon: Newt Minion, MD;  Location: [place];  Service: Orthopedics;  Laterality: Left;  . CATARACT EXTRACTION W/ INTRAOCULAR LENS  IMPLANT, BILATERAL  ~ 2016  . EYE SURGERY Bilateral    catarct  . I&D EXTREMITY Left 10/17/2014   Procedure: IRRIGATION AND DEBRIDEMENT EXTREMITY LEFT FOOT;  Surgeon: Newt Minion, MD;  Location: [place];  Service: Orthopedics;  Laterality: Left;  . IR ANGIO INTRA EXTRACRAN SEL COM CAROTID INNOMINATE BILAT MOD SED  05/07/2018  . IR ANGIO VERTEBRAL SEL SUBCLAVIAN INNOMINATE BILAT MOD SED  05/07/2018  . STUMP REVISION Left 08/16/2017   Procedure: REVISION LEFT TRANSMETATARSAL AMPUTATION;  Surgeon: Newt Minion, MD;  Location: [place];  Service: Orthopedics;  Laterality: Left;  . STUMP REVISION Left 12/27/2017   Procedure: LEFT BELOW KNEE AMPUTATION REVISION;  Surgeon: Newt Minion, MD;  Location: [place];  Service: Orthopedics;  Laterality: Left;  . TOE AMPUTATION Right 2013   5th toe    There were no vitals filed for this visit.  Subjective Assessment - 05/16/18 0854    Subjective  Patient was hospitalized 10/20-10/23 stroke right cerebellum 3 punctate & right lateral medullary infarct. Return to ED 10/27 dizziness. He has less energy & balance since CVA. He has been sleeping downstairs until last night.     Pertinent  History  L TTA, DM, CKD3, PVD, arthritis, peripheral neuropathy    Limitations  Lifting;Standing;Walking;House hold activities    Patient Stated Goals  To use prosthesis to be active fish, yard work, play with grandkids, ride bicycle. Swim in pool.     Currently in Pain?  No/denies         [facility] PT Assessment - 05/16/18 0845      Assessment   Medical Diagnosis  Left Transtibial Amputation, CVA    Referring Provider (PT)  Meridee Score, MD   cc: Farrel Conners Venancio Poisson, NP   Onset Date/Surgical Date  02/09/18   02/09/18 prosthesis delivery,  05/06/18 CVA   Hand Dominance  Right      Transfers   Transfers  Sit to  Stand;Stand to Sit    Sit to Stand  5: Supervision;With upper extremity assist;With armrests;From chair/3-in-1    Stand to Sit  5: Supervision;With upper extremity assist;With armrests;To chair/3-in-1      Ambulation/Gait   Ambulation/Gait  Yes    Ambulation/Gait Assistance  4: Min assist;5: Supervision   MinA cane, Supervision RW   Ambulation Distance (Feet)  150 Feet   61' with cane, 150' X 2 RW   Assistive device  Prosthesis;Rolling walker;Straight cane    Gait Pattern  Step-through pattern;Decreased arm swing - left;Decreased step length - right;Decreased stance time - left;Decreased stride length;Left hip hike;Antalgic;Lateral hip instability;Trunk flexed;Abducted - left    Ambulation Surface  Indoor;Level    Gait velocity  1.76 ft/sec RW, 2.49 ft/sec cane min guard      Berg Balance Test   Sit to Stand  Able to stand  independently using hands    Standing Unsupported  Able to stand safely 2 minutes    Sitting with Back Unsupported but Feet Supported on Floor or Stool  Able to sit safely and securely 2 minutes    Stand to Sit  Controls descent by using hands    Transfers  Able to transfer safely, definite need of hands    Standing Unsupported with Eyes Closed  Able to stand 10 seconds with supervision    Standing Ubsupported with Feet Together  Needs help to attain position and unable to hold for 15 seconds    From Standing, Reach Forward with Outstretched Arm  Can reach forward >12 cm safely (5")    From Standing Position, Pick up Object from Floor  Able to pick up shoe, needs supervision    From Standing Position, Turn to Look Behind Over each Shoulder  Needs supervision when turning    Turn 360 Degrees  Needs close supervision or verbal cueing    Standing Unsupported, Alternately Place Feet on Step/Stool  Needs assistance to keep from falling or unable to try    Standing Unsupported, One Foot in [employer] balance while stepping or standing    Standing on One Leg  Tries to lift  leg/unable to hold 3 seconds but remains standing independently    Total Score  29                             PT Short Term Goals - 05/16/18 1758      PT SHORT TERM GOAL #1   Title  Patient verbalizes understanding of updated HEP & increasing activity level with recent CVA  (All updated STGs Target Date: 06/13/18)    Baseline  MET 04/18/2018    Time  4  Period  Weeks    Status  New    Target Date  06/13/18      PT SHORT TERM GOAL #2   Title  Patient ambulates 300' with cane & prosthesis with supervision.     Time  4    Status  Revised    Target Date  06/13/18      PT SHORT TERM GOAL #3   Title  Patient performs standing balance without UE support reaching 10" & picking up objects from floor without balance loss.     Time  4    Period  Weeks    Status  New    Target Date  06/13/18      PT SHORT TERM GOAL #4   Title  Patient negotiates ramps, curbs and stairs with 1 rail with cane & prosthesis with supervision.     Time  4    Period  Weeks    Status  Revised    Target Date  06/13/18        PT Long Term Goals - 05/16/18 1755      PT LONG TERM GOAL #1   Title  Patient demonstrates & verbalizes understanding of prosthetic care & wears prosthesis >90% of awake hours without skin or pain issues to enable safe use of prosthesis. (All LTGs Target Date: 08/10/2018)    Time  3    Period  Months    Status  On-going    Target Date  08/10/18      PT LONG TERM GOAL #2   Title  Patient demonstrates & verbalizes ongoing HEP / fitness plan with medical issues.     Time  3    Period  Months    Status  New    Target Date  08/10/18      PT LONG TERM GOAL #3   Title  Patient ambulates >1000' outdoors including grass, ramps & curbs with prosthesis only independently for community mobility.     Time  3    Period  Months    Status  On-going    Target Date  08/10/18      PT LONG TERM GOAL #4   Title  Berg Balance >45/56 to indicate lower fall risk.    Time   3    Period  Months    Status  On-going    Target Date  08/10/18      PT LONG TERM GOAL #5   Title  Patient perform floor transfers, lifts & carries 25# and push/pull to enable to return to yard work & other patient goals.     Time  3    Period  Months    Status  On-going    Target Date  08/10/18            Plan - 05/16/18 1800    Clinical Impression Statement  This 72yo male was progressing well with prosthetic rehab in that was ambulating with prosthesis only, riding his mountain bike and significantly improved balance. Patient had CVA on 05/06/2018 with weakness, balance deficits, gait abnormalities and impaired activity tolerance. His Berg Balance score of 29/56 is back to original level at evaluation. PT had not redone Berg prior to CVA but would probably have tested in upper 40's or lower 50's (perfect score of 56). Prior to CVA patient was ambulating with prosthesis only with ability to scan & negotiating obstacles. Now patient requires supervision with rolling walker & minimal assist (only 50') with cane.  He has increased gait abnormalities with high fall risk. Patient has low activity tolerance only tolerating <3 minutes of standing/gait before needs to sit to rest. Patient would benefit from further skilled physical therapy to improve function & safety.      History and Personal Factors relevant to plan of care:  CVA 05/06/2018, L TTA, DM, CKD3, PVD, arthritis, peripheral neuropathy    Clinical Presentation  Evolving    Clinical Presentation due to:  new onset CVA, high fall risk, weakness, impaired activity tolerance, hx of DM & neuropathy    Clinical Decision Making  Moderate    Rehab Potential  Good    PT Frequency  2x / week    PT Duration  12 weeks    PT Treatment/Interventions  ADLs/Self Care Home Management;Gait training;Stair training;Functional mobility training;Therapeutic activities;Therapeutic exercise;DME Instruction;Balance training;Neuromuscular  re-education;Patient/family education;Prosthetic Training;Vestibular;Manual techniques;Canalith Repostioning    PT Next Visit Plan  high level gait training outside over uneven surfaces, making curbs more fluid and automatic stepping down, carrying weighted objects over level surfaces and compliant; continue with high level balance activities including rocker board & foam beam to facilitate ankle, hip & step strategies & LLE SLS exercises, L hip extension    Consulted and Agree with Plan of Care  Patient       Patient will benefit from skilled therapeutic intervention in order to improve the following deficits and impairments:  Abnormal gait, Decreased activity tolerance, Decreased balance, Decreased endurance, Decreased knowledge of use of DME, Decreased mobility, Decreased strength, Dizziness, Impaired flexibility, Postural dysfunction, Prosthetic Dependency  Visit Diagnosis: Muscle weakness (generalized)  Other abnormalities of gait and mobility  Unsteadiness on feet  Abnormal posture  History of falling     Problem List Patient Active Problem List   Diagnosis Date Noted  . Stroke ([place])   . Symptomatic bradycardia 05/06/2018  . Postural dizziness with near syncope 05/06/2018  . Labile blood glucose   . Acute blood loss anemia   . Labile blood pressure   . Stage 3 chronic kidney disease ([place])   . Type 2 diabetes mellitus with peripheral neuropathy (HCC)   . Post-operative pain   . Amputation of left lower extremity below knee upon examination ([place]) 11/24/2017  . Amputated below knee, left ([place]) 11/22/2017  . Osteomyelitis of left foot ([place])   . Dehiscence of amputation stump ([place]) 08/11/2017  . S/P transmetatarsal amputation of foot, left ([place]) 07/19/2017  . Subacute osteomyelitis, left ankle and foot ([place]) 07/17/2017  . Diabetic ulcer of left foot associated with type 2 diabetes mellitus ([place])   . Diabetes mellitus with renal complications ([place]) 54/65/6812  . Diabetes  ([place]) 10/13/2014  . Anemia 10/13/2014  . CKD (chronic kidney disease) stage 3, GFR 30-59 ml/min (HCC) 10/13/2014  . Hyperkalemia 10/13/2014    Amena Dockham PT, DPT 05/17/2018, 8:02 AM  [facility] [address] [place], [place], [address] Phone: [phone]   Fax:  [phone]  Name: Kevin Bradford MRN: [id number] Date of Birth: 1945-12-13

## 2018-05-18 ENCOUNTER — Encounter: Payer: Self-pay | Admitting: Physical Therapy

## 2018-05-18 ENCOUNTER — Ambulatory Visit: Payer: Medicare Other | Attending: Orthopedic Surgery | Admitting: Physical Therapy

## 2018-05-18 DIAGNOSIS — R293 Abnormal posture: Secondary | ICD-10-CM | POA: Diagnosis not present

## 2018-05-18 DIAGNOSIS — Z9181 History of falling: Secondary | ICD-10-CM | POA: Diagnosis not present

## 2018-05-18 DIAGNOSIS — M6281 Muscle weakness (generalized): Secondary | ICD-10-CM | POA: Diagnosis not present

## 2018-05-18 DIAGNOSIS — R2681 Unsteadiness on feet: Secondary | ICD-10-CM | POA: Diagnosis not present

## 2018-05-18 DIAGNOSIS — R2689 Other abnormalities of gait and mobility: Secondary | ICD-10-CM | POA: Insufficient documentation

## 2018-05-20 NOTE — Therapy (Signed)
[facility] [address] [place], [place], [address] Phone: [phone]   Fax:  [phone]  Physical Therapy Treatment  Patient Details  Name: Kevin Bradford MRN: [id number] Date of Birth: [birth year] Referring Provider (PT): Meridee Score, MD (cc: Farrel Conners Venancio Poisson, NP)   Encounter Date: 05/18/2018     05/18/18 2123  PT Visits / Re-Eval  Visit Number 18 (recert sent 90WI visit so PN at 87)  Number of Visits 42  Date for PT Re-Evaluation 08/10/18  Authorization  Authorization Type Medicare & AETNA Medicare 80% AETNA covers 20% once $300 deductible (at eval $91.17 met)  Authorization Time Period recert sent 09BD visit so PN at 28  PT Time Calculation  PT Start Time 0850  PT Stop Time 0930  PT Time Calculation (min) 40 min  PT - End of Session  Equipment Utilized During Treatment Gait belt  Activity Tolerance Patient tolerated treatment well;Patient limited by fatigue  Behavior During Therapy [facility] for tasks assessed/performed    Past Medical History:  Diagnosis Date  . Arthritis    "joint pain" (08/16/2017)  . Cellulitis and abscess of leg 10/18/2014  . CKD (chronic kidney disease) stage 3, GFR 30-59 ml/min (HCC)    creatinine increases with antibiotics per pt, no known kidney disease per patient  . Dehiscence of closure of skin, subsequent encounter   . Diabetic foot ulcer ([place])    left  . Diabetic peripheral neuropathy ([place])   . GERD (gastroesophageal reflux disease)    12/26/2017- "not this year."   . Osteomyelitis ([place])    left transmetatarsal   . Stroke ([place])   . Type II diabetes mellitus ([place])     Past Surgical History:  Procedure Laterality Date  . AMPUTATION Left 07/19/2017   Procedure: LEFT TRANSMETATARSAL AMPUTATION;  Surgeon: Newt Minion, MD;  Location: [place];  Service: Orthopedics;  Laterality: Left;  . AMPUTATION Left 08/16/2017   REVISION LEFT TRANSMETATARSAL AMPUTATION  . AMPUTATION Left  11/22/2017   Procedure: LEFT BELOW KNEE AMPUTATION;  Surgeon: Newt Minion, MD;  Location: [place];  Service: Orthopedics;  Laterality: Left;  . CATARACT EXTRACTION W/ INTRAOCULAR LENS  IMPLANT, BILATERAL  ~ 2016  . EYE SURGERY Bilateral    catarct  . I&D EXTREMITY Left 10/17/2014   Procedure: IRRIGATION AND DEBRIDEMENT EXTREMITY LEFT FOOT;  Surgeon: Newt Minion, MD;  Location: [facility] [place];  Service: Orthopedics;  Laterality: Left;  . IR ANGIO INTRA EXTRACRAN SEL COM CAROTID INNOMINATE BILAT MOD SED  05/07/2018  . IR ANGIO VERTEBRAL SEL SUBCLAVIAN INNOMINATE BILAT MOD SED  05/07/2018  . STUMP REVISION Left 08/16/2017   Procedure: REVISION LEFT TRANSMETATARSAL AMPUTATION;  Surgeon: Newt Minion, MD;  Location: [place];  Service: Orthopedics;  Laterality: Left;  . STUMP REVISION Left 12/27/2017   Procedure: LEFT BELOW KNEE AMPUTATION REVISION;  Surgeon: Newt Minion, MD;  Location: [place];  Service: Orthopedics;  Laterality: Left;  . TOE AMPUTATION Right 2013   5th toe    There were no vitals filed for this visit.        05/18/18 0853  Symptoms/Limitations  Subjective Reports feeling "fuzzy" today. No headache today, however did have one on Wednesday night that lasted for about 20 minutes. He had climbed the stairs to go upstairs that night. Did not sleep well last night. Not sure if headache was symptom or due to weather changes that night. Took some ibuprofen and went to sleep. One episode of mild  headache since then. No falls.    Pertinent History L TTA, DM, CKD3, PVD, arthritis, peripheral neuropathy  Limitations Lifting;Standing;Walking;House hold activities  Patient Stated Goals To use prosthesis to be active fish, yard work, play with grandkids, ride bicycle. Swim in pool.   Pain Assessment  Currently in Pain? No/denies  Pain Score 0       05/18/18 2136  Ambulation/Gait  Ambulation/Gait Yes  Ambulation/Gait Assistance 5: Supervision  Ambulation/Gait Assistance Details slow gait  speed with cues needed on posture and heavy reliance on RW noted with gait.   Ambulation Distance (Feet) 80 Feet (x2)  Assistive device Rolling walker;Prosthesis  Gait Pattern Step-through pattern;Decreased arm swing - left;Decreased step length - right;Decreased stance time - left;Decreased stride length;Left hip hike;Antalgic;Lateral hip instability;Trunk flexed;Abducted - left  Ambulation Surface Level;Indoor  Self-Care  Self-Care Other Self-Care Comments  Other Self-Care Comments  education with handouts on stroke warning signs, risk factors, emotional changes after a stroke and stroke related fatigue. Both pt and spouse with many questions that were addressed with education provided. Did encourage them to seek medical assistance if a severe headache or any other warning signs occur again. We also discussed TIA's and how they present, then resolve with no symptoms. Both pt and spouse verbalized understanding of all education provided.                          Prosthetics  Current prosthetic wear tolerance (days/week)  daily  Current prosthetic wear tolerance (#hours/day)  most awake hours, drying every 3-4 hours  Residual limb condition  intact per pt report  Donning Prosthesis 5  Doffing Prosthesis 5        PT Short Term Goals - 05/16/18 1758      PT SHORT TERM GOAL #1   Title  Patient verbalizes understanding of updated HEP & increasing activity level with recent CVA  (All updated STGs Target Date: 06/13/18)    Baseline  MET 04/18/2018    Time  4    Period  Weeks    Status  New    Target Date  06/13/18      PT SHORT TERM GOAL #2   Title  Patient ambulates 300' with cane & prosthesis with supervision.     Time  4    Status  Revised    Target Date  06/13/18      PT SHORT TERM GOAL #3   Title  Patient performs standing balance without UE support reaching 10" & picking up objects from floor without balance loss.     Time  4    Period  Weeks    Status  New    Target Date   06/13/18      PT SHORT TERM GOAL #4   Title  Patient negotiates ramps, curbs and stairs with 1 rail with cane & prosthesis with supervision.     Time  4    Period  Weeks    Status  Revised    Target Date  06/13/18        PT Long Term Goals - 05/16/18 1755      PT LONG TERM GOAL #1   Title  Patient demonstrates & verbalizes understanding of prosthetic care & wears prosthesis >90% of awake hours without skin or pain issues to enable safe use of prosthesis. (All LTGs Target Date: 08/10/2018)    Time  3    Period  Months    Status  On-going    Target Date  08/10/18      PT LONG TERM GOAL #2   Title  Patient demonstrates & verbalizes ongoing HEP / fitness plan with medical issues.     Time  3    Period  Months    Status  New    Target Date  08/10/18      PT LONG TERM GOAL #3   Title  Patient ambulates >1000' outdoors including grass, ramps & curbs with prosthesis only independently for community mobility.     Time  3    Period  Months    Status  On-going    Target Date  08/10/18      PT LONG TERM GOAL #4   Title  Berg Balance >45/56 to indicate lower fall risk.    Time  3    Period  Months    Status  On-going    Target Date  08/10/18      PT LONG TERM GOAL #5   Title  Patient perform floor transfers, lifts & carries 25# and push/pull to enable to return to yard work & other patient goals.     Time  3    Period  Months    Status  On-going    Target Date  08/10/18         05/18/18 2123  Plan  Clinical Impression Statement Today's skilled session focused on CVA education with emphasis on symptoms of stroke, risk factors for stroke and stroke related fatigue after pt reported to therapy with increased fatigue from assisting spouse with navigating an alternative route to PT today, Provided both pt and spouse with printed resources from [employer] containing all information discussed with today's session. We also touched on emotional changes that can occur  after a stroke when the pt reported feeling more angry/frustrated with family situations than he would normally get. Both spouse and pt reported feeling appreciative of edcuation provided today as all information given at hospital is a blur. The pt is progressing toward goals and should benefit from continued PT to progress toward unmet goals.                    Pt will benefit from skilled therapeutic intervention in order to improve on the following deficits Abnormal gait;Decreased activity tolerance;Decreased balance;Decreased endurance;Decreased knowledge of use of DME;Decreased mobility;Decreased strength;Dizziness;Impaired flexibility;Postural dysfunction;Prosthetic Dependency  Rehab Potential Good  PT Frequency 2x / week  PT Duration 12 weeks  PT Treatment/Interventions ADLs/Self Care Home Management;Gait training;Stair training;Functional mobility training;Therapeutic activities;Therapeutic exercise;DME Instruction;Balance training;Neuromuscular re-education;Patient/family education;Prosthetic Training;Vestibular;Manual techniques;Canalith Repostioning  PT Next Visit Plan work on activity tolerance, gait with decreased UE support as able working toward cane again, balance reacitons with decreased UE support.  Consulted and Agree with Plan of Care Patient          Patient will benefit from skilled therapeutic intervention in order to improve the following deficits and impairments:  Abnormal gait, Decreased activity tolerance, Decreased balance, Decreased endurance, Decreased knowledge of use of DME, Decreased mobility, Decreased strength, Dizziness, Impaired flexibility, Postural dysfunction, Prosthetic Dependency  Visit Diagnosis: Muscle weakness (generalized)     Problem List Patient Active Problem List   Diagnosis Date Noted  . Stroke ([place])   . Symptomatic bradycardia 05/06/2018  . Postural dizziness with near syncope 05/06/2018  . Labile blood glucose   . Acute blood loss  anemia   . Labile blood pressure   . Stage 3 chronic  kidney disease ([place])   . Type 2 diabetes mellitus with peripheral neuropathy (HCC)   . Post-operative pain   . Amputation of left lower extremity below knee upon examination ([place]) 11/24/2017  . Amputated below knee, left ([place]) 11/22/2017  . Osteomyelitis of left foot ([place])   . Dehiscence of amputation stump ([place]) 08/11/2017  . S/P transmetatarsal amputation of foot, left ([place]) 07/19/2017  . Subacute osteomyelitis, left ankle and foot ([place]) 07/17/2017  . Diabetic ulcer of left foot associated with type 2 diabetes mellitus ([place])   . Diabetes mellitus with renal complications ([place]) 95/03/3266  . Diabetes ([place]) 10/13/2014  . Anemia 10/13/2014  . CKD (chronic kidney disease) stage 3, GFR 30-59 ml/min (HCC) 10/13/2014  . Hyperkalemia 10/13/2014    Willow Ora, PTA, [facility] [address] [place], [place] [address] [phone] 05/20/18, 9:43 PM   Name: Kevin Bradford MRN: [id number] Date of Birth: 1945/11/26

## 2018-05-21 ENCOUNTER — Ambulatory Visit: Payer: Medicare Other | Admitting: Physical Therapy

## 2018-05-21 ENCOUNTER — Encounter: Payer: Self-pay | Admitting: Physical Therapy

## 2018-05-21 DIAGNOSIS — R2689 Other abnormalities of gait and mobility: Secondary | ICD-10-CM

## 2018-05-21 DIAGNOSIS — R2681 Unsteadiness on feet: Secondary | ICD-10-CM

## 2018-05-21 DIAGNOSIS — R293 Abnormal posture: Secondary | ICD-10-CM | POA: Diagnosis not present

## 2018-05-21 DIAGNOSIS — Z9181 History of falling: Secondary | ICD-10-CM | POA: Diagnosis not present

## 2018-05-21 DIAGNOSIS — M6281 Muscle weakness (generalized): Secondary | ICD-10-CM

## 2018-05-21 NOTE — Therapy (Signed)
[facility] [address] [place], [place], [address] Phone: [phone]   Fax:  [phone]  Physical Therapy Treatment  Patient Details  Name: Kevin Bradford MRN: [id number] Date of Birth: 11/28/1945 Referring Provider (PT): Meridee Score, MD (cc: [facility] Venancio Poisson, NP)   Encounter Date: 05/21/2018  PT End of Session - 05/21/18 0930    Visit Number  20   recert sent 78HY visit so PN at 28   Number of Visits  42    Date for PT Re-Evaluation  08/10/18    Authorization Type  Medicare & AETNA Medicare 80% AETNA covers 20% once $300 deductible (at eval $91.17 met)    Authorization Time Period  recert sent 85OY visit so PN at 28    PT Start Time  0847    PT Stop Time  0930    PT Time Calculation (min)  43 min    Equipment Utilized During Treatment  Gait belt    Activity Tolerance  Patient tolerated treatment well;Patient limited by fatigue    Behavior During Therapy  [facility] for tasks assessed/performed       Past Medical History:  Diagnosis Date  . Arthritis    "joint pain" (08/16/2017)  . Cellulitis and abscess of leg 10/18/2014  . CKD (chronic kidney disease) stage 3, GFR 30-59 ml/min (HCC)    creatinine increases with antibiotics per pt, no known kidney disease per patient  . Dehiscence of closure of skin, subsequent encounter   . Diabetic foot ulcer ([place])    left  . Diabetic peripheral neuropathy ([place])   . GERD (gastroesophageal reflux disease)    12/26/2017- "not this year."   . Osteomyelitis ([place])    left transmetatarsal   . Stroke ([place])   . Type II diabetes mellitus ([place])     Past Surgical History:  Procedure Laterality Date  . AMPUTATION Left 07/19/2017   Procedure: LEFT TRANSMETATARSAL AMPUTATION;  Surgeon: Newt Minion, MD;  Location: [facility];  Service: Orthopedics;  Laterality: Left;  . AMPUTATION Left 08/16/2017   REVISION LEFT TRANSMETATARSAL AMPUTATION  . AMPUTATION Left 11/22/2017   Procedure: LEFT  BELOW KNEE AMPUTATION;  Surgeon: Newt Minion, MD;  Location: [facility] [place];  Service: Orthopedics;  Laterality: Left;  . CATARACT EXTRACTION W/ INTRAOCULAR LENS  IMPLANT, BILATERAL  ~ 2016  . EYE SURGERY Bilateral    catarct  . I&D EXTREMITY Left 10/17/2014   Procedure: IRRIGATION AND DEBRIDEMENT EXTREMITY LEFT FOOT;  Surgeon: Newt Minion, MD;  Location: [place];  Service: Orthopedics;  Laterality: Left;  . IR ANGIO INTRA EXTRACRAN SEL COM CAROTID INNOMINATE BILAT MOD SED  05/07/2018  . IR ANGIO VERTEBRAL SEL SUBCLAVIAN INNOMINATE BILAT MOD SED  05/07/2018  . STUMP REVISION Left 08/16/2017   Procedure: REVISION LEFT TRANSMETATARSAL AMPUTATION;  Surgeon: Newt Minion, MD;  Location: [place];  Service: Orthopedics;  Laterality: Left;  . STUMP REVISION Left 12/27/2017   Procedure: LEFT BELOW KNEE AMPUTATION REVISION;  Surgeon: Newt Minion, MD;  Location: [facility];  Service: Orthopedics;  Laterality: Left;  . TOE AMPUTATION Right 2013   5th toe    There were no vitals filed for this visit.  Subjective Assessment - 05/21/18 0845    Subjective  He is less tired now. He is sleeping upstairs nightly. He has returned to negotiating stairs with less difficulty.     Pertinent History  L TTA, DM, CKD3, PVD, arthritis, peripheral neuropathy    Limitations  Lifting;Standing;Walking;House  hold activities    Patient Stated Goals  To use prosthesis to be active fish, yard work, play with grandkids, ride bicycle. Swim in pool.     Currently in Pain?  No/denies                       [facility] Adult PT Treatment/Exercise - 05/21/18 0845      Ambulation/Gait   Ambulation/Gait  Yes    Ambulation/Gait Assistance  4: Min guard;5: Supervision    Ambulation Distance (Feet)  80 Feet   80' X 3   Assistive device  Straight cane;Prosthesis    Ambulation Surface  Indoor;Level      Knee/Hip Exercises: Aerobic   Stepper  SciFit recumbent stepper with BUEs & BLEs 7 min total (2 min level 2 and 5 min level 3)    HR 84 after exercise         Balance Exercises - 05/21/18 0845      Balance Exercises: Standing   Stepping Strategy  Anterior;Posterior;Foam/compliant surface;5 reps;UE support   needs UE support for stepping motion, holds 5 sec step posit   Rockerboard  Anterior/posterior;Lateral;EO;Head turns;EC;10 seconds;Other (comment)   weight shifts with stabilization & recovery   Balance Beam  standing crossways with eyes open head turns right/left, up/down & diagonals           PT Short Term Goals - 05/16/18 1758      PT SHORT TERM GOAL #1   Title  Patient verbalizes understanding of updated HEP & increasing activity level with recent CVA  (All updated STGs Target Date: 06/13/18)    Baseline  MET 04/18/2018    Time  4    Period  Weeks    Status  New    Target Date  06/13/18      PT SHORT TERM GOAL #2   Title  Patient ambulates 300' with cane & prosthesis with supervision.     Time  4    Status  Revised    Target Date  06/13/18      PT SHORT TERM GOAL #3   Title  Patient performs standing balance without UE support reaching 10" & picking up objects from floor without balance loss.     Time  4    Period  Weeks    Status  New    Target Date  06/13/18      PT SHORT TERM GOAL #4   Title  Patient negotiates ramps, curbs and stairs with 1 rail with cane & prosthesis with supervision.     Time  4    Period  Weeks    Status  Revised    Target Date  06/13/18        PT Long Term Goals - 05/16/18 1755      PT LONG TERM GOAL #1   Title  Patient demonstrates & verbalizes understanding of prosthetic care & wears prosthesis >90% of awake hours without skin or pain issues to enable safe use of prosthesis. (All LTGs Target Date: 08/10/2018)    Time  3    Period  Months    Status  On-going    Target Date  08/10/18      PT LONG TERM GOAL #2   Title  Patient demonstrates & verbalizes ongoing HEP / fitness plan with medical issues.     Time  3    Period  Months    Status  New     Target Date  08/10/18      PT LONG TERM GOAL #3   Title  Patient ambulates >1000' outdoors including grass, ramps & curbs with prosthesis only independently for community mobility.     Time  3    Period  Months    Status  On-going    Target Date  08/10/18      PT LONG TERM GOAL #4   Title  Berg Balance >45/56 to indicate lower fall risk.    Time  3    Period  Months    Status  On-going    Target Date  08/10/18      PT LONG TERM GOAL #5   Title  Patient perform floor transfers, lifts & carries 25# and push/pull to enable to return to yard work & other patient goals.     Time  3    Period  Months    Status  On-going    Target Date  08/10/18            Plan - 05/21/18 1500    Clinical Impression Statement  Patient appeared to have more energy with improved activity tolerance today but still needed rest breaks between each activity. Patient's balance reactions are slowed with CVA deficits. PT advised patient to not resume weight lifting & exercising at home yet and seems to understand.     Rehab Potential  Good    PT Frequency  2x / week    PT Duration  12 weeks    PT Treatment/Interventions  ADLs/Self Care Home Management;Gait training;Stair training;Functional mobility training;Therapeutic activities;Therapeutic exercise;DME Instruction;Balance training;Neuromuscular re-education;Patient/family education;Prosthetic Training;Vestibular;Manual techniques;Canalith Repostioning    PT Next Visit Plan  prosthetic gait with cane & balance activities.     Consulted and Agree with Plan of Care  Patient;Family member/caregiver    Family Member Consulted  wife, Kevin Bradford       Patient will benefit from skilled therapeutic intervention in order to improve the following deficits and impairments:  Abnormal gait, Decreased activity tolerance, Decreased balance, Decreased endurance, Decreased knowledge of use of DME, Decreased mobility, Decreased strength, Dizziness, Impaired  flexibility, Postural dysfunction, Prosthetic Dependency  Visit Diagnosis: Muscle weakness (generalized)  Other abnormalities of gait and mobility  Unsteadiness on feet  Abnormal posture     Problem List Patient Active Problem List   Diagnosis Date Noted  . Stroke ([place])   . Symptomatic bradycardia 05/06/2018  . Postural dizziness with near syncope 05/06/2018  . Labile blood glucose   . Acute blood loss anemia   . Labile blood pressure   . Stage 3 chronic kidney disease ([place])   . Type 2 diabetes mellitus with peripheral neuropathy (HCC)   . Post-operative pain   . Amputation of left lower extremity below knee upon examination ([place]) 11/24/2017  . Amputated below knee, left ([place]) 11/22/2017  . Osteomyelitis of left foot ([place])   . Dehiscence of amputation stump ([place]) 08/11/2017  . S/P transmetatarsal amputation of foot, left ([place]) 07/19/2017  . Subacute osteomyelitis, left ankle and foot ([place]) 07/17/2017  . Diabetic ulcer of left foot associated with type 2 diabetes mellitus ([place])   . Diabetes mellitus with renal complications ([place]) 32/67/1245  . Diabetes ([place]) 10/13/2014  . Anemia 10/13/2014  . CKD (chronic kidney disease) stage 3, GFR 30-59 ml/min (HCC) 10/13/2014  . Hyperkalemia 10/13/2014    Kevin Bradford PT, DPT 05/21/2018, 3:03 PM  [facility] [address] [place], [place], [address] Phone: [phone]  Fax:  [phone]  Name: Kevin Bradford MRN: [id number] Date of Birth: 07-04-1946

## 2018-05-22 ENCOUNTER — Other Ambulatory Visit: Payer: Self-pay | Admitting: *Deleted

## 2018-05-22 DIAGNOSIS — E7800 Pure hypercholesterolemia, unspecified: Secondary | ICD-10-CM | POA: Diagnosis not present

## 2018-05-22 DIAGNOSIS — I639 Cerebral infarction, unspecified: Secondary | ICD-10-CM | POA: Diagnosis not present

## 2018-05-22 DIAGNOSIS — Z794 Long term (current) use of insulin: Secondary | ICD-10-CM | POA: Diagnosis not present

## 2018-05-22 DIAGNOSIS — E1122 Type 2 diabetes mellitus with diabetic chronic kidney disease: Secondary | ICD-10-CM | POA: Diagnosis not present

## 2018-05-22 NOTE — Patient Outreach (Signed)
[facility]) Care Management  05/22/2018  Kevin Bradford 1945/08/12 [id number]   EMMI- stroke    RED ON EMMI ALERT Day # 13 Date: 05/21/18 Monday 1000 Red Alert Reason: went to follow up appointment? no Insurance medicare   Patient returned a call to [facility] RN CM Patient is able to verify HIPAA Reviewed and addressed referral to [facility] with patient  Social: Kevin Bradford lives at home with his wife He denies any concerns with transportation to appointments. He is independent/assist with all care  Conditions: stroke, postural dizziness with near syncope, DM type 2, left ankle osteomyelitis, CKD stage 3, anemia, hyperkalemia  Medications: denies concerns with taking medications as prescribed, affording medications, side effects of medications and questions about medications  Appointments: Kevin Bradford confirms he had an appointment with her primary MD this am, will continue going to neuro rehab and to see the neurologist on 05/30/18  DME cane prosthesis, glucose monitor  Advance Directives: Consent: [facility] RN CM reviewed [facility] services with patient. Patient gave verbal consent for services.  Plans [facility] RN CM will close case at this time as patient has been assessed and no needs identified.   [facility] RN CM sent a successful outreach letter as discussed with [facility] brochure enclosed for review  Pt encouraged to return a call to [facility] RN CM prn  Kevin Bradford L. Noelle Penner, RN, BSN, CCM [facility] Telephonic Care Management Care Coordinator Office number [phone] Mobile number [phone]  Main [facility] number [phone] Fax number [phone]

## 2018-05-22 NOTE — Patient Outreach (Signed)
[facility]) Care Management  05/22/2018  Is[place] Syler September 20, 1945 [id number]   EMMI- stroke    RED ON EMMI ALERT Day # 13 Date: 05/21/18 Monday 1000 Red Alert Reason: went to follow up appointment? no [employer] attempt # 1 No answer. [facility] RN CM left HIPAA compliant voicemail message along with CM's contact info.   Plan: [facility] RN CM sent an unsuccessful outreach letter and scheduled this patient for another call attempt within 4 business days  Keliah Harned L. Noelle Penner, RN, BSN, CCM [facility] Telephonic Care Management Care Coordinator Office number [phone] Mobile number [phone]  Main [facility] number [phone] Fax number [phone]

## 2018-05-23 ENCOUNTER — Encounter: Payer: Self-pay | Admitting: Physical Therapy

## 2018-05-23 ENCOUNTER — Ambulatory Visit: Payer: PRIVATE HEALTH INSURANCE | Admitting: *Deleted

## 2018-05-23 ENCOUNTER — Ambulatory Visit: Payer: Medicare Other | Admitting: Physical Therapy

## 2018-05-23 DIAGNOSIS — M6281 Muscle weakness (generalized): Secondary | ICD-10-CM | POA: Diagnosis not present

## 2018-05-23 DIAGNOSIS — R2681 Unsteadiness on feet: Secondary | ICD-10-CM | POA: Diagnosis not present

## 2018-05-23 DIAGNOSIS — R293 Abnormal posture: Secondary | ICD-10-CM | POA: Diagnosis not present

## 2018-05-23 DIAGNOSIS — R2689 Other abnormalities of gait and mobility: Secondary | ICD-10-CM | POA: Diagnosis not present

## 2018-05-23 DIAGNOSIS — Z9181 History of falling: Secondary | ICD-10-CM | POA: Diagnosis not present

## 2018-05-23 NOTE — Therapy (Signed)
[facility] [address] [place], [place], [address] Phone: [phone]   Fax:  [phone]  Physical Therapy Treatment  Patient Details  Name: Kevin Bradford MRN: [id number] Date of Birth: 09/17/1945 Referring Provider (PT): Meridee Score, MD (cc: GNA Venancio Poisson, NP)   Encounter Date: 05/23/2018  PT End of Session - 05/23/18 0950    Visit Number  21   recert sent 69GE visit so PN at 66   Number of Visits  42    Date for PT Re-Evaluation  08/10/18    Authorization Type  Medicare & AETNA Medicare 80% AETNA covers 20% once $300 deductible (at eval $91.17 met)    Authorization Time Period  recert sent 95MW visit so PN at 28    PT Start Time  0850    PT Stop Time  0930    PT Time Calculation (min)  40 min    Equipment Utilized During Treatment  Gait belt    Activity Tolerance  Patient tolerated treatment well;Patient limited by fatigue    Behavior During Therapy  [facility] for tasks assessed/performed       Past Medical History:  Diagnosis Date  . Arthritis    "joint pain" (08/16/2017)  . Cellulitis and abscess of leg 10/18/2014  . CKD (chronic kidney disease) stage 3, GFR 30-59 ml/min (HCC)    creatinine increases with antibiotics per pt, no known kidney disease per patient  . Dehiscence of closure of skin, subsequent encounter   . Diabetic foot ulcer ([place])    left  . Diabetic peripheral neuropathy ([place])   . GERD (gastroesophageal reflux disease)    12/26/2017- "not this year."   . Osteomyelitis ([place])    left transmetatarsal   . Stroke ([place])   . Type II diabetes mellitus ([place])     Past Surgical History:  Procedure Laterality Date  . AMPUTATION Left 07/19/2017   Procedure: LEFT TRANSMETATARSAL AMPUTATION;  Surgeon: Newt Minion, MD;  Location: [facility];  Service: Orthopedics;  Laterality: Left;  . AMPUTATION Left 08/16/2017   REVISION LEFT TRANSMETATARSAL AMPUTATION  . AMPUTATION Left 11/22/2017   Procedure: LEFT  BELOW KNEE AMPUTATION;  Surgeon: Newt Minion, MD;  Location: [facility];  Service: Orthopedics;  Laterality: Left;  . CATARACT EXTRACTION W/ INTRAOCULAR LENS  IMPLANT, BILATERAL  ~ 2016  . EYE SURGERY Bilateral    catarct  . I&D EXTREMITY Left 10/17/2014   Procedure: IRRIGATION AND DEBRIDEMENT EXTREMITY LEFT FOOT;  Surgeon: Newt Minion, MD;  Location: [facility];  Service: Orthopedics;  Laterality: Left;  . IR ANGIO INTRA EXTRACRAN SEL COM CAROTID INNOMINATE BILAT MOD SED  05/07/2018  . IR ANGIO VERTEBRAL SEL SUBCLAVIAN INNOMINATE BILAT MOD SED  05/07/2018  . STUMP REVISION Left 08/16/2017   Procedure: REVISION LEFT TRANSMETATARSAL AMPUTATION;  Surgeon: Newt Minion, MD;  Location: [facility];  Service: Orthopedics;  Laterality: Left;  . STUMP REVISION Left 12/27/2017   Procedure: LEFT BELOW KNEE AMPUTATION REVISION;  Surgeon: Newt Minion, MD;  Location: [facility];  Service: Orthopedics;  Laterality: Left;  . TOE AMPUTATION Right 2013   5th toe    There were no vitals filed for this visit.  Subjective Assessment - 05/23/18 0856    Subjective  Pt states that he went to the doctor yesterday and has started taking Jardiance once a day for a month trial basis for his diabetes, decreasing his insulin to 4. Pt did not know exact dose and will bring next  visit. Also states that he might get a glucose monitoring patch. His energy today is at a 7 or 8/10. Pt also states that he may go see his prosthetist later this week or early next week to see if it needs realigment because "it doesn't feel like it used to"   Patient is accompained by:  Family member   wife   Pertinent History  L TTA, DM, CKD3, PVD, arthritis, peripheral neuropathy    Limitations  Lifting;Standing;Walking;House hold activities    Patient Stated Goals  To use prosthesis to be active fish, yard work, play with grandkids, ride bicycle. Swim in pool.     Currently in Pain?  No/denies                       [facility] Adult PT  Treatment/Exercise - 05/23/18 0934      Ambulation/Gait   Ambulation/Gait  Yes    Ambulation/Gait Assistance  5: Supervision    Ambulation/Gait Assistance Details  Verbal cues to increase weight bearing on left and to increase step length on right.     Ambulation Distance (Feet)  230 Feet   134f x 2   Assistive device  Straight cane;Prosthesis    Gait Pattern  Step-through pattern;Decreased step length - right;Decreased stance time - left;Decreased stride length;Decreased weight shift to left    Ambulation Surface  Level;Indoor      High Level Balance   High Level Balance Comments  In parallel bars: pt performs foward and backward stepping over small orange hurdles x4 laps on floor; foward and lateral stepping over small black foam x3 laps on blue mat; foward and lateral toe taps x 10 each. Pt requires min assist to maintain balance with stepping exercises on mat and cone touches, and supervision with all other exercises. Verbal cues to increase step length for safety, to lighten UE support, and to step over obstacles with prosthesis first to ensure clearance.          PT Short Term Goals - 05/16/18 1758      PT SHORT TERM GOAL #1   Title  Patient verbalizes understanding of updated HEP & increasing activity level with recent CVA  (All updated STGs Target Date: 06/13/18)    Baseline  MET 04/18/2018    Time  4    Period  Weeks    Status  New    Target Date  06/13/18      PT SHORT TERM GOAL #2   Title  Patient ambulates 300' with cane & prosthesis with supervision.     Time  4    Status  Revised    Target Date  06/13/18      PT SHORT TERM GOAL #3   Title  Patient performs standing balance without UE support reaching 10" & picking up objects from floor without balance loss.     Time  4    Period  Weeks    Status  New    Target Date  06/13/18      PT SHORT TERM GOAL #4   Title  Patient negotiates ramps, curbs and stairs with 1 rail with cane & prosthesis with supervision.      Time  4    Period  Weeks    Status  Revised    Target Date  06/13/18        PT Long Term Goals - 05/16/18 1755      PT LONG TERM GOAL #1  Title  Patient demonstrates & verbalizes understanding of prosthetic care & wears prosthesis >90% of awake hours without skin or pain issues to enable safe use of prosthesis. (All LTGs Target Date: 08/10/2018)    Time  3    Period  Months    Status  On-going    Target Date  08/10/18      PT LONG TERM GOAL #2   Title  Patient demonstrates & verbalizes ongoing HEP / fitness plan with medical issues.     Time  3    Period  Months    Status  New    Target Date  08/10/18      PT LONG TERM GOAL #3   Title  Patient ambulates >1000' outdoors including grass, ramps & curbs with prosthesis only independently for community mobility.     Time  3    Period  Months    Status  On-going    Target Date  08/10/18      PT LONG TERM GOAL #4   Title  Berg Balance >45/56 to indicate lower fall risk.    Time  3    Period  Months    Status  On-going    Target Date  08/10/18      PT LONG TERM GOAL #5   Title  Patient perform floor transfers, lifts & carries 25# and push/pull to enable to return to yard work & other patient goals.     Time  3    Period  Months    Status  On-going    Target Date  08/10/18            Plan - 05/23/18 0951    Clinical Impression Statement  Today's session focused on gait with straight cane and dynamic balance exercises emphasizing negotiating over obstacles on complaint surface and single leg exercises. Pt seemed energized today and was able to tolerate advanced dynamic exercises without complications. Pt required cueing to increase step length on right and increase weight bearing on left during ambulation with supervision. Pt requires up to min assist with high level balance and UE assist.       Rehab Potential  Good    PT Frequency  2x / week    PT Duration  12 weeks    PT Treatment/Interventions  ADLs/Self Care Home  Management;Gait training;Stair training;Functional mobility training;Therapeutic activities;Therapeutic exercise;DME Instruction;Balance training;Neuromuscular re-education;Patient/family education;Prosthetic Training;Vestibular;Manual techniques;Canalith Repostioning    PT Next Visit Plan  prosthetic gait with cane & balance activities.     Consulted and Agree with Plan of Care  Patient;Family member/caregiver    Family Member Consulted  wife, Adron Bene       Patient will benefit from skilled therapeutic intervention in order to improve the following deficits and impairments:  Abnormal gait, Decreased activity tolerance, Decreased balance, Decreased endurance, Decreased knowledge of use of DME, Decreased mobility, Decreased strength, Dizziness, Impaired flexibility, Postural dysfunction, Prosthetic Dependency  Visit Diagnosis: Muscle weakness (generalized)  Other abnormalities of gait and mobility     Problem List Patient Active Problem List   Diagnosis Date Noted  . Stroke ([place])   . Symptomatic bradycardia 05/06/2018  . Postural dizziness with near syncope 05/06/2018  . Labile blood glucose   . Acute blood loss anemia   . Labile blood pressure   . Stage 3 chronic kidney disease ([place])   . Type 2 diabetes mellitus with peripheral neuropathy (HCC)   . Post-operative pain   . Amputation of left lower  extremity below knee upon examination ([place]) 11/24/2017  . Amputated below knee, left ([place]) 11/22/2017  . Osteomyelitis of left foot ([place])   . Dehiscence of amputation stump ([place]) 08/11/2017  . S/P transmetatarsal amputation of foot, left ([place]) 07/19/2017  . Subacute osteomyelitis, left ankle and foot ([place]) 07/17/2017  . Diabetic ulcer of left foot associated with type 2 diabetes mellitus ([place])   . Diabetes mellitus with renal complications ([place]) 01/56/1537  . Diabetes ([place]) 10/13/2014  . Anemia 10/13/2014  . CKD (chronic kidney disease) stage 3, GFR 30-59 ml/min (HCC)  10/13/2014  . Hyperkalemia 10/13/2014    Cecile Sheerer, SPTA 05/23/2018, 10:08 AM  [facility] [address] [place], [place], [address] Phone: [phone]   Fax:  [phone]  Name: Keyion Knack MRN: [id number] Date of Birth: 1946/05/13

## 2018-05-27 DIAGNOSIS — E11621 Type 2 diabetes mellitus with foot ulcer: Secondary | ICD-10-CM | POA: Diagnosis not present

## 2018-05-27 DIAGNOSIS — Z794 Long term (current) use of insulin: Secondary | ICD-10-CM | POA: Diagnosis not present

## 2018-05-27 DIAGNOSIS — N183 Chronic kidney disease, stage 3 (moderate): Secondary | ICD-10-CM | POA: Diagnosis not present

## 2018-05-27 DIAGNOSIS — E1122 Type 2 diabetes mellitus with diabetic chronic kidney disease: Secondary | ICD-10-CM | POA: Diagnosis not present

## 2018-05-27 DIAGNOSIS — E1140 Type 2 diabetes mellitus with diabetic neuropathy, unspecified: Secondary | ICD-10-CM | POA: Diagnosis not present

## 2018-05-27 DIAGNOSIS — I639 Cerebral infarction, unspecified: Secondary | ICD-10-CM | POA: Diagnosis not present

## 2018-05-28 ENCOUNTER — Encounter (INDEPENDENT_AMBULATORY_CARE_PROVIDER_SITE_OTHER): Payer: Self-pay | Admitting: Orthopedic Surgery

## 2018-05-28 ENCOUNTER — Ambulatory Visit (INDEPENDENT_AMBULATORY_CARE_PROVIDER_SITE_OTHER): Payer: Medicare Other | Admitting: Orthopedic Surgery

## 2018-05-28 ENCOUNTER — Encounter: Payer: Self-pay | Admitting: Physical Therapy

## 2018-05-28 ENCOUNTER — Ambulatory Visit: Payer: Medicare Other | Admitting: Physical Therapy

## 2018-05-28 VITALS — Ht 74.0 in | Wt 220.0 lb

## 2018-05-28 DIAGNOSIS — R2689 Other abnormalities of gait and mobility: Secondary | ICD-10-CM | POA: Diagnosis not present

## 2018-05-28 DIAGNOSIS — I639 Cerebral infarction, unspecified: Secondary | ICD-10-CM

## 2018-05-28 DIAGNOSIS — Z9181 History of falling: Secondary | ICD-10-CM | POA: Diagnosis not present

## 2018-05-28 DIAGNOSIS — R2681 Unsteadiness on feet: Secondary | ICD-10-CM | POA: Diagnosis not present

## 2018-05-28 DIAGNOSIS — Z89512 Acquired absence of left leg below knee: Secondary | ICD-10-CM | POA: Diagnosis not present

## 2018-05-28 DIAGNOSIS — R293 Abnormal posture: Secondary | ICD-10-CM | POA: Diagnosis not present

## 2018-05-28 DIAGNOSIS — M6281 Muscle weakness (generalized): Secondary | ICD-10-CM | POA: Diagnosis not present

## 2018-05-28 NOTE — Patient Instructions (Signed)
Stationary bike: ride with low to medium intensity / comfortable pace. Do NOT do any "climbs" or "sprints" for now.  Start with 5 min work/ride, 3 min rest up to 4 sets. Progress to 6 min ride, 3 min rest, 4 sets then reduce rest to 2 minutes Progress to 7 min ride, 2 min rest 4 sets;  8 min ride 2 min rest adding 1 min to ride up to 15 min ride 2 min rest. Then reduce rest to 1 minute Then 30 minutes straight.  This progression may take weeks to get to 30 minutes.

## 2018-05-28 NOTE — Therapy (Signed)
[facility] [address] [place], [place], [address] Phone: [phone]   Fax:  [phone]  Physical Therapy Treatment  Patient Details  Name: Kevin Bradford MRN: [id number] Date of Birth: [birth year] Referring Provider (PT): Meridee Score, MD (cc: GNA Venancio Poisson, NP)   Encounter Date: 05/28/2018  PT End of Session - 05/28/18 1342    Visit Number  21   recert sent 46EV visit so PN at 61   Number of Visits  42    Date for PT Re-Evaluation  08/10/18    Authorization Type  Medicare & AETNA Medicare 80% AETNA covers 20% once $300 deductible (at eval $91.17 met)    Authorization Time Period  recert sent 03JK visit so PN at 28    PT Start Time  0847    PT Stop Time  0930    PT Time Calculation (min)  43 min    Equipment Utilized During Treatment  Gait belt    Activity Tolerance  Patient tolerated treatment well;Patient limited by fatigue    Behavior During Therapy  [facility] for tasks assessed/performed       Past Medical History:  Diagnosis Date  . Arthritis    "joint pain" (08/16/2017)  . Cellulitis and abscess of leg 10/18/2014  . CKD (chronic kidney disease) stage 3, GFR 30-59 ml/min (HCC)    creatinine increases with antibiotics per pt, no known kidney disease per patient  . Dehiscence of closure of skin, subsequent encounter   . Diabetic foot ulcer ([place])    left  . Diabetic peripheral neuropathy ([place])   . GERD (gastroesophageal reflux disease)    12/26/2017- "not this year."   . Osteomyelitis ([place])    left transmetatarsal   . Stroke ([place])   . Type II diabetes mellitus ([place])     Past Surgical History:  Procedure Laterality Date  . AMPUTATION Left 07/19/2017   Procedure: LEFT TRANSMETATARSAL AMPUTATION;  Surgeon: Newt Minion, MD;  Location: [facility];  Service: Orthopedics;  Laterality: Left;  . AMPUTATION Left 08/16/2017   REVISION LEFT TRANSMETATARSAL AMPUTATION  . AMPUTATION Left 11/22/2017   Procedure: LEFT  BELOW KNEE AMPUTATION;  Surgeon: Newt Minion, MD;  Location: [facility];  Service: Orthopedics;  Laterality: Left;  . CATARACT EXTRACTION W/ INTRAOCULAR LENS  IMPLANT, BILATERAL  ~ 2016  . EYE SURGERY Bilateral    catarct  . I&D EXTREMITY Left 10/17/2014   Procedure: IRRIGATION AND DEBRIDEMENT EXTREMITY LEFT FOOT;  Surgeon: Newt Minion, MD;  Location: [facility];  Service: Orthopedics;  Laterality: Left;  . IR ANGIO INTRA EXTRACRAN SEL COM CAROTID INNOMINATE BILAT MOD SED  05/07/2018  . IR ANGIO VERTEBRAL SEL SUBCLAVIAN INNOMINATE BILAT MOD SED  05/07/2018  . STUMP REVISION Left 08/16/2017   Procedure: REVISION LEFT TRANSMETATARSAL AMPUTATION;  Surgeon: Newt Minion, MD;  Location: [facility];  Service: Orthopedics;  Laterality: Left;  . STUMP REVISION Left 12/27/2017   Procedure: LEFT BELOW KNEE AMPUTATION REVISION;  Surgeon: Newt Minion, MD;  Location: [facility];  Service: Orthopedics;  Laterality: Left;  . TOE AMPUTATION Right 2013   5th toe    There were no vitals filed for this visit.  Subjective Assessment - 05/28/18 0845    Subjective  He is wearing 15-ply socks daily. He has appt with Dr. Sharol Given today.     Patient is accompained by:  Family member   wife   Pertinent History  L TTA, DM, CKD3, PVD, arthritis, peripheral neuropathy  Limitations  Lifting;Standing;Walking;House hold activities    Patient Stated Goals  To use prosthesis to be active fish, yard work, play with grandkids, ride bicycle. Swim in pool.     Currently in Pain?  No/denies                       [facility] Adult PT Treatment/Exercise - 05/28/18 0845      Ambulation/Gait   Ambulation/Gait  Yes    Ambulation/Gait Assistance  5: Supervision    Ambulation Distance (Feet)  150 Feet    Assistive device  Prosthesis;None    Ambulation Surface  Indoor;Level    Stairs  Yes    Stairs Assistance  5: Supervision    Stair Management Technique  One rail Right;Alternating pattern;Forwards    Number of Stairs  4   3  reps   Ramp  4: Min assist   no device except prosthesis     Prosthetics   Prosthetic Care Comments   typical socket revision at 6 months; he is wearing 15 ply socks, PT recommended appt with prosthetist to add pads & alignment/ht check    Education Provided  Other (comment)   see prosthetic care comments   Person(s) Educated  Patient;Spouse    Education Method  Explanation;Verbal cues    Education Method  Verbalized understanding;Verbal cues required          Balance Exercises - 05/28/18 0845      Balance Exercises: Standing   Stepping Strategy  Anterior;Posterior;5 reps   minA: alt. stepping (ant. & post) on ramp uphill & downhill   Rockerboard  Anterior/posterior;Lateral;EO;Head turns;EC;10 seconds;Other (comment)   weight shifts with stabilization & recovery   Balance Beam  standing crossways with eyes open head turns right/left, up/down & diagonals         PT Education - 05/28/18 0925    Education Details  pace self alternating standing & seated activities. see pt instructions for progressive stationary bike program    Person(s) Educated  Patient    Methods  Explanation;Verbal cues    Comprehension  Verbalized understanding       PT Short Term Goals - 05/28/18 1348      PT SHORT TERM GOAL #1   Title  Patient verbalizes understanding of updated HEP & increasing activity level with recent CVA  (All updated STGs Target Date: 06/13/18)    Baseline  MET 04/18/2018    Time  4    Period  Weeks    Status  On-going    Target Date  06/13/18      PT SHORT TERM GOAL #2   Title  Patient ambulates 300' with cane & prosthesis with supervision.     Time  4    Status  On-going    Target Date  06/13/18      PT SHORT TERM GOAL #3   Title  Patient performs standing balance without UE support reaching 10" & picking up objects from floor without balance loss.     Time  4    Period  Weeks    Status  On-going    Target Date  06/13/18      PT SHORT TERM GOAL #4   Title   Patient negotiates ramps, curbs and stairs with 1 rail with cane & prosthesis with supervision.     Time  4    Period  Weeks    Status  On-going    Target Date  06/13/18  PT Long Term Goals - 05/28/18 1348      PT LONG TERM GOAL #1   Title  Patient demonstrates & verbalizes understanding of prosthetic care & wears prosthesis >90% of awake hours without skin or pain issues to enable safe use of prosthesis. (All LTGs Target Date: 08/10/2018)    Time  3    Period  Months    Status  On-going    Target Date  08/10/18      PT LONG TERM GOAL #2   Title  Patient demonstrates & verbalizes ongoing HEP / fitness plan with medical issues.     Time  3    Period  Months    Status  On-going    Target Date  08/10/18      PT LONG TERM GOAL #3   Title  Patient ambulates >1000' outdoors including grass, ramps & curbs with prosthesis only independently for community mobility.     Time  3    Period  Months    Status  On-going    Target Date  08/10/18      PT LONG TERM GOAL #4   Title  Berg Balance >45/56 to indicate lower fall risk.    Time  3    Period  Months    Status  On-going    Target Date  08/10/18      PT LONG TERM GOAL #5   Title  Patient perform floor transfers, lifts & carries 25# and push/pull to enable to return to yard work & other patient goals.     Time  3    Period  Months    Status  On-going    Target Date  08/10/18            Plan - 05/28/18 1349    Clinical Impression Statement  Today's skilled session focused on increasing activity level with rest periods. He is working with wife on purging / rearranging storage units and seems to understand PT recommendations for modifications. PT also worked on balance activities facilitating ankle/residual limb, hip & step strategies.     Rehab Potential  Good    PT Frequency  2x / week    PT Duration  12 weeks    PT Treatment/Interventions  ADLs/Self Care Home Management;Gait training;Stair training;Functional  mobility training;Therapeutic activities;Therapeutic exercise;DME Instruction;Balance training;Neuromuscular re-education;Patient/family education;Prosthetic Training;Vestibular;Manual techniques;Canalith Repostioning    PT Next Visit Plan  prosthetic gait with cane & balance activities.     Consulted and Agree with Plan of Care  Patient;Family member/caregiver    Family Member Consulted  wife, Adron Bene       Patient will benefit from skilled therapeutic intervention in order to improve the following deficits and impairments:  Abnormal gait, Decreased activity tolerance, Decreased balance, Decreased endurance, Decreased knowledge of use of DME, Decreased mobility, Decreased strength, Dizziness, Impaired flexibility, Postural dysfunction, Prosthetic Dependency  Visit Diagnosis: Muscle weakness (generalized)  Other abnormalities of gait and mobility  Unsteadiness on feet  Abnormal posture  History of falling     Problem List Patient Active Problem List   Diagnosis Date Noted  . Stroke ([place])   . Symptomatic bradycardia 05/06/2018  . Postural dizziness with near syncope 05/06/2018  . Labile blood glucose   . Acute blood loss anemia   . Labile blood pressure   . Stage 3 chronic kidney disease ([place])   . Type 2 diabetes mellitus with peripheral neuropathy (HCC)   . Post-operative pain   . Amputation of  left lower extremity below knee upon examination ([place]) 11/24/2017  . Amputated below knee, left ([place]) 11/22/2017  . Osteomyelitis of left foot ([place])   . Dehiscence of amputation stump ([place]) 08/11/2017  . S/P transmetatarsal amputation of foot, left ([place]) 07/19/2017  . Subacute osteomyelitis, left ankle and foot ([place]) 07/17/2017  . Diabetic ulcer of left foot associated with type 2 diabetes mellitus ([place])   . Diabetes mellitus with renal complications ([place]) 91/69/4503  . Diabetes ([place]) 10/13/2014  . Anemia 10/13/2014  . CKD (chronic kidney disease) stage 3, GFR 30-59 ml/min  (HCC) 10/13/2014  . Hyperkalemia 10/13/2014    Lil Lepage PT, DPT 05/28/2018, 1:52 PM  [facility] [address] [place], [place], [address] Phone: [phone]   Fax:  [phone]  Name: Kevin Bradford MRN: [id number] Date of Birth: 1946/04/14

## 2018-05-30 ENCOUNTER — Encounter: Payer: Self-pay | Admitting: Adult Health

## 2018-05-30 ENCOUNTER — Encounter: Payer: Self-pay | Admitting: Physical Therapy

## 2018-05-30 ENCOUNTER — Ambulatory Visit: Payer: Medicare Other | Admitting: Physical Therapy

## 2018-05-30 ENCOUNTER — Encounter (INDEPENDENT_AMBULATORY_CARE_PROVIDER_SITE_OTHER): Payer: Self-pay | Admitting: Orthopedic Surgery

## 2018-05-30 ENCOUNTER — Ambulatory Visit (INDEPENDENT_AMBULATORY_CARE_PROVIDER_SITE_OTHER): Payer: Medicare Other | Admitting: Adult Health

## 2018-05-30 VITALS — BP 116/66 | HR 74 | Ht 74.0 in | Wt 227.8 lb

## 2018-05-30 DIAGNOSIS — R2689 Other abnormalities of gait and mobility: Secondary | ICD-10-CM

## 2018-05-30 DIAGNOSIS — I63531 Cerebral infarction due to unspecified occlusion or stenosis of right posterior cerebral artery: Secondary | ICD-10-CM | POA: Diagnosis not present

## 2018-05-30 DIAGNOSIS — E1159 Type 2 diabetes mellitus with other circulatory complications: Secondary | ICD-10-CM

## 2018-05-30 DIAGNOSIS — R2681 Unsteadiness on feet: Secondary | ICD-10-CM | POA: Diagnosis not present

## 2018-05-30 DIAGNOSIS — R293 Abnormal posture: Secondary | ICD-10-CM | POA: Diagnosis not present

## 2018-05-30 DIAGNOSIS — Z794 Long term (current) use of insulin: Secondary | ICD-10-CM | POA: Diagnosis not present

## 2018-05-30 DIAGNOSIS — Z9181 History of falling: Secondary | ICD-10-CM | POA: Diagnosis not present

## 2018-05-30 DIAGNOSIS — E785 Hyperlipidemia, unspecified: Secondary | ICD-10-CM

## 2018-05-30 DIAGNOSIS — M6281 Muscle weakness (generalized): Secondary | ICD-10-CM

## 2018-05-30 NOTE — Patient Instructions (Addendum)
Continue aspirin 325 mg daily and clopidogrel 75 mg daily  and lipitor  for secondary stroke prevention  Continue aspirin and plavix until mid January and then stop aspirin and continue plavix at that time  Continue to follow with Dr. Corliss Skainseveshwar as scheduled for monitoring of your vessels   Continue to follow up with PCP regarding cholesterol and diabetes management   Continue therapies for continued dizziness/balance difficulties  Continue to monitor blood pressure at home  Maintain strict control of hypertension with blood pressure goal below 130/90, diabetes with hemoglobin A1c goal below 6.5% and cholesterol with LDL cholesterol (bad cholesterol) goal below 70 mg/dL. I also advised the patient to eat a healthy diet with plenty of whole grains, cereals, fruits and vegetables, exercise regularly and maintain ideal body weight.  Followup in the future with me in 3 months or call earlier if needed       Thank you for [facility] at [facility]. I hope we have been able to provide you high quality care today.  You may receive a patient satisfaction survey over the next few weeks. We would appreciate your feedback and comments so that we may continue to improve ourselves and the health of our patients.

## 2018-05-30 NOTE — Progress Notes (Signed)
Office Visit Note   Patient: Kevin OlpGeorge Bradford           Date of Birth: Jun 24, 1946           MRN: [id number] Visit Date: 05/28/2018              Requested by: Georgann HousekeeperHusain, Karrar, MD [address] [place], [place] [address] PCP: Georgann HousekeeperHusain, Karrar, MD  Chief Complaint  Patient presents with  . Left Leg - Follow-up    Left leg BKA      HPI: Patient is a 72 year old gentleman who presents in follow-up for left transtibial amputation he is currently wearing 15 ply socks.  Patient states he has no rotational stability.  Patient states he is status post a stroke on October 20 he is currently on Plavix and aspirin as well as Lipitor 40 mg a day.  Assessment & Plan: Visit Diagnoses:  1. Acquired absence of left leg below knee [facility](HCC)     Plan: Patient is given a prescription for new socket.  Will reevaluate as needed  Follow-Up Instructions: Return if symptoms worsen or fail to improve.   Ortho Exam  Patient is alert, oriented, no adenopathy, well-dressed, normal affect, normal respiratory effort. Examination patient has a loose fitting prosthesis with 15 ply sock he has no rotational stability no varus or valgus stability patient is at risk of falling.  He has no focal motor weakness with his recent stroke.  There is no bearing callus or ulceration in his nose infection.  Imaging: No results found. No images are attached to the encounter.  Labs: Lab Results  Component Value Date   HGBA1C 8.2 (H) 05/07/2018   HGBA1C 6.7 (H) 11/22/2017   HGBA1C 9.3 (H) 07/19/2017   ESRSEDRATE 119 (H) 10/18/2014   ESRSEDRATE 115 (H) 10/13/2014   CRP 8.5 (H) 10/18/2014   CRP 21.9 (H) 10/13/2014   LABURIC 3.3 (L) 10/14/2014   REPTSTATUS 07/24/2017 FINAL 07/19/2017   GRAMSTAIN  07/19/2017    FEW WBC PRESENT, PREDOMINANTLY MONONUCLEAR MODERATE GRAM POSITIVE COCCI IN PAIRS MODERATE GRAM NEGATIVE RODS    CULT  07/19/2017    MODERATE ENTEROCOCCUS FAECALIS FEW METHICILLIN RESISTANT  STAPHYLOCOCCUS AUREUS MODERATE BACTEROIDES ORALIS BETA LACTAMASE POSITIVE    LABORGA METHICILLIN RESISTANT STAPHYLOCOCCUS AUREUS 07/19/2017   LABORGA ENTEROCOCCUS FAECALIS 07/19/2017     Lab Results  Component Value Date   ALBUMIN 3.9 05/06/2018   ALBUMIN 3.5 11/27/2017   ALBUMIN 3.6 08/16/2017   LABURIC 3.3 (L) 10/14/2014    Body mass index is 28.25 kg/m.  Orders:  No orders of the defined types were placed in this encounter.  No orders of the defined types were placed in this encounter.    Procedures: No procedures performed  Clinical Data: No additional findings.  ROS:  All other systems negative, except as noted in the HPI. Review of Systems  Objective: Vital Signs: Ht 6\' 2"  (1.88 m)   Wt 220 lb (99.8 kg)   BMI 28.25 kg/m   Specialty Comments:  No specialty comments available.  PMFS History: Patient Active Problem List   Diagnosis Date Noted  . Stroke (HCC)   . Symptomatic bradycardia 05/06/2018  . Postural dizziness with near syncope 05/06/2018  . Labile blood glucose   . Acute blood loss anemia   . Labile blood pressure   . Stage 3 chronic kidney disease (HCC)   . Type 2 diabetes mellitus with peripheral neuropathy (HCC)   . Post-operative pain   . Amputation of  left lower extremity below knee upon examination (HCC) 11/24/2017  . Amputated below knee, left (HCC) 11/22/2017  . Osteomyelitis of left foot (HCC)   . Dehiscence of amputation stump (HCC) 08/11/2017  . S/P transmetatarsal amputation of foot, left (HCC) 07/19/2017  . Subacute osteomyelitis, left ankle and foot (HCC) 07/17/2017  . Diabetic ulcer of left foot associated with type 2 diabetes mellitus (HCC)   . Diabetes mellitus with renal complications (HCC) 10/17/2014  . Diabetes (HCC) 10/13/2014  . Anemia 10/13/2014  . CKD (chronic kidney disease) stage 3, GFR 30-59 ml/min (HCC) 10/13/2014  . Hyperkalemia 10/13/2014   Past Medical History:  Diagnosis Date  . Arthritis    "joint  pain" (08/16/2017)  . Below knee amputation (HCC)   . Cellulitis and abscess of leg 10/18/2014  . CKD (chronic kidney disease) stage 3, GFR 30-59 ml/min (HCC)    creatinine increases with antibiotics per pt, no known kidney disease per patient  . Dehiscence of closure of skin, subsequent encounter   . Diabetic foot ulcer (HCC)    left  . Diabetic peripheral neuropathy (HCC)   . GERD (gastroesophageal reflux disease)    12/26/2017- "not this year."   . Osteomyelitis (HCC)    left transmetatarsal   . Stroke (HCC)   . Type II diabetes mellitus (HCC)     Family History  Problem Relation Age of Onset  . Dementia Mother     Past Surgical History:  Procedure Laterality Date  . AMPUTATION Left 07/19/2017   Procedure: LEFT TRANSMETATARSAL AMPUTATION;  Surgeon: Nadara Mustard, MD;  Location: [facility] OR;  Service: Orthopedics;  Laterality: Left;  . AMPUTATION Left 08/16/2017   REVISION LEFT TRANSMETATARSAL AMPUTATION  . AMPUTATION Left 11/22/2017   Procedure: LEFT BELOW KNEE AMPUTATION;  Surgeon: Nadara Mustard, MD;  Location: [facility] OR;  Service: Orthopedics;  Laterality: Left;  . CATARACT EXTRACTION W/ INTRAOCULAR LENS  IMPLANT, BILATERAL  ~ 2016  . EYE SURGERY Bilateral    catarct  . I&D EXTREMITY Left 10/17/2014   Procedure: IRRIGATION AND DEBRIDEMENT EXTREMITY LEFT FOOT;  Surgeon: Nadara Mustard, MD;  Location: [facility] OR;  Service: Orthopedics;  Laterality: Left;  . IR ANGIO INTRA EXTRACRAN SEL COM CAROTID INNOMINATE BILAT MOD SED  05/07/2018  . IR ANGIO VERTEBRAL SEL SUBCLAVIAN INNOMINATE BILAT MOD SED  05/07/2018  . STUMP REVISION Left 08/16/2017   Procedure: REVISION LEFT TRANSMETATARSAL AMPUTATION;  Surgeon: Nadara Mustard, MD;  Location: [facility] OR;  Service: Orthopedics;  Laterality: Left;  . STUMP REVISION Left 12/27/2017   Procedure: LEFT BELOW KNEE AMPUTATION REVISION;  Surgeon: Nadara Mustard, MD;  Location: [facility] OR;  Service: Orthopedics;  Laterality: Left;  . TOE AMPUTATION Right 2013   5th toe    Social History   Occupational History  . Not on file  Tobacco Use  . Smoking status: Never Smoker  . Smokeless tobacco: Never Used  Substance and Sexual Activity  . Alcohol use: Yes    Alcohol/week: 1.0 standard drinks    Types: 1 Cans of beer per week    Comment:  "maybe 1 beer 2 times a month"-12/26/2017  . Drug use: No  . Sexual activity: Yes

## 2018-05-30 NOTE — Progress Notes (Signed)
[facility] [address] [place] [id number] [phone]       OFFICE FOLLOW UP NOTE  Mr. Kevin Bradford Date of Birth:  03-02-1946 Medical Record Number:  [id number]   Reason for Referral:  hospital stroke follow up  CHIEF COMPLAINT:  Chief Complaint  Patient presents with  . 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 History obtained from patient, wife and chart review. Reviewed all radiology images and labs personally.  Mr. Kevin Bradford is a 72 y.o. male with history of type II DB, DB neuropathy, L BKA, CKD  who presented with acute onset gait instability, nausea and dizziness and possibly mild R facial droop and leaning towards the right.  MRI brain reviewed and showed multiple small infarct right middle and mostly right cerebellum.  MRA head and neck showed a right V3 and V4 loss of flow.  Patient did undergo cerebral angiogram which showed occluded right ICA at C1, approximately 60% stenosis dominant left VA origin and approximate 60% stenosis origin of right VA and recommended to follow-up with Dr. Corliss Skainsdeveshwar as outpatient.  2D echo showed an EF of 60 to 65% without cardiac source of embolus.  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  Patient was on aspirin 81 mg PTA and recommended DAPT for 3 months with aspirin 325 mg and Plavix 75 mg daily and then Plavix alone.  LDL 130 and recommended initiating atorvastatin 40 mg daily.  A1c 8.2 and recommended tight glycemic control with close PCP follow-up for diabetic management.  Patient was discharged in stable condition with recommendations of outpatient PT/OT.  Patient is being seen today for hospital follow-up and is accompanied by his  wife.  Overall he is been doing well with only residual deficit of intermittent dizziness and balance difficulties.  He did did return to the ED 1 week after discharge as he was improving a couple days after discharge but then experienced increased dizziness and leaning towards the right.  MRI brain obtained which did not show any acute abnormality.  He continues to work with PT at neuro rehab.  Continues to take both aspirin and Plavix without side effects of bleeding or bruising.  Continues to take Lipitor without side effects of myalgias.  Blood pressure today 116/66.  He has returned back to all prior activities without complications.  He will be following up with Dr. Corliss Skainsdeveshwar for repeat carotid ultrasound in approximately 5 months.  Denies new or worsening stroke/TIA symptoms.   ROS:   14 system review of systems performed and negative with exception of loss of vision and allergies  PMH:  Past Medical History:  Diagnosis Date  . Arthritis    "joint pain" (08/16/2017)  . Below knee amputation (HCC)   . Cellulitis and abscess of leg 10/18/2014  . CKD (chronic kidney disease) stage 3, GFR 30-59 ml/min (HCC)    creatinine increases with antibiotics per pt, no known kidney disease per patient  . Dehiscence of closure of skin, subsequent encounter   . Diabetic foot ulcer (HCC)    left  . Diabetic peripheral neuropathy (HCC)   . GERD (gastroesophageal reflux disease)    12/26/2017- "not this year."   .  Osteomyelitis (HCC)    left transmetatarsal   . Stroke (HCC)   . Type II diabetes mellitus (HCC)     PSH:  Past Surgical History:  Procedure Laterality Date  . AMPUTATION Left 07/19/2017   Procedure: LEFT TRANSMETATARSAL AMPUTATION;  Surgeon: Nadara Mustard, MD;  Location: [facility] OR;  Service: Orthopedics;  Laterality: Left;  . AMPUTATION Left 08/16/2017   REVISION LEFT TRANSMETATARSAL AMPUTATION  . AMPUTATION Left 11/22/2017   Procedure: LEFT BELOW KNEE AMPUTATION;  Surgeon: Nadara Mustard, MD;   Location: [facility] OR;  Service: Orthopedics;  Laterality: Left;  . CATARACT EXTRACTION W/ INTRAOCULAR LENS  IMPLANT, BILATERAL  ~ 2016  . EYE SURGERY Bilateral    catarct  . I&D EXTREMITY Left 10/17/2014   Procedure: IRRIGATION AND DEBRIDEMENT EXTREMITY LEFT FOOT;  Surgeon: Nadara Mustard, MD;  Location: [facility] OR;  Service: Orthopedics;  Laterality: Left;  . IR ANGIO INTRA EXTRACRAN SEL COM CAROTID INNOMINATE BILAT MOD SED  05/07/2018  . IR ANGIO VERTEBRAL SEL SUBCLAVIAN INNOMINATE BILAT MOD SED  05/07/2018  . STUMP REVISION Left 08/16/2017   Procedure: REVISION LEFT TRANSMETATARSAL AMPUTATION;  Surgeon: Nadara Mustard, MD;  Location: [facility] OR;  Service: Orthopedics;  Laterality: Left;  . STUMP REVISION Left 12/27/2017   Procedure: LEFT BELOW KNEE AMPUTATION REVISION;  Surgeon: Nadara Mustard, MD;  Location: [facility] OR;  Service: Orthopedics;  Laterality: Left;  . TOE AMPUTATION Right 2013   5th toe    Social History:  Social History   Socioeconomic History  . Marital status: Married    Spouse name: Not on file  . Number of children: Not on file  . Years of education: Not on file  . Highest education level: Not on file  Occupational History  . Not on file  Social Needs  . Financial resource strain: Not hard at all  . Food insecurity:    Worry: Never true    Inability: Never true  . Transportation needs:    Medical: No    Non-medical: No  Tobacco Use  . Smoking status: Never Smoker  . Smokeless tobacco: Never Used  Substance and Sexual Activity  . Alcohol use: Yes    Alcohol/week: 1.0 standard drinks    Types: 1 Cans of beer per week    Comment:  "maybe 1 beer 2 times a month"-12/26/2017  . Drug use: No  . Sexual activity: Yes  Lifestyle  . Physical activity:    Days per week: 5 days    Minutes per session: 120 min  . Stress: Not at all  Relationships  . Social connections:    Talks on phone: Three times a week    Gets together: Three times a week    Attends religious service: Never     Active member of club or organization: No    Attends meetings of clubs or organizations: Never    Relationship status: Married  . Intimate partner violence:    Fear of current or ex partner: No    Emotionally abused: No    Physically abused: No    Forced sexual activity: No  Other Topics Concern  . Not on file  Social History Narrative  . Not on file    Family History:  Family History  Problem Relation Age of Onset  . Dementia Mother     Medications:   Current Outpatient Medications on File Prior to Visit  Medication Sig Dispense Refill  . aspirin 325 MG EC tablet Take 1 tablet (  325 mg total) by mouth daily. 90 tablet 0  . atorvastatin (LIPITOR) 40 MG tablet Take 1 tablet (40 mg total) by mouth daily at 6 PM. 30 tablet 0  . clopidogrel (PLAVIX) 75 MG tablet Take 1 tablet (75 mg total) by mouth daily. 30 tablet 6  . empagliflozin (JARDIANCE) 10 MG TABS tablet Take 10 mg by mouth daily.    . insulin aspart (NOVOLOG FLEXPEN) 100 UNIT/ML FlexPen Inject 4 Units into the skin 3 (three) times daily with meals. (Patient taking differently: Inject 8 Units into the skin 3 (three) times daily with meals. ) 15 mL 11  . Insulin Detemir (LEVEMIR FLEXTOUCH) 100 UNIT/ML Pen Inject 25 Units into the skin daily at 10 pm. (Patient taking differently: Inject 35 Units into the skin daily at 10 pm. ) 15 mL 11  . metFORMIN (GLUCOPHAGE) 500 MG tablet Take 1 tablet (500 mg total) by mouth 2 (two) times daily with a meal. 60 tablet 0   No current facility-administered medications on file prior to visit.     Allergies:   Allergies  Allergen Reactions  . Oxycodone Shortness Of Breath     Physical Exam  Vitals:   05/30/18 1106  BP: 116/66  Pulse: 74  Weight: 227 lb 12.8 oz (103.3 kg)  Height: 6\' 2"  (1.88 m)   Body mass index is 29.25 kg/m. No exam data present  General: well developed, well nourished, pleasant elderly Caucasian male, seated, in no evident distress Head: head normocephalic  and atraumatic.   Neck: supple with no carotid or supraclavicular bruits Cardiovascular: regular rate and rhythm, no murmurs Musculoskeletal: no deformity Skin:  no rash/petichiae Vascular:  Normal pulses all extremities  Neurologic Exam Mental Status: Awake and fully alert. Oriented to place and time. Recent and remote memory intact. Attention span, concentration and fund of knowledge appropriate. Mood and affect appropriate.  Cranial Nerves: Fundoscopic exam reveals sharp disc margins. Pupils equal, briskly reactive to light. Extraocular movements full without nystagmus. Visual fields full to confrontation. Hearing intact. Facial sensation intact. Face, tongue, palate moves normally and symmetrically.  Motor: Normal bulk and tone. Normal strength in all tested extremity muscles. Sensory.: intact to touch , pinprick , position and vibratory sensation.  Coordination: Rapid alternating movements normal in all extremities. Finger-to-nose and heel-to-shin performed accurately bilaterally. Gait and Station: Arises from chair without difficulty. Stance is normal. Gait demonstrates normal stride length and balance. Able to heel, toe and tandem walk with mild difficulty.  Romberg negative. Reflexes: 1+ and symmetric. Toes downgoing.    NIHSS  0 Modified Rankin  1    Diagnostic Data (Labs, Imaging, Testing)  MR BRAIN WO CONTRAST MR MRA HEAD  MR MRA NECK 05/07/2018 IMPRESSION: 1. Multiple small ischemic infarcts, including the right medulla oblongata and multiple foci within the right cerebellar hemisphere. No hemorrhage or mass effect. 2. Loss of flow related enhancement of the distal V3 segment and V4 segment of the right vertebral artery, consistent with occlusion. Magnetic susceptibility effect within the V4 segment on the SWI sequence suggests the presence of thrombus. 3. Otherwise normal MRA of the head and neck. 4. Findings of mild chronic microvascular ischemia.  Cerebral  angiogram 05/07/2018 IMPRESSION: Occluded right vertebral artery at the level of C1 with a stenosis of approximately 60% at its origin. Approximately 60% stenosis of the dominant left vertebral artery at its origin.  ECHOCARDIOGRAM 05/07/2018 Impressions: - LVEF 60-65%, moderate LVH, normal wall motion, grade 1 DD,   indeterminate LV filling pressure,  mild LAE, IVC suggests   elevated RA pressure of 8 mmHg.   ASSESSMENT: Kerney Hopfensperger is a 72 y.o. year old male here with multiple right PICA territory infarcts in setting of right VA occlusion on 04/23/2018 secondary to large vessel disease. Vascular risk factors include HTN, HLD, DM and CKD.  Patient is being seen today for hospital follow-up and overall has recovered well with only intermittent dizziness/balance difficulties.    PLAN:  1. Right PICA territory infarcts: Continue aspirin 325 mg daily and clopidogrel 75 mg daily  and atorvastatin 40 mg for secondary stroke prevention.  Recommended to continue aspirin and Plavix for additional 2 months and at that time stop aspirin and continue Plavix alone as 81-month DAPT completed.  Maintain strict control of hypertension with blood pressure goal below 130/90, diabetes with hemoglobin A1c goal below 6.5% and cholesterol with LDL cholesterol (bad cholesterol) goal below 70 mg/dL.  I also advised the patient to eat a healthy diet with plenty of whole grains, cereals, fruits and vegetables, exercise regularly with at least 30 minutes of continuous activity daily and maintain ideal body weight.  Continue to participate in PT for continued balance issues. 2. HTN:  Today's BP 116/66.  Not currently on any antihypertensive therapy.  Advised to continue to monitor at home along with continued follow-up with PCP for management.  Did advise patient to ensure he avoids hypotension due to intracranial stenosis. 3. HLD: Advised to continue current treatment regimen along with continued follow-up with PCP for  future prescribing and monitoring of lipid panel 4. DMII: Advised to continue to monitor glucose levels at home along with continued follow-up with PCP for management and monitoring 5.  Right VA occlusion and bilateral VA 60% stenosis: Follow-up with Dr. Corliss Skains at 6 months post stroke for continued monitoring.  80-month DAPT therapy.  Avoid hypotension.  Ensure tight control over vascular risk factors.    Follow up in 3 months or call earlier if needed   Greater than 50% of time during this 25 minute visit was spent on counseling, explanation of diagnosis of right PICA territory infarcts, reviewing risk factor management of HTN, HLD, DM and intracranial stenosis, planning of further management along with potential future management, and discussion with patient and family answering all questions.    Elya Hugh, AGNP-BC  [facility] [address] [place], [place] [address]  Phone [phone] Fax [phone] Note: This document was prepared with digital dictation and possible smart phrase technology. Any transcriptional errors that result from this process are unintentional.

## 2018-05-30 NOTE — Therapy (Signed)
Kevin Bradford [address] [place], [place], [address] Phone: [phone]   Fax:  [phone]  Physical Therapy Treatment  Patient Details  Name: Kevin Bradford MRN: [id number] Date of Birth: [birth year] Referring Provider (PT): Meridee Score, MD (cc: GNA Venancio Poisson, NP)   Encounter Date: 05/30/2018  PT End of Session - 05/30/18 2205    Visit Number  23   recert sent 45GY visit so PN at 28   Number of Visits  42    Date for PT Re-Evaluation  08/10/18    Authorization Type  Medicare & AETNA Medicare 80% AETNA covers 20% once $300 deductible (at eval $91.17 met)    Authorization Time Period  recert sent 56LS visit so PN at 28    PT Start Time  0845    PT Stop Time  0930    PT Time Calculation (min)  45 min    Equipment Utilized During Treatment  Gait belt    Activity Tolerance  Patient tolerated treatment well;Patient limited by fatigue    Behavior During Therapy  [facility] for tasks assessed/performed       Past Medical History:  Diagnosis Date  . Arthritis    "joint pain" (08/16/2017)  . Below knee amputation ([place])   . Cellulitis and abscess of leg 10/18/2014  . CKD (chronic kidney disease) stage 3, GFR 30-59 ml/min (HCC)    creatinine increases with antibiotics per pt, no known kidney disease per patient  . Dehiscence of closure of skin, subsequent encounter   . Diabetic foot ulcer ([place])    left  . Diabetic peripheral neuropathy ([place])   . GERD (gastroesophageal reflux disease)    12/26/2017- "not this year."   . Osteomyelitis ([place])    left transmetatarsal   . Stroke ([place])   . Type II diabetes mellitus ([place])     Past Surgical History:  Procedure Laterality Date  . AMPUTATION Left 07/19/2017   Procedure: LEFT TRANSMETATARSAL AMPUTATION;  Surgeon: Newt Minion, MD;  Location: [place];  Service: Orthopedics;  Laterality: Left;  . AMPUTATION Left 08/16/2017   REVISION LEFT TRANSMETATARSAL AMPUTATION  . AMPUTATION  Left 11/22/2017   Procedure: LEFT BELOW KNEE AMPUTATION;  Surgeon: Newt Minion, MD;  Location: [place];  Service: Orthopedics;  Laterality: Left;  . CATARACT EXTRACTION W/ INTRAOCULAR LENS  IMPLANT, BILATERAL  ~ 2016  . EYE SURGERY Bilateral    catarct  . I&D EXTREMITY Left 10/17/2014   Procedure: IRRIGATION AND DEBRIDEMENT EXTREMITY LEFT FOOT;  Surgeon: Newt Minion, MD;  Location: [place];  Service: Orthopedics;  Laterality: Left;  . IR ANGIO INTRA EXTRACRAN SEL COM CAROTID INNOMINATE BILAT MOD SED  05/07/2018  . IR ANGIO VERTEBRAL SEL SUBCLAVIAN INNOMINATE BILAT MOD SED  05/07/2018  . STUMP REVISION Left 08/16/2017   Procedure: REVISION LEFT TRANSMETATARSAL AMPUTATION;  Surgeon: Newt Minion, MD;  Location: [place];  Service: Orthopedics;  Laterality: Left;  . STUMP REVISION Left 12/27/2017   Procedure: LEFT BELOW KNEE AMPUTATION REVISION;  Surgeon: Newt Minion, MD;  Location: [place];  Service: Orthopedics;  Laterality: Left;  . TOE AMPUTATION Right 2013   5th toe    There were no vitals filed for this visit.  Subjective Assessment - 05/30/18 0845    Subjective  He has appointment at [facility] today. He did not sleep well last as his terminally ill sister is near end of life. He saw Dr. Sharol Given yesterday who ordered socket revision.  Patient is accompained by:  Family member    Pertinent History  L TTA, DM, CKD3, PVD, arthritis, peripheral neuropathy    Limitations  Lifting;Standing;Walking;House hold activities    Patient Stated Goals  To use prosthesis to be active fish, yard work, play with grandkids, ride bicycle. Swim in pool.     Currently in Pain?  No/denies                       [facility] Adult PT Treatment/Exercise - 05/30/18 0845      Ambulation/Gait   Ambulation/Gait  Yes    Ambulation/Gait Assistance  5: Supervision    Ambulation Distance (Feet)  200 Feet    Assistive device  Prosthesis;None    Stairs  Yes    Stairs Assistance  5: Supervision     Stair Management Technique  One rail Right;Alternating pattern;Forwards    Number of Stairs  4   3 reps   Ramp  4: Min assist   no device except prosthesis     Prosthetics   Prosthetic Care Comments   PT reviewed traveling tips with prosthesis & vascular issues.     Education Provided  Other (comment)   see prosthetic care comments   Person(s) Educated  Patient;Spouse    Education Method  Explanation;Verbal cues    Education Method  Verbalized understanding;Verbal cues required;Needs further instruction    Donning Prosthesis  Modified independent (device/increased time)          Balance Exercises - 05/30/18 0845      Balance Exercises: Standing   Stepping Strategy  Anterior;Posterior;Lateral;5 reps   minA: alt. stepping (ant. & post) on ramp uphill & downhill   Rockerboard  Anterior/posterior;Lateral;EO;Head turns;EC;10 seconds;Other (comment)   weight shifts with stabilization & recovery   Balance Beam  standing crossways with eyes open head turns right/left, up/down & diagonals           PT Short Term Goals - 05/28/18 1348      PT SHORT TERM GOAL #1   Title  Patient verbalizes understanding of updated HEP & increasing activity level with recent CVA  (All updated STGs Target Date: 06/13/18)    Baseline  MET 04/18/2018    Time  4    Period  Weeks    Status  On-going    Target Date  06/13/18      PT SHORT TERM GOAL #2   Title  Patient ambulates 300' with cane & prosthesis with supervision.     Time  4    Status  On-going    Target Date  06/13/18      PT SHORT TERM GOAL #3   Title  Patient performs standing balance without UE support reaching 10" & picking up objects from floor without balance loss.     Time  4    Period  Weeks    Status  On-going    Target Date  06/13/18      PT SHORT TERM GOAL #4   Title  Patient negotiates ramps, curbs and stairs with 1 rail with cane & prosthesis with supervision.     Time  4    Period  Weeks    Status  On-going     Target Date  06/13/18        PT Long Term Goals - 05/28/18 1348      PT LONG TERM GOAL #1   Title  Patient demonstrates & verbalizes understanding of prosthetic care &   wears prosthesis >90% of awake hours without skin or pain issues to enable safe use of prosthesis. (All LTGs Target Date: 08/10/2018)    Time  3    Period  Months    Status  On-going    Target Date  08/10/18      PT LONG TERM GOAL #2   Title  Patient demonstrates & verbalizes ongoing HEP / fitness plan with medical issues.     Time  3    Period  Months    Status  On-going    Target Date  08/10/18      PT LONG TERM GOAL #3   Title  Patient ambulates >1000' outdoors including grass, ramps & curbs with prosthesis only independently for community mobility.     Time  3    Period  Months    Status  On-going    Target Date  08/10/18      PT LONG TERM GOAL #4   Title  Berg Balance >45/56 to indicate lower fall risk.    Time  3    Period  Months    Status  On-going    Target Date  08/10/18      PT LONG TERM GOAL #5   Title  Patient perform floor transfers, lifts & carries 25# and push/pull to enable to return to yard work & other patient goals.     Time  3    Period  Months    Status  On-going    Target Date  08/10/18            Plan - 05/30/18 2205    Clinical Impression Statement  Patient's balance is improving with skilled activities working on ankle/residual limb, hip & step strategies.     Rehab Potential  Good    PT Frequency  2x / week    PT Duration  12 weeks    PT Treatment/Interventions  ADLs/Self Care Home Management;Gait training;Stair training;Functional mobility training;Therapeutic activities;Therapeutic exercise;DME Instruction;Balance training;Neuromuscular re-education;Patient/family education;Prosthetic Training;Vestibular;Manual techniques;Canalith Repostioning    PT Next Visit Plan  prosthetic gait without device & balance activities.     Consulted and Agree with Plan of Care   Patient;Family member/caregiver    Family Member Consulted  wife, Kevin Bradford       Patient will benefit from skilled therapeutic intervention in order to improve the following deficits and impairments:  Abnormal gait, Decreased activity tolerance, Decreased balance, Decreased endurance, Decreased knowledge of use of DME, Decreased mobility, Decreased strength, Dizziness, Impaired flexibility, Postural dysfunction, Prosthetic Dependency  Visit Diagnosis: Muscle weakness (generalized)  Other abnormalities of gait and mobility  Unsteadiness on feet  Abnormal posture     Problem List Patient Active Problem List   Diagnosis Date Noted  . Stroke (HCC)   . Symptomatic bradycardia 05/06/2018  . Postural dizziness with near syncope 05/06/2018  . Labile blood glucose   . Acute blood loss anemia   . Labile blood pressure   . Stage 3 chronic kidney disease (HCC)   . Type 2 diabetes mellitus with peripheral neuropathy (HCC)   . Post-operative pain   . Amputation of left lower extremity below knee upon examination (HCC) 11/24/2017  . Amputated below knee, left (HCC) 11/22/2017  . Osteomyelitis of left foot (HCC)   . Dehiscence of amputation stump (HCC) 08/11/2017  . S/P transmetatarsal amputation of foot, left (HCC) 07/19/2017  . Subacute osteomyelitis, left ankle and foot (HCC) 07/17/2017  . Diabetic ulcer of left foot associated with   type 2 diabetes mellitus ([place])   . Diabetes mellitus with renal complications ([place]) 64/68/0321  . Diabetes ([place]) 10/13/2014  . Anemia 10/13/2014  . CKD (chronic kidney disease) stage 3, GFR 30-59 ml/min (HCC) 10/13/2014  . Hyperkalemia 10/13/2014    Aaliyha Mumford  PT, DPT 05/30/2018, 10:07 PM  [facility] [address] [place], [place], [address] Phone: [phone]   Fax:  [phone]  Name: Kevin Bradford MRN: [id number] Date of Birth: [birth year]

## 2018-06-02 NOTE — Progress Notes (Signed)
I agree with the above plan 

## 2018-06-04 ENCOUNTER — Ambulatory Visit: Payer: Medicare Other | Admitting: Rehabilitation

## 2018-06-04 DIAGNOSIS — Z9181 History of falling: Secondary | ICD-10-CM | POA: Diagnosis not present

## 2018-06-04 DIAGNOSIS — R293 Abnormal posture: Secondary | ICD-10-CM | POA: Diagnosis not present

## 2018-06-04 DIAGNOSIS — M6281 Muscle weakness (generalized): Secondary | ICD-10-CM | POA: Diagnosis not present

## 2018-06-04 DIAGNOSIS — R2681 Unsteadiness on feet: Secondary | ICD-10-CM | POA: Diagnosis not present

## 2018-06-04 DIAGNOSIS — R2689 Other abnormalities of gait and mobility: Secondary | ICD-10-CM

## 2018-06-04 NOTE — Therapy (Signed)
[facility] [address] [place], [place], [address] Phone: [phone]   Fax:  [phone]  Physical Therapy Treatment  Patient Details  Name: Kevin Bradford MRN: [id number] Date of Birth: 26-May-1946 Referring Provider (PT): Meridee Score, MD (cc: GNA Venancio Poisson, NP)   Encounter Date: 06/04/2018  PT End of Session - 06/04/18 1412    Visit Number  24    Number of Visits  42    Date for PT Re-Evaluation  08/10/18    Authorization Type  Medicare & AETNA Medicare 80% AETNA covers 20% once $300 deductible (at eval $91.17 met)    Authorization Time Period  recert sent 23NT visit so PN at 28    PT Start Time  1316    PT Stop Time  1400    PT Time Calculation (min)  44 min    Equipment Utilized During Treatment  Gait belt    Activity Tolerance  Patient tolerated treatment well;Patient limited by fatigue    Behavior During Therapy  [facility] for tasks assessed/performed       Past Medical History:  Diagnosis Date  . Arthritis    "joint pain" (08/16/2017)  . Below knee amputation ([place])   . Cellulitis and abscess of leg 10/18/2014  . CKD (chronic kidney disease) stage 3, GFR 30-59 ml/min (HCC)    creatinine increases with antibiotics per pt, no known kidney disease per patient  . Dehiscence of closure of skin, subsequent encounter   . Diabetic foot ulcer ([place])    left  . Diabetic peripheral neuropathy ([place])   . GERD (gastroesophageal reflux disease)    12/26/2017- "not this year."   . Osteomyelitis ([place])    left transmetatarsal   . Stroke ([place])   . Type II diabetes mellitus ([place])     Past Surgical History:  Procedure Laterality Date  . AMPUTATION Left 07/19/2017   Procedure: LEFT TRANSMETATARSAL AMPUTATION;  Surgeon: Newt Minion, MD;  Location: [facility];  Service: Orthopedics;  Laterality: Left;  . AMPUTATION Left 08/16/2017   REVISION LEFT TRANSMETATARSAL AMPUTATION  . AMPUTATION Left 11/22/2017   Procedure: LEFT  BELOW KNEE AMPUTATION;  Surgeon: Newt Minion, MD;  Location: [facility];  Service: Orthopedics;  Laterality: Left;  . CATARACT EXTRACTION W/ INTRAOCULAR LENS  IMPLANT, BILATERAL  ~ 2016  . EYE SURGERY Bilateral    catarct  . I&D EXTREMITY Left 10/17/2014   Procedure: IRRIGATION AND DEBRIDEMENT EXTREMITY LEFT FOOT;  Surgeon: Newt Minion, MD;  Location: [facility];  Service: Orthopedics;  Laterality: Left;  . IR ANGIO INTRA EXTRACRAN SEL COM CAROTID INNOMINATE BILAT MOD SED  05/07/2018  . IR ANGIO VERTEBRAL SEL SUBCLAVIAN INNOMINATE BILAT MOD SED  05/07/2018  . STUMP REVISION Left 08/16/2017   Procedure: REVISION LEFT TRANSMETATARSAL AMPUTATION;  Surgeon: Newt Minion, MD;  Location: [facility];  Service: Orthopedics;  Laterality: Left;  . STUMP REVISION Left 12/27/2017   Procedure: LEFT BELOW KNEE AMPUTATION REVISION;  Surgeon: Newt Minion, MD;  Location: [facility];  Service: Orthopedics;  Laterality: Left;  . TOE AMPUTATION Right 2013   5th toe    There were no vitals filed for this visit.  Subjective Assessment - 06/04/18 1614    Subjective  He has not had any falls or changes. He lost his sister on 73.    Patient is accompained by:  Family member    Pertinent History  L TTA, DM, CKD3, PVD, arthritis, peripheral neuropathy    Limitations  Lifting;Standing;Walking;House hold activities    Currently in Pain?  No/denies        [facility] Adult PT Treatment/Exercise - 06/04/18 1402      Transfers   Transfers  Sit to Stand;Stand to Sit    Sit to Stand  6: Modified independent (Device/Increase time);With upper extremity assist;With armrests;From chair/3-in-1    Number of Reps  Other reps (comment)   4   Comments  Pt performed from chair to standing with cane and without cane.      Ambulation/Gait   Ambulation/Gait  Yes    Ambulation/Gait Assistance  5: Supervision    Ambulation/Gait Assistance Details  Pt was supervision for safety due to gait training without AD.    Ambulation Distance (Feet)   460 Feet   115x1   Assistive device  Prosthesis;None    Gait Pattern  Step-through pattern;Decreased step length - right;Decreased stance time - left;Decreased stride length;Decreased weight shift to left    Stairs  Yes    Stairs Assistance  5: Supervision    Stair Management Technique  One rail Right;Alternating pattern;Forwards;One rail Left;Two rails   No ad   Number of Stairs  4   3 reps   Ramp  5: Supervision   no device except prosthesis   Curb  5: Supervision    Curb Details (indicate cue type and reason)  VCs for safety and technique.    Gait Comments  Pt ambulated through clinic without complaint. Rest breaks were given after stairs and after curb/ramp.       High Level Balance   High Level Balance Activities  Other (comment)    High Level Balance Comments  Pt perfromed standing balance on complaint surface- staitc balance x 30 sec, horozontal head turns x 30 sec, vertical head turns x 30 sec, diagnal head turns x 30 sec. Stepping strategy was performed on ramp fwd/retro x10 each.      Prosthetics   Prosthetic Care Comments   Education on gradual increases in activity. Pt is wearing 13 ply and is going to schedule an appoinment with Gerald Stabs at [employer].    Education Provided  Other (comment)   see prosthetic care comments   Person(s) Educated  Patient;Spouse    Education Method  Explanation    Education Method  Verbalized understanding                        PT Short Term Goals - 05/28/18 1348      PT SHORT TERM GOAL #1   Title  Patient verbalizes understanding of updated HEP & increasing activity level with recent CVA  (All updated STGs Target Date: 06/13/18)    Baseline  MET 04/18/2018    Time  4    Period  Weeks    Status  On-going    Target Date  06/13/18      PT SHORT TERM GOAL #2   Title  Patient ambulates 300' with cane & prosthesis with supervision.     Time  4    Status  On-going    Target Date  06/13/18      PT SHORT TERM GOAL #3   Title   Patient performs standing balance without UE support reaching 10" & picking up objects from floor without balance loss.     Time  4    Period  Weeks    Status  On-going    Target Date  06/13/18      PT  SHORT TERM GOAL #4   Title  Patient negotiates ramps, curbs and stairs with 1 rail with cane & prosthesis with supervision.     Time  4    Period  Weeks    Status  On-going    Target Date  06/13/18        PT Long Term Goals - 05/28/18 1348      PT LONG TERM GOAL #1   Title  Patient demonstrates & verbalizes understanding of prosthetic care & wears prosthesis >90% of awake hours without skin or pain issues to enable safe use of prosthesis. (All LTGs Target Date: 08/10/2018)    Time  3    Period  Months    Status  On-going    Target Date  08/10/18      PT LONG TERM GOAL #2   Title  Patient demonstrates & verbalizes ongoing HEP / fitness plan with medical issues.     Time  3    Period  Months    Status  On-going    Target Date  08/10/18      PT LONG TERM GOAL #3   Title  Patient ambulates >1000' outdoors including grass, ramps & curbs with prosthesis only independently for community mobility.     Time  3    Period  Months    Status  On-going    Target Date  08/10/18      PT LONG TERM GOAL #4   Title  Berg Balance >45/56 to indicate lower fall risk.    Time  3    Period  Months    Status  On-going    Target Date  08/10/18      PT LONG TERM GOAL #5   Title  Patient perform floor transfers, lifts & carries 25# and push/pull to enable to return to yard work & other patient goals.     Time  3    Period  Months    Status  On-going    Target Date  08/10/18            Plan - 06/04/18 1412    Clinical Impression Statement  Treatment focused on gait wtihout AD and increasing activity tolerance.  He was challenged by balance exercises and required rest breaks throughout treatment. Pt would benefit from further physcial therapy to increase functional strength and balance.     Clinical Presentation  Evolving    Clinical Decision Making  Moderate    Rehab Potential  Good    PT Frequency  2x / week    PT Duration  12 weeks    PT Treatment/Interventions  ADLs/Self Care Home Management;Gait training;Stair training;Functional mobility training;Therapeutic activities;Therapeutic exercise;DME Instruction;Balance training;Neuromuscular re-education;Patient/family education;Prosthetic Training;Vestibular;Manual techniques;Canalith Repostioning    PT Next Visit Plan  Transfer from floor to chair, balance, and increasing activity tolerance.    Consulted and Agree with Plan of Care  Patient;Family member/caregiver    Family Member Consulted  wife, Adron Bene       Patient will benefit from skilled therapeutic intervention in order to improve the following deficits and impairments:  Abnormal gait, Decreased activity tolerance, Decreased balance, Decreased endurance, Decreased knowledge of use of DME, Decreased mobility, Decreased strength, Dizziness, Impaired flexibility, Postural dysfunction, Prosthetic Dependency  Visit Diagnosis: Unsteadiness on feet  Other abnormalities of gait and mobility  Muscle weakness (generalized)     Problem List Patient Active Problem List   Diagnosis Date Noted  . Stroke ([place])   . Symptomatic  bradycardia 05/06/2018  . Postural dizziness with near syncope 05/06/2018  . Labile blood glucose   . Acute blood loss anemia   . Labile blood pressure   . Stage 3 chronic kidney disease ([place])   . Type 2 diabetes mellitus with peripheral neuropathy (HCC)   . Post-operative pain   . Amputation of left lower extremity below knee upon examination ([place]) 11/24/2017  . Amputated below knee, left ([place]) 11/22/2017  . Osteomyelitis of left foot ([place])   . Dehiscence of amputation stump ([place]) 08/11/2017  . S/P transmetatarsal amputation of foot, left ([place]) 07/19/2017  . Subacute osteomyelitis, left ankle and foot ([place]) 07/17/2017  . Diabetic  ulcer of left foot associated with type 2 diabetes mellitus ([place])   . Diabetes mellitus with renal complications ([place]) 71/95/9747  . Diabetes ([place]) 10/13/2014  . Anemia 10/13/2014  . CKD (chronic kidney disease) stage 3, GFR 30-59 ml/min (HCC) 10/13/2014  . Hyperkalemia 10/13/2014    Kevin Bradford SPTA 06/04/2018, 4:18 PM  [facility] [address] [place], [place], [address] Phone: [phone]   Fax:  [phone]  Name: Kevin Bradford MRN: [id number] Date of Birth: 05/27/1946

## 2018-06-06 ENCOUNTER — Encounter: Payer: Self-pay | Admitting: Physical Therapy

## 2018-06-06 ENCOUNTER — Ambulatory Visit: Payer: Medicare Other | Admitting: Physical Therapy

## 2018-06-06 DIAGNOSIS — R2681 Unsteadiness on feet: Secondary | ICD-10-CM

## 2018-06-06 DIAGNOSIS — R2689 Other abnormalities of gait and mobility: Secondary | ICD-10-CM

## 2018-06-06 DIAGNOSIS — R293 Abnormal posture: Secondary | ICD-10-CM | POA: Diagnosis not present

## 2018-06-06 DIAGNOSIS — M6281 Muscle weakness (generalized): Secondary | ICD-10-CM

## 2018-06-06 DIAGNOSIS — Z9181 History of falling: Secondary | ICD-10-CM | POA: Diagnosis not present

## 2018-06-07 NOTE — Therapy (Signed)
[facility] [address] [place], [place], [address] Phone: [phone]   Fax:  [phone]  Physical Therapy Treatment  Patient Details  Name: Kevin Bradford MRN: [id number] Date of Birth: [birth year] Referring Provider (PT): Meridee Score, MD (cc: Farrel Conners Venancio Poisson, NP)   Encounter Date: 06/06/2018     06/06/18 1152  PT Visits / Re-Eval  Visit Number 25  Number of Visits 42  Date for PT Re-Evaluation 08/10/18  Authorization  Authorization Type Medicare & AETNA Medicare 80% AETNA covers 20% once $300 deductible (at eval $91.17 met)  Authorization Time Period recert sent 54YT visit so PN at 28  PT Time Calculation  PT Start Time 1148  PT Stop Time 1230  PT Time Calculation (min) 42 min  PT - End of Session  Equipment Utilized During Treatment Gait belt  Activity Tolerance Patient tolerated treatment well;Patient limited by fatigue  Behavior During Therapy [facility] for tasks assessed/performed    Past Medical History:  Diagnosis Date  . Arthritis    "joint pain" (08/16/2017)  . Below knee amputation ([place])   . Cellulitis and abscess of leg 10/18/2014  . CKD (chronic kidney disease) stage 3, GFR 30-59 ml/min (HCC)    creatinine increases with antibiotics per pt, no known kidney disease per patient  . Dehiscence of closure of skin, subsequent encounter   . Diabetic foot ulcer ([place])    left  . Diabetic peripheral neuropathy ([place])   . GERD (gastroesophageal reflux disease)    12/26/2017- "not this year."   . Osteomyelitis ([place])    left transmetatarsal   . Stroke ([place])   . Type II diabetes mellitus ([place])     Past Surgical History:  Procedure Laterality Date  . AMPUTATION Left 07/19/2017   Procedure: LEFT TRANSMETATARSAL AMPUTATION;  Surgeon: Newt Minion, MD;  Location: [facility];  Service: Orthopedics;  Laterality: Left;  . AMPUTATION Left 08/16/2017   REVISION LEFT TRANSMETATARSAL AMPUTATION  . AMPUTATION Left  11/22/2017   Procedure: LEFT BELOW KNEE AMPUTATION;  Surgeon: Newt Minion, MD;  Location: [facility];  Service: Orthopedics;  Laterality: Left;  . CATARACT EXTRACTION W/ INTRAOCULAR LENS  IMPLANT, BILATERAL  ~ 2016  . EYE SURGERY Bilateral    catarct  . I&D EXTREMITY Left 10/17/2014   Procedure: IRRIGATION AND DEBRIDEMENT EXTREMITY LEFT FOOT;  Surgeon: Newt Minion, MD;  Location: [facility];  Service: Orthopedics;  Laterality: Left;  . IR ANGIO INTRA EXTRACRAN SEL COM CAROTID INNOMINATE BILAT MOD SED  05/07/2018  . IR ANGIO VERTEBRAL SEL SUBCLAVIAN INNOMINATE BILAT MOD SED  05/07/2018  . STUMP REVISION Left 08/16/2017   Procedure: REVISION LEFT TRANSMETATARSAL AMPUTATION;  Surgeon: Newt Minion, MD;  Location: [facility];  Service: Orthopedics;  Laterality: Left;  . STUMP REVISION Left 12/27/2017   Procedure: LEFT BELOW KNEE AMPUTATION REVISION;  Surgeon: Newt Minion, MD;  Location: [facility];  Service: Orthopedics;  Laterality: Left;  . TOE AMPUTATION Right 2013   5th toe    There were no vitals filed for this visit.     06/06/18 1149  Symptoms/Limitations  Subjective Saw the prosthetist today. Chris added larger pads and modified socket to fit better, down to 10 ply from 18 ply. Plans for socket revision in January. No pain or falls to report.   Patient is accompained by: Family member  Pertinent History L TTA, DM, CKD3, PVD, arthritis, peripheral neuropathy  Limitations Lifting;Standing;Walking;House hold activities  Patient Stated Goals To use prosthesis  to be active fish, yard work, play with grandkids, ride bicycle. Swim in pool.   Pain Assessment  Currently in Pain? No/denies  Pain Score 0      06/06/18 1152  Transfers  Transfers Sit to Stand;Stand to Sit  Sit to Stand 6: Modified independent (Device/Increase time);With upper extremity assist;With armrests;From chair/3-in-1  Stand to Sit 6: Modified independent (Device/Increase time);With upper extremity assist;To chair/3-in-1   Ambulation/Gait  Ambulation/Gait Yes  Ambulation/Gait Assistance 6: Modified independent (Device/Increase time);5: Supervision;4: Min guard  Ambulation/Gait Assistance Details use of cane to enter/exit gym Mod I. no AD used in session. cues for posture, to decr abduction of prosthesis for more normalized base of support.   Ambulation Distance (Feet) 350 Feet (x1, plus around gym with activity)  Assistive device Prosthesis;None;Straight cane  Gait Pattern Step-through pattern;Decreased step length - right;Decreased stance time - left;Decreased stride length;Decreased weight shift to left  Ambulation Surface Level;Indoor  Stairs Yes  Stairs Assistance 5: Supervision  Stairs Assistance Details (indicate cue type and reason) reminder cues on hand advancement on rails and weight shifting         Stair Management Technique One rail Left;Alternating pattern;Forwards  Number of Stairs 4 (x3 reps)  Ramp Other (comment) (min guard assist)  Ramp Details (indicate cue type and reason) with red mat over ramp: 4 reps with min guard to supervision assist. cues on posture  Neuro Re-ed   Neuro Re-ed Details  for balance/NMR: gait around track with speed changes, direction changes, turns, and enviromental scanning. cues to maintain pace with head turns, no significant differnence noted with speed changes to normal to fast pace. no balance loss with any dynamic gait activities performed.    Prosthetics  Current prosthetic wear tolerance (days/week)  daily  Current prosthetic wear tolerance (#hours/day)  most awake hours, drying every 3-4 hours  Donning Prosthesis 6  Doffing Prosthesis 6       06/06/18 1212  Balance Exercises: Standing  Stepping Strategy Anterior;Posterior;Foam/compliant surface;UE support  Balance Beam standing across red beam wtih feet together: EC no head movements, progressing to EO, then EC for head movements left<>right. min guard to min assist for balance with occasional touch to  bars for balance assistance.   Step Over Hurdles / Cones small hurdles in a row: fwd reciprocal stepping over 4 of them with light UE support. cues for increased hip flexion, decreased circumduction of prosthesis with clearing them. performed x 7 laps with min guard assit.               Balance Exercises: Standing  Rebounder Limitations stepping strategies: fwd stepping to floor back onto airex, then bwd stepping to floor/back onto airex. alternating LE's with cues on step length/step height. min guard assist.      PT Short Term Goals - 05/28/18 1348      PT SHORT TERM GOAL #1   Title  Patient verbalizes understanding of updated HEP & increasing activity level with recent CVA  (All updated STGs Target Date: 06/13/18)    Baseline  MET 04/18/2018    Time  4    Period  Weeks    Status  On-going    Target Date  06/13/18      PT SHORT TERM GOAL #2   Title  Patient ambulates 300' with cane & prosthesis with supervision.     Time  4    Status  On-going    Target Date  06/13/18      PT SHORT TERM  GOAL #3   Title  Patient performs standing balance without UE support reaching 10" & picking up objects from floor without balance loss.     Time  4    Period  Weeks    Status  On-going    Target Date  06/13/18      PT SHORT TERM GOAL #4   Title  Patient negotiates ramps, curbs and stairs with 1 rail with cane & prosthesis with supervision.     Time  4    Period  Weeks    Status  On-going    Target Date  06/13/18        PT Long Term Goals - 05/28/18 1348      PT LONG TERM GOAL #1   Title  Patient demonstrates & verbalizes understanding of prosthetic care & wears prosthesis >90% of awake hours without skin or pain issues to enable safe use of prosthesis. (All LTGs Target Date: 08/10/2018)    Time  3    Period  Months    Status  On-going    Target Date  08/10/18      PT LONG TERM GOAL #2   Title  Patient demonstrates & verbalizes ongoing HEP / fitness plan with medical issues.      Time  3    Period  Months    Status  On-going    Target Date  08/10/18      PT LONG TERM GOAL #3   Title  Patient ambulates >1000' outdoors including grass, ramps & curbs with prosthesis only independently for community mobility.     Time  3    Period  Months    Status  On-going    Target Date  08/10/18      PT LONG TERM GOAL #4   Title  Berg Balance >45/56 to indicate lower fall risk.    Time  3    Period  Months    Status  On-going    Target Date  08/10/18      PT LONG TERM GOAL #5   Title  Patient perform floor transfers, lifts & carries 25# and push/pull to enable to return to yard work & other patient goals.     Time  3    Period  Months    Status  On-going    Target Date  08/10/18         06/06/18 1152  Plan  Clinical Impression Statement Today's skilled session continued to focus on dynamic gait with prosthesis only and high level balance reactions. The pt is progressing well toward goals and should benefit from continued PT to progress toward unmet goals.   Pt will benefit from skilled therapeutic intervention in order to improve on the following deficits Abnormal gait;Decreased activity tolerance;Decreased balance;Decreased endurance;Decreased knowledge of use of DME;Decreased mobility;Decreased strength;Dizziness;Impaired flexibility;Postural dysfunction;Prosthetic Dependency  Rehab Potential Good  PT Frequency 2x / week  PT Duration 12 weeks  PT Treatment/Interventions ADLs/Self Care Home Management;Gait training;Stair training;Functional mobility training;Therapeutic activities;Therapeutic exercise;DME Instruction;Balance training;Neuromuscular re-education;Patient/family education;Prosthetic Training;Vestibular;Manual techniques;Canalith Repostioning  PT Next Visit Plan Transfer from floor to chair, balance, and increasing activity tolerance.  Consulted and Agree with Plan of Care Patient;Family member/caregiver  Family Member Consulted wife, Adron Bene           Patient will benefit from skilled therapeutic intervention in order to improve the following deficits and impairments:  Abnormal gait, Decreased activity tolerance, Decreased balance, Decreased endurance, Decreased knowledge of use of DME, Decreased  mobility, Decreased strength, Dizziness, Impaired flexibility, Postural dysfunction, Prosthetic Dependency  Visit Diagnosis: Unsteadiness on feet  Other abnormalities of gait and mobility  Muscle weakness (generalized)  Abnormal posture     Problem List Patient Active Problem List   Diagnosis Date Noted  . Stroke ([place])   . Symptomatic bradycardia 05/06/2018  . Postural dizziness with near syncope 05/06/2018  . Labile blood glucose   . Acute blood loss anemia   . Labile blood pressure   . Stage 3 chronic kidney disease ([place])   . Type 2 diabetes mellitus with peripheral neuropathy (HCC)   . Post-operative pain   . Amputation of left lower extremity below knee upon examination ([place]) 11/24/2017  . Amputated below knee, left ([place]) 11/22/2017  . Osteomyelitis of left foot ([place])   . Dehiscence of amputation stump ([place]) 08/11/2017  . S/P transmetatarsal amputation of foot, left ([place]) 07/19/2017  . Subacute osteomyelitis, left ankle and foot ([place]) 07/17/2017  . Diabetic ulcer of left foot associated with type 2 diabetes mellitus ([place])   . Diabetes mellitus with renal complications ([place]) 38/68/5488  . Diabetes ([place]) 10/13/2014  . Anemia 10/13/2014  . CKD (chronic kidney disease) stage 3, GFR 30-59 ml/min (HCC) 10/13/2014  . Hyperkalemia 10/13/2014    Willow Ora 06/07/2018, 11:01 PM  [facility] [address] [place], [place], [address] Phone: [phone]   Fax:  [phone]  Name: Kevin Bradford MRN: [id number] Date of Birth: 1945-12-14

## 2018-06-11 ENCOUNTER — Ambulatory Visit: Payer: Medicare Other | Admitting: Physical Therapy

## 2018-06-11 DIAGNOSIS — R2689 Other abnormalities of gait and mobility: Secondary | ICD-10-CM | POA: Diagnosis not present

## 2018-06-11 DIAGNOSIS — M6281 Muscle weakness (generalized): Secondary | ICD-10-CM | POA: Diagnosis not present

## 2018-06-11 DIAGNOSIS — R293 Abnormal posture: Secondary | ICD-10-CM

## 2018-06-11 DIAGNOSIS — R2681 Unsteadiness on feet: Secondary | ICD-10-CM

## 2018-06-11 DIAGNOSIS — Z9181 History of falling: Secondary | ICD-10-CM | POA: Diagnosis not present

## 2018-06-11 NOTE — Therapy (Signed)
[facility] [address] [place], [place], [address] Phone: [phone]   Fax:  [phone]  Physical Therapy Treatment  Patient Details  Name: Kevin Bradford MRN: [id number] Date of Birth: 16-Mar-1946 Referring Provider (PT): Meridee Score, MD (cc: [facility] Venancio Poisson, NP)   Encounter Date: 06/11/2018  PT End of Session - 06/11/18 1411    Visit Number  26    Number of Visits  42    Date for PT Re-Evaluation  08/10/18    Authorization Type  Medicare & AETNA Medicare 80% AETNA covers 20% once $300 deductible (at eval $91.17 met)    Authorization Time Period  recert sent 67RF visit so PN at 28    PT Start Time  1316    PT Stop Time  1359    PT Time Calculation (min)  43 min    Equipment Utilized During Treatment  Gait belt    Activity Tolerance  Patient tolerated treatment well;Patient limited by fatigue    Behavior During Therapy  [facility] for tasks assessed/performed       Past Medical History:  Diagnosis Date  . Arthritis    "joint pain" (08/16/2017)  . Below knee amputation ([place])   . Cellulitis and abscess of leg 10/18/2014  . CKD (chronic kidney disease) stage 3, GFR 30-59 ml/min (HCC)    creatinine increases with antibiotics per pt, no known kidney disease per patient  . Dehiscence of closure of skin, subsequent encounter   . Diabetic foot ulcer ([place])    left  . Diabetic peripheral neuropathy ([place])   . GERD (gastroesophageal reflux disease)    12/26/2017- "not this year."   . Osteomyelitis ([place])    left transmetatarsal   . Stroke ([place])   . Type II diabetes mellitus ([place])     Past Surgical History:  Procedure Laterality Date  . AMPUTATION Left 07/19/2017   Procedure: LEFT TRANSMETATARSAL AMPUTATION;  Surgeon: Newt Minion, MD;  Location: [facility];  Service: Orthopedics;  Laterality: Left;  . AMPUTATION Left 08/16/2017   REVISION LEFT TRANSMETATARSAL AMPUTATION  . AMPUTATION Left 11/22/2017   Procedure: LEFT  BELOW KNEE AMPUTATION;  Surgeon: Newt Minion, MD;  Location: [facility];  Service: Orthopedics;  Laterality: Left;  . CATARACT EXTRACTION W/ INTRAOCULAR LENS  IMPLANT, BILATERAL  ~ 2016  . EYE SURGERY Bilateral    catarct  . I&D EXTREMITY Left 10/17/2014   Procedure: IRRIGATION AND DEBRIDEMENT EXTREMITY LEFT FOOT;  Surgeon: Newt Minion, MD;  Location: [facility];  Service: Orthopedics;  Laterality: Left;  . IR ANGIO INTRA EXTRACRAN SEL COM CAROTID INNOMINATE BILAT MOD SED  05/07/2018  . IR ANGIO VERTEBRAL SEL SUBCLAVIAN INNOMINATE BILAT MOD SED  05/07/2018  . STUMP REVISION Left 08/16/2017   Procedure: REVISION LEFT TRANSMETATARSAL AMPUTATION;  Surgeon: Newt Minion, MD;  Location: [facility];  Service: Orthopedics;  Laterality: Left;  . STUMP REVISION Left 12/27/2017   Procedure: LEFT BELOW KNEE AMPUTATION REVISION;  Surgeon: Newt Minion, MD;  Location: [facility];  Service: Orthopedics;  Laterality: Left;  . TOE AMPUTATION Right 2013   5th toe    There were no vitals filed for this visit.  Subjective Assessment - 06/11/18 1322    Subjective  I have not had any falls. He is headed to the beach for the holiday and staying at a house that is three stories. He may or may not attempt walking on the beach.    Patient is accompained by:  Family member    Pertinent History  L TTA, DM, CKD3, PVD, arthritis, peripheral neuropathy    Limitations  Lifting;Standing;Walking;House hold activities    Patient Stated Goals  To use prosthesis to be active fish, yard work, play with grandkids, ride bicycle. Swim in pool.     Currently in Pain?  No/denies         [facility] PT Assessment - 06/11/18 1339      Berg Balance Test   Sit to Stand  Able to stand without using hands and stabilize independently    Standing Unsupported  Able to stand safely 2 minutes    Sitting with Back Unsupported but Feet Supported on Floor or Stool  Able to sit safely and securely 2 minutes    Stand to Sit  Sits safely with minimal use of  hands    Transfers  Able to transfer safely, minor use of hands    Standing Unsupported with Eyes Closed  Able to stand 10 seconds safely    Standing Ubsupported with Feet Together  Able to place feet together independently and stand 1 minute safely    From Standing, Reach Forward with Outstretched Arm  Can reach forward >12 cm safely (5")    From Standing Position, Pick up Object from [place] to pick up shoe safely and easily    From Standing Position, Turn to Look Behind Over each Shoulder  Looks behind from both sides and weight shifts well    Turn 360 Degrees  Able to turn 360 degrees safely but slowly    Standing Unsupported, Alternately Place Feet on Step/Stool  Able to complete >2 steps/needs minimal assist    Standing Unsupported, One Foot in Front  Able to take small step independently and hold 30 seconds    Standing on One Leg  Tries to lift leg/unable to hold 3 seconds but remains standing independently    Total Score  45    Berg comment:  On 10/30 Merrilee Jansky was 29/56                   [facility] Adult PT Treatment/Exercise - 06/11/18 0001      Ambulation/Gait   Ambulation/Gait  Yes    Ambulation/Gait Assistance  5: Supervision    Ambulation/Gait Assistance Details  --    Ambulation Distance (Feet)  345 Feet    Assistive device  Prosthesis;None    Gait Pattern  Step-through pattern;Decreased step length - right;Decreased stance time - left;Decreased stride length;Decreased weight shift to left    Ambulation Surface  Level;Indoor    Stairs  Yes    Stairs Assistance  5: Supervision    Stairs Assistance Details (indicate cue type and reason)  VCs for safety and possibility of negotiating steps with open face due to upcoming beach trip.    Stair Management Technique  One rail Left;Alternating pattern;Forwards    Number of Stairs  4   3 reps   Height of Stairs  6    Ramp  5: Supervision   prosthesis only   Ramp Details (indicate cue type and reason)  Supervision due to  increased time and effort.    Curb  5: Supervision   prosthesis only   Curb Details (indicate cue type and reason)  supervision with mild instability    Gait Comments  Pt ambulated through and demonstrated change of direction and speed due to crowded clinic with LOB. Pt was supervision on stairs but verbalized a slight difficulty with  increased step height.      Prosthetics   Prosthetic Care Comments   Education on open faced stairs, ambulating in sand, and seeking assistance when needed due to upcoming family beach trip.  Pt advised to seek availability of handicap beach access.    Current prosthetic wear tolerance (days/week)   daily    Current prosthetic wear tolerance (#hours/day)   most awake hours, drying every 3-4 hours    Education Provided  Other (comment)   see prosthetic care comments   Person(s) Educated  Patient    Education Method  Explanation    Education Method  Verbalized understanding;Returned demonstration             PT Education - 06/11/18 1411    Education Details  See prosthetci care comments.    Person(s) Educated  Patient    Methods  Explanation    Comprehension  Verbalized understanding       PT Short Term Goals - 06/11/18 1401      PT SHORT TERM GOAL #1   Title  Patient verbalizes understanding of updated HEP & increasing activity level with recent CVA  (All updated STGs Target Date: 06/13/18)    Baseline  --    Time  4    Period  Weeks    Status  On-going    Target Date  06/13/18      PT SHORT TERM GOAL #2   Title  Patient ambulates 300' with cane & prosthesis with supervision.     Baseline  Achieved 06/11/2018 without AD with prosthesis only    Time  4    Period  Weeks    Status  Achieved      PT SHORT TERM GOAL #3   Title  Patient performs standing balance without UE support reaching 10" & picking up objects from floor without balance loss.     Baseline  06/11/2018: Able to pick item up from floor but unable to reach 10" pt reached 5"      Time  4    Period  Weeks    Status  Partially Met      PT SHORT TERM GOAL #4   Title  Patient negotiates ramps, curbs and stairs with 1 rail with cane & prosthesis with supervision.     Baseline  Met with prosthesis only no assistive device 06/11/2018    Time  4    Period  Weeks    Status  Achieved      PT SHORT TERM GOAL #5   Title  Patient able to pick up & carry 20# box with cues.     Baseline  --    Time  4    Period  Weeks    Status  On-going    Target Date  06/13/18        PT Long Term Goals - 05/28/18 1348      PT LONG TERM GOAL #1   Title  Patient demonstrates & verbalizes understanding of prosthetic care & wears prosthesis >90% of awake hours without skin or pain issues to enable safe use of prosthesis. (All LTGs Target Date: 08/10/2018)    Time  3    Period  Months    Status  On-going    Target Date  08/10/18      PT LONG TERM GOAL #2   Title  Patient demonstrates & verbalizes ongoing HEP / fitness plan with medical issues.     Time  3  Period  Months    Status  On-going    Target Date  08/10/18      PT LONG TERM GOAL #3   Title  Patient ambulates >1000' outdoors including grass, ramps & curbs with prosthesis only independently for community mobility.     Time  3    Period  Months    Status  On-going    Target Date  08/10/18      PT LONG TERM GOAL #4   Title  Berg Balance >45/56 to indicate lower fall risk.    Time  3    Period  Months    Status  On-going    Target Date  08/10/18      PT LONG TERM GOAL #5   Title  Patient perform floor transfers, lifts & carries 25# and push/pull to enable to return to yard work & other patient goals.     Time  3    Period  Months    Status  On-going    Target Date  08/10/18            Plan - 06/11/18 1411    Clinical Impression Statement  Todays treatment focused on checking STGs and performing Berg balance test for better assessment of pt status. Education was provided for safety and technique for  upcoming beach trip ( see prosthetic care).  Pt would benefit from further physical therapy to improve balance deficits and increase activity tolerance.    Clinical Presentation  Evolving    Clinical Decision Making  Moderate    Rehab Potential  Good    PT Frequency  2x / week    PT Duration  12 weeks    PT Treatment/Interventions  ADLs/Self Care Home Management;Gait training;Stair training;Functional mobility training;Therapeutic activities;Therapeutic exercise;DME Instruction;Balance training;Neuromuscular re-education;Patient/family education;Prosthetic Training;Vestibular;Manual techniques;Canalith Repostioning    PT Next Visit Plan  Perform FGA, check remainder of STGs, work on gait and balance activities.    Consulted and Agree with Plan of Care  Patient       Patient will benefit from skilled therapeutic intervention in order to improve the following deficits and impairments:  Abnormal gait, Decreased activity tolerance, Decreased balance, Decreased endurance, Decreased knowledge of use of DME, Decreased mobility, Decreased strength, Dizziness, Impaired flexibility, Postural dysfunction, Prosthetic Dependency  Visit Diagnosis: Other abnormalities of gait and mobility  Unsteadiness on feet  Muscle weakness (generalized)  Abnormal posture     Problem List Patient Active Problem List   Diagnosis Date Noted  . Stroke ([place])   . Symptomatic bradycardia 05/06/2018  . Postural dizziness with near syncope 05/06/2018  . Labile blood glucose   . Acute blood loss anemia   . Labile blood pressure   . Stage 3 chronic kidney disease ([place])   . Type 2 diabetes mellitus with peripheral neuropathy (HCC)   . Post-operative pain   . Amputation of left lower extremity below knee upon examination ([place]) 11/24/2017  . Amputated below knee, left ([place]) 11/22/2017  . Osteomyelitis of left foot ([place])   . Dehiscence of amputation stump ([place]) 08/11/2017  . S/P transmetatarsal amputation of foot,  left ([place]) 07/19/2017  . Subacute osteomyelitis, left ankle and foot ([place]) 07/17/2017  . Diabetic ulcer of left foot associated with type 2 diabetes mellitus ([place])   . Diabetes mellitus with renal complications ([place]) 41/32/4401  . Diabetes ([place]) 10/13/2014  . Anemia 10/13/2014  . CKD (chronic kidney disease) stage 3, GFR 30-59 ml/min (HCC) 10/13/2014  . Hyperkalemia 10/13/2014  [address] [place], SPTA 06/11/2018, 2:16 PM  WALDRON,ROBIN PT, DPT 06/11/2018, 3:20 PM  [facility] [address] [place], [place], [address] Phone: [phone]   Fax:  [phone]  Name: Kevin Bradford MRN: [id number] Date of Birth: [birth year]

## 2018-06-12 ENCOUNTER — Ambulatory Visit: Payer: Medicare Other | Admitting: Adult Health

## 2018-06-12 ENCOUNTER — Encounter: Payer: Self-pay | Admitting: Physical Therapy

## 2018-06-12 ENCOUNTER — Ambulatory Visit: Payer: Medicare Other | Admitting: Physical Therapy

## 2018-06-12 DIAGNOSIS — R293 Abnormal posture: Secondary | ICD-10-CM

## 2018-06-12 DIAGNOSIS — R2681 Unsteadiness on feet: Secondary | ICD-10-CM

## 2018-06-12 DIAGNOSIS — R2689 Other abnormalities of gait and mobility: Secondary | ICD-10-CM | POA: Diagnosis not present

## 2018-06-12 DIAGNOSIS — M6281 Muscle weakness (generalized): Secondary | ICD-10-CM | POA: Diagnosis not present

## 2018-06-12 DIAGNOSIS — Z9181 History of falling: Secondary | ICD-10-CM | POA: Diagnosis not present

## 2018-06-13 ENCOUNTER — Ambulatory Visit: Payer: Medicare Other | Admitting: Physical Therapy

## 2018-06-13 NOTE — Therapy (Signed)
[facility] [address] [place], [place], [address] Phone: [phone]   Fax:  [phone]  Physical Therapy Treatment  Patient Details  Name: Kevin Bradford MRN: [id number] Date of Birth: May 06, 1946 Referring Provider (PT): Meridee Score, MD (cc: GNA Venancio Poisson, NP)   Encounter Date: 06/12/2018  PT End of Session - 06/13/18 1048    Visit Number  27    Number of Visits  42    Date for PT Re-Evaluation  08/10/18    Authorization Type  Medicare & AETNA Medicare 80% AETNA covers 20% once $300 deductible (at eval $91.17 met)    Authorization Time Period  recert sent 72IO visit so PN at 28    PT Start Time  1530    PT Stop Time  1615    PT Time Calculation (min)  45 min    Equipment Utilized During Treatment  Gait belt    Activity Tolerance  Patient tolerated treatment well;Patient limited by fatigue    Behavior During Therapy  [facility] for tasks assessed/performed       Past Medical History:  Diagnosis Date  . Arthritis    "joint pain" (08/16/2017)  . Below knee amputation ([place])   . Cellulitis and abscess of leg 10/18/2014  . CKD (chronic kidney disease) stage 3, GFR 30-59 ml/min (HCC)    creatinine increases with antibiotics per pt, no known kidney disease per patient  . Dehiscence of closure of skin, subsequent encounter   . Diabetic foot ulcer ([place])    left  . Diabetic peripheral neuropathy ([place])   . GERD (gastroesophageal reflux disease)    12/26/2017- "not this year."   . Osteomyelitis ([place])    left transmetatarsal   . Stroke ([place])   . Type II diabetes mellitus ([place])     Past Surgical History:  Procedure Laterality Date  . AMPUTATION Left 07/19/2017   Procedure: LEFT TRANSMETATARSAL AMPUTATION;  Surgeon: Newt Minion, MD;  Location: [facility];  Service: Orthopedics;  Laterality: Left;  . AMPUTATION Left 08/16/2017   REVISION LEFT TRANSMETATARSAL AMPUTATION  . AMPUTATION Left 11/22/2017   Procedure: LEFT  BELOW KNEE AMPUTATION;  Surgeon: Newt Minion, MD;  Location: [facility];  Service: Orthopedics;  Laterality: Left;  . CATARACT EXTRACTION W/ INTRAOCULAR LENS  IMPLANT, BILATERAL  ~ 2016  . EYE SURGERY Bilateral    catarct  . I&D EXTREMITY Left 10/17/2014   Procedure: IRRIGATION AND DEBRIDEMENT EXTREMITY LEFT FOOT;  Surgeon: Newt Minion, MD;  Location: [facility];  Service: Orthopedics;  Laterality: Left;  . IR ANGIO INTRA EXTRACRAN SEL COM CAROTID INNOMINATE BILAT MOD SED  05/07/2018  . IR ANGIO VERTEBRAL SEL SUBCLAVIAN INNOMINATE BILAT MOD SED  05/07/2018  . STUMP REVISION Left 08/16/2017   Procedure: REVISION LEFT TRANSMETATARSAL AMPUTATION;  Surgeon: Newt Minion, MD;  Location: [facility];  Service: Orthopedics;  Laterality: Left;  . STUMP REVISION Left 12/27/2017   Procedure: LEFT BELOW KNEE AMPUTATION REVISION;  Surgeon: Newt Minion, MD;  Location: [facility];  Service: Orthopedics;  Laterality: Left;  . TOE AMPUTATION Right 2013   5th toe    There were no vitals filed for this visit.  Subjective Assessment - 06/12/18 1531    Subjective  No falls. No soreness from yesterday.     Patient is accompained by:  Family member    Pertinent History  L TTA, DM, CKD3, PVD, arthritis, peripheral neuropathy    Limitations  Lifting;Standing;Walking;House hold activities  Patient Stated Goals  To use prosthesis to be active fish, yard work, play with grandkids, ride bicycle. Swim in pool.     Currently in Pain?  No/denies         [facility] PT Assessment - 06/12/18 1530      Functional Gait  Assessment   Gait assessed   Yes    Gait Level Surface  Walks 20 ft in less than 7 sec but greater than 5.5 sec, uses assistive device, slower speed, mild gait deviations, or deviates 6-10 in outside of the 12 in walkway width.    Change in Gait Speed  Makes only minor adjustments to walking speed, or accomplishes a change in speed with significant gait deviations, deviates 10-15 in outside the 12 in walkway width, or  changes speed but loses balance but is able to recover and continue walking.    Gait with Horizontal Head Turns  Performs head turns with moderate changes in gait velocity, slows down, deviates 10-15 in outside 12 in walkway width but recovers, can continue to walk.    Gait with Vertical Head Turns  Performs task with moderate change in gait velocity, slows down, deviates 10-15 in outside 12 in walkway width but recovers, can continue to walk.    Gait and Pivot Turn  Turns slowly, requires verbal cueing, or requires several small steps to catch balance following turn and stop    Step Over Obstacle  Is able to step over one shoe box (4.5 in total height) but must slow down and adjust steps to clear box safely. May require verbal cueing.    Gait with Narrow Base of Support  Ambulates less than 4 steps heel to toe or cannot perform without assistance.    Gait with Eyes Closed  Walks 20 ft, slow speed, abnormal gait pattern, evidence for imbalance, deviates 10-15 in outside 12 in walkway width. Requires more than 9 sec to ambulate 20 ft.    Ambulating Backwards  Walks 20 ft, slow speed, abnormal gait pattern, evidence for imbalance, deviates 10-15 in outside 12 in walkway width.    Steps  Alternating feet, must use rail.    Total Score  11                   [facility] Adult PT Treatment/Exercise - 06/12/18 1530      Ambulation/Gait   Ambulation/Gait  Yes    Ambulation/Gait Assistance  5: Supervision    Ambulation/Gait Assistance Details  demo & verbal cues on proper step width /not abducting and wt shift over stance limb.     Ambulation Distance (Feet)  345 Feet    Assistive device  Prosthesis;None    Gait Pattern  Step-through pattern;Decreased step length - right;Decreased stance time - left;Decreased stride length;Decreased weight shift to left    Ambulation Surface  Indoor;Level    Ramp  5: Supervision   prosthesis only   Curb  5: Supervision   prosthesis only   Gait Comments   Treadmill with Gait Trainer program: speed up to 1.59mh for 4 minutes. Step length 57cm left 58cm right Stance % right 52% left 48%      Prosthetics   Current prosthetic wear tolerance (days/week)   daily    Current prosthetic wear tolerance (#hours/day)   most awake hours, drying every 3-4 hours               PT Short Term Goals - 06/12/18 1800      PT SHORT  TERM GOAL #1   Title  Patient verbalizes understanding of updated HEP & increasing activity level with recent CVA  (All updated STGs Target Date: 06/13/18)    Baseline  MET 06/12/2018    Time  4    Period  Weeks    Status  Achieved      PT SHORT TERM GOAL #2   Title  Patient ambulates 300' with cane & prosthesis with supervision.     Baseline  Achieved 06/11/2018 without AD with prosthesis only    Time  4    Period  Weeks    Status  Achieved      PT SHORT TERM GOAL #3   Title  Patient performs standing balance without UE support reaching 10" & picking up objects from floor without balance loss.     Baseline  MET 06/12/2018    Time  4    Period  Weeks    Status  Achieved      PT SHORT TERM GOAL #4   Title  Patient negotiates ramps, curbs and stairs with 1 rail with cane & prosthesis with supervision.     Baseline  Met with prosthesis only no assistive device 06/11/2018    Time  4    Period  Weeks    Status  Achieved      PT SHORT TERM GOAL #5   Title  Patient able to pick up & carry 20# box with cues.     Baseline  MET 06/12/2018    Time  4    Period  Weeks    Status  Achieved        PT Short Term Goals - 06/13/18 1213      PT SHORT TERM GOAL #1   Title  Patient reports return to exercising without issues. (All updated STGs 07/12/2018)    Time  1    Period  Months    Status  New    Target Date  07/12/18      PT SHORT TERM GOAL #2   Title  Patient ambulates 1000' outdoors on paved surfaces with prosthesis only with supervision.     Time  1    Period  Months    Status  New    Target Date   07/12/18      PT SHORT TERM GOAL #3   Title  Berg Balance >52/56    Time  1    Period  Months    Status  New    Target Date  07/12/18      PT SHORT TERM GOAL #4   Title  Patient negotiates ramps, curbs and stairs with 1 rail with prosthesis only with supervision.     Time  1    Status  New    Target Date  07/12/18         PT Long Term Goals - 06/12/18 1800      PT LONG TERM GOAL #1   Title  Patient demonstrates & verbalizes understanding of prosthetic care & wears prosthesis >90% of awake hours without skin or pain issues to enable safe use of prosthesis. (All LTGs Target Date: 08/10/2018)    Time  3    Period  Months    Status  On-going    Target Date  08/10/17      PT LONG TERM GOAL #2   Title  Patient demonstrates & verbalizes ongoing HEP / fitness plan with medical issues.     Time  3    Period  Months    Status  On-going    Target Date  08/10/17      PT LONG TERM GOAL #3   Title  Patient ambulates >1000' outdoors including grass, ramps & curbs with prosthesis only independently for community mobility.     Time  3    Period  Months    Status  On-going    Target Date  08/10/17      PT LONG TERM GOAL #4   Title  Berg Balance >45/56 to indicate lower fall risk.    Time  3    Period  Months    Status  On-going    Target Date  08/10/17      PT LONG TERM GOAL #5   Title  Patient perform floor transfers, lifts & carries 25# and push/pull to enable to return to yard work & other patient goals.     Time  3    Period  Months    Status  On-going    Target Date  08/10/17      Additional Long Term Goals   Additional Long Term Goals  Yes      PT LONG TERM GOAL #6   Title  Functional Gait Assessment >19/30 to indicate lower fall risk.     Status  New    Target Date  08/10/17            Plan - 06/12/18 1800    Clinical Impression Statement  Patient appeared to have flatter affect today but may be related to recent death of his sister. He has general  understanding of treadmill use but would benefit from additional training. His gait improved with instruction in proper step width / not abducting.     Rehab Potential  Good    PT Frequency  2x / week    PT Duration  12 weeks    PT Treatment/Interventions  ADLs/Self Care Home Management;Gait training;Stair training;Functional mobility training;Therapeutic activities;Therapeutic exercise;DME Instruction;Balance training;Neuromuscular re-education;Patient/family education;Prosthetic Training;Vestibular;Manual techniques;Canalith Repostioning    PT Next Visit Plan  work on gait and balance activities towards updated STGs    Consulted and Agree with Plan of Care  Patient       Patient will benefit from skilled therapeutic intervention in order to improve the following deficits and impairments:  Abnormal gait, Decreased activity tolerance, Decreased balance, Decreased endurance, Decreased knowledge of use of DME, Decreased mobility, Decreased strength, Dizziness, Impaired flexibility, Postural dysfunction, Prosthetic Dependency  Visit Diagnosis: Other abnormalities of gait and mobility  Unsteadiness on feet  Muscle weakness (generalized)  Abnormal posture     Problem List Patient Active Problem List   Diagnosis Date Noted  . Stroke ([place])   . Symptomatic bradycardia 05/06/2018  . Postural dizziness with near syncope 05/06/2018  . Labile blood glucose   . Acute blood loss anemia   . Labile blood pressure   . Stage 3 chronic kidney disease ([place])   . Type 2 diabetes mellitus with peripheral neuropathy (HCC)   . Post-operative pain   . Amputation of left lower extremity below knee upon examination ([place]) 11/24/2017  . Amputated below knee, left ([place]) 11/22/2017  . Osteomyelitis of left foot ([place])   . Dehiscence of amputation stump ([place]) 08/11/2017  . S/P transmetatarsal amputation of foot, left ([place]) 07/19/2017  . Subacute osteomyelitis, left ankle and foot ([place]) 07/17/2017  . Diabetic  ulcer of left foot associated with type 2 diabetes mellitus ([place])   .  Diabetes mellitus with renal complications ([place]) 38/25/0539  . Diabetes ([place]) 10/13/2014  . Anemia 10/13/2014  . CKD (chronic kidney disease) stage 3, GFR 30-59 ml/min (HCC) 10/13/2014  . Hyperkalemia 10/13/2014    Shivansh Hardaway PT, DPT 06/13/2018, 12:12 PM  [facility] [address] [place], [place], [address] Phone: [phone]   Fax:  [phone]  Name: Clinton Dragone MRN: [id number] Date of Birth: 1946/05/31

## 2018-06-18 ENCOUNTER — Ambulatory Visit: Payer: Medicare Other | Attending: Orthopedic Surgery | Admitting: Physical Therapy

## 2018-06-18 DIAGNOSIS — R2689 Other abnormalities of gait and mobility: Secondary | ICD-10-CM | POA: Insufficient documentation

## 2018-06-18 DIAGNOSIS — M6281 Muscle weakness (generalized): Secondary | ICD-10-CM

## 2018-06-18 DIAGNOSIS — R293 Abnormal posture: Secondary | ICD-10-CM | POA: Diagnosis not present

## 2018-06-18 DIAGNOSIS — R2681 Unsteadiness on feet: Secondary | ICD-10-CM | POA: Insufficient documentation

## 2018-06-18 NOTE — Therapy (Signed)
[facility] [address] [place], [place], [address] Phone: [phone]   Fax:  [phone]  Physical Therapy Treatment  Patient Details  Name: Kevin Bradford MRN: [id number] Date of Birth: [birth year] Referring Provider (PT): Meridee Score, MD (cc: GNA Venancio Poisson, NP)   Encounter Date: 06/18/2018  PT End of Session - 06/18/18 1513    Visit Number  28    Number of Visits  42    Date for PT Re-Evaluation  08/10/18    Authorization Type  Medicare & AETNA Medicare 80% AETNA covers 20% once $300 deductible (at eval $91.17 met)    Authorization Time Period  recert sent 44WN visit so PN at 28    PT Start Time  1316    PT Stop Time  1400    PT Time Calculation (min)  44 min    Equipment Utilized During Treatment  Gait belt    Activity Tolerance  Patient tolerated treatment well;Patient limited by fatigue    Behavior During Therapy  [facility] for tasks assessed/performed       Past Medical History:  Diagnosis Date  . Arthritis    "joint pain" (08/16/2017)  . Below knee amputation ([place])   . Cellulitis and abscess of leg 10/18/2014  . CKD (chronic kidney disease) stage 3, GFR 30-59 ml/min (HCC)    creatinine increases with antibiotics per pt, no known kidney disease per patient  . Dehiscence of closure of skin, subsequent encounter   . Diabetic foot ulcer ([place])    left  . Diabetic peripheral neuropathy ([place])   . GERD (gastroesophageal reflux disease)    12/26/2017- "not this year."   . Osteomyelitis ([place])    left transmetatarsal   . Stroke ([place])   . Type II diabetes mellitus ([place])     Past Surgical History:  Procedure Laterality Date  . AMPUTATION Left 07/19/2017   Procedure: LEFT TRANSMETATARSAL AMPUTATION;  Surgeon: Newt Minion, MD;  Location: [facility];  Service: Orthopedics;  Laterality: Left;  . AMPUTATION Left 08/16/2017   REVISION LEFT TRANSMETATARSAL AMPUTATION  . AMPUTATION Left 11/22/2017   Procedure: LEFT BELOW  KNEE AMPUTATION;  Surgeon: Newt Minion, MD;  Location: [facility];  Service: Orthopedics;  Laterality: Left;  . CATARACT EXTRACTION W/ INTRAOCULAR LENS  IMPLANT, BILATERAL  ~ 2016  . EYE SURGERY Bilateral    catarct  . I&D EXTREMITY Left 10/17/2014   Procedure: IRRIGATION AND DEBRIDEMENT EXTREMITY LEFT FOOT;  Surgeon: Newt Minion, MD;  Location: [facility];  Service: Orthopedics;  Laterality: Left;  . IR ANGIO INTRA EXTRACRAN SEL COM CAROTID INNOMINATE BILAT MOD SED  05/07/2018  . IR ANGIO VERTEBRAL SEL SUBCLAVIAN INNOMINATE BILAT MOD SED  05/07/2018  . STUMP REVISION Left 08/16/2017   Procedure: REVISION LEFT TRANSMETATARSAL AMPUTATION;  Surgeon: Newt Minion, MD;  Location: [facility];  Service: Orthopedics;  Laterality: Left;  . STUMP REVISION Left 12/27/2017   Procedure: LEFT BELOW KNEE AMPUTATION REVISION;  Surgeon: Newt Minion, MD;  Location: [facility];  Service: Orthopedics;  Laterality: Left;  . TOE AMPUTATION Right 2013   5th toe    There were no vitals filed for this visit.                    [facility] Adult PT Treatment/Exercise - 06/18/18 1500      Ambulation/Gait   Ambulation/Gait  Yes    Ambulation/Gait Assistance  5: Supervision    Ambulation/Gait Assistance Details  Pt  ambulated with decreased stability due to modification of socket and blister near lateral aspect of tibial platea    Ambulation Distance (Feet)  460 Feet    Assistive device  Prosthesis;None;Straight cane    Gait Pattern  Step-through pattern;Decreased step length - right;Decreased stance time - left;Decreased stride length;Decreased weight shift to left    Ambulation Surface  Level;Indoor    Stairs  Yes    Stairs Assistance  5: Supervision    Stairs Assistance Details (indicate cue type and reason)  Verbal cues given to plantar flex the right LE when descending to help control descent.    Stair Management Technique  One rail Left;Alternating pattern;Forwards    Number of Stairs  4   x4   Pre-Gait  Activities  Pt performed 5 laps at counter top ambulating over smaller hurdles with recipricol gait and occassionally requiring a step to pattern.    Gait Comments  Pt ambulated four laps during treatment with use of straight cane and with AD at times.  Verbal cues given for step length and width.       High Level Balance   High Level Balance Activities  Other (comment)    High Level Balance Comments  Pt performed in corner with chair in front standing balance activtie with horozontal/vertical head turns. Pt progressed standing balance on level ground with feet together and eyes closed. Pt required intermittent use of UE to maintain balance. Pt then performed standing balance on compliant surface with horozontal/vertical head turns progressing to eys closed. pt required seated rest break after balacne actvities.      Prosthetics   Prosthetic Care Comments   Education given to apply new tegaderm in the morning with removal and cleaning at night to let blister air out. Extra tegaderm was given to pt and education was provided to wife and patient. [employer] was called due to recent addition of padding to socket causing lateral tibial plateau friction. [employer] was called and available to see pt after therapy session.    Current prosthetic wear tolerance (days/week)   daily    Current prosthetic wear tolerance (#hours/day)   most awake hours, drying every 3-4 hours    Residual limb condition   Pt has 42m blister on laterl aspect of tibial plateua.    Education Provided  Residual limb care;Ply sock cleaning    Person(s) Educated  Patient;Spouse    Education Method  Explanation;Demonstration             PT Education - 06/18/18 1518    Education Details  Education reguarding limb care with new blister. See prosthetic limb care comments.    Person(s) Educated  Patient    Methods  Explanation    Comprehension  Verbalized understanding       PT Short Term Goals - 06/13/18 1213      PT SHORT TERM  GOAL #1   Title  Patient reports return to exercising without issues. (All updated STGs 07/12/2018)    Time  1    Period  Months    Status  New    Target Date  07/12/18      PT SHORT TERM GOAL #2   Title  Patient ambulates 1000' outdoors on paved surfaces with prosthesis only with supervision.     Time  1    Period  Months    Status  New    Target Date  07/12/18      PT SHORT TERM GOAL #3   Title  Berg Balance >52/56    Time  1    Period  Months    Status  New    Target Date  07/12/18      PT SHORT TERM GOAL #4   Title  Patient negotiates ramps, curbs and stairs with 1 rail with prosthesis only with supervision.     Time  1    Status  New    Target Date  07/12/18        PT Long Term Goals - 06/12/18 1800      PT LONG TERM GOAL #1   Title  Patient demonstrates & verbalizes understanding of prosthetic care & wears prosthesis >90% of awake hours without skin or pain issues to enable safe use of prosthesis. (All LTGs Target Date: 08/10/2018)    Time  3    Period  Months    Status  On-going    Target Date  08/10/17      PT LONG TERM GOAL #2   Title  Patient demonstrates & verbalizes ongoing HEP / fitness plan with medical issues.     Time  3    Period  Months    Status  On-going    Target Date  08/10/17      PT LONG TERM GOAL #3   Title  Patient ambulates >1000' outdoors including grass, ramps & curbs with prosthesis only independently for community mobility.     Time  3    Period  Months    Status  On-going    Target Date  08/10/17      PT LONG TERM GOAL #4   Title  Berg Balance >45/56 to indicate lower fall risk.    Time  3    Period  Months    Status  On-going    Target Date  08/10/17      PT LONG TERM GOAL #5   Title  Patient perform floor transfers, lifts & carries 25# and push/pull to enable to return to yard work & other patient goals.     Time  3    Period  Months    Status  On-going    Target Date  08/10/17      Additional Long Term Goals    Additional Long Term Goals  Yes      PT LONG TERM GOAL #6   Title  Functional Gait Assessment >19/30 to indicate lower fall risk.     Status  New    Target Date  08/10/17            Plan - 06/18/18 1513    Clinical Impression Statement  Treatment focused on limb care due to new blister on residual limb. The remainder of treatment focused on balance and limited gait to not further aggravate skin.  Pt would benefit from further physical therpay to increase balance deficits and improve ambulation without AD>    Clinical Presentation  Evolving    Clinical Decision Making  Moderate    Rehab Potential  Good    PT Frequency  2x / week    PT Duration  12 weeks    PT Treatment/Interventions  ADLs/Self Care Home Management;Gait training;Stair training;Functional mobility training;Therapeutic activities;Therapeutic exercise;DME Instruction;Balance training;Neuromuscular re-education;Patient/family education;Prosthetic Training;Vestibular;Manual techniques;Canalith Repostioning    PT Next Visit Plan  Check skin due to blister on residual limb, continue gait and balance activities.    Consulted and Agree with Plan of Care  Patient    Family Member Consulted  wife,  Mechele Claude Spurrier       Patient will benefit from skilled therapeutic intervention in order to improve the following deficits and impairments:  Abnormal gait, Decreased activity tolerance, Decreased balance, Decreased endurance, Decreased knowledge of use of DME, Decreased mobility, Decreased strength, Dizziness, Impaired flexibility, Postural dysfunction, Prosthetic Dependency  Visit Diagnosis: Other abnormalities of gait and mobility  Muscle weakness (generalized)  Unsteadiness on feet     Problem List Patient Active Problem List   Diagnosis Date Noted  . Stroke ([place])   . Symptomatic bradycardia 05/06/2018  . Postural dizziness with near syncope 05/06/2018  . Labile blood glucose   . Acute blood loss anemia   . Labile  blood pressure   . Stage 3 chronic kidney disease ([place])   . Type 2 diabetes mellitus with peripheral neuropathy (HCC)   . Post-operative pain   . Amputation of left lower extremity below knee upon examination ([place]) 11/24/2017  . Amputated below knee, left ([place]) 11/22/2017  . Osteomyelitis of left foot ([place])   . Dehiscence of amputation stump ([place]) 08/11/2017  . S/P transmetatarsal amputation of foot, left ([place]) 07/19/2017  . Subacute osteomyelitis, left ankle and foot ([place]) 07/17/2017  . Diabetic ulcer of left foot associated with type 2 diabetes mellitus ([place])   . Diabetes mellitus with renal complications ([place]) 72/55/0016  . Diabetes ([place]) 10/13/2014  . Anemia 10/13/2014  . CKD (chronic kidney disease) stage 3, GFR 30-59 ml/min (HCC) 10/13/2014  . Hyperkalemia 10/13/2014    Shaquanda Graves SPTA 06/18/2018, 3:19 PM  [facility] [address] [place], [place], [address] Phone: [phone]   Fax:  [phone]  Name: Kevin Bradford MRN: [id number] Date of Birth: 1946/03/15

## 2018-06-20 ENCOUNTER — Encounter: Payer: Self-pay | Admitting: Physical Therapy

## 2018-06-20 ENCOUNTER — Ambulatory Visit: Payer: Medicare Other | Admitting: Physical Therapy

## 2018-06-20 DIAGNOSIS — R2681 Unsteadiness on feet: Secondary | ICD-10-CM

## 2018-06-20 DIAGNOSIS — R2689 Other abnormalities of gait and mobility: Secondary | ICD-10-CM

## 2018-06-20 DIAGNOSIS — M6281 Muscle weakness (generalized): Secondary | ICD-10-CM

## 2018-06-20 DIAGNOSIS — R293 Abnormal posture: Secondary | ICD-10-CM

## 2018-06-22 NOTE — Therapy (Signed)
[facility] [address] [place], [place], [address] Phone: [phone]   Fax:  [phone]  Physical Therapy Treatment  Patient Details  Name: Kevin Bradford MRN: [id number] Date of Birth: February 04, 1946 Referring Provider (PT): Meridee Score, MD (cc: Farrel Conners Venancio Poisson, NP)   Encounter Date: 06/20/2018   06/20/18 1322  PT Visits / Re-Eval  Visit Number 29  Number of Visits 42  Date for PT Re-Evaluation 08/10/18  Authorization  Authorization Type Medicare & AETNA Medicare 80% AETNA covers 20% once $300 deductible (at eval $91.17 met)  Authorization Time Period recert sent 69GE visit so PN at 28  PT Time Calculation  PT Start Time 1316  PT Stop Time 1400  PT Time Calculation (min) 44 min  PT - End of Session  Equipment Utilized During Treatment Gait belt  Activity Tolerance Patient tolerated treatment well;Patient limited by fatigue  Behavior During Therapy [facility] for tasks assessed/performed      Past Medical History:  Diagnosis Date  . Arthritis    "joint pain" (08/16/2017)  . Below knee amputation ([place])   . Cellulitis and abscess of leg 10/18/2014  . CKD (chronic kidney disease) stage 3, GFR 30-59 ml/min (HCC)    creatinine increases with antibiotics per pt, no known kidney disease per patient  . Dehiscence of closure of skin, subsequent encounter   . Diabetic foot ulcer ([place])    left  . Diabetic peripheral neuropathy ([place])   . GERD (gastroesophageal reflux disease)    12/26/2017- "not this year."   . Osteomyelitis ([place])    left transmetatarsal   . Stroke ([place])   . Type II diabetes mellitus ([place])     Past Surgical History:  Procedure Laterality Date  . AMPUTATION Left 07/19/2017   Procedure: LEFT TRANSMETATARSAL AMPUTATION;  Surgeon: Newt Minion, MD;  Location: [facility];  Service: Orthopedics;  Laterality: Left;  . AMPUTATION Left 08/16/2017   REVISION LEFT TRANSMETATARSAL AMPUTATION  . AMPUTATION Left  11/22/2017   Procedure: LEFT BELOW KNEE AMPUTATION;  Surgeon: Newt Minion, MD;  Location: [facility];  Service: Orthopedics;  Laterality: Left;  . CATARACT EXTRACTION W/ INTRAOCULAR LENS  IMPLANT, BILATERAL  ~ 2016  . EYE SURGERY Bilateral    catarct  . I&D EXTREMITY Left 10/17/2014   Procedure: IRRIGATION AND DEBRIDEMENT EXTREMITY LEFT FOOT;  Surgeon: Newt Minion, MD;  Location: [facility];  Service: Orthopedics;  Laterality: Left;  . IR ANGIO INTRA EXTRACRAN SEL COM CAROTID INNOMINATE BILAT MOD SED  05/07/2018  . IR ANGIO VERTEBRAL SEL SUBCLAVIAN INNOMINATE BILAT MOD SED  05/07/2018  . STUMP REVISION Left 08/16/2017   Procedure: REVISION LEFT TRANSMETATARSAL AMPUTATION;  Surgeon: Newt Minion, MD;  Location: [facility];  Service: Orthopedics;  Laterality: Left;  . STUMP REVISION Left 12/27/2017   Procedure: LEFT BELOW KNEE AMPUTATION REVISION;  Surgeon: Newt Minion, MD;  Location: [facility];  Service: Orthopedics;  Laterality: Left;  . TOE AMPUTATION Right 2013   5th toe    There were no vitals filed for this visit.     06/20/18 1319  Symptoms/Limitations  Subjective Walked about a mile [place] in downtown [place]. Did have the adjustments made to socket after last session. Reports his wife inadvertanly took the Tegaderm with her to [place] (in her purse) so he has not had any on the past 2 days.   Pertinent History L TTA, DM, CKD3, PVD, arthritis, peripheral neuropathy  Limitations Lifting;Standing;Walking;House hold activities  Patient Stated Goals  To use prosthesis to be active fish, yard work, play with grandkids, ride bicycle. Swim in pool.   Pain Assessment  Currently in Pain? No/denies  Pain Score 0      06/20/18 1323  Transfers  Transfers Sit to Stand;Stand to Sit  Sit to Stand 6: Modified independent (Device/Increase time);With upper extremity assist;With armrests;From chair/3-in-1  Stand to Sit 6: Modified independent (Device/Increase time);With upper extremity assist;To  chair/3-in-1  Ambulation/Gait  Ambulation/Gait Yes  Ambulation/Gait Assistance 5: Supervision;4: Min guard  Ambulation/Gait Assistance Details cues on decreased abduction of prosthesis with gait, arm swing and bil step length.   Ambulation Distance (Feet) 100 Feet (x2 with cane in/out gym; around gym no AD)  Assistive device Prosthesis;None;Straight cane  Gait Pattern Step-through pattern;Decreased step length - right;Decreased stance time - left;Decreased stride length;Decreased weight shift to left  Ambulation Surface Level;Indoor  Neuro Re-ed   Neuro Re-ed Details  for balance/NMR: gait with speed changes, enviromental scanning left<>Right, up<>down with prosthesis only. min guard assist. minimal change in gait speed noted.   Prosthetics  Current prosthetic wear tolerance (days/week)  daily  Current prosthetic wear tolerance (#hours/day)  most awake hours, drying every 3-4 hours  Residual limb condition  wound appears to be healing with beginning stages of granulation noted. Covered with Tegaderm to decreased friction from liner due to still open.   Education Provided Residual limb care  Person(s) Educated Patient  Education Method Explanation;Demonstration;Verbal cues;Handout  Education Method Verbalized understanding;Verbal cues required;Tactile cues required;Needs further instruction  Donning Prosthesis 6  Doffing Prosthesis 6       06/20/18 1348  Balance Exercises: Standing  Standing Eyes Closed Narrow base of support (BOS);Wide (BOA);Head turns;Foam/compliant surface;Other reps (comment);30 secs;Limitations  Tandem Gait Forward;Retro;Upper extremity support;Foam/compliant surface;2 reps;Limitations  Sidestepping Foam/compliant support;Upper extremity support;3 reps;Limitations  Other Standing Exercises reciprocal stepping floor<>rockerboard<>8 inch step with march with non stance leg, 5 reps each leg. light UE support on bars with min guard assist, cues on technique and form.    Balance Exercises: Standing  Standing Eyes Closed Limitations on airex in corner with chair in front for safety: wide base of support progressing to narrow base of support for EC no head movements; then with wide base of support EC head movements left<>right, up<>down. min guard assist to min assist for balance.    Tandem Gait Limitations on blue foam beam: 3 laps with light UE support for balance with cues on posture/ex form, min guard assist.   Sidestepping Limitations on blue foam beam: 3 laps toward each side with min guard assist for balance. cues on posture and step length. intermittent light UE support.       PT Short Term Goals - 06/13/18 1213      PT SHORT TERM GOAL #1   Title  Patient reports return to exercising without issues. (All updated STGs 07/12/2018)    Time  1    Period  Months    Status  New    Target Date  07/12/18      PT SHORT TERM GOAL #2   Title  Patient ambulates 1000' outdoors on paved surfaces with prosthesis only with supervision.     Time  1    Period  Months    Status  New    Target Date  07/12/18      PT SHORT TERM GOAL #3   Title  Berg Balance >52/56    Time  1    Period  Months    Status  New    Target Date  07/12/18      PT SHORT TERM GOAL #4   Title  Patient negotiates ramps, curbs and stairs with 1 rail with prosthesis only with supervision.     Time  1    Status  New    Target Date  07/12/18        PT Long Term Goals - 06/12/18 1800      PT LONG TERM GOAL #1   Title  Patient demonstrates & verbalizes understanding of prosthetic care & wears prosthesis >90% of awake hours without skin or pain issues to enable safe use of prosthesis. (All LTGs Target Date: 08/10/2018)    Time  3    Period  Months    Status  On-going    Target Date  08/10/17      PT LONG TERM GOAL #2   Title  Patient demonstrates & verbalizes ongoing HEP / fitness plan with medical issues.     Time  3    Period  Months    Status  On-going    Target Date   08/10/17      PT LONG TERM GOAL #3   Title  Patient ambulates >1000' outdoors including grass, ramps & curbs with prosthesis only independently for community mobility.     Time  3    Period  Months    Status  On-going    Target Date  08/10/17      PT LONG TERM GOAL #4   Title  Berg Balance >45/56 to indicate lower fall risk.    Time  3    Period  Months    Status  On-going    Target Date  08/10/17      PT LONG TERM GOAL #5   Title  Patient perform floor transfers, lifts & carries 25# and push/pull to enable to return to yard work & other patient goals.     Time  3    Period  Months    Status  On-going    Target Date  08/10/17      Additional Long Term Goals   Additional Long Term Goals  Yes      PT LONG TERM GOAL #6   Title  Functional Gait Assessment >19/30 to indicate lower fall risk.     Status  New    Target Date  08/10/17          06/20/18 1323  Plan  Clinical Impression Statement Today's skilled session continued to address balance reactions with no issues reported. The pt is making steady progress toward goals and should benefit from continued PT to progress toward unmet goals.   Pt will benefit from skilled therapeutic intervention in order to improve on the following deficits Abnormal gait;Decreased activity tolerance;Decreased balance;Decreased endurance;Decreased knowledge of use of DME;Decreased mobility;Decreased strength;Dizziness;Impaired flexibility;Postural dysfunction;Prosthetic Dependency  Rehab Potential Good  PT Frequency 2x / week  PT Duration 12 weeks  PT Treatment/Interventions ADLs/Self Care Home Management;Gait training;Stair training;Functional mobility training;Therapeutic activities;Therapeutic exercise;DME Instruction;Balance training;Neuromuscular re-education;Patient/family education;Prosthetic Training;Vestibular;Manual techniques;Canalith Repostioning  PT Next Visit Plan Check skin due to blister on residual limb, continue gait and balance  activities.  Consulted and Agree with Plan of Care Patient  Family Member Consulted wife, Adron Bene         Patient will benefit from skilled therapeutic intervention in order to improve the following deficits and impairments:  Abnormal gait, Decreased activity tolerance, Decreased balance, Decreased endurance, Decreased knowledge of use  of DME, Decreased mobility, Decreased strength, Dizziness, Impaired flexibility, Postural dysfunction, Prosthetic Dependency  Visit Diagnosis: Other abnormalities of gait and mobility  Muscle weakness (generalized)  Unsteadiness on feet  Abnormal posture     Problem List Patient Active Problem List   Diagnosis Date Noted  . Stroke ([place])   . Symptomatic bradycardia 05/06/2018  . Postural dizziness with near syncope 05/06/2018  . Labile blood glucose   . Acute blood loss anemia   . Labile blood pressure   . Stage 3 chronic kidney disease ([place])   . Type 2 diabetes mellitus with peripheral neuropathy (HCC)   . Post-operative pain   . Amputation of left lower extremity below knee upon examination ([place]) 11/24/2017  . Amputated below knee, left ([place]) 11/22/2017  . Osteomyelitis of left foot ([place])   . Dehiscence of amputation stump ([place]) 08/11/2017  . S/P transmetatarsal amputation of foot, left ([place]) 07/19/2017  . Subacute osteomyelitis, left ankle and foot ([place]) 07/17/2017  . Diabetic ulcer of left foot associated with type 2 diabetes mellitus ([place])   . Diabetes mellitus with renal complications ([place]) 59/92/3414  . Diabetes ([place]) 10/13/2014  . Anemia 10/13/2014  . CKD (chronic kidney disease) stage 3, GFR 30-59 ml/min (HCC) 10/13/2014  . Hyperkalemia 10/13/2014    Willow Ora, PTA, [facility] [address], [place], [place] [address] [phone] 06/22/18, 10:39 AM   Name: Kevin Bradford MRN: [id number] Date of Birth: 01/18/1946

## 2018-06-25 ENCOUNTER — Ambulatory Visit: Payer: Medicare Other | Admitting: Physical Therapy

## 2018-06-25 DIAGNOSIS — R2689 Other abnormalities of gait and mobility: Secondary | ICD-10-CM

## 2018-06-25 DIAGNOSIS — R2681 Unsteadiness on feet: Secondary | ICD-10-CM

## 2018-06-25 DIAGNOSIS — M6281 Muscle weakness (generalized): Secondary | ICD-10-CM | POA: Diagnosis not present

## 2018-06-25 DIAGNOSIS — R293 Abnormal posture: Secondary | ICD-10-CM | POA: Diagnosis not present

## 2018-06-25 NOTE — Therapy (Signed)
[facility] [address] [place], [place], [address] Phone: [phone]   Fax:  [phone]  Physical Therapy Treatment  Patient Details  Name: Kevin Bradford MRN: [id number] Date of Birth: 1946/02/15 Referring Provider (PT): Meridee Score, MD (cc: [facility] Venancio Poisson, NP)   Encounter Date: 06/25/2018  PT End of Session - 06/25/18 1514    Visit Number  30    Number of Visits  42    Date for PT Re-Evaluation  08/10/18    Authorization Type  Medicare & AETNA Medicare 80% AETNA covers 20% once $300 deductible (at eval $91.17 met)    Authorization Time Period  recert sent 65KC visit so PN at 28    PT Start Time  1316    PT Stop Time  1400    PT Time Calculation (min)  44 min    Equipment Utilized During Treatment  Gait belt    Activity Tolerance  Patient tolerated treatment well;Patient limited by fatigue    Behavior During Therapy  [facility] for tasks assessed/performed       Past Medical History:  Diagnosis Date  . Arthritis    "joint pain" (08/16/2017)  . Below knee amputation ([place])   . Cellulitis and abscess of leg 10/18/2014  . CKD (chronic kidney disease) stage 3, GFR 30-59 ml/min (HCC)    creatinine increases with antibiotics per pt, no known kidney disease per patient  . Dehiscence of closure of skin, subsequent encounter   . Diabetic foot ulcer ([place])    left  . Diabetic peripheral neuropathy ([place])   . GERD (gastroesophageal reflux disease)    12/26/2017- "not this year."   . Osteomyelitis ([place])    left transmetatarsal   . Stroke ([place])   . Type II diabetes mellitus ([place])     Past Surgical History:  Procedure Laterality Date  . AMPUTATION Left 07/19/2017   Procedure: LEFT TRANSMETATARSAL AMPUTATION;  Surgeon: Newt Minion, MD;  Location: [facility];  Service: Orthopedics;  Laterality: Left;  . AMPUTATION Left 08/16/2017   REVISION LEFT TRANSMETATARSAL AMPUTATION  . AMPUTATION Left 11/22/2017   Procedure: LEFT BELOW  KNEE AMPUTATION;  Surgeon: Newt Minion, MD;  Location: [facility];  Service: Orthopedics;  Laterality: Left;  . CATARACT EXTRACTION W/ INTRAOCULAR LENS  IMPLANT, BILATERAL  ~ 2016  . EYE SURGERY Bilateral    catarct  . I&D EXTREMITY Left 10/17/2014   Procedure: IRRIGATION AND DEBRIDEMENT EXTREMITY LEFT FOOT;  Surgeon: Newt Minion, MD;  Location: [facility];  Service: Orthopedics;  Laterality: Left;  . IR ANGIO INTRA EXTRACRAN SEL COM CAROTID INNOMINATE BILAT MOD SED  05/07/2018  . IR ANGIO VERTEBRAL SEL SUBCLAVIAN INNOMINATE BILAT MOD SED  05/07/2018  . STUMP REVISION Left 08/16/2017   Procedure: REVISION LEFT TRANSMETATARSAL AMPUTATION;  Surgeon: Newt Minion, MD;  Location: [facility];  Service: Orthopedics;  Laterality: Left;  . STUMP REVISION Left 12/27/2017   Procedure: LEFT BELOW KNEE AMPUTATION REVISION;  Surgeon: Newt Minion, MD;  Location: [facility];  Service: Orthopedics;  Laterality: Left;  . TOE AMPUTATION Right 2013   5th toe    There were no vitals filed for this visit.  Subjective Assessment - 06/25/18 1504    Subjective  Pt hsa started taking supplemental vitamins, one being vitamin D. He has not had any falls. He has been thinking about starting to exercise more on his own time. Pt was in [place] and cleaned gutters while on a ladder aand blowing  leaves out of the lawn without issue.    Patient is accompained by:  Family member    Pertinent History  L TTA, DM, CKD3, PVD, arthritis, peripheral neuropathy    Limitations  Lifting;Standing;Walking;House hold activities    Patient Stated Goals  To use prosthesis to be active fish, yard work, play with grandkids, ride bicycle. Swim in pool.     Currently in Pain?  No/denies           [facility] Adult PT Treatment/Exercise - 06/25/18 1325      Transfers   Transfers  Sit to Stand;Stand to Sit    Sit to Stand  6: Modified independent (Device/Increase time);With upper extremity assist;With armrests;From chair/3-in-1    Stand to Sit  6:  Modified independent (Device/Increase time);With upper extremity assist;To chair/3-in-1      Ambulation/Gait   Ambulation/Gait  Yes    Ambulation/Gait Assistance  6: Modified independent (Device/Increase time)    Ambulation/Gait Assistance Details  Use with and without cane    Ambulation Distance (Feet)  330 Feet   x2 with cane in/out gym; around gym no AD   Assistive device  Prosthesis;None;Straight cane    Gait Pattern  Step-through pattern;Decreased step length - right;Decreased stance time - left;Decreased stride length;Decreased weight shift to left    Ambulation Surface  Level;Indoor    Gait Comments  Performed gait at end of treatment to increase activity tolerance. Pt ambulated with first lap with cane and the rest without.       High Level Balance   High Level Balance Activities  Other (comment)    High Level Balance Comments  Pt performed in // bars: semi tandem standing balance with alternating foot placement with use of UE assits; he performed ROWs and SAPD x10 reps with focus on posture and balance, pt performed stepping strategy while standing on compliant surface fwd/retro x10 each; pt standing marches while on compliant surface with UE assist.       Neuro Re-ed    Neuro Re-ed Details   --      Prosthetics   Prosthetic Care Comments   Pt educated that changes to the prosthetic will feel different and take time to adjust but there should not be discomfort or skin break down. Educated pt to continue to monitor skin and grdual increase in activity.    Current prosthetic wear tolerance (days/week)   daily    Current prosthetic wear tolerance (#hours/day)   most awake hours, drying every 3-4 hours    Residual limb condition   Pt reports no discomfort and skin is mostly healed. he no longer requires tegaderm.    Education Provided  Residual limb care    Person(s) Educated  Patient    Education Method  Explanation    Education Method  Verbalized understanding;Needs further  instruction             PT Education - 06/25/18 1513    Education Details  Pt educated on intiating exercises. SPTA suggested he begin using his stationary bicycle to increase activity tolerance.    Person(s) Educated  Patient    Methods  Explanation    Comprehension  Verbalized understanding       PT Short Term Goals - 06/13/18 1213      PT SHORT TERM GOAL #1   Title  Patient reports return to exercising without issues. (All updated STGs 07/12/2018)    Time  1    Period  Months    Status  New    Target Date  07/12/18      PT SHORT TERM GOAL #2   Title  Patient ambulates 1000' outdoors on paved surfaces with prosthesis only with supervision.     Time  1    Period  Months    Status  New    Target Date  07/12/18      PT SHORT TERM GOAL #3   Title  Berg Balance >52/56    Time  1    Period  Months    Status  New    Target Date  07/12/18      PT SHORT TERM GOAL #4   Title  Patient negotiates ramps, curbs and stairs with 1 rail with prosthesis only with supervision.     Time  1    Status  New    Target Date  07/12/18        PT Long Term Goals - 06/12/18 1800      PT LONG TERM GOAL #1   Title  Patient demonstrates & verbalizes understanding of prosthetic care & wears prosthesis >90% of awake hours without skin or pain issues to enable safe use of prosthesis. (All LTGs Target Date: 08/10/2018)    Time  3    Period  Months    Status  On-going    Target Date  08/10/17      PT LONG TERM GOAL #2   Title  Patient demonstrates & verbalizes ongoing HEP / fitness plan with medical issues.     Time  3    Period  Months    Status  On-going    Target Date  08/10/17      PT LONG TERM GOAL #3   Title  Patient ambulates >1000' outdoors including grass, ramps & curbs with prosthesis only independently for community mobility.     Time  3    Period  Months    Status  On-going    Target Date  08/10/17      PT LONG TERM GOAL #4   Title  Berg Balance >45/56 to indicate  lower fall risk.    Time  3    Period  Months    Status  On-going    Target Date  08/10/17      PT LONG TERM GOAL #5   Title  Patient perform floor transfers, lifts & carries 25# and push/pull to enable to return to yard work & other patient goals.     Time  3    Period  Months    Status  On-going    Target Date  08/10/17      Additional Long Term Goals   Additional Long Term Goals  Yes      PT LONG TERM GOAL #6   Title  Functional Gait Assessment >19/30 to indicate lower fall risk.     Status  New    Target Date  08/10/17            Plan - 06/25/18 1514    Clinical Impression Statement  Treatment today focused on balance activites and education  with iniation of exercising at home with gradual increases. Pt would benefit from further physcial therapy to increase balance and activity tolerance.    Clinical Presentation  Evolving    Clinical Decision Making  Moderate    Rehab Potential  Good    PT Frequency  2x / week    PT Duration  12 weeks  PT Treatment/Interventions  ADLs/Self Care Home Management;Gait training;Stair training;Functional mobility training;Therapeutic activities;Therapeutic exercise;DME Instruction;Balance training;Neuromuscular re-education;Patient/family education;Prosthetic Training;Vestibular;Manual techniques;Canalith Repostioning    PT Next Visit Plan  Work towards [employer] and Agree with Plan of Care  Patient       Patient will benefit from skilled therapeutic intervention in order to improve the following deficits and impairments:  Abnormal gait, Decreased activity tolerance, Decreased balance, Decreased endurance, Decreased knowledge of use of DME, Decreased mobility, Decreased strength, Dizziness, Impaired flexibility, Postural dysfunction, Prosthetic Dependency  Visit Diagnosis: Unsteadiness on feet  Other abnormalities of gait and mobility     Problem List Patient Active Problem List   Diagnosis Date Noted  . Stroke ([place])    . Symptomatic bradycardia 05/06/2018  . Postural dizziness with near syncope 05/06/2018  . Labile blood glucose   . Acute blood loss anemia   . Labile blood pressure   . Stage 3 chronic kidney disease ([place])   . Type 2 diabetes mellitus with peripheral neuropathy (HCC)   . Post-operative pain   . Amputation of left lower extremity below knee upon examination ([place]) 11/24/2017  . Amputated below knee, left ([place]) 11/22/2017  . Osteomyelitis of left foot ([place])   . Dehiscence of amputation stump ([place]) 08/11/2017  . S/P transmetatarsal amputation of foot, left ([place]) 07/19/2017  . Subacute osteomyelitis, left ankle and foot ([place]) 07/17/2017  . Diabetic ulcer of left foot associated with type 2 diabetes mellitus ([place])   . Diabetes mellitus with renal complications ([place]) 16/38/4665  . Diabetes ([place]) 10/13/2014  . Anemia 10/13/2014  . CKD (chronic kidney disease) stage 3, GFR 30-59 ml/min (HCC) 10/13/2014  . Hyperkalemia 10/13/2014    Kevin Bradford SPTA 06/25/2018, 3:17 PM  [facility] [address] [place], [place], [address] Phone: [phone]   Fax:  [phone]  Name: Kevin Bradford MRN: [id number] Date of Birth: August 27, 1945

## 2018-06-27 ENCOUNTER — Ambulatory Visit: Payer: Medicare Other | Admitting: Physical Therapy

## 2018-06-27 ENCOUNTER — Encounter: Payer: Self-pay | Admitting: Physical Therapy

## 2018-06-27 DIAGNOSIS — R2689 Other abnormalities of gait and mobility: Secondary | ICD-10-CM

## 2018-06-27 DIAGNOSIS — M6281 Muscle weakness (generalized): Secondary | ICD-10-CM | POA: Diagnosis not present

## 2018-06-27 DIAGNOSIS — R2681 Unsteadiness on feet: Secondary | ICD-10-CM | POA: Diagnosis not present

## 2018-06-27 DIAGNOSIS — R293 Abnormal posture: Secondary | ICD-10-CM | POA: Diagnosis not present

## 2018-06-28 NOTE — Therapy (Addendum)
[facility] [address] [place], [place], [address] Phone: [phone]   Fax:  [phone]  Physical Therapy Treatment & Discharge Summary  Patient Details  Name: Kevin Bradford MRN: [id number] Date of Birth: [birth year] Referring Provider (PT): Meridee Score, MD (cc: [facility] Venancio Poisson, NP)   Encounter Date: 06/27/2018  PT End of Session - 06/27/18 1321    Visit Number  31    Number of Visits  42    Date for PT Re-Evaluation  08/10/18    Authorization Type  Medicare & AETNA Medicare 80% AET[employer] covers 20% once $300 deductible (at eval $91.17 met)    Authorization Time Period  recert sent 72QA visit so PN at 28    PT Start Time  1318    PT Stop Time  1400    PT Time Calculation (min)  42 min    Equipment Utilized During Treatment  Gait belt    Activity Tolerance  Patient tolerated treatment well    Behavior During Therapy  [facility] for tasks assessed/performed       Past Medical History:  Diagnosis Date  . Arthritis    "joint pain" (08/16/2017)  . Below knee amputation ([place])   . Cellulitis and abscess of leg 10/18/2014  . CKD (chronic kidney disease) stage 3, GFR 30-59 ml/min (HCC)    creatinine increases with antibiotics per pt, no known kidney disease per patient  . Dehiscence of closure of skin, subsequent encounter   . Diabetic foot ulcer ([place])    left  . Diabetic peripheral neuropathy ([place])   . GERD (gastroesophageal reflux disease)    12/26/2017- "not this year."   . Osteomyelitis ([place])    left transmetatarsal   . Stroke ([place])   . Type II diabetes mellitus ([place])     Past Surgical History:  Procedure Laterality Date  . AMPUTATION Left 07/19/2017   Procedure: LEFT TRANSMETATARSAL AMPUTATION;  Surgeon: Newt Minion, MD;  Location: [facility];  Service: Orthopedics;  Laterality: Left;  . AMPUTATION Left 08/16/2017   REVISION LEFT TRANSMETATARSAL AMPUTATION  . AMPUTATION Left 11/22/2017   Procedure: LEFT BELOW KNEE  AMPUTATION;  Surgeon: Newt Minion, MD;  Location: [facility];  Service: Orthopedics;  Laterality: Left;  . CATARACT EXTRACTION W/ INTRAOCULAR LENS  IMPLANT, BILATERAL  ~ 2016  . EYE SURGERY Bilateral    catarct  . I&D EXTREMITY Left 10/17/2014   Procedure: IRRIGATION AND DEBRIDEMENT EXTREMITY LEFT FOOT;  Surgeon: Newt Minion, MD;  Location: [facility];  Service: Orthopedics;  Laterality: Left;  . IR ANGIO INTRA EXTRACRAN SEL COM CAROTID INNOMINATE BILAT MOD SED  05/07/2018  . IR ANGIO VERTEBRAL SEL SUBCLAVIAN INNOMINATE BILAT MOD SED  05/07/2018  . STUMP REVISION Left 08/16/2017   Procedure: REVISION LEFT TRANSMETATARSAL AMPUTATION;  Surgeon: Newt Minion, MD;  Location: [facility];  Service: Orthopedics;  Laterality: Left;  . STUMP REVISION Left 12/27/2017   Procedure: LEFT BELOW KNEE AMPUTATION REVISION;  Surgeon: Newt Minion, MD;  Location: [facility];  Service: Orthopedics;  Laterality: Left;  . TOE AMPUTATION Right 2013   5th toe    There were no vitals filed for this visit.  Subjective Assessment - 06/27/18 1320    Subjective  No new complaitns. Has been back on the stationary bike at home, has not tried the actual bike. No falls or pain to report.     Pertinent History  L TTA, DM, CKD3, PVD, arthritis, peripheral neuropathy  Limitations  Lifting;Standing;Walking;House hold activities    Patient Stated Goals  To use prosthesis to be active fish, yard work, play with grandkids, ride bicycle. Swim in pool.     Currently in Pain?  No/denies         [facility] PT Assessment - 06/27/18 1324      Transfers   Transfers  Sit to Stand;Stand to Sit    Sit to Stand  6: Modified independent (Device/Increase time);With upper extremity assist;With armrests;From chair/3-in-1    Stand to Sit  6: Modified independent (Device/Increase time);With upper extremity assist;To chair/3-in-1      Ambulation/Gait   Ambulation/Gait  Yes    Ambulation/Gait Assistance  6: Modified independent (Device/Increase time)     Ambulation Distance (Feet)  800 Feet   x1   Assistive device  Prosthesis;None;Straight cane    Gait Pattern  Step-through pattern;Decreased step length - right;Decreased stance time - left;Decreased stride length;Decreased weight shift to left    Ambulation Surface  Level;Unlevel;Indoor;Outdoor;Paved      Berg Balance Test   Sit to Stand  Able to stand without using hands and stabilize independently    Standing Unsupported  Able to stand safely 2 minutes    Sitting with Back Unsupported but Feet Supported on Floor or Stool  Able to sit safely and securely 2 minutes    Stand to Sit  Sits safely with minimal use of hands    Transfers  Able to transfer safely, minor use of hands    Standing Unsupported with Eyes Closed  Able to stand 10 seconds safely    Standing Ubsupported with Feet Together  Able to place feet together independently and stand 1 minute safely    From Standing, Reach Forward with Outstretched Arm  Can reach confidently >25 cm (10")    From Standing Position, Pick up Object from Floor  Able to pick up shoe safely and easily    From Standing Position, Turn to Look Behind Over each Shoulder  Looks behind from both sides and weight shifts well    Turn 360 Degrees  Able to turn 360 degrees safely in 4 seconds or less    Standing Unsupported, Alternately Place Feet on Step/Stool  Able to stand independently and safely and complete 8 steps in 20 seconds   18.32 sec's   Standing Unsupported, One Foot in Front  Able to plae foot ahead of the other independently and hold 30 seconds    Standing on One Leg  Able to lift leg independently and hold equal to or more than 3 seconds    Total Score  53      Functional Gait  Assessment   Gait assessed   Yes    Gait Level Surface  Walks 20 ft in less than 7 sec but greater than 5.5 sec, uses assistive device, slower speed, mild gait deviations, or deviates 6-10 in outside of the 12 in walkway width.   6.32 sec's   Change in Gait Speed  Able to  smoothly change walking speed without loss of balance or gait deviation. Deviate no more than 6 in outside of the 12 in walkway width.    Gait with Horizontal Head Turns  Performs head turns smoothly with no change in gait. Deviates no more than 6 in outside 12 in walkway width    Gait with Vertical Head Turns  Performs head turns with no change in gait. Deviates no more than 6 in outside 12 in walkway width.  Gait and Pivot Turn  Pivot turns safely within 3 sec and stops quickly with no loss of balance.    Step Over Obstacle  Is able to step over one shoe box (4.5 in total height) without changing gait speed. No evidence of imbalance.    Gait with Narrow Base of Support  Ambulates less than 4 steps heel to toe or cannot perform without assistance.    Gait with Eyes Closed  Walks 20 ft, uses assistive device, slower speed, mild gait deviations, deviates 6-10 in outside 12 in walkway width. Ambulates 20 ft in less than 9 sec but greater than 7 sec.    Ambulating Backwards  Walks 20 ft, no assistive devices, good speed, no evidence for imbalance, normal gait    Steps  Alternating feet, must use rail.    Total Score  23            PT Short Term Goals - 06/27/18 1323      PT SHORT TERM GOAL #1   Title  Patient reports return to exercising without issues. (All updated STGs 07/12/2018)    Baseline  06/27/18: had begun to use bike and machines at home     Status  Achieved      PT SHORT TERM GOAL #2   Title  Patient ambulates 1000' outdoors on paved surfaces with prosthesis only with supervision.     Baseline  06/27/18: 800 feet today with intermittent use of cane Mod I. distance limited outdoors due to weather conditions.     Time  --    Period  --    Status  Partially Met      PT SHORT TERM GOAL #3   Title  Berg Balance >52/56    Baseline  06/27/18: 53/56 scored today    Time  --    Period  --    Status  Achieved      PT SHORT TERM GOAL #4   Title  Patient negotiates ramps, curbs  and stairs with 1 rail with prosthesis only with supervision.     Baseline  06/27/18: Mod I with use of cane on curbs, no AD on ramp, one rail with stairs    Time  --    Status  Partially Met        PT Long Term Goals - 06/27/18 1323      PT LONG TERM GOAL #1   Title  Patient demonstrates & verbalizes understanding of prosthetic care & wears prosthesis >90% of awake hours without skin or pain issues to enable safe use of prosthesis. (All LTGs Target Date: 08/10/2018)    Baseline  06/27/18: met to date    Status  Achieved      PT LONG TERM GOAL #2   Title  Patient demonstrates & verbalizes ongoing HEP / fitness plan with medical issues.     Baseline  06/27/18: met. pt has fitness equipement at home he uses. plans to resume golf in fall.     Status  Achieved      PT LONG TERM GOAL #3   Title  Patient ambulates >1000' outdoors including grass, ramps & curbs with prosthesis only independently for community mobility.     Baseline  06/27/18: 800 feet Mod I with intermittent use of cane (mostly carries it off ground)    Time  --    Period  --    Status  Partially Met      PT LONG TERM GOAL #4  Title  Berg Balance >45/56 to indicate lower fall risk.    Baseline  06/27/18: 53/56 scored today    Time  --    Period  --    Status  Achieved      PT LONG TERM GOAL #5   Title  Patient perform floor transfers, lifts & carries 25# and push/pull to enable to return to yard work & other patient goals.     Baseline  06/27/18: verbally discussed this goal due to time constraints. Pt reports no issues with lifting/carrying items up to 50 pounds at home, has returned to yard work with no issues.     Time  --    Period  --    Status  Achieved      PT LONG TERM GOAL #6   Title  Functional Gait Assessment >19/30 to indicate lower fall risk.     Baseline  06/27/18: 23/30 scored todayi    Status  Achieved            Plan - 06/27/18 1322    Clinical Impression Statement  Discussed pt's  progress with primary PT prior to session. Was determined by PT to look at LTGs to see if pt is ready for discharge. Upon talking to pt in session he was in agreement with this plan as he feels ready to be done with PT. STG/LTGs checked with pt partially met to fully met. Pt is in agreement with discharge today.     Rehab Potential  Good    PT Frequency  2x / week    PT Duration  12 weeks    PT Treatment/Interventions  ADLs/Self Care Home Management;Gait training;Stair training;Functional mobility training;Therapeutic activities;Therapeutic exercise;DME Instruction;Balance training;Neuromuscular re-education;Patient/family education;Prosthetic Training;Vestibular;Manual techniques;Canalith Repostioning    PT Next Visit Plan  discharge.     Consulted and Agree with Plan of Care  Patient       Patient will benefit from skilled therapeutic intervention in order to improve the following deficits and impairments:  Abnormal gait, Decreased activity tolerance, Decreased balance, Decreased endurance, Decreased knowledge of use of DME, Decreased mobility, Decreased strength, Dizziness, Impaired flexibility, Postural dysfunction, Prosthetic Dependency  Visit Diagnosis: Unsteadiness on feet  Other abnormalities of gait and mobility  Muscle weakness (generalized)     Problem List Patient Active Problem List   Diagnosis Date Noted  . Stroke ([place])   . Symptomatic bradycardia 05/06/2018  . Postural dizziness with near syncope 05/06/2018  . Labile blood glucose   . Acute blood loss anemia   . Labile blood pressure   . Stage 3 chronic kidney disease ([place])   . Type 2 diabetes mellitus with peripheral neuropathy (HCC)   . Post-operative pain   . Amputation of left lower extremity below knee upon examination ([place]) 11/24/2017  . Amputated below knee, left ([place]) 11/22/2017  . Osteomyelitis of left foot ([place])   . Dehiscence of amputation stump ([place]) 08/11/2017  . S/P transmetatarsal amputation of  foot, left ([place]) 07/19/2017  . Subacute osteomyelitis, left ankle and foot ([place]) 07/17/2017  . Diabetic ulcer of left foot associated with type 2 diabetes mellitus ([place])   . Diabetes mellitus with renal complications ([place]) 64/40/3474  . Diabetes ([place]) 10/13/2014  . Anemia 10/13/2014  . CKD (chronic kidney disease) stage 3, GFR 30-59 ml/min (HCC) 10/13/2014  . Hyperkalemia 10/13/2014    Willow Ora, PTA, [facility] [address], [place], [place] [address] [phone] 06/28/18, 10:34 AM   Name: Arvle  Benney MRN: [id number] Date of Birth: 1946-06-04    PHYSICAL THERAPY DISCHARGE SUMMARY  Visits from Start of Care: 31  Current functional level related to goals / functional outcomes: See above   Remaining deficits: See above   Education / Equipment: Prosthetic care & ongoing HEP  Plan: Patient agrees to discharge.  Patient goals were met. Patient is being discharged due to meeting the stated rehab goals.  ?????         Jamey Reas, PT, DPT PT Specializing in [place] 06/28/18 2:35 PM Phone:  [phone]  Fax:  [phone] [facility] [address] [place], [place] [address]

## 2018-07-02 ENCOUNTER — Encounter: Payer: Self-pay | Admitting: Physical Therapy

## 2018-07-04 ENCOUNTER — Encounter: Payer: Self-pay | Admitting: Physical Therapy

## 2018-07-09 ENCOUNTER — Encounter: Payer: Self-pay | Admitting: Physical Therapy

## 2018-07-09 DIAGNOSIS — Z794 Long term (current) use of insulin: Secondary | ICD-10-CM | POA: Diagnosis not present

## 2018-07-09 DIAGNOSIS — E1140 Type 2 diabetes mellitus with diabetic neuropathy, unspecified: Secondary | ICD-10-CM | POA: Diagnosis not present

## 2018-07-09 DIAGNOSIS — E1122 Type 2 diabetes mellitus with diabetic chronic kidney disease: Secondary | ICD-10-CM | POA: Diagnosis not present

## 2018-07-09 DIAGNOSIS — N183 Chronic kidney disease, stage 3 (moderate): Secondary | ICD-10-CM | POA: Diagnosis not present

## 2018-07-09 DIAGNOSIS — E11621 Type 2 diabetes mellitus with foot ulcer: Secondary | ICD-10-CM | POA: Diagnosis not present

## 2018-07-09 DIAGNOSIS — I639 Cerebral infarction, unspecified: Secondary | ICD-10-CM | POA: Diagnosis not present

## 2018-07-13 ENCOUNTER — Encounter: Payer: Self-pay | Admitting: Physical Therapy

## 2018-07-16 ENCOUNTER — Ambulatory Visit: Payer: PRIVATE HEALTH INSURANCE | Admitting: Physical Therapy

## 2018-07-17 ENCOUNTER — Ambulatory Visit: Payer: PRIVATE HEALTH INSURANCE | Admitting: Physical Therapy

## 2018-07-23 ENCOUNTER — Ambulatory Visit: Payer: PRIVATE HEALTH INSURANCE | Admitting: Physical Therapy

## 2018-07-25 ENCOUNTER — Ambulatory Visit: Payer: PRIVATE HEALTH INSURANCE | Admitting: Physical Therapy

## 2018-07-30 ENCOUNTER — Ambulatory Visit: Payer: PRIVATE HEALTH INSURANCE | Admitting: Physical Therapy

## 2018-08-01 ENCOUNTER — Ambulatory Visit: Payer: PRIVATE HEALTH INSURANCE | Admitting: Physical Therapy

## 2018-08-06 ENCOUNTER — Ambulatory Visit: Payer: PRIVATE HEALTH INSURANCE | Admitting: Physical Therapy

## 2018-08-08 ENCOUNTER — Ambulatory Visit: Payer: PRIVATE HEALTH INSURANCE | Admitting: Physical Therapy

## 2018-08-17 DIAGNOSIS — I639 Cerebral infarction, unspecified: Secondary | ICD-10-CM | POA: Diagnosis not present

## 2018-08-17 DIAGNOSIS — E1140 Type 2 diabetes mellitus with diabetic neuropathy, unspecified: Secondary | ICD-10-CM | POA: Diagnosis not present

## 2018-08-17 DIAGNOSIS — E1122 Type 2 diabetes mellitus with diabetic chronic kidney disease: Secondary | ICD-10-CM | POA: Diagnosis not present

## 2018-08-17 DIAGNOSIS — E11621 Type 2 diabetes mellitus with foot ulcer: Secondary | ICD-10-CM | POA: Diagnosis not present

## 2018-08-17 DIAGNOSIS — Z794 Long term (current) use of insulin: Secondary | ICD-10-CM | POA: Diagnosis not present

## 2018-08-17 DIAGNOSIS — N183 Chronic kidney disease, stage 3 (moderate): Secondary | ICD-10-CM | POA: Diagnosis not present

## 2018-09-04 ENCOUNTER — Ambulatory Visit (INDEPENDENT_AMBULATORY_CARE_PROVIDER_SITE_OTHER): Payer: Medicare Other | Admitting: Adult Health

## 2018-09-04 ENCOUNTER — Encounter: Payer: Self-pay | Admitting: Adult Health

## 2018-09-04 VITALS — BP 115/67 | HR 74 | Wt 215.2 lb

## 2018-09-04 DIAGNOSIS — E1159 Type 2 diabetes mellitus with other circulatory complications: Secondary | ICD-10-CM | POA: Diagnosis not present

## 2018-09-04 DIAGNOSIS — E785 Hyperlipidemia, unspecified: Secondary | ICD-10-CM

## 2018-09-04 DIAGNOSIS — I63531 Cerebral infarction due to unspecified occlusion or stenosis of right posterior cerebral artery: Secondary | ICD-10-CM

## 2018-09-04 DIAGNOSIS — Z794 Long term (current) use of insulin: Secondary | ICD-10-CM

## 2018-09-04 NOTE — Patient Instructions (Signed)
Continue clopidogrel 75 mg daily  and lipitor  for secondary stroke prevention  Continue to follow up with PCP regarding cholesterol, blood pressure and diabetes management   contiue to stay active and maintain a healthy diet  Follow up with Dr. Corliss Skains as scheduled   Continue to monitor blood pressure at home  Maintain strict control of hypertension with blood pressure goal below 130/90, diabetes with hemoglobin A1c goal below 6.5% and cholesterol with LDL cholesterol (bad cholesterol) goal below 70 mg/dL. I also advised the patient to eat a healthy diet with plenty of whole grains, cereals, fruits and vegetables, exercise regularly and maintain ideal body weight.  Followup in the future with me as needed or call earlier if needed       Thank you for coming to see [place] at [facility]. I hope we have been able to provide you high quality care today.  You may receive a patient satisfaction survey over the next few weeks. We would appreciate your feedback and comments so that we may continue to improve ourselves and the health of our patients.

## 2018-09-04 NOTE — Progress Notes (Signed)
I agree with the above plan 

## 2018-09-04 NOTE — Progress Notes (Signed)
[facility] [address] [place]. [place] [address] [phone]       OFFICE FOLLOW UP NOTE  Mr. Kevin Bradford Date of Birth:  June 05, 1946 Medical Record Number:  [id number]   Reason for Referral:  hospital stroke follow up  CHIEF COMPLAINT:  Chief Complaint  Patient presents with  . 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 History obtained from patient, wife and chart review. Reviewed all radiology images and labs personally.  Mr. Kevin Bradford is a 73 y.o. male with history of type II DB, DB neuropathy, L BKA, CKD  who presented with acute onset gait instability, nausea and dizziness and possibly mild R facial droop and leaning towards the right.  MRI brain reviewed and showed multiple small infarct right middle and mostly right cerebellum.  MRA head and neck showed a right V3 and V4 loss of flow.  Patient did undergo cerebral angiogram which showed occluded right ICA at C1, approximately 60% stenosis dominant left VA origin and approximate 60% stenosis origin of right VA and recommended to follow-up with Dr. Corliss Skains as outpatient.  2D echo showed an EF of 60 to 65% without cardiac source of embolus.  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  Patient was on aspirin 81 mg PTA and recommended DAPT for 3 months with aspirin 325 mg and Plavix 75 mg daily and then Plavix alone.  LDL 130 and recommended initiating atorvastatin 40 mg daily.  A1c 8.2 and recommended tight glycemic control with close PCP follow-up for diabetic management.  Patient was discharged in stable condition with recommendations of outpatient PT/OT.  Patient is being seen today for hospital follow-up and is accompanied by his wife.   Overall he is been doing well with only residual deficit of intermittent dizziness and balance difficulties.  He did did return to the ED 1 week after discharge as he was improving a couple days after discharge but then experienced increased dizziness and leaning towards the right.  MRI brain obtained which did not show any acute abnormality.  He continues to work with PT at neuro rehab.  Continues to take both aspirin and Plavix without side effects of bleeding or bruising.  Continues to take Lipitor without side effects of myalgias.  Blood pressure today 116/66.  He has returned back to all prior activities without complications.  He will be following up with Dr. Corliss Skains for repeat carotid ultrasound in approximately 5 months.  Denies new or worsening stroke/TIA symptoms.  Interval history 09/04/2018: Mr. Kevin Bradford is being seen today for follow-up visit and is accompanied by his wife.  He has recovered well from a stroke standpoint without residual deficits or recurring of symptoms.  He continues on Plavix alone as he has completed 71-month DAPT without side effects of bleeding or bruising.  Continues on atorvastatin without side effects myalgias.  Glucose levels have been stable.  Blood pressure today satisfactory 115/67.  He plans on following up with Dr. Corliss Skains around 11/2018 for surveillance monitoring of stenosis.  Denies new or worsening stroke/TIA symptoms.    ROS:   14 system review of systems performed and negative with exception of see HPI  PMH:  Past Medical History:  Diagnosis Date  . Arthritis    "joint pain" (08/16/2017)  . Below knee amputation (HCC)   . Cellulitis and abscess of leg 10/18/2014  . CKD (chronic kidney disease) stage 3, GFR 30-59 ml/min (HCC)    creatinine increases with antibiotics per pt, no known kidney disease per patient  . Dehiscence of closure of skin, subsequent encounter   . Diabetic foot ulcer (HCC)    left  . Diabetic peripheral neuropathy (HCC)   . GERD  (gastroesophageal reflux disease)    12/26/2017- "not this year."   . Osteomyelitis (HCC)    left transmetatarsal   . Stroke (HCC)   . Type II diabetes mellitus (HCC)     PSH:  Past Surgical History:  Procedure Laterality Date  . AMPUTATION Left 07/19/2017   Procedure: LEFT TRANSMETATARSAL AMPUTATION;  Surgeon: Nadara Mustarduda, Marcus V, MD;  [facility];  Service: Orthopedics;  Laterality: Left;  . AMPUTATION Left 08/16/2017   REVISION LEFT TRANSMETATARSAL AMPUTATION  . AMPUTATION Left 11/22/2017   Procedure: LEFT BELOW KNEE AMPUTATION;  Surgeon: Nadara Mustarduda, Marcus V, MD;  [facility];  Service: Orthopedics;  Laterality: Left;  . CATARACT EXTRACTION W/ INTRAOCULAR LENS  IMPLANT, BILATERAL  ~ 2016  . EYE SURGERY Bilateral    catarct  . I&D EXTREMITY Left 10/17/2014   Procedure: IRRIGATION AND DEBRIDEMENT EXTREMITY LEFT FOOT;  Surgeon: Nadara MustardMarcus Duda V, MD;  Location: [facility] OR;  Service: Orthopedics;  Laterality: Left;  . IR ANGIO INTRA EXTRACRAN SEL COM CAROTID INNOMINATE BILAT MOD SED  05/07/2018  . IR ANGIO VERTEBRAL SEL SUBCLAVIAN INNOMINATE BILAT MOD SED  05/07/2018  . STUMP REVISION Left 08/16/2017   Procedure: REVISION LEFT TRANSMETATARSAL AMPUTATION;  Surgeon: Nadara Mustarduda, Marcus V, MD;  [facility];  Service: Orthopedics;  Laterality: Left;  . STUMP REVISION Left 12/27/2017   Procedure: LEFT BELOW KNEE AMPUTATION REVISION;  Surgeon: Nadara Mustarduda, Marcus V, MD;  [facility];  Service: Orthopedics;  Laterality: Left;  . TOE AMPUTATION Right 2013   5th toe    Social History:  Social History   Socioeconomic History  . Marital status: Married    Spouse name: Not on file  . Number of children: Not on file  . Years of education: Not on file  . Highest education level: Not on file  Occupational History  . Not on file  Social Needs  . Financial resource strain: Not hard at all  . Food insecurity:    Worry: Never true    Inability: Never true  . Transportation needs:    Medical: No    Non-medical:  No  Tobacco Use  . Smoking status: Never Smoker  . Smokeless tobacco: Never Used  Substance and Sexual Activity  . Alcohol use: Yes    Alcohol/week: 1.0 standard drinks    Types: 1 Cans of beer per week    Comment:  "maybe 1 beer 2 times a month"-12/26/2017  . Drug use: No  . Sexual activity: Yes  Lifestyle  . Physical activity:    Days per week: 5 days    Minutes per session: 120 min  . Stress: Not at all  Relationships  . Social connections:    Talks on phone: Three times a week    Gets together: Three times a week    Attends religious service: Never    Active member of club or organization: No    Attends meetings of clubs or organizations: Never    Relationship status: Married  . Intimate partner  violence:    Fear of current or ex partner: No    Emotionally abused: No    Physically abused: No    Forced sexual activity: No  Other Topics Concern  . Not on file  Social History Narrative  . Not on file    Family History:  Family History  Problem Relation Age of Onset  . Dementia Mother     Medications:   Current Outpatient Medications on File Prior to Visit  Medication Sig Dispense Refill  . atorvastatin (LIPITOR) 40 MG tablet Take 1 tablet (40 mg total) by mouth daily at 6 PM. 30 tablet 0  . clopidogrel (PLAVIX) 75 MG tablet Take 1 tablet (75 mg total) by mouth daily. 30 tablet 6  . empagliflozin (JARDIANCE) 10 MG TABS tablet Take 25 mg by mouth daily.     . insulin aspart (NOVOLOG FLEXPEN) 100 UNIT/ML FlexPen Inject 4 Units into the skin 3 (three) times daily with meals. (Patient taking differently: Inject 8 Units into the skin 3 (three) times daily with meals. ) 15 mL 11  . Insulin Detemir (LEVEMIR FLEXTOUCH) 100 UNIT/ML Pen Inject 25 Units into the skin daily at 10 pm. (Patient taking differently: Inject 35 Units into the skin daily at 10 pm. ) 15 mL 11  . metFORMIN (GLUCOPHAGE) 500 MG tablet Take 1 tablet (500 mg total) by mouth 2 (two) times daily with a meal. 60  tablet 0   No current facility-administered medications on file prior to visit.     Allergies:   Allergies  Allergen Reactions  . Oxycodone Shortness Of Breath     Physical Exam  Vitals:   09/04/18 1116  BP: 115/67  Pulse: 74  Weight: 215 lb 3.2 oz (97.6 kg)   Body mass index is 27.63 kg/m. No exam data present  General: well developed, well nourished, pleasant elderly Caucasian male, seated, in no evident distress Head: head normocephalic and atraumatic.   Neck: supple with no carotid or supraclavicular bruits Cardiovascular: regular rate and rhythm, no murmurs Musculoskeletal: no deformity Skin:  no rash/petichiae Vascular:  Normal pulses all extremities  Neurologic Exam Mental Status: Awake and fully alert. Oriented to place and time. Recent and remote memory intact. Attention span, concentration and fund of knowledge appropriate. Mood and affect appropriate.  Cranial Nerves: Pupils equal, briskly reactive to light. Extraocular movements full without nystagmus. Visual fields full to confrontation. Hearing intact. Facial sensation intact. Face, tongue, palate moves normally and symmetrically.  Motor: Normal bulk and tone. Normal strength in all tested extremity muscles. Sensory.: intact to touch , pinprick , position and vibratory sensation.  Coordination: Rapid alternating movements normal in all extremities. Finger-to-nose and heel-to-shin performed accurately bilaterally. Gait and Station: Arises from chair without difficulty. Stance is normal. Gait demonstrates normal stride length and balance. Able to heel, toe and tandem walk without difficulty. Reflexes: 1+ and symmetric. Toes downgoing.        Diagnostic Data (Labs, Imaging, Testing)  MR BRAIN WO CONTRAST MR MRA HEAD  MR MRA NECK 05/07/2018 IMPRESSION: 1. Multiple small ischemic infarcts, including the right medulla oblongata and multiple foci within the right cerebellar hemisphere. No hemorrhage or  mass effect. 2. Loss of flow related enhancement of the distal V3 segment and V4 segment of the right vertebral artery, consistent with occlusion. Magnetic susceptibility effect within the V4 segment on the SWI sequence suggests the presence of thrombus. 3. Otherwise normal MRA of the head and neck. 4. Findings of mild chronic microvascular ischemia.  Cerebral angiogram 05/07/2018 IMPRESSION: Occluded right vertebral artery at the level of C1 with a stenosis of approximately 60% at its origin. Approximately 60% stenosis of the dominant left vertebral artery at its origin.  ECHOCARDIOGRAM 05/07/2018 Impressions: - LVEF 60-65%, moderate LVH, normal wall motion, grade 1 DD,   indeterminate LV filling pressure, mild LAE, IVC suggests   elevated RA pressure of 8 mmHg.   ASSESSMENT: Kevin Bradford is a 73 y.o. year old male here with multiple right PICA territory infarcts in setting of right VA occlusion on 04/23/2018 secondary to large vessel disease. Vascular risk factors include HTN, HLD, DM and CKD.  He returns today for follow-up visit and overall has recovered well from a stroke standpoint without residual deficits or reoccurring of symptoms.   PLAN:  1. Right PICA territory infarcts: Continue clopidogrel 75 mg daily  and atorvastatin 40 mg for secondary stroke prevention.  Maintain strict control of hypertension with blood pressure goal below 130/90, diabetes with hemoglobin A1c goal below 6.5% and cholesterol with LDL cholesterol (bad cholesterol) goal below 70 mg/dL.  I also advised the patient to eat a healthy diet with plenty of whole grains, cereals, fruits and vegetables, exercise regularly with at least 30 minutes of continuous activity daily and maintain ideal body weight.  Continue to participate in PT for continued balance issues. 2. HTN:  Today's BP 116/66.  Not currently on any antihypertensive therapy.  Advised to continue to monitor at home along with continued follow-up  with PCP for management.  Did advise patient to ensure he avoids hypotension due to intracranial stenosis. 3. HLD: Advised to continue current treatment regimen along with continued follow-up with PCP for future prescribing and monitoring of lipid panel 4. DMII: Advised to continue to monitor glucose levels at home along with continued follow-up with PCP for management and monitoring 5.  Right VA occlusion and bilateral VA 60% stenosis: Follow-up with Dr. Corliss Skains at 6 months post stroke for continued monitoring around 11/2018    As he has been stable from a stroke standpoint without residual deficits or recurring of symptoms, recommended follow-up as needed and advised to call office with questions, concerns or need of future follow-up appointment   Greater than 50% of time during this 25 minute visit was spent on counseling, explanation of diagnosis of right PICA territory infarcts, reviewing risk factor management of HTN, HLD, DM and intracranial stenosis, planning of further management along with potential future management, and discussion with patient and family answering all questions.    Holley Hugh, AGNP-BC  [facility] [address] [place], [place] [address]  Phone [phone] Fax [phone] Note: This document was prepared with digital dictation and possible smart phrase technology. Any transcriptional errors that result from this process are unintentional.

## 2018-09-12 DIAGNOSIS — N183 Chronic kidney disease, stage 3 (moderate): Secondary | ICD-10-CM | POA: Diagnosis not present

## 2018-09-12 DIAGNOSIS — E11621 Type 2 diabetes mellitus with foot ulcer: Secondary | ICD-10-CM | POA: Diagnosis not present

## 2018-09-12 DIAGNOSIS — Z794 Long term (current) use of insulin: Secondary | ICD-10-CM | POA: Diagnosis not present

## 2018-09-12 DIAGNOSIS — I639 Cerebral infarction, unspecified: Secondary | ICD-10-CM | POA: Diagnosis not present

## 2018-09-12 DIAGNOSIS — E1140 Type 2 diabetes mellitus with diabetic neuropathy, unspecified: Secondary | ICD-10-CM | POA: Diagnosis not present

## 2019-01-15 ENCOUNTER — Telehealth (HOSPITAL_COMMUNITY): Payer: Self-pay

## 2019-01-15 NOTE — Telephone Encounter (Signed)
Called to schedule us carotid, no answer, left vm. AW  

## 2019-01-16 ENCOUNTER — Other Ambulatory Visit (HOSPITAL_COMMUNITY): Payer: Self-pay | Admitting: Interventional Radiology

## 2019-01-16 DIAGNOSIS — I639 Cerebral infarction, unspecified: Secondary | ICD-10-CM

## 2019-01-23 ENCOUNTER — Other Ambulatory Visit: Payer: Self-pay

## 2019-01-23 ENCOUNTER — Ambulatory Visit (HOSPITAL_COMMUNITY)
Admission: RE | Admit: 2019-01-23 | Discharge: 2019-01-23 | Disposition: A | Discharge status: Home / Self Care | Payer: Medicare Other | Source: Ambulatory Visit | Attending: Interventional Radiology | Admitting: Interventional Radiology

## 2019-01-23 DIAGNOSIS — I639 Cerebral infarction, unspecified: Secondary | ICD-10-CM | POA: Diagnosis not present

## 2019-01-23 NOTE — Progress Notes (Signed)
Carotid artery duplex has been completed. Preliminary results can be found in CV Proc through chart review.   01/23/19 1:31 PM Kevin Bradford RVT

## 2019-01-28 ENCOUNTER — Telehealth (HOSPITAL_COMMUNITY): Payer: Self-pay

## 2019-01-28 NOTE — Telephone Encounter (Signed)
Pt agreed to f/u in 6 months with us carotid. AW 

## 2019-01-28 NOTE — Telephone Encounter (Signed)
Left message for pt to f/u in 6 months, call with any questions. AW  °

## 2019-02-07 DIAGNOSIS — I639 Cerebral infarction, unspecified: Secondary | ICD-10-CM | POA: Diagnosis not present

## 2019-02-07 DIAGNOSIS — E11621 Type 2 diabetes mellitus with foot ulcer: Secondary | ICD-10-CM | POA: Diagnosis not present

## 2019-02-07 DIAGNOSIS — E7800 Pure hypercholesterolemia, unspecified: Secondary | ICD-10-CM | POA: Diagnosis not present

## 2019-02-07 DIAGNOSIS — N183 Chronic kidney disease, stage 3 (moderate): Secondary | ICD-10-CM | POA: Diagnosis not present

## 2019-02-07 DIAGNOSIS — E1140 Type 2 diabetes mellitus with diabetic neuropathy, unspecified: Secondary | ICD-10-CM | POA: Diagnosis not present

## 2019-02-07 DIAGNOSIS — E1122 Type 2 diabetes mellitus with diabetic chronic kidney disease: Secondary | ICD-10-CM | POA: Diagnosis not present

## 2019-02-07 DIAGNOSIS — Z794 Long term (current) use of insulin: Secondary | ICD-10-CM | POA: Diagnosis not present

## 2019-03-18 DIAGNOSIS — E1140 Type 2 diabetes mellitus with diabetic neuropathy, unspecified: Secondary | ICD-10-CM | POA: Diagnosis not present

## 2019-03-18 DIAGNOSIS — I639 Cerebral infarction, unspecified: Secondary | ICD-10-CM | POA: Diagnosis not present

## 2019-03-18 DIAGNOSIS — N183 Chronic kidney disease, stage 3 (moderate): Secondary | ICD-10-CM | POA: Diagnosis not present

## 2019-03-18 DIAGNOSIS — E1122 Type 2 diabetes mellitus with diabetic chronic kidney disease: Secondary | ICD-10-CM | POA: Diagnosis not present

## 2019-03-18 DIAGNOSIS — E7800 Pure hypercholesterolemia, unspecified: Secondary | ICD-10-CM | POA: Diagnosis not present

## 2019-03-18 DIAGNOSIS — E11621 Type 2 diabetes mellitus with foot ulcer: Secondary | ICD-10-CM | POA: Diagnosis not present

## 2019-05-05 IMAGING — MR MR HEAD W/O CM
8 of 9 series · 42 of 48 positions shown · non-contrast
Comparison: 05/07/2018 MRI of the head. 05/07/2018 cerebral
angiogram.

CLINICAL DATA: 72 y/o  M; follow-up of stroke.

EXAM:
MRI HEAD WITHOUT CONTRAST
TECHNIQUE: Multiplanar, multiecho pulse sequences of the brain and surrounding
structures were obtained without intravenous contrast.

[Series 5: DWI · axial · 4.0mm · 0.88mm/px · z∈[-88,+63]mm · 9 of 80 slices shown (1 of 4)]
[im 1/80]
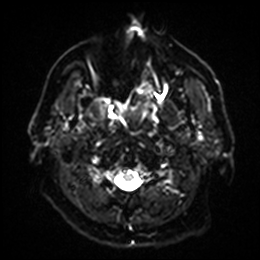
[im 10/80]
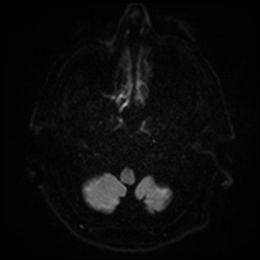
[im 20/80]
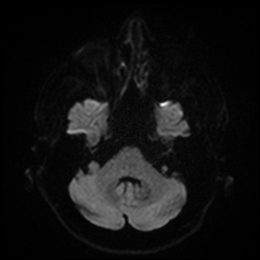
[im 30/80]
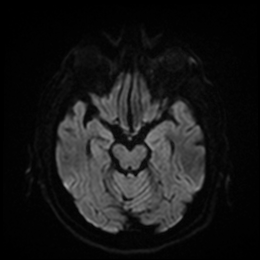
[im 40/80]
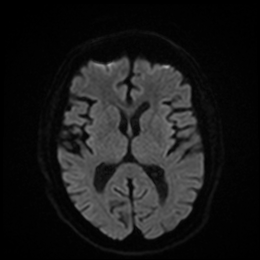
[im 50/80]
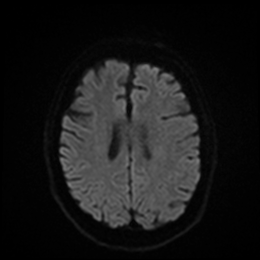
[im 60/80]
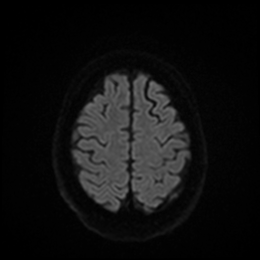
[im 70/80]
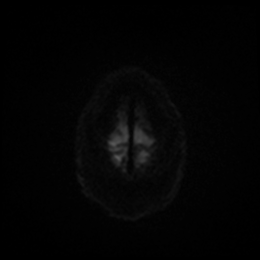
[im 80/80]
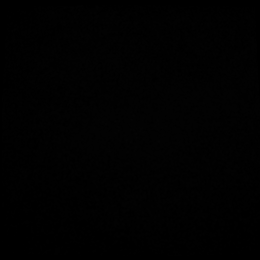

[Series 6: DWI · axial · 4.0mm · 0.88mm/px · z∈[-88,+59]mm · 4 of 39 slices shown (2 of 4)]
[im 1/39]
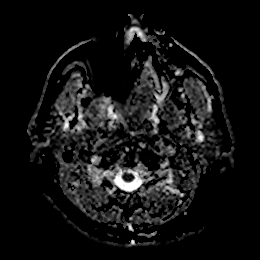
[im 13/39]
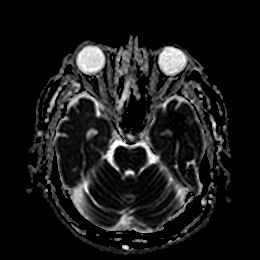
[im 26/39]
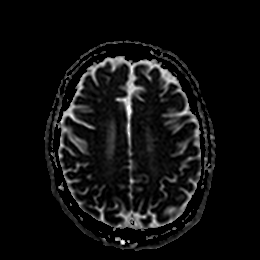
[im 39/39]
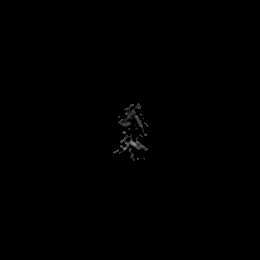

[Series 7: DWI · coronal · 5.0mm · 0.88mm/px · 8 of 80 slices shown (3 of 4)]
[im 1/80]
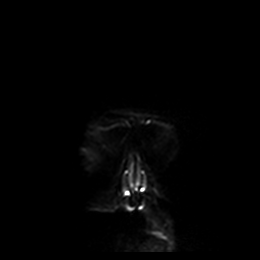
[im 12/80]
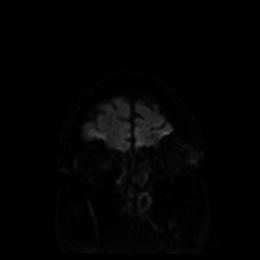
[im 23/80]
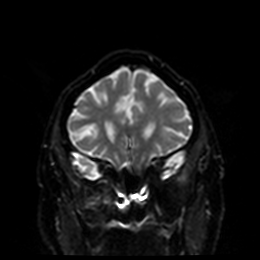
[im 34/80]
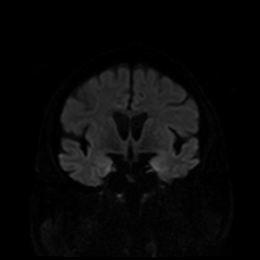
[im 46/80]
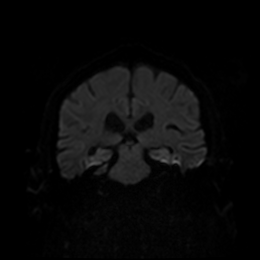
[im 57/80]
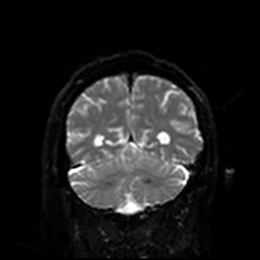
[im 68/80]
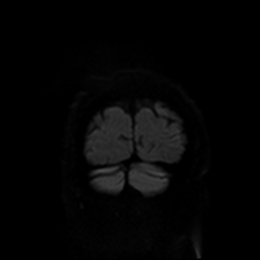
[im 80/80]
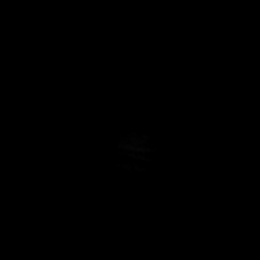

[Series 8: DWI · coronal · 5.0mm · 0.88mm/px · 4 of 40 slices shown (4 of 4)]
[im 1/40]
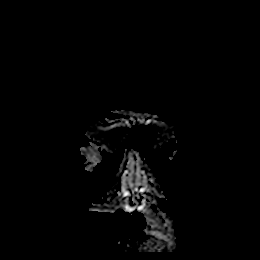
[im 14/40]
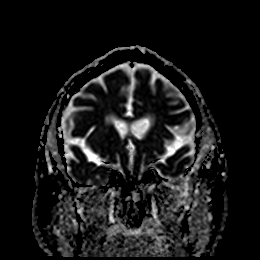
[im 27/40]
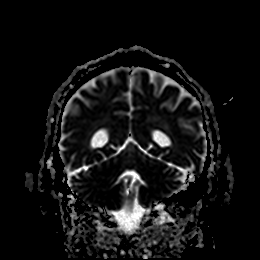
[im 40/40]
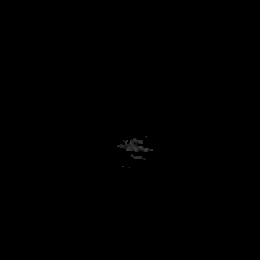

[Series 9: FLAIR · axial · 5.0mm · 0.47mm/px · z∈[-89,+67]mm · 3 of 28 slices shown]
[im 1/28]
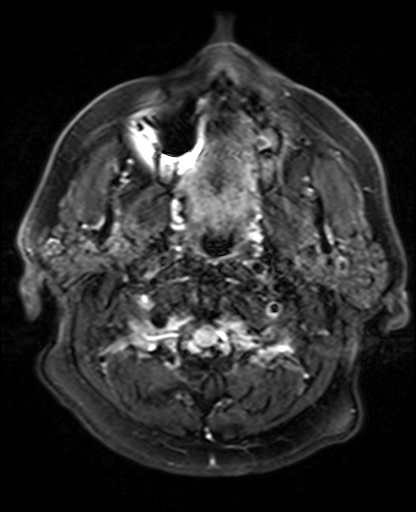
[im 14/28]
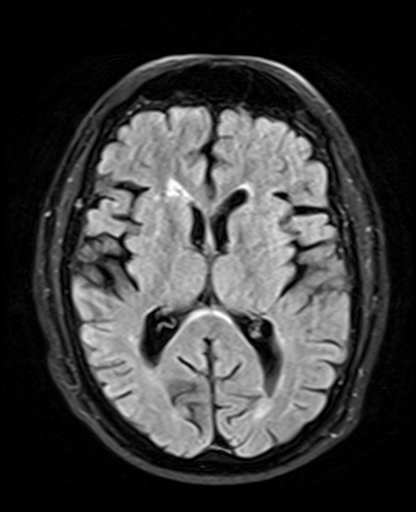
[im 28/28]
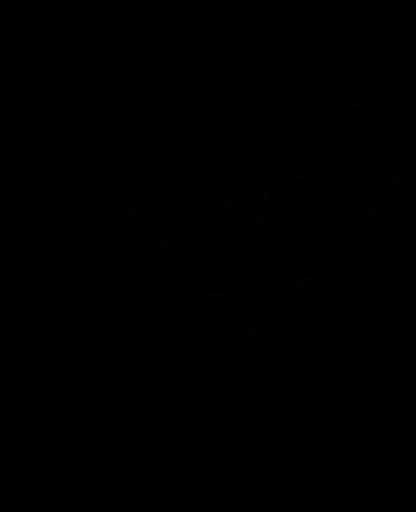

[Series 10: swi_images · axial · 3.0mm · 0.94mm/px · z∈[-102,+69]mm · 6 of 60 slices shown]
[im 1/60]
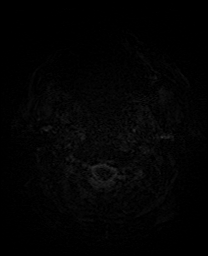
[im 12/60]
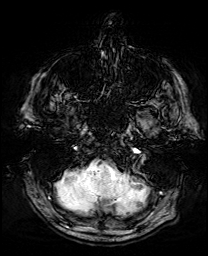
[im 24/60]
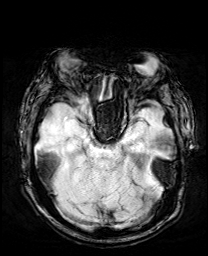
[im 36/60]
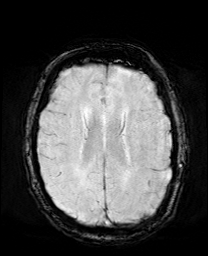
[im 48/60]
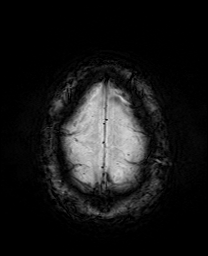
[im 60/60]
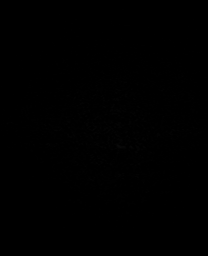

[Series 11: mip_images(sw) · axial · 24.0mm · 0.94mm/px · z∈[-91,+59]mm · 5 of 53 slices shown]
[im 1/53]
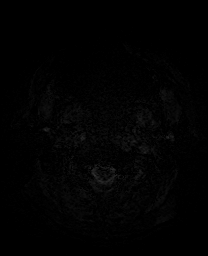
[im 14/53]
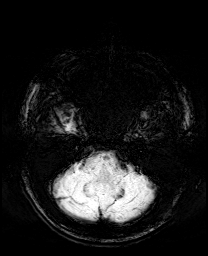
[im 27/53]
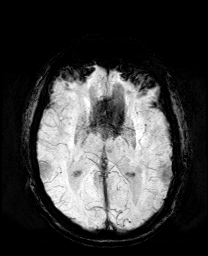
[im 40/53]
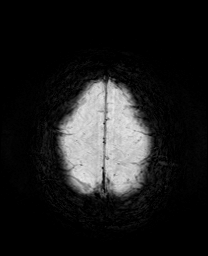
[im 53/53]
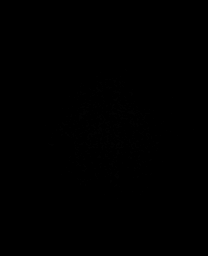

[Series 13: T2 · coronal · 5.0mm · 0.36mm/px · 3 of 34 slices shown]
[im 1/34]
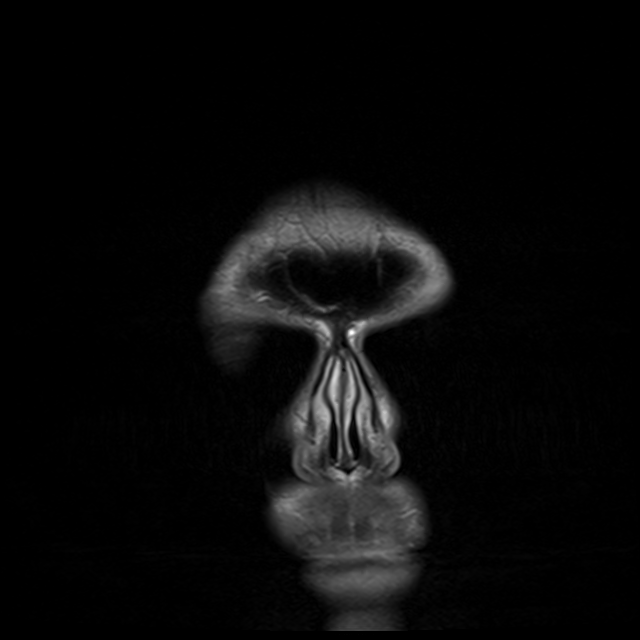
[im 17/34]
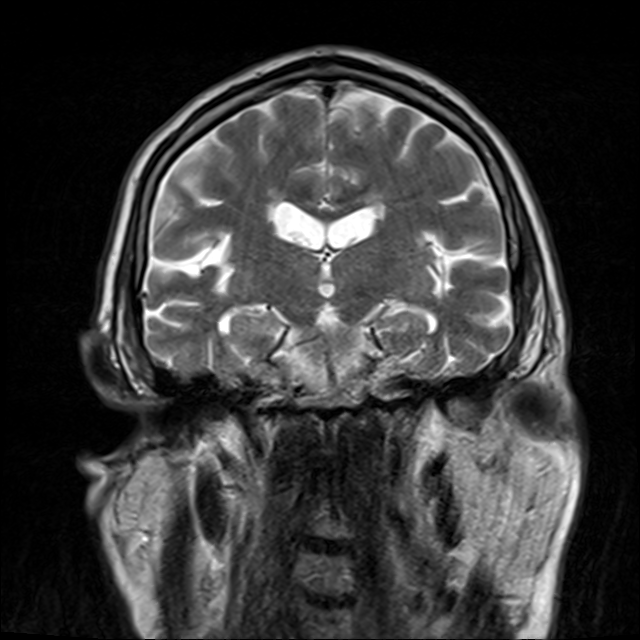
[im 34/34]
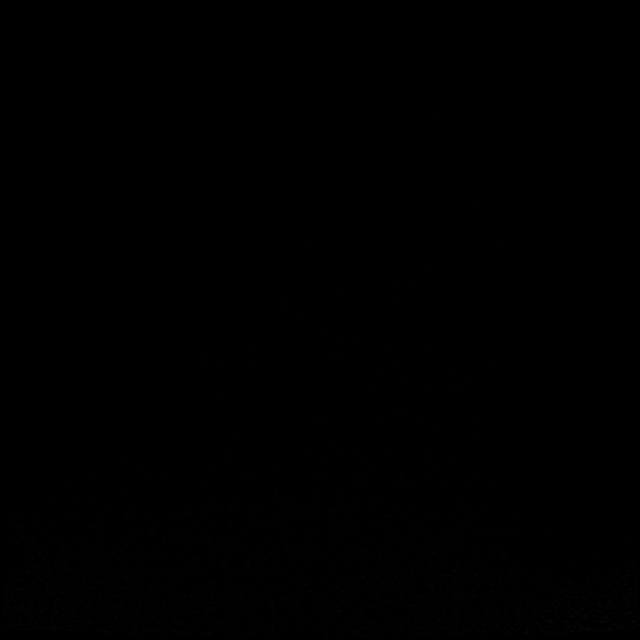

[42 of 48 positions shown; findings below may reference images not displayed]

FINDINGS: Brain: Multiple subcentimeter infarctions within the right lateral
medulla the right cerebellar hemisphere are stable in distribution
with nearly normalized diffusivity compatible with subacute
infarction. No associated hemorrhage or mass effect. No new focus of
reduced diffusion to indicate interval acute or early subacute
infarction.

Stable nonspecific T2 FLAIR hyperintensities in subcortical and
periventricular white matter are compatible with mild chronic
microvascular ischemic changes for age. Mild volume loss of the
brain. No extra-axial collection, hydrocephalus, mass effect, or
herniation.

Vascular: Stable abnormal flow void within the right vertebral
artery and susceptibility hypointensity, probably representing
thrombus.

Skull and upper cervical spine: Normal marrow signal.

Sinuses/Orbits: Negative.

Other: None.
IMPRESSION: 1. Subcentimeter subacute infarcts within the right lateral medulla
and right cerebellar hemisphere are stable in distribution from the
prior MRI of the head. No associated hemorrhage or mass effect.
2. Stable abnormal flow void within the right vertebral artery,
suspected thrombus.
3. No new acute intracranial abnormality.
4. Stable mild chronic microvascular ischemic changes and volume
loss of the brain.

By: Samodai-Fenyvesi Andrejcsik M.D.

## 2019-06-05 DIAGNOSIS — Z89512 Acquired absence of left leg below knee: Secondary | ICD-10-CM | POA: Diagnosis not present

## 2019-06-05 DIAGNOSIS — N183 Chronic kidney disease, stage 3 unspecified: Secondary | ICD-10-CM | POA: Diagnosis not present

## 2019-06-05 DIAGNOSIS — E1140 Type 2 diabetes mellitus with diabetic neuropathy, unspecified: Secondary | ICD-10-CM | POA: Diagnosis not present

## 2019-06-05 DIAGNOSIS — E7800 Pure hypercholesterolemia, unspecified: Secondary | ICD-10-CM | POA: Diagnosis not present

## 2019-06-05 DIAGNOSIS — Z125 Encounter for screening for malignant neoplasm of prostate: Secondary | ICD-10-CM | POA: Diagnosis not present

## 2019-06-05 DIAGNOSIS — Z0000 Encounter for general adult medical examination without abnormal findings: Secondary | ICD-10-CM | POA: Diagnosis not present

## 2019-06-05 DIAGNOSIS — I739 Peripheral vascular disease, unspecified: Secondary | ICD-10-CM | POA: Diagnosis not present

## 2019-06-05 DIAGNOSIS — Z23 Encounter for immunization: Secondary | ICD-10-CM | POA: Diagnosis not present

## 2019-06-05 DIAGNOSIS — E1122 Type 2 diabetes mellitus with diabetic chronic kidney disease: Secondary | ICD-10-CM | POA: Diagnosis not present

## 2019-06-05 DIAGNOSIS — I639 Cerebral infarction, unspecified: Secondary | ICD-10-CM | POA: Diagnosis not present

## 2019-06-05 DIAGNOSIS — Z1389 Encounter for screening for other disorder: Secondary | ICD-10-CM | POA: Diagnosis not present

## 2019-06-27 DIAGNOSIS — E119 Type 2 diabetes mellitus without complications: Secondary | ICD-10-CM | POA: Diagnosis not present

## 2019-07-30 ENCOUNTER — Telehealth (HOSPITAL_COMMUNITY): Payer: Self-pay

## 2019-07-30 NOTE — Telephone Encounter (Signed)
Called to schedule f/u us carotid, no answer, left vm. AW 

## 2019-08-09 DIAGNOSIS — E1140 Type 2 diabetes mellitus with diabetic neuropathy, unspecified: Secondary | ICD-10-CM | POA: Diagnosis not present

## 2019-08-09 DIAGNOSIS — E7800 Pure hypercholesterolemia, unspecified: Secondary | ICD-10-CM | POA: Diagnosis not present

## 2019-08-09 DIAGNOSIS — E11621 Type 2 diabetes mellitus with foot ulcer: Secondary | ICD-10-CM | POA: Diagnosis not present

## 2019-08-09 DIAGNOSIS — I639 Cerebral infarction, unspecified: Secondary | ICD-10-CM | POA: Diagnosis not present

## 2019-08-09 DIAGNOSIS — E1122 Type 2 diabetes mellitus with diabetic chronic kidney disease: Secondary | ICD-10-CM | POA: Diagnosis not present

## 2019-08-22 ENCOUNTER — Telehealth (HOSPITAL_COMMUNITY): Payer: Self-pay

## 2019-08-22 NOTE — Telephone Encounter (Signed)
Called to schedule f/u us carotid, no answer, left vm. AW 

## 2019-09-02 DIAGNOSIS — Z7984 Long term (current) use of oral hypoglycemic drugs: Secondary | ICD-10-CM | POA: Diagnosis not present

## 2019-09-02 DIAGNOSIS — E1140 Type 2 diabetes mellitus with diabetic neuropathy, unspecified: Secondary | ICD-10-CM | POA: Diagnosis not present

## 2019-09-02 DIAGNOSIS — I639 Cerebral infarction, unspecified: Secondary | ICD-10-CM | POA: Diagnosis not present

## 2019-09-02 DIAGNOSIS — E1122 Type 2 diabetes mellitus with diabetic chronic kidney disease: Secondary | ICD-10-CM | POA: Diagnosis not present

## 2019-09-02 DIAGNOSIS — E7800 Pure hypercholesterolemia, unspecified: Secondary | ICD-10-CM | POA: Diagnosis not present

## 2019-09-02 DIAGNOSIS — E11621 Type 2 diabetes mellitus with foot ulcer: Secondary | ICD-10-CM | POA: Diagnosis not present

## 2019-10-23 ENCOUNTER — Other Ambulatory Visit (HOSPITAL_COMMUNITY): Payer: Self-pay | Admitting: Interventional Radiology

## 2019-10-23 ENCOUNTER — Telehealth (HOSPITAL_COMMUNITY): Payer: Self-pay

## 2019-10-23 DIAGNOSIS — I639 Cerebral infarction, unspecified: Secondary | ICD-10-CM

## 2019-10-23 DIAGNOSIS — I771 Stricture of artery: Secondary | ICD-10-CM

## 2019-10-23 NOTE — Telephone Encounter (Signed)
Called to schedule us carotid, no answer, left vm. AW  

## 2019-11-05 ENCOUNTER — Other Ambulatory Visit: Payer: Self-pay

## 2019-11-05 ENCOUNTER — Ambulatory Visit (HOSPITAL_COMMUNITY)
Admission: RE | Admit: 2019-11-05 | Discharge: 2019-11-05 | Disposition: A | Discharge status: Home / Self Care | Payer: Medicare Other | Source: Ambulatory Visit | Attending: Interventional Radiology | Admitting: Interventional Radiology

## 2019-11-05 DIAGNOSIS — I639 Cerebral infarction, unspecified: Secondary | ICD-10-CM | POA: Diagnosis not present

## 2019-11-05 DIAGNOSIS — I771 Stricture of artery: Secondary | ICD-10-CM | POA: Diagnosis not present

## 2019-11-05 NOTE — Progress Notes (Signed)
Carotid duplex       has been completed. Preliminary results can be found under CV proc through chart review. Griffin Dewilde, BS, RDMS, RVT   

## 2019-11-06 ENCOUNTER — Telehealth (HOSPITAL_COMMUNITY): Payer: Self-pay

## 2019-11-06 NOTE — Telephone Encounter (Signed)
Called pt regarding recent us carotid, no answer, left vm. AW 

## 2019-11-11 ENCOUNTER — Telehealth (HOSPITAL_COMMUNITY): Payer: Self-pay

## 2019-11-11 NOTE — Telephone Encounter (Signed)
Pt agreed to f/u in 6 months with us carotid. AW 

## 2019-11-13 DIAGNOSIS — R300 Dysuria: Secondary | ICD-10-CM | POA: Diagnosis not present

## 2019-11-13 DIAGNOSIS — N3001 Acute cystitis with hematuria: Secondary | ICD-10-CM | POA: Diagnosis not present

## 2019-12-04 DIAGNOSIS — Z89512 Acquired absence of left leg below knee: Secondary | ICD-10-CM | POA: Diagnosis not present

## 2019-12-04 DIAGNOSIS — N390 Urinary tract infection, site not specified: Secondary | ICD-10-CM | POA: Diagnosis not present

## 2019-12-04 DIAGNOSIS — I639 Cerebral infarction, unspecified: Secondary | ICD-10-CM | POA: Diagnosis not present

## 2019-12-04 DIAGNOSIS — I739 Peripheral vascular disease, unspecified: Secondary | ICD-10-CM | POA: Diagnosis not present

## 2019-12-04 DIAGNOSIS — N183 Chronic kidney disease, stage 3 unspecified: Secondary | ICD-10-CM | POA: Diagnosis not present

## 2019-12-04 DIAGNOSIS — Z794 Long term (current) use of insulin: Secondary | ICD-10-CM | POA: Diagnosis not present

## 2019-12-04 DIAGNOSIS — E1140 Type 2 diabetes mellitus with diabetic neuropathy, unspecified: Secondary | ICD-10-CM | POA: Diagnosis not present

## 2020-01-21 DIAGNOSIS — I639 Cerebral infarction, unspecified: Secondary | ICD-10-CM | POA: Diagnosis not present

## 2020-01-21 DIAGNOSIS — E1122 Type 2 diabetes mellitus with diabetic chronic kidney disease: Secondary | ICD-10-CM | POA: Diagnosis not present

## 2020-01-21 DIAGNOSIS — N183 Chronic kidney disease, stage 3 unspecified: Secondary | ICD-10-CM | POA: Diagnosis not present

## 2020-01-21 DIAGNOSIS — E7800 Pure hypercholesterolemia, unspecified: Secondary | ICD-10-CM | POA: Diagnosis not present

## 2020-01-21 DIAGNOSIS — E1140 Type 2 diabetes mellitus with diabetic neuropathy, unspecified: Secondary | ICD-10-CM | POA: Diagnosis not present

## 2020-01-21 DIAGNOSIS — E11621 Type 2 diabetes mellitus with foot ulcer: Secondary | ICD-10-CM | POA: Diagnosis not present

## 2020-04-22 DIAGNOSIS — E11621 Type 2 diabetes mellitus with foot ulcer: Secondary | ICD-10-CM | POA: Diagnosis not present

## 2020-04-22 DIAGNOSIS — E1122 Type 2 diabetes mellitus with diabetic chronic kidney disease: Secondary | ICD-10-CM | POA: Diagnosis not present

## 2020-04-22 DIAGNOSIS — E7800 Pure hypercholesterolemia, unspecified: Secondary | ICD-10-CM | POA: Diagnosis not present

## 2020-04-22 DIAGNOSIS — E1140 Type 2 diabetes mellitus with diabetic neuropathy, unspecified: Secondary | ICD-10-CM | POA: Diagnosis not present

## 2020-04-22 DIAGNOSIS — N183 Chronic kidney disease, stage 3 unspecified: Secondary | ICD-10-CM | POA: Diagnosis not present

## 2020-04-22 DIAGNOSIS — I639 Cerebral infarction, unspecified: Secondary | ICD-10-CM | POA: Diagnosis not present

## 2020-05-29 DIAGNOSIS — N183 Chronic kidney disease, stage 3 unspecified: Secondary | ICD-10-CM | POA: Diagnosis not present

## 2020-05-29 DIAGNOSIS — Z89512 Acquired absence of left leg below knee: Secondary | ICD-10-CM | POA: Diagnosis not present

## 2020-05-29 DIAGNOSIS — Z794 Long term (current) use of insulin: Secondary | ICD-10-CM | POA: Diagnosis not present

## 2020-05-29 DIAGNOSIS — I639 Cerebral infarction, unspecified: Secondary | ICD-10-CM | POA: Diagnosis not present

## 2020-05-29 DIAGNOSIS — I739 Peripheral vascular disease, unspecified: Secondary | ICD-10-CM | POA: Diagnosis not present

## 2020-05-29 DIAGNOSIS — E1122 Type 2 diabetes mellitus with diabetic chronic kidney disease: Secondary | ICD-10-CM | POA: Diagnosis not present

## 2020-05-29 DIAGNOSIS — E7800 Pure hypercholesterolemia, unspecified: Secondary | ICD-10-CM | POA: Diagnosis not present

## 2020-05-29 DIAGNOSIS — Z1389 Encounter for screening for other disorder: Secondary | ICD-10-CM | POA: Diagnosis not present

## 2020-05-29 DIAGNOSIS — Z0000 Encounter for general adult medical examination without abnormal findings: Secondary | ICD-10-CM | POA: Diagnosis not present

## 2020-05-29 DIAGNOSIS — Z1211 Encounter for screening for malignant neoplasm of colon: Secondary | ICD-10-CM | POA: Diagnosis not present

## 2020-05-29 DIAGNOSIS — E1140 Type 2 diabetes mellitus with diabetic neuropathy, unspecified: Secondary | ICD-10-CM | POA: Diagnosis not present

## 2020-06-26 DIAGNOSIS — E1122 Type 2 diabetes mellitus with diabetic chronic kidney disease: Secondary | ICD-10-CM | POA: Diagnosis not present

## 2020-06-26 DIAGNOSIS — N183 Chronic kidney disease, stage 3 unspecified: Secondary | ICD-10-CM | POA: Diagnosis not present

## 2020-06-26 DIAGNOSIS — E1140 Type 2 diabetes mellitus with diabetic neuropathy, unspecified: Secondary | ICD-10-CM | POA: Diagnosis not present

## 2020-06-26 DIAGNOSIS — I639 Cerebral infarction, unspecified: Secondary | ICD-10-CM | POA: Diagnosis not present

## 2020-06-26 DIAGNOSIS — E7800 Pure hypercholesterolemia, unspecified: Secondary | ICD-10-CM | POA: Diagnosis not present

## 2020-06-26 DIAGNOSIS — E11621 Type 2 diabetes mellitus with foot ulcer: Secondary | ICD-10-CM | POA: Diagnosis not present

## 2021-09-13 ENCOUNTER — Telehealth: Payer: Self-pay | Admitting: Orthopedic Surgery

## 2021-09-13 NOTE — Telephone Encounter (Signed)
Pt wife called. Pt wife states pt needs a new fitting for his prothesis. She states [facility] told her to get a new script from [facility] [id number]

## 2021-09-14 NOTE — Telephone Encounter (Signed)
Pt uses [facility] on [address]. Will have Rx ready to send tomorrow 09/15/21.

## 2021-09-14 NOTE — Telephone Encounter (Signed)
I see pt is on the schedule on 09/17/21 for an apt to get new prosthetic. He has not been seen since 2019

## 2021-09-17 ENCOUNTER — Ambulatory Visit (INDEPENDENT_AMBULATORY_CARE_PROVIDER_SITE_OTHER): Payer: 59 | Admitting: Family

## 2021-09-17 ENCOUNTER — Encounter: Payer: Self-pay | Admitting: Family

## 2021-09-17 DIAGNOSIS — Z89512 Acquired absence of left leg below knee: Secondary | ICD-10-CM | POA: Diagnosis not present

## 2021-09-17 NOTE — Progress Notes (Signed)
? ?Office Visit Note ?  ?Patient: Kevin Bradford           ?Date of Birth: June 22, 1946           ?MRN: [id number] ?Visit Date: 09/17/2021 ?             ?Requested by: Wenda Low, MD ?[address] ?[address] ?[place],  [place] [address] ?PCP: Wenda Low, MD ? ?Chief Complaint  ?Patient presents with  ? Left Leg - Follow-up  ?  Left BKA,needs new prosthetic  ? ? ? ? ?HPI: ?The patient is a 76 year old gentleman who presents today for evaluation of his left residual limb he is status post left below-knee amputation nearly 4 years ago.  He states he has had significant volume loss and is starting to have a poor fit he does not have any callus or ulcer buildup but is having difficulty keeping on his prosthesis, fears falling ? ?Assessment & Plan: ?Visit Diagnoses: No diagnosis found. ? ?Plan: Order for new prosthesis set up provided.  He will follow-up in the office as needed ? ?Follow-Up Instructions: No follow-ups on file.  ? ?Ortho Exam ? ?Patient is alert, oriented, no adenopathy, well-dressed, normal affect, normal respiratory effort. ?On examination of the left residual limb this is well consolidated well-healed there is superficial callus buildup over the anterior tibia there is no erythema no drainage ? ?Imaging: ?No results found. ?No images are attached to the encounter. ? ?Labs: ?Lab Results  ?Component Value Date  ? HGBA1C 8.2 (H) 05/07/2018  ? HGBA1C 6.7 (H) 11/22/2017  ? HGBA1C 9.3 (H) 07/19/2017  ? ESRSEDRATE 119 (H) 10/18/2014  ? ESRSEDRATE 115 (H) 10/13/2014  ? CRP 8.5 (H) 10/18/2014  ? CRP 21.9 (H) 10/13/2014  ? LABURIC 3.3 (L) 10/14/2014  ? REPTSTATUS 07/24/2017 FINAL 07/19/2017  ? GRAMSTAIN  07/19/2017  ?  FEW WBC PRESENT, PREDOMINANTLY MONONUCLEAR ?MODERATE GRAM POSITIVE COCCI IN PAIRS ?MODERATE GRAM NEGATIVE RODS ?  ? CULT  07/19/2017  ?  MODERATE ENTEROCOCCUS FAECALIS ?FEW METHICILLIN RESISTANT STAPHYLOCOCCUS AUREUS ?MODERATE BACTEROIDES ORALIS ?BETA LACTAMASE POSITIVE ?  ? LABORGA  METHICILLIN RESISTANT STAPHYLOCOCCUS AUREUS 07/19/2017  ? LABORGA ENTEROCOCCUS FAECALIS 07/19/2017  ? ? ? ?Lab Results  ?Component Value Date  ? ALBUMIN 3.9 05/06/2018  ? ALBUMIN 3.5 11/27/2017  ? ALBUMIN 3.6 08/16/2017  ? ? ?No results found for: MG ?No results found for: VD25OH ? ?No results found for: PREALBUMIN ?CBC EXTENDED Latest Ref Rng & Units 05/13/2018 05/06/2018 12/27/2017  ?WBC 4.0 - 10.5 K/uL 6.0 8.0 5.9  ?RBC 4.22 - 5.81 MIL/uL 3.99(L) 4.24 4.21(L)  ?HGB 13.0 - 17.0 g/dL 11.5(L) 12.1(L) 12.2(L)  ?HCT 39.0 - 52.0 % 35.6(L) 37.6(L) 38.7(L)  ?PLT 150 - 400 K/uL 173 167 160  ?NEUTROABS 1.7 - 7.7 K/uL - 6.2 -  ?LYMPHSABS 0.7 - 4.0 K/uL - 1.2 -  ? ? ? ?There is no height or weight on file to calculate BMI. ? ?Orders:  ?No orders of the defined types were placed in this encounter. ? ?No orders of the defined types were placed in this encounter. ? ? ? Procedures: ?No procedures performed ? ?Clinical Data: ?No additional findings. ? ?ROS: ? ?All other systems negative, except as noted in the HPI. ?Review of Systems ? ?Objective: ?Vital Signs: There were no vitals taken for this visit. ? ?Specialty Comments:  ?No specialty comments available. ? ?PMFS History: ?Patient Active Problem List  ? Diagnosis Date Noted  ? Stroke [facility])   ? Symptomatic  bradycardia 05/06/2018  ? Postural dizziness with near syncope 05/06/2018  ? Labile blood glucose   ? Acute blood loss anemia   ? Labile blood pressure   ? Stage 3 chronic kidney disease ([place])   ? Type 2 diabetes mellitus with peripheral neuropathy (HCC)   ? Post-operative pain   ? Amputation of left lower extremity below knee upon examination ([place]) 11/24/2017  ? Amputated below knee, left ([place]) 11/22/2017  ? Osteomyelitis of left foot ([place])   ? Dehiscence of amputation stump ([place]) 08/11/2017  ? S/P transmetatarsal amputation of foot, left ([place]) 07/19/2017  ? Subacute osteomyelitis, left ankle and foot ([place]) 07/17/2017  ? Diabetic ulcer of left foot associated with type 2  diabetes mellitus ([place])   ? Diabetes mellitus with renal complications ([place]) [id number]  ? Diabetes ([place]) 10/13/2014  ? Anemia 10/13/2014  ? CKD (chronic kidney disease) stage 3, GFR 30-59 ml/min (HCC) 10/13/2014  ? Hyperkalemia 10/13/2014  ? ?Past Medical History:  ?Diagnosis Date  ? Arthritis   ? "joint pain" (08/16/2017)  ? Below knee amputation ([place])   ? Cellulitis and abscess of leg 10/18/2014  ? CKD (chronic kidney disease) stage 3, GFR 30-59 ml/min (HCC)   ? creatinine increases with antibiotics per pt, no known kidney disease per patient  ? Dehiscence of closure of skin, subsequent encounter   ? Diabetic foot ulcer ([place])   ? left  ? Diabetic peripheral neuropathy ([place])   ? GERD (gastroesophageal reflux disease)   ? 12/26/2017- "not this year."   ? Osteomyelitis ([place])   ? left transmetatarsal   ? Stroke [facility])   ? Type II diabetes mellitus ([place])   ?  ?Family History  ?Problem Relation Age of Onset  ? Dementia Mother   ?  ?Past Surgical History:  ?Procedure Laterality Date  ? AMPUTATION Left 07/19/2017  ? Procedure: LEFT TRANSMETATARSAL AMPUTATION;  Surgeon: Newt Minion, MD;  Location: [place];  Service: Orthopedics;  Laterality: Left;  ? AMPUTATION Left 08/16/2017  ? REVISION LEFT TRANSMETATARSAL AMPUTATION  ? AMPUTATION Left 11/22/2017  ? Procedure: LEFT BELOW KNEE AMPUTATION;  Surgeon: Newt Minion, MD;  Location: [place];  Service: Orthopedics;  Laterality: Left;  ? CATARACT EXTRACTION W/ INTRAOCULAR LENS  IMPLANT, BILATERAL  ~ 2016  ? EYE SURGERY Bilateral   ? catarct  ? I & D EXTREMITY Left 10/17/2014  ? Procedure: IRRIGATION AND DEBRIDEMENT EXTREMITY LEFT FOOT;  Surgeon: Newt Minion, MD;  Location: [place];  Service: Orthopedics;  Laterality: Left;  ? IR ANGIO INTRA EXTRACRAN SEL COM CAROTID INNOMINATE BILAT MOD SED  05/07/2018  ? IR ANGIO VERTEBRAL SEL SUBCLAVIAN INNOMINATE BILAT MOD SED  05/07/2018  ? STUMP REVISION Left 08/16/2017  ? Procedure: REVISION LEFT TRANSMETATARSAL AMPUTATION;  Surgeon: Newt Minion,  MD;  Location: [place];  Service: Orthopedics;  Laterality: Left;  ? STUMP REVISION Left 12/27/2017  ? Procedure: LEFT BELOW KNEE AMPUTATION REVISION;  Surgeon: Newt Minion, MD;  Location: [place];  Service: Orthopedics;  Laterality: Left;  ? TOE AMPUTATION Right 2013  ? 5th toe  ? ?Social History  ? ?Occupational History  ? Not on file  ?Tobacco Use  ? Smoking status: Never  ? Smokeless tobacco: Never  ?Vaping Use  ? Vaping Use: Never used  ?Substance and Sexual Activity  ? Alcohol use: Yes  ?  Alcohol/week: 1.0 standard drink  ?  Types: 1 Cans of beer per week  ?  Comment:  "maybe 1 beer  2 times a month"-12/26/2017  ? Drug use: No  ? Sexual activity: Yes  ? ? ? ? ? ? ?

## 2021-11-05 ENCOUNTER — Other Ambulatory Visit (HOSPITAL_COMMUNITY): Payer: Self-pay

## 2023-11-28 ENCOUNTER — Telehealth: Payer: Self-pay | Admitting: Orthopedic Surgery

## 2023-11-28 NOTE — Telephone Encounter (Signed)
 Pt is requesting new equipment his prosthetic LT leg. Pt stated he feels fine, no problems has occurred he just need new equipment from [employer]

## 2023-11-28 NOTE — Telephone Encounter (Signed)
 We last saw this pt in 2023. In order for insurance to accept our rx he needs to have a current office visit. Please call pt and sch first available please.

## 2023-12-07 ENCOUNTER — Encounter (HOSPITAL_BASED_OUTPATIENT_CLINIC_OR_DEPARTMENT_OTHER): Payer: Self-pay

## 2023-12-07 ENCOUNTER — Other Ambulatory Visit: Payer: Self-pay

## 2023-12-07 ENCOUNTER — Inpatient Hospital Stay (HOSPITAL_BASED_OUTPATIENT_CLINIC_OR_DEPARTMENT_OTHER)
Admission: EM | Admit: 2023-12-07 | Discharge: 2023-12-09 | DRG: 639 | Disposition: A | Discharge status: Home / Self Care | Attending: Family Medicine | Admitting: Family Medicine

## 2023-12-07 ENCOUNTER — Emergency Department (HOSPITAL_BASED_OUTPATIENT_CLINIC_OR_DEPARTMENT_OTHER)

## 2023-12-07 DIAGNOSIS — Z79899 Other long term (current) drug therapy: Secondary | ICD-10-CM | POA: Diagnosis not present

## 2023-12-07 DIAGNOSIS — E1110 Type 2 diabetes mellitus with ketoacidosis without coma: Principal | ICD-10-CM | POA: Diagnosis present

## 2023-12-07 DIAGNOSIS — E1122 Type 2 diabetes mellitus with diabetic chronic kidney disease: Secondary | ICD-10-CM | POA: Diagnosis present

## 2023-12-07 DIAGNOSIS — Z7902 Long term (current) use of antithrombotics/antiplatelets: Secondary | ICD-10-CM | POA: Diagnosis not present

## 2023-12-07 DIAGNOSIS — Z794 Long term (current) use of insulin: Secondary | ICD-10-CM | POA: Diagnosis not present

## 2023-12-07 DIAGNOSIS — Z6825 Body mass index (BMI) 25.0-25.9, adult: Secondary | ICD-10-CM

## 2023-12-07 DIAGNOSIS — Z89512 Acquired absence of left leg below knee: Secondary | ICD-10-CM | POA: Diagnosis not present

## 2023-12-07 DIAGNOSIS — R739 Hyperglycemia, unspecified: Secondary | ICD-10-CM | POA: Diagnosis present

## 2023-12-07 DIAGNOSIS — D631 Anemia in chronic kidney disease: Secondary | ICD-10-CM | POA: Diagnosis present

## 2023-12-07 DIAGNOSIS — Z8673 Personal history of transient ischemic attack (TIA), and cerebral infarction without residual deficits: Secondary | ICD-10-CM | POA: Diagnosis not present

## 2023-12-07 DIAGNOSIS — E1100 Type 2 diabetes mellitus with hyperosmolarity without nonketotic hyperglycemic-hyperosmolar coma (NKHHC): Secondary | ICD-10-CM | POA: Insufficient documentation

## 2023-12-07 DIAGNOSIS — Z818 Family history of other mental and behavioral disorders: Secondary | ICD-10-CM | POA: Diagnosis not present

## 2023-12-07 DIAGNOSIS — N1831 Chronic kidney disease, stage 3a: Secondary | ICD-10-CM | POA: Diagnosis present

## 2023-12-07 DIAGNOSIS — R000 Tachycardia, unspecified: Secondary | ICD-10-CM | POA: Diagnosis present

## 2023-12-07 DIAGNOSIS — E1142 Type 2 diabetes mellitus with diabetic polyneuropathy: Secondary | ICD-10-CM | POA: Diagnosis not present

## 2023-12-07 DIAGNOSIS — Z7984 Long term (current) use of oral hypoglycemic drugs: Secondary | ICD-10-CM | POA: Diagnosis not present

## 2023-12-07 DIAGNOSIS — D649 Anemia, unspecified: Secondary | ICD-10-CM | POA: Diagnosis present

## 2023-12-07 DIAGNOSIS — N183 Chronic kidney disease, stage 3 unspecified: Secondary | ICD-10-CM | POA: Diagnosis present

## 2023-12-07 DIAGNOSIS — R634 Abnormal weight loss: Secondary | ICD-10-CM | POA: Diagnosis present

## 2023-12-07 DIAGNOSIS — Z885 Allergy status to narcotic agent status: Secondary | ICD-10-CM

## 2023-12-07 DIAGNOSIS — E872 Acidosis, unspecified: Secondary | ICD-10-CM

## 2023-12-07 DIAGNOSIS — E876 Hypokalemia: Secondary | ICD-10-CM | POA: Diagnosis present

## 2023-12-07 LAB — URINALYSIS, ROUTINE W REFLEX MICROSCOPIC
Bacteria, UA: NONE SEEN
Bilirubin Urine: NEGATIVE
Glucose, UA: >1000 mg/dL — AB
Hgb urine dipstick: NEGATIVE
Ketones, ur: >80 mg/dL — AB
Leukocytes,Ua: NEGATIVE
Nitrite: NEGATIVE
Protein, ur: 30 mg/dL — AB
Specific Gravity, Urine: 1.027 (ref 1.005–1.030)
pH: 5.5 (ref 5.0–8.0)

## 2023-12-07 LAB — BASIC METABOLIC PANEL WITH GFR
Anion gap: 23 — ABNORMAL HIGH (ref 5–15)
BUN: 20 mg/dL (ref 8–23)
CO2: 17 mmol/L — ABNORMAL LOW (ref 22–32)
Calcium: 9.5 mg/dL (ref 8.9–10.3)
Chloride: 89 mmol/L — ABNORMAL LOW (ref 98–111)
Creatinine, Ser: 1.33 mg/dL — ABNORMAL HIGH (ref 0.61–1.24)
GFR, Estimated: 55 mL/min — ABNORMAL LOW (ref >60)
Glucose, Bld: 470 mg/dL — ABNORMAL HIGH (ref 70–99)
Potassium: 4.6 mmol/L (ref 3.5–5.1)
Sodium: 129 mmol/L — ABNORMAL LOW (ref 135–145)

## 2023-12-07 LAB — I-STAT VENOUS BLOOD GAS, ED
Acid-base deficit: 4 mmol/L — ABNORMAL HIGH (ref 0.0–2.0)
Bicarbonate: 19.5 mmol/L — ABNORMAL LOW (ref 20.0–28.0)
Calcium, Ion: 1.16 mmol/L (ref 1.15–1.40)
HCT: 34 % — ABNORMAL LOW (ref 39.0–52.0)
Hemoglobin: 11.6 g/dL — ABNORMAL LOW (ref 13.0–17.0)
O2 Saturation: 58 %
Potassium: 4.8 mmol/L (ref 3.5–5.1)
Sodium: 126 mmol/L — ABNORMAL LOW (ref 135–145)
TCO2: 20 mmol/L — ABNORMAL LOW (ref 22–32)
pCO2, Ven: 29.7 mmHg — ABNORMAL LOW (ref 44–60)
pH, Ven: 7.425 (ref 7.25–7.43)
pO2, Ven: 29 mmHg — CL (ref 32–45)

## 2023-12-07 LAB — CBG MONITORING, ED
Glucose-Capillary: 472 mg/dL — ABNORMAL HIGH (ref 70–99)
Glucose-Capillary: 406 mg/dL — ABNORMAL HIGH (ref 70–99)

## 2023-12-07 LAB — RESP PANEL BY RT-PCR (RSV, FLU A&B, COVID)  RVPGX2
Influenza A by PCR: NEGATIVE
Influenza B by PCR: NEGATIVE
Resp Syncytial Virus by PCR: NEGATIVE
SARS Coronavirus 2 by RT PCR: NEGATIVE

## 2023-12-07 LAB — CBC
HCT: 36.4 % — ABNORMAL LOW (ref 39.0–52.0)
Hemoglobin: 12.6 g/dL — ABNORMAL LOW (ref 13.0–17.0)
MCH: 31.6 pg (ref 26.0–34.0)
MCHC: 34.6 g/dL (ref 30.0–36.0)
MCV: 91.2 fL (ref 80.0–100.0)
Platelets: 235 10*3/uL (ref 150–400)
RBC: 3.99 MIL/uL — ABNORMAL LOW (ref 4.22–5.81)
RDW: 11.9 % (ref 11.5–15.5)
WBC: 9.2 10*3/uL (ref 4.0–10.5)
nRBC: 0 % (ref 0.0–0.2)

## 2023-12-07 LAB — LACTIC ACID, PLASMA: Lactic Acid, Venous: 1.3 mmol/L (ref 0.5–1.9)

## 2023-12-07 MED ORDER — SODIUM CHLORIDE 0.9 % IV BOLUS
1000.0000 mL | Freq: Once | INTRAVENOUS | Status: AC
  Administered 2023-12-07: 1000 mL via INTRAVENOUS

## 2023-12-07 MED ORDER — INSULIN ASPART 100 UNIT/ML IJ SOLN
8.0000 [IU] | Freq: Once | INTRAMUSCULAR | Status: AC
  Administered 2023-12-07: 8 [IU] via SUBCUTANEOUS

## 2023-12-07 MED ORDER — LACTATED RINGERS IV SOLN: INTRAVENOUS | Status: DC

## 2023-12-07 MED ORDER — DEXTROSE IN LACTATED RINGERS 5 % IV SOLN: INTRAVENOUS | Status: DC

## 2023-12-07 MED ORDER — DEXTROSE 50 % IV SOLN: 0.0000 mL | INTRAVENOUS | Status: DC | PRN

## 2023-12-07 MED ORDER — CALCIUM CARBONATE ANTACID 500 MG PO CHEW
800.0000 mg | CHEWABLE_TABLET | Freq: Once | ORAL | Status: AC
  Administered 2023-12-07: 800 mg via ORAL
  Filled 2023-12-07: qty 4

## 2023-12-07 MED ORDER — INSULIN REGULAR(HUMAN) IN NACL 100-0.9 UT/100ML-% IV SOLN
INTRAVENOUS | Status: DC
  Administered 2023-12-07: 15 [IU]/h via INTRAVENOUS
  Administered 2023-12-08: 18 [IU]/h via INTRAVENOUS
  Administered 2023-12-08: 2.8 [IU]/h via INTRAVENOUS
  Administered 2023-12-08: 5 [IU]/h via INTRAVENOUS
  Administered 2023-12-08: 3.2 [IU]/h via INTRAVENOUS
  Filled 2023-12-07: qty 100

## 2023-12-07 NOTE — ED Provider Notes (Signed)
 [place] EMERGENCY DEPARTMENT AT [facility] Provider Note   CSN: [id number] Arrival date & time: 12/07/23  1936     History  Chief Complaint  Patient presents with   Hyperglycemia    Kevin Bradford is a 78 y.o. male.  He is a diabetic and states his sugars are usually under 200.  He took himself off insulin  over a year ago and has not seen his doctor in a while.  He has been taking metformin  along with apple cider vinegar.  He said he did not feel well today with general fatigue feeling hot and cold having some chills.  Wife checked his temperature and found it to be elevated.  Has noticed been urinating more frequently but no dysuria.  No cough chest pain shortness of breath sore throat vomiting diarrhea  The history is provided by the patient and the spouse.  Hyperglycemia Blood sugar level PTA:  400s Duration:  1 day Timing:  Constant Progression:  Unchanged Diabetes status:  Controlled with oral medications Current diabetic therapy:  Metformin  Relieved by:  None tried Ineffective treatments:  None tried Associated symptoms: fatigue, fever and polyuria   Associated symptoms: no abdominal pain, no altered mental status, no chest pain, no dysuria, no shortness of breath and no vomiting        Home Medications Prior to Admission medications   Medication Sig Start Date End Date Taking? Authorizing Provider  atorvastatin  (LIPITOR) 40 MG tablet Take 1 tablet (40 mg total) by mouth daily at 6 PM. 05/08/18   Lavaughn Portland, MD  clopidogrel  (PLAVIX ) 75 MG tablet Take 1 tablet (75 mg total) by mouth daily. 05/09/18   Lavaughn Portland, MD  empagliflozin (JARDIANCE) 10 MG TABS tablet Take 25 mg by mouth daily.     [provider]  insulin  aspart (NOVOLOG  FLEXPEN) 100 UNIT/ML FlexPen Inject 4 Units into the skin 3 (three) times daily with meals. Patient taking differently: Inject 8 Units into the skin 3 (three) times daily with meals.  11/30/17   Angiulli, Everlyn Hockey, PA-C  Insulin   Detemir (LEVEMIR  FLEXTOUCH) 100 UNIT/ML Pen Inject 25 Units into the skin daily at 10 pm. Patient taking differently: Inject 35 Units into the skin daily at 10 pm.  11/30/17   Angiulli, Everlyn Hockey, PA-C  metFORMIN  (GLUCOPHAGE ) 500 MG tablet Take 1 tablet (500 mg total) by mouth 2 (two) times daily with a meal. 11/30/17   Angiulli, Everlyn Hockey, PA-C      Allergies    Oxycodone     Review of Systems   Review of Systems  Constitutional:  Positive for fatigue and fever.  Respiratory:  Negative for shortness of breath.   Cardiovascular:  Negative for chest pain.  Gastrointestinal:  Negative for abdominal pain and vomiting.  Endocrine: Positive for polyuria.  Genitourinary:  Positive for frequency. Negative for dysuria.    Physical Exam Updated Vital Signs BP 115/79 (BP Location: Right Arm)   Pulse (!) 116   Temp 99.7 F (37.6 C) (Oral)   Resp 16   Ht 6\' 2"  (1.88 m)   Wt 88.5 kg   SpO2 98%   BMI 25.04 kg/m  Physical Exam Vitals and nursing note reviewed.  Constitutional:      General: He is not in acute distress.    Appearance: Normal appearance. He is well-developed.  HENT:     Head: Normocephalic and atraumatic.  Eyes:     Conjunctiva/sclera: Conjunctivae normal.  Cardiovascular:     Rate and Rhythm: Regular  rhythm. Tachycardia present.     Heart sounds: No murmur heard. Pulmonary:     Effort: Pulmonary effort is normal. No respiratory distress.     Breath sounds: Normal breath sounds.  Abdominal:     Palpations: Abdomen is soft.     Tenderness: There is no abdominal tenderness. There is no guarding or rebound.  Musculoskeletal:        General: No swelling.     Cervical back: Neck supple.     Comments: Left BKA  Skin:    General: Skin is warm and dry.     Capillary Refill: Capillary refill takes less than 2 seconds.  Neurological:     General: No focal deficit present.     Mental Status: He is alert.     ED Results / Procedures / Treatments   Labs (all labs ordered  are listed, but only abnormal results are displayed) Labs Reviewed  BASIC METABOLIC PANEL WITH GFR - Abnormal; Notable for the following components:      Result Value   Sodium 129 (*)    Chloride 89 (*)    CO2 17 (*)    Glucose, Bld 470 (*)    Creatinine, Ser 1.33 (*)    GFR, Estimated 55 (*)    Anion gap 23 (*)    All other components within normal limits  CBC - Abnormal; Notable for the following components:   RBC 3.99 (*)    Hemoglobin 12.6 (*)    HCT 36.4 (*)    All other components within normal limits  URINALYSIS, ROUTINE W REFLEX MICROSCOPIC - Abnormal; Notable for the following components:   Glucose, UA >1,000 (*)    Ketones, ur >80 (*)    Protein, ur 30 (*)    All other components within normal limits  BETA-HYDROXYBUTYRIC ACID - Abnormal; Notable for the following components:   Beta-Hydroxybutyric Acid 3.60 (*)    All other components within normal limits  OSMOLALITY - Abnormal; Notable for the following components:   Osmolality 297 (*)    All other components within normal limits  BASIC METABOLIC PANEL WITH GFR - Abnormal; Notable for the following components:   Potassium 3.4 (*)    CO2 16 (*)    Glucose, Bld 128 (*)    Calcium  8.5 (*)    Anion gap 18 (*)    All other components within normal limits  BASIC METABOLIC PANEL WITH GFR - Abnormal; Notable for the following components:   Potassium 3.2 (*)    All other components within normal limits  BETA-HYDROXYBUTYRIC ACID - Abnormal; Notable for the following components:   Beta-Hydroxybutyric Acid 0.29 (*)    All other components within normal limits  CBG MONITORING, ED - Abnormal; Notable for the following components:   Glucose-Capillary 472 (*)    All other components within normal limits  CBG MONITORING, ED - Abnormal; Notable for the following components:   Glucose-Capillary 406 (*)    All other components within normal limits  I-STAT VENOUS BLOOD GAS, ED - Abnormal; Notable for the following components:    pCO2, Ven 29.7 (*)    pO2, Ven 29 (*)    Bicarbonate 19.5 (*)    TCO2 20 (*)    Acid-base deficit 4.0 (*)    Sodium 126 (*)    HCT 34.0 (*)    Hemoglobin 11.6 (*)    All other components within normal limits  CBG MONITORING, ED - Abnormal; Notable for the following components:   Glucose-Capillary 403 (*)  All other components within normal limits  CBG MONITORING, ED - Abnormal; Notable for the following components:   Glucose-Capillary 251 (*)    All other components within normal limits  CBG MONITORING, ED - Abnormal; Notable for the following components:   Glucose-Capillary 130 (*)    All other components within normal limits  CBG MONITORING, ED - Abnormal; Notable for the following components:   Glucose-Capillary 161 (*)    All other components within normal limits  CBG MONITORING, ED - Abnormal; Notable for the following components:   Glucose-Capillary 170 (*)    All other components within normal limits  CBG MONITORING, ED - Abnormal; Notable for the following components:   Glucose-Capillary 102 (*)    All other components within normal limits  CBG MONITORING, ED - Abnormal; Notable for the following components:   Glucose-Capillary 169 (*)    All other components within normal limits  CBG MONITORING, ED - Abnormal; Notable for the following components:   Glucose-Capillary 174 (*)    All other components within normal limits  RESP PANEL BY RT-PCR (RSV, FLU A&B, COVID)  RVPGX2  CULTURE, BLOOD (ROUTINE X 2)  CULTURE, BLOOD (ROUTINE X 2)  LACTIC ACID, PLASMA  HEMOGLOBIN A1C  CBG MONITORING, ED    EKG None  Radiology DG Chest Port 1 View Result Date: 12/07/2023 CLINICAL DATA:  Fever and malaise EXAM: PORTABLE CHEST 1 VIEW COMPARISON:  Chest x-ray 05/06/2018 FINDINGS: The heart size and mediastinal contours are within normal limits. Both lungs are clear. The visualized skeletal structures are unremarkable. IMPRESSION: No active disease. Electronically Signed   By: Tyron Gallon M.D.   On: 12/07/2023 21:49    Procedures .Critical Care  Performed by: Tonya Fredrickson, MD Authorized by: Tonya Fredrickson, MD   Critical care provider statement:    Critical care time (minutes):  45   Critical care time was exclusive of:  Separately billable procedures and treating other patients   Critical care was necessary to treat or prevent imminent or life-threatening deterioration of the following conditions:  Endocrine crisis   Critical care was time spent personally by me on the following activities:  Development of treatment plan with patient or surrogate, discussions with consultants, evaluation of patient's response to treatment, examination of patient, obtaining history from patient or surrogate, ordering and performing treatments and interventions, ordering and review of laboratory studies, ordering and review of radiographic studies, pulse oximetry, re-evaluation of patient's condition and review of old charts   I assumed direction of critical care for this patient from another provider in my specialty: no       Medications Ordered in ED Medications  lactated ringers  infusion (0 mLs Intravenous Stopped 12/08/23 0248)  dextrose  5 % in lactated ringers  infusion ( Intravenous New Bag/Given 12/08/23 0342)  dextrose  50 % solution 0-50 mL (has no administration in time range)  potassium chloride (KLOR-CON) packet 40 mEq (40 mEq Oral Given 12/08/23 0907)  lactated ringers  infusion (has no administration in time range)  insulin  glargine-yfgn (SEMGLEE) injection 10 Units (10 Units Subcutaneous Given 12/08/23 0924)  insulin  aspart (novoLOG ) injection 0-9 Units (has no administration in time range)  insulin  aspart (novoLOG ) injection 0-5 Units (has no administration in time range)  insulin  regular, human (MYXREDLIN) 100 units/ 100 mL infusion (3 Units/hr Intravenous Rate/Dose Change 12/08/23 0928)  sodium chloride  0.9 % bolus 1,000 mL (0 mLs Intravenous Stopped 12/07/23 2346)   insulin  aspart (novoLOG ) injection 8 Units (8 Units Subcutaneous Given 12/07/23 2246)  calcium  carbonate (TUMS - dosed in mg elemental calcium ) chewable tablet 800 mg of elemental calcium  (800 mg of elemental calcium  Oral Given 12/07/23 2332)    ED Course/ Medical Decision Making/ A&P Clinical Course as of 12/08/23 0947  Thu Dec 07, 2023  2217 Patient does have elevated anion gap and low bicarb.  VBG with normal pH. [MB]  2301 EKG did not cross in epic.  Sinus tachycardia rate of 105, no acute ST-T changes [MB]  2328 Discussed with Dr. Sundil [facility] hospitalist who will evaluate patient for admission.  I reviewed this with the patient and he is agreeable to plan for admission. [MB]    Clinical Course User Index [MB] Tonya Fredrickson, MD                                 Medical Decision Making Amount and/or Complexity of Data Reviewed Labs: ordered. Radiology: ordered.  Risk OTC drugs. Prescription drug management. Decision regarding hospitalization.   This patient complains of fatigue chills elevated blood sugar; this involves an extensive number of treatment Options and is a complaint that carries with it a high risk of complications and morbidity. The differential includes infection,included, sepsis, DKA, hyperglycemia, HHNK  I ordered, reviewed and interpreted labs I ordered medication IV insulin  and fluids and reviewed PMP when indicated. I ordered imaging studies which included chest x-ray and I independently    visualized and interpreted imaging which showed no acute findings Additional history obtained from patient's wife Previous records obtained and reviewed in epic no recent admissions I consulted [facility] hospitalist Dr. Ulice Gamer and discussed lab and imaging findings and discussed disposition.  Cardiac monitoring reviewed, sinus tachycardia Social determinants considered, no significant barriers Critical Interventions: Initiation of IV insulin  for hyperglycemia and  acidosis  After the interventions stated above, I reevaluated the patient and found patient to be fairly asymptomatic Admission and further testing considered, he would benefit from mission to the hospital for correction of his metabolic derangement.  He is very hesitant to be admitted although ultimately he agreed understanding the seriousness of his condition.  Awaiting transport team and bed assignment.  Care signed out to Dr. Wallis Gun         Final Clinical Impression(s) / ED Diagnoses Final diagnoses:  Hyperglycemia  Metabolic acidosis    Rx / DC Orders ED Discharge Orders     None         Tonya Fredrickson, MD 12/08/23 1002

## 2023-12-07 NOTE — ED Notes (Signed)
 RT obtained VBG from pt PIV. PIV flushed and saline locked post collection. Results given to MD Randal Bury.  Latest Reference Range & Units 12/07/23 21:52  Sample type  VENOUS  pH, Ven 7.25 - 7.43  7.425  pCO2, Ven 44 - 60 mmHg 29.7 (L)  pO2, Ven 32 - 45 mmHg 29 (LL)  TCO2 22 - 32 mmol/L 20 (L)  Acid-base deficit 0.0 - 2.0 mmol/L 4.0 (H)  Bicarbonate 20.0 - 28.0 mmol/L 19.5 (L)  O2 Saturation % 58  (LL): Data is critically low (L): Data is abnormally low (H): Data is abnormally high

## 2023-12-07 NOTE — Hospital Course (Signed)
 [facility] transfer: 78 year old man history of DM type II not on insulin  anymore presented to emergency department with complaining of fatigue, feeling hot and cold sensation with chills.  Patient reported he has history of DM states his sugars are usually under 200.  He took himself off insulin  over a year ago and has not seen his doctor in a while.  He has been taking metformin  along with apple cider vinegar.  He said he did not feel well today with general fatigue feeling hot and cold having some chills.  Wife checked his temperature and found it to be elevated.  Has noticed been urinating more frequently but no dysuria.  No cough chest pain shortness of breath sore throat vomiting diarrhea. At presentation to ED patient found tachycardic heart rate 116 otherwise hemodynamically stable. BMP showing elevated blood glucose 470, corrected sodium 131, low bicarb 17, elevated anion gap 23.  Elevated creatinine 1.33 (at baseline patient has GFR). VBG showing pH 7.4, low bicarb 19. Pending beta-hydroxybutyrate level. Normal lactic acid level. CBC unremarkable. Chest x-ray no active disease process.  In the ED patient has been started on insulin  drip for HHS protocol.  Patient also received 1 L of NS bolus.  Hospitalist has been consulted for management for hyperosmolar hyperglycemic crisis.

## 2023-12-07 NOTE — ED Triage Notes (Signed)
 Pt POV reporting increased fatigue and chills today, DM2, CBG in triage 472, currently taking metformin .

## 2023-12-08 ENCOUNTER — Other Ambulatory Visit (HOSPITAL_COMMUNITY): Payer: Self-pay

## 2023-12-08 ENCOUNTER — Telehealth (HOSPITAL_COMMUNITY): Payer: Self-pay | Admitting: Pharmacy Technician

## 2023-12-08 DIAGNOSIS — E876 Hypokalemia: Secondary | ICD-10-CM | POA: Diagnosis not present

## 2023-12-08 DIAGNOSIS — E1122 Type 2 diabetes mellitus with diabetic chronic kidney disease: Secondary | ICD-10-CM | POA: Diagnosis not present

## 2023-12-08 DIAGNOSIS — E1142 Type 2 diabetes mellitus with diabetic polyneuropathy: Secondary | ICD-10-CM | POA: Diagnosis not present

## 2023-12-08 DIAGNOSIS — R000 Tachycardia, unspecified: Secondary | ICD-10-CM | POA: Diagnosis not present

## 2023-12-08 DIAGNOSIS — Z6825 Body mass index (BMI) 25.0-25.9, adult: Secondary | ICD-10-CM | POA: Diagnosis not present

## 2023-12-08 DIAGNOSIS — Z79899 Other long term (current) drug therapy: Secondary | ICD-10-CM | POA: Diagnosis not present

## 2023-12-08 DIAGNOSIS — Z7902 Long term (current) use of antithrombotics/antiplatelets: Secondary | ICD-10-CM | POA: Diagnosis not present

## 2023-12-08 DIAGNOSIS — D649 Anemia, unspecified: Secondary | ICD-10-CM

## 2023-12-08 DIAGNOSIS — E1110 Type 2 diabetes mellitus with ketoacidosis without coma: Secondary | ICD-10-CM | POA: Diagnosis not present

## 2023-12-08 DIAGNOSIS — N1831 Chronic kidney disease, stage 3a: Secondary | ICD-10-CM | POA: Diagnosis not present

## 2023-12-08 DIAGNOSIS — Z885 Allergy status to narcotic agent status: Secondary | ICD-10-CM | POA: Diagnosis not present

## 2023-12-08 DIAGNOSIS — Z89512 Acquired absence of left leg below knee: Secondary | ICD-10-CM | POA: Diagnosis not present

## 2023-12-08 DIAGNOSIS — R739 Hyperglycemia, unspecified: Secondary | ICD-10-CM | POA: Diagnosis present

## 2023-12-08 DIAGNOSIS — R634 Abnormal weight loss: Secondary | ICD-10-CM | POA: Diagnosis not present

## 2023-12-08 DIAGNOSIS — Z794 Long term (current) use of insulin: Secondary | ICD-10-CM | POA: Diagnosis not present

## 2023-12-08 DIAGNOSIS — D631 Anemia in chronic kidney disease: Secondary | ICD-10-CM | POA: Diagnosis not present

## 2023-12-08 DIAGNOSIS — Z8673 Personal history of transient ischemic attack (TIA), and cerebral infarction without residual deficits: Secondary | ICD-10-CM | POA: Diagnosis not present

## 2023-12-08 DIAGNOSIS — Z7984 Long term (current) use of oral hypoglycemic drugs: Secondary | ICD-10-CM | POA: Diagnosis not present

## 2023-12-08 DIAGNOSIS — Z818 Family history of other mental and behavioral disorders: Secondary | ICD-10-CM | POA: Diagnosis not present

## 2023-12-08 LAB — BASIC METABOLIC PANEL WITH GFR
Anion gap: 18 — ABNORMAL HIGH (ref 5–15)
Anion gap: 14 (ref 5–15)
BUN: 15 mg/dL (ref 8–23)
BUN: 16 mg/dL (ref 8–23)
CO2: 16 mmol/L — ABNORMAL LOW (ref 22–32)
CO2: 23 mmol/L (ref 22–32)
Calcium: 8.5 mg/dL — ABNORMAL LOW (ref 8.9–10.3)
Calcium: 9.6 mg/dL (ref 8.9–10.3)
Chloride: 100 mmol/L (ref 98–111)
Chloride: 99 mmol/L (ref 98–111)
Creatinine, Ser: 1.04 mg/dL (ref 0.61–1.24)
Creatinine, Ser: 1.21 mg/dL (ref 0.61–1.24)
GFR, Estimated: >60 mL/min (ref >60)
GFR, Estimated: >60 mL/min (ref >60)
Glucose, Bld: 128 mg/dL — ABNORMAL HIGH (ref 70–99)
Glucose, Bld: 82 mg/dL (ref 70–99)
Potassium: 3.4 mmol/L — ABNORMAL LOW (ref 3.5–5.1)
Potassium: 3.2 mmol/L — ABNORMAL LOW (ref 3.5–5.1)
Sodium: 135 mmol/L (ref 135–145)
Sodium: 136 mmol/L (ref 135–145)

## 2023-12-08 LAB — MRSA NEXT GEN BY PCR, NASAL: MRSA by PCR Next Gen: NOT DETECTED

## 2023-12-08 LAB — CBG MONITORING, ED
Glucose-Capillary: 102 mg/dL — ABNORMAL HIGH (ref 70–99)
Glucose-Capillary: 130 mg/dL — ABNORMAL HIGH (ref 70–99)
Glucose-Capillary: 161 mg/dL — ABNORMAL HIGH (ref 70–99)
Glucose-Capillary: 169 mg/dL — ABNORMAL HIGH (ref 70–99)
Glucose-Capillary: 170 mg/dL — ABNORMAL HIGH (ref 70–99)
Glucose-Capillary: 172 mg/dL — ABNORMAL HIGH (ref 70–99)
Glucose-Capillary: 174 mg/dL — ABNORMAL HIGH (ref 70–99)
Glucose-Capillary: 251 mg/dL — ABNORMAL HIGH (ref 70–99)
Glucose-Capillary: 403 mg/dL — ABNORMAL HIGH (ref 70–99)
Glucose-Capillary: 93 mg/dL (ref 70–99)

## 2023-12-08 LAB — BETA-HYDROXYBUTYRIC ACID
Beta-Hydroxybutyric Acid: 3.6 mmol/L — ABNORMAL HIGH (ref 0.05–0.27)
Beta-Hydroxybutyric Acid: 0.29 mmol/L — ABNORMAL HIGH (ref 0.05–0.27)

## 2023-12-08 LAB — GLUCOSE, CAPILLARY
Glucose-Capillary: 136 mg/dL — ABNORMAL HIGH (ref 70–99)
Glucose-Capillary: 221 mg/dL — ABNORMAL HIGH (ref 70–99)
Glucose-Capillary: 257 mg/dL — ABNORMAL HIGH (ref 70–99)

## 2023-12-08 LAB — HEMOGLOBIN A1C
Hgb A1c MFr Bld: 13.7 % — ABNORMAL HIGH (ref 4.8–5.6)
Mean Plasma Glucose: 346.49 mg/dL

## 2023-12-08 LAB — OSMOLALITY: Osmolality: 297 mosm/kg — ABNORMAL HIGH (ref 275–295)

## 2023-12-08 MED ORDER — INSULIN GLARGINE-YFGN 100 UNIT/ML ~~LOC~~ SOLN
15.0000 [IU] | SUBCUTANEOUS | Status: DC
  Administered 2023-12-09: 15 [IU] via SUBCUTANEOUS
  Filled 2023-12-08: qty 0.15

## 2023-12-08 MED ORDER — POTASSIUM CHLORIDE CRYS ER 20 MEQ PO TBCR
40.0000 meq | EXTENDED_RELEASE_TABLET | Freq: Once | ORAL | Status: AC
  Administered 2023-12-08: 40 meq via ORAL
  Filled 2023-12-08: qty 2

## 2023-12-08 MED ORDER — ACETAMINOPHEN 650 MG RE SUPP: 650.0000 mg | Freq: Four times a day (QID) | RECTAL | Status: DC | PRN

## 2023-12-08 MED ORDER — ONDANSETRON HCL 4 MG/2ML IJ SOLN: 4.0000 mg | Freq: Four times a day (QID) | INTRAMUSCULAR | Status: DC | PRN

## 2023-12-08 MED ORDER — INSULIN GLARGINE-YFGN 100 UNIT/ML ~~LOC~~ SOLN
10.0000 [IU] | Freq: Every day | SUBCUTANEOUS | Status: DC
  Administered 2023-12-08: 10 [IU] via SUBCUTANEOUS
  Filled 2023-12-08: qty 0.1
  Filled 2023-12-08: qty 100

## 2023-12-08 MED ORDER — SODIUM CHLORIDE 0.9% FLUSH
3.0000 mL | Freq: Two times a day (BID) | INTRAVENOUS | Status: DC
  Administered 2023-12-08: 3 mL via INTRAVENOUS

## 2023-12-08 MED ORDER — INSULIN ASPART 100 UNIT/ML IJ SOLN
0.0000 [IU] | Freq: Every day | INTRAMUSCULAR | Status: DC
  Administered 2023-12-08: 3 [IU] via SUBCUTANEOUS

## 2023-12-08 MED ORDER — ONDANSETRON HCL 4 MG PO TABS: 4.0000 mg | ORAL_TABLET | Freq: Four times a day (QID) | ORAL | Status: DC | PRN

## 2023-12-08 MED ORDER — POTASSIUM CHLORIDE 20 MEQ PO PACK
40.0000 meq | PACK | Freq: Two times a day (BID) | ORAL | Status: DC
Start: 2023-12-08 — End: 2023-12-08

## 2023-12-08 MED ORDER — POTASSIUM CHLORIDE 20 MEQ PO PACK
40.0000 meq | PACK | Freq: Two times a day (BID) | ORAL | Status: AC
  Administered 2023-12-08: 40 meq via ORAL
  Filled 2023-12-08 (×2): qty 2

## 2023-12-08 MED ORDER — LACTATED RINGERS IV SOLN: INTRAVENOUS | Status: AC

## 2023-12-08 MED ORDER — ACETAMINOPHEN 325 MG PO TABS: 650.0000 mg | ORAL_TABLET | Freq: Four times a day (QID) | ORAL | Status: DC | PRN

## 2023-12-08 MED ORDER — ENOXAPARIN SODIUM 40 MG/0.4ML IJ SOSY
40.0000 mg | PREFILLED_SYRINGE | INTRAMUSCULAR | Status: DC
  Administered 2023-12-08: 40 mg via SUBCUTANEOUS
  Filled 2023-12-08: qty 0.4

## 2023-12-08 MED ORDER — INSULIN ASPART 100 UNIT/ML IJ SOLN
0.0000 [IU] | Freq: Three times a day (TID) | INTRAMUSCULAR | Status: DC
  Administered 2023-12-09: 5 [IU] via SUBCUTANEOUS

## 2023-12-08 MED ORDER — INSULIN ASPART 100 UNIT/ML IJ SOLN
0.0000 [IU] | Freq: Three times a day (TID) | INTRAMUSCULAR | Status: DC
  Administered 2023-12-08: 3 [IU] via SUBCUTANEOUS
  Administered 2023-12-08: 1 [IU] via SUBCUTANEOUS

## 2023-12-08 MED ORDER — INSULIN GLARGINE-YFGN 100 UNIT/ML ~~LOC~~ SOLN
5.0000 [IU] | Freq: Once | SUBCUTANEOUS | Status: AC
  Administered 2023-12-08: 5 [IU] via SUBCUTANEOUS
  Filled 2023-12-08: qty 0.05

## 2023-12-08 MED ORDER — INSULIN REGULAR(HUMAN) IN NACL 100-0.9 UT/100ML-% IV SOLN: INTRAVENOUS | Status: DC

## 2023-12-08 MED ORDER — ALBUTEROL SULFATE (2.5 MG/3ML) 0.083% IN NEBU: 2.5000 mg | INHALATION_SOLUTION | Freq: Four times a day (QID) | RESPIRATORY_TRACT | Status: DC | PRN

## 2023-12-08 NOTE — Telephone Encounter (Signed)
 Pharmacy Patient Advocate Encounter   Received notification from Inpatient Request that prior authorization for FreeStyle Libre 3 Plus Sensor is required/requested.   Insurance verification completed.   The patient is insured through [employer] .   Per test claim: PA required; PA submitted to above mentioned insurance via CoverMyMeds Key/confirmation #/EOC [id number] Status is pending

## 2023-12-08 NOTE — Plan of Care (Signed)
  Problem: Education: Goal: Knowledge of General Education information will improve Description: Including pain rating scale, medication(s)/side effects and non-pharmacologic comfort measures Outcome: Progressing   Problem: Clinical Measurements: Goal: Ability to maintain clinical measurements within normal limits will improve 12/08/2023 1724 by Branson Calandra D, RN Outcome: Progressing 12/08/2023 1315 by Climmie Damme, RN Outcome: Progressing Goal: Will remain free from infection Outcome: Progressing Goal: Diagnostic test results will improve Outcome: Progressing Goal: Respiratory complications will improve Outcome: Progressing Goal: Cardiovascular complication will be avoided Outcome: Progressing   Problem: Nutrition: Goal: Adequate nutrition will be maintained 12/08/2023 1724 by Climmie Damme, RN Outcome: Progressing 12/08/2023 1315 by Branson Calandra D, RN Outcome: Progressing   Problem: Pain Managment: Goal: General experience of comfort will improve and/or be controlled 12/08/2023 1724 by Climmie Damme, RN Outcome: Progressing 12/08/2023 1315 by Climmie Damme, RN Outcome: Progressing
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
## 2023-12-08 NOTE — ED Provider Notes (Signed)
  Physical Exam  BP (!) 113/54   Pulse 92   Temp 98.7 F (37.1 C) (Oral)   Resp 20   Ht 1.88 m (6\' 2" )   Wt 88.5 kg   SpO2 100%   BMI 25.04 kg/m   Physical Exam  Procedures  Procedures  ED Course / MDM   Clinical Course as of 12/08/23 0856  Thu Dec 07, 2023  2217 Patient does have elevated anion gap and low bicarb.  VBG with normal pH. [MB]  2301 EKG did not cross in epic.  Sinus tachycardia rate of 105, no acute ST-T changes [MB]  2328 Discussed with Dr. Sundil Triad hospitalist who will evaluate patient for admission.  I reviewed this with the patient and he is agreeable to plan for admission. [MB]    Clinical Course User Index [MB] Tonya Fredrickson, MD   Medical Decision Making Amount and/or Complexity of Data Reviewed Labs: ordered. Radiology: ordered.  Risk OTC drugs. Prescription drug management. Decision regarding hospitalization.   78 year old man history of type 2 diabetes admitted yesterday with DKA.  Patient has been NPO.  He has been requesting discharge for several hours.  Repeat labs were obtained.  Basic metabolic panel currently has sodium 136, potassium 3.2, CO2 to 23, beta hydroxybutyric acid at 0.29 and last glucose A169.  Patient has been NPO.  He is continuing on insulin  drip at this time pending clearing beta hydroxybutyric acid. Potassium replenished at 40 mEq p.o. today. Awaiting call from hospitalist to discuss progressing care Extensive discussion with Dr. Felipe Horton.  Plan to stop drip, start subcu insulin , begin diet and reevaluate.  Awaiting back from diabetic coordinator Bed currently is ready.  We have discussed risks and benefits of going ahead and transferring the patient with nursing.  Given the fact that he has not been on insulin  for a while it is unclear how he will respond, we will go ahead and transfer him even though he may get discharged later today.     Auston Blush, MD 12/12/23 [phone]

## 2023-12-08 NOTE — Telephone Encounter (Signed)
 Pharmacy Patient Advocate Encounter  Received notification from [employer] that Prior Authorization for FreeStyle Libre 3 Plus Sensor  has been APPROVED from 12/08/2023 to 07/17/2024. Ran test claim, Copay is $0.00. This test claim was processed through [facility]- copay amounts may vary at other pharmacies due to pharmacy/plan contracts, or as the patient moves through the different stages of their insurance plan.   PA #/Case ID/Reference #: [id number]

## 2023-12-08 NOTE — Inpatient Diabetes Management (Signed)
 Inpatient Diabetes Program Recommendations  AACE/ADA: New Consensus Statement on Inpatient Glycemic Control (2015)  Target Ranges:  Prepandial:   less than 140 mg/dL      Peak postprandial:   less than 180 mg/dL (1-2 hours)      Critically ill patients:  140 - 180 mg/dL   Lab Results  Component Value Date   GLUCAP 136 (H) 12/08/2023   HGBA1C 13.7 (H) 12/08/2023    Review of Glycemic Control  Latest Reference Range & Units 12/08/23 11:35  Glucose-Capillary 70 - 99 mg/dL 086 (H)  (H): Data is abnormally high Diabetes history: DM2 Outpatient Diabetes medications: Metformin  1 gm bid Current orders for Inpatient glycemic control: IV insulin  to transition to Semglee 10 units every day, Novolog  0-9 units TID & HS  Inpatient Diabetes Program Recommendations:    Discharge Recommendations: Long acting recommendations: Insulin  Glargine (LANTUS) Solostar Pen 10 units QD or adjust per MD  Supply/Referral recommendations: Glucometer Test strips Lancet device Lancets Pen needles - standard   Use Adult Diabetes Insulin  Treatment Post Discharge order set.  Spoke with patient and wife regarding outpatient diabetes management. Has given insulin  before and stopped due to previous issue with hypoglycemia. Reports that this occurred several ago and has not seen a doctor since. States, "I know what I need to do." Reviewed patient's current A1c of 13.7%. Explained what a A1c is and what it measures. Also reviewed goal A1c with patient, importance of good glucose control @ home, and blood sugar goals. Reviewed patho of DM, role of insulin , signs and symptoms of hypo vs hyperglycemia, interventions, vascular changes, need for insulin  at home, differences between long acting vs short acting insulin , importance of seeing MD, and other commorbidities.  Patient has a meter and test strips. Reviewed recommended frequency for checking CBGs and when to call MD.  Reviewed importance of CHO mindfulness, foods  that are higher in CHO, importance of protein and alternatives to sugary beverages. Patient is not interested in outpatient education, CGM, or seeing insulin  pen demo. Very clear that he understands.  No additional questions.   Thanks, Marjo Sievert, MSN, RNC-OB Diabetes Coordinator [phone] (8a-5p)

## 2023-12-08 NOTE — TOC CM/SW Note (Addendum)
 Transition of Care [facility]) - Inpatient Brief Assessment   Patient Details  Name: Kevin Bradford MRN: [id number] Date of Birth: [birth year]  Transition of Care [facility]) CM/SW Contact:    Juliane Och, LCSW Phone Number: 12/08/2023, 3:32 PM   Clinical Narrative:  3:32 PM Per chart review, patient has a PCP and insurance. Patient does not have SNF history. Patient has HH history with [facility]. Patient has DME history (cane prosthesis, glucose monitor, rolling walker). Patient has CIR history. CSW informed bedside RN of SDOH updating needs. No other TOC needs were identified at this time. TOC will continue to follow and be available to assist.  Transition of Care Asessment: Insurance and Status: Insurance coverage has been reviewed Patient has primary care physician: Yes Home environment has been reviewed: Private Residence Prior level of function:: N/A Prior/Current Home Services: No current home services (Has HH/DME history) Social Drivers of Health Review:  (SDOH needs updating) Readmission risk has been reviewed: Yes Transition of care needs: no transition of care needs at this time

## 2023-12-08 NOTE — ED Notes (Signed)
 Fluids and IV insulin  stopped due to patient requesting to go home.  Asked ER provider to speak with patient after attempting to encourage patient to stay and continue treatment.  After discussion with provider patient would like to wait and make a decision after BMP results come back.  Awaiting results to inform patient.  Will continue to monitor patient.

## 2023-12-08 NOTE — Plan of Care (Signed)
  Problem: Clinical Measurements: Goal: Ability to maintain clinical measurements within normal limits will improve Outcome: Progressing   Problem: Nutrition: Goal: Adequate nutrition will be maintained Outcome: Progressing   Problem: Pain Managment: Goal: General experience of comfort will improve and/or be controlled Outcome: Progressing

## 2023-12-08 NOTE — ED Provider Notes (Signed)
.  Critical Care  Performed by: Eldon Greenland, MD Authorized by: Eldon Greenland, MD   Critical care provider statement:    Critical care time (minutes):  45   Critical care start time:  12/08/2023 3:00 AM   Critical care end time:  12/08/2023 3:45 AM   Critical care was necessary to treat or prevent imminent or life-threatening deterioration of the following conditions:  Metabolic crisis, dehydration and endocrine crisis   Critical care was time spent personally by me on the following activities:  Examination of patient, ordering and review of laboratory studies, re-evaluation of patient's condition and development of treatment plan with patient or surrogate   I assumed direction of critical care for this patient from another provider in my specialty: yes    I have assumed care of patient while he was awaiting admission for diabetic ketoacidosis Overall patient is improving, has been receiving insulin .  Patient told nursing staff he would like to be discharged. I had multiple extended conversations with patient about the nature of diabetic ketoacidosis and the need for continued insulin  in the setting of ketoacidosis I informed him that we often have to start dextrose  at the same time as insulin  is a treatment for DKA He did agree to stay for repeat testing. Repeat electrolyte panel confirms anion gap as well as bicarb of 16 advised admission and continued insulin  Patient is agreeable for now   Eldon Greenland, MD 12/08/23 [phone]

## 2023-12-08 NOTE — ED Notes (Signed)
 Paged the hospitalist  and diabetes Coordinator to Dr. Synetta Eves

## 2023-12-08 NOTE — Progress Notes (Signed)
 PHARMACY ROUNDING NOTE  Kevin Bradford is a 78 y.o. male awaiting admission. A chart review was completed to evaluate prior to admission medications, antibiotic therapy and labs/vitals. The following interventions were made:  Supplemented potassium  This was discussed with the ED or admitting provider and/or nurse/paramedic.   Jerri Morale, PharmD, BCPS, BCEMP Clinical Pharmacist Please see AMION for all pharmacy numbers 12/08/2023 8:00 AM

## 2023-12-08 NOTE — Telephone Encounter (Signed)
 Patient Product/process development scientist completed.    The patient is insured through [employer]. Patient has Medicare and is not eligible for a copay card, but may be able to apply for patient assistance or Medicare RX Payment Plan (Patient Must reach out to their plan, if eligible for payment plan), if available.    Ran test claim for Lantus Pen and the current 30 day co-pay is $35.00.  Ran test claim for Novolog FlexPen and the current 30 day co-pay is $35.00.  Ran test claim for Dexcom G7 Sensor and Requires Prior Authorization  Ran test claim for [employer] 3 Plus Sensor and Requires Prior Authorization  This test claim was processed through [employer] [facility]- copay amounts may vary at other pharmacies due to [employer], or as the patient moves through the different stages of their insurance plan.     Morgan Arab, CPHT Pharmacy Technician III Certified Patient Advocate [facility] Pharmacy Patient Advocate Team Direct Number: [phone]  Fax: [phone]

## 2023-12-08 NOTE — ED Notes (Signed)
 [facility] is transporting patient to [facility] rm# [id number]

## 2023-12-08 NOTE — Inpatient Diabetes Management (Signed)
 Inpatient Diabetes Program Recommendations  AACE/ADA: New Consensus Statement on Inpatient Glycemic Control (2015)  Target Ranges:  Prepandial:   less than 140 mg/dL      Peak postprandial:   less than 180 mg/dL (1-2 hours)      Critically ill patients:  140 - 180 mg/dL   Lab Results  Component Value Date   GLUCAP 169 (H) 12/08/2023   HGBA1C 8.2 (H) 05/07/2018    Latest Reference Range & Units 12/07/23 20:01  Sodium 135 - 145 mmol/L 129 (L)  Potassium 3.5 - 5.1 mmol/L 4.6  Chloride 98 - 111 mmol/L 89 (L)  CO2 22 - 32 mmol/L 17 (L)  Glucose 70 - 99 mg/dL 081 (H)  BUN 8 - 23 mg/dL 20  Creatinine 4.48 - 1.85 mg/dL 6.31 (H)  Calcium  8.9 - 10.3 mg/dL 9.5  Anion gap 5 - 15  23 (H)  (L): Data is abnormally low (H): Data is abnormally high  Latest Reference Range & Units 12/08/23 06:39  Sodium 135 - 145 mmol/L 136  Potassium 3.5 - 5.1 mmol/L 3.2 (L)  Chloride 98 - 111 mmol/L 99  CO2 22 - 32 mmol/L 23  Glucose 70 - 99 mg/dL 82  BUN 8 - 23 mg/dL 16  Creatinine 4.97 - 0.26 mg/dL 3.78  Calcium  8.9 - 10.3 mg/dL 9.6  Anion gap 5 - 15  14  (L): Data is abnormally low  Diabetes history: DM2 Outpatient Diabetes medications: Metformin  1 gm bid Current orders for Inpatient glycemic control: IV insulin   Pending A1c   Inpatient Diabetes Program Recommendations:   Spoke with Dr. Synetta Eves regarding patient @ [facility] admitted to ED in DKA. Patient is only taking Metformin  1 gm bid for diabetes. Plans to transfer patient to [facility] hospital for admission. Will follow while hospitalized.   Thank you, Danity Schmelzer E. Alexavier Tsutsui, RN, MSN, CDCES  Diabetes Coordinator Inpatient Glycemic Control Team Team Pager [phone] (8am-5pm) 12/08/2023 9:29 AM

## 2023-12-09 DIAGNOSIS — R739 Hyperglycemia, unspecified: Secondary | ICD-10-CM | POA: Diagnosis not present

## 2023-12-09 DIAGNOSIS — E1110 Type 2 diabetes mellitus with ketoacidosis without coma: Secondary | ICD-10-CM | POA: Diagnosis not present

## 2023-12-09 LAB — BASIC METABOLIC PANEL WITH GFR
Anion gap: 11 (ref 5–15)
BUN: 10 mg/dL (ref 8–23)
CO2: 21 mmol/L — ABNORMAL LOW (ref 22–32)
Calcium: 8.8 mg/dL — ABNORMAL LOW (ref 8.9–10.3)
Chloride: 101 mmol/L (ref 98–111)
Creatinine, Ser: 1.17 mg/dL (ref 0.61–1.24)
GFR, Estimated: >60 mL/min (ref >60)
Glucose, Bld: 178 mg/dL — ABNORMAL HIGH (ref 70–99)
Potassium: 4 mmol/L (ref 3.5–5.1)
Sodium: 133 mmol/L — ABNORMAL LOW (ref 135–145)

## 2023-12-09 LAB — GLUCOSE, CAPILLARY: Glucose-Capillary: 202 mg/dL — ABNORMAL HIGH (ref 70–99)

## 2023-12-09 LAB — TSH: TSH: 1.145 u[IU]/mL (ref 0.350–4.500)

## 2023-12-09 MED ORDER — NOVOLOG FLEXPEN 100 UNIT/ML ~~LOC~~ SOPN: 4.0000 [IU] | PEN_INJECTOR | Freq: Three times a day (TID) | SUBCUTANEOUS | 11 refills | Status: DC

## 2023-12-09 MED ORDER — LEVEMIR FLEXTOUCH 100 UNIT/ML ~~LOC~~ SOPN: 25.0000 [IU] | PEN_INJECTOR | Freq: Every day | SUBCUTANEOUS | 2 refills | Status: DC

## 2023-12-09 MED ORDER — CLOPIDOGREL BISULFATE 75 MG PO TABS: 75.0000 mg | ORAL_TABLET | Freq: Every day | ORAL | 6 refills | Status: DC

## 2023-12-09 NOTE — Discharge Instructions (Signed)
 Advised to follow-up with primary care physician in 1 week. Advised to follow-up with endocrinologist as scheduled. Emphasized on medication compliance and its consequences.

## 2023-12-09 NOTE — Plan of Care (Signed)

## 2023-12-09 NOTE — Progress Notes (Signed)
 Went over discharge paperwork with patient. All questions answered. PIV/Telemetry removed. All belongings at bedside.
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
## 2023-12-13 LAB — CULTURE, BLOOD (ROUTINE X 2)
Culture: NO GROWTH
Culture: NO GROWTH
Special Requests: ADEQUATE
Special Requests: ADEQUATE

## 2023-12-15 ENCOUNTER — Inpatient Hospital Stay (HOSPITAL_COMMUNITY)
Admission: EM | Admit: 2023-12-15 | Discharge: 2023-12-25 | DRG: 617 | Disposition: A | Discharge status: Rehabilitation Facility | Attending: Family Medicine | Admitting: Family Medicine

## 2023-12-15 ENCOUNTER — Other Ambulatory Visit: Payer: Self-pay

## 2023-12-15 ENCOUNTER — Encounter (HOSPITAL_COMMUNITY): Payer: Self-pay | Admitting: *Deleted

## 2023-12-15 DIAGNOSIS — K5900 Constipation, unspecified: Secondary | ICD-10-CM | POA: Diagnosis present

## 2023-12-15 DIAGNOSIS — Z9841 Cataract extraction status, right eye: Secondary | ICD-10-CM

## 2023-12-15 DIAGNOSIS — E1122 Type 2 diabetes mellitus with diabetic chronic kidney disease: Secondary | ICD-10-CM | POA: Diagnosis present

## 2023-12-15 DIAGNOSIS — D62 Acute posthemorrhagic anemia: Secondary | ICD-10-CM | POA: Diagnosis not present

## 2023-12-15 DIAGNOSIS — J9811 Atelectasis: Secondary | ICD-10-CM | POA: Diagnosis not present

## 2023-12-15 DIAGNOSIS — E440 Moderate protein-calorie malnutrition: Secondary | ICD-10-CM | POA: Insufficient documentation

## 2023-12-15 DIAGNOSIS — N182 Chronic kidney disease, stage 2 (mild): Secondary | ICD-10-CM | POA: Diagnosis present

## 2023-12-15 DIAGNOSIS — L02611 Cutaneous abscess of right foot: Secondary | ICD-10-CM

## 2023-12-15 DIAGNOSIS — Z6823 Body mass index (BMI) 23.0-23.9, adult: Secondary | ICD-10-CM

## 2023-12-15 DIAGNOSIS — Z7902 Long term (current) use of antithrombotics/antiplatelets: Secondary | ICD-10-CM

## 2023-12-15 DIAGNOSIS — M86171 Other acute osteomyelitis, right ankle and foot: Secondary | ICD-10-CM

## 2023-12-15 DIAGNOSIS — Z820 Family history of epilepsy and other diseases of the nervous system: Secondary | ICD-10-CM

## 2023-12-15 DIAGNOSIS — E11621 Type 2 diabetes mellitus with foot ulcer: Secondary | ICD-10-CM | POA: Diagnosis present

## 2023-12-15 DIAGNOSIS — L97819 Non-pressure chronic ulcer of other part of right lower leg with unspecified severity: Secondary | ICD-10-CM | POA: Diagnosis present

## 2023-12-15 DIAGNOSIS — Z885 Allergy status to narcotic agent status: Secondary | ICD-10-CM

## 2023-12-15 DIAGNOSIS — M1990 Unspecified osteoarthritis, unspecified site: Secondary | ICD-10-CM | POA: Diagnosis present

## 2023-12-15 DIAGNOSIS — N179 Acute kidney failure, unspecified: Secondary | ICD-10-CM | POA: Diagnosis present

## 2023-12-15 DIAGNOSIS — Z7984 Long term (current) use of oral hypoglycemic drugs: Secondary | ICD-10-CM

## 2023-12-15 DIAGNOSIS — E1142 Type 2 diabetes mellitus with diabetic polyneuropathy: Secondary | ICD-10-CM | POA: Diagnosis present

## 2023-12-15 DIAGNOSIS — I70261 Atherosclerosis of native arteries of extremities with gangrene, right leg: Secondary | ICD-10-CM | POA: Diagnosis present

## 2023-12-15 DIAGNOSIS — E1165 Type 2 diabetes mellitus with hyperglycemia: Secondary | ICD-10-CM | POA: Diagnosis present

## 2023-12-15 DIAGNOSIS — Z794 Long term (current) use of insulin: Secondary | ICD-10-CM

## 2023-12-15 DIAGNOSIS — Z961 Presence of intraocular lens: Secondary | ICD-10-CM | POA: Diagnosis present

## 2023-12-15 DIAGNOSIS — Z89512 Acquired absence of left leg below knee: Secondary | ICD-10-CM

## 2023-12-15 DIAGNOSIS — Z9842 Cataract extraction status, left eye: Secondary | ICD-10-CM

## 2023-12-15 DIAGNOSIS — E1169 Type 2 diabetes mellitus with other specified complication: Principal | ICD-10-CM | POA: Diagnosis present

## 2023-12-15 DIAGNOSIS — E11628 Type 2 diabetes mellitus with other skin complications: Secondary | ICD-10-CM | POA: Diagnosis not present

## 2023-12-15 DIAGNOSIS — L089 Local infection of the skin and subcutaneous tissue, unspecified: Principal | ICD-10-CM | POA: Diagnosis present

## 2023-12-15 DIAGNOSIS — Z8673 Personal history of transient ischemic attack (TIA), and cerebral infarction without residual deficits: Secondary | ICD-10-CM

## 2023-12-15 LAB — CBC WITH DIFFERENTIAL/PLATELET
Abs Immature Granulocytes: 0.19 10*3/uL — ABNORMAL HIGH (ref 0.00–0.07)
Basophils Absolute: 0.1 10*3/uL (ref 0.0–0.1)
Basophils Relative: 1 %
Eosinophils Absolute: 0 10*3/uL (ref 0.0–0.5)
Eosinophils Relative: 0 %
HCT: 36.6 % — ABNORMAL LOW (ref 39.0–52.0)
Hemoglobin: 12.2 g/dL — ABNORMAL LOW (ref 13.0–17.0)
Immature Granulocytes: 1 %
Lymphocytes Relative: 5 %
Lymphs Abs: 0.9 10*3/uL (ref 0.7–4.0)
MCH: 30.4 pg (ref 26.0–34.0)
MCHC: 33.3 g/dL (ref 30.0–36.0)
MCV: 91.3 fL (ref 80.0–100.0)
Monocytes Absolute: 1.1 10*3/uL — ABNORMAL HIGH (ref 0.1–1.0)
Monocytes Relative: 7 %
Neutro Abs: 14.6 10*3/uL — ABNORMAL HIGH (ref 1.7–7.7)
Neutrophils Relative %: 86 %
Platelets: 453 10*3/uL — ABNORMAL HIGH (ref 150–400)
RBC: 4.01 MIL/uL — ABNORMAL LOW (ref 4.22–5.81)
RDW: 12.1 % (ref 11.5–15.5)
WBC: 16.9 10*3/uL — ABNORMAL HIGH (ref 4.0–10.5)
nRBC: 0 % (ref 0.0–0.2)

## 2023-12-15 LAB — I-STAT CG4 LACTIC ACID, ED: Lactic Acid, Venous: 1.1 mmol/L (ref 0.5–1.9)

## 2023-12-15 NOTE — ED Triage Notes (Signed)
 The pt is here tonight  with a rt swollen ankle  he was seen at [facility] one wek ago and the bandage was placed there the wife that he  lives did not know that he had a bandage on that foot and it appears too tight  c/o pain

## 2023-12-15 NOTE — ED Triage Notes (Signed)
 The pt has also had a temp for one week

## 2023-12-16 ENCOUNTER — Emergency Department (HOSPITAL_COMMUNITY)

## 2023-12-16 ENCOUNTER — Encounter (HOSPITAL_COMMUNITY): Payer: Self-pay | Admitting: Family Medicine

## 2023-12-16 ENCOUNTER — Encounter (HOSPITAL_COMMUNITY)

## 2023-12-16 DIAGNOSIS — N182 Chronic kidney disease, stage 2 (mild): Secondary | ICD-10-CM | POA: Diagnosis not present

## 2023-12-16 DIAGNOSIS — Z9842 Cataract extraction status, left eye: Secondary | ICD-10-CM | POA: Diagnosis not present

## 2023-12-16 DIAGNOSIS — M869 Osteomyelitis, unspecified: Secondary | ICD-10-CM | POA: Diagnosis not present

## 2023-12-16 DIAGNOSIS — Z8673 Personal history of transient ischemic attack (TIA), and cerebral infarction without residual deficits: Secondary | ICD-10-CM

## 2023-12-16 DIAGNOSIS — Z7902 Long term (current) use of antithrombotics/antiplatelets: Secondary | ICD-10-CM | POA: Diagnosis not present

## 2023-12-16 DIAGNOSIS — Z794 Long term (current) use of insulin: Secondary | ICD-10-CM | POA: Diagnosis not present

## 2023-12-16 DIAGNOSIS — R739 Hyperglycemia, unspecified: Secondary | ICD-10-CM | POA: Diagnosis not present

## 2023-12-16 DIAGNOSIS — E11628 Type 2 diabetes mellitus with other skin complications: Secondary | ICD-10-CM | POA: Diagnosis present

## 2023-12-16 DIAGNOSIS — I70261 Atherosclerosis of native arteries of extremities with gangrene, right leg: Secondary | ICD-10-CM | POA: Diagnosis present

## 2023-12-16 DIAGNOSIS — E440 Moderate protein-calorie malnutrition: Secondary | ICD-10-CM | POA: Diagnosis present

## 2023-12-16 DIAGNOSIS — K5900 Constipation, unspecified: Secondary | ICD-10-CM | POA: Diagnosis present

## 2023-12-16 DIAGNOSIS — Z6823 Body mass index (BMI) 23.0-23.9, adult: Secondary | ICD-10-CM | POA: Diagnosis not present

## 2023-12-16 DIAGNOSIS — Z9841 Cataract extraction status, right eye: Secondary | ICD-10-CM | POA: Diagnosis not present

## 2023-12-16 DIAGNOSIS — E1169 Type 2 diabetes mellitus with other specified complication: Secondary | ICD-10-CM | POA: Diagnosis present

## 2023-12-16 DIAGNOSIS — Z885 Allergy status to narcotic agent status: Secondary | ICD-10-CM | POA: Diagnosis not present

## 2023-12-16 DIAGNOSIS — E1165 Type 2 diabetes mellitus with hyperglycemia: Secondary | ICD-10-CM | POA: Diagnosis present

## 2023-12-16 DIAGNOSIS — Z89511 Acquired absence of right leg below knee: Secondary | ICD-10-CM | POA: Diagnosis not present

## 2023-12-16 DIAGNOSIS — N179 Acute kidney failure, unspecified: Secondary | ICD-10-CM | POA: Diagnosis present

## 2023-12-16 DIAGNOSIS — L97509 Non-pressure chronic ulcer of other part of unspecified foot with unspecified severity: Secondary | ICD-10-CM | POA: Diagnosis not present

## 2023-12-16 DIAGNOSIS — L089 Local infection of the skin and subcutaneous tissue, unspecified: Secondary | ICD-10-CM | POA: Diagnosis present

## 2023-12-16 DIAGNOSIS — L02611 Cutaneous abscess of right foot: Secondary | ICD-10-CM

## 2023-12-16 DIAGNOSIS — D62 Acute posthemorrhagic anemia: Secondary | ICD-10-CM | POA: Diagnosis not present

## 2023-12-16 DIAGNOSIS — Z7984 Long term (current) use of oral hypoglycemic drugs: Secondary | ICD-10-CM | POA: Diagnosis not present

## 2023-12-16 DIAGNOSIS — G4700 Insomnia, unspecified: Secondary | ICD-10-CM | POA: Diagnosis not present

## 2023-12-16 DIAGNOSIS — M86171 Other acute osteomyelitis, right ankle and foot: Secondary | ICD-10-CM

## 2023-12-16 DIAGNOSIS — E11621 Type 2 diabetes mellitus with foot ulcer: Secondary | ICD-10-CM | POA: Diagnosis present

## 2023-12-16 DIAGNOSIS — E1122 Type 2 diabetes mellitus with diabetic chronic kidney disease: Secondary | ICD-10-CM | POA: Diagnosis present

## 2023-12-16 DIAGNOSIS — J9811 Atelectasis: Secondary | ICD-10-CM | POA: Diagnosis not present

## 2023-12-16 DIAGNOSIS — Z4781 Encounter for orthopedic aftercare following surgical amputation: Secondary | ICD-10-CM | POA: Diagnosis not present

## 2023-12-16 DIAGNOSIS — Z961 Presence of intraocular lens: Secondary | ICD-10-CM | POA: Diagnosis present

## 2023-12-16 DIAGNOSIS — K5901 Slow transit constipation: Secondary | ICD-10-CM | POA: Diagnosis not present

## 2023-12-16 DIAGNOSIS — F54 Psychological and behavioral factors associated with disorders or diseases classified elsewhere: Secondary | ICD-10-CM | POA: Diagnosis not present

## 2023-12-16 DIAGNOSIS — L97819 Non-pressure chronic ulcer of other part of right lower leg with unspecified severity: Secondary | ICD-10-CM | POA: Diagnosis present

## 2023-12-16 DIAGNOSIS — E1142 Type 2 diabetes mellitus with diabetic polyneuropathy: Secondary | ICD-10-CM

## 2023-12-16 DIAGNOSIS — Z89512 Acquired absence of left leg below knee: Secondary | ICD-10-CM | POA: Diagnosis not present

## 2023-12-16 LAB — BASIC METABOLIC PANEL WITH GFR
Anion gap: 18 — ABNORMAL HIGH (ref 5–15)
BUN: 18 mg/dL (ref 8–23)
CO2: 16 mmol/L — ABNORMAL LOW (ref 22–32)
Calcium: 8 mg/dL — ABNORMAL LOW (ref 8.9–10.3)
Chloride: 96 mmol/L — ABNORMAL LOW (ref 98–111)
Creatinine, Ser: 1.3 mg/dL — ABNORMAL HIGH (ref 0.61–1.24)
GFR, Estimated: 56 mL/min — ABNORMAL LOW (ref >60)
Glucose, Bld: 251 mg/dL — ABNORMAL HIGH (ref 70–99)
Potassium: 4.1 mmol/L (ref 3.5–5.1)
Sodium: 130 mmol/L — ABNORMAL LOW (ref 135–145)

## 2023-12-16 LAB — C-REACTIVE PROTEIN: CRP: 27.8 mg/dL — ABNORMAL HIGH (ref <1.0)

## 2023-12-16 LAB — CBG MONITORING, ED
Glucose-Capillary: 258 mg/dL — ABNORMAL HIGH (ref 70–99)
Glucose-Capillary: 211 mg/dL — ABNORMAL HIGH (ref 70–99)
Glucose-Capillary: 190 mg/dL — ABNORMAL HIGH (ref 70–99)

## 2023-12-16 LAB — COMPREHENSIVE METABOLIC PANEL WITH GFR
ALT: 26 U/L (ref 0–44)
AST: 40 U/L (ref 15–41)
Albumin: 2.8 g/dL — ABNORMAL LOW (ref 3.5–5.0)
Alkaline Phosphatase: 117 U/L (ref 38–126)
Anion gap: 19 — ABNORMAL HIGH (ref 5–15)
BUN: 18 mg/dL (ref 8–23)
CO2: 19 mmol/L — ABNORMAL LOW (ref 22–32)
Calcium: 9.2 mg/dL (ref 8.9–10.3)
Chloride: 94 mmol/L — ABNORMAL LOW (ref 98–111)
Creatinine, Ser: 1.45 mg/dL — ABNORMAL HIGH (ref 0.61–1.24)
GFR, Estimated: 49 mL/min — ABNORMAL LOW (ref >60)
Glucose, Bld: 191 mg/dL — ABNORMAL HIGH (ref 70–99)
Potassium: 4.6 mmol/L (ref 3.5–5.1)
Sodium: 132 mmol/L — ABNORMAL LOW (ref 135–145)
Total Bilirubin: 1.3 mg/dL — ABNORMAL HIGH (ref 0.0–1.2)
Total Protein: 7.1 g/dL (ref 6.5–8.1)

## 2023-12-16 LAB — CBC
HCT: 31.7 % — ABNORMAL LOW (ref 39.0–52.0)
Hemoglobin: 10.4 g/dL — ABNORMAL LOW (ref 13.0–17.0)
MCH: 30.4 pg (ref 26.0–34.0)
MCHC: 32.8 g/dL (ref 30.0–36.0)
MCV: 92.7 fL (ref 80.0–100.0)
Platelets: 393 10*3/uL (ref 150–400)
RBC: 3.42 MIL/uL — ABNORMAL LOW (ref 4.22–5.81)
RDW: 12.1 % (ref 11.5–15.5)
WBC: 15.9 10*3/uL — ABNORMAL HIGH (ref 4.0–10.5)
nRBC: 0 % (ref 0.0–0.2)

## 2023-12-16 LAB — PREALBUMIN: Prealbumin: <5 mg/dL — ABNORMAL LOW (ref 18–38)

## 2023-12-16 LAB — GLUCOSE, CAPILLARY
Glucose-Capillary: 228 mg/dL — ABNORMAL HIGH (ref 70–99)
Glucose-Capillary: 252 mg/dL — ABNORMAL HIGH (ref 70–99)

## 2023-12-16 LAB — PROTIME-INR
INR: 1.2 (ref 0.8–1.2)
Prothrombin Time: 15.6 s — ABNORMAL HIGH (ref 11.4–15.2)

## 2023-12-16 LAB — SEDIMENTATION RATE: Sed Rate: 105 mm/h — ABNORMAL HIGH (ref 0–16)

## 2023-12-16 MED ORDER — TRAMADOL HCL 50 MG PO TABS
50.0000 mg | ORAL_TABLET | Freq: Three times a day (TID) | ORAL | Status: DC | PRN
  Administered 2023-12-16 – 2023-12-19 (×8): 50 mg via ORAL
  Filled 2023-12-16 (×8): qty 1

## 2023-12-16 MED ORDER — SENNOSIDES-DOCUSATE SODIUM 8.6-50 MG PO TABS
1.0000 | ORAL_TABLET | Freq: Every evening | ORAL | Status: DC | PRN
  Administered 2023-12-19 – 2023-12-23 (×3): 1 via ORAL
  Filled 2023-12-16 (×3): qty 1

## 2023-12-16 MED ORDER — HYDROCODONE-ACETAMINOPHEN 5-325 MG PO TABS: 1.0000 | ORAL_TABLET | Freq: Four times a day (QID) | ORAL | Status: DC | PRN

## 2023-12-16 MED ORDER — HYDROMORPHONE HCL 1 MG/ML IJ SOLN
0.5000 mg | INTRAMUSCULAR | Status: DC | PRN
  Administered 2023-12-17 – 2023-12-20 (×9): 0.5 mg via INTRAVENOUS
  Filled 2023-12-16 (×9): qty 0.5

## 2023-12-16 MED ORDER — SODIUM CHLORIDE 0.9% FLUSH
3.0000 mL | Freq: Two times a day (BID) | INTRAVENOUS | Status: DC
  Administered 2023-12-16 – 2023-12-25 (×17): 3 mL via INTRAVENOUS

## 2023-12-16 MED ORDER — FENTANYL CITRATE PF 50 MCG/ML IJ SOSY: 12.5000 ug | PREFILLED_SYRINGE | INTRAMUSCULAR | Status: DC | PRN

## 2023-12-16 MED ORDER — SODIUM CHLORIDE 0.9 % IV SOLN
3.0000 g | Freq: Once | INTRAVENOUS | Status: AC
  Administered 2023-12-16: 3 g via INTRAVENOUS
  Filled 2023-12-16: qty 8

## 2023-12-16 MED ORDER — SODIUM CHLORIDE 0.9 % IV SOLN
3.0000 g | Freq: Four times a day (QID) | INTRAVENOUS | Status: AC
  Administered 2023-12-16 – 2023-12-21 (×22): 3 g via INTRAVENOUS
  Filled 2023-12-16 (×23): qty 8

## 2023-12-16 MED ORDER — ACETAMINOPHEN 650 MG RE SUPP: 650.0000 mg | Freq: Four times a day (QID) | RECTAL | Status: DC | PRN

## 2023-12-16 MED ORDER — ACETAMINOPHEN 325 MG PO TABS
650.0000 mg | ORAL_TABLET | Freq: Four times a day (QID) | ORAL | Status: DC | PRN
  Administered 2023-12-16 – 2023-12-18 (×2): 650 mg via ORAL
  Filled 2023-12-16 (×2): qty 2

## 2023-12-16 MED ORDER — LINEZOLID 600 MG PO TABS
600.0000 mg | ORAL_TABLET | Freq: Two times a day (BID) | ORAL | Status: AC
  Administered 2023-12-16 – 2023-12-21 (×10): 600 mg via ORAL
  Filled 2023-12-16 (×11): qty 1

## 2023-12-16 MED ORDER — SODIUM CHLORIDE 0.9 % IV SOLN: INTRAVENOUS | Status: AC

## 2023-12-16 MED ORDER — PROCHLORPERAZINE EDISYLATE 10 MG/2ML IJ SOLN: 5.0000 mg | Freq: Four times a day (QID) | INTRAMUSCULAR | Status: DC | PRN

## 2023-12-16 MED ORDER — MELATONIN 3 MG PO TABS
3.0000 mg | ORAL_TABLET | Freq: Every day | ORAL | Status: DC
  Administered 2023-12-16 – 2023-12-24 (×8): 3 mg via ORAL
  Filled 2023-12-16 (×9): qty 1

## 2023-12-16 MED ORDER — INSULIN ASPART 100 UNIT/ML IJ SOLN
0.0000 [IU] | INTRAMUSCULAR | Status: DC
  Administered 2023-12-16: 3 [IU] via SUBCUTANEOUS
  Administered 2023-12-16: 5 [IU] via SUBCUTANEOUS
  Administered 2023-12-16: 3 [IU] via SUBCUTANEOUS
  Administered 2023-12-16: 5 [IU] via SUBCUTANEOUS
  Administered 2023-12-16 – 2023-12-17 (×3): 2 [IU] via SUBCUTANEOUS
  Administered 2023-12-17: 3 [IU] via SUBCUTANEOUS
  Administered 2023-12-17 (×2): 2 [IU] via SUBCUTANEOUS
  Administered 2023-12-17: 3 [IU] via SUBCUTANEOUS
  Administered 2023-12-18: 2 [IU] via SUBCUTANEOUS
  Administered 2023-12-18: 1 [IU] via SUBCUTANEOUS
  Administered 2023-12-18 (×4): 2 [IU] via SUBCUTANEOUS
  Administered 2023-12-19: 3 [IU] via SUBCUTANEOUS
  Administered 2023-12-19 (×2): 2 [IU] via SUBCUTANEOUS
  Administered 2023-12-19: 1 [IU] via SUBCUTANEOUS

## 2023-12-16 MED ORDER — INSULIN GLARGINE-YFGN 100 UNIT/ML ~~LOC~~ SOLN
10.0000 [IU] | Freq: Every day | SUBCUTANEOUS | Status: DC
  Administered 2023-12-16 – 2023-12-18 (×3): 10 [IU] via SUBCUTANEOUS
  Filled 2023-12-16 (×4): qty 0.1

## 2023-12-16 MED ORDER — LINEZOLID 600 MG/300ML IV SOLN
600.0000 mg | Freq: Once | INTRAVENOUS | Status: AC
  Administered 2023-12-16: 600 mg via INTRAVENOUS
  Filled 2023-12-16: qty 300

## 2023-12-16 NOTE — H&P (Signed)
 History and Physical    Kevin Bradford JXB:[id number] DOB: 1946/07/03 DOA: 12/15/2023  PCP: Jearldine Mina, MD   Patient coming from: Home   Chief Complaint: Fever, right heel wound   HPI: Kevin Bradford is a 78 y.o. male with medical history significant for poorly controlled diabetes mellitus, history of CVA, CKD stage II, and history of osteomyelitis status post left BKA who presents with fever and right heel wound.  According the patient's wife, the patient had a temperature of 71 F a couple days ago but they were unsure of the source.  Yesterday, she noted that the patient had a bandage on his right heel.  There was foul odor from the underlying wound and purulent drainage.  Patient denies any significant pain involving the foot, denies chest pain, and denies cough or shortness of breath.  ED Course: Upon arrival to the ED, patient is found to be afebrile and saturating well on room air with transient tachycardia and an isolated low BP reading.  Labs are most notable for creatinine 1.45, albumin 2.8, WBC 16,900, and lactic acid 1.1.  There was no definite osteomyelitis on plain radiographs of the right foot.  Blood cultures were collected, MRI was obtained but not yet read, and the patient was treated with Unasyn and linezolid.  Review of Systems:  All other systems reviewed and apart from HPI, are negative.  Past Medical History:  Diagnosis Date   Arthritis    "joint pain" (08/16/2017)   Below knee amputation (HCC)    Cellulitis and abscess of leg 10/18/2014   CKD (chronic kidney disease) stage 3, GFR 30-59 ml/min (HCC)    creatinine increases with antibiotics per pt, no known kidney disease per patient   Dehiscence of closure of skin, subsequent encounter    Diabetic foot ulcer (HCC)    left   Diabetic peripheral neuropathy (HCC)    GERD (gastroesophageal reflux disease)    12/26/2017- "not this year."    Osteomyelitis (HCC)    left transmetatarsal    Stroke (HCC)    Type II  diabetes mellitus (HCC)     Past Surgical History:  Procedure Laterality Date   AMPUTATION Left 07/19/2017   Procedure: LEFT TRANSMETATARSAL AMPUTATION;  Surgeon: Any Mcneice Ford, MD;  Location: [facility] OR;  Service: Orthopedics;  Laterality: Left;   AMPUTATION Left 08/16/2017   REVISION LEFT TRANSMETATARSAL AMPUTATION   AMPUTATION Left 11/22/2017   Procedure: LEFT BELOW KNEE AMPUTATION;  Surgeon: Abram Sax Ford, MD;  Location: [facility] OR;  Service: Orthopedics;  Laterality: Left;   CATARACT EXTRACTION W/ INTRAOCULAR LENS  IMPLANT, BILATERAL  ~ 2016   EYE SURGERY Bilateral    catarct   I & D EXTREMITY Left 10/17/2014   Procedure: IRRIGATION AND DEBRIDEMENT EXTREMITY LEFT FOOT;  Surgeon: Xyla Leisner Ford, MD;  Location: [facility] OR;  Service: Orthopedics;  Laterality: Left;   IR ANGIO INTRA EXTRACRAN SEL COM CAROTID INNOMINATE BILAT MOD SED  05/07/2018   IR ANGIO VERTEBRAL SEL SUBCLAVIAN INNOMINATE BILAT MOD SED  05/07/2018   STUMP REVISION Left 08/16/2017   Procedure: REVISION LEFT TRANSMETATARSAL AMPUTATION;  Surgeon: Revan Gendron Ford, MD;  Location: [facility] OR;  Service: Orthopedics;  Laterality: Left;   STUMP REVISION Left 12/27/2017   Procedure: LEFT BELOW KNEE AMPUTATION REVISION;  Surgeon: Rifky Lapre Ford, MD;  Location: [facility] OR;  Service: Orthopedics;  Laterality: Left;   TOE AMPUTATION Right 2013   5th toe    Social History:   reports that he has never smoked.  He has never used smokeless tobacco. He reports current alcohol use of about 1.0 standard drink of alcohol per week. He reports that he does not use drugs.  Allergies  Allergen Reactions   Oxycodone  Shortness Of Breath    Family History  Problem Relation Age of Onset   Dementia Mother      Prior to Admission medications   Medication Sig Start Date End Date Taking? Authorizing Provider  ascorbic acid (VITAMIN C) 500 MG tablet Take 500-1,000 mg by mouth daily.    [provider]  Cholecalciferol (VITAMIN D3 PO) Take 1 capsule by mouth  daily.    [provider]  clopidogrel  (PLAVIX ) 75 MG tablet Take 1 tablet (75 mg total) by mouth daily. 12/09/23   Magdalene School, MD  insulin  aspart (NOVOLOG  FLEXPEN) 100 UNIT/ML FlexPen Inject 4 Units into the skin 3 (three) times daily with meals. 12/09/23 01/08/24  Magdalene School, MD  insulin  detemir (LEVEMIR  FLEXTOUCH) 100 UNIT/ML FlexPen Inject 25 Units into the skin daily at 10 pm. 12/09/23 01/08/24  Magdalene School, MD  MAGNESIUM  PO Take 1 tablet by mouth daily.    [provider]  MELATONIN GUMMIES PO Take 1-2 tablets by mouth at bedtime as needed (for sleep).    [provider]  metFORMIN  (GLUCOPHAGE ) 500 MG tablet Take 1 tablet (500 mg total) by mouth 2 (two) times daily with a meal. Patient taking differently: Take 1,000 mg by mouth 2 (two) times daily with a meal. 11/30/17   Angiulli, Everlyn Hockey, PA-C  TUMS E-X 750 750 MG chewable tablet Chew 1 tablet by mouth 2 (two) times daily as needed for heartburn.    [provider]  VITAMIN A PO Take 1 capsule by mouth daily.    [provider]    Physical Exam: Vitals:   12/16/23 0145 12/16/23 0200 12/16/23 0315 12/16/23 0415  BP: (!) 127/99 138/76 (!) 140/117 115/79  Pulse: 93 95 100 97  Resp: 16 18 (!) 21 (!) 24  Temp:      SpO2: 100% 100% 100% 100%  Weight:      Height:        Constitutional: NAD, no pallor or diaphoresis   Eyes: PERTLA, lids and conjunctivae normal ENMT: Mucous membranes are moist. Posterior pharynx clear of any exudate or lesions.   Neck: supple, no masses  Respiratory: no wheezing, no crackles. No accessory muscle use.  Cardiovascular: S1 & S2 heard, regular rate and rhythm. No JVD. Abdomen: No distension, no tenderness, soft. Bowel sounds active.  Musculoskeletal: no clubbing / cyanosis. S/p left BKA.   Skin: Eschar overlies right heel with foul odor, scant purulent drainage, and surrounding erythema and heat. Skin is otherwise warm, dry, well-perfused. Neurologic:  CN 2-12 grossly intact. Moving all extremities. Alert and oriented.  Psychiatric: Calm. Cooperative.    Labs and Imaging on Admission: I have personally reviewed following labs and imaging studies  CBC: Recent Labs  Lab 12/15/23 2343  WBC 16.9*  NEUTROABS 14.6*  HGB 12.2*  HCT 36.6*  MCV 91.3  PLT 453*   Basic Metabolic Panel: Recent Labs  Lab 12/15/23 2343  NA 132*  K 4.6  CL 94*  CO2 19*  GLUCOSE 191*  BUN 18  CREATININE 1.45*  CALCIUM  9.2   GFR: Estimated Creatinine Clearance: 48.8 mL/min (A) (by C-G formula based on SCr of 1.45 mg/dL (H)). Liver Function Tests: Recent Labs  Lab 12/15/23 2343  AST 40  ALT 26  ALKPHOS 117  BILITOT  1.3*  PROT 7.1  ALBUMIN 2.8*   No results for input(s): "LIPASE", "AMYLASE" in the last 168 hours. No results for input(s): "AMMONIA" in the last 168 hours. Coagulation Profile: Recent Labs  Lab 12/15/23 2343  INR 1.2   Cardiac Enzymes: No results for input(s): "CKTOTAL", "CKMB", "CKMBINDEX", "TROPONINI" in the last 168 hours. BNP (last 3 results) No results for input(s): "PROBNP" in the last 8760 hours. HbA1C: No results for input(s): "HGBA1C" in the last 72 hours. CBG: Recent Labs  Lab 12/09/23 0607  GLUCAP 202*   Lipid Profile: No results for input(s): "CHOL", "HDL", "LDLCALC", "TRIG", "CHOLHDL", "LDLDIRECT" in the last 72 hours. Thyroid  Function Tests: No results for input(s): "TSH", "T4TOTAL", "FREET4", "T3FREE", "THYROIDAB" in the last 72 hours. Anemia Panel: No results for input(s): "VITAMINB12", "FOLATE", "FERRITIN", "TIBC", "IRON", "RETICCTPCT" in the last 72 hours. Urine analysis:    Component Value Date/Time   COLORURINE YELLOW 12/07/2023 2238   APPEARANCEUR CLEAR 12/07/2023 2238   LABSPEC 1.027 12/07/2023 2238   PHURINE 5.5 12/07/2023 2238   GLUCOSEU >1,000 (A) 12/07/2023 2238   HGBUR NEGATIVE 12/07/2023 2238   BILIRUBINUR NEGATIVE 12/07/2023 2238   KETONESUR >80 (A) 12/07/2023 2238   PROTEINUR 30  (A) 12/07/2023 2238   NITRITE NEGATIVE 12/07/2023 2238   LEUKOCYTESUR NEGATIVE 12/07/2023 2238   Sepsis Labs: @LABRCNTIP (procalcitonin:4,lacticidven:4) ) Recent Results (from the past 240 hours)  Culture, blood (routine x 2)     Status: None   Collection Time: 12/07/23 10:35 PM   Specimen: BLOOD  Result Value Ref Range Status   Specimen Description   Final    BLOOD RIGHT ANTECUBITAL Performed at [facility], [address] [place], [place] [address]    Special Requests   Final    BOTTLES DRAWN AEROBIC AND ANAEROBIC Blood Culture adequate volume Performed at [facility], [address] [place], [place] [address]    Culture   Final    NO GROWTH 5 DAYS Performed at [facility], [address], [place], [place] [address]    Report Status 12/13/2023 FINAL  Final  Resp panel by RT-PCR (RSV, Flu A&B, Covid) Peripheral     Status: None   Collection Time: 12/07/23 10:40 PM   Specimen: Peripheral; Nasal Swab  Result Value Ref Range Status   SARS Coronavirus 2 by RT PCR NEGATIVE NEGATIVE Final    Comment: (NOTE) SARS-CoV-2 target nucleic acids are NOT DETECTED.  The SARS-CoV-2 RNA is generally detectable in upper respiratory specimens during the acute phase of infection. The lowest concentration of SARS-CoV-2 viral copies this assay can detect is 138 copies/mL. A negative result does not preclude SARS-Cov-2 infection and should not be used as the sole basis for treatment or other patient management decisions. A negative result may occur with  improper specimen collection/handling, submission of specimen other than nasopharyngeal swab, presence of viral mutation(s) within the areas targeted by this assay, and inadequate number of viral copies(<138 copies/mL). A negative result must be combined with clinical observations, patient history, and epidemiological information. The expected result is Negative.  Fact Sheet for Patients:   BloggerCourse.com  Fact Sheet for Healthcare Providers:  SeriousBroker.it  This test is no t yet approved or cleared by the [place]  FDA and  has been authorized for detection and/or diagnosis of SARS-CoV-2 by FDA under an Emergency Use Authorization (EUA). This EUA will remain  in effect (meaning this test can be used) for the duration of the COVID-19 declaration under Section 564(b)(1) of  the Act, 21 U.S.C.section 360bbb-3(b)(1), unless the authorization is terminated  or revoked sooner.       Influenza A by PCR NEGATIVE NEGATIVE Final   Influenza B by PCR NEGATIVE NEGATIVE Final    Comment: (NOTE) The Xpert Xpress SARS-CoV-2/FLU/RSV plus assay is intended as an aid in the diagnosis of influenza from Nasopharyngeal swab specimens and should not be used as a sole basis for treatment. Nasal washings and aspirates are unacceptable for Xpert Xpress SARS-CoV-2/FLU/RSV testing.  Fact Sheet for Patients: BloggerCourse.com  Fact Sheet for Healthcare Providers: SeriousBroker.it  This test is not yet approved or cleared by the [place]  FDA and has been authorized for detection and/or diagnosis of SARS-CoV-2 by FDA under an Emergency Use Authorization (EUA). This EUA will remain in effect (meaning this test can be used) for the duration of the COVID-19 declaration under Section 564(b)(1) of the Act, 21 U.S.C. section 360bbb-3(b)(1), unless the authorization is terminated or revoked.     Resp Syncytial Virus by PCR NEGATIVE NEGATIVE Final    Comment: (NOTE) Fact Sheet for Patients: BloggerCourse.com  Fact Sheet for Healthcare Providers: SeriousBroker.it  This test is not yet approved or cleared by the [place]  FDA and has been authorized for detection and/or diagnosis of SARS-CoV-2 by FDA under an Emergency Use  Authorization (EUA). This EUA will remain in effect (meaning this test can be used) for the duration of the COVID-19 declaration under Section 564(b)(1) of the Act, 21 U.S.C. section 360bbb-3(b)(1), unless the authorization is terminated or revoked.  Performed at [facility], [address] [place], [place] [address]   MRSA Next Gen by PCR, Nasal     Status: None   Collection Time: 12/08/23 11:33 AM   Specimen: Nasal Mucosa; Nasal Swab  Result Value Ref Range Status   MRSA by PCR Next Gen NOT DETECTED NOT DETECTED Final    Comment: (NOTE) The GeneXpert MRSA Assay (FDA approved for NASAL specimens only), is one component of a comprehensive MRSA colonization surveillance program. It is not intended to diagnose MRSA infection nor to guide or monitor treatment for MRSA infections. Test performance is not FDA approved in patients less than 75 years old. Performed at [facility], [address], [place], [place] [address]   Culture, blood (routine x 2)     Status: None   Collection Time: 12/08/23 12:43 PM   Specimen: BLOOD LEFT ARM  Result Value Ref Range Status   Specimen Description BLOOD LEFT ARM  Final   Special Requests   Final    BOTTLES DRAWN AEROBIC AND ANAEROBIC Blood Culture adequate volume   Culture   Final    NO GROWTH 5 DAYS Performed at [facility], [address], [place], [place] [address]    Report Status 12/13/2023 FINAL  Final  Culture, blood (Routine x 2)     Status: None (Preliminary result)   Collection Time: 12/15/23 11:43 PM   Specimen: BLOOD  Result Value Ref Range Status   Specimen Description BLOOD BLOOD RIGHT HAND  Final   Special Requests   Final    BOTTLES DRAWN AEROBIC ONLY Blood Culture adequate volume Performed at [facility], [address], [place], [place] [address]    Culture PENDING  Incomplete   Report Status PENDING  Incomplete     Radiological Exams on Admission: DG Chest Port 1 View Result  Date: 12/16/2023 EXAM: 1 VIEW XRAY OF THE CHEST 12/16/2023 12:55:00 AM COMPARISON: 12/07/2023 CLINICAL  HISTORY: Infection. FINDINGS: LUNGS AND PLEURA: No focal pulmonary opacity. No pulmonary edema. No pleural effusion. No pneumothorax. HEART AND MEDIASTINUM: No acute abnormality of the cardiac and mediastinal silhouettes. BONES AND SOFT TISSUES: No acute osseous abnormality. IMPRESSION: 1. No acute process. Electronically signed by: Zadie Herter MD 12/16/2023 01:04 AM EDT RP Workstation: [id number]   DG Foot Complete Right Result Date: 12/16/2023 EXAM: 3 or more VIEW(S) XRAY OF THE RIGHT FOOT 12/16/2023 12:55:00 AM COMPARISON: None available. CLINICAL HISTORY: Infection. FINDINGS: BONES AND JOINTS: Mild degenerative changes of the first MTP (metatarsophalangeal) joint. Status post fifth digit amputation at the level of the mid metatarsal shaft. Small posterior and plantar calcaneal enthesophytes. SOFT TISSUES: Mild soft tissue gas overlying the calcaneus, without definite cortical destruction. IMPRESSION: 1. Mild soft tissue gas overlying the calcaneus, without definite cortical destruction to suggest osteomyelitis. 2. Status post fifth digit amputation at the level of the mid metatarsal shaft. Electronically signed by: Zadie Herter MD 12/16/2023 01:03 AM EDT RP Workstation: [id number]    Assessment/Plan   1. Diabetic right foot infection  - Continue empiric antibiotics, follow-up on MRI findings, check ABI, follow cultures and clinical course    2. Insulin -dependent DM  - A1c was 13.7% in May 2025  - Check CBGs and continue long- and short-acting insulin     3. CKD stage II  - Creatinine appears to be slightly up from baseline  - Hydrate with IVF while NPO, renally-dose medications, monitor   4. Hx of CVA  - Hold Plavix  pending MRI results     DVT prophylaxis: SCD Code Status: Full  Level of Care: Level of care: Progressive Family Communication: wife at bedside  Disposition Plan:   Patient is from: home  Anticipated d/c is to: TBD Anticipated d/c date is: Depends on MRI findings and response to treatment  Patient currently: Pending MRI foot, response to treatment  Consults called: None  Admission status: Inpatient     Walton Guppy, MD [facility]  12/16/2023, 4:25 AM

## 2023-12-16 NOTE — ED Provider Notes (Signed)
 [place] [facility] AT [facility] Provider Note   CSN: [id number] Arrival date & time: 12/15/23  2258     History  Chief Complaint  Patient presents with   rt foot swollen    Ramere Downs is a 78 y.o. male.  Patient presents to the emergency department for evaluation of right foot infection.  Patient with history of diabetes, was hospitalized earlier this week for diabetic ketoacidosis.  Wife reports that she started to smell a foul odor coming from him tonight and noted the wound on his heel.  He has a dressing on it that he reports was placed when he was in the hospital.  Wife says she was unaware of the wound.       Home Medications Prior to Admission medications   Medication Sig Start Date End Date Taking? Authorizing Provider  ascorbic acid (VITAMIN C) 500 MG tablet Take 500-1,000 mg by mouth daily.    [provider]  Cholecalciferol (VITAMIN D3 PO) Take 1 capsule by mouth daily.    [provider]  clopidogrel  (PLAVIX ) 75 MG tablet Take 1 tablet (75 mg total) by mouth daily. 12/09/23   Magdalene School, MD  insulin  aspart (NOVOLOG  FLEXPEN) 100 UNIT/ML FlexPen Inject 4 Units into the skin 3 (three) times daily with meals. 12/09/23 01/08/24  Magdalene School, MD  insulin  detemir (LEVEMIR  FLEXTOUCH) 100 UNIT/ML FlexPen Inject 25 Units into the skin daily at 10 pm. 12/09/23 01/08/24  Magdalene School, MD  MAGNESIUM  PO Take 1 tablet by mouth daily.    [provider]  MELATONIN GUMMIES PO Take 1-2 tablets by mouth at bedtime as needed (for sleep).    [provider]  metFORMIN  (GLUCOPHAGE ) 500 MG tablet Take 1 tablet (500 mg total) by mouth 2 (two) times daily with a meal. Patient taking differently: Take 1,000 mg by mouth 2 (two) times daily with a meal. 11/30/17   Angiulli, Everlyn Hockey, PA-C  TUMS E-X 750 750 MG chewable tablet Chew 1 tablet by mouth 2 (two) times daily as needed for heartburn.    [provider]   VITAMIN A PO Take 1 capsule by mouth daily.    [provider]      Allergies    Oxycodone     Review of Systems   Review of Systems  Physical Exam Updated Vital Signs BP 138/76   Pulse 95   Temp 98.7 F (37.1 C)   Resp 18   Ht 6\' 2"  (1.88 m)   Wt 87.3 kg   SpO2 100%   BMI 24.71 kg/m  Physical Exam Vitals and nursing note reviewed.  Constitutional:      General: He is not in acute distress.    Appearance: He is well-developed.  HENT:     Head: Normocephalic and atraumatic.     Mouth/Throat:     Mouth: Mucous membranes are moist.  Eyes:     General: Vision grossly intact. Gaze aligned appropriately.     Extraocular Movements: Extraocular movements intact.     Conjunctiva/sclera: Conjunctivae normal.  Cardiovascular:     Rate and Rhythm: Normal rate and regular rhythm.     Pulses: Normal pulses.     Heart sounds: Normal heart sounds, S1 normal and S2 normal. No murmur heard.    No friction rub. No gallop.  Pulmonary:     Effort: Pulmonary effort is normal. No respiratory distress.     Breath sounds: Normal breath sounds.  Abdominal:  Palpations: Abdomen is soft.     Tenderness: There is no abdominal tenderness. There is no guarding or rebound.     Hernia: No hernia is present.  Musculoskeletal:        General: No swelling.     Cervical back: Full passive range of motion without pain, normal range of motion and neck supple. No pain with movement, spinous process tenderness or muscular tenderness. Normal range of motion.     Right lower leg: No edema.     Left lower leg: No edema.  Skin:    General: Skin is warm and dry.     Capillary Refill: Capillary refill takes less than 2 seconds.     Findings: Wound (Breakdown and eschar posterior calcaneal area with foul-smelling drainage and erythema of the foot and lower leg) present. No ecchymosis, erythema or lesion.  Neurological:     Mental Status: He is alert and oriented to person, place, and time.      GCS: GCS eye subscore is 4. GCS verbal subscore is 5. GCS motor subscore is 6.     Cranial Nerves: Cranial nerves 2-12 are intact.     Sensory: Sensation is intact.     Motor: Motor function is intact. No weakness or abnormal muscle tone.     Coordination: Coordination is intact.  Psychiatric:        Mood and Affect: Mood normal.        Speech: Speech normal.        Behavior: Behavior normal.     ED Results / Procedures / Treatments   Labs (all labs ordered are listed, but only abnormal results are displayed) Labs Reviewed  COMPREHENSIVE METABOLIC PANEL WITH GFR - Abnormal; Notable for the following components:      Result Value   Sodium 132 (*)    Chloride 94 (*)    CO2 19 (*)    Glucose, Bld 191 (*)    Creatinine, Ser 1.45 (*)    Albumin 2.8 (*)    Total Bilirubin 1.3 (*)    GFR, Estimated 49 (*)    Anion gap 19 (*)    All other components within normal limits  CBC WITH DIFFERENTIAL/PLATELET - Abnormal; Notable for the following components:   WBC 16.9 (*)    RBC 4.01 (*)    Hemoglobin 12.2 (*)    HCT 36.6 (*)    Platelets 453 (*)    Neutro Abs 14.6 (*)    Monocytes Absolute 1.1 (*)    Abs Immature Granulocytes 0.19 (*)    All other components within normal limits  PROTIME-INR - Abnormal; Notable for the following components:   Prothrombin Time 15.6 (*)    All other components within normal limits  CULTURE, BLOOD (ROUTINE X 2)  CULTURE, BLOOD (ROUTINE X 2)  URINALYSIS, W/ REFLEX TO CULTURE (INFECTION SUSPECTED)  I-STAT CG4 LACTIC ACID, ED    EKG None  Radiology DG Chest Port 1 View Result Date: 12/16/2023 EXAM: 1 VIEW XRAY OF THE CHEST 12/16/2023 12:55:00 AM COMPARISON: 12/07/2023 CLINICAL HISTORY: Infection. FINDINGS: LUNGS AND PLEURA: No focal pulmonary opacity. No pulmonary edema. No pleural effusion. No pneumothorax. HEART AND MEDIASTINUM: No acute abnormality of the cardiac and mediastinal silhouettes. BONES AND SOFT TISSUES: No acute osseous abnormality.  IMPRESSION: 1. No acute process. Electronically signed by: Zadie Herter MD 12/16/2023 01:04 AM EDT RP Workstation: [id number]   DG Foot Complete Right Result Date: 12/16/2023 EXAM: 3 or more VIEW(S) XRAY OF THE RIGHT FOOT 12/16/2023  12:55:00 AM COMPARISON: None available. CLINICAL HISTORY: Infection. FINDINGS: BONES AND JOINTS: Mild degenerative changes of the first MTP (metatarsophalangeal) joint. Status post fifth digit amputation at the level of the mid metatarsal shaft. Small posterior and plantar calcaneal enthesophytes. SOFT TISSUES: Mild soft tissue gas overlying the calcaneus, without definite cortical destruction. IMPRESSION: 1. Mild soft tissue gas overlying the calcaneus, without definite cortical destruction to suggest osteomyelitis. 2. Status post fifth digit amputation at the level of the mid metatarsal shaft. Electronically signed by: Zadie Herter MD 12/16/2023 01:03 AM EDT RP Workstation: [id number]    Procedures Procedures    Medications Ordered in ED Medications  Ampicillin-Sulbactam (UNASYN) 3 g in sodium chloride  0.9 % 100 mL IVPB (0 g Intravenous Stopped 12/16/23 0127)    And  linezolid (ZYVOX) IVPB 600 mg (600 mg Intravenous New Bag/Given 12/16/23 0127)    ED Course/ Medical Decision Making/ A&P                                 Medical Decision Making Amount and/or Complexity of Data Reviewed Labs: ordered. Radiology: ordered.  Risk Prescription drug management.   Patient presents to the emergency department with concerns over right heel infection.  Patient does have a history of diabetes and is status post left BKA secondary to osteomyelitis of the left foot.  Examination reveals large ulcerated area with eschar on the right heel with surrounding swelling, erythema.  There is brownish foul-smelling drainage present.  X-ray shows soft tissue findings but no obvious osteomyelitis.  Antibiotics administered per diabetic foot order set.  MRI performed, likely  will not be read until morning.  Will admit to hospitalist service for management of diabetic foot infection.        Final Clinical Impression(s) / ED Diagnoses Final diagnoses:  Diabetic foot infection [facility])    Rx / DC Orders ED Discharge Orders     None         Tayveon Lombardo, Marine Sia, MD 12/16/23 [phone]

## 2023-12-16 NOTE — Progress Notes (Signed)
 Pt arrived to 4E from ED.  Skin assessment completed with two nurses. Telemetry applied, CCMD notified.  Pt assessment complete. Oriented him to room and call bell.  No immediate needs at this time.

## 2023-12-16 NOTE — H&P (View-Only) (Signed)
 ORTHOPAEDIC CONSULTATION  REQUESTING PHYSICIAN: Rosena Conradi, MD  Chief Complaint: Abscess and osteomyelitis right hindfoot.  HPI: Kevin Bradford is a 78 y.o. male who presents with foul-smelling ulcer right heel.  Patient has recently been hospitalized.  Hemoglobin A1c greater than 13.  Past Medical History:  Diagnosis Date   Arthritis    "joint pain" (08/16/2017)   Below knee amputation (HCC)    Cellulitis and abscess of leg 10/18/2014   CKD (chronic kidney disease) stage 3, GFR 30-59 ml/min (HCC)    creatinine increases with antibiotics per pt, no known kidney disease per patient   Dehiscence of closure of skin, subsequent encounter    Diabetic foot ulcer (HCC)    left   Diabetic peripheral neuropathy (HCC)    GERD (gastroesophageal reflux disease)    12/26/2017- "not this year."    Osteomyelitis (HCC)    left transmetatarsal    Stroke (HCC)    Type II diabetes mellitus (HCC)    Past Surgical History:  Procedure Laterality Date   AMPUTATION Left 07/19/2017   Procedure: LEFT TRANSMETATARSAL AMPUTATION;  Surgeon: Timothy Ford, MD;  Location: [facility] OR;  Service: Orthopedics;  Laterality: Left;   AMPUTATION Left 08/16/2017   REVISION LEFT TRANSMETATARSAL AMPUTATION   AMPUTATION Left 11/22/2017   Procedure: LEFT BELOW KNEE AMPUTATION;  Surgeon: Timothy Ford, MD;  Location: [facility] OR;  Service: Orthopedics;  Laterality: Left;   CATARACT EXTRACTION W/ INTRAOCULAR LENS  IMPLANT, BILATERAL  ~ 2016   EYE SURGERY Bilateral    catarct   I & D EXTREMITY Left 10/17/2014   Procedure: IRRIGATION AND DEBRIDEMENT EXTREMITY LEFT FOOT;  Surgeon: Timothy Ford, MD;  Location: [facility] OR;  Service: Orthopedics;  Laterality: Left;   IR ANGIO INTRA EXTRACRAN SEL COM CAROTID INNOMINATE BILAT MOD SED  05/07/2018   IR ANGIO VERTEBRAL SEL SUBCLAVIAN INNOMINATE BILAT MOD SED  05/07/2018   STUMP REVISION Left 08/16/2017   Procedure: REVISION LEFT TRANSMETATARSAL AMPUTATION;  Surgeon: Timothy Ford, MD;   Location: [facility] OR;  Service: Orthopedics;  Laterality: Left;   STUMP REVISION Left 12/27/2017   Procedure: LEFT BELOW KNEE AMPUTATION REVISION;  Surgeon: Timothy Ford, MD;  Location: [facility] OR;  Service: Orthopedics;  Laterality: Left;   TOE AMPUTATION Right 2013   5th toe   Social History   Socioeconomic History   Marital status: Married    Spouse name: Not on file   Number of children: Not on file   Years of education: Not on file   Highest education level: Not on file  Occupational History   Not on file  Tobacco Use   Smoking status: Never   Smokeless tobacco: Never  Vaping Use   Vaping status: Never Used  Substance and Sexual Activity   Alcohol use: Yes    Alcohol/week: 1.0 standard drink of alcohol    Types: 1 Cans of beer per week    Comment:  "maybe 1 beer 2 times a month"-12/26/2017   Drug use: No   Sexual activity: Yes  Other Topics Concern   Not on file  Social History Narrative   Not on file   Social Drivers of Health   Financial Resource Strain: Low Risk  (05/06/2018)   Overall Financial Resource Strain (CARDIA)    Difficulty of Paying Living Expenses: Not hard at all  Food Insecurity: No Food Insecurity (12/08/2023)   Hunger Vital Sign    Worried About Running Out of Food in the Last Year: Never  true    Ran Out of Food in the Last Year: Never true  Transportation Needs: No Transportation Needs (12/08/2023)   PRAPARE - Administrator, Civil Service (Medical): No    Lack of Transportation (Non-Medical): No  Physical Activity: Sufficiently Active (05/06/2018)   Exercise Vital Sign    Days of Exercise per Week: 5 days    Minutes of Exercise per Session: 120 min  Stress: No Stress Concern Present (05/06/2018)   [employer] of Occupational Health - Occupational Stress Questionnaire    Feeling of Stress : Not at all  Social Connections: Moderately Isolated (12/08/2023)   Social Connection and Isolation Panel [NHANES]    Frequency of Communication  with Friends and Family: Three times a week    Frequency of Social Gatherings with Friends and Family: Three times a week    Attends Religious Services: Never    Active Member of Clubs or Organizations: No    Attends Engineer, structural: Never    Marital Status: Married   Family History  Problem Relation Age of Onset   Dementia Mother    - negative except otherwise stated in the family history section Allergies  Allergen Reactions   Roxicodone  [Oxycodone ] Anaphylaxis, Shortness Of Breath and Other (See Comments)    Throat closed   Prior to Admission medications   Medication Sig Start Date End Date Taking? Authorizing Provider  acetaminophen  (TYLENOL ) 500 MG tablet Take 500-1,000 mg by mouth every 6 (six) hours as needed for moderate pain (pain score 4-6), headache or fever.   Yes [provider]  Ascorbic Acid (VITAMIN C PO) Take 1 tablet by mouth daily.   Yes [provider]  Cholecalciferol (VITAMIN D3 PO) Take 1 capsule by mouth daily.   Yes [provider]  clopidogrel  (PLAVIX ) 75 MG tablet Take 1 tablet (75 mg total) by mouth daily. 12/09/23  Yes Magdalene School, MD  diclofenac Sodium (VOLTAREN) 1 % GEL Apply 1 Application topically 4 (four) times daily as needed (pain).   Yes [provider]  insulin  aspart (NOVOLOG  FLEXPEN) 100 UNIT/ML FlexPen Inject 4 Units into the skin 3 (three) times daily with meals. 12/09/23 01/08/24 Yes Magdalene School, MD  insulin  detemir (LEVEMIR  FLEXTOUCH) 100 UNIT/ML FlexPen Inject 25 Units into the skin daily at 10 pm. 12/09/23 01/08/24 Yes Magdalene School, MD  MAGNESIUM  PO Take 1 tablet by mouth daily.   Yes [provider]  metFORMIN  (GLUCOPHAGE ) 500 MG tablet Take 1,000 mg by mouth 2 (two) times daily with a meal.   Yes [provider]  VITAMIN A PO Take 1 capsule by mouth daily.   Yes [provider]   DG Chest Port 1 View Result Date: 12/16/2023 EXAM: 1 VIEW XRAY OF THE CHEST  12/16/2023 12:55:00 AM COMPARISON: 12/07/2023 CLINICAL HISTORY: Infection. FINDINGS: LUNGS AND PLEURA: No focal pulmonary opacity. No pulmonary edema. No pleural effusion. No pneumothorax. HEART AND MEDIASTINUM: No acute abnormality of the cardiac and mediastinal silhouettes. BONES AND SOFT TISSUES: No acute osseous abnormality. IMPRESSION: 1. No acute process. Electronically signed by: Zadie Herter MD 12/16/2023 01:04 AM EDT RP Workstation: [id number]   DG Foot Complete Right Result Date: 12/16/2023 EXAM: 3 or more VIEW(S) XRAY OF THE RIGHT FOOT 12/16/2023 12:55:00 AM COMPARISON: None available. CLINICAL HISTORY: Infection. FINDINGS: BONES AND JOINTS: Mild degenerative changes of the first MTP (metatarsophalangeal) joint. Status post fifth digit amputation at the level of the mid metatarsal shaft. Small posterior and plantar calcaneal enthesophytes. SOFT  TISSUES: Mild soft tissue gas overlying the calcaneus, without definite cortical destruction. IMPRESSION: 1. Mild soft tissue gas overlying the calcaneus, without definite cortical destruction to suggest osteomyelitis. 2. Status post fifth digit amputation at the level of the mid metatarsal shaft. Electronically signed by: Zadie Herter MD 12/16/2023 01:03 AM EDT RP Workstation: [id number]   - pertinent xrays, CT, MRI studies were reviewed and independently interpreted  Positive ROS: All other systems have been reviewed and were otherwise negative with the exception of those mentioned in the HPI and as above.  Physical Exam: General: Alert, no acute distress Psychiatric: Patient is competent for consent with normal mood and affect Lymphatic: No axillary or cervical lymphadenopathy Cardiovascular: No pedal edema Respiratory: No cyanosis, no use of accessory musculature GI: No organomegaly, abdomen is soft and non-tender    Images:  @ENCIMAGES @  Labs:  Lab Results  Component Value Date   HGBA1C 13.7 (H) 12/08/2023   HGBA1C 8.2 (H)  05/07/2018   HGBA1C 6.7 (H) 11/22/2017   ESRSEDRATE 105 (H) 12/16/2023   ESRSEDRATE 119 (H) 10/18/2014   ESRSEDRATE 115 (H) 10/13/2014   CRP 27.8 (H) 12/16/2023   CRP 8.5 (H) 10/18/2014   CRP 21.9 (H) 10/13/2014   LABURIC 3.3 (L) 10/14/2014   REPTSTATUS PENDING 12/15/2023   REPTSTATUS PENDING 12/15/2023   GRAMSTAIN  07/19/2017    FEW WBC PRESENT, PREDOMINANTLY MONONUCLEAR MODERATE GRAM POSITIVE COCCI IN PAIRS MODERATE GRAM NEGATIVE RODS    CULT  12/15/2023    NO GROWTH < 12 HOURS Performed at [facility] Lab, [address], [place], [place] [address]    CULT  12/15/2023    NO GROWTH < 12 HOURS Performed at [facility], [address], [place], [place] [address]    LABORGA METHICILLIN RESISTANT STAPHYLOCOCCUS AUREUS 07/19/2017   LABORGA ENTEROCOCCUS FAECALIS 07/19/2017    Lab Results  Component Value Date   ALBUMIN 2.8 (L) 12/15/2023   ALBUMIN 3.9 05/06/2018   ALBUMIN 3.5 11/27/2017   PREALBUMIN <5 (L) 12/16/2023   LABURIC 3.3 (L) 10/14/2014        Latest Ref Rng & Units 12/16/2023    3:45 AM 12/15/2023   11:43 PM 12/07/2023    9:52 PM  CBC EXTENDED  WBC 4.0 - 10.5 K/uL 15.9  16.9    RBC 4.22 - 5.81 MIL/uL 3.42  4.01    Hemoglobin 13.0 - 17.0 g/dL 29.5  62.1  30.8   HCT 39.0 - 52.0 % 31.7  36.6  34.0   Platelets 150 - 400 K/uL 393  453    NEUT# 1.7 - 7.7 K/uL  14.6    Lymph# 0.7 - 4.0 K/uL  0.9      Neurologic: Patient does not have protective sensation bilateral lower extremities.   MUSCULOSKELETAL:   Skin: Examination patient has a necrotic ulcer on the right heel.  There is foul-smelling odor.  There is no ascending cellulitis.  Patient has a palpable popliteal pulse.  Review of the MRI scan shows extensive osteomyelitis of the calcaneus and a abscess in the retro-Achilles bursal area.  Most recent hemoglobin A1c 13.7.  White cell count 15.9.  Hemoglobin 10.4.  Albumin 2.8.  Review of the plain films shows destructive changes of the calcaneus  as well as air in the soft tissue in the hindfoot.  Assessment: Assessment: Uncontrolled type 2 diabetes with abscess and osteomyelitis right heel.  Plan: Plan: Recommended proceeding with a below-knee amputation.  Patient does have a palpable popliteal pulse  and this should heal well.  Discussed the importance of glucose control.  Plan to continue IV antibiotics and plan for transtibial amputation on Wednesday.  Thank you for the consult and the opportunity to see Mr. Shalin Vonbargen, MD [facility] [phone] 10:04 AM

## 2023-12-16 NOTE — Progress Notes (Addendum)
 Same day note  Kevin Bradford is a 78 y.o. male with past medical history significant for poorly controlled diabetes mellitus, history of CVA, CKD stage II, and history of osteomyelitis status post left BKA presented to the hospital with fever and heel wound.  Had temperature of 102 F at home.  Family also noted to have foul-smelling discharge from the wound.  In the ED patient was afebrile.  Had transient tachycardia and mild hypotension.  Creatinine was elevated at 1.4.  WBC elevated at 16, 900.  Lactic acid was 1.1.  X-ray of the right foot without any acute osteomyelitis.   Blood cultures were collected, MRI was obtained patient was given Unasyn and linezolid and was at the hospital for further evaluation treatment.  Patient seen and examined at bedside.  Patient was admitted to the hospital for right foot infection.  At the time of my evaluation, patient complains of discomfort and foul smell at the right foot.  Physical examination reveals elderly male, Communicative oriented.  Left below-knee amputation.  Right heel with necrotic foul-smelling ulceration.       Laboratory data and imaging was reviewed,  Assessment and Plan.  Diabetic right foot infection  Continue antibiotics with Unasyn and linezolid.  MRI of the foot pending.  Follow cultures and clinical course.  Check ABI.  I did reach out to podiatry who recommended orthopedics to review the foot.  Will try to reach out to orthopedics.  In the meantime we will continue with broad-spectrum antibiotics.    Insulin -dependent DM  Review of latest hemoglobin A1c was 13.7% in May 2025.  Continue sliding scale insulin  Accu-Cheks   CKD stage II with mild AKI. Creatinine today at 1.3.  Will continue to monitor.  Received hydration.  Hx of CVA  - Hold Plavix  pending MRI results       Spoke with the patient's wife at bedside.  No Charge  Signed,  Lindwood Rhody, MD [facility]

## 2023-12-16 NOTE — ED Notes (Signed)
 CCMD called, pt placed on monitor.

## 2023-12-16 NOTE — Hospital Course (Signed)
 Kevin Bradford is a 78 y.o. male with past medical history significant for poorly controlled diabetes mellitus, history of CVA, CKD stage II, and history of osteomyelitis status post left BKA presented to the hospital with fever and heel wound.  Had temperature of 102 F at home.  Family also noted to have foul-smelling discharge from the wound.  In the ED patient was afebrile.  Had transient tachycardia and mild hypotension.  Creatinine was elevated at 1.4.  WBC elevated at 16, 900.  Lactic acid was 1.1.  X-ray of the right foot without any acute osteomyelitis.    Blood cultures were collected, MRI was obtained patient was given Unasyn and linezolid and was at the hospital for further evaluation treatment.   Diabetic right foot infection  Continue antibiotics with Unasyn and linezolid.  MRI of the foot pending.  Follow cultures and clinical course.  Check ABI.    Insulin -dependent DM  Review of latest hemoglobin A1c was 13.7% in May 2025.  Continue sliding scale insulin  Accu-Cheks   CKD stage II with mild AKI. Creatinine today at 1.3.  Will continue to monitor.  Received hydration.  Hx of CVA  - Hold Plavix  pending MRI results

## 2023-12-16 NOTE — ED Notes (Signed)
 Patient transported to MRI

## 2023-12-16 NOTE — Consult Note (Signed)
 ORTHOPAEDIC CONSULTATION  REQUESTING PHYSICIAN: Rosena Conradi, MD  Chief Complaint: Abscess and osteomyelitis right hindfoot.  HPI: Kevin Bradford is a 78 y.o. male who presents with foul-smelling ulcer right heel.  Patient has recently been hospitalized.  Hemoglobin A1c greater than 13.  Past Medical History:  Diagnosis Date   Arthritis    "joint pain" (08/16/2017)   Below knee amputation (HCC)    Cellulitis and abscess of leg 10/18/2014   CKD (chronic kidney disease) stage 3, GFR 30-59 ml/min (HCC)    creatinine increases with antibiotics per pt, no known kidney disease per patient   Dehiscence of closure of skin, subsequent encounter    Diabetic foot ulcer (HCC)    left   Diabetic peripheral neuropathy (HCC)    GERD (gastroesophageal reflux disease)    12/26/2017- "not this year."    Osteomyelitis (HCC)    left transmetatarsal    Stroke (HCC)    Type II diabetes mellitus (HCC)    Past Surgical History:  Procedure Laterality Date   AMPUTATION Left 07/19/2017   Procedure: LEFT TRANSMETATARSAL AMPUTATION;  Surgeon: Timothy Ford, MD;  Location: [facility] OR;  Service: Orthopedics;  Laterality: Left;   AMPUTATION Left 08/16/2017   REVISION LEFT TRANSMETATARSAL AMPUTATION   AMPUTATION Left 11/22/2017   Procedure: LEFT BELOW KNEE AMPUTATION;  Surgeon: Timothy Ford, MD;  Location: [facility] OR;  Service: Orthopedics;  Laterality: Left;   CATARACT EXTRACTION W/ INTRAOCULAR LENS  IMPLANT, BILATERAL  ~ 2016   EYE SURGERY Bilateral    catarct   I & D EXTREMITY Left 10/17/2014   Procedure: IRRIGATION AND DEBRIDEMENT EXTREMITY LEFT FOOT;  Surgeon: Timothy Ford, MD;  Location: [facility] OR;  Service: Orthopedics;  Laterality: Left;   IR ANGIO INTRA EXTRACRAN SEL COM CAROTID INNOMINATE BILAT MOD SED  05/07/2018   IR ANGIO VERTEBRAL SEL SUBCLAVIAN INNOMINATE BILAT MOD SED  05/07/2018   STUMP REVISION Left 08/16/2017   Procedure: REVISION LEFT TRANSMETATARSAL AMPUTATION;  Surgeon: Timothy Ford, MD;   Location: [facility] OR;  Service: Orthopedics;  Laterality: Left;   STUMP REVISION Left 12/27/2017   Procedure: LEFT BELOW KNEE AMPUTATION REVISION;  Surgeon: Timothy Ford, MD;  Location: [facility] OR;  Service: Orthopedics;  Laterality: Left;   TOE AMPUTATION Right 2013   5th toe   Social History   Socioeconomic History   Marital status: Married    Spouse name: Not on file   Number of children: Not on file   Years of education: Not on file   Highest education level: Not on file  Occupational History   Not on file  Tobacco Use   Smoking status: Never   Smokeless tobacco: Never  Vaping Use   Vaping status: Never Used  Substance and Sexual Activity   Alcohol use: Yes    Alcohol/week: 1.0 standard drink of alcohol    Types: 1 Cans of beer per week    Comment:  "maybe 1 beer 2 times a month"-12/26/2017   Drug use: No   Sexual activity: Yes  Other Topics Concern   Not on file  Social History Narrative   Not on file   Social Drivers of Health   Financial Resource Strain: Low Risk  (05/06/2018)   Overall Financial Resource Strain (CARDIA)    Difficulty of Paying Living Expenses: Not hard at all  Food Insecurity: No Food Insecurity (12/08/2023)   Hunger Vital Sign    Worried About Running Out of Food in the Last Year: Never  true    Ran Out of Food in the Last Year: Never true  Transportation Needs: No Transportation Needs (12/08/2023)   PRAPARE - Administrator, Civil Service (Medical): No    Lack of Transportation (Non-Medical): No  Physical Activity: Sufficiently Active (05/06/2018)   Exercise Vital Sign    Days of Exercise per Week: 5 days    Minutes of Exercise per Session: 120 min  Stress: No Stress Concern Present (05/06/2018)   [employer] of Occupational Health - Occupational Stress Questionnaire    Feeling of Stress : Not at all  Social Connections: Moderately Isolated (12/08/2023)   Social Connection and Isolation Panel [NHANES]    Frequency of Communication  with Friends and Family: Three times a week    Frequency of Social Gatherings with Friends and Family: Three times a week    Attends Religious Services: Never    Active Member of Clubs or Organizations: No    Attends Engineer, structural: Never    Marital Status: Married   Family History  Problem Relation Age of Onset   Dementia Mother    - negative except otherwise stated in the family history section Allergies  Allergen Reactions   Roxicodone  [Oxycodone ] Anaphylaxis, Shortness Of Breath and Other (See Comments)    Throat closed   Prior to Admission medications   Medication Sig Start Date End Date Taking? Authorizing Provider  acetaminophen  (TYLENOL ) 500 MG tablet Take 500-1,000 mg by mouth every 6 (six) hours as needed for moderate pain (pain score 4-6), headache or fever.   Yes [provider]  Ascorbic Acid (VITAMIN C PO) Take 1 tablet by mouth daily.   Yes [provider]  Cholecalciferol (VITAMIN D3 PO) Take 1 capsule by mouth daily.   Yes [provider]  clopidogrel  (PLAVIX ) 75 MG tablet Take 1 tablet (75 mg total) by mouth daily. 12/09/23  Yes Magdalene School, MD  diclofenac Sodium (VOLTAREN) 1 % GEL Apply 1 Application topically 4 (four) times daily as needed (pain).   Yes [provider]  insulin  aspart (NOVOLOG  FLEXPEN) 100 UNIT/ML FlexPen Inject 4 Units into the skin 3 (three) times daily with meals. 12/09/23 01/08/24 Yes Magdalene School, MD  insulin  detemir (LEVEMIR  FLEXTOUCH) 100 UNIT/ML FlexPen Inject 25 Units into the skin daily at 10 pm. 12/09/23 01/08/24 Yes Magdalene School, MD  MAGNESIUM  PO Take 1 tablet by mouth daily.   Yes [provider]  metFORMIN  (GLUCOPHAGE ) 500 MG tablet Take 1,000 mg by mouth 2 (two) times daily with a meal.   Yes [provider]  VITAMIN A PO Take 1 capsule by mouth daily.   Yes [provider]   DG Chest Port 1 View Result Date: 12/16/2023 EXAM: 1 VIEW XRAY OF THE CHEST  12/16/2023 12:55:00 AM COMPARISON: 12/07/2023 CLINICAL HISTORY: Infection. FINDINGS: LUNGS AND PLEURA: No focal pulmonary opacity. No pulmonary edema. No pleural effusion. No pneumothorax. HEART AND MEDIASTINUM: No acute abnormality of the cardiac and mediastinal silhouettes. BONES AND SOFT TISSUES: No acute osseous abnormality. IMPRESSION: 1. No acute process. Electronically signed by: Zadie Herter MD 12/16/2023 01:04 AM EDT RP Workstation: [id number]   DG Foot Complete Right Result Date: 12/16/2023 EXAM: 3 or more VIEW(S) XRAY OF THE RIGHT FOOT 12/16/2023 12:55:00 AM COMPARISON: None available. CLINICAL HISTORY: Infection. FINDINGS: BONES AND JOINTS: Mild degenerative changes of the first MTP (metatarsophalangeal) joint. Status post fifth digit amputation at the level of the mid metatarsal shaft. Small posterior and plantar calcaneal enthesophytes. SOFT  TISSUES: Mild soft tissue gas overlying the calcaneus, without definite cortical destruction. IMPRESSION: 1. Mild soft tissue gas overlying the calcaneus, without definite cortical destruction to suggest osteomyelitis. 2. Status post fifth digit amputation at the level of the mid metatarsal shaft. Electronically signed by: Zadie Herter MD 12/16/2023 01:03 AM EDT RP Workstation: [id number]   - pertinent xrays, CT, MRI studies were reviewed and independently interpreted  Positive ROS: All other systems have been reviewed and were otherwise negative with the exception of those mentioned in the HPI and as above.  Physical Exam: General: Alert, no acute distress Psychiatric: Patient is competent for consent with normal mood and affect Lymphatic: No axillary or cervical lymphadenopathy Cardiovascular: No pedal edema Respiratory: No cyanosis, no use of accessory musculature GI: No organomegaly, abdomen is soft and non-tender    Images:  @ENCIMAGES @  Labs:  Lab Results  Component Value Date   HGBA1C 13.7 (H) 12/08/2023   HGBA1C 8.2 (H)  05/07/2018   HGBA1C 6.7 (H) 11/22/2017   ESRSEDRATE 105 (H) 12/16/2023   ESRSEDRATE 119 (H) 10/18/2014   ESRSEDRATE 115 (H) 10/13/2014   CRP 27.8 (H) 12/16/2023   CRP 8.5 (H) 10/18/2014   CRP 21.9 (H) 10/13/2014   LABURIC 3.3 (L) 10/14/2014   REPTSTATUS PENDING 12/15/2023   REPTSTATUS PENDING 12/15/2023   GRAMSTAIN  07/19/2017    FEW WBC PRESENT, PREDOMINANTLY MONONUCLEAR MODERATE GRAM POSITIVE COCCI IN PAIRS MODERATE GRAM NEGATIVE RODS    CULT  12/15/2023    NO GROWTH < 12 HOURS Performed at [facility], [address], [place], [place] [address]    CULT  12/15/2023    NO GROWTH < 12 HOURS Performed at [facility], [address], [place], [place] [address]    LABORGA METHICILLIN RESISTANT STAPHYLOCOCCUS AUREUS 07/19/2017   LABORGA ENTEROCOCCUS FAECALIS 07/19/2017    Lab Results  Component Value Date   ALBUMIN 2.8 (L) 12/15/2023   ALBUMIN 3.9 05/06/2018   ALBUMIN 3.5 11/27/2017   PREALBUMIN <5 (L) 12/16/2023   LABURIC 3.3 (L) 10/14/2014        Latest Ref Rng & Units 12/16/2023    3:45 AM 12/15/2023   11:43 PM 12/07/2023    9:52 PM  CBC EXTENDED  WBC 4.0 - 10.5 K/uL 15.9  16.9    RBC 4.22 - 5.81 MIL/uL 3.42  4.01    Hemoglobin 13.0 - 17.0 g/dL 29.5  62.1  30.8   HCT 39.0 - 52.0 % 31.7  36.6  34.0   Platelets 150 - 400 K/uL 393  453    NEUT# 1.7 - 7.7 K/uL  14.6    Lymph# 0.7 - 4.0 K/uL  0.9      Neurologic: Patient does not have protective sensation bilateral lower extremities.   MUSCULOSKELETAL:   Skin: Examination patient has a necrotic ulcer on the right heel.  There is foul-smelling odor.  There is no ascending cellulitis.  Patient has a palpable popliteal pulse.  Review of the MRI scan shows extensive osteomyelitis of the calcaneus and a abscess in the retro-Achilles bursal area.  Most recent hemoglobin A1c 13.7.  White cell count 15.9.  Hemoglobin 10.4.  Albumin 2.8.  Review of the plain films shows destructive changes of the calcaneus  as well as air in the soft tissue in the hindfoot.  Assessment: Assessment: Uncontrolled type 2 diabetes with abscess and osteomyelitis right heel.  Plan: Plan: Recommended proceeding with a below-knee amputation.  Patient does have a palpable popliteal pulse  and this should heal well.  Discussed the importance of glucose control.  Plan to continue IV antibiotics and plan for transtibial amputation on Wednesday.  Thank you for the consult and the opportunity to see Mr. Shalin Vonbargen, MD [facility] [phone] 10:04 AM

## 2023-12-17 DIAGNOSIS — E11628 Type 2 diabetes mellitus with other skin complications: Secondary | ICD-10-CM | POA: Diagnosis not present

## 2023-12-17 DIAGNOSIS — L089 Local infection of the skin and subcutaneous tissue, unspecified: Secondary | ICD-10-CM | POA: Diagnosis not present

## 2023-12-17 LAB — GLUCOSE, CAPILLARY
Glucose-Capillary: 168 mg/dL — ABNORMAL HIGH (ref 70–99)
Glucose-Capillary: 175 mg/dL — ABNORMAL HIGH (ref 70–99)
Glucose-Capillary: 175 mg/dL — ABNORMAL HIGH (ref 70–99)
Glucose-Capillary: 183 mg/dL — ABNORMAL HIGH (ref 70–99)
Glucose-Capillary: 202 mg/dL — ABNORMAL HIGH (ref 70–99)
Glucose-Capillary: 210 mg/dL — ABNORMAL HIGH (ref 70–99)

## 2023-12-17 LAB — BASIC METABOLIC PANEL WITH GFR
Anion gap: 9 (ref 5–15)
BUN: 15 mg/dL (ref 8–23)
CO2: 22 mmol/L (ref 22–32)
Calcium: 8.5 mg/dL — ABNORMAL LOW (ref 8.9–10.3)
Chloride: 102 mmol/L (ref 98–111)
Creatinine, Ser: 1.07 mg/dL (ref 0.61–1.24)
GFR, Estimated: >60 mL/min (ref >60)
Glucose, Bld: 179 mg/dL — ABNORMAL HIGH (ref 70–99)
Potassium: 4.1 mmol/L (ref 3.5–5.1)
Sodium: 133 mmol/L — ABNORMAL LOW (ref 135–145)

## 2023-12-17 LAB — MAGNESIUM: Magnesium: 1.9 mg/dL (ref 1.7–2.4)

## 2023-12-17 LAB — CBC
HCT: 32 % — ABNORMAL LOW (ref 39.0–52.0)
Hemoglobin: 10.6 g/dL — ABNORMAL LOW (ref 13.0–17.0)
MCH: 31 pg (ref 26.0–34.0)
MCHC: 33.1 g/dL (ref 30.0–36.0)
MCV: 93.6 fL (ref 80.0–100.0)
Platelets: 407 10*3/uL — ABNORMAL HIGH (ref 150–400)
RBC: 3.42 MIL/uL — ABNORMAL LOW (ref 4.22–5.81)
RDW: 12.2 % (ref 11.5–15.5)
WBC: 13.8 10*3/uL — ABNORMAL HIGH (ref 4.0–10.5)
nRBC: 0 % (ref 0.0–0.2)

## 2023-12-17 LAB — PHOSPHORUS: Phosphorus: 3.7 mg/dL (ref 2.5–4.6)

## 2023-12-17 MED ORDER — ZINC SULFATE 220 (50 ZN) MG PO CAPS
220.0000 mg | ORAL_CAPSULE | Freq: Every day | ORAL | Status: DC
  Administered 2023-12-18 – 2023-12-25 (×7): 220 mg via ORAL
  Filled 2023-12-17 (×7): qty 1

## 2023-12-17 MED ORDER — VITAMIN C 500 MG PO TABS
500.0000 mg | ORAL_TABLET | Freq: Two times a day (BID) | ORAL | Status: DC
  Administered 2023-12-17 – 2023-12-25 (×15): 500 mg via ORAL
  Filled 2023-12-17 (×15): qty 1

## 2023-12-17 MED ORDER — ADULT MULTIVITAMIN W/MINERALS CH
1.0000 | ORAL_TABLET | Freq: Every day | ORAL | Status: DC
  Administered 2023-12-18 – 2023-12-25 (×7): 1 via ORAL
  Filled 2023-12-17 (×7): qty 1

## 2023-12-17 MED ORDER — ENSURE MAX PROTEIN PO LIQD
11.0000 [oz_av] | Freq: Two times a day (BID) | ORAL | Status: DC
  Administered 2023-12-17 – 2023-12-23 (×4): 11 [oz_av] via ORAL
  Filled 2023-12-17 (×18): qty 330

## 2023-12-17 NOTE — Progress Notes (Signed)
 Initial Nutrition Assessment  DOCUMENTATION CODES:   Not applicable  INTERVENTION:   Ensure Max protein supplement po BID, each supplement provides 150kcal and 30g of protein.  MVI po daily   Vitamin C 500mg  po BID   Zinc 220mg  po daily x 14 days   Pt at refeed risk; recommend monitor potassium, magnesium  and phosphorus labs daily until stable  Daily weights  NUTRITION DIAGNOSIS:   Increased nutrient needs related to wound healing as evidenced by estimated needs.  GOAL:   Patient will meet greater than or equal to 90% of their needs  MONITOR:   PO intake, Supplement acceptance, Labs, Weight trends, Skin, I & O's  REASON FOR ASSESSMENT:   Consult Wound healing  ASSESSMENT:   78 y/o male with h/o CKD II, uncontrolled IDDM, stroke, osteomyelitis s/p L BKA and GERD who is admitted with R diabetic foot infection with abscess and calcaneal osteomyelitis.  RD working remotely.  RD unable to reach pt by phone. Pt with increased estimated needs r/t wound healing. RD will add supplements and vitamins to help pt meet his estimated needs and to support wound healing. RD will obtain exam and history at follow up. Plan is for R BKA next week.   Medications reviewed and include: insulin , linezolid, melatonin, unasyn   Labs reviewed: Na 133(L), K 4.1 wnl, P 3.7 wnl, Mg 1.9 wnl Wbc- 13.8(H), Hgb 10.6(L), Hct 32.0(L) Cbgs- 183, 175, 175, 168 x 24hrs  AIC 13.7(H)- 5/23  NUTRITION - FOCUSED PHYSICAL EXAM: Unable to perform at this time   Diet Order:   Diet Order             Diet Carb Modified Fluid consistency: Thin  Diet effective now                  EDUCATION NEEDS:   Not appropriate for education at this time  Skin:  Skin Assessment: Reviewed RN Assessment (R heel wound)  Last BM:  pta  Height:   Ht Readings from Last 1 Encounters:  12/15/23 6\' 2"  (1.88 m)    Weight:   Wt Readings from Last 1 Encounters:  12/17/23 82.2 kg    Ideal Body Weight:   86.3 kg  BMI:  Body mass index is 23.27 kg/m.  Estimated Nutritional Needs:   Kcal:  2100-2400kcal/day  Protein:  105-120g/day  Fluid:  2.1-2.4L/day  Torrance Freestone MS, RD, LDN If unable to be reached, please send secure chat to "RD inpatient" available from 8:00a-4:00p daily

## 2023-12-17 NOTE — Progress Notes (Signed)
 PROGRESS NOTE  Kevin Bradford WGN:[id number] DOB: [birth year] DOA: 12/15/2023 PCP: Jearldine Mina, MD   LOS: 1 day   Brief narrative:  Kevin Bradford is a 78 y.o. male with past medical history significant for poorly controlled diabetes mellitus, history of CVA, CKD stage II, and history of osteomyelitis status post left BKA presented to the hospital with fever and heel wound.  Had temperature of 102 F at home.  Family also noted to have foul-smelling discharge from the wound.  In the ED patient was afebrile.  Had transient tachycardia and mild hypotension.  Creatinine was elevated at 1.4.  WBC elevated at 16, 900.  Lactic acid was 1.1.  X-ray of the right foot without any acute osteomyelitis.   Blood cultures were collected, MRI was obtained patient was given Unasyn and linezolid and was at the hospital for further evaluation treatment.   Assessment/Plan: Principal Problem:   Diabetic infection of right foot (HCC) Active Problems:   CKD (chronic kidney disease) stage 2, GFR 60-89 ml/min   Type 2 diabetes mellitus with peripheral neuropathy (HCC)   History of stroke   Acute osteomyelitis of right calcaneus (HCC)   Cutaneous abscess of right foot  Diabetic right foot infection with abscess and calcaneal osteomyelitis Continue antibiotics with Unasyn and linezolid.  MRI of the foot on the right shows soft tissue ulceration with gas and signal intensity compatible with calcaneal osteomyelitis with fluid collection possibility of abscess and tenosynovitis.  Dr Julio Ohm from orthopedics has seen the patient and plan for amputation at this time.  Blood cultures negative in 1 day.  Will continue with antibiotics.  Patient feels better today.  Not much pain.  Insulin -dependent DM  Review of latest hemoglobin A1c was 13.7 in May 2025.  Continue sliding scale insulin  Accu-Cheks.  Latest POC glucose of 175.   CKD stage II with mild AKI. Creatinine today at 1.0 from 1.3.  Will continue to monitor.  Received  hydration.   Hx of CVA  - Hold Plavix  at this time.  DVT prophylaxis: SCDs Start: 12/16/23 0346   Disposition: Uncertain, might need rehabilitation after surgery..  Status is: Inpatient Remains inpatient appropriate because: IV antibiotic, calcaneal abscess/osteomyelitis, need for surgical intervention.    Code Status:     Code Status: Full Code  Family Communication: Spoke with the patient's spouse at bedside on 12/16/2023  Consultants: Orthopedics Dr Julio Ohm  Procedures: None yet  Anti-infectives:  Linezolid and Unasyn  Anti-infectives (From admission, onward)    Start     Dose/Rate Route Frequency Ordered Stop   12/16/23 1200  linezolid (ZYVOX) tablet 600 mg       Placed in "And" Linked Group   600 mg Oral Every 12 hours 12/16/23 0347 12/22/23 2159   12/16/23 0600  Ampicillin-Sulbactam (UNASYN) 3 g in sodium chloride  0.9 % 100 mL IVPB        3 g 200 mL/hr over 30 Minutes Intravenous Every 6 hours 12/16/23 0348     12/16/23 0045  Ampicillin-Sulbactam (UNASYN) 3 g in sodium chloride  0.9 % 100 mL IVPB       Placed in "And" Linked Group   3 g 200 mL/hr over 30 Minutes Intravenous  Once 12/16/23 0036 12/16/23 0127   12/16/23 0045  linezolid (ZYVOX) IVPB 600 mg       Placed in "And" Linked Group   600 mg 300 mL/hr over 60 Minutes Intravenous  Once 12/16/23 0036 12/16/23 0319        Subjective: Today, patient  was seen and examined at bedside.  Patient states that his pain is under control.  Denies any nausea vomiting fever chills or rigor.  Was able to rest okay in the nighttime.  Objective: Vitals:   12/17/23 0411 12/17/23 0759  BP: 135/80 125/69  Pulse: 77 90  Resp: 12 17  Temp: 98.5 F (36.9 C) 98.4 F (36.9 C)  SpO2: 100% 97%    Intake/Output Summary (Last 24 hours) at 12/17/2023 0959 Last data filed at 12/17/2023 1610 Gross per 24 hour  Intake 2300 ml  Output 600 ml  Net 1700 ml   Filed Weights   12/15/23 2322 12/17/23 0411  Weight: 87.3 kg 82.2 kg    Body mass index is 23.27 kg/m.   Physical Exam:  GENERAL: Patient is alert awake and oriented. Not in obvious distress.  Elderly male, Communicative HENT: No scleral pallor or icterus. Pupils equally reactive to light. Oral mucosa is moist NECK: is supple, no gross swelling noted. CHEST: Clear to auscultation. No crackles or wheezes.  Diminished breath sounds bilaterally. CVS: S1 and S2 heard, no murmur. Regular rate and rhythm.  ABDOMEN: Soft, non-tender, bowel sounds are present. EXTREMITIES: Right heel with necrotic infection, ulceration and foul odor.  Left below-knee amputation. CNS: Cranial nerves are intact.  SKIN: warm and dry, right ankle with foot infection.  Data Review: I have personally reviewed the following laboratory data and studies,  CBC: Recent Labs  Lab 12/15/23 2343 12/16/23 0345 12/17/23 0358  WBC 16.9* 15.9* 13.8*  NEUTROABS 14.6*  --   --   HGB 12.2* 10.4* 10.6*  HCT 36.6* 31.7* 32.0*  MCV 91.3 92.7 93.6  PLT 453* 393 407*   Basic Metabolic Panel: Recent Labs  Lab 12/15/23 2343 12/16/23 0345 12/17/23 0358  NA 132* 130* 133*  K 4.6 4.1 4.1  CL 94* 96* 102  CO2 19* 16* 22  GLUCOSE 191* 251* 179*  BUN 18 18 15   CREATININE 1.45* 1.30* 1.07  CALCIUM  9.2 8.0* 8.5*  MG  --   --  1.9  PHOS  --   --  3.7   Liver Function Tests: Recent Labs  Lab 12/15/23 2343  AST 40  ALT 26  ALKPHOS 117  BILITOT 1.3*  PROT 7.1  ALBUMIN 2.8*   No results for input(s): "LIPASE", "AMYLASE" in the last 168 hours. No results for input(s): "AMMONIA" in the last 168 hours. Cardiac Enzymes: No results for input(s): "CKTOTAL", "CKMB", "CKMBINDEX", "TROPONINI" in the last 168 hours. BNP (last 3 results) No results for input(s): "BNP" in the last 8760 hours.  ProBNP (last 3 results) No results for input(s): "PROBNP" in the last 8760 hours.  CBG: Recent Labs  Lab 12/16/23 1654 12/16/23 2139 12/17/23 0048 12/17/23 0409 12/17/23 0758  GLUCAP 228* 252*  202* 168* 175*   Recent Results (from the past 240 hours)  Culture, blood (routine x 2)     Status: None   Collection Time: 12/07/23 10:35 PM   Specimen: BLOOD  Result Value Ref Range Status   Specimen Description   Final    BLOOD RIGHT ANTECUBITAL Performed at [facility], [address] [place], [place] [address]    Special Requests   Final    BOTTLES DRAWN AEROBIC AND ANAEROBIC Blood Culture adequate volume Performed at [facility], [address], [place], [place] [address]    Culture   Final    NO GROWTH 5 DAYS Performed at [facility], [address]  [place], [place] [address]    Report Status 12/13/2023 FINAL  Final  Resp panel by RT-PCR (RSV, Flu A&B, Covid) Peripheral     Status: None   Collection Time: 12/07/23 10:40 PM   Specimen: Peripheral; Nasal Swab  Result Value Ref Range Status   SARS Coronavirus 2 by RT PCR NEGATIVE NEGATIVE Final    Comment: (NOTE) SARS-CoV-2 target nucleic acids are NOT DETECTED.  The SARS-CoV-2 RNA is generally detectable in upper respiratory specimens during the acute phase of infection. The lowest concentration of SARS-CoV-2 viral copies this assay can detect is 138 copies/mL. A negative result does not preclude SARS-Cov-2 infection and should not be used as the sole basis for treatment or other patient management decisions. A negative result may occur with  improper specimen collection/handling, submission of specimen other than nasopharyngeal swab, presence of viral mutation(s) within the areas targeted by this assay, and inadequate number of viral copies(<138 copies/mL). A negative result must be combined with clinical observations, patient history, and epidemiological information. The expected result is Negative.  Fact Sheet for Patients:  BloggerCourse.com  Fact Sheet for Healthcare Providers:  SeriousBroker.it  This test is no  t yet approved or cleared by the [place]  FDA and  has been authorized for detection and/or diagnosis of SARS-CoV-2 by FDA under an Emergency Use Authorization (EUA). This EUA will remain  in effect (meaning this test can be used) for the duration of the COVID-19 declaration under Section 564(b)(1) of the Act, 21 U.S.C.section 360bbb-3(b)(1), unless the authorization is terminated  or revoked sooner.       Influenza A by PCR NEGATIVE NEGATIVE Final   Influenza B by PCR NEGATIVE NEGATIVE Final    Comment: (NOTE) The Xpert Xpress SARS-CoV-2/FLU/RSV plus assay is intended as an aid in the diagnosis of influenza from Nasopharyngeal swab specimens and should not be used as a sole basis for treatment. Nasal washings and aspirates are unacceptable for Xpert Xpress SARS-CoV-2/FLU/RSV testing.  Fact Sheet for Patients: BloggerCourse.com  Fact Sheet for Healthcare Providers: SeriousBroker.it  This test is not yet approved or cleared by the [place]  FDA and has been authorized for detection and/or diagnosis of SARS-CoV-2 by FDA under an Emergency Use Authorization (EUA). This EUA will remain in effect (meaning this test can be used) for the duration of the COVID-19 declaration under Section 564(b)(1) of the Act, 21 U.S.C. section 360bbb-3(b)(1), unless the authorization is terminated or revoked.     Resp Syncytial Virus by PCR NEGATIVE NEGATIVE Final    Comment: (NOTE) Fact Sheet for Patients: BloggerCourse.com  Fact Sheet for Healthcare Providers: SeriousBroker.it  This test is not yet approved or cleared by the [place]  FDA and has been authorized for detection and/or diagnosis of SARS-CoV-2 by FDA under an Emergency Use Authorization (EUA). This EUA will remain in effect (meaning this test can be used) for the duration of the COVID-19 declaration under Section  564(b)(1) of the Act, 21 U.S.C. section 360bbb-3(b)(1), unless the authorization is terminated or revoked.  Performed at [facility], [address], [place], [place] [address]   MRSA Next Gen by PCR, Nasal     Status: None   Collection Time: 12/08/23 11:33 AM   Specimen: Nasal Mucosa; Nasal Swab  Result Value Ref Range Status   MRSA by PCR Next Gen NOT DETECTED NOT DETECTED Final    Comment: (NOTE) The GeneXpert MRSA Assay (FDA approved for NASAL specimens only), is one component of a comprehensive MRSA colonization surveillance program. It  is not intended to diagnose MRSA infection nor to guide or monitor treatment for MRSA infections. Test performance is not FDA approved in patients less than 46 years old. Performed at [facility], [address], [place], [place] [address]   Culture, blood (routine x 2)     Status: None   Collection Time: 12/08/23 12:43 PM   Specimen: BLOOD LEFT ARM  Result Value Ref Range Status   Specimen Description BLOOD LEFT ARM  Final   Special Requests   Final    BOTTLES DRAWN AEROBIC AND ANAEROBIC Blood Culture adequate volume   Culture   Final    NO GROWTH 5 DAYS Performed at [facility], [address], [place], [place] [address]    Report Status 12/13/2023 FINAL  Final  Culture, blood (Routine x 2)     Status: None (Preliminary result)   Collection Time: 12/15/23 11:43 PM   Specimen: BLOOD RIGHT HAND  Result Value Ref Range Status   Specimen Description BLOOD RIGHT HAND  Final   Special Requests   Final    BOTTLES DRAWN AEROBIC AND ANAEROBIC Blood Culture results may not be optimal due to an inadequate volume of blood received in culture bottles   Culture   Final    NO GROWTH 1 DAY Performed at [facility], [address] [place], [place] [address]    Report Status PENDING  Incomplete  Culture, blood (Routine x 2)     Status: None (Preliminary result)   Collection Time: 12/15/23 11:43 PM    Specimen: BLOOD RIGHT HAND  Result Value Ref Range Status   Specimen Description BLOOD RIGHT HAND  Final   Special Requests   Final    BOTTLES DRAWN AEROBIC ONLY Blood Culture adequate volume   Culture   Final    NO GROWTH 1 DAY Performed at [facility], [address], [place], [place] [address]    Report Status PENDING  Incomplete     Studies: MRI Right foot without contrast Result Date: 12/16/2023 CLINICAL DATA:  Soft tissue infection suspected, foot, xray done EXAM: MRI OF THE RIGHT FOREFOOT WITHOUT CONTRAST TECHNIQUE: Multiplanar, multisequence MR imaging of the right hindfoot was performed. No intravenous contrast was administered. COMPARISON:  Right foot radiographs dated Dec 16, 2023 at 12:55 a.m. FINDINGS: Bones/Joint/Cartilage Soft tissue ulceration overlying the posterior heel with foci gas tracking through the underlying soft tissues and into the posterior calcaneal tuberosity with associated marrow edema and confluent T1 hypointensity of the posterior calcaneus, compatible with osteomyelitis. No marrow signal abnormality identified elsewhere to suggest osteomyelitis. No joint effusion. Mild degenerative changes of the posterior subtalar joint with joint space narrowing and osteophytosis. Moderate degenerative changes of the talonavicular joint with joint space narrowing and dorsal spurring. Ligaments Medial and lateral ligamentous complex of the ankle is grossly intact. Muscles and Tendons Peroneal: Peroneal longus tendinosis and tenosynovitis, most pronounced at the level of the midfoot, adjacent to the cuboid. Peroneal brevis intact. Posteromedial: Posterior tibialis tendinosis and mild tenosynovitis. Tenosynovitis of the flexor hallucis longus and flexor digitorum longus tendons at the level of the knot of Henry. Anterior: Tibialis anterior tendon intact. Extensor hallucis longus tendon intact Extensor digitorum longus tendon intact. Achilles:  Intact. Plantar Fascia: Thickening  of the central cord of the plantar fascia with edema of plantar calcaneal spurs, suggestive of acute on chronic plantar fasciitis. No discrete intramuscular collection identified. Soft tissue Soft tissue ulceration overlying the posterior heel with associated  foci of soft tissue gas tracking throughout the underlying soft tissues with areas of lobulated heterogenous T2 hyperintense fluid signal with internal foci of gas extending posteromedially and posterolaterally along the posterior calcaneal soft tissues measuring approximately 3.2 x 1.0 x 5.5 cm on the right and 3.5 x 1.5 x 3.0 cm on the left. This could represent early abscess formation or organizing phlegmon. Circumferential subcutaneous edema along the visualized right lower extremity extending through the ankle and hindfoot. IMPRESSION: 1. Soft tissue ulceration overlying the posterior heel with associated soft tissue gas and intraosseous gas extending into the calcaneal tuberosity with marrow signal abnormality, compatible with calcaneal osteomyelitis. 2. Lobulated heterogenous T2 hyperintense fluid signal with internal foci of gas extending posteromedially and posterolaterally along the posterior calcaneal soft tissues deep to the ulceration measuring approximately 3.2 x 1.0 x 5.5 cm on the right and 3.5 x 1.5 x 3.0 cm on the left. This could represent early abscess formation or organizing phlegmon. Circumferential subcutaneous edema along the visualized right lower extremity extending through the ankle and hindfoot is most compatible with cellulitis. 3. Peroneal longus tendinosis and tenosynovitis, most pronounced at the level of the midfoot. 4. Marked tenosynovitis of the flexor hallucis longus and flexor digitorum longus tendons at the level of the knot of Henry. Posterior tibialis tendinosis and mild tenosynovitis. 5. Acute on chronic plantar fasciitis. 6. Mild-to-moderate degenerative changes of the hindfoot. Electronically Signed   By: Mannie Seek M.D.   On: 12/16/2023 14:57   DG Chest Port 1 View Result Date: 12/16/2023 EXAM: 1 VIEW XRAY OF THE CHEST 12/16/2023 12:55:00 AM COMPARISON: 12/07/2023 CLINICAL HISTORY: Infection. FINDINGS: LUNGS AND PLEURA: No focal pulmonary opacity. No pulmonary edema. No pleural effusion. No pneumothorax. HEART AND MEDIASTINUM: No acute abnormality of the cardiac and mediastinal silhouettes. BONES AND SOFT TISSUES: No acute osseous abnormality. IMPRESSION: 1. No acute process. Electronically signed by: Zadie Herter MD 12/16/2023 01:04 AM EDT RP Workstation: [id number]   DG Foot Complete Right Result Date: 12/16/2023 EXAM: 3 or more VIEW(S) XRAY OF THE RIGHT FOOT 12/16/2023 12:55:00 AM COMPARISON: None available. CLINICAL HISTORY: Infection. FINDINGS: BONES AND JOINTS: Mild degenerative changes of the first MTP (metatarsophalangeal) joint. Status post fifth digit amputation at the level of the mid metatarsal shaft. Small posterior and plantar calcaneal enthesophytes. SOFT TISSUES: Mild soft tissue gas overlying the calcaneus, without definite cortical destruction. IMPRESSION: 1. Mild soft tissue gas overlying the calcaneus, without definite cortical destruction to suggest osteomyelitis. 2. Status post fifth digit amputation at the level of the mid metatarsal shaft. Electronically signed by: Zadie Herter MD 12/16/2023 01:03 AM EDT RP Workstation: [id number]      Rosena Conradi, MD  [facility] 12/17/2023  If 7PM-7AM, please contact night-coverage

## 2023-12-18 DIAGNOSIS — E11628 Type 2 diabetes mellitus with other skin complications: Secondary | ICD-10-CM | POA: Diagnosis not present

## 2023-12-18 DIAGNOSIS — L089 Local infection of the skin and subcutaneous tissue, unspecified: Secondary | ICD-10-CM | POA: Diagnosis not present

## 2023-12-18 LAB — GLUCOSE, CAPILLARY
Glucose-Capillary: 136 mg/dL — ABNORMAL HIGH (ref 70–99)
Glucose-Capillary: 160 mg/dL — ABNORMAL HIGH (ref 70–99)
Glucose-Capillary: 167 mg/dL — ABNORMAL HIGH (ref 70–99)
Glucose-Capillary: 169 mg/dL — ABNORMAL HIGH (ref 70–99)
Glucose-Capillary: 179 mg/dL — ABNORMAL HIGH (ref 70–99)
Glucose-Capillary: 200 mg/dL — ABNORMAL HIGH (ref 70–99)
Glucose-Capillary: 215 mg/dL — ABNORMAL HIGH (ref 70–99)

## 2023-12-18 LAB — BASIC METABOLIC PANEL WITH GFR
Anion gap: 12 (ref 5–15)
BUN: 12 mg/dL (ref 8–23)
CO2: 22 mmol/L (ref 22–32)
Calcium: 8.6 mg/dL — ABNORMAL LOW (ref 8.9–10.3)
Chloride: 97 mmol/L — ABNORMAL LOW (ref 98–111)
Creatinine, Ser: 1.14 mg/dL (ref 0.61–1.24)
GFR, Estimated: >60 mL/min (ref >60)
Glucose, Bld: 163 mg/dL — ABNORMAL HIGH (ref 70–99)
Potassium: 4 mmol/L (ref 3.5–5.1)
Sodium: 131 mmol/L — ABNORMAL LOW (ref 135–145)

## 2023-12-18 LAB — CBC
HCT: 32.7 % — ABNORMAL LOW (ref 39.0–52.0)
Hemoglobin: 10.5 g/dL — ABNORMAL LOW (ref 13.0–17.0)
MCH: 30.4 pg (ref 26.0–34.0)
MCHC: 32.1 g/dL (ref 30.0–36.0)
MCV: 94.8 fL (ref 80.0–100.0)
Platelets: 450 10*3/uL — ABNORMAL HIGH (ref 150–400)
RBC: 3.45 MIL/uL — ABNORMAL LOW (ref 4.22–5.81)
RDW: 12.1 % (ref 11.5–15.5)
WBC: 13.1 10*3/uL — ABNORMAL HIGH (ref 4.0–10.5)
nRBC: 0 % (ref 0.0–0.2)

## 2023-12-18 NOTE — Inpatient Diabetes Management (Signed)
 Inpatient Diabetes Program Recommendations  AACE/ADA: New Consensus Statement on Inpatient Glycemic Control (2015)  Target Ranges:  Prepandial:   less than 140 mg/dL      Peak postprandial:   less than 180 mg/dL (1-2 hours)      Critically ill patients:  140 - 180 mg/dL    Latest Reference Range & Units 12/18/23 00:16 12/18/23 04:30 12/18/23 08:01 12/18/23 11:29  Glucose-Capillary 70 - 99 mg/dL 161 (H) 096 (H) 045 (H) 179 (H)  (H): Data is abnormally high    Home DM Meds: Levemir  25 units daily       Metformin  1000 mg BID       Novolog  4 units TID with meals  Current Orders: Semglee  10 units at HS      Novolog  Sensitive Correction Scale/ SSI (0-9 units) Q4H    Met w/ pt and wife at bedside this afternoon to ask about last hospital stay and discharge meds.  Pt and wife told me pt was discharged home on Levemir  and Novolog  on 05/24.  They told me they were not given the Levemir  Rx when they left and had to come back to the hospital to get the Rx in person--had trouble getting it filled and had to go to a 2nd pharmacy for the Levemir  to be filled.  Were given a box of 5 insulin  pens for home.  I discussed with pt and wife my concern that Levemir  is no longer being manufactured and they may at some point have trouble getting Levemir .  I told pt and wife that I have asked the Attending MD to switch pt to Lantus  at time of d/c.  Discussed with pt and wife that they can use up the Levemir  since Levemir  and Lantus  are very similar insulins, but at some point they will no longer be able to get Levemir .  Explained what Lantus  is and that his dose can be the same on Lantus .  Pt and wife stated understanding to use up the Levemir  and then switch to Lantus .  Discussed w/ pt and wife that if the Attending MD does not give them a Rx for Lantus  switch, then to please call PCP Dr. Hussain to get the Rx for Lantus .  Pt and wife agreeable and stated understanding.  Pt has CBG meter at home.  States he's not  really interested in using CGM at this point.      Discharge Recommendations: Long acting recommendations: Insulin  Glargine (LANTUS ) Solostar Pen Please switch to Lantus  at time of d/c--Levemir  no longer being manufactured    Use Adult Diabetes Insulin  Treatment Post Discharge order set.     --Will follow patient during hospitalization--  Langston Pippins RN, MSN, CDCES Diabetes Coordinator Inpatient Glycemic Control Team Team Pager: [phone] (8a-5p)

## 2023-12-18 NOTE — Plan of Care (Signed)
   Problem: Education: Goal: Ability to describe self-care measures that may prevent or decrease complications (Diabetes Survival Skills Education) will improve Outcome: Progressing Goal: Individualized Educational Video(s) Outcome: Progressing   Problem: Coping: Goal: Ability to adjust to condition or change in health will improve Outcome: Progressing

## 2023-12-18 NOTE — Progress Notes (Addendum)
 PROGRESS NOTE  Kevin Bradford KGM:[id number] DOB: [birth year] DOA: 12/15/2023 PCP: Jearldine Mina, MD   LOS: 2 days   Brief narrative:  Kevin Bradford is a 78 y.o. male with past medical history significant for poorly controlled diabetes mellitus, history of CVA, CKD stage II, and history of osteomyelitis status post left BKA presented to the hospital with fever and heel wound.  Had temperature of 102 F at home.  Family also noted to have foul-smelling discharge from the wound.  In the ED patient was afebrile.  Had transient tachycardia and mild hypotension.  Creatinine was elevated at 1.4.  WBC elevated at 16, 900.  Lactic acid was 1.1.  X-ray of the right foot without any acute osteomyelitis.   Blood cultures were collected, MRI was obtained patient was given Unasyn and linezolid and was at the hospital for further evaluation treatment.   Assessment/Plan: Principal Problem:   Diabetic infection of right foot (HCC) Active Problems:   CKD (chronic kidney disease) stage 2, GFR 60-89 ml/min   Type 2 diabetes mellitus with peripheral neuropathy (HCC)   History of stroke   Acute osteomyelitis of right calcaneus (HCC)   Cutaneous abscess of right foot  Diabetic right foot infection with abscess and calcaneal osteomyelitis Continue antibiotics with Unasyn and linezolid.  MRI of the foot on the right shows soft tissue ulceration with gas and signal intensity compatible with calcaneal osteomyelitis with fluid collection possibility of abscess and tenosynovitis.  Dr Julio Ohm from orthopedics has seen the patient and plan for amputation at this time.  Blood cultures negative in 2 day.  Will continue with antibiotics.  Patient feels better today.  Not much pain.  Mild leukocytosis.  Temperature max of 99.6 F.  Insulin -dependent DM  Review of latest hemoglobin A1c was 13.7 in May 2025.  Continue sliding scale insulin  Accu-Cheks.  Latest POC glucose of 136.  Patient was on Levemir  at home and will no longer  have it due to manufacture problem respiratory coordinator.  Will need to give prescription for Lantus  at discharge so that patient will continue to get his supply.   CKD stage II with mild AKI. Creatinine today at 1.1 from 1.3.  Will continue to monitor.  Received hydration.   Hx of CVA  - Hold Plavix  at this time.  DVT prophylaxis: SCDs Start: 12/16/23 0346   Disposition:  Uncertain, might need rehabilitation after surgery on 12/20/2023.  Status is: Inpatient Remains inpatient appropriate because: IV antibiotic, calcaneal abscess/osteomyelitis, need for surgical intervention.    Code Status:     Code Status: Full Code  Family Communication: Spoke with the patient's spouse at bedside on 12/16/2023  Consultants: Orthopedics Dr Julio Ohm  Procedures: None yet  Anti-infectives:  Linezolid and Unasyn IV  Anti-infectives (From admission, onward)    Start     Dose/Rate Route Frequency Ordered Stop   12/16/23 1200  linezolid (ZYVOX) tablet 600 mg       Placed in "And" Linked Group   600 mg Oral Every 12 hours 12/16/23 0347 12/22/23 2159   12/16/23 0600  Ampicillin-Sulbactam (UNASYN) 3 g in sodium chloride  0.9 % 100 mL IVPB        3 g 200 mL/hr over 30 Minutes Intravenous Every 6 hours 12/16/23 0348     12/16/23 0045  Ampicillin-Sulbactam (UNASYN) 3 g in sodium chloride  0.9 % 100 mL IVPB       Placed in "And" Linked Group   3 g 200 mL/hr over 30 Minutes Intravenous  Once  12/16/23 0036 12/16/23 0127   12/16/23 0045  linezolid (ZYVOX) IVPB 600 mg       Placed in "And" Linked Group   600 mg 300 mL/hr over 60 Minutes Intravenous  Once 12/16/23 0036 12/16/23 0319       Subjective: Today, patient was seen and examined at bedside.  Patient denies interval complaints or overt pain.  Denies any fever shortness of breath dyspnea chest pain.   Objective: Vitals:   12/18/23 0431 12/18/23 0802  BP: (!) 126/91 126/67  Pulse: 84 73  Resp: 15 (!) 22  Temp: 98.3 F (36.8 C) 98.6 F (37  C)  SpO2: 97% 99%    Intake/Output Summary (Last 24 hours) at 12/18/2023 0955 Last data filed at 12/17/2023 1300 Gross per 24 hour  Intake --  Output 150 ml  Net -150 ml   Filed Weights   12/15/23 2322 12/17/23 0411  Weight: 87.3 kg 82.2 kg   Body mass index is 23.27 kg/m.   Physical Exam:  GENERAL: Patient is alert awake and oriented. Not in obvious distress.  Elderly male, Communicative HENT: No scleral pallor or icterus. Pupils equally reactive to light. Oral mucosa is moist NECK: is supple, no gross swelling noted. CHEST: Clear to auscultation. No crackles or wheezes.  Diminished breath sounds bilaterally. CVS: S1 and S2 heard, no murmur. Regular rate and rhythm.  ABDOMEN: Soft, non-tender, bowel sounds are present. EXTREMITIES: Right heel with necrotic infection, Left below-knee amputation. CNS: Cranial nerves are intact.  SKIN: warm and dry, right ankle  infection.  Data Review: I have personally reviewed the following laboratory data and studies,  CBC: Recent Labs  Lab 12/15/23 2343 12/16/23 0345 12/17/23 0358 12/18/23 0408  WBC 16.9* 15.9* 13.8* 13.1*  NEUTROABS 14.6*  --   --   --   HGB 12.2* 10.4* 10.6* 10.5*  HCT 36.6* 31.7* 32.0* 32.7*  MCV 91.3 92.7 93.6 94.8  PLT 453* 393 407* 450*   Basic Metabolic Panel: Recent Labs  Lab 12/15/23 2343 12/16/23 0345 12/17/23 0358 12/18/23 0408  NA 132* 130* 133* 131*  K 4.6 4.1 4.1 4.0  CL 94* 96* 102 97*  CO2 19* 16* 22 22  GLUCOSE 191* 251* 179* 163*  BUN 18 18 15 12   CREATININE 1.45* 1.30* 1.07 1.14  CALCIUM  9.2 8.0* 8.5* 8.6*  MG  --   --  1.9  --   PHOS  --   --  3.7  --    Liver Function Tests: Recent Labs  Lab 12/15/23 2343  AST 40  ALT 26  ALKPHOS 117  BILITOT 1.3*  PROT 7.1  ALBUMIN 2.8*   No results for input(s): "LIPASE", "AMYLASE" in the last 168 hours. No results for input(s): "AMMONIA" in the last 168 hours. Cardiac Enzymes: No results for input(s): "CKTOTAL", "CKMB", "CKMBINDEX",  "TROPONINI" in the last 168 hours. BNP (last 3 results) No results for input(s): "BNP" in the last 8760 hours.  ProBNP (last 3 results) No results for input(s): "PROBNP" in the last 8760 hours.  CBG: Recent Labs  Lab 12/17/23 1640 12/17/23 2101 12/18/23 0016 12/18/23 0430 12/18/23 0801  GLUCAP 183* 210* 169* 160* 136*   Recent Results (from the past 240 hours)  MRSA Next Gen by PCR, Nasal     Status: None   Collection Time: 12/08/23 11:33 AM   Specimen: Nasal Mucosa; Nasal Swab  Result Value Ref Range Status   MRSA by PCR Next Gen NOT DETECTED NOT DETECTED Final  Comment: (NOTE) The GeneXpert MRSA Assay (FDA approved for NASAL specimens only), is one component of a comprehensive MRSA colonization surveillance program. It is not intended to diagnose MRSA infection nor to guide or monitor treatment for MRSA infections. Test performance is not FDA approved in patients less than 77 years old. Performed at [facility], [address], [place], [place] [address]   Culture, blood (routine x 2)     Status: None   Collection Time: 12/08/23 12:43 PM   Specimen: BLOOD LEFT ARM  Result Value Ref Range Status   Specimen Description BLOOD LEFT ARM  Final   Special Requests   Final    BOTTLES DRAWN AEROBIC AND ANAEROBIC Blood Culture adequate volume   Culture   Final    NO GROWTH 5 DAYS Performed at [facility], [address] [place], [place] [address]    Report Status 12/13/2023 FINAL  Final  Culture, blood (Routine x 2)     Status: None (Preliminary result)   Collection Time: 12/15/23 11:43 PM   Specimen: BLOOD RIGHT HAND  Result Value Ref Range Status   Specimen Description BLOOD RIGHT HAND  Final   Special Requests   Final    BOTTLES DRAWN AEROBIC AND ANAEROBIC Blood Culture results may not be optimal due to an inadequate volume of blood received in culture bottles   Culture   Final    NO GROWTH 2 DAYS Performed at [facility], [address]  [place], [place] [address]    Report Status PENDING  Incomplete  Culture, blood (Routine x 2)     Status: None (Preliminary result)   Collection Time: 12/15/23 11:43 PM   Specimen: BLOOD RIGHT HAND  Result Value Ref Range Status   Specimen Description BLOOD RIGHT HAND  Final   Special Requests   Final    BOTTLES DRAWN AEROBIC ONLY Blood Culture adequate volume   Culture   Final    NO GROWTH 2 DAYS Performed at [facility], [address] [place], [place] [address]    Report Status PENDING  Incomplete     Studies: No results found.     Juliya Magill, MD  [facility] 12/18/2023  If 7PM-7AM, please contact night-coverage

## 2023-12-18 NOTE — Progress Notes (Signed)
   12/18/23 1642  Spiritual Encounters  Type of Visit Initial  Care provided to: Family;Patient  Referral source Clinical staff  Reason for visit Advance directives  Spiritual Framework  Presenting Themes Rituals and practive  Values/beliefs Prayer and Hope  Community/Connection Family  Patient Stress Factors Health changes;Major life changes  Family Stress Factors Health changes;Major life changes  Interventions  Spiritual Care Interventions Made Established relationship of care and support;Reflective listening;Explored values/beliefs/practices/strengths;Encouragement;Prayer  Intervention Outcomes  Outcomes Awareness of support;Connection to spiritual care;Reduced fear   Chaplain  met with Pt and spouse to review the (AD) documentation. Pt. Was alert, engaged and expressed understanding of the information provided.  The spouse was presence and supportive throughout the conversation. The Pt stated their intent to complete the AD once a notary is available (12/19/23).  Close with a word of prayer and inform the Pt, and spouse that AD can be completed in the morning.

## 2023-12-18 NOTE — Progress Notes (Signed)
   12/18/23 1335  TOC Brief Assessment  Insurance and Status Reviewed  Patient has primary care physician Yes  Home environment has been reviewed home w/ spouse  Prior level of function: self  Prior/Current Home Services No current home services  Social Drivers of Health Review SDOH reviewed no interventions necessary  Readmission risk has been reviewed Yes  Transition of care needs transition of care needs identified, TOC will continue to follow    Note plan for OR on 6/4 Right BKA, We will continue to monitor patient advancement through interdisciplinary progression rounds post op.  If new patient transition needs arise, please place a TOC consult.

## 2023-12-19 DIAGNOSIS — E11628 Type 2 diabetes mellitus with other skin complications: Secondary | ICD-10-CM | POA: Diagnosis not present

## 2023-12-19 DIAGNOSIS — L089 Local infection of the skin and subcutaneous tissue, unspecified: Secondary | ICD-10-CM | POA: Diagnosis not present

## 2023-12-19 LAB — CBC
HCT: 34.4 % — ABNORMAL LOW (ref 39.0–52.0)
Hemoglobin: 11.1 g/dL — ABNORMAL LOW (ref 13.0–17.0)
MCH: 30.4 pg (ref 26.0–34.0)
MCHC: 32.3 g/dL (ref 30.0–36.0)
MCV: 94.2 fL (ref 80.0–100.0)
Platelets: 454 10*3/uL — ABNORMAL HIGH (ref 150–400)
RBC: 3.65 MIL/uL — ABNORMAL LOW (ref 4.22–5.81)
RDW: 12.1 % (ref 11.5–15.5)
WBC: 11.4 10*3/uL — ABNORMAL HIGH (ref 4.0–10.5)
nRBC: 0 % (ref 0.0–0.2)

## 2023-12-19 LAB — BASIC METABOLIC PANEL WITH GFR
Anion gap: 11 (ref 5–15)
BUN: 8 mg/dL (ref 8–23)
CO2: 25 mmol/L (ref 22–32)
Calcium: 8.5 mg/dL — ABNORMAL LOW (ref 8.9–10.3)
Chloride: 98 mmol/L (ref 98–111)
Creatinine, Ser: 1.1 mg/dL (ref 0.61–1.24)
GFR, Estimated: >60 mL/min (ref >60)
Glucose, Bld: 179 mg/dL — ABNORMAL HIGH (ref 70–99)
Potassium: 3.7 mmol/L (ref 3.5–5.1)
Sodium: 134 mmol/L — ABNORMAL LOW (ref 135–145)

## 2023-12-19 LAB — SURGICAL PCR SCREEN
MRSA, PCR: NEGATIVE
Staphylococcus aureus: NEGATIVE

## 2023-12-19 LAB — GLUCOSE, CAPILLARY
Glucose-Capillary: 169 mg/dL — ABNORMAL HIGH (ref 70–99)
Glucose-Capillary: 146 mg/dL — ABNORMAL HIGH (ref 70–99)
Glucose-Capillary: 152 mg/dL — ABNORMAL HIGH (ref 70–99)
Glucose-Capillary: 182 mg/dL — ABNORMAL HIGH (ref 70–99)
Glucose-Capillary: 156 mg/dL — ABNORMAL HIGH (ref 70–99)

## 2023-12-19 MED ORDER — TRANEXAMIC ACID-NACL 1000-0.7 MG/100ML-% IV SOLN
1000.0000 mg | INTRAVENOUS | Status: AC
Start: 2023-12-20 — End: 2023-12-21
  Administered 2023-12-20: 1000 mg via INTRAVENOUS
  Filled 2023-12-19: qty 100

## 2023-12-19 MED ORDER — INSULIN GLARGINE-YFGN 100 UNIT/ML ~~LOC~~ SOLN
15.0000 [IU] | Freq: Every day | SUBCUTANEOUS | Status: DC
  Administered 2023-12-19 – 2023-12-20 (×2): 15 [IU] via SUBCUTANEOUS
  Filled 2023-12-19 (×3): qty 0.15

## 2023-12-19 MED ORDER — MUPIROCIN 2 % EX OINT
1.0000 | TOPICAL_OINTMENT | Freq: Two times a day (BID) | CUTANEOUS | Status: AC
  Administered 2023-12-21 – 2023-12-24 (×5): 1 via NASAL
  Filled 2023-12-19 (×2): qty 22

## 2023-12-19 MED ORDER — TRANEXAMIC ACID 1000 MG/10ML IV SOLN
2000.0000 mg | INTRAVENOUS | Status: DC
  Filled 2023-12-19: qty 20

## 2023-12-19 MED ORDER — CEFAZOLIN SODIUM-DEXTROSE 2-4 GM/100ML-% IV SOLN
2.0000 g | INTRAVENOUS | Status: AC
  Administered 2023-12-20: 2 g via INTRAVENOUS
  Filled 2023-12-19: qty 100

## 2023-12-19 MED ORDER — INSULIN ASPART 100 UNIT/ML IJ SOLN
0.0000 [IU] | Freq: Three times a day (TID) | INTRAMUSCULAR | Status: DC
  Administered 2023-12-19 – 2023-12-20 (×4): 2 [IU] via SUBCUTANEOUS
  Administered 2023-12-21 (×2): 3 [IU] via SUBCUTANEOUS
  Administered 2023-12-21: 5 [IU] via SUBCUTANEOUS
  Administered 2023-12-21: 7 [IU] via SUBCUTANEOUS
  Administered 2023-12-21: 5 [IU] via SUBCUTANEOUS
  Administered 2023-12-22 (×2): 2 [IU] via SUBCUTANEOUS
  Administered 2023-12-23 – 2023-12-25 (×6): 1 [IU] via SUBCUTANEOUS

## 2023-12-19 NOTE — Progress Notes (Signed)
 This chaplain responded to unit page for notarizing the Pt. Advance Directive:  HCPOA and Living Will. The Pt. wife-[name] is visiting at the bedside.  The chaplain understands Advance Directive education was completed yesterday. The chaplain reviewed education and the Pt. answered clarifying questions. The Pt. is ready for notarizing his Advance Directive:  HCPOA and Living Will.  The Pt. is naming [name] as his healthcare agent. If this person is unwilling or unable to serve in this role the Pt. next choice is Actor.  The chaplain gave the Pt. the original AD along with two copies. The chaplain scanned the Pt. AD into the Pt. EMR.  This chaplain is available for F/U spiritual care as needed.  Chaplain Kathleene Papas [phone]

## 2023-12-19 NOTE — Progress Notes (Signed)
 PROGRESS NOTE  Kevin Bradford ZOX:[id number] DOB: 1945/08/09 DOA: 12/15/2023 PCP: Jearldine Mina, MD   LOS: 3 days   Brief narrative:  Kevin Bradford is a 78 y.o. male with past medical history significant for poorly controlled diabetes mellitus, history of CVA, CKD stage II, and history of osteomyelitis status post left BKA presented to the hospital with fever and heel wound.  Had temperature of 102 F at home.  Family also noted to have foul-smelling discharge from the wound.  In the ED, patient was afebrile.  Had transient tachycardia and mild hypotension.  Creatinine was elevated at 1.4.  WBC elevated at 16, 900.  Lactic acid was 1.1.  X-ray of the right foot without any acute osteomyelitis.   Blood cultures were collected, MRI was obtained patient was given Unasyn and linezolid and was at the hospital for further evaluation treatment.   Assessment/Plan: Principal Problem:   Diabetic infection of right foot (HCC) Active Problems:   CKD (chronic kidney disease) stage 2, GFR 60-89 ml/min   Type 2 diabetes mellitus with peripheral neuropathy (HCC)   History of stroke   Acute osteomyelitis of right calcaneus (HCC)   Cutaneous abscess of right foot  Diabetic right foot infection with abscess and calcaneal osteomyelitis Continue IV antibiotics with Unasyn and linezolid.  MRI of the foot on the right shows soft tissue ulceration with gas and signal intensity compatible with calcaneal osteomyelitis with fluid collection possibility of abscess and tenosynovitis.  Dr Julio Ohm from orthopedics has seen the patient and plan for amputation at this time.  Blood cultures negative in 3 days.    Leukocytosis present.  Temperature max of 99.1 F.  Insulin -dependent DM  Review of latest hemoglobin A1c was 13.7 in May 2025.  Continue sliding scale insulin  Accu-Cheks.  Latest POC glucose of 136.  Patient was on Levemir  at home and will no longer have it due to manufacture problem as per diabetic coordinator.  Will  need to give prescription for Lantus  at discharge so that patient will continue to get his supply.   CKD stage II with mild AKI. Creatinine today at 1.1 from 1.3.  Will continue to monitor.  Received hydration.   Hx of CVA  - Hold Plavix  at this time.  DVT prophylaxis: SCDs Start: 12/16/23 0346   Disposition:  Uncertain, might need rehabilitation after surgery on 12/20/2023.  Status is: Inpatient  Remains inpatient appropriate because: IV antibiotic, calcaneal abscess/osteomyelitis, need for surgical intervention.    Code Status:     Code Status: Full Code  Family Communication: Spoke with the patient's spouse at bedside on 12/19/2023  Consultants: Orthopedics Dr Julio Ohm  Procedures: None yet  Anti-infectives:  Linezolid and Unasyn IV  Anti-infectives (From admission, onward)    Start     Dose/Rate Route Frequency Ordered Stop   12/16/23 1200  linezolid (ZYVOX) tablet 600 mg       Placed in "And" Linked Group   600 mg Oral Every 12 hours 12/16/23 0347 12/22/23 2159   12/16/23 0600  Ampicillin-Sulbactam (UNASYN) 3 g in sodium chloride  0.9 % 100 mL IVPB        3 g 200 mL/hr over 30 Minutes Intravenous Every 6 hours 12/16/23 0348     12/16/23 0045  Ampicillin-Sulbactam (UNASYN) 3 g in sodium chloride  0.9 % 100 mL IVPB       Placed in "And" Linked Group   3 g 200 mL/hr over 30 Minutes Intravenous  Once 12/16/23 0036 12/16/23 0127   12/16/23 0045  linezolid (ZYVOX) IVPB 600 mg       Placed in "And" Linked Group   600 mg 300 mL/hr over 60 Minutes Intravenous  Once 12/16/23 0036 12/16/23 0319       Subjective: Today, patient was seen and examined at bedside.  Patient states that he he has no interval complaints.  Patient's spouse at bedside.  Denies any nausea vomiting fever chills or rigor.  Objective: Vitals:   12/19/23 0750 12/19/23 1123  BP: 123/60 129/70  Pulse: 77 90  Resp: 15 16  Temp: 98.6 F (37 C) 98.3 F (36.8 C)  SpO2: 97% 98%    Intake/Output  Summary (Last 24 hours) at 12/19/2023 1142 Last data filed at 12/19/2023 0624 Gross per 24 hour  Intake 1020 ml  Output 350 ml  Net 670 ml   Filed Weights   12/15/23 2322 12/17/23 0411 12/19/23 0346  Weight: 87.3 kg 82.2 kg 83.3 kg   Body mass index is 23.58 kg/m.   Physical Exam:  GENERAL: Patient is alert awake and oriented. Not in obvious distress.  Elderly male, Communicative HENT: No scleral pallor or icterus. Pupils equally reactive to light. Oral mucosa is moist NECK: is supple, no gross swelling noted. CHEST:  No crackles or wheezes.  Diminished breath sounds bilaterally. CVS: S1 and S2 heard, no murmur. Regular rate and rhythm.  ABDOMEN: Soft, non-tender, bowel sounds are present. EXTREMITIES: Right heel with necrotic infection, Left below-knee amputation. CNS: Cranial nerves are intact.  SKIN: warm and dry, right ankle  infection.  Data Review: I have personally reviewed the following laboratory data and studies,  CBC: Recent Labs  Lab 12/15/23 2343 12/16/23 0345 12/17/23 0358 12/18/23 0408 12/19/23 0327  WBC 16.9* 15.9* 13.8* 13.1* 11.4*  NEUTROABS 14.6*  --   --   --   --   HGB 12.2* 10.4* 10.6* 10.5* 11.1*  HCT 36.6* 31.7* 32.0* 32.7* 34.4*  MCV 91.3 92.7 93.6 94.8 94.2  PLT 453* 393 407* 450* 454*   Basic Metabolic Panel: Recent Labs  Lab 12/15/23 2343 12/16/23 0345 12/17/23 0358 12/18/23 0408 12/19/23 0327  NA 132* 130* 133* 131* 134*  K 4.6 4.1 4.1 4.0 3.7  CL 94* 96* 102 97* 98  CO2 19* 16* 22 22 25   GLUCOSE 191* 251* 179* 163* 179*  BUN 18 18 15 12 8   CREATININE 1.45* 1.30* 1.07 1.14 1.10  CALCIUM  9.2 8.0* 8.5* 8.6* 8.5*  MG  --   --  1.9  --   --   PHOS  --   --  3.7  --   --    Liver Function Tests: Recent Labs  Lab 12/15/23 2343  AST 40  ALT 26  ALKPHOS 117  BILITOT 1.3*  PROT 7.1  ALBUMIN 2.8*   No results for input(s): "LIPASE", "AMYLASE" in the last 168 hours. No results for input(s): "AMMONIA" in the last 168  hours. Cardiac Enzymes: No results for input(s): "CKTOTAL", "CKMB", "CKMBINDEX", "TROPONINI" in the last 168 hours. BNP (last 3 results) No results for input(s): "BNP" in the last 8760 hours.  ProBNP (last 3 results) No results for input(s): "PROBNP" in the last 8760 hours.  CBG: Recent Labs  Lab 12/18/23 1954 12/18/23 2349 12/19/23 0331 12/19/23 0806 12/19/23 1121  GLUCAP 200* 215* 169* 146* 152*   Recent Results (from the past 240 hours)  Culture, blood (Routine x 2)     Status: None (Preliminary result)   Collection Time: 12/15/23 11:43 PM   Specimen:  BLOOD RIGHT HAND  Result Value Ref Range Status   Specimen Description BLOOD RIGHT HAND  Final   Special Requests   Final    BOTTLES DRAWN AEROBIC AND ANAEROBIC Blood Culture results may not be optimal due to an inadequate volume of blood received in culture bottles   Culture   Final    NO GROWTH 3 DAYS Performed at [facility], [address] [place], [place] [address]    Report Status PENDING  Incomplete  Culture, blood (Routine x 2)     Status: None (Preliminary result)   Collection Time: 12/15/23 11:43 PM   Specimen: BLOOD RIGHT HAND  Result Value Ref Range Status   Specimen Description BLOOD RIGHT HAND  Final   Special Requests   Final    BOTTLES DRAWN AEROBIC ONLY Blood Culture adequate volume   Culture   Final    NO GROWTH 3 DAYS Performed at [facility], [address], [place], [place] [address]    Report Status PENDING  Incomplete     Studies: No results found.     Bobbyjo Marulanda, MD  [facility] 12/19/2023  If 7PM-7AM, please contact night-coverage

## 2023-12-19 NOTE — H&P (Signed)
 Kevin Bradford is an 78 y.o. male.   Chief Complaint: Abscess and osteomyelitis right hindfoot.  HPI: Kevin Bradford is a 78 y.o. male who presents with foul-smelling ulcer right heel.  Patient has recently been hospitalized.  Hemoglobin A1c greater than 13.   Past Medical History:  Diagnosis Date   Arthritis    "joint pain" (08/16/2017)   Below knee amputation (HCC)    Cellulitis and abscess of leg 10/18/2014   CKD (chronic kidney disease) stage 3, GFR 30-59 ml/min (HCC)    creatinine increases with antibiotics per pt, no known kidney disease per patient   Dehiscence of closure of skin, subsequent encounter    Diabetic foot ulcer (HCC)    left   Diabetic peripheral neuropathy (HCC)    GERD (gastroesophageal reflux disease)    12/26/2017- "not this year."    Osteomyelitis (HCC)    left transmetatarsal    Stroke (HCC)    Type II diabetes mellitus (HCC)     Past Surgical History:  Procedure Laterality Date   AMPUTATION Left 07/19/2017   Procedure: LEFT TRANSMETATARSAL AMPUTATION;  Surgeon: Timothy Ford, MD;  Location: [facility] OR;  Service: Orthopedics;  Laterality: Left;   AMPUTATION Left 08/16/2017   REVISION LEFT TRANSMETATARSAL AMPUTATION   AMPUTATION Left 11/22/2017   Procedure: LEFT BELOW KNEE AMPUTATION;  Surgeon: Timothy Ford, MD;  Location: [facility] OR;  Service: Orthopedics;  Laterality: Left;   CATARACT EXTRACTION W/ INTRAOCULAR LENS  IMPLANT, BILATERAL  ~ 2016   EYE SURGERY Bilateral    catarct   I & D EXTREMITY Left 10/17/2014   Procedure: IRRIGATION AND DEBRIDEMENT EXTREMITY LEFT FOOT;  Surgeon: Timothy Ford, MD;  Location: [facility] OR;  Service: Orthopedics;  Laterality: Left;   IR ANGIO INTRA EXTRACRAN SEL COM CAROTID INNOMINATE BILAT MOD SED  05/07/2018   IR ANGIO VERTEBRAL SEL SUBCLAVIAN INNOMINATE BILAT MOD SED  05/07/2018   STUMP REVISION Left 08/16/2017   Procedure: REVISION LEFT TRANSMETATARSAL AMPUTATION;  Surgeon: Timothy Ford, MD;  Location: [facility] OR;  Service: Orthopedics;   Laterality: Left;   STUMP REVISION Left 12/27/2017   Procedure: LEFT BELOW KNEE AMPUTATION REVISION;  Surgeon: Timothy Ford, MD;  Location: [facility] OR;  Service: Orthopedics;  Laterality: Left;   TOE AMPUTATION Right 2013   5th toe    Family History  Problem Relation Age of Onset   Dementia Mother    Social History:  reports that he has never smoked. He has never used smokeless tobacco. He reports current alcohol use of about 1.0 standard drink of alcohol per week. He reports that he does not use drugs.  Allergies:  Allergies  Allergen Reactions   Roxicodone  [Oxycodone ] Anaphylaxis, Shortness Of Breath and Other (See Comments)    Throat closed    Medications Prior to Admission  Medication Sig Dispense Refill   acetaminophen  (TYLENOL ) 500 MG tablet Take 500-1,000 mg by mouth every 6 (six) hours as needed for moderate pain (pain score 4-6), headache or fever.     Ascorbic Acid (VITAMIN C PO) Take 1 tablet by mouth daily.     Cholecalciferol (VITAMIN D3 PO) Take 1 capsule by mouth daily.     clopidogrel  (PLAVIX ) 75 MG tablet Take 1 tablet (75 mg total) by mouth daily. 30 tablet 6   diclofenac Sodium (VOLTAREN) 1 % GEL Apply 1 Application topically 4 (four) times daily as needed (pain).     insulin  aspart (NOVOLOG  FLEXPEN) 100 UNIT/ML FlexPen Inject 4 Units into the skin  3 (three) times daily with meals. 15 mL 11   insulin  detemir (LEVEMIR  FLEXTOUCH) 100 UNIT/ML FlexPen Inject 25 Units into the skin daily at 10 pm. 15 mL 2   MAGNESIUM  PO Take 1 tablet by mouth daily.     metFORMIN  (GLUCOPHAGE ) 500 MG tablet Take 1,000 mg by mouth 2 (two) times daily with a meal.     VITAMIN A PO Take 1 capsule by mouth daily.      Results for orders placed or performed during the hospital encounter of 12/15/23 (from the past 48 hours)  Glucose, capillary     Status: Abnormal   Collection Time: 12/17/23  4:40 PM  Result Value Ref Range   Glucose-Capillary 183 (H) 70 - 99 mg/dL    Comment: Glucose  reference range applies only to samples taken after fasting for at least 8 hours.  Glucose, capillary     Status: Abnormal   Collection Time: 12/17/23  9:01 PM  Result Value Ref Range   Glucose-Capillary 210 (H) 70 - 99 mg/dL    Comment: Glucose reference range applies only to samples taken after fasting for at least 8 hours.  Glucose, capillary     Status: Abnormal   Collection Time: 12/18/23 12:16 AM  Result Value Ref Range   Glucose-Capillary 169 (H) 70 - 99 mg/dL    Comment: Glucose reference range applies only to samples taken after fasting for at least 8 hours.  Basic metabolic panel     Status: Abnormal   Collection Time: 12/18/23  4:08 AM  Result Value Ref Range   Sodium 131 (L) 135 - 145 mmol/L   Potassium 4.0 3.5 - 5.1 mmol/L   Chloride 97 (L) 98 - 111 mmol/L   CO2 22 22 - 32 mmol/L   Glucose, Bld 163 (H) 70 - 99 mg/dL    Comment: Glucose reference range applies only to samples taken after fasting for at least 8 hours.   BUN 12 8 - 23 mg/dL   Creatinine, Ser 1.61 0.61 - 1.24 mg/dL   Calcium  8.6 (L) 8.9 - 10.3 mg/dL   GFR, Estimated >09 >60 mL/min    Comment: (NOTE) Calculated using the CKD-EPI Creatinine Equation (2021)    Anion gap 12 5 - 15    Comment: Performed at [facility], [address] [place], [place] [address]  CBC     Status: Abnormal   Collection Time: 12/18/23  4:08 AM  Result Value Ref Range   WBC 13.1 (H) 4.0 - 10.5 K/uL   RBC 3.45 (L) 4.22 - 5.81 MIL/uL   Hemoglobin 10.5 (L) 13.0 - 17.0 g/dL   HCT 81.1 (L) 91.4 - 78.2 %   MCV 94.8 80.0 - 100.0 fL   MCH 30.4 26.0 - 34.0 pg   MCHC 32.1 30.0 - 36.0 g/dL   RDW 95.6 21.3 - 08.6 %   Platelets 450 (H) 150 - 400 K/uL   nRBC 0.0 0.0 - 0.2 %    Comment: Performed at [facility], [address], [place], [place] [address]  Glucose, capillary     Status: Abnormal   Collection Time: 12/18/23  4:30 AM  Result Value Ref Range   Glucose-Capillary 160 (H) 70 - 99 mg/dL    Comment: Glucose  reference range applies only to samples taken after fasting for at least 8 hours.  Glucose, capillary     Status: Abnormal   Collection Time: 12/18/23  8:01 AM  Result Value Ref Range  Glucose-Capillary 136 (H) 70 - 99 mg/dL    Comment: Glucose reference range applies only to samples taken after fasting for at least 8 hours.  Glucose, capillary     Status: Abnormal   Collection Time: 12/18/23 11:29 AM  Result Value Ref Range   Glucose-Capillary 179 (H) 70 - 99 mg/dL    Comment: Glucose reference range applies only to samples taken after fasting for at least 8 hours.  Glucose, capillary     Status: Abnormal   Collection Time: 12/18/23  4:13 PM  Result Value Ref Range   Glucose-Capillary 167 (H) 70 - 99 mg/dL    Comment: Glucose reference range applies only to samples taken after fasting for at least 8 hours.  Glucose, capillary     Status: Abnormal   Collection Time: 12/18/23  7:54 PM  Result Value Ref Range   Glucose-Capillary 200 (H) 70 - 99 mg/dL    Comment: Glucose reference range applies only to samples taken after fasting for at least 8 hours.   Comment 1 Notify RN    Comment 2 Document in Chart   Glucose, capillary     Status: Abnormal   Collection Time: 12/18/23 11:49 PM  Result Value Ref Range   Glucose-Capillary 215 (H) 70 - 99 mg/dL    Comment: Glucose reference range applies only to samples taken after fasting for at least 8 hours.   Comment 1 Notify RN    Comment 2 Document in Chart   Basic metabolic panel     Status: Abnormal   Collection Time: 12/19/23  3:27 AM  Result Value Ref Range   Sodium 134 (L) 135 - 145 mmol/L   Potassium 3.7 3.5 - 5.1 mmol/L   Chloride 98 98 - 111 mmol/L   CO2 25 22 - 32 mmol/L   Glucose, Bld 179 (H) 70 - 99 mg/dL    Comment: Glucose reference range applies only to samples taken after fasting for at least 8 hours.   BUN 8 8 - 23 mg/dL   Creatinine, Ser 1.61 0.61 - 1.24 mg/dL   Calcium  8.5 (L) 8.9 - 10.3 mg/dL   GFR, Estimated >09 >60  mL/min    Comment: (NOTE) Calculated using the CKD-EPI Creatinine Equation (2021)    Anion gap 11 5 - 15    Comment: Performed at [facility], [address], [place], [place] [address]  CBC     Status: Abnormal   Collection Time: 12/19/23  3:27 AM  Result Value Ref Range   WBC 11.4 (H) 4.0 - 10.5 K/uL   RBC 3.65 (L) 4.22 - 5.81 MIL/uL   Hemoglobin 11.1 (L) 13.0 - 17.0 g/dL   HCT 81.1 (L) 91.4 - 78.2 %   MCV 94.2 80.0 - 100.0 fL   MCH 30.4 26.0 - 34.0 pg   MCHC 32.3 30.0 - 36.0 g/dL   RDW 95.6 21.3 - 08.6 %   Platelets 454 (H) 150 - 400 K/uL   nRBC 0.0 0.0 - 0.2 %    Comment: Performed at [facility], [address] [place], [place] [address]  Glucose, capillary     Status: Abnormal   Collection Time: 12/19/23  3:31 AM  Result Value Ref Range   Glucose-Capillary 169 (H) 70 - 99 mg/dL    Comment: Glucose reference range applies only to samples taken after fasting for at least 8 hours.   Comment 1 Document in Chart   Glucose, capillary     Status:  Abnormal   Collection Time: 12/19/23  8:06 AM  Result Value Ref Range   Glucose-Capillary 146 (H) 70 - 99 mg/dL    Comment: Glucose reference range applies only to samples taken after fasting for at least 8 hours.  Glucose, capillary     Status: Abnormal   Collection Time: 12/19/23 11:21 AM  Result Value Ref Range   Glucose-Capillary 152 (H) 70 - 99 mg/dL    Comment: Glucose reference range applies only to samples taken after fasting for at least 8 hours.   No results found.  Review of Systems  All other systems reviewed and are negative.   Blood pressure 129/70, pulse 90, temperature 98.3 F (36.8 C), temperature source Oral, resp. rate 16, height 6\' 2"  (1.88 m), weight 83.3 kg, SpO2 98%. Physical Exam  General: Alert, no acute distress Psychiatric: Patient is competent for consent with normal mood and affect Lymphatic: No axillary or cervical lymphadenopathy Cardiovascular: No pedal edema Respiratory: No  cyanosis, no use of accessory musculature GI: No organomegaly, abdomen is soft and non-tender     Neurologic: Patient does not have protective sensation bilateral lower extremities.     MUSCULOSKELETAL:    Skin: Examination patient has a necrotic ulcer on the right heel.  There is foul-smelling odor.  There is no ascending cellulitis.  Patient has a palpable popliteal pulse.   Review of the MRI scan shows extensive osteomyelitis of the calcaneus and a abscess in the retro-Achilles bursal area.   Most recent hemoglobin A1c 13.7.  White cell count 15.9.  Hemoglobin 10.4.  Albumin 2.8.   Review of the plain films shows destructive changes of the calcaneus as well as air in the soft tissue in the hindfoot.   Assessment/Plan Assessment: Uncontrolled type 2 diabetes with abscess and osteomyelitis right heel.  Recommended proceeding with a below-knee amputation. Patient does have a palpable popliteal pulse and this should heal well. Discussed the importance of glucose control. Plan to continue IV antibiotics and plan for transtibial amputation on Wednesday.    Rocky Cipro, PA-C 12/19/2023, 1:58 PM

## 2023-12-19 NOTE — Plan of Care (Signed)

## 2023-12-20 ENCOUNTER — Inpatient Hospital Stay (HOSPITAL_COMMUNITY): Admitting: Anesthesiology

## 2023-12-20 ENCOUNTER — Encounter (HOSPITAL_COMMUNITY)
Admission: EM | Disposition: A | Discharge status: Rehabilitation Facility | Payer: Self-pay | Source: Home / Self Care | Attending: Internal Medicine

## 2023-12-20 ENCOUNTER — Other Ambulatory Visit: Payer: Self-pay

## 2023-12-20 ENCOUNTER — Encounter (HOSPITAL_COMMUNITY): Payer: Self-pay | Admitting: Family Medicine

## 2023-12-20 DIAGNOSIS — L089 Local infection of the skin and subcutaneous tissue, unspecified: Secondary | ICD-10-CM | POA: Diagnosis not present

## 2023-12-20 DIAGNOSIS — E11628 Type 2 diabetes mellitus with other skin complications: Secondary | ICD-10-CM | POA: Diagnosis not present

## 2023-12-20 DIAGNOSIS — E1169 Type 2 diabetes mellitus with other specified complication: Secondary | ICD-10-CM | POA: Diagnosis not present

## 2023-12-20 DIAGNOSIS — N182 Chronic kidney disease, stage 2 (mild): Secondary | ICD-10-CM | POA: Diagnosis not present

## 2023-12-20 DIAGNOSIS — M869 Osteomyelitis, unspecified: Secondary | ICD-10-CM | POA: Diagnosis not present

## 2023-12-20 HISTORY — PX: AMPUTATION: SHX166

## 2023-12-20 LAB — CBC WITH DIFFERENTIAL/PLATELET
Abs Immature Granulocytes: 0.08 10*3/uL — ABNORMAL HIGH (ref 0.00–0.07)
Basophils Absolute: 0 10*3/uL (ref 0.0–0.1)
Basophils Relative: 0 %
Eosinophils Absolute: 0.1 10*3/uL (ref 0.0–0.5)
Eosinophils Relative: 1 %
HCT: 30 % — ABNORMAL LOW (ref 39.0–52.0)
Hemoglobin: 9.9 g/dL — ABNORMAL LOW (ref 13.0–17.0)
Immature Granulocytes: 1 %
Lymphocytes Relative: 8 %
Lymphs Abs: 0.9 10*3/uL (ref 0.7–4.0)
MCH: 30.5 pg (ref 26.0–34.0)
MCHC: 33 g/dL (ref 30.0–36.0)
MCV: 92.3 fL (ref 80.0–100.0)
Monocytes Absolute: 0.8 10*3/uL (ref 0.1–1.0)
Monocytes Relative: 7 %
Neutro Abs: 9.2 10*3/uL — ABNORMAL HIGH (ref 1.7–7.7)
Neutrophils Relative %: 83 %
Platelets: 415 10*3/uL — ABNORMAL HIGH (ref 150–400)
RBC: 3.25 MIL/uL — ABNORMAL LOW (ref 4.22–5.81)
RDW: 12.1 % (ref 11.5–15.5)
WBC: 11.1 10*3/uL — ABNORMAL HIGH (ref 4.0–10.5)
nRBC: 0 % (ref 0.0–0.2)

## 2023-12-20 LAB — COMPREHENSIVE METABOLIC PANEL WITH GFR
ALT: 24 U/L (ref 0–44)
AST: 28 U/L (ref 15–41)
Albumin: 1.9 g/dL — ABNORMAL LOW (ref 3.5–5.0)
Alkaline Phosphatase: 112 U/L (ref 38–126)
Anion gap: 11 (ref 5–15)
BUN: 8 mg/dL (ref 8–23)
CO2: 25 mmol/L (ref 22–32)
Calcium: 8.5 mg/dL — ABNORMAL LOW (ref 8.9–10.3)
Chloride: 97 mmol/L — ABNORMAL LOW (ref 98–111)
Creatinine, Ser: 1.07 mg/dL (ref 0.61–1.24)
GFR, Estimated: >60 mL/min (ref >60)
Glucose, Bld: 167 mg/dL — ABNORMAL HIGH (ref 70–99)
Potassium: 3.9 mmol/L (ref 3.5–5.1)
Sodium: 133 mmol/L — ABNORMAL LOW (ref 135–145)
Total Bilirubin: 1.1 mg/dL (ref 0.0–1.2)
Total Protein: 5.7 g/dL — ABNORMAL LOW (ref 6.5–8.1)

## 2023-12-20 LAB — GLUCOSE, CAPILLARY
Glucose-Capillary: 152 mg/dL — ABNORMAL HIGH (ref 70–99)
Glucose-Capillary: 138 mg/dL — ABNORMAL HIGH (ref 70–99)
Glucose-Capillary: 154 mg/dL — ABNORMAL HIGH (ref 70–99)
Glucose-Capillary: 179 mg/dL — ABNORMAL HIGH (ref 70–99)
Glucose-Capillary: 191 mg/dL — ABNORMAL HIGH (ref 70–99)
Glucose-Capillary: 291 mg/dL — ABNORMAL HIGH (ref 70–99)

## 2023-12-20 LAB — HEMOGLOBIN A1C
Hgb A1c MFr Bld: 12.4 % — ABNORMAL HIGH (ref 4.8–5.6)
Mean Plasma Glucose: 309.18 mg/dL

## 2023-12-20 LAB — PREALBUMIN: Prealbumin: <5 mg/dL — ABNORMAL LOW (ref 18–38)

## 2023-12-20 SURGERY — AMPUTATION BELOW KNEE
Anesthesia: General | Site: Knee | Laterality: Right

## 2023-12-20 MED ORDER — HYDRALAZINE HCL 20 MG/ML IJ SOLN: 5.0000 mg | INTRAMUSCULAR | Status: DC | PRN

## 2023-12-20 MED ORDER — MIDAZOLAM HCL 2 MG/2ML IJ SOLN: 1.0000 mg | Freq: Once | INTRAMUSCULAR | Status: AC

## 2023-12-20 MED ORDER — TRAMADOL HCL 50 MG PO TABS
50.0000 mg | ORAL_TABLET | Freq: Four times a day (QID) | ORAL | Status: DC
  Administered 2023-12-20 – 2023-12-25 (×18): 50 mg via ORAL
  Filled 2023-12-20 (×20): qty 1

## 2023-12-20 MED ORDER — ORAL CARE MOUTH RINSE: 15.0000 mL | Freq: Once | OROMUCOSAL | Status: AC

## 2023-12-20 MED ORDER — VASHE WOUND IRRIGATION OPTIME
TOPICAL | Status: DC | PRN
  Administered 2023-12-20: 34 [oz_av]

## 2023-12-20 MED ORDER — CHLORHEXIDINE GLUCONATE 0.12 % MT SOLN: 15.0000 mL | Freq: Once | OROMUCOSAL | Status: AC

## 2023-12-20 MED ORDER — FENTANYL CITRATE (PF) 100 MCG/2ML IJ SOLN
INTRAMUSCULAR | Status: AC
  Administered 2023-12-20: 50 ug via INTRAVENOUS
  Filled 2023-12-20: qty 2

## 2023-12-20 MED ORDER — PROPOFOL 10 MG/ML IV BOLUS
INTRAVENOUS | Status: DC | PRN
  Administered 2023-12-20: 140 mg via INTRAVENOUS

## 2023-12-20 MED ORDER — HYDROMORPHONE HCL 1 MG/ML IJ SOLN
0.5000 mg | INTRAMUSCULAR | Status: DC | PRN
  Administered 2023-12-20: 1 mg via INTRAVENOUS
  Administered 2023-12-21: 0.5 mg via INTRAVENOUS
  Administered 2023-12-21 – 2023-12-22 (×2): 1 mg via INTRAVENOUS
  Administered 2023-12-22 – 2023-12-23 (×4): 0.5 mg via INTRAVENOUS
  Filled 2023-12-20: qty 1
  Filled 2023-12-20: qty 0.5
  Filled 2023-12-20: qty 1
  Filled 2023-12-20 (×2): qty 0.5
  Filled 2023-12-20 (×2): qty 1
  Filled 2023-12-20: qty 0.5

## 2023-12-20 MED ORDER — ROPIVACAINE HCL 5 MG/ML IJ SOLN
INTRAMUSCULAR | Status: DC | PRN
Start: 2023-12-20 — End: 2023-12-20
  Administered 2023-12-20: 20 mL via PERINEURAL

## 2023-12-20 MED ORDER — BUPIVACAINE HCL (PF) 0.5 % IJ SOLN
INTRAMUSCULAR | Status: DC | PRN
Start: 2023-12-20 — End: 2023-12-20
  Administered 2023-12-20: 15 mL via PERINEURAL

## 2023-12-20 MED ORDER — SODIUM CHLORIDE 0.9 % IV SOLN: INTRAVENOUS | Status: AC

## 2023-12-20 MED ORDER — FENTANYL CITRATE (PF) 100 MCG/2ML IJ SOLN: 50.0000 ug | Freq: Once | INTRAMUSCULAR | Status: AC

## 2023-12-20 MED ORDER — METOPROLOL TARTRATE 5 MG/5ML IV SOLN: 2.0000 mg | INTRAVENOUS | Status: DC | PRN

## 2023-12-20 MED ORDER — 0.9 % SODIUM CHLORIDE (POUR BTL) OPTIME
TOPICAL | Status: DC | PRN
  Administered 2023-12-20: 1000 mL

## 2023-12-20 MED ORDER — LABETALOL HCL 5 MG/ML IV SOLN: 10.0000 mg | INTRAVENOUS | Status: DC | PRN

## 2023-12-20 MED ORDER — PANTOPRAZOLE SODIUM 40 MG PO TBEC
40.0000 mg | DELAYED_RELEASE_TABLET | Freq: Every day | ORAL | Status: DC
  Administered 2023-12-20 – 2023-12-25 (×6): 40 mg via ORAL
  Filled 2023-12-20 (×6): qty 1

## 2023-12-20 MED ORDER — MIDAZOLAM HCL 2 MG/2ML IJ SOLN
INTRAMUSCULAR | Status: AC
Start: 2023-12-20 — End: ?
  Filled 2023-12-20: qty 2

## 2023-12-20 MED ORDER — POTASSIUM CHLORIDE CRYS ER 20 MEQ PO TBCR: 20.0000 meq | EXTENDED_RELEASE_TABLET | Freq: Every day | ORAL | Status: DC | PRN

## 2023-12-20 MED ORDER — PHENOL 1.4 % MT LIQD: 1.0000 | OROMUCOSAL | Status: DC | PRN

## 2023-12-20 MED ORDER — LIDOCAINE 2% (20 MG/ML) 5 ML SYRINGE
INTRAMUSCULAR | Status: AC
  Filled 2023-12-20: qty 5

## 2023-12-20 MED ORDER — CHLORHEXIDINE GLUCONATE 0.12 % MT SOLN
OROMUCOSAL | Status: AC
  Administered 2023-12-20: 15 mL via OROMUCOSAL
  Filled 2023-12-20: qty 15

## 2023-12-20 MED ORDER — MIDAZOLAM HCL 2 MG/2ML IJ SOLN
INTRAMUSCULAR | Status: AC
  Administered 2023-12-20: 1 mg via INTRAVENOUS
  Filled 2023-12-20: qty 2

## 2023-12-20 MED ORDER — PHENYLEPHRINE HCL-NACL 20-0.9 MG/250ML-% IV SOLN
INTRAVENOUS | Status: DC | PRN
  Administered 2023-12-20: 50 ug/min via INTRAVENOUS

## 2023-12-20 MED ORDER — CHLORHEXIDINE GLUCONATE 4 % EX SOLN
CUTANEOUS | Status: AC
  Filled 2023-12-20: qty 30

## 2023-12-20 MED ORDER — PROPOFOL 10 MG/ML IV BOLUS
INTRAVENOUS | Status: AC
  Filled 2023-12-20: qty 20

## 2023-12-20 MED ORDER — CEFAZOLIN SODIUM-DEXTROSE 2-4 GM/100ML-% IV SOLN
INTRAVENOUS | Status: AC
  Filled 2023-12-20: qty 100

## 2023-12-20 MED ORDER — GUAIFENESIN-DM 100-10 MG/5ML PO SYRP: 15.0000 mL | ORAL_SOLUTION | ORAL | Status: DC | PRN

## 2023-12-20 MED ORDER — MAGNESIUM SULFATE 2 GM/50ML IV SOLN: 2.0000 g | Freq: Every day | INTRAVENOUS | Status: DC | PRN

## 2023-12-20 MED ORDER — LACTATED RINGERS IV SOLN: INTRAVENOUS | Status: DC

## 2023-12-20 MED ORDER — ALUM & MAG HYDROXIDE-SIMETH 200-200-20 MG/5ML PO SUSP: 15.0000 mL | ORAL | Status: DC | PRN

## 2023-12-20 MED ORDER — BUPIVACAINE LIPOSOME 1.3 % IJ SUSP
INTRAMUSCULAR | Status: DC | PRN
Start: 2023-12-20 — End: 2023-12-20
  Administered 2023-12-20: 10 mL via PERINEURAL

## 2023-12-20 MED ORDER — DEXAMETHASONE SODIUM PHOSPHATE 10 MG/ML IJ SOLN
INTRAMUSCULAR | Status: AC
  Filled 2023-12-20: qty 1

## 2023-12-20 MED ORDER — DEXAMETHASONE SODIUM PHOSPHATE 10 MG/ML IJ SOLN
INTRAMUSCULAR | Status: DC | PRN
  Administered 2023-12-20: 5 mg via INTRAVENOUS

## 2023-12-20 MED ORDER — DOCUSATE SODIUM 100 MG PO CAPS
100.0000 mg | ORAL_CAPSULE | Freq: Every day | ORAL | Status: DC
  Administered 2023-12-21 – 2023-12-23 (×3): 100 mg via ORAL
  Filled 2023-12-20 (×5): qty 1

## 2023-12-20 MED ORDER — ACETAMINOPHEN 325 MG PO TABS
325.0000 mg | ORAL_TABLET | Freq: Four times a day (QID) | ORAL | Status: DC | PRN
  Administered 2023-12-22 – 2023-12-23 (×2): 650 mg via ORAL
  Filled 2023-12-20 (×3): qty 2

## 2023-12-20 MED ORDER — ONDANSETRON HCL 4 MG/2ML IJ SOLN
INTRAMUSCULAR | Status: DC | PRN
  Administered 2023-12-20: 4 mg via INTRAVENOUS

## 2023-12-20 MED ORDER — FENTANYL CITRATE (PF) 250 MCG/5ML IJ SOLN
INTRAMUSCULAR | Status: AC
  Filled 2023-12-20: qty 5

## 2023-12-20 MED ORDER — ONDANSETRON HCL 4 MG/2ML IJ SOLN
INTRAMUSCULAR | Status: AC
  Filled 2023-12-20: qty 2

## 2023-12-20 SURGICAL SUPPLY — 35 items
BAG COUNTER SPONGE SURGICOUNT (BAG) IMPLANT
BLADE SAW RECIP 87.9 MT (BLADE) ×1 IMPLANT
BLADE SURG 21 STRL SS (BLADE) ×1 IMPLANT
BNDG COHESIVE 6X5 TAN ST LF (GAUZE/BANDAGES/DRESSINGS) IMPLANT
CANISTER WOUND CARE 500ML ATS (WOUND CARE) ×1 IMPLANT
CLEANSER WND VASHE 34 (WOUND CARE) IMPLANT
COVER SURGICAL LIGHT HANDLE (MISCELLANEOUS) ×1 IMPLANT
CUFF TRNQT CYL 34X4.125X (TOURNIQUET CUFF) ×1 IMPLANT
DRAPE INCISE IOBAN 66X45 STRL (DRAPES) ×1 IMPLANT
DRAPE U-SHAPE 47X51 STRL (DRAPES) ×1 IMPLANT
DRESSING PREVENA PLUS CUSTOM (GAUZE/BANDAGES/DRESSINGS) ×1 IMPLANT
DRSG VAC PEEL AND PLACE LRG (GAUZE/BANDAGES/DRESSINGS) IMPLANT
DURAPREP 26ML APPLICATOR (WOUND CARE) ×1 IMPLANT
ELECTRODE REM PT RTRN 9FT ADLT (ELECTROSURGICAL) ×1 IMPLANT
GLOVE BIOGEL PI IND STRL 9 (GLOVE) ×1 IMPLANT
GLOVE SURG ORTHO 9.0 STRL STRW (GLOVE) ×1 IMPLANT
GOWN STRL REUS W/ TWL XL LVL3 (GOWN DISPOSABLE) ×2 IMPLANT
GRAFT SKIN WND MICRO 38 (Tissue) IMPLANT
KIT BASIN OR (CUSTOM PROCEDURE TRAY) ×1 IMPLANT
KIT TURNOVER KIT B (KITS) ×1 IMPLANT
MANIFOLD NEPTUNE II (INSTRUMENTS) ×1 IMPLANT
NS IRRIG 1000ML POUR BTL (IV SOLUTION) ×1 IMPLANT
PACK ORTHO EXTREMITY (CUSTOM PROCEDURE TRAY) ×1 IMPLANT
PAD ARMBOARD POSITIONER FOAM (MISCELLANEOUS) ×1 IMPLANT
PENCIL BUTTON HOLSTER BLD 10FT (ELECTRODE) IMPLANT
PREVENA RESTOR ARTHOFORM 46X30 (CANNISTER) ×1 IMPLANT
SPONGE T-LAP 18X18 ~~LOC~~+RFID (SPONGE) IMPLANT
STAPLER SKIN PROX 35W (STAPLE) IMPLANT
STOCKINETTE IMPERVIOUS LG (DRAPES) ×1 IMPLANT
SUT ETHILON 2 0 PSLX (SUTURE) IMPLANT
SUT SILK 2-0 18XBRD TIE 12 (SUTURE) ×1 IMPLANT
SUT VIC AB 1 CTX 27 (SUTURE) ×2 IMPLANT
TOWEL GREEN STERILE (TOWEL DISPOSABLE) ×1 IMPLANT
TUBE CONNECTING 12X1/4 (SUCTIONS) ×1 IMPLANT
YANKAUER SUCT BULB TIP NO VENT (SUCTIONS) ×1 IMPLANT

## 2023-12-20 NOTE — Progress Notes (Signed)
 Patient taken down to short stay/preop at this time via transporter.  No s/s of distress noted.

## 2023-12-20 NOTE — Plan of Care (Signed)
  Problem: Education: Goal: Ability to describe self-care measures that may prevent or decrease complications (Diabetes Survival Skills Education) will improve Outcome: Not Progressing   Problem: Coping: Goal: Ability to adjust to condition or change in health will improve Outcome: Not Progressing   Problem: Fluid Volume: Goal: Ability to maintain a balanced intake and output will improve Outcome: Not Progressing   Problem: Health Behavior/Discharge Planning: Goal: Ability to identify and utilize available resources and services will improve Outcome: Not Progressing Goal: Ability to manage health-related needs will improve Outcome: Not Progressing   Problem: Metabolic: Goal: Ability to maintain appropriate glucose levels will improve Outcome: Not Progressing   Problem: Nutritional: Goal: Maintenance of adequate nutrition will improve Outcome: Not Progressing Goal: Progress toward achieving an optimal weight will improve Outcome: Not Progressing   Problem: Skin Integrity: Goal: Risk for impaired skin integrity will decrease Outcome: Not Progressing   Problem: Tissue Perfusion: Goal: Adequacy of tissue perfusion will improve Outcome: Not Progressing   Problem: Education: Goal: Knowledge of General Education information will improve Description: Including pain rating scale, medication(s)/side effects and non-pharmacologic comfort measures Outcome: Not Progressing   Problem: Health Behavior/Discharge Planning: Goal: Ability to manage health-related needs will improve Outcome: Not Progressing   Problem: Clinical Measurements: Goal: Ability to maintain clinical measurements within normal limits will improve Outcome: Not Progressing Goal: Will remain free from infection Outcome: Not Progressing Goal: Diagnostic test results will improve Outcome: Not Progressing Goal: Respiratory complications will improve Outcome: Not Progressing Goal: Cardiovascular complication will  be avoided Outcome: Not Progressing   Problem: Activity: Goal: Risk for activity intolerance will decrease Outcome: Not Progressing   Problem: Nutrition: Goal: Adequate nutrition will be maintained Outcome: Not Progressing   Problem: Coping: Goal: Level of anxiety will decrease Outcome: Not Progressing   Problem: Elimination: Goal: Will not experience complications related to bowel motility Outcome: Not Progressing Goal: Will not experience complications related to urinary retention Outcome: Not Progressing   Problem: Pain Managment: Goal: General experience of comfort will improve and/or be controlled Outcome: Not Progressing   Problem: Safety: Goal: Ability to remain free from injury will improve Outcome: Not Progressing   Problem: Skin Integrity: Goal: Risk for impaired skin integrity will decrease Outcome: Not Progressing   Problem: Education: Goal: Knowledge of the prescribed therapeutic regimen will improve Outcome: Not Progressing Goal: Ability to verbalize activity precautions or restrictions will improve Outcome: Not Progressing Goal: Understanding of discharge needs will improve Outcome: Not Progressing   Problem: Activity: Goal: Ability to perform//tolerate increased activity and mobilize with assistive devices will improve Outcome: Not Progressing   Problem: Clinical Measurements: Goal: Postoperative complications will be avoided or minimized Outcome: Not Progressing   Problem: Self-Care: Goal: Ability to meet self-care needs will improve Outcome: Not Progressing   Problem: Self-Concept: Goal: Ability to maintain and perform role responsibilities to the fullest extent possible will improve Outcome: Not Progressing   Problem: Pain Management: Goal: Pain level will decrease with appropriate interventions Outcome: Not Progressing   Problem: Skin Integrity: Goal: Demonstration of wound healing without infection will improve Outcome: Not  Progressing

## 2023-12-20 NOTE — Interval H&P Note (Signed)
 History and Physical Interval Note:  12/20/2023 6:45 AM  Kevin Bradford  has presented today for surgery, with the diagnosis of Osteomyelitis Right Foot.  The various methods of treatment have been discussed with the patient and family. After consideration of risks, benefits and other options for treatment, the patient has consented to  Procedure(s): AMPUTATION BELOW KNEE (Right) as a surgical intervention.  The patient's history has been reviewed, patient examined, no change in status, stable for surgery.  I have reviewed the patient's chart and labs.  Questions were answered to the patient's satisfaction.     Starasia Sinko V Makaylin Carlo

## 2023-12-20 NOTE — Anesthesia Preprocedure Evaluation (Addendum)
 Anesthesia Evaluation  Patient identified by MRN, date of birth, ID band Patient awake    Reviewed: Allergy & Precautions, NPO status , Patient's Chart, lab work & pertinent test results  Airway Mallampati: I  TM Distance: >3 FB Neck ROM: Full    Dental  (+) Teeth Intact, Dental Advisory Given   Pulmonary neg pulmonary ROS   breath sounds clear to auscultation       Cardiovascular negative cardio ROS  Rhythm:Regular Rate:Normal  Echo:  Left ventricle: The cavity size was normal. Wall thickness was    increased in a pattern of moderate LVH. Systolic function was    normal. The estimated ejection fraction was in the range of 60%    to 65%. Wall motion was normal; there were no regional wall    motion abnormalities. Doppler parameters are consistent with    abnormal left ventricular relaxation (grade 1 diastolic    dysfunction). The E/e&' ratio is between 8-15, suggesting    indeterminate LV filling pressure.  - Left atrium: The atrium was mildly dilated.  - Systemic veins: The IVC measures <2.1 cm, but does not collapse    >50%, suggesting an elevated RA pressure of 8 mmHg.     Neuro/Psych  Neuromuscular disease CVA  negative psych ROS   GI/Hepatic Neg liver ROS,GERD  ,,  Endo/Other  diabetes    Renal/GU Renal disease     Musculoskeletal  (+) Arthritis ,    Abdominal   Peds  Hematology  (+) Blood dyscrasia, anemia   Anesthesia Other Findings   Reproductive/Obstetrics                             Anesthesia Physical Anesthesia Plan  ASA: 3  Anesthesia Plan: General   Post-op Pain Management: Regional block*   Induction: Intravenous  PONV Risk Score and Plan: 3 and Ondansetron  and Treatment may vary due to age or medical condition  Airway Management Planned: LMA  Additional Equipment: None  Intra-op Plan:   Post-operative Plan: Extubation in OR  Informed Consent: I have  reviewed the patients History and Physical, chart, labs and discussed the procedure including the risks, benefits and alternatives for the proposed anesthesia with the patient or authorized representative who has indicated his/her understanding and acceptance.     Dental advisory given  Plan Discussed with: CRNA  Anesthesia Plan Comments:        Anesthesia Quick Evaluation

## 2023-12-20 NOTE — Op Note (Signed)
 12/20/2023  1:48 PM  PATIENT:  Kevin Bradford    PRE-OPERATIVE DIAGNOSIS:  Osteomyelitis Right Foot  POST-OPERATIVE DIAGNOSIS:  Same  PROCEDURE:  AMPUTATION RIGHT BELOW KNEE Application of Kerecis micro graft 38 cm. Application of Peel and Place x2 VAC dressings Application of Vive Wear stump shrinker and the [employer] limb protector  SURGEON:  Timothy Ford, MD  ANESTHESIA:   General  PREOPERATIVE INDICATIONS:  [name] is a  78 y.o. male with a diagnosis of Osteomyelitis Right Foot who failed conservative measures and elected for surgical management.    The risks benefits and alternatives were discussed with the patient preoperatively including but not limited to the risks of infection, bleeding, nerve injury, cardiopulmonary complications, the need for revision surgery, among others, and the patient was willing to proceed.  OPERATIVE IMPLANTS:   Implant Name Type Inv. Item Serial No. Manufacturer Lot No. LRB No. Used Action  GRAFT SKIN WND MICRO 38 - [id number] Tissue GRAFT SKIN WND MICRO 38  [employer] [id number] Right 1 Implanted     OPERATIVE FINDINGS: muscle had good bleeding, color and contractility  OPERATIVE PROCEDURE: Patient was brought to the operating room after undergoing a regional anesthetic.  After adequate levels anesthesia were obtained a thigh tourniquet was placed and the lower extremity was prepped using DuraPrep draped into a sterile field. The foot was draped out of the sterile field with impervious stockinette.  A timeout was called and the tourniquet inflated.  A transverse skin incision was made 12 cm distal to the tibial tubercle, the incision curved proximally, and a large posterior flap was created.  The tibia was transected just proximal to the skin incision and beveled anteriorly.  The fibula was transected just proximal to the tibial incision.  The sciatic nerve was pulled cut and allowed to retract.  The vascular bundles were suture ligated with  2-0 silk.  The tourniquet was deflated and hemostasis obtained.  The wound was irrigated with Vashe.   The Kerecis micro powder 38 cm was applied to the open wound that has a 200 cm surface area.    The deep and superficial fascial layers were closed using #1 Vicryl.  The skin was closed using staples.    The Prevena Peel and Place  dressing was applied.  Yvone Herd was used to secure the sponges and the circumferential compression was secured to the skin with Dermatac.  This was connected to the wound VAC pump and had a good suction fit this was covered with a stump shrinker and a limb protector.  Patient was taken to the PACU in stable condition.   DISCHARGE PLANNING:  Antibiotic duration: 24-hour antibiotics  Weightbearing: Nonweightbearing on the operative extremity  Pain medication: Opioid pathway  Dressing care/ Wound VAC: Continue wound VAC with the Prevena plus pump at discharge for 1 week  Ambulatory devices: Walker or kneeling scooter  Discharge to: Discharge planning based on recommendations per physical therapy  Follow-up: In the office 1 week after discharge.

## 2023-12-20 NOTE — Anesthesia Procedure Notes (Signed)
 Anesthesia Regional Block: Popliteal block   Pre-Anesthetic Checklist: , timeout performed,  Correct Patient, Correct Site, Correct Laterality,  Correct Procedure, Correct Position, site marked,  Risks and benefits discussed,  Surgical consent,  Pre-op evaluation,  At surgeon's request and post-op pain management  Laterality: Right  Prep: chloraprep       Needles:  Injection technique: Single-shot  Needle Type: Echogenic Stimulator Needle     Needle Length: 9cm  Needle Gauge: 21     Additional Needles:   Procedures:,,,, ultrasound used (permanent image in chart),,    Narrative:  Start time: 12/20/2023 12:25 PM End time: 12/20/2023 12:30 PM Injection made incrementally with aspirations every 5 mL.  Performed by: Personally  Anesthesiologist: Willian Harrow, MD  Additional Notes: Discussed risks and benefits of the nerve block in detail, including but not limited vascular injury, permanent nerve damage and infection.   Patient tolerated the procedure well. Local anesthetic introduced in an incremental fashion under minimal resistance after negative aspirations. No paresthesias were elicited. After completion of the procedure, no acute issues were identified and patient continued to be monitored by RN.

## 2023-12-20 NOTE — Progress Notes (Addendum)
 PROGRESS NOTE  [name]:[id number] DOB: Feb 20, 1946 DOA: 12/15/2023 PCP: Jearldine Mina, MD   LOS: 4 days   Brief narrative:  Kevin Bradford is a 78 y.o. male with past medical history significant for poorly controlled diabetes mellitus, history of CVA, CKD stage II, and history of osteomyelitis status post left BKA presented to the hospital with fever and heel wound.  Had temperature of 102 F at home.  Family also noted to have foul-smelling discharge from the wound.  In the ED, patient was afebrile.  Had transient tachycardia and mild hypotension.  Creatinine was elevated at 1.4.  WBC elevated at 16, 900.  Lactic acid was 1.1.  X-ray of the right foot without any acute osteomyelitis.   Blood cultures were collected, MRI was obtained patient was given Unasyn and linezolid and was at the hospital for further evaluation treatment.   Assessment/Plan: Principal Problem:   Diabetic infection of right foot (HCC) Active Problems:   CKD (chronic kidney disease) stage 2, GFR 60-89 ml/min   Type 2 diabetes mellitus with peripheral neuropathy (HCC)   History of stroke   Acute osteomyelitis of right calcaneus (HCC)   Cutaneous abscess of right foot  Diabetic right foot infection with abscess and calcaneal osteomyelitis Continue IV antibiotics with Unasyn and linezolid.  MRI of the foot on the right shows soft tissue ulceration with gas and signal intensity compatible with calcaneal osteomyelitis with fluid collection possibility of abscess and tenosynovitis.  Dr Julio Ohm from orthopedics has seen the patient and plan for amputation today. Blood cultures negative in 3 days.      Insulin -dependent DM  Review of latest hemoglobin A1c was 13.7 in May 2025.  Continue sliding scale insulin  Accu-Cheks.  Latest POC glucose of 136.  Patient was on Levemir  at home and will no longer have it due to manufacture problem as per diabetic coordinator.  Will need to give prescription for Lantus  at discharge so that  patient will continue to get his supply.   CKD stage II with mild AKI. Creatinine today at 1.0 from 1.4.  Will continue to monitor.  Received hydration. Baseline creatinine of 1.07   Hx of CVA  - Hold Plavix  at this time.  DVT prophylaxis: SCDs Start: 12/16/23 0346   Disposition:  Uncertain, might need rehabilitation after surgery on 12/20/2023.  Status is: Inpatient  Remains inpatient appropriate because: IV antibiotic, calcaneal abscess/osteomyelitis, need for surgical intervention.    Code Status:     Code Status: Full Code  Family Communication: Spoke with the patient's spouse at bedside on 12/20/2023  Consultants: Orthopedics Dr Julio Ohm  Procedures: None yet  Anti-infectives:  Linezolid and Unasyn IV  Anti-infectives (From admission, onward)    Start     Dose/Rate Route Frequency Ordered Stop   12/20/23 1000  ceFAZolin  (ANCEF ) IVPB 2g/100 mL premix        2 g 200 mL/hr over 30 Minutes Intravenous On call to O.R. 12/19/23 2322 12/21/23 0559   12/16/23 1200  [MAR Hold]  linezolid (ZYVOX) tablet 600 mg        (MAR Hold since Wed 12/20/2023 at 1104.Hold Reason: Transfer to a Procedural area)  Placed in "And" Linked Group   600 mg Oral Every 12 hours 12/16/23 0347 12/22/23 2159   12/16/23 0600  [MAR Hold]  Ampicillin-Sulbactam (UNASYN) 3 g in sodium chloride  0.9 % 100 mL IVPB        (MAR Hold since Wed 12/20/2023 at 1104.Hold Reason: Transfer to a Procedural area)  3 g 200 mL/hr over 30 Minutes Intravenous Every 6 hours 12/16/23 0348     12/16/23 0045  Ampicillin-Sulbactam (UNASYN) 3 g in sodium chloride  0.9 % 100 mL IVPB       Placed in "And" Linked Group   3 g 200 mL/hr over 30 Minutes Intravenous  Once 12/16/23 0036 12/16/23 0127   12/16/23 0045  linezolid (ZYVOX) IVPB 600 mg       Placed in "And" Linked Group   600 mg 300 mL/hr over 60 Minutes Intravenous  Once 12/16/23 0036 12/16/23 0319       Subjective: Today, patient was seen and examined at bedside.  Patient  denies interval complaints.  Awaiting for surgery.    Objective: Vitals:   12/20/23 1235 12/20/23 1240  BP: 113/84 124/66  Pulse: 79 87  Resp: 18 16  Temp:    SpO2: 98% 98%    Intake/Output Summary (Last 24 hours) at 12/20/2023 1319 Last data filed at 12/20/2023 1300 Gross per 24 hour  Intake 720.06 ml  Output --  Net 720.06 ml   Filed Weights   12/19/23 0346 12/20/23 0523 12/20/23 1107  Weight: 83.3 kg 82.7 kg 82.7 kg   Body mass index is 23.41 kg/m.   Physical Exam:  GENERAL: Patient is alert awake and oriented. Not in obvious distress.  Elderly male, Communicative HENT: No scleral pallor or icterus. Pupils equally reactive to light. Oral mucosa is moist NECK: is supple, no gross swelling noted. CHEST:  No crackles or wheezes.  Diminished breath sounds bilaterally. CVS: S1 and S2 heard, no murmur. Regular rate and rhythm.  ABDOMEN: Soft, non-tender, bowel sounds are present. EXTREMITIES: Right heel with necrotic infection with dressing, Left below-knee amputation. CNS: Cranial nerves are intact.  SKIN: warm and dry, right ankle dressing  Data Review: I have personally reviewed the following laboratory data and studies,  CBC: Recent Labs  Lab 12/15/23 2343 12/16/23 0345 12/17/23 0358 12/18/23 0408 12/19/23 0327 12/20/23 0309  WBC 16.9* 15.9* 13.8* 13.1* 11.4* 11.1*  NEUTROABS 14.6*  --   --   --   --  9.2*  HGB 12.2* 10.4* 10.6* 10.5* 11.1* 9.9*  HCT 36.6* 31.7* 32.0* 32.7* 34.4* 30.0*  MCV 91.3 92.7 93.6 94.8 94.2 92.3  PLT 453* 393 407* 450* 454* 415*   Basic Metabolic Panel: Recent Labs  Lab 12/16/23 0345 12/17/23 0358 12/18/23 0408 12/19/23 0327 12/20/23 0309  NA 130* 133* 131* 134* 133*  K 4.1 4.1 4.0 3.7 3.9  CL 96* 102 97* 98 97*  CO2 16* 22 22 25 25   GLUCOSE 251* 179* 163* 179* 167*  BUN 18 15 12 8 8   CREATININE 1.30* 1.07 1.14 1.10 1.07  CALCIUM  8.0* 8.5* 8.6* 8.5* 8.5*  MG  --  1.9  --   --   --   PHOS  --  3.7  --   --   --    Liver  Function Tests: Recent Labs  Lab 12/15/23 2343 12/20/23 0309  AST 40 28  ALT 26 24  ALKPHOS 117 112  BILITOT 1.3* 1.1  PROT 7.1 5.7*  ALBUMIN 2.8* 1.9*   No results for input(s): "LIPASE", "AMYLASE" in the last 168 hours. No results for input(s): "AMMONIA" in the last 168 hours. Cardiac Enzymes: No results for input(s): "CKTOTAL", "CKMB", "CKMBINDEX", "TROPONINI" in the last 168 hours. BNP (last 3 results) No results for input(s): "BNP" in the last 8760 hours.  ProBNP (last 3 results) No results for input(s): "PROBNP"  in the last 8760 hours.  CBG: Recent Labs  Lab 12/19/23 1628 12/19/23 2139 12/20/23 0631 12/20/23 1039 12/20/23 1228  GLUCAP 182* 156* 152* 138* 154*   Recent Results (from the past 240 hours)  Culture, blood (Routine x 2)     Status: None (Preliminary result)   Collection Time: 12/15/23 11:43 PM   Specimen: BLOOD RIGHT HAND  Result Value Ref Range Status   Specimen Description BLOOD RIGHT HAND  Final   Special Requests   Final    BOTTLES DRAWN AEROBIC AND ANAEROBIC Blood Culture results may not be optimal due to an inadequate volume of blood received in culture bottles   Culture   Final    NO GROWTH 3 DAYS Performed at [facility], [address] [place], [place] [address]    Report Status PENDING  Incomplete  Culture, blood (Routine x 2)     Status: None (Preliminary result)   Collection Time: 12/15/23 11:43 PM   Specimen: BLOOD RIGHT HAND  Result Value Ref Range Status   Specimen Description BLOOD RIGHT HAND  Final   Special Requests   Final    BOTTLES DRAWN AEROBIC ONLY Blood Culture adequate volume   Culture   Final    NO GROWTH 3 DAYS Performed at [facility], [address] [place], [place] [address]    Report Status PENDING  Incomplete  Surgical PCR screen     Status: None   Collection Time: 12/19/23  7:50 PM   Specimen: Nasal Mucosa; Nasal Swab  Result Value Ref Range Status   MRSA, PCR NEGATIVE NEGATIVE Final    Staphylococcus aureus NEGATIVE NEGATIVE Final    Comment: (NOTE) The Xpert SA Assay (FDA approved for NASAL specimens in patients 40 years of age and older), is one component of a comprehensive surveillance program. It is not intended to diagnose infection nor to guide or monitor treatment. Performed at [facility], [address], [place], [place] [address]      Studies: No results found.     Timmy Bubeck, MD  [facility] 12/20/2023  If 7PM-7AM, please contact night-coverage

## 2023-12-20 NOTE — Transfer of Care (Addendum)
 Immediate Anesthesia Transfer of Care Note  Patient: Kevin Bradford  Procedure(s) Performed: AMPUTATION RIGHT BELOW KNEE (Right: Knee)  Patient Location: PACU  Anesthesia Type:GA combined with regional for post-op pain  Level of Consciousness: awake, alert , and patient cooperative  Airway & Oxygen Therapy: Patient Spontanous Breathing and Patient connected to face mask oxygen  Post-op Assessment: Report given to RN and Post -op Vital signs reviewed and stable  Post vital signs: Reviewed and stable  Last Vitals:  Vitals Value Taken Time  BP 127/67 12/20/23 1345  Temp 37.2 C 12/20/23 1345  Pulse 85 12/20/23 1356  Resp 14 12/20/23 1356  SpO2 96 % 12/20/23 1356  Vitals shown include unfiled device data.  Last Pain:  Vitals:   12/20/23 1345  TempSrc:   PainSc: 0-No pain      Patients Stated Pain Goal: 3 (12/20/23 1228)  Complications: No notable events documented.

## 2023-12-20 NOTE — Anesthesia Procedure Notes (Signed)
 Procedure Name: LMA Insertion Date/Time: 12/20/2023 12:59 PM  Performed by: Dawna Etienne, CRNAPre-anesthesia Checklist: Patient identified, Emergency Drugs available, Suction available and Patient being monitored Patient Re-evaluated:Patient Re-evaluated prior to induction Oxygen Delivery Method: Circle system utilized Preoxygenation: Pre-oxygenation with 100% oxygen Induction Type: IV induction Ventilation: Mask ventilation without difficulty LMA: LMA flexible inserted LMA Size: 5.0 Number of attempts: 1 Placement Confirmation: positive ETCO2 and breath sounds checked- equal and bilateral Tube secured with: Tape Dental Injury: Teeth and Oropharynx as per pre-operative assessment

## 2023-12-20 NOTE — Anesthesia Postprocedure Evaluation (Signed)
 Anesthesia Post Note  Patient: Baine Decesare  Procedure(s) Performed: AMPUTATION RIGHT BELOW KNEE (Right: Knee)     Patient location during evaluation: PACU Anesthesia Type: General Level of consciousness: awake and alert Pain management: pain level controlled Vital Signs Assessment: post-procedure vital signs reviewed and stable Respiratory status: spontaneous breathing, nonlabored ventilation, respiratory function stable and patient connected to nasal cannula oxygen Cardiovascular status: blood pressure returned to baseline and stable Postop Assessment: no apparent nausea or vomiting Anesthetic complications: no  No notable events documented.  Last Vitals:  Vitals:   12/20/23 1400 12/20/23 1415  BP: 123/60 122/66  Pulse: 86 87  Resp: 15 17  Temp:  37.2 C  SpO2: 97% 96%    Last Pain:  Vitals:   12/20/23 1415  TempSrc:   PainSc: 0-No pain                 Willian Harrow

## 2023-12-20 NOTE — Anesthesia Procedure Notes (Signed)
 Anesthesia Regional Block: Adductor canal block   Pre-Anesthetic Checklist: , timeout performed,  Correct Patient, Correct Site, Correct Laterality,  Correct Procedure, Correct Position, site marked,  Risks and benefits discussed,  Surgical consent,  Pre-op evaluation,  At surgeon's request and post-op pain management  Laterality: Right  Prep: chloraprep       Needles:  Injection technique: Single-shot  Needle Type: Echogenic Stimulator Needle     Needle Length: 9cm  Needle Gauge: 21     Additional Needles:   Procedures:,,,, ultrasound used (permanent image in chart),,    Narrative:  Start time: 12/20/2023 12:30 PM End time: 12/20/2023 12:35 PM Injection made incrementally with aspirations every 5 mL.  Performed by: Personally  Anesthesiologist: Willian Harrow, MD  Additional Notes: Discussed risks and benefits of the nerve block in detail, including but not limited vascular injury, permanent nerve damage and infection.   Patient tolerated the procedure well. Local anesthetic introduced in an incremental fashion under minimal resistance after negative aspirations. No paresthesias were elicited. After completion of the procedure, no acute issues were identified and patient continued to be monitored by RN.

## 2023-12-21 ENCOUNTER — Encounter (HOSPITAL_COMMUNITY): Payer: Self-pay | Admitting: Orthopedic Surgery

## 2023-12-21 DIAGNOSIS — E11628 Type 2 diabetes mellitus with other skin complications: Secondary | ICD-10-CM | POA: Diagnosis not present

## 2023-12-21 DIAGNOSIS — L089 Local infection of the skin and subcutaneous tissue, unspecified: Secondary | ICD-10-CM | POA: Diagnosis not present

## 2023-12-21 LAB — CBC
HCT: 30.5 % — ABNORMAL LOW (ref 39.0–52.0)
Hemoglobin: 10.1 g/dL — ABNORMAL LOW (ref 13.0–17.0)
MCH: 31.3 pg (ref 26.0–34.0)
MCHC: 33.1 g/dL (ref 30.0–36.0)
MCV: 94.4 fL (ref 80.0–100.0)
Platelets: 388 10*3/uL (ref 150–400)
RBC: 3.23 MIL/uL — ABNORMAL LOW (ref 4.22–5.81)
RDW: 12.2 % (ref 11.5–15.5)
WBC: 9.5 10*3/uL (ref 4.0–10.5)
nRBC: 0 % (ref 0.0–0.2)

## 2023-12-21 LAB — CULTURE, BLOOD (ROUTINE X 2)
Culture: NO GROWTH
Culture: NO GROWTH
Special Requests: ADEQUATE

## 2023-12-21 LAB — BASIC METABOLIC PANEL WITH GFR
Anion gap: 13 (ref 5–15)
BUN: 19 mg/dL (ref 8–23)
CO2: 22 mmol/L (ref 22–32)
Calcium: 8.4 mg/dL — ABNORMAL LOW (ref 8.9–10.3)
Chloride: 97 mmol/L — ABNORMAL LOW (ref 98–111)
Creatinine, Ser: 1.27 mg/dL — ABNORMAL HIGH (ref 0.61–1.24)
GFR, Estimated: 58 mL/min — ABNORMAL LOW (ref >60)
Glucose, Bld: 331 mg/dL — ABNORMAL HIGH (ref 70–99)
Potassium: 4.3 mmol/L (ref 3.5–5.1)
Sodium: 132 mmol/L — ABNORMAL LOW (ref 135–145)

## 2023-12-21 LAB — GLUCOSE, CAPILLARY
Glucose-Capillary: 345 mg/dL — ABNORMAL HIGH (ref 70–99)
Glucose-Capillary: 346 mg/dL — ABNORMAL HIGH (ref 70–99)
Glucose-Capillary: 296 mg/dL — ABNORMAL HIGH (ref 70–99)
Glucose-Capillary: 218 mg/dL — ABNORMAL HIGH (ref 70–99)

## 2023-12-21 LAB — MAGNESIUM: Magnesium: 2 mg/dL (ref 1.7–2.4)

## 2023-12-21 MED ORDER — ENOXAPARIN SODIUM 40 MG/0.4ML IJ SOSY
40.0000 mg | PREFILLED_SYRINGE | Freq: Every day | INTRAMUSCULAR | Status: DC
  Administered 2023-12-21 – 2023-12-25 (×5): 40 mg via SUBCUTANEOUS
  Filled 2023-12-21 (×5): qty 0.4

## 2023-12-21 MED ORDER — INSULIN ASPART 100 UNIT/ML IJ SOLN
4.0000 [IU] | Freq: Three times a day (TID) | INTRAMUSCULAR | Status: DC
  Administered 2023-12-21 (×3): 4 [IU] via SUBCUTANEOUS

## 2023-12-21 MED ORDER — CLOPIDOGREL BISULFATE 75 MG PO TABS
75.0000 mg | ORAL_TABLET | Freq: Every day | ORAL | Status: DC
  Administered 2023-12-21 – 2023-12-25 (×5): 75 mg via ORAL
  Filled 2023-12-21 (×5): qty 1

## 2023-12-21 MED ORDER — INSULIN GLARGINE-YFGN 100 UNIT/ML ~~LOC~~ SOLN
25.0000 [IU] | Freq: Every day | SUBCUTANEOUS | Status: DC
  Administered 2023-12-21 – 2023-12-24 (×4): 25 [IU] via SUBCUTANEOUS
  Filled 2023-12-21 (×5): qty 0.25

## 2023-12-21 NOTE — Progress Notes (Signed)
 Inpatient Rehabilitation Admissions Coordinator   Rehab consult received. Await therapy evals to assist with rehab venue options.  Jeannetta Millman, RN, MSN Rehab Admissions Coordinator [phone] 12/21/2023 4:35 PM

## 2023-12-21 NOTE — Evaluation (Signed)
 Occupational Therapy Evaluation Patient Details Name: Kevin Bradford MRN: [id number] DOB: [birth year] Today's Date: 12/21/2023   History of Present Illness   Patient 78 y.o. male presented to [facility] with Rt foot infection w/ abscess and calcaneal osteomyelitis. Pt now s/p Rt BKA on 12/20/23. PMH significant for poorly controlled DM, CVA, CKD stage III, and hx of osteomyelitis status post Lt BKA and revision in 2019.     Clinical Impressions At baseline, pt is Independent to Mod I with ADLs, IADLs, and functional mobility, and drives. Pt now presents with decreased balance, decreased activity tolerance, decreased knowledge of DME/AE, and decreased safety and independence with functional tasks. Pt currently demonstrates ability to complete UB ADLs Independent to Contact guard assist, LB ADLs with Supervision to Max assist, and functional lateral scoot transfers with Mod assist +2. Pt participated well in session, is motivated to return to PLOF, and has good family support. Pt VSS on RA throughout session. Pt will benefit from acute skilled OT services to address deficits outlined below and to increase safety and independence with functional tasks. Post acute discharge, pt will benefit from intensive inpatient skilled rehab services > 3 hours per day to maximize rehab potential.      If plan is discharge home, recommend the following:   A lot of help with walking and/or transfers;A lot of help with bathing/dressing/bathroom;Assistance with cooking/housework;Assist for transportation;Help with stairs or ramp for entrance     Functional Status Assessment   Patient has had a recent decline in their functional status and demonstrates the ability to make significant improvements in function in a reasonable and predictable amount of time.     Equipment Recommendations   Other (comment);Wheelchair (measurements OT) (drop-arm BSC; wheelchait with flip-back arm rests)     Recommendations for Other  Services   Rehab consult     Precautions/Restrictions   Precautions Precautions: Fall Precaution/Restrictions Comments: denies falls ("I've never fallen"); wound vac to R LE Required Braces or Orthoses: Other Brace Other Brace: R LE limb protector Restrictions Weight Bearing Restrictions Per Provider Order: Yes RLE Weight Bearing Per Provider Order: Non weight bearing Other Position/Activity Restrictions: New R BKA     Mobility Bed Mobility Overal bed mobility: Needs Assistance Bed Mobility: Sidelying to Sit   Sidelying to sit: Mod assist, Used rails, HOB elevated            Transfers Overall transfer level: Needs assistance Equipment used: Rolling walker (2 wheels), None (with L LE prosthetic donned) Transfers: Sit to/from Stand, Bed to chair/wheelchair/BSC Sit to Stand: Max assist, +2 physical assistance, +2 safety/equipment          Lateral/Scoot Transfers: Mod assist, From elevated surface, +2 safety/equipment        Balance Overall balance assessment: Needs assistance Sitting-balance support: Single extremity supported, No upper extremity supported, Feet unsupported Sitting balance-Leahy Scale: Fair Sitting balance - Comments: Pt initially requiring CGA and UE support for balance  but quickly fading to Supervision sitting EOB with no UE support needed for static sitting   Standing balance support: Bilateral upper extremity supported, During functional activity, Reliant on assistive device for balance Standing balance-Leahy Scale: Poor Standing balance comment: with L LE prostetic donned                           ADL either performed or assessed with clinical judgement   ADL Overall ADL's : Needs assistance/impaired Eating/Feeding: Independent;Sitting   Grooming: Set up;Sitting   Upper  Body Bathing: Set up;Sitting   Lower Body Bathing: Moderate assistance;Sitting/lateral leans;Cueing for compensatory techniques   Upper Body Dressing :  Contact guard assist;Sitting   Lower Body Dressing: Supervision/safety;Moderate assistance;Sitting/lateral leans Lower Body Dressing Details (indicate cue type and reason): Pt able to donn L LE prosthetic with Supervision. Pt requiring Mod assist to bring clothing up over hips (simulated due to wound vac) and managing lines in sitting lateral leans Toilet Transfer: Moderate assistance;+2 for safety/equipment;Cueing for safety (lateral scoot; drop-arm BSC) Toilet Transfer Details (indicate cue type and reason): simulated to drop-arm recliner Toileting- Clothing Manipulation and Hygiene: Maximal assistance;Bed level         General ADL Comments: Pt requiring cues for compensatory strategies and increased safety     Vision Baseline Vision/History: 1 Wears glasses (readers) Ability to See in Adequate Light: 0 Adequate (with glasses) Patient Visual Report: No change from baseline       Perception         Praxis         Pertinent Vitals/Pain Pain Assessment Pain Assessment: Faces Faces Pain Scale: Hurts a little bit Pain Location: R LE Pain Descriptors / Indicators: Aching, Discomfort, Operative site guarding Pain Intervention(s): Limited activity within patient's tolerance, Monitored during session, Repositioned     Extremity/Trunk Assessment Upper Extremity Assessment Upper Extremity Assessment: Right hand dominant;Overall WFL for tasks assessed   Lower Extremity Assessment Lower Extremity Assessment: Defer to PT evaluation   Cervical / Trunk Assessment Cervical / Trunk Assessment: Normal   Communication Communication Communication: No apparent difficulties   Cognition Arousal: Alert Behavior During Therapy: WFL for tasks assessed/performed, Anxious (Pt with mild signs of anxiety when attempting STS from EOB) Cognition: No apparent impairments             OT - Cognition Comments: Pt AAOx4 and pleasant throughout session. Pt cognition WFL for tasks assessed.                  Following commands: Intact       Cueing  General Comments   Cueing Techniques: Verbal cues;Visual cues  VSS on RA throughout session. Pt's daughter-in-law present through majority of session.   Exercises     Shoulder Instructions      Home Living Family/patient expects to be discharged to:: Private residence Living Arrangements: Spouse/significant other Available Help at Discharge: Family;Available 24 hours/day Type of Home: House Home Access: Stairs to enter [employer] of Steps: 1 Entrance Stairs-Rails: Left;Right Home Layout: Two level;Able to live on main level with bedroom/bathroom;Bed/bath upstairs (1/2 bath downstairs) Alternate Level Stairs-Number of Steps: 16 Alternate Level Stairs-Rails: Left Bathroom Shower/Tub: Chief Strategy Officer: Handicapped height Bathroom Accessibility: Yes How Accessible: Accessible via walker Home Equipment: Grab bars - tub/shower;Tub bench;Wheelchair - Forensic psychologist (2 wheels)   Additional Comments: family members live nearby and able to offer assistance      Prior Functioning/Environment Prior Level of Function : Independent/Modified Independent;Driving               ADLs Comments: Pt Independent with ADLs and IADLs and drives    OT Problem List: Decreased activity tolerance;Impaired balance (sitting and/or standing);Decreased knowledge of use of DME or AE   OT Treatment/Interventions: Self-care/ADL training;DME and/or AE instruction;Therapeutic activities;Patient/family education;Balance training      OT Goals(Current goals can be found in the care plan section)   Acute Rehab OT Goals Patient Stated Goal: to be independent OT Goal Formulation: With patient Time For Goal Achievement: 01/04/24 Potential  to Achieve Goals: Good ADL Goals Pt Will Perform Lower Body Bathing: with contact guard assist;sitting/lateral leans Pt Will Perform Lower Body Dressing: with contact  guard assist;sitting/lateral leans Pt Will Transfer to Toilet: with contact guard assist (lateral scoot transfer with drop-arm bedside commode) Pt Will Perform Toileting - Clothing Manipulation and hygiene: with contact guard assist;sitting/lateral leans   OT Frequency:  Min 2X/week    Co-evaluation PT/OT/SLP Co-Evaluation/Treatment: Yes Reason for Co-Treatment: For patient/therapist safety;To address functional/ADL transfers   OT goals addressed during session: ADL's and self-care      AM-PAC OT "6 Clicks" Daily Activity     Outcome Measure Help from another person eating meals?: None Help from another person taking care of personal grooming?: A Little Help from another person toileting, which includes using toliet, bedpan, or urinal?: A Lot Help from another person bathing (including washing, rinsing, drying)?: A Lot Help from another person to put on and taking off regular upper body clothing?: A Little Help from another person to put on and taking off regular lower body clothing?: A Lot 6 Click Score: 16   End of Session Equipment Utilized During Treatment: Rolling walker (2 wheels);Gait belt Nurse Communication: Mobility status  Activity Tolerance: Patient tolerated treatment well Patient left: in chair;with call bell/phone within reach;with chair alarm set;with family/visitor present  OT Visit Diagnosis: Other abnormalities of gait and mobility (R26.89)                Time: 9528-4132 OT Time Calculation (min): 55 min Charges:  OT General Charges $OT Visit: 1 Visit OT Evaluation $OT Eval Moderate Complexity: 1 Mod  Kessie Croston "[employer] OTR/L, MA Acute Rehab [phone]   Walt Gunner 12/21/2023, 5:04 PM

## 2023-12-21 NOTE — Progress Notes (Signed)
 PROGRESS NOTE  [name] GNF:[id number] DOB: [birth year] DOA: 12/15/2023 PCP: Jearldine Mina, MD   LOS: 5 days   Brief narrative:  Kevin Bradford is a 78 y.o. male with past medical history significant for poorly controlled diabetes mellitus, history of CVA, CKD stage II, and history of osteomyelitis status post left BKA presented to the hospital with fever and heel wound.  Had temperature of 102 F at home.  Family also noted to have foul-smelling discharge from the wound.  In the ED, patient was afebrile.  Had transient tachycardia and mild hypotension.  Creatinine was elevated at 1.4.  WBC elevated at 16, 900.  Lactic acid was 1.1.  X-ray of the right foot without any acute osteomyelitis.   Blood cultures were collected, MRI was obtained, patient was given Unasyn and linezolid and was at the hospital for further evaluation treatment.   Assessment/Plan: Principal Problem:   Diabetic infection of right foot (HCC) Active Problems:   CKD (chronic kidney disease) stage 2, GFR 60-89 ml/min   Type 2 diabetes mellitus with peripheral neuropathy (HCC)   History of stroke   Acute osteomyelitis of right calcaneus (HCC)   Cutaneous abscess of right foot  Diabetic right foot infection with abscess and calcaneal osteomyelitis Continue IV antibiotics with Unasyn and linezolid.  MRI of the foot on the right shows soft tissue ulceration with gas and signal intensity compatible with calcaneal osteomyelitis with fluid collection possibility of abscess and tenosynovitis.  Dr Julio Ohm from orthopedics saw the patient and patient underwent right below knee amputation on 12/20/2023.[name] Blood cultures negative in 4 days.  Plan discontinuation of antibiotic for 24 hours PT evaluation.  Will likely need placement.     Insulin -dependent DM with hyperglycemia. Review of latest hemoglobin A1c was 13.7 in May 2025.  Continue sliding scale insulin  Accu-Cheks.  Latest POC glucose of 345.  Patient was on Levemir  at home and will  no longer have it due to manufacture problem as per diabetic coordinator.  Will need to give prescription for Lantus  at discharge so that patient will continue to get his supply.  Will add mealtime insulin  and increase his basal insulin  from home.  Will continue to monitor closely.   CKD stage II with mild AKI. Creatinine today at 1.2 from 1.4.  Will continue to monitor.  Received hydration. Baseline creatinine of 1.07   Hx of CVA  Plavix  on hold at this time.  Will check with orthopedics to see if we can restart Plavix  today.  DVT prophylaxis: SCD's Start: 12/20/23 1434 SCDs Start: 12/16/23 0346   Disposition:  Uncertain, might need rehabilitation.  PT OT evaluation pending.  Status is: Inpatient  Remains inpatient appropriate because: IV antibiotic, calcaneal abscess/osteomyelitis, status post right below-knee amputation    Code Status:     Code Status: Full Code  Family Communication: Spoke with the patient's spouse at bedside on 12/20/2023  Consultants: Orthopedics Dr Julio Ohm  Procedures: Right below-knee amputation 12/20/2023  Anti-infectives:  Linezolid and Unasyn IV  Anti-infectives (From admission, onward)    Start     Dose/Rate Route Frequency Ordered Stop   12/20/23 1000  ceFAZolin  (ANCEF ) IVPB 2g/100 mL premix        2 g 200 mL/hr over 30 Minutes Intravenous On call to O.R. 12/19/23 2322 12/20/23 1320   12/16/23 1200  linezolid (ZYVOX) tablet 600 mg       Placed in "And" Linked Group   600 mg Oral Every 12 hours 12/16/23 0347 12/21/23 8469  12/16/23 0600  Ampicillin-Sulbactam (UNASYN) 3 g in sodium chloride  0.9 % 100 mL IVPB        3 g 200 mL/hr over 30 Minutes Intravenous Every 6 hours 12/16/23 0348 12/21/23 2359   12/16/23 0045  Ampicillin-Sulbactam (UNASYN) 3 g in sodium chloride  0.9 % 100 mL IVPB       Placed in "And" Linked Group   3 g 200 mL/hr over 30 Minutes Intravenous  Once 12/16/23 0036 12/16/23 0127   12/16/23 0045  linezolid (ZYVOX) IVPB 600 mg        Placed in "And" Linked Group   600 mg 300 mL/hr over 60 Minutes Intravenous  Once 12/16/23 0036 12/16/23 0319       Subjective: Today, patient was seen and examined at bedside.  Patient denies overt pain.  Denies any nausea vomiting shortness of breath cough fever chills or rigor.  Objective: Vitals:   12/21/23 0317 12/21/23 0847  BP: 102/60 111/64  Pulse: 71 76  Resp: 20   Temp: 97.8 F (36.6 C) 98.3 F (36.8 C)  SpO2: 95% 97%    Intake/Output Summary (Last 24 hours) at 12/21/2023 0959 Last data filed at 12/20/2023 1350 Gross per 24 hour  Intake 600 ml  Output 50 ml  Net 550 ml   Filed Weights   12/20/23 0523 12/20/23 1107 12/21/23 0317  Weight: 82.7 kg 82.7 kg 84.5 kg   Body mass index is 23.92 kg/m.   Physical Exam:  General:  Average built, not in obvious distress, elderly male, Communicative, HENT:   No scleral pallor or icterus noted. Oral mucosa is moist.  Chest:  Clear breath sounds.  Diminished breath sounds bilaterally. No crackles or wheezes.  CVS: S1 &S2 heard. No murmur.  Regular rate and rhythm. Abdomen: Soft, nontender, nondistended.  Bowel sounds are heard.   Extremities: Right below-knee amputation with stump  guard and wound VAC.  Left previous below-knee amputation. Psych: Alert, awake and oriented, normal mood CNS:  No cranial nerve deficits.  Power equal in all extremities.   Skin: Warm and dry.  Right lower extremity with wound VAC and stump guard   Data Review: I have personally reviewed the following laboratory data and studies,  CBC: Recent Labs  Lab 12/15/23 2343 12/16/23 0345 12/17/23 0358 12/18/23 0408 12/19/23 0327 12/20/23 0309 12/21/23 0304  WBC 16.9*   < > 13.8* 13.1* 11.4* 11.1* 9.5  NEUTROABS 14.6*  --   --   --   --  9.2*  --   HGB 12.2*   < > 10.6* 10.5* 11.1* 9.9* 10.1*  HCT 36.6*   < > 32.0* 32.7* 34.4* 30.0* 30.5*  MCV 91.3   < > 93.6 94.8 94.2 92.3 94.4  PLT 453*   < > 407* 450* 454* 415* 388   < > = values in  this interval not displayed.   Basic Metabolic Panel: Recent Labs  Lab 12/17/23 0358 12/18/23 0408 12/19/23 0327 12/20/23 0309 12/21/23 0304  NA 133* 131* 134* 133* 132*  K 4.1 4.0 3.7 3.9 4.3  CL 102 97* 98 97* 97*  CO2 22 22 25 25 22   GLUCOSE 179* 163* 179* 167* 331*  BUN 15 12 8 8 19   CREATININE 1.07 1.14 1.10 1.07 1.27*  CALCIUM  8.5* 8.6* 8.5* 8.5* 8.4*  MG 1.9  --   --   --  2.0  PHOS 3.7  --   --   --   --    Liver Function Tests: Recent  Labs  Lab 12/15/23 2343 12/20/23 0309  AST 40 28  ALT 26 24  ALKPHOS 117 112  BILITOT 1.3* 1.1  PROT 7.1 5.7*  ALBUMIN 2.8* 1.9*   No results for input(s): "LIPASE", "AMYLASE" in the last 168 hours. No results for input(s): "AMMONIA" in the last 168 hours. Cardiac Enzymes: No results for input(s): "CKTOTAL", "CKMB", "CKMBINDEX", "TROPONINI" in the last 168 hours. BNP (last 3 results) No results for input(s): "BNP" in the last 8760 hours.  ProBNP (last 3 results) No results for input(s): "PROBNP" in the last 8760 hours.  CBG: Recent Labs  Lab 12/20/23 1228 12/20/23 1345 12/20/23 1634 12/20/23 2120 12/21/23 0632  GLUCAP 154* 179* 191* 291* 345*   Recent Results (from the past 240 hours)  Culture, blood (Routine x 2)     Status: None (Preliminary result)   Collection Time: 12/15/23 11:43 PM   Specimen: BLOOD RIGHT HAND  Result Value Ref Range Status   Specimen Description BLOOD RIGHT HAND  Final   Special Requests   Final    BOTTLES DRAWN AEROBIC AND ANAEROBIC Blood Culture results may not be optimal due to an inadequate volume of blood received in culture bottles   Culture   Final    NO GROWTH 4 DAYS Performed at [facility], [address], [place], [place] [address]    Report Status PENDING  Incomplete  Culture, blood (Routine x 2)     Status: None (Preliminary result)   Collection Time: 12/15/23 11:43 PM   Specimen: BLOOD RIGHT HAND  Result Value Ref Range Status   Specimen Description BLOOD RIGHT  HAND  Final   Special Requests   Final    BOTTLES DRAWN AEROBIC ONLY Blood Culture adequate volume   Culture   Final    NO GROWTH 4 DAYS Performed at [facility], [address] [place], [place] [address]    Report Status PENDING  Incomplete  Surgical PCR screen     Status: None   Collection Time: 12/19/23  7:50 PM   Specimen: Nasal Mucosa; Nasal Swab  Result Value Ref Range Status   MRSA, PCR NEGATIVE NEGATIVE Final   Staphylococcus aureus NEGATIVE NEGATIVE Final    Comment: (NOTE) The Xpert SA Assay (FDA approved for NASAL specimens in patients 15 years of age and older), is one component of a comprehensive surveillance program. It is not intended to diagnose infection nor to guide or monitor treatment. Performed at [facility], [address], [place], [place] [address]      Studies: No results found.     Kanna Dafoe, MD  [facility] 12/21/2023  If 7PM-7AM, please contact night-coverage

## 2023-12-21 NOTE — Evaluation (Signed)
 Physical Therapy Evaluation Patient Details Name: Kevin Bradford MRN: [id number] DOB: [birth year] Today's Date: 12/21/2023  History of Present Illness  Patient 78 y.o. male presented to [facility] with Rt foot infection w/ abscess and calcaneal osteomyelitis. Pt now s/p Rt BKA on 12/20/23. PMH significant for poorly controlled DM, CVA, CKD stage III, and hx of osteomyelitis status post Lt BKA and revision in 2019.   Clinical Impression  Kevin Bradford is 78 y.o. male admitted with above HPI and diagnosis. Patient is currently limited by functional impairments below (see PT problem list). Patient lives with spouse and is mod ind with Lt prosthesis and no device for in home and outdoor mobility at baseline. Pt reports he is very active and takes care of home and yard maintenance. Currently pt min assist with cues to sequence lateral scoot bed>chair and max +2 for partial lift/50% rise to stand for safety. Pt having difficulty recognizing Rt LE no longer sound and unable to use for support with transfers. Patient will benefit from continued skilled PT interventions to address impairments and progress independence with mobility. Patient will benefit from intensive inpatient follow-up therapy, >3 hours/day. Acute PT will follow and progress as able.         If plan is discharge home, recommend the following: Two people to help with walking and/or transfers;A lot of help with bathing/dressing/bathroom;Assistance with cooking/housework;Assist for transportation;Help with stairs or ramp for entrance   Can travel by private vehicle        Equipment Recommendations None recommended by PT (defer to next venue)  Recommendations for Other Services  Rehab consult    Functional Status Assessment Patient has had a recent decline in their functional status and demonstrates the ability to make significant improvements in function in a reasonable and predictable amount of time.     Precautions / Restrictions  Precautions Precautions: Fall Precaution/Restrictions Comments: denies falls ("I've never fallen"); wound vac to R LE Required Braces or Orthoses: Other Brace Other Brace: R LE limb protector Restrictions Weight Bearing Restrictions Per Provider Order: Yes RLE Weight Bearing Per Provider Order: Non weight bearing Other Position/Activity Restrictions: New R BKA      Mobility  Bed Mobility Overal bed mobility: Needs Assistance Bed Mobility: Sidelying to Sit   Sidelying to sit: Mod assist, Used rails, HOB elevated            Transfers Overall transfer level: Needs assistance Equipment used: Rolling walker (2 wheels), None (with L LE prosthetic donned) Transfers: Sit to/from Stand, Bed to chair/wheelchair/BSC Sit to Stand: Max assist, +2 physical assistance, +2 safety/equipment          Lateral/Scoot Transfers: From elevated surface, +2 safety/equipment, Min assist General transfer comment: pt uanble to fully stand. difficulty shifting onto Lt LE (prosthetic and using UE's on RW for support. pt attempted and performed partial stands with bed rail for support from recliner (2x). min assist for lateral scoot bed>chair.    Ambulation/Gait                  Stairs            Wheelchair Mobility     Tilt Bed    Modified Rankin (Stroke Patients Only)       Balance Overall balance assessment: Needs assistance Sitting-balance support: Single extremity supported, No upper extremity supported, Feet unsupported Sitting balance-Leahy Scale: Fair Sitting balance - Comments: Pt initially requiring CGA and UE support for balance  but quickly fading to Supervision sitting EOB with  no UE support needed for static sitting   Standing balance support: Bilateral upper extremity supported, During functional activity, Reliant on assistive device for balance Standing balance-Leahy Scale: Poor Standing balance comment: with L LE prostetic donned                              Pertinent Vitals/Pain Pain Assessment Pain Assessment: Faces Pain Score: 0-No pain Faces Pain Scale: No hurt Pain Location: R LE Pain Intervention(s): Limited activity within patient's tolerance, Monitored during session, Repositioned    Home Living Family/patient expects to be discharged to:: Private residence Living Arrangements: Spouse/significant other Available Help at Discharge: Family;Available 24 hours/day Type of Home: House Home Access: Stairs to enter Entrance Stairs-Rails: Lawyer of Steps: 1 Alternate Level Stairs-Number of Steps: 16 Home Layout: Two level;Able to live on main level with bedroom/bathroom;Bed/bath upstairs (1/2 bath downstairs) Home Equipment: Grab bars - tub/shower;Tub bench;Wheelchair - Forensic psychologist (2 wheels) Additional Comments: family members live nearby and able to offer assistance    Prior Function Prior Level of Function : Independent/Modified Independent;Driving               ADLs Comments: Pt Independent with ADLs and IADLs and drives     Extremity/Trunk Assessment   Upper Extremity Assessment Upper Extremity Assessment: Defer to OT evaluation;Right hand dominant    Lower Extremity Assessment Lower Extremity Assessment: Generalized weakness;RLE deficits/detail;LLE deficits/detail RLE Deficits / Details: residual limb CDI with shrinker in place and limb protector on. LLE Deficits / Details: skin CDI of residual limb, ROM WFL at hip and knee    Cervical / Trunk Assessment Cervical / Trunk Assessment: Normal  Communication   Communication Communication: No apparent difficulties    Cognition Arousal: Alert Behavior During Therapy: WFL for tasks assessed/performed, Anxious (Pt with mild signs of anxiety when attempting STS from EOB)   PT - Cognitive impairments: No apparent impairments                       PT - Cognition Comments: slightly decreased awareness initially  of new functional level as bil BKA vs unilateral BKA Following commands: Intact       Cueing Cueing Techniques: Verbal cues, Visual cues     General Comments General comments (skin integrity, edema, etc.): VSS on RA throughout session. Pt's daughter-in-law present through majority of session.    Exercises     Assessment/Plan    PT Assessment Patient needs continued PT services  PT Problem List Decreased strength;Decreased knowledge of precautions;Decreased safety awareness;Decreased range of motion;Decreased activity tolerance;Decreased balance;Decreased mobility;Decreased knowledge of use of DME;Cardiopulmonary status limiting activity;Impaired sensation;Decreased skin integrity       PT Treatment Interventions Neuromuscular re-education;Patient/family education;DME instruction;Gait training;Stair training;Functional mobility training;Therapeutic activities;Therapeutic exercise;Balance training;Wheelchair mobility training;Manual techniques    PT Goals (Current goals can be found in the Care Plan section)  Acute Rehab PT Goals Patient Stated Goal: regain independence PT Goal Formulation: With patient Time For Goal Achievement: 12/21/23 Potential to Achieve Goals: Good    Frequency Min 3X/week     Co-evaluation PT/OT/SLP Co-Evaluation/Treatment: Yes Reason for Co-Treatment: For patient/therapist safety;To address functional/ADL transfers PT goals addressed during session: Balance;Mobility/safety with mobility OT goals addressed during session: ADL's and self-care       AM-PAC PT "6 Clicks" Mobility  Outcome Measure Help needed turning from your back to your side while in a flat bed without using bedrails?: A Little  Help needed moving from lying on your back to sitting on the side of a flat bed without using bedrails?: A Little Help needed moving to and from a bed to a chair (including a wheelchair)?: A Little Help needed standing up from a chair using your arms (e.g.,  wheelchair or bedside chair)?: Total Help needed to walk in hospital room?: Total Help needed climbing 3-5 steps with a railing? : Total 6 Click Score: 12    End of Session Equipment Utilized During Treatment: Gait belt Activity Tolerance: Patient tolerated treatment well Patient left: in chair;with call bell/phone within reach;with chair alarm set;with family/visitor present Nurse Communication: Mobility status PT Visit Diagnosis: Unsteadiness on feet (R26.81);Other abnormalities of gait and mobility (R26.89);Muscle weakness (generalized) (M62.81);Difficulty in walking, not elsewhere classified (R26.2)    Time: 5366-4403 PT Time Calculation (min) (ACUTE ONLY): 57 min   Charges:   PT Evaluation $PT Eval Moderate Complexity: 1 Mod PT Treatments $Therapeutic Activity: 23-37 mins PT General Charges $$ ACUTE PT VISIT: 1 Visit         Tish Forge, DPT Acute Rehabilitation Services Office [phone]  12/21/23 7:09 PM

## 2023-12-21 NOTE — Progress Notes (Signed)
 Mobility Specialist Progress Note:   12/21/23 1240  Mobility  Activity Transferred from chair to bed  Level of Assistance Moderate assist, patient does 50-74%  Assistive Device Sliding board  Activity Response Tolerated well  Mobility visit 1 Mobility  Mobility Specialist Start Time (ACUTE ONLY) 1222  Mobility Specialist Stop Time (ACUTE ONLY) 1235  Mobility Specialist Time Calculation (min) (ACUTE ONLY) 13 min   Pt received in chair, requesting assistance to transfer C>B. Required ModA to slide over with bed in lowest postion and chair arm raised. Tolerated well, asx throughout. Pt lying comfortably in bed with all needs met, call bell in reach, bed alarm on.   Mitsuo Budnick Mobility Specialist Please contact via Special educational needs teacher or  Rehab office at [phone]

## 2023-12-21 NOTE — Progress Notes (Signed)
 Patient ID: Kevin Bradford, male   DOB: Aug 07, 1945, 77 y.o.   MRN: [id number] Patient is a 78 year old gentleman who is postop day 1 right below-knee amputation.  Patient is also a left transtibial amputee with his prosthesis.  There is no drainage in the wound VAC canister.  Patient would be a good candidate for inpatient rehab.

## 2023-12-22 DIAGNOSIS — L089 Local infection of the skin and subcutaneous tissue, unspecified: Secondary | ICD-10-CM | POA: Diagnosis not present

## 2023-12-22 DIAGNOSIS — E11628 Type 2 diabetes mellitus with other skin complications: Secondary | ICD-10-CM | POA: Diagnosis not present

## 2023-12-22 DIAGNOSIS — Z794 Long term (current) use of insulin: Secondary | ICD-10-CM

## 2023-12-22 LAB — CBC
HCT: 30.1 % — ABNORMAL LOW (ref 39.0–52.0)
Hemoglobin: 9.8 g/dL — ABNORMAL LOW (ref 13.0–17.0)
MCH: 30.8 pg (ref 26.0–34.0)
MCHC: 32.6 g/dL (ref 30.0–36.0)
MCV: 94.7 fL (ref 80.0–100.0)
Platelets: 418 10*3/uL — ABNORMAL HIGH (ref 150–400)
RBC: 3.18 MIL/uL — ABNORMAL LOW (ref 4.22–5.81)
RDW: 12.1 % (ref 11.5–15.5)
WBC: 9.5 10*3/uL (ref 4.0–10.5)
nRBC: 0 % (ref 0.0–0.2)

## 2023-12-22 LAB — GLUCOSE, CAPILLARY
Glucose-Capillary: 163 mg/dL — ABNORMAL HIGH (ref 70–99)
Glucose-Capillary: 115 mg/dL — ABNORMAL HIGH (ref 70–99)
Glucose-Capillary: 166 mg/dL — ABNORMAL HIGH (ref 70–99)
Glucose-Capillary: 114 mg/dL — ABNORMAL HIGH (ref 70–99)

## 2023-12-22 LAB — BASIC METABOLIC PANEL WITH GFR
Anion gap: 7 (ref 5–15)
BUN: 16 mg/dL (ref 8–23)
CO2: 28 mmol/L (ref 22–32)
Calcium: 8.3 mg/dL — ABNORMAL LOW (ref 8.9–10.3)
Chloride: 99 mmol/L (ref 98–111)
Creatinine, Ser: 1.22 mg/dL (ref 0.61–1.24)
GFR, Estimated: >60 mL/min (ref >60)
Glucose, Bld: 193 mg/dL — ABNORMAL HIGH (ref 70–99)
Potassium: 4 mmol/L (ref 3.5–5.1)
Sodium: 134 mmol/L — ABNORMAL LOW (ref 135–145)

## 2023-12-22 LAB — MAGNESIUM: Magnesium: 2 mg/dL (ref 1.7–2.4)

## 2023-12-22 LAB — SURGICAL PATHOLOGY

## 2023-12-22 LAB — PHOSPHORUS: Phosphorus: 3.1 mg/dL (ref 2.5–4.6)

## 2023-12-22 MED ORDER — INSULIN ASPART 100 UNIT/ML IJ SOLN
6.0000 [IU] | Freq: Three times a day (TID) | INTRAMUSCULAR | Status: DC
  Administered 2023-12-22 – 2023-12-23 (×2): 6 [IU] via SUBCUTANEOUS

## 2023-12-22 NOTE — Inpatient Diabetes Management (Addendum)
 Inpatient Diabetes Program Recommendations  AACE/ADA: New Consensus Statement on Inpatient Glycemic Control (2015)  Target Ranges:  Prepandial:   less than 140 mg/dL      Peak postprandial:   less than 180 mg/dL (1-2 hours)      Critically ill patients:  140 - 180 mg/dL   Lab Results  Component Value Date   GLUCAP 163 (H) 12/22/2023   HGBA1C 12.4 (H) 12/20/2023    Latest Reference Range & Units 12/21/23 06:32 12/21/23 12:36 12/21/23 17:06 12/21/23 21:04 12/22/23 06:20  Glucose-Capillary 70 - 99 mg/dL 657 (H) Novolog  7 units 346 (H) Novolog  9 units 296 (H) Novolog  7 units 218 (H) Novolog  3 units 163 (H) Novolog  2 units  (H): Data is abnormally high  Home DM Meds: Levemir  25 units daily                             Metformin  1000 mg BID                             Novolog  4 units TID with meals   Current Orders:  Semglee  25 units at HS Novolog  4 units tid meal coverage Novolog  Sensitive Correction Scale/ SSI (0-9 units) Q4H  Inpatient Diabetes Program Recommendations:   Please consider: -Increase Novolog  meal coverage to 6 units tid if eats 50%  Spoke with patient @ bedside and reviewed insulin  regimen here in the hospital and patient did not have specific questions. Wife is not @ bedside and explained patient can ask for DM coordinator if has more questions.  Discharge Recommendations: Long acting recommendations: Insulin  Glargine (LANTUS ) Solostar Pen Please switch to Lantus  at time of d/c--Levemir  no longer being manufactured    Use Adult Diabetes Insulin  Treatment Post Discharge order set. Thank you, Xaidyn Kepner E. Tallan Sandoz, RN, MSN, CDCES  Diabetes Coordinator Inpatient Glycemic Control Team Team Pager [phone] (8am-5pm) 12/22/2023 8:44 AM

## 2023-12-22 NOTE — Progress Notes (Addendum)
  Inpatient Rehabilitation Admissions Coordinator   Met with patient at bedside for rehab assessment. We discussed goals and expectations of a possible CIR admit. He states that he thought it was already approved. I explained it is an approval process where I will have a Rehab MD consult today and then contact [employer] to begin Authorization. I will follow up today to meet with he and his wife upon her arrival, to review goals and expectations and to also review estimated cost of care.He prefers CIR for rehab.  Please call me with any questions.   Jeannetta Millman, RN, MSN Rehab Admissions Coordinator [phone]    I met with patient and wife gain to review estimated cost of care , goals and expectations for possible Cir admit. I will begin Auth with [facility] Medicare.  Jeannetta Millman, RN, MSN Rehab Admissions Coordinator [phone] 12/22/2023 11:04 AM

## 2023-12-22 NOTE — PMR Pre-admission (Signed)
 PMR Admission Coordinator Pre-Admission Assessment  Patient: Kevin Bradford is an 78 y.o., male MRN: [id number] DOB: 11/18/1945 Height: 6\' 2"  (188 cm) Weight: 83.1 kg          Insurance Information HMO:     PPO: yes     PCP:      IPA:      80/20:      OTHER:  PRIMARY: [employer] PPO      Policy#: [id number]; Medicare *[id number]      Subscriber: pt CM Name: Kevin Bradford      Phone#: [phone] ext [phone]     Fax#: [phone] Pre-Cert#: [id number] 6/9-6/14  f/u CM Salina Craver phone [phone] ext [phone] fax [phone]     Benefits:  Phone #: [phone]     Name: 6/6 Eff. Date: 09/16/2023     Deduct: none      Out of Pocket Max: none      Life Max: none  CIR: 100%      SNF: 100% Outpatient: 100%      Home Health: 100%    DME: 100%   Providers: in network  SECONDARY: none  Financial Counselor:       Phone#:   The Data processing manager" for patients in Inpatient Rehabilitation Facilities with attached "Privacy Act Statement-Health Care Records" was provided and verbally reviewed with: Patient and Family  Emergency Contact Information Contact Information     Name Relation Home Work Mobile   Spurrier,[name] Spouse   [phone]      Other Contacts   None on File    Current Medical History  Patient Admitting Diagnosis: Right BKA  History of Present Illness: Kevin Bradford is a 78 y.o. male with a past medical history of Arthritis, Below knee amputation , Cellulitis and abscess of leg (10/18/2014), CKD stage 3, GFR 30-59 ml/min , Dehiscence of closure of skin, subsequent encounter, Diabetic foot ulcer , Diabetic peripheral neuropathy , GERD, Osteomyelitis, Stroke, and Type II diabetes mellitus . Kevin Bradford He was admitted to [facility] on 12/15/2023 for foul-smelling discharge from a wound on his right heel, found on ED eval to have acute osteomyelitis.    He was started on Unasyn  and linezolid , and was admitted for further evaluation.Kevin Bradford  He went  underwent right BKA with Dr. Julio Ohm on 6/4.  Hospitalization has been otherwise complicated by uncontrolled diabetes A1c 13.7, AKI on CKD stage II, antibiotics for osteomyelitis which are finished after 4 negative blood cultures, postop wound care, poor pain control managed with IV Dilaudid , constipation, and history of CVA on Plavix  pending restart.   Patient's medical record from [facility] has been reviewed by the rehabilitation admission coordinator and physician.  Past Medical History  Past Medical History:  Diagnosis Date   Arthritis    "joint pain" (08/16/2017)   Below knee amputation (HCC)    Cellulitis and abscess of leg 10/18/2014   CKD (chronic kidney disease) stage 3, GFR 30-59 ml/min (HCC)    creatinine increases with antibiotics per pt, no known kidney disease per patient   Dehiscence of closure of skin, subsequent encounter    Diabetic foot ulcer (HCC)    left   Diabetic peripheral neuropathy (HCC)    GERD (gastroesophageal reflux disease)    12/26/2017- "not this year."    Osteomyelitis (HCC)    left transmetatarsal    Stroke (HCC)    Type II diabetes mellitus (HCC)    Has the patient had major surgery during 100 days  prior to admission? Yes  Family History  family history includes Dementia in his mother.  Current Medications   Current Facility-Administered Medications:    acetaminophen  (TYLENOL ) tablet 325-650 mg, 325-650 mg, Oral, Q6H PRN, Butch Cashing, PA-C, 650 mg at 12/23/23 0846   alum & mag hydroxide-simeth (MAALOX/MYLANTA) 200-200-20 MG/5ML suspension 15-30 mL, 15-30 mL, Oral, Q2H PRN, Hazeline Lister, Emma M, PA-C   ascorbic acid  (VITAMIN C ) tablet 500 mg, 500 mg, Oral, BID, Arnaldo Betters M, PA-C, 500 mg at 12/24/23 4098   clopidogrel  (PLAVIX ) tablet 75 mg, 75 mg, Oral, Daily, Pokhrel, Laxman, MD, 75 mg at 12/24/23 0904   docusate sodium  (COLACE) capsule 100 mg, 100 mg, Oral, Daily, Arnaldo Betters M, PA-C, 100 mg at 12/23/23 0847   enoxaparin  (LOVENOX )  injection 40 mg, 40 mg, Subcutaneous, Daily, Pokhrel, Laxman, MD, 40 mg at 12/24/23 0903   guaiFENesin -dextromethorphan (ROBITUSSIN DM) 100-10 MG/5ML syrup 15 mL, 15 mL, Oral, Q4H PRN, Hazeline Lister, Emma M, PA-C   hydrALAZINE  (APRESOLINE ) injection 5 mg, 5 mg, Intravenous, Q20 Min PRN, Hazeline Lister, Emma M, PA-C   HYDROmorphone  (DILAUDID ) injection 0.5-1 mg, 0.5-1 mg, Intravenous, Q4H PRN, Jobe Mulder, DO, 0.5 mg at 12/24/23 0942   insulin  aspart (novoLOG ) injection 0-9 Units, 0-9 Units, Subcutaneous, TID AC & HS, Collins, Emma M, PA-C, 1 Units at 12/24/23 1718   insulin  aspart (novoLOG ) injection 6 Units, 6 Units, Subcutaneous, TID WC, Pokhrel, Laxman, MD, 6 Units at 12/23/23 1305   insulin  glargine-yfgn (SEMGLEE ) injection 25 Units, 25 Units, Subcutaneous, QHS, Pokhrel, Laxman, MD, 25 Units at 12/23/23 2116   labetalol  (NORMODYNE ) injection 10 mg, 10 mg, Intravenous, Q10 min PRN, Hazeline Lister, Emma M, PA-C   magnesium  sulfate IVPB 2 g 50 mL, 2 g, Intravenous, Daily PRN, Hazeline Lister, Emma M, PA-C   melatonin tablet 3 mg, 3 mg, Oral, QHS, Collins, Emma M, PA-C, 3 mg at 12/23/23 2116   metoprolol  tartrate (LOPRESSOR ) injection 2-5 mg, 2-5 mg, Intravenous, Q2H PRN, Hazeline Lister, Emma M, PA-C   multivitamin with minerals tablet 1 tablet, 1 tablet, Oral, Daily, Arnaldo Betters M, PA-C, 1 tablet at 12/24/23 1191   mupirocin  ointment (BACTROBAN ) 2 % 1 Application, 1 Application, Nasal, BID, Butch Cashing, PA-C, 1 Application at 12/24/23 4782   pantoprazole  (PROTONIX ) EC tablet 40 mg, 40 mg, Oral, Daily, Arnaldo Betters M, PA-C, 40 mg at 12/24/23 9562   phenol (CHLORASEPTIC) mouth spray 1 spray, 1 spray, Mouth/Throat, PRN, Hazeline Lister, Emma M, PA-C   potassium chloride  SA (KLOR-CON  M) CR tablet 20-40 mEq, 20-40 mEq, Oral, Daily PRN, Hazeline Lister, Emma M, PA-C   prochlorperazine  (COMPAZINE ) injection 5 mg, 5 mg, Intravenous, Q6H PRN, Hazeline Lister, Emma M, PA-C   protein supplement (ENSURE MAX) liquid, 11 oz, Oral, BID, Arnaldo Betters  M, PA-C, 11 oz at 12/23/23 1308   senna-docusate (Senokot-S) tablet 1 tablet, 1 tablet, Oral, QHS PRN, Butch Cashing, PA-C, 1 tablet at 12/23/23 2116   sodium chloride  flush (NS) 0.9 % injection 3 mL, 3 mL, Intravenous, Q12H, Collins, Emma M, PA-C, 3 mL at 12/24/23 0908   traMADol  (ULTRAM ) tablet 50 mg, 50 mg, Oral, Q6H, Collins, Emma M, PA-C, 50 mg at 12/24/23 1718   zinc  sulfate (50mg  elemental zinc ) capsule 220 mg, 220 mg, Oral, Daily, Arnaldo Betters M, PA-C, 220 mg at 12/24/23 6578  Patients Current Diet:  Diet Order             Diet Carb Modified Fluid consistency: Thin; Room service appropriate? 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  Independent  Indoor Mobility: Did the patient need assistance with walking from room to room (with or without device)? Independent  Stairs: Did the patient need assistance with internal or external stairs (with or without device)? Independent  Functional Cognition: Did the patient need help planning regular tasks such as shopping or remembering to take medications? Independent  Patient Information Are you of Hispanic, Latino/a,or Spanish  origin?: A. No, not of Hispanic, Latino/a, or Spanish origin What is your race?: A. White Do you need or want an interpreter to communicate with a doctor or health care staff?: 0. No  Patient's Response To:  Health Literacy and Transportation Is the patient able to respond to health literacy and transportation needs?: Yes Health Literacy - How often do you need to have someone help you when you read instructions, pamphlets, or other written material from your doctor or pharmacy?: Never In the past 12 months, has lack of transportation kept you from medical appointments or from getting medications?: No In the past 12 months, has lack of transportation kept you from meetings, work, or from getting things needed for daily living?: No  Home Assistive Devices / Equipment Home Equipment: Grab bars - tub/shower, Tub bench, Wheelchair - manual, Agricultural consultant (2 wheels)  Prior Device Use: Indicate devices/aids used by the patient prior to current illness, exacerbation or injury? LLE prosthesis, RW  Current Functional Level Cognition  Orientation Level: Oriented X4    Extremity Assessment (includes Sensation/Coordination)  Upper Extremity Assessment: Defer to OT evaluation, Right hand dominant  Lower Extremity Assessment: Generalized weakness, RLE deficits/detail, LLE deficits/detail RLE Deficits / Details: residual limb CDI with shrinker in place and limb protector on. LLE Deficits / Details: skin CDI of residual limb, ROM WFL at hip and knee    ADLs  Overall ADL's : Needs assistance/impaired Eating/Feeding: Independent, Sitting Grooming: Set up, Sitting Upper Body Bathing: Set up, Sitting Lower Body Bathing: Moderate assistance, Sitting/lateral leans, Cueing for compensatory techniques Upper Body Dressing : Contact guard assist, Sitting Lower Body Dressing: Supervision/safety, Moderate assistance, Sitting/lateral leans Lower Body Dressing Details (indicate cue type and reason): Pt able to  donn L LE prosthetic with Supervision. Pt requiring Mod assist to bring clothing up over hips (simulated due to wound vac) and managing lines in sitting lateral leans Toilet Transfer: Moderate assistance, +2 for safety/equipment, Cueing for safety (lateral scoot; drop-arm BSC) Toilet Transfer Details (indicate cue type and reason): simulated to drop-arm recliner Toileting- Clothing Manipulation and Hygiene: Maximal assistance, Bed level General ADL Comments: Pt requiring cues for compensatory strategies and increased safety    Mobility  Overal bed mobility: Needs Assistance Bed Mobility: Supine to Sit, Sit to Supine, Rolling Rolling: Min assist Sidelying to sit: Mod assist, Used rails, HOB elevated Supine to sit: Min  assist, HOB elevated, Used rails Sit to supine: Min assist, HOB elevated, Used rails    Transfers  Overall transfer level: Needs assistance Equipment used: None (with L LE prosthetic donned) Transfers: Bed to chair/wheelchair/BSC Sit to Stand: Max assist, +2 physical assistance, +2 safety/equipment Bed to/from chair/wheelchair/BSC transfer type:: Lateral/scoot transfer  Lateral/Scoot Transfers: From elevated surface, +2 safety/equipment, Min assist General transfer comment: min-mod +2 assist for lateral scoot bed<>BSC. no slide board available but would improve pt's ability to transfer.    Ambulation / Gait / Stairs / Engineer, drilling / Balance Dynamic Sitting Balance Sitting balance - Comments: Pt initially requiring CGA and UE support for balance  but quickly fading to Supervision sitting EOB with no UE support needed for static sitting Balance Overall balance assessment: Needs assistance Sitting-balance support: Single extremity supported, No upper extremity supported, Feet unsupported Sitting balance-Leahy Scale: Fair Sitting balance - Comments: Pt initially requiring CGA and UE support for balance  but quickly fading to Supervision sitting EOB with  no UE support needed for static sitting Standing balance support: Bilateral upper extremity supported, During functional activity, Reliant on assistive device for balance Standing balance-Leahy Scale: Poor Standing balance comment: with L LE prostetic donned    Special needs/care consideration Surgical wound VAC CIR 11/2017 then went home Hgb A1c 13.7    Previous Home Environment  Living Arrangements: Spouse/significant other  Lives With: Spouse Available Help at Discharge: Family, Available 24 hours/day Type of Home: House Home Layout: Two level, Able to live on main level with bedroom/bathroom, Bed/bath upstairs Alternate Level Stairs-Rails: Left Alternate Level Stairs-Number of Steps: 16 Home Access: Stairs to enter Entrance Stairs-Rails: Left, Right Entrance Stairs-Number of Steps: 1 Bathroom Shower/Tub: Engineer, manufacturing systems: Handicapped height Bathroom Accessibility: Yes How Accessible: Accessible via walker Home Care Services: No Additional Comments: family members live nearby and able to offer assistance  Discharge Living Setting Plans for Discharge Living Setting: Patient's home, Lives with (comment) (wife) Type of Home at Discharge: House Discharge Home Layout: Two level, Able to live on main level with bedroom/bathroom Alternate Level Stairs-Rails: Left Alternate Level Stairs-Number of Steps: 16 Discharge Home Access: Stairs to enter Entrance Stairs-Rails: Right, Left Entrance Stairs-Number of Steps: 1 Discharge Bathroom Shower/Tub: Tub/shower unit Discharge Bathroom Toilet: Handicapped height Discharge Bathroom Accessibility: Yes How Accessible: Accessible via walker Does the patient have any problems obtaining your medications?: No  Social/Family/Support Systems Patient Roles: Spouse Contact Information: wife, [name] Anticipated Caregiver: wife Anticipated Industrial/product designer Information: see contacts Ability/Limitations of Caregiver:  none Caregiver Availability: 24/7 Discharge Plan Discussed with Primary Caregiver: Yes Is Caregiver In Agreement with Plan?: Yes Does Caregiver/Family have Issues with Lodging/Transportation while Pt is in Rehab?: No  Goals Patient/Family Goal for Rehab: mod I to supervision with PT and OT Expected length of stay: ELOS 7 days Additional Information: Has LLE prosthesis Pt/Family Agrees to Admission and willing to participate: Yes Program Orientation Provided & Reviewed with Pt/Caregiver Including Roles  & Responsibilities: Yes  Decrease burden of Care through IP rehab admission: n/a  Possible need for SNF placement upon discharge:not anticipated  Patient Condition: This patient's condition remains as documented in the consult dated 12/22/2023, in which the Rehabilitation Physician determined and documented that the patient's condition is appropriate for intensive rehabilitative care in an inpatient rehabilitation facility. Will admit to inpatient rehab today.  Preadmission Screen Completed By:  Tanya Fantasia, RN MSN 12/24/2023 5:58 PM, updates provided by Kristyn Conetta, PT 12/25/23  ______________________________________________________________________   Discussed status with Dr. Rayleen Cal on 12/25/23 at 9:00 and received approval for admission today.  Admission Coordinator:  Janel Medford MSN, updates provided by Kristyn Conetta, PT  time 13:00 Date 12/25/23

## 2023-12-22 NOTE — Consult Note (Signed)
 Physical Medicine and Rehabilitation Consult Reason for Consult: Evaluate appropriateness for Inpatient Rehab Referring Physician: Dr. Efrain Grant    HPI: Domenic Schoenberger is a 78 y.o. male with PMHx of  has a past medical history of Arthritis, Below knee amputation (HCC), Cellulitis and abscess of leg (10/18/2014), CKD (chronic kidney disease) stage 3, GFR 30-59 ml/min (HCC), Dehiscence of closure of skin, subsequent encounter, Diabetic foot ulcer (HCC), Diabetic peripheral neuropathy (HCC), GERD (gastroesophageal reflux disease), Osteomyelitis (HCC), Stroke (HCC), and Type II diabetes mellitus (HCC). . They were admitted to [facility] on 12/15/2023 for foul-smelling discharge from a wound on his right heel, found on ED eval to have acute osteomyelitis.  He was started on Unasyn and linezolid, and was admitted for further evaluation.[name]  He went underwent right BKA with Dr. Julio Ohm on 6/4.  Hospitalization has been otherwise complicated by uncontrolled diabetes A1c 13.7, AKI on CKD stage II, antibiotics for osteomyelitis which are finished after 4 negative blood cultures, postop wound care, poor pain control managed with IV Dilaudid , constipation, and history of CVA on Plavix  pending restart.  PM&R was consulted to evaluate appropriateness for IPR admission.    On chart review, patient lives in a two-level home with first-floor set up with one-step to enter.  Family is available 24-7 with his significant other.  He additionally has other family members that live nearby and can offer assistance.  Prior to admission, he was independent with ADLs, IADLs, and mobility with a prosthetic on his left BKA without assistive device; noted to be very active with home and yard maintenance at baseline, and patient reports he was hauling toilets up and down steps the week prior to admission.  Currently, he is max assist +2 for sit to stand, mod assist for lateral scoot transfers, no attempted steps as patient is  having difficulty recognizing right lower extremity no longer sound for support.  Review of Systems  Constitutional:  Negative for chills and fever.  Respiratory:  Negative for cough and shortness of breath.   Cardiovascular:  Negative for chest pain and palpitations.  Gastrointestinal:  Positive for constipation. Negative for abdominal pain, diarrhea and vomiting.  Genitourinary:  Negative for dysuria and urgency.  Musculoskeletal:  Negative for back pain and joint pain.  Neurological:  Positive for sensory change and focal weakness. Negative for dizziness and headaches.  Psychiatric/Behavioral:  The patient is nervous/anxious and has insomnia.    Past Medical History:  Diagnosis Date   Arthritis    "joint pain" (08/16/2017)   Below knee amputation (HCC)    Cellulitis and abscess of leg 10/18/2014   CKD (chronic kidney disease) stage 3, GFR 30-59 ml/min (HCC)    creatinine increases with antibiotics per pt, no known kidney disease per patient   Dehiscence of closure of skin, subsequent encounter    Diabetic foot ulcer (HCC)    left   Diabetic peripheral neuropathy (HCC)    GERD (gastroesophageal reflux disease)    12/26/2017- "not this year."    Osteomyelitis (HCC)    left transmetatarsal    Stroke (HCC)    Type II diabetes mellitus (HCC)    Past Surgical History:  Procedure Laterality Date   AMPUTATION Left 07/19/2017   Procedure: LEFT TRANSMETATARSAL AMPUTATION;  Surgeon: Timothy Ford, MD;  Location: [facility] OR;  Service: Orthopedics;  Laterality: Left;   AMPUTATION Left 08/16/2017   REVISION LEFT TRANSMETATARSAL AMPUTATION   AMPUTATION Left 11/22/2017   Procedure: LEFT BELOW KNEE AMPUTATION;  Surgeon: Timothy Ford, MD;  Location: [facility] OR;  Service: Orthopedics;  Laterality: Left;   AMPUTATION Right 12/20/2023   Procedure: AMPUTATION RIGHT BELOW KNEE;  Surgeon: Timothy Ford, MD;  Location: [facility] OR;  Service: Orthopedics;  Laterality: Right;   CATARACT EXTRACTION W/ INTRAOCULAR LENS   IMPLANT, BILATERAL  ~ 2016   EYE SURGERY Bilateral    catarct   I & D EXTREMITY Left 10/17/2014   Procedure: IRRIGATION AND DEBRIDEMENT EXTREMITY LEFT FOOT;  Surgeon: Timothy Ford, MD;  Location: [facility] OR;  Service: Orthopedics;  Laterality: Left;   IR ANGIO INTRA EXTRACRAN SEL COM CAROTID INNOMINATE BILAT MOD SED  05/07/2018   IR ANGIO VERTEBRAL SEL SUBCLAVIAN INNOMINATE BILAT MOD SED  05/07/2018   STUMP REVISION Left 08/16/2017   Procedure: REVISION LEFT TRANSMETATARSAL AMPUTATION;  Surgeon: Timothy Ford, MD;  Location: [facility] OR;  Service: Orthopedics;  Laterality: Left;   STUMP REVISION Left 12/27/2017   Procedure: LEFT BELOW KNEE AMPUTATION REVISION;  Surgeon: Timothy Ford, MD;  Location: [facility] OR;  Service: Orthopedics;  Laterality: Left;   TOE AMPUTATION Right 2013   5th toe   Family History  Problem Relation Age of Onset   Dementia Mother    Social History:  reports that he has never smoked. He has never used smokeless tobacco. He reports current alcohol use of about 1.0 standard drink of alcohol per week. He reports that he does not use drugs. Allergies:  Allergies  Allergen Reactions   Roxicodone  [Oxycodone ] Anaphylaxis, Shortness Of Breath and Other (See Comments)    Throat closed   Medications Prior to Admission  Medication Sig Dispense Refill   acetaminophen  (TYLENOL ) 500 MG tablet Take 500-1,000 mg by mouth every 6 (six) hours as needed for moderate pain (pain score 4-6), headache or fever.     Ascorbic Acid (VITAMIN C PO) Take 1 tablet by mouth daily.     Cholecalciferol (VITAMIN D3 PO) Take 1 capsule by mouth daily.     clopidogrel  (PLAVIX ) 75 MG tablet Take 1 tablet (75 mg total) by mouth daily. 30 tablet 6   diclofenac Sodium (VOLTAREN) 1 % GEL Apply 1 Application topically 4 (four) times daily as needed (pain).     insulin  aspart (NOVOLOG  FLEXPEN) 100 UNIT/ML FlexPen Inject 4 Units into the skin 3 (three) times daily with meals. 15 mL 11   insulin  detemir (LEVEMIR   FLEXTOUCH) 100 UNIT/ML FlexPen Inject 25 Units into the skin daily at 10 pm. 15 mL 2   MAGNESIUM  PO Take 1 tablet by mouth daily.     metFORMIN  (GLUCOPHAGE ) 500 MG tablet Take 1,000 mg by mouth 2 (two) times daily with a meal.     VITAMIN A PO Take 1 capsule by mouth daily.      Home: Home Living Family/patient expects to be discharged to:: Private residence Living Arrangements: Spouse/significant other Available Help at Discharge: Family, Available 24 hours/day Type of Home: House Home Access: Stairs to enter [employer] of Steps: 1 Entrance Stairs-Rails: Left, Right Home Layout: Two level, Able to live on main level with bedroom/bathroom, Bed/bath upstairs (1/2 bath downstairs) Alternate Level Stairs-Number of Steps: 16 Alternate Level Stairs-Rails: Left Bathroom Shower/Tub: Engineer, manufacturing systems: Handicapped height Bathroom Accessibility: Yes Home Equipment: Grab bars - tub/shower, Tub bench, Wheelchair - manual, Agricultural consultant (2 wheels) Additional Comments: family members live nearby and able to offer assistance  Functional History: Prior Function Prior Level of Function : Independent/Modified Independent, Driving ADLs Comments: Pt Independent  with ADLs and IADLs and drives Functional Status:  Mobility: Bed Mobility Overal bed mobility: Needs Assistance Bed Mobility: Sidelying to Sit Sidelying to sit: Mod assist, Used rails, HOB elevated Transfers Overall transfer level: Needs assistance Equipment used: Rolling walker (2 wheels), None (with L LE prosthetic donned) Transfers: Sit to/from Stand, Bed to chair/wheelchair/BSC Sit to Stand: Max assist, +2 physical assistance, +2 safety/equipment Bed to/from chair/wheelchair/BSC transfer type:: Lateral/scoot transfer  Lateral/Scoot Transfers: From elevated surface, +2 safety/equipment, Min assist General transfer comment: pt uanble to fully stand. difficulty shifting onto Lt LE (prosthetic and using UE's on  RW for support. pt attempted and performed partial stands with bed rail for support from recliner (2x). min assist for lateral scoot bed>chair.      ADL: ADL Overall ADL's : Needs assistance/impaired Eating/Feeding: Independent, Sitting Grooming: Set up, Sitting Upper Body Bathing: Set up, Sitting Lower Body Bathing: Moderate assistance, Sitting/lateral leans, Cueing for compensatory techniques Upper Body Dressing : Contact guard assist, Sitting Lower Body Dressing: Supervision/safety, Moderate assistance, Sitting/lateral leans Lower Body Dressing Details (indicate cue type and reason): Pt able to donn L LE prosthetic with Supervision. Pt requiring Mod assist to bring clothing up over hips (simulated due to wound vac) and managing lines in sitting lateral leans Toilet Transfer: Moderate assistance, +2 for safety/equipment, Cueing for safety (lateral scoot; drop-arm BSC) Toilet Transfer Details (indicate cue type and reason): simulated to drop-arm recliner Toileting- Clothing Manipulation and Hygiene: Maximal assistance, Bed level General ADL Comments: Pt requiring cues for compensatory strategies and increased safety  Cognition: Cognition Orientation Level: Oriented X4 Cognition Arousal: Alert Behavior During Therapy: WFL for tasks assessed/performed, Anxious (Pt with mild signs of anxiety when attempting STS from EOB)  Blood pressure 106/65, pulse 76, temperature 97.7 F (36.5 C), temperature source Oral, resp. rate 20, height 6\' 2"  (1.88 m), weight 81.4 kg, SpO2 93%. Physical Exam  PE: Constitution: Appropriate appearance for age. No apparent distress   Resp: No respiratory distress. No accessory muscle usage. on RA and CTAB Cardio: Well perfused appearance. No peripheral edema. Abdomen: Nondistended. Nontender.   Psych: Appropriate mood and affect. Neuro: AAOx4. No apparent cognitive deficits   Neurologic Exam:   DTRs: Reflexes were 2+ in bilateral   patella, biceps, BR and  triceps.  Hoffmans: negative b/l Sensory exam: revealed normal sensation in all dermatomal regions in bilateral upper extremities and bilateral lower extremities Motor exam: strength 5/5 throughout bilateral upper extremities and bilateral lower extremities Coordination: Fine motor coordination was normal.      Results for orders placed or performed during the hospital encounter of 12/15/23 (from the past 24 hours)  Glucose, capillary     Status: Abnormal   Collection Time: 12/21/23 12:36 PM  Result Value Ref Range   Glucose-Capillary 346 (H) 70 - 99 mg/dL  Glucose, capillary     Status: Abnormal   Collection Time: 12/21/23  5:06 PM  Result Value Ref Range   Glucose-Capillary 296 (H) 70 - 99 mg/dL  Glucose, capillary     Status: Abnormal   Collection Time: 12/21/23  9:04 PM  Result Value Ref Range   Glucose-Capillary 218 (H) 70 - 99 mg/dL   Comment 1 Notify RN    Comment 2 Document in Chart   Basic metabolic panel     Status: Abnormal   Collection Time: 12/22/23  3:22 AM  Result Value Ref Range   Sodium 134 (L) 135 - 145 mmol/L   Potassium 4.0 3.5 - 5.1 mmol/L   Chloride  99 98 - 111 mmol/L   CO2 28 22 - 32 mmol/L   Glucose, Bld 193 (H) 70 - 99 mg/dL   BUN 16 8 - 23 mg/dL   Creatinine, Ser 2.84 0.61 - 1.24 mg/dL   Calcium  8.3 (L) 8.9 - 10.3 mg/dL   GFR, Estimated >13 >24 mL/min   Anion gap 7 5 - 15  CBC     Status: Abnormal   Collection Time: 12/22/23  3:22 AM  Result Value Ref Range   WBC 9.5 4.0 - 10.5 K/uL   RBC 3.18 (L) 4.22 - 5.81 MIL/uL   Hemoglobin 9.8 (L) 13.0 - 17.0 g/dL   HCT 40.1 (L) 02.7 - 25.3 %   MCV 94.7 80.0 - 100.0 fL   MCH 30.8 26.0 - 34.0 pg   MCHC 32.6 30.0 - 36.0 g/dL   RDW 66.4 40.3 - 47.4 %   Platelets 418 (H) 150 - 400 K/uL   nRBC 0.0 0.0 - 0.2 %  Magnesium      Status: None   Collection Time: 12/22/23  3:22 AM  Result Value Ref Range   Magnesium  2.0 1.7 - 2.4 mg/dL  Phosphorus     Status: None   Collection Time: 12/22/23  3:22 AM  Result  Value Ref Range   Phosphorus 3.1 2.5 - 4.6 mg/dL  Glucose, capillary     Status: Abnormal   Collection Time: 12/22/23  6:20 AM  Result Value Ref Range   Glucose-Capillary 163 (H) 70 - 99 mg/dL   No results found.  Assessment/Plan: Diagnosis: Right BKA due to osteomyelitis, complicated by prior left BKA Does the need for close, 24 hr/day medical supervision in concert with the patient's rehab needs make it unreasonable for this patient to be served in a less intensive setting? Yes Co-Morbidities requiring supervision/potential complications:  uncontrolled diabetes , AKI on CKD stage II,  postop wound care, poor pain control managed with IV Dilaudid , constipation, and history of CVA on Plavix  pending restart.  Due to bowel management, safety, skin/wound care, disease management, medication administration, pain management, and patient education, does the patient require 24 hr/day rehab nursing? Yes Does the patient require coordinated care of a physician, rehab nurse, therapy disciplines of PT, OT to address physical and functional deficits in the context of the above medical diagnosis(es)? Yes Addressing deficits in the following areas: balance, endurance, locomotion, strength, transferring, bathing, dressing, feeding, grooming, and toileting Can the patient actively participate in an intensive therapy program of at least 3 hrs of therapy per day at least 5 days per week? Yes The potential for patient to make measurable gains while on inpatient rehab is good Anticipated functional outcomes upon discharge from inpatient rehab are modified independent  with PT, modified independent with OT,   Estimated rehab length of stay to reach the above functional goals is: 5-7 days Anticipated discharge destination: Home Overall Rehab/Functional Prognosis: good  POST ACUTE RECOMMENDATIONS: This patient's condition is appropriate for continued rehabilitative care in the following setting: CIR Patient has  agreed to participate in recommended program. Yes Note that insurance prior authorization may be required for reimbursement for recommended care.  Comment: Mr. Vergil Glasser is a 78 year old male with prior left BKA who was independent with a prosthetic and fairly active, who now presents with a new right BKA due to osteomyelitis.  His hospitalization has been complicated by postop wound management, poor pain control, uncontrolled diabetes, and constipation.  He is extremely motivated and could benefit from skilled PT/OT intervention in  an inpatient setting to practice bearing weight solely on his left prosthetic to allow independent transfers with a RW and ADLs.   I have personally performed a face to face diagnostic evaluation of this patient. Additionally, I have examined the patient's medical record including any pertinent labs and radiographic images. If the physician assistant has documented in this note, I have reviewed and edited or otherwise concur with the physician assistant's documentation.  Thanks,  Bea Lime, DO 12/22/2023

## 2023-12-22 NOTE — Progress Notes (Signed)
 PROGRESS NOTE  Kevin Bradford NWG:[id number] DOB: 10-20-1945 DOA: 12/15/2023 PCP: Jearldine Mina, MD   LOS: 6 days   Brief narrative:  Kevin Bradford is a 78 y.o. male with past medical history significant for poorly controlled diabetes mellitus, history of CVA, CKD stage II, and history of osteomyelitis status post left BKA presented to the hospital with fever and heel wound.  Had temperature of 102 F at home.  Family also noted to have foul-smelling discharge from the wound.  In the ED, patient was afebrile.  Had transient tachycardia and mild hypotension.  Creatinine was elevated at 1.4.  WBC elevated at 16, 900.  Lactic acid was 1.1.  X-ray of the right foot without any acute osteomyelitis.   Blood cultures were collected, MRI was obtained, patient was given Unasyn and linezolid and was at the hospital for further evaluation treatment.   Assessment/Plan: Principal Problem:   Diabetic infection of right foot (HCC) Active Problems:   CKD (chronic kidney disease) stage 2, GFR 60-89 ml/min   Type 2 diabetes mellitus with peripheral neuropathy (HCC)   History of stroke   Acute osteomyelitis of right calcaneus (HCC)   Cutaneous abscess of right foot  Diabetic right foot infection with abscess and calcaneal osteomyelitis status post right below-knee amputation 12/20/2023  MRI of the foot on the right shows soft tissue ulceration with gas and signal intensity compatible with calcaneal osteomyelitis with fluid collection possibility of abscess and tenosynovitis.  Dr Julio Ohm from orthopedics saw the patient and patient underwent right below knee amputation on 12/20/2023.Kevin Bradford Blood cultures negative in 5 days.  Off IV antibiotics at this time.  PT has been consulted.  Plan for CIR.  Insulin -dependent DM with hyperglycemia. Review of latest hemoglobin A1c was 13.7 in May 2025.  Continue sliding scale insulin  Accu-Cheks.  Latest POC glucose of 345.  Patient was on Levemir  at home and will no longer have it due to  manufacture problem as per diabetic coordinator.  Will need to give prescription for Lantus  at discharge so that patient will continue to get his supply.  Insulin  regimen has been adjusted during hospitalization.  CKD stage II with mild AKI. Creatinine today at 1.2 from 1.4.  Will continue to monitor.  Received hydration. Baseline creatinine of 1.07   Hx of CVA  Has been restarted on Plavix  since 12/21/2023  DVT prophylaxis: enoxaparin  (LOVENOX ) injection 40 mg Start: 12/21/23 1115 SCD's Start: 12/20/23 1434 SCDs Start: 12/16/23 0346   Disposition:  Plan for CIR as per PT OT.  Medically stable for disposition  Status is: Inpatient  Remains inpatient appropriate because: IV antibiotic, calcaneal abscess/osteomyelitis, status post right below-knee amputation, plan for CIR    Code Status:     Code Status: Full Code  Family Communication: Spoke with the patient's spouse at bedside on 12/20/2023  Consultants: Orthopedics Dr Julio Ohm  Procedures: Right below-knee amputation 12/20/2023  Anti-infectives:  None at this time   Subjective: Today, patient was seen and examined at bedside.  Patient complains of mild discomfort but no overt symptoms.  Denies any shortness of breath dyspnea chest pain, has had bowel movements.  Appetite okay.  Vitals:   12/22/23 0446 12/22/23 0815  BP: 109/60 106/65  Pulse: (!) 58 76  Resp: 20   Temp: 97.9 F (36.6 C) 97.7 F (36.5 C)  SpO2: 96% 93%   No intake or output data in the 24 hours ending 12/22/23 1054  Filed Weights   12/20/23 1107 12/21/23 0317 12/21/23 2326  Weight:  82.7 kg 84.5 kg 81.4 kg   Body mass index is 23.04 kg/m.   Physical Exam:  General:  Average built, not in obvious distress, elderly male, Communicative, HENT:   Oral mucosa moist. Chest-clear breath sounds. CVS: S1 &S2 heard. No murmur.  Regular rate and rhythm. Abdomen: Soft, nontender, nondistended.  Bowel sounds are heard.   Extremities: Right below-knee  amputation with stump  guard and wound VAC.  Left previous below-knee amputation. Psych: Alert, awake and oriented, normal mood CNS:  No cranial nerve deficits.  Power equal in all extremities.   Skin: Warm and dry.  Right lower extremity with wound VAC and stump guard   Data Review: I have personally reviewed the following laboratory data and studies,  CBC: Recent Labs  Lab 12/15/23 2343 12/16/23 0345 12/18/23 0408 12/19/23 0327 12/20/23 0309 12/21/23 0304 12/22/23 0322  WBC 16.9*   < > 13.1* 11.4* 11.1* 9.5 9.5  NEUTROABS 14.6*  --   --   --  9.2*  --   --   HGB 12.2*   < > 10.5* 11.1* 9.9* 10.1* 9.8*  HCT 36.6*   < > 32.7* 34.4* 30.0* 30.5* 30.1*  MCV 91.3   < > 94.8 94.2 92.3 94.4 94.7  PLT 453*   < > 450* 454* 415* 388 418*   < > = values in this interval not displayed.   Basic Metabolic Panel: Recent Labs  Lab 12/17/23 0358 12/18/23 0408 12/19/23 0327 12/20/23 0309 12/21/23 0304 12/22/23 0322  NA 133* 131* 134* 133* 132* 134*  K 4.1 4.0 3.7 3.9 4.3 4.0  CL 102 97* 98 97* 97* 99  CO2 22 22 25 25 22 28   GLUCOSE 179* 163* 179* 167* 331* 193*  BUN 15 12 8 8 19 16   CREATININE 1.07 1.14 1.10 1.07 1.27* 1.22  CALCIUM  8.5* 8.6* 8.5* 8.5* 8.4* 8.3*  MG 1.9  --   --   --  2.0 2.0  PHOS 3.7  --   --   --   --  3.1   Liver Function Tests: Recent Labs  Lab 12/15/23 2343 12/20/23 0309  AST 40 28  ALT 26 24  ALKPHOS 117 112  BILITOT 1.3* 1.1  PROT 7.1 5.7*  ALBUMIN 2.8* 1.9*   No results for input(s): "LIPASE", "AMYLASE" in the last 168 hours. No results for input(s): "AMMONIA" in the last 168 hours. Cardiac Enzymes: No results for input(s): "CKTOTAL", "CKMB", "CKMBINDEX", "TROPONINI" in the last 168 hours. BNP (last 3 results) No results for input(s): "BNP" in the last 8760 hours.  ProBNP (last 3 results) No results for input(s): "PROBNP" in the last 8760 hours.  CBG: Recent Labs  Lab 12/21/23 0632 12/21/23 1236 12/21/23 1706 12/21/23 2104  12/22/23 0620  GLUCAP 345* 346* 296* 218* 163*   Recent Results (from the past 240 hours)  Culture, blood (Routine x 2)     Status: None   Collection Time: 12/15/23 11:43 PM   Specimen: BLOOD RIGHT HAND  Result Value Ref Range Status   Specimen Description BLOOD RIGHT HAND  Final   Special Requests   Final    BOTTLES DRAWN AEROBIC AND ANAEROBIC Blood Culture results may not be optimal due to an inadequate volume of blood received in culture bottles   Culture   Final    NO GROWTH 5 DAYS Performed at [facility], [address], [place], [place] [address]    Report Status 12/21/2023 FINAL  Final  Culture, blood (  Routine x 2)     Status: None   Collection Time: 12/15/23 11:43 PM   Specimen: BLOOD RIGHT HAND  Result Value Ref Range Status   Specimen Description BLOOD RIGHT HAND  Final   Special Requests   Final    BOTTLES DRAWN AEROBIC ONLY Blood Culture adequate volume   Culture   Final    NO GROWTH 5 DAYS Performed at [facility], [address] [place], [place] [address]    Report Status 12/21/2023 FINAL  Final  Surgical PCR screen     Status: None   Collection Time: 12/19/23  7:50 PM   Specimen: Nasal Mucosa; Nasal Swab  Result Value Ref Range Status   MRSA, PCR NEGATIVE NEGATIVE Final   Staphylococcus aureus NEGATIVE NEGATIVE Final    Comment: (NOTE) The Xpert SA Assay (FDA approved for NASAL specimens in patients 26 years of age and older), is one component of a comprehensive surveillance program. It is not intended to diagnose infection nor to guide or monitor treatment. Performed at [facility], [address] [place], [place] [address]      Studies: No results found.     Marcia Hartwell, MD  [facility] 12/22/2023  If 7PM-7AM, please contact night-coverage

## 2023-12-22 NOTE — Progress Notes (Signed)
 Physical Therapy Treatment Patient Details Name: Kevin Bradford MRN: [id number] DOB: 02-05-1946 Today's Date: 12/22/2023   History of Present Illness Patient 78 y.o. male presented to [facility] with Rt foot infection w/ abscess and calcaneal osteomyelitis. Pt now s/p Rt BKA on 12/20/23. PMH significant for poorly controlled DM, CVA, CKD stage III, and hx of osteomyelitis status post Lt BKA and revision in 2019.    PT Comments  Patient resting in bed and eager to attempt mobilizing to [facility]. Drop arm bari BSC provided and min assist to move supine>sit and obtain balance at EOB. Pt required min-mod assist with cues for abdominal activation to maintain balance while donning Lt prosthesis. Pt completed lateral scoot transfer bed>BSC with min+2 assist for safety. Cues needed for head:hip relationship to improve transfer direction. Pt required mod assist for lateral lean to pull down underwear while on BSC; pt unable to void B&B and despite encouragement to transfer to recliner pt requesting return to bed due to pain in Rt LE. Min-Mod assist for lateral scoot back to bed. Once pt stable and cue for anterior trunk lean provided pt abel to boost hips backwards at EOB and complete multiple scoots to move towards HOB. Patient repositioned in supine, provided urinal, call bell in reach and RN notified of request of pain meds. Will continue to progress pt as able during acute stay.     If plan is discharge home, recommend the following: Two people to help with walking and/or transfers;A lot of help with bathing/dressing/bathroom;Assistance with cooking/housework;Assist for transportation;Help with stairs or ramp for entrance   Can travel by private vehicle        Equipment Recommendations  None recommended by PT (defer to next venue)    Recommendations for Other Services Rehab consult     Precautions / Restrictions Precautions Precautions: Fall Recall of Precautions/Restrictions: Intact Precaution/Restrictions  Comments: denies falls ("I've never fallen"); wound vac to R LE Required Braces or Orthoses: Other Brace Other Brace: R LE limb protector Restrictions Weight Bearing Restrictions Per Provider Order: Yes RLE Weight Bearing Per Provider Order: Non weight bearing Other Position/Activity Restrictions: New R BKA     Mobility  Bed Mobility Overal bed mobility: Needs Assistance Bed Mobility: Supine to Sit, Sit to Supine, Rolling Rolling: Min assist   Supine to sit: Min assist, HOB elevated, Used rails Sit to supine: Min assist, HOB elevated, Used rails        Transfers Overall transfer level: Needs assistance Equipment used: None (with L LE prosthetic donned) Transfers: Bed to chair/wheelchair/BSC            Lateral/Scoot Transfers: From elevated surface, +2 safety/equipment, Min assist General transfer comment: min-mod +2 assist for lateral scoot bed<>BSC. no slide board available but would improve pt's ability to transfer.    Ambulation/Gait                   Stairs             Wheelchair Mobility     Tilt Bed    Modified Rankin (Stroke Patients Only)       Balance Overall balance assessment: Needs assistance Sitting-balance support: Single extremity supported, No upper extremity supported, Feet unsupported Sitting balance-Leahy Scale: Fair Sitting balance - Comments: Pt initially requiring CGA and UE support for balance  but quickly fading to Supervision sitting EOB with no UE support needed for static sitting   Standing balance support: Bilateral upper extremity supported, During functional activity, Reliant on assistive device for  balance Standing balance-Leahy Scale: Poor Standing balance comment: with L LE prostetic donned                            Communication Communication Communication: No apparent difficulties  Cognition Arousal: Alert Behavior During Therapy: WFL for tasks assessed/performed, Anxious (Pt with mild signs  of anxiety when attempting STS from EOB)   PT - Cognitive impairments: No apparent impairments                       PT - Cognition Comments: pt reports he told spouse and staff he knows he is not able to get to commode without PT/OT recalling from yesterday. wants to work on this today. Following commands: Intact      Cueing Cueing Techniques: Verbal cues, Visual cues  Exercises      General Comments        Pertinent Vitals/Pain Pain Assessment Pain Assessment: Faces Faces Pain Scale: No hurt Pain Location: R LE Pain Intervention(s): Monitored during session, Premedicated before session, Repositioned    Home Living                          Prior Function            PT Goals (current goals can now be found in the care plan section) Acute Rehab PT Goals Patient Stated Goal: regain independence PT Goal Formulation: With patient Time For Goal Achievement: 12/21/23 Potential to Achieve Goals: Good Progress towards PT goals: Progressing toward goals    Frequency    Min 3X/week      PT Plan      Co-evaluation PT/OT/SLP Co-Evaluation/Treatment: Yes Reason for Co-Treatment: For patient/therapist safety;To address functional/ADL transfers PT goals addressed during session: Balance;Mobility/safety with mobility OT goals addressed during session: ADL's and self-care      AM-PAC PT "6 Clicks" Mobility   Outcome Measure  Help needed turning from your back to your side while in a flat bed without using bedrails?: A Little Help needed moving from lying on your back to sitting on the side of a flat bed without using bedrails?: A Little Help needed moving to and from a bed to a chair (including a wheelchair)?: A Little Help needed standing up from a chair using your arms (e.g., wheelchair or bedside chair)?: Total Help needed to walk in hospital room?: Total Help needed climbing 3-5 steps with a railing? : Total 6 Click Score: 12    End of  Session Equipment Utilized During Treatment: Gait belt Activity Tolerance: Patient tolerated treatment well Patient left: in chair;with call bell/phone within reach;with chair alarm set;with family/visitor present Nurse Communication: Mobility status PT Visit Diagnosis: Unsteadiness on feet (R26.81);Other abnormalities of gait and mobility (R26.89);Muscle weakness (generalized) (M62.81);Difficulty in walking, not elsewhere classified (R26.2)     Time: 1301-1350 (-9 minutes for toileting) PT Time Calculation (min) (ACUTE ONLY): 49 min  Charges:    $Therapeutic Activity: 38-52 mins PT General Charges $$ ACUTE PT VISIT: 1 Visit                     Tish Forge, DPT Acute Rehabilitation Services Office [phone]  12/22/23 3:48 PM

## 2023-12-22 NOTE — Plan of Care (Signed)
  Problem: Education: Goal: Ability to describe self-care measures that may prevent or decrease complications (Diabetes Survival Skills Education) will improve Outcome: Progressing   Problem: Coping: Goal: Ability to adjust to condition or change in health will improve Outcome: Progressing   Problem: Fluid Volume: Goal: Ability to maintain a balanced intake and output will improve Outcome: Progressing   Problem: Metabolic: Goal: Ability to maintain appropriate glucose levels will improve Outcome: Progressing   Problem: Nutritional: Goal: Maintenance of adequate nutrition will improve Outcome: Progressing   Problem: Skin Integrity: Goal: Risk for impaired skin integrity will decrease Outcome: Progressing   Problem: Education: Goal: Knowledge of General Education information will improve Description: Including pain rating scale, medication(s)/side effects and non-pharmacologic comfort measures Outcome: Progressing   Problem: Health Behavior/Discharge Planning: Goal: Ability to manage health-related needs will improve Outcome: Progressing   Problem: Clinical Measurements: Goal: Ability to maintain clinical measurements within normal limits will improve Outcome: Progressing Goal: Will remain free from infection Outcome: Progressing Goal: Diagnostic test results will improve Outcome: Progressing Goal: Respiratory complications will improve Outcome: Progressing Goal: Cardiovascular complication will be avoided Outcome: Progressing   Problem: Activity: Goal: Risk for activity intolerance will decrease Outcome: Progressing   Problem: Nutrition: Goal: Adequate nutrition will be maintained Outcome: Progressing   Problem: Coping: Goal: Level of anxiety will decrease Outcome: Progressing   Problem: Elimination: Goal: Will not experience complications related to urinary retention Outcome: Progressing   Problem: Pain Managment: Goal: General experience of comfort will  improve and/or be controlled Outcome: Progressing   Problem: Safety: Goal: Ability to remain free from injury will improve Outcome: Progressing   Problem: Skin Integrity: Goal: Risk for impaired skin integrity will decrease Outcome: Progressing   Problem: Elimination: Goal: Will not experience complications related to bowel motility Outcome: Not Progressing

## 2023-12-23 DIAGNOSIS — L089 Local infection of the skin and subcutaneous tissue, unspecified: Secondary | ICD-10-CM | POA: Diagnosis not present

## 2023-12-23 DIAGNOSIS — E11628 Type 2 diabetes mellitus with other skin complications: Secondary | ICD-10-CM | POA: Diagnosis not present

## 2023-12-23 LAB — BASIC METABOLIC PANEL WITH GFR
Anion gap: 6 (ref 5–15)
BUN: 14 mg/dL (ref 8–23)
CO2: 28 mmol/L (ref 22–32)
Calcium: 7.6 mg/dL — ABNORMAL LOW (ref 8.9–10.3)
Chloride: 98 mmol/L (ref 98–111)
Creatinine, Ser: 1.12 mg/dL (ref 0.61–1.24)
GFR, Estimated: >60 mL/min (ref >60)
Glucose, Bld: 109 mg/dL — ABNORMAL HIGH (ref 70–99)
Potassium: 4 mmol/L (ref 3.5–5.1)
Sodium: 132 mmol/L — ABNORMAL LOW (ref 135–145)

## 2023-12-23 LAB — CBC
HCT: 30.4 % — ABNORMAL LOW (ref 39.0–52.0)
Hemoglobin: 9.9 g/dL — ABNORMAL LOW (ref 13.0–17.0)
MCH: 30.7 pg (ref 26.0–34.0)
MCHC: 32.6 g/dL (ref 30.0–36.0)
MCV: 94.4 fL (ref 80.0–100.0)
Platelets: 405 10*3/uL — ABNORMAL HIGH (ref 150–400)
RBC: 3.22 MIL/uL — ABNORMAL LOW (ref 4.22–5.81)
RDW: 12.2 % (ref 11.5–15.5)
WBC: 6.1 10*3/uL (ref 4.0–10.5)
nRBC: 0 % (ref 0.0–0.2)

## 2023-12-23 LAB — GLUCOSE, CAPILLARY
Glucose-Capillary: 111 mg/dL — ABNORMAL HIGH (ref 70–99)
Glucose-Capillary: 125 mg/dL — ABNORMAL HIGH (ref 70–99)
Glucose-Capillary: 89 mg/dL (ref 70–99)
Glucose-Capillary: 130 mg/dL — ABNORMAL HIGH (ref 70–99)

## 2023-12-23 LAB — MAGNESIUM: Magnesium: 2 mg/dL (ref 1.7–2.4)

## 2023-12-23 NOTE — Progress Notes (Signed)
 Patient ID: Kevin Bradford, male   DOB: 13-Jul-1946, 78 y.o.   MRN: [id number] Patient is postoperative day 3 right below-knee amputation.  There is no drainage in the wound VAC canister this has a good suction fit.  Patient has a peel and placed dressing.  Possible discharge to inpatient rehab.    Please discontinue the wound VAC dressing at time of discharge from the hospital.

## 2023-12-23 NOTE — Progress Notes (Addendum)
 PROGRESS NOTE    Kevin Bradford  QMV:[id number] DOB: [birth year] DOA: 12/15/2023 PCP: Jearldine Mina, MD     Brief Narrative:  Kevin Bradford is a 78 y.o. male with past medical history significant for poorly controlled diabetes mellitus, history of CVA, CKD stage II, and history of osteomyelitis status post left BKA presented to the hospital with fever and heel wound.  Had temperature of 102 F at home.  Family also noted to have foul-smelling discharge from the wound.  In the ED, patient was afebrile.  Had transient tachycardia and mild hypotension.  Creatinine was elevated at 1.4.  WBC elevated at 16, 900.  Lactic acid was 1.1.  X-ray of the right foot without any acute osteomyelitis.   Blood cultures were collected, MRI was obtained, patient was given Unasyn  and linezolid  and was at the hospital for further evaluation treatment.    New events last 24 hours / Subjective: Patient reports feeling well overall.  Slight pain but nothing significant.  Has not had a bowel movement but he is not terribly concerned with that.  Working with physical therapy.  He has been dealing with a cough.  Assessment & Plan:   Principal Problem:   Diabetic infection of right foot (HCC)  S/P BKA w ortho, appreciate their assistance  Possible CIR d/c, appreciate TOC team  DC wound vac at time of DC  Tramadol , Dilaudid  as needed   Active Problems:   CKD (chronic kidney disease) stage 2, GFR 60-89 ml/min  Monitor    Type 2 diabetes mellitus with peripheral neuropathy (HCC)  Basal bolus insulin      History of stroke  Plavix  75 mg daily    Acute osteomyelitis of right calcaneus (HCC)  As above    Acute cough  Suspect atelectasis- rec'd IS which he politely declined for now, states cough not that bothersome  Nutrition Problem: Increased nutrient needs Etiology: wound healing   DVT prophylaxis: Lovenox  Code Status: Full Family Communication: Wife bedside Coming From: Home Disposition Plan:  CIR Barriers to Discharge: Cir approval  Consultants:  Ortho  Procedures:  BKA  Antimicrobials:  Anti-infectives (From admission, onward)    Start     Dose/Rate Route Frequency Ordered Stop   12/20/23 1000  ceFAZolin  (ANCEF ) IVPB 2g/100 mL premix        2 g 200 mL/hr over 30 Minutes Intravenous On call to O.R. 12/19/23 2322 12/20/23 1320   12/16/23 1200  linezolid  (ZYVOX ) tablet 600 mg       Placed in "And" Linked Group   600 mg Oral Every 12 hours 12/16/23 0347 12/21/23 0952   12/16/23 0600  Ampicillin -Sulbactam (UNASYN ) 3 g in sodium chloride  0.9 % 100 mL IVPB        3 g 200 mL/hr over 30 Minutes Intravenous Every 6 hours 12/16/23 0348 12/21/23 2359   12/16/23 0045  Ampicillin -Sulbactam (UNASYN ) 3 g in sodium chloride  0.9 % 100 mL IVPB       Placed in "And" Linked Group   3 g 200 mL/hr over 30 Minutes Intravenous  Once 12/16/23 0036 12/16/23 0127   12/16/23 0045  linezolid  (ZYVOX ) IVPB 600 mg       Placed in "And" Linked Group   600 mg 300 mL/hr over 60 Minutes Intravenous  Once 12/16/23 0036 12/16/23 0319        Objective: Vitals:   12/23/23 0322 12/23/23 0435 12/23/23 0840 12/23/23 1225  BP: 134/69  134/71 116/68  Pulse: 69  68 74  Resp: 20  16 16  Temp: 98.4 F (36.9 C)  98.6 F (37 C) 97.7 F (36.5 C)  TempSrc: Oral  Oral Oral  SpO2: 97%  98% 97%  Weight:  84.8 kg    Height:        Intake/Output Summary (Last 24 hours) at 12/23/2023 1420 Last data filed at 12/23/2023 0823 Gross per 24 hour  Intake --  Output 800 ml  Net -800 ml   Filed Weights   12/21/23 2326 12/22/23 2310 12/23/23 0435  Weight: 81.4 kg 82.6 kg 84.8 kg    Examination:  General exam: Appears calm and comfortable  Respiratory system: Clear to auscultation. Respiratory effort normal. No respiratory distress. No conversational dyspnea.  Cardiovascular system: S1 & S2 heard, RRR. Gastrointestinal system: Abdomen is nondistended, soft and nontender. Normal bowel sounds heard. Central  nervous system: Alert and oriented. No focal neurological deficits. Speech clear.  Extremities: R BKA noted, L BKA noted Skin: No rashes, lesions or ulcers on exposed skin otherwise Psychiatry: Judgement and insight appear normal. Mood & affect appropriate.   Data Reviewed: I have personally reviewed following labs and imaging studies  CBC: Recent Labs  Lab 12/19/23 0327 12/20/23 0309 12/21/23 0304 12/22/23 0322 12/23/23 0253  WBC 11.4* 11.1* 9.5 9.5 6.1  NEUTROABS  --  9.2*  --   --   --   HGB 11.1* 9.9* 10.1* 9.8* 9.9*  HCT 34.4* 30.0* 30.5* 30.1* 30.4*  MCV 94.2 92.3 94.4 94.7 94.4  PLT 454* 415* 388 418* 405*   Basic Metabolic Panel: Recent Labs  Lab 12/17/23 0358 12/18/23 0408 12/19/23 0327 12/20/23 0309 12/21/23 0304 12/22/23 0322 12/23/23 0253  NA 133*   < > 134* 133* 132* 134* 132*  K 4.1   < > 3.7 3.9 4.3 4.0 4.0  CL 102   < > 98 97* 97* 99 98  CO2 22   < > 25 25 22 28 28   GLUCOSE 179*   < > 179* 167* 331* 193* 109*  BUN 15   < > 8 8 19 16 14   CREATININE 1.07   < > 1.10 1.07 1.27* 1.22 1.12  CALCIUM  8.5*   < > 8.5* 8.5* 8.4* 8.3* 7.6*  MG 1.9  --   --   --  2.0 2.0 2.0  PHOS 3.7  --   --   --   --  3.1  --    < > = values in this interval not displayed.   GFR: Estimated Creatinine Clearance: 63.2 mL/min (by C-G formula based on SCr of 1.12 mg/dL). Liver Function Tests: Recent Labs  Lab 12/20/23 0309  AST 28  ALT 24  ALKPHOS 112  BILITOT 1.1  PROT 5.7*  ALBUMIN 1.9*   CBG: Recent Labs  Lab 12/22/23 1141 12/22/23 1708 12/22/23 2114 12/23/23 0616 12/23/23 1222  GLUCAP 115* 166* 114* 111* 125*   Recent Results (from the past 240 hours)  Culture, blood (Routine x 2)     Status: None   Collection Time: 12/15/23 11:43 PM   Specimen: BLOOD RIGHT HAND  Result Value Ref Range Status   Specimen Description BLOOD RIGHT HAND  Final   Special Requests   Final    BOTTLES DRAWN AEROBIC AND ANAEROBIC Blood Culture results may not be optimal due to an  inadequate volume of blood received in culture bottles   Culture   Final    NO GROWTH 5 DAYS Performed at [facility], [address], [place], [place]  [address]    Report Status 12/21/2023 FINAL  Final  Culture, blood (Routine x 2)     Status: None   Collection Time: 12/15/23 11:43 PM   Specimen: BLOOD RIGHT HAND  Result Value Ref Range Status   Specimen Description BLOOD RIGHT HAND  Final   Special Requests   Final    BOTTLES DRAWN AEROBIC ONLY Blood Culture adequate volume   Culture   Final    NO GROWTH 5 DAYS Performed at [facility], [address], [place], [place] [address]    Report Status 12/21/2023 FINAL  Final  Surgical PCR screen     Status: None   Collection Time: 12/19/23  7:50 PM   Specimen: Nasal Mucosa; Nasal Swab  Result Value Ref Range Status   MRSA, PCR NEGATIVE NEGATIVE Final   Staphylococcus aureus NEGATIVE NEGATIVE Final    Comment: (NOTE) The Xpert SA Assay (FDA approved for NASAL specimens in patients 73 years of age and older), is one component of a comprehensive surveillance program. It is not intended to diagnose infection nor to guide or monitor treatment. Performed at [facility], [address] [place], [place] [address]     Scheduled Meds:  ascorbic acid   500 mg Oral BID   clopidogrel   75 mg Oral Daily   docusate sodium   100 mg Oral Daily   enoxaparin  (LOVENOX ) injection  40 mg Subcutaneous Daily   insulin  aspart  0-9 Units Subcutaneous TID AC & HS   insulin  aspart  6 Units Subcutaneous TID WC   insulin  glargine-yfgn  25 Units Subcutaneous QHS   melatonin  3 mg Oral QHS   multivitamin with minerals  1 tablet Oral Daily   mupirocin  ointment  1 Application Nasal BID   pantoprazole   40 mg Oral Daily   Ensure Max Protein  11 oz Oral BID   sodium chloride  flush  3 mL Intravenous Q12H   traMADol   50 mg Oral Q6H   zinc  sulfate (50mg  elemental zinc )  220 mg Oral Daily   Continuous Infusions:  magnesium  sulfate bolus IVPB        LOS: 7 days    Time spent: 30 minutes   Jobe Mulder, DO [facility] 12/23/2023, 2:20 PM   Available via Epic secure chat 7am-7pm After these hours, please refer to coverage provider listed on [email or web address]

## 2023-12-23 NOTE — Plan of Care (Signed)
  Problem: Nutritional: Goal: Maintenance of adequate nutrition will improve Outcome: Progressing Goal: Progress toward achieving an optimal weight will improve Outcome: Progressing   Problem: Skin Integrity: Goal: Risk for impaired skin integrity will decrease Outcome: Progressing   Problem: Education: Goal: Knowledge of General Education information will improve Description: Including pain rating scale, medication(s)/side effects and non-pharmacologic comfort measures Outcome: Progressing   Problem: Health Behavior/Discharge Planning: Goal: Ability to manage health-related needs will improve Outcome: Progressing   Problem: Clinical Measurements: Goal: Ability to maintain clinical measurements within normal limits will improve Outcome: Progressing Goal: Will remain free from infection Outcome: Progressing Goal: Diagnostic test results will improve Outcome: Progressing Goal: Respiratory complications will improve Outcome: Progressing Goal: Cardiovascular complication will be avoided Outcome: Progressing   Problem: Activity: Goal: Risk for activity intolerance will decrease Outcome: Progressing   Problem: Nutrition: Goal: Adequate nutrition will be maintained Outcome: Progressing   Problem: Elimination: Goal: Will not experience complications related to urinary retention Outcome: Progressing   Problem: Pain Managment: Goal: General experience of comfort will improve and/or be controlled Outcome: Progressing   Problem: Elimination: Goal: Will not experience complications related to bowel motility Outcome: Not Progressing

## 2023-12-24 DIAGNOSIS — E11628 Type 2 diabetes mellitus with other skin complications: Secondary | ICD-10-CM | POA: Diagnosis not present

## 2023-12-24 DIAGNOSIS — L089 Local infection of the skin and subcutaneous tissue, unspecified: Secondary | ICD-10-CM | POA: Diagnosis not present

## 2023-12-24 LAB — GLUCOSE, CAPILLARY
Glucose-Capillary: 89 mg/dL (ref 70–99)
Glucose-Capillary: 121 mg/dL — ABNORMAL HIGH (ref 70–99)
Glucose-Capillary: 138 mg/dL — ABNORMAL HIGH (ref 70–99)
Glucose-Capillary: 138 mg/dL — ABNORMAL HIGH (ref 70–99)

## 2023-12-24 LAB — CBC
HCT: 30.6 % — ABNORMAL LOW (ref 39.0–52.0)
Hemoglobin: 9.8 g/dL — ABNORMAL LOW (ref 13.0–17.0)
MCH: 30.2 pg (ref 26.0–34.0)
MCHC: 32 g/dL (ref 30.0–36.0)
MCV: 94.4 fL (ref 80.0–100.0)
Platelets: 367 10*3/uL (ref 150–400)
RBC: 3.24 MIL/uL — ABNORMAL LOW (ref 4.22–5.81)
RDW: 12.4 % (ref 11.5–15.5)
WBC: 4.6 10*3/uL (ref 4.0–10.5)
nRBC: 0 % (ref 0.0–0.2)

## 2023-12-24 LAB — BASIC METABOLIC PANEL WITH GFR
Anion gap: 9 (ref 5–15)
BUN: 10 mg/dL (ref 8–23)
CO2: 27 mmol/L (ref 22–32)
Calcium: 8.3 mg/dL — ABNORMAL LOW (ref 8.9–10.3)
Chloride: 100 mmol/L (ref 98–111)
Creatinine, Ser: 1.09 mg/dL (ref 0.61–1.24)
GFR, Estimated: >60 mL/min (ref >60)
Glucose, Bld: 93 mg/dL (ref 70–99)
Potassium: 4.2 mmol/L (ref 3.5–5.1)
Sodium: 136 mmol/L (ref 135–145)

## 2023-12-24 MED ORDER — HYDROMORPHONE HCL 1 MG/ML IJ SOLN
0.5000 mg | INTRAMUSCULAR | Status: DC | PRN
  Administered 2023-12-24 – 2023-12-25 (×3): 0.5 mg via INTRAVENOUS
  Filled 2023-12-24 (×3): qty 0.5

## 2023-12-24 NOTE — Plan of Care (Signed)
  Problem: Education: Goal: Ability to describe self-care measures that may prevent or decrease complications (Diabetes Survival Skills Education) will improve Outcome: Progressing   Problem: Coping: Goal: Ability to adjust to condition or change in health will improve Outcome: Progressing   Problem: Health Behavior/Discharge Planning: Goal: Ability to manage health-related needs will improve Outcome: Progressing   Problem: Metabolic: Goal: Ability to maintain appropriate glucose levels will improve Outcome: Progressing   Problem: Education: Goal: Knowledge of General Education information will improve Description: Including pain rating scale, medication(s)/side effects and non-pharmacologic comfort measures Outcome: Progressing   Problem: Health Behavior/Discharge Planning: Goal: Ability to manage health-related needs will improve Outcome: Progressing   Problem: Clinical Measurements: Goal: Ability to maintain clinical measurements within normal limits will improve Outcome: Progressing Goal: Will remain free from infection Outcome: Progressing Goal: Diagnostic test results will improve Outcome: Progressing Goal: Respiratory complications will improve Outcome: Progressing Goal: Cardiovascular complication will be avoided Outcome: Progressing   Problem: Activity: Goal: Risk for activity intolerance will decrease Outcome: Progressing   Problem: Nutrition: Goal: Adequate nutrition will be maintained Outcome: Progressing   Problem: Coping: Goal: Level of anxiety will decrease Outcome: Progressing   Problem: Elimination: Goal: Will not experience complications related to urinary retention Outcome: Progressing   Problem: Elimination: Goal: Will not experience complications related to bowel motility Outcome: Not Progressing

## 2023-12-24 NOTE — Progress Notes (Signed)
 PROGRESS NOTE    Kevin Bradford  ZOX:[id number] DOB: February 25, 1946 DOA: 12/15/2023 PCP: Jearldine Mina, MD     Brief Narrative:  Kevin Bradford is a 78 y.o. male with past medical history significant for poorly controlled diabetes mellitus, history of CVA, CKD stage II, and history of osteomyelitis status post left BKA presented to the hospital with fever and heel wound.  Had temperature of 102 F at home.  Family also noted to have foul-smelling discharge from the wound.  In the ED, patient was afebrile.  Had transient tachycardia and mild hypotension.  Creatinine was elevated at 1.4.  WBC elevated at 16, 900.  Lactic acid was 1.1.  X-ray of the right foot without any acute osteomyelitis.   Blood cultures were collected, MRI was obtained, patient was given Unasyn  and linezolid  and was at the hospital for further evaluation treatment.   New events last 24 hours / Subjective: Patient reports feeling relatively well.  Pain medicine is helping.  Awaiting CIR approval.  Denies fevers, shortness of breath, vomiting.  Assessment & Plan:  Principal Problem:   Diabetic infection of right foot (HCC)             S/P BKA w ortho, appreciate their assistance             Possible CIR d/c, appreciate TOC team             DC wound vac at time of DC             Tramadol , Dilaudid  as needed              Active Problems:   CKD (chronic kidney disease) stage 2, GFR 60-89 ml/min             Monitor     Type 2 diabetes mellitus with peripheral neuropathy (HCC)             Basal bolus insulin                 History of stroke             Plavix  75 mg daily     Acute osteomyelitis of right calcaneus (HCC)             As above     Acute cough             Suspect atelectasis- rec'd IS which he politely declined for now, states cough not that bothersome   Nutrition Problem: Increased nutrient needs Etiology: wound healing     DVT prophylaxis: Lovenox  Code Status: Full Family Communication: none  bedside Coming From: Home Disposition Plan: CIR Barriers to Discharge: CIR approval   Consultants:  Ortho   Procedures:  BKA  Antimicrobials:  Anti-infectives (From admission, onward)    Start     Dose/Rate Route Frequency Ordered Stop   12/20/23 1000  ceFAZolin  (ANCEF ) IVPB 2g/100 mL premix        2 g 200 mL/hr over 30 Minutes Intravenous On call to O.R. 12/19/23 2322 12/20/23 1320   12/16/23 1200  linezolid  (ZYVOX ) tablet 600 mg       Placed in "And" Linked Group   600 mg Oral Every 12 hours 12/16/23 0347 12/21/23 0952   12/16/23 0600  Ampicillin -Sulbactam (UNASYN ) 3 g in sodium chloride  0.9 % 100 mL IVPB        3 g 200 mL/hr over 30 Minutes Intravenous Every 6 hours 12/16/23 0348 12/21/23 2359   12/16/23 0045  Ampicillin -Sulbactam (  UNASYN ) 3 g in sodium chloride  0.9 % 100 mL IVPB       Placed in "And" Linked Group   3 g 200 mL/hr over 30 Minutes Intravenous  Once 12/16/23 0036 12/16/23 0127   12/16/23 0045  linezolid  (ZYVOX ) IVPB 600 mg       Placed in "And" Linked Group   600 mg 300 mL/hr over 60 Minutes Intravenous  Once 12/16/23 0036 12/16/23 0319        Objective: Vitals:   12/24/23 0326 12/24/23 0500 12/24/23 0724 12/24/23 1240  BP: 119/64  (!) 108/91 116/75  Pulse: 70 74 76 73  Resp: 18  15 18   Temp: 98.4 F (36.9 C)  98.2 F (36.8 C) 98 F (36.7 C)  TempSrc: Oral  Oral Oral  SpO2: 99% 99% 98% 97%  Weight:  83.1 kg    Height:        Intake/Output Summary (Last 24 hours) at 12/24/2023 1357 Last data filed at 12/24/2023 0600 Gross per 24 hour  Intake --  Output 800 ml  Net -800 ml   Filed Weights   12/22/23 2310 12/23/23 0435 12/24/23 0500  Weight: 82.6 kg 84.8 kg 83.1 kg    Examination:  General exam: Appears calm and comfortable  Respiratory system: Clear to auscultation. Respiratory effort normal. No respiratory distress. No conversational dyspnea.  Cardiovascular system: S1 & S2 heard, RRR. No murmurs.  Gastrointestinal system: Abdomen is  nondistended, soft and nontender. Normal bowel sounds heard. Central nervous system: Alert and oriented. No focal neurological deficits. Speech clear.  Extremities: R and L BKA noted Skin: No rashes, lesions or ulcers on exposed skin  Psychiatry: Judgement and insight appear normal. Mood & affect appropriate.   Data Reviewed: I have personally reviewed following labs and imaging studies  CBC: Recent Labs  Lab 12/20/23 0309 12/21/23 0304 12/22/23 0322 12/23/23 0253 12/24/23 0324  WBC 11.1* 9.5 9.5 6.1 4.6  NEUTROABS 9.2*  --   --   --   --   HGB 9.9* 10.1* 9.8* 9.9* 9.8*  HCT 30.0* 30.5* 30.1* 30.4* 30.6*  MCV 92.3 94.4 94.7 94.4 94.4  PLT 415* 388 418* 405* 367   Basic Metabolic Panel: Recent Labs  Lab 12/20/23 0309 12/21/23 0304 12/22/23 0322 12/23/23 0253 12/24/23 0324  NA 133* 132* 134* 132* 136  K 3.9 4.3 4.0 4.0 4.2  CL 97* 97* 99 98 100  CO2 25 22 28 28 27   GLUCOSE 167* 331* 193* 109* 93  BUN 8 19 16 14 10   CREATININE 1.07 1.27* 1.22 1.12 1.09  CALCIUM  8.5* 8.4* 8.3* 7.6* 8.3*  MG  --  2.0 2.0 2.0  --   PHOS  --   --  3.1  --   --    GFR: Estimated Creatinine Clearance: 64.9 mL/min (by C-G formula based on SCr of 1.09 mg/dL).  Liver Function Tests: Recent Labs  Lab 12/20/23 0309  AST 28  ALT 24  ALKPHOS 112  BILITOT 1.1  PROT 5.7*  ALBUMIN 1.9*   CBG: Recent Labs  Lab 12/23/23 1222 12/23/23 1822 12/23/23 2127 12/24/23 0606 12/24/23 1229  GLUCAP 125* 89 130* 89 121*   Recent Results (from the past 240 hours)  Culture, blood (Routine x 2)     Status: None   Collection Time: 12/15/23 11:43 PM   Specimen: BLOOD RIGHT HAND  Result Value Ref Range Status   Specimen Description BLOOD RIGHT HAND  Final   Special Requests   Final  BOTTLES DRAWN AEROBIC AND ANAEROBIC Blood Culture results may not be optimal due to an inadequate volume of blood received in culture bottles   Culture   Final    NO GROWTH 5 DAYS Performed at [facility]  Lab, [address], [place], [place] [address]    Report Status 12/21/2023 FINAL  Final  Culture, blood (Routine x 2)     Status: None   Collection Time: 12/15/23 11:43 PM   Specimen: BLOOD RIGHT HAND  Result Value Ref Range Status   Specimen Description BLOOD RIGHT HAND  Final   Special Requests   Final    BOTTLES DRAWN AEROBIC ONLY Blood Culture adequate volume   Culture   Final    NO GROWTH 5 DAYS Performed at [facility], [address], [place], [place] [address]    Report Status 12/21/2023 FINAL  Final  Surgical PCR screen     Status: None   Collection Time: 12/19/23  7:50 PM   Specimen: Nasal Mucosa; Nasal Swab  Result Value Ref Range Status   MRSA, PCR NEGATIVE NEGATIVE Final   Staphylococcus aureus NEGATIVE NEGATIVE Final    Comment: (NOTE) The Xpert SA Assay (FDA approved for NASAL specimens in patients 39 years of age and older), is one component of a comprehensive surveillance program. It is not intended to diagnose infection nor to guide or monitor treatment. Performed at [facility], [address] [place], [place] [address]     Scheduled Meds:  ascorbic acid   500 mg Oral BID   clopidogrel   75 mg Oral Daily   docusate sodium   100 mg Oral Daily   enoxaparin  (LOVENOX ) injection  40 mg Subcutaneous Daily   insulin  aspart  0-9 Units Subcutaneous TID AC & HS   insulin  aspart  6 Units Subcutaneous TID WC   insulin  glargine-yfgn  25 Units Subcutaneous QHS   melatonin  3 mg Oral QHS   multivitamin with minerals  1 tablet Oral Daily   mupirocin  ointment  1 Application Nasal BID   pantoprazole   40 mg Oral Daily   Ensure Max Protein  11 oz Oral BID   sodium chloride  flush  3 mL Intravenous Q12H   traMADol   50 mg Oral Q6H   zinc  sulfate (50mg  elemental zinc )  220 mg Oral Daily   Continuous Infusions:  magnesium  sulfate bolus IVPB       LOS: 8 days    Time spent: 25 minutes   Jobe Mulder, DO [facility] 12/24/2023, 1:57 PM    Available via Epic secure chat 7am-7pm After these hours, please refer to coverage provider listed on [email or web address]

## 2023-12-25 ENCOUNTER — Encounter (HOSPITAL_COMMUNITY): Payer: Self-pay | Admitting: Physical Medicine and Rehabilitation

## 2023-12-25 ENCOUNTER — Other Ambulatory Visit: Payer: Self-pay

## 2023-12-25 ENCOUNTER — Inpatient Hospital Stay (HOSPITAL_COMMUNITY)
Admission: AD | Admit: 2023-12-25 | Discharge: 2024-01-06 | DRG: 560 | Disposition: A | Discharge status: Home Health | Source: Intra-hospital | Attending: Physical Medicine and Rehabilitation | Admitting: Physical Medicine and Rehabilitation

## 2023-12-25 DIAGNOSIS — N182 Chronic kidney disease, stage 2 (mild): Secondary | ICD-10-CM | POA: Diagnosis present

## 2023-12-25 DIAGNOSIS — E11621 Type 2 diabetes mellitus with foot ulcer: Secondary | ICD-10-CM | POA: Diagnosis not present

## 2023-12-25 DIAGNOSIS — E1165 Type 2 diabetes mellitus with hyperglycemia: Secondary | ICD-10-CM | POA: Diagnosis present

## 2023-12-25 DIAGNOSIS — Z8673 Personal history of transient ischemic attack (TIA), and cerebral infarction without residual deficits: Secondary | ICD-10-CM | POA: Diagnosis not present

## 2023-12-25 DIAGNOSIS — Z89511 Acquired absence of right leg below knee: Principal | ICD-10-CM | POA: Diagnosis present

## 2023-12-25 DIAGNOSIS — E1122 Type 2 diabetes mellitus with diabetic chronic kidney disease: Secondary | ICD-10-CM | POA: Diagnosis present

## 2023-12-25 DIAGNOSIS — Z89512 Acquired absence of left leg below knee: Secondary | ICD-10-CM | POA: Diagnosis not present

## 2023-12-25 DIAGNOSIS — R739 Hyperglycemia, unspecified: Secondary | ICD-10-CM | POA: Diagnosis not present

## 2023-12-25 DIAGNOSIS — K5901 Slow transit constipation: Secondary | ICD-10-CM | POA: Diagnosis not present

## 2023-12-25 DIAGNOSIS — Z4781 Encounter for orthopedic aftercare following surgical amputation: Principal | ICD-10-CM

## 2023-12-25 DIAGNOSIS — F54 Psychological and behavioral factors associated with disorders or diseases classified elsewhere: Secondary | ICD-10-CM

## 2023-12-25 DIAGNOSIS — E1100 Type 2 diabetes mellitus with hyperosmolarity without nonketotic hyperglycemic-hyperosmolar coma (NKHHC): Secondary | ICD-10-CM

## 2023-12-25 DIAGNOSIS — Z794 Long term (current) use of insulin: Secondary | ICD-10-CM | POA: Diagnosis not present

## 2023-12-25 DIAGNOSIS — K5900 Constipation, unspecified: Secondary | ICD-10-CM | POA: Diagnosis present

## 2023-12-25 DIAGNOSIS — G4700 Insomnia, unspecified: Secondary | ICD-10-CM | POA: Diagnosis present

## 2023-12-25 DIAGNOSIS — L97509 Non-pressure chronic ulcer of other part of unspecified foot with unspecified severity: Secondary | ICD-10-CM

## 2023-12-25 DIAGNOSIS — D62 Acute posthemorrhagic anemia: Secondary | ICD-10-CM | POA: Diagnosis present

## 2023-12-25 DIAGNOSIS — E1142 Type 2 diabetes mellitus with diabetic polyneuropathy: Secondary | ICD-10-CM | POA: Diagnosis present

## 2023-12-25 DIAGNOSIS — Z7902 Long term (current) use of antithrombotics/antiplatelets: Secondary | ICD-10-CM | POA: Diagnosis not present

## 2023-12-25 DIAGNOSIS — E440 Moderate protein-calorie malnutrition: Secondary | ICD-10-CM | POA: Insufficient documentation

## 2023-12-25 DIAGNOSIS — E11628 Type 2 diabetes mellitus with other skin complications: Secondary | ICD-10-CM | POA: Diagnosis not present

## 2023-12-25 DIAGNOSIS — M869 Osteomyelitis, unspecified: Secondary | ICD-10-CM | POA: Diagnosis not present

## 2023-12-25 DIAGNOSIS — Z7984 Long term (current) use of oral hypoglycemic drugs: Secondary | ICD-10-CM | POA: Diagnosis not present

## 2023-12-25 DIAGNOSIS — L089 Local infection of the skin and subcutaneous tissue, unspecified: Secondary | ICD-10-CM | POA: Diagnosis not present

## 2023-12-25 DIAGNOSIS — E11649 Type 2 diabetes mellitus with hypoglycemia without coma: Secondary | ICD-10-CM | POA: Diagnosis not present

## 2023-12-25 DIAGNOSIS — K219 Gastro-esophageal reflux disease without esophagitis: Secondary | ICD-10-CM | POA: Diagnosis present

## 2023-12-25 LAB — CBC
HCT: 30.5 % — ABNORMAL LOW (ref 39.0–52.0)
Hemoglobin: 9.6 g/dL — ABNORMAL LOW (ref 13.0–17.0)
MCH: 29.8 pg (ref 26.0–34.0)
MCHC: 31.5 g/dL (ref 30.0–36.0)
MCV: 94.7 fL (ref 80.0–100.0)
Platelets: 351 10*3/uL (ref 150–400)
RBC: 3.22 MIL/uL — ABNORMAL LOW (ref 4.22–5.81)
RDW: 12.3 % (ref 11.5–15.5)
WBC: 5.2 10*3/uL (ref 4.0–10.5)
nRBC: 0 % (ref 0.0–0.2)

## 2023-12-25 LAB — BASIC METABOLIC PANEL WITH GFR
Anion gap: 9 (ref 5–15)
BUN: 9 mg/dL (ref 8–23)
CO2: 25 mmol/L (ref 22–32)
Calcium: 8.4 mg/dL — ABNORMAL LOW (ref 8.9–10.3)
Chloride: 101 mmol/L (ref 98–111)
Creatinine, Ser: 1.07 mg/dL (ref 0.61–1.24)
GFR, Estimated: >60 mL/min (ref >60)
Glucose, Bld: 118 mg/dL — ABNORMAL HIGH (ref 70–99)
Potassium: 4.4 mmol/L (ref 3.5–5.1)
Sodium: 135 mmol/L (ref 135–145)

## 2023-12-25 LAB — GLUCOSE, CAPILLARY
Glucose-Capillary: 105 mg/dL — ABNORMAL HIGH (ref 70–99)
Glucose-Capillary: 140 mg/dL — ABNORMAL HIGH (ref 70–99)
Glucose-Capillary: 140 mg/dL — ABNORMAL HIGH (ref 70–99)
Glucose-Capillary: 135 mg/dL — ABNORMAL HIGH (ref 70–99)
Glucose-Capillary: 168 mg/dL — ABNORMAL HIGH (ref 70–99)

## 2023-12-25 MED ORDER — ENSURE PLUS HIGH PROTEIN PO LIQD: 237.0000 mL | Freq: Two times a day (BID) | ORAL | Status: DC

## 2023-12-25 MED ORDER — ENOXAPARIN SODIUM 40 MG/0.4ML IJ SOSY
40.0000 mg | PREFILLED_SYRINGE | Freq: Every day | INTRAMUSCULAR | Status: DC
  Administered 2023-12-26 – 2024-01-06 (×12): 40 mg via SUBCUTANEOUS
  Filled 2023-12-25 (×12): qty 0.4

## 2023-12-25 MED ORDER — INSULIN GLARGINE-YFGN 100 UNIT/ML ~~LOC~~ SOLN
25.0000 [IU] | Freq: Every day | SUBCUTANEOUS | Status: DC
  Administered 2023-12-25: 25 [IU] via SUBCUTANEOUS
  Filled 2023-12-25 (×2): qty 0.25

## 2023-12-25 MED ORDER — CLOPIDOGREL BISULFATE 75 MG PO TABS
75.0000 mg | ORAL_TABLET | Freq: Every day | ORAL | Status: DC
  Administered 2023-12-26 – 2024-01-06 (×12): 75 mg via ORAL
  Filled 2023-12-25 (×12): qty 1

## 2023-12-25 MED ORDER — ENOXAPARIN SODIUM 40 MG/0.4ML IJ SOSY: 40.0000 mg | PREFILLED_SYRINGE | INTRAMUSCULAR | Status: DC

## 2023-12-25 MED ORDER — GUAIFENESIN-DM 100-10 MG/5ML PO SYRP: 15.0000 mL | ORAL_SOLUTION | ORAL | 0 refills | Status: DC | PRN

## 2023-12-25 MED ORDER — PANTOPRAZOLE SODIUM 40 MG PO TBEC
40.0000 mg | DELAYED_RELEASE_TABLET | Freq: Every day | ORAL | Status: DC
  Administered 2023-12-26 – 2024-01-06 (×12): 40 mg via ORAL
  Filled 2023-12-25 (×12): qty 1

## 2023-12-25 MED ORDER — TRAMADOL HCL 50 MG PO TABS
50.0000 mg | ORAL_TABLET | Freq: Four times a day (QID) | ORAL | Status: DC
  Administered 2023-12-25 – 2023-12-31 (×20): 50 mg via ORAL
  Filled 2023-12-25 (×21): qty 1

## 2023-12-25 MED ORDER — ENSURE PLUS HIGH PROTEIN PO LIQD
237.0000 mL | Freq: Two times a day (BID) | ORAL | Status: DC
Start: 2023-12-26 — End: 2024-01-06
  Administered 2023-12-30 – 2024-01-02 (×3): 237 mL via ORAL

## 2023-12-25 MED ORDER — VITAMIN C 500 MG PO TABS
500.0000 mg | ORAL_TABLET | Freq: Two times a day (BID) | ORAL | Status: DC
  Administered 2023-12-25 – 2024-01-06 (×24): 500 mg via ORAL
  Filled 2023-12-25 (×24): qty 1

## 2023-12-25 MED ORDER — TRAMADOL HCL 50 MG PO TABS: 50.0000 mg | ORAL_TABLET | Freq: Four times a day (QID) | ORAL | 0 refills | Status: DC

## 2023-12-25 MED ORDER — DOCUSATE SODIUM 100 MG PO CAPS
100.0000 mg | ORAL_CAPSULE | Freq: Every day | ORAL | Status: DC
  Administered 2023-12-27 – 2024-01-06 (×11): 100 mg via ORAL
  Filled 2023-12-25 (×14): qty 1

## 2023-12-25 MED ORDER — ACETAMINOPHEN 325 MG PO TABS: 325.0000 mg | ORAL_TABLET | Freq: Four times a day (QID) | ORAL | Status: DC | PRN

## 2023-12-25 MED ORDER — MELATONIN 3 MG PO TABS
3.0000 mg | ORAL_TABLET | Freq: Every day | ORAL | Status: DC
  Administered 2023-12-25 – 2024-01-01 (×8): 3 mg via ORAL
  Filled 2023-12-25 (×8): qty 1

## 2023-12-25 MED ORDER — GUAIFENESIN-DM 100-10 MG/5ML PO SYRP: 15.0000 mL | ORAL_SOLUTION | ORAL | Status: DC | PRN

## 2023-12-25 MED ORDER — ZINC SULFATE 220 (50 ZN) MG PO CAPS
220.0000 mg | ORAL_CAPSULE | Freq: Every day | ORAL | Status: AC
  Administered 2023-12-26 – 2023-12-31 (×6): 220 mg via ORAL
  Filled 2023-12-25 (×6): qty 1

## 2023-12-25 MED ORDER — INSULIN ASPART 100 UNIT/ML IJ SOLN: 6.0000 [IU] | Freq: Three times a day (TID) | INTRAMUSCULAR | Status: DC

## 2023-12-25 MED ORDER — INSULIN ASPART 100 UNIT/ML IJ SOLN
0.0000 [IU] | Freq: Three times a day (TID) | INTRAMUSCULAR | Status: DC
  Administered 2023-12-25: 2 [IU] via SUBCUTANEOUS
  Administered 2023-12-26 – 2023-12-27 (×4): 1 [IU] via SUBCUTANEOUS

## 2023-12-25 MED ORDER — ADULT MULTIVITAMIN W/MINERALS CH
1.0000 | ORAL_TABLET | Freq: Every day | ORAL | Status: DC
Start: 2023-12-26 — End: 2024-01-06
  Administered 2023-12-26 – 2024-01-06 (×12): 1 via ORAL
  Filled 2023-12-25 (×12): qty 1

## 2023-12-25 MED ORDER — SENNOSIDES-DOCUSATE SODIUM 8.6-50 MG PO TABS
1.0000 | ORAL_TABLET | Freq: Every evening | ORAL | Status: DC | PRN
  Administered 2023-12-28: 1 via ORAL
  Filled 2023-12-25: qty 1

## 2023-12-25 NOTE — Plan of Care (Signed)
  Problem: Consults Goal: RH LIMB LOSS PATIENT EDUCATION Description: Description: See Patient Education module for eduction specifics. Outcome: Progressing   Problem: RH BOWEL ELIMINATION Goal: RH STG MANAGE BOWEL WITH ASSISTANCE Description: STG Manage Bowel with toileting Assistance. Outcome: Progressing Goal: RH STG MANAGE BOWEL W/MEDICATION W/ASSISTANCE Description: STG Manage Bowel with Medication with mod I  Assistance. Outcome: Progressing

## 2023-12-25 NOTE — Progress Notes (Signed)
 Mobility Specialist Progress Note:   12/25/23 1111  Mobility  Activity Transferred from chair to bed  Level of Assistance Moderate assist, patient does 50-74%  Assistive Device  (bed pad)  Activity Response Tolerated well  Mobility Referral Yes  Mobility visit 1 Mobility  Mobility Specialist Start Time (ACUTE ONLY) 1100  Mobility Specialist Stop Time (ACUTE ONLY) 1111  Mobility Specialist Time Calculation (min) (ACUTE ONLY) 11 min   Pt requested to transfer C>B. Required ModA +2 to slide pt to bed with railing and chair arm down. Required pulling of bed pad. Tolerated well, asx throughout. Pt lying comfortably in bed with all needs met, call bell in reach.  Kevin Bradford Mobility Specialist Please contact via Special educational needs teacher or  Rehab office at [phone]

## 2023-12-25 NOTE — TOC Transition Note (Signed)
 Transition of Care (TOC) - Discharge Note Sherin Dingwall RN, BSN Transitions of Care Unit 4E- RN Case Manager See Treatment Team for direct phone #   Patient Details  Name: Kevin Bradford MRN: [id number] Date of Birth: [birth year]  Transition of Care [facility]) CM/SW Contact:  Rox Cope, RN Phone Number: 12/25/2023, 1:50 PM   Clinical Narrative:    Notified by CIR liaison that pt has insurance approval and bed to admit to [facility] INPT rehab today.  Pt stable for transition to INPT rehab, No further TOC needs noted.  Pt will transition later today to CIR.    Final next level of care: IP Rehab Facility Barriers to Discharge: Barriers Resolved   Patient Goals and CMS Choice Patient states their goals for this hospitalization and ongoing recovery are:: rehab then return home CMS Medicare.gov Compare Post Acute Care list provided to:: Patient Choice offered to / list presented to : Patient, Spouse      Discharge Placement               [facility] INPT rehab.         Discharge Plan and Services Additional resources added to the After Visit Summary for     Discharge Planning Services: CM Consult Post Acute Care Choice: IP Rehab          DME Arranged: N/A DME Agency: NA                  Social Drivers of Health (SDOH) Interventions SDOH Screenings   Food Insecurity: No Food Insecurity (12/18/2023)  Housing: Low Risk  (12/18/2023)  Transportation Needs: Unmet Transportation Needs (12/18/2023)  Utilities: Not At Risk (12/18/2023)  Depression (PHQ2-9): Low Risk  (05/14/2019)  Financial Resource Strain: Low Risk  (05/06/2018)  Physical Activity: Sufficiently Active (05/06/2018)  Social Connections: Moderately Isolated (12/19/2023)  Stress: No Stress Concern Present (05/06/2018)  Tobacco Use: Low Risk  (12/20/2023)     Readmission Risk Interventions    12/25/2023    1:50 PM  Readmission Risk Prevention Plan  Transportation Screening Complete  HRI or Home Care Consult  Complete  Social Work Consult for Recovery Care Planning/Counseling Complete  Palliative Care Screening Not Applicable  Medication Review Oceanographer) Complete

## 2023-12-25 NOTE — Progress Notes (Signed)
 RN attempted to call 4[facility] Rehab for report. Will try again.

## 2023-12-25 NOTE — Progress Notes (Signed)
 Inpatient Rehabilitation Admission Medication Review by a Pharmacist  A complete drug regimen review was completed for this patient to identify any potential clinically significant medication issues.  High Risk Drug Classes Is patient taking? Indication by Medication  Antipsychotic No   Anticoagulant Yes Enoxaparin  - VTE ppx  Antibiotic No   Opioid Yes Tramadol  - PRN pain  Antiplatelet Yes Clopidogrel  - stroke ppx  Hypoglycemics/insulin  Yes Insulin  glargine, aspart - DM  Vasoactive Medication No   Chemotherapy No   Other Yes Docusate, Senokot-S - constipation Robitussin DM - PRN cough Pantoprazole  - GERD ppx Vit C, MVI, Zinc  - supplement Melatonin - sleep     Type of Medication Issue Identified Description of Issue Recommendation(s)  Drug Interaction(s) (clinically significant)     Duplicate Therapy     Allergy     No Medication Administration End Date     Incorrect Dose     Additional Drug Therapy Needed     Significant med changes from prior encounter (inform family/care partners about these prior to discharge). Insulin  detemir > glargine while inpatient Communicate relevant medication changes to patient/family members at discharge from CIR.   Other       Clinically significant medication issues were identified that warrant physician communication and completion of prescribed/recommended actions by midnight of the next day:  No   Pharmacist comments: n/a   Time spent performing this drug regimen review (minutes): 20   Thank you for allowing pharmacy to be a part of this patient's care.  Claudia Cuff, PharmD, BCPS Clinical Pharmacist

## 2023-12-25 NOTE — Progress Notes (Signed)
 Admitted to CIR today. Wife at bedside. Oriented unit and assigned room. Discussed unit protocols/alarms, safety plan, schedule, and education binder. Fyi-patient height on flowsheet with and with out prosthetic leg.   Randeen Busman, LPN

## 2023-12-25 NOTE — Progress Notes (Signed)
 Nutrition Follow-up  DOCUMENTATION CODES:   Non-severe (moderate) malnutrition in context of chronic illness  INTERVENTION:  Continue carb modified diet for adequate blood sugar control Discontinue Ensure Max Ensure Plus High Protein po BID, each supplement provides 350 kcal and 20 grams of protein. Magic cup TID with meals, each supplement provides 290 kcal and 9 grams of protein MVI with minerals daily  Vitamin C  and Zinc  to support wound healing  NUTRITION DIAGNOSIS:   Moderate Malnutrition related to chronic illness (uncontrolled DM) as evidenced by mild fat depletion, moderate muscle depletion. - diagnosis updated 6/9   GOAL:   Patient will meet greater than or equal to 90% of their needs - goal unmet, addressing via meals and nutrition supplements  MONITOR:   PO intake, Supplement acceptance, Labs, Weight trends, Skin, I & O's  REASON FOR ASSESSMENT:   Consult Wound healing  ASSESSMENT:   78 y/o male with h/o CKD II, uncontrolled IDDM, stroke, osteomyelitis s/p L BKA and GERD who is admitted with R diabetic foot infection with abscess and calcaneal osteomyelitis.  6/4: s/p R BKA  Awaiting CIR approval.   Pt notably drowsy at time of visit. Sitting up in bedside recliner. He endorses having some nausea this morning as he was recently moved from bed to recliner with PT. RN aware.  Pt having difficulty with recall as days/time is blending together.  He feels that his intake has improved slightly day by day.   Limited meal completions on file to review. Pt documented to consume 30% of breakfast this morning.  Spoke with RN who mentions that pt ate about half an omelette this morning. He is not consuming Ensure Max as he feels they are bland.  Discussed alternative nutrition supplements which pt was reluctant but amenable to try them.  Last documented BM was 6/1. RN attempted to provide bowel regimen but pt refused. RN provided education on constipation with pain  medication use and importance of having more frequent BM's.   No output from wound VAC canister.   Admit weight: 87.3 kg Current weight: 80 kg  Medications: Vitamin C  500mg  BID, colace daily, SSI 0-9 units QID, novolog  6 units TID, semglee  25 units daily, melatonin, MVI, zinc  220mg  daily   Labs:   CBG's 89-138 x24 hours HgbA1c 12.4% (06/04)  NUTRITION - FOCUSED PHYSICAL EXAM:  Flowsheet Row Most Recent Value  Orbital Region Moderate depletion  Upper Arm Region Severe depletion  Thoracic and Lumbar Region Mild depletion  Buccal Region Mild depletion  Temple Region Moderate depletion  Clavicle Bone Region Moderate depletion  Clavicle and Acromion Bone Region Severe depletion  Scapular Bone Region Moderate depletion  Dorsal Hand Moderate depletion  Patellar Region Unable to assess  Anterior Thigh Region Unable to assess  [B BKA]  Posterior Calf Region Unable to assess  Edema (RD Assessment) None  Hair Reviewed  Eyes Reviewed  Mouth Reviewed  Skin Reviewed  Nails Reviewed   Diet Order:   Diet Order             Diet Carb Modified Fluid consistency: Thin; Room service appropriate? No  Diet effective now                   EDUCATION NEEDS:   Not appropriate for education at this time  Skin:  Skin Assessment: Skin Integrity Issues: Skin Integrity Issues:: Wound VAC Wound Vac: R BKA  Last BM:  6/1  Height:   Ht Readings from Last 1 Encounters:  12/20/23  6\' 2"  (1.88 m)    Weight:   Wt Readings from Last 1 Encounters:  12/25/23 80 kg   BMI:  Body mass index is 24.1 kg/m. (Adjusted for bilateral BKA)  Estimated Nutritional Needs:   Kcal:  2100-2400kcal/day  Protein:  105-120g/day  Fluid:  2.1-2.4L/day  Rocklin Chute, RDN, LDN Clinical Nutrition See AMiON for contact information.

## 2023-12-25 NOTE — Progress Notes (Signed)
 PROGRESS NOTE    Kevin Bradford  ZOX:[id number] DOB: [birth year] DOA: 12/15/2023 PCP: Jearldine Mina, MD   Brief Narrative:  This 78 y.o. male with past medical history significant for poorly controlled diabetes mellitus, history of CVA, CKD stage II, and history of osteomyelitis status post left BKA presented to the hospital with fever and right heel wound.  Had temperature of 102 F at home.  Family also noted to have foul-smelling discharge from the wound.  In the ED, Patient was afebrile.  Had transient tachycardia and mild hypotension.  Creatinine was elevated at 1.4.  WBC elevated at 16, 900.  Lactic acid was 1.1.  X-ray of the right foot without any acute osteomyelitis.   Blood cultures were collected, MRI was obtained, Patient was given Unasyn  and linezolid  and was at the hospital for further evaluation and treatment.  Assessment & Plan:   Principal Problem:   Diabetic infection of right foot (HCC) Active Problems:   CKD (chronic kidney disease) stage 2, GFR 60-89 ml/min   Type 2 diabetes mellitus with peripheral neuropathy (HCC)   History of stroke   Acute osteomyelitis of right calcaneus (HCC)   Cutaneous abscess of right foot   Diabetic infection of right foot (HCC): S/P right BKA with ortho, appreciate their assistance. Possible CIR d/c, appreciate TOC team DC wound vac at time of DC Continue Tramadol , Dilaudid  as needed.              CKD (chronic kidney disease) stage 2, GFR 60-89 ml/min: Serum creatinine at baseline.           Monitor renal functions.   Type 2 diabetes mellitus with peripheral neuropathy (HCC) Continue Basal bolus insulin .              History of stroke: Continue Plavix  75 mg daily. No residual deficits.   Acute osteomyelitis of right calcaneus (HCC) Continue management as per ortho.   Acute cough: Suspect atelectasis- rec'd IS which he politely declined for now, states cough not that bothersome   Nutrition Problem: Increased nutrient  needs Etiology: wound healing   DVT prophylaxis: Lovenox  Code Status: Full code Family Communication: No family at bed side Disposition Plan:    Status is: Inpatient Remains inpatient appropriate because:  S/p Right BKA  Awaiting CIR  Consultants:  Orthopaedics  Procedures:Right BKA  Antimicrobials:  Anti-infectives (From admission, onward)    Start     Dose/Rate Route Frequency Ordered Stop   12/20/23 1000  ceFAZolin  (ANCEF ) IVPB 2g/100 mL premix        2 g 200 mL/hr over 30 Minutes Intravenous On call to O.R. 12/19/23 2322 12/20/23 1320   12/16/23 1200  linezolid  (ZYVOX ) tablet 600 mg       Placed in "And" Linked Group   600 mg Oral Every 12 hours 12/16/23 0347 12/21/23 0952   12/16/23 0600  Ampicillin -Sulbactam (UNASYN ) 3 g in sodium chloride  0.9 % 100 mL IVPB        3 g 200 mL/hr over 30 Minutes Intravenous Every 6 hours 12/16/23 0348 12/21/23 2359   12/16/23 0045  Ampicillin -Sulbactam (UNASYN ) 3 g in sodium chloride  0.9 % 100 mL IVPB       Placed in "And" Linked Group   3 g 200 mL/hr over 30 Minutes Intravenous  Once 12/16/23 0036 12/16/23 0127   12/16/23 0045  linezolid  (ZYVOX ) IVPB 600 mg       Placed in "And" Linked Group   600 mg 300 mL/hr over 60 Minutes  Intravenous  Once 12/16/23 0036 12/16/23 0319        Subjective: Patient was seen and examined at bedside. Overnight Events noted. Patient reports feeling better, He wants to be discharged to rehab.  Awaiting insurance authorization.  Objective: Vitals:   12/25/23 0336 12/25/23 0747 12/25/23 1003 12/25/23 1011  BP: 111/68 119/74 (!) 147/83 (!) 146/79  Pulse: 72 76    Resp: 16 16    Temp: 98.4 F (36.9 C) 98.2 F (36.8 C)    TempSrc: Oral Oral    SpO2: 95% 98%    Weight: 80 kg     Height:        Intake/Output Summary (Last 24 hours) at 12/25/2023 1102 Last data filed at 12/25/2023 0856 Gross per 24 hour  Intake 280 ml  Output 600 ml  Net -320 ml   Filed Weights   12/23/23 0435 12/24/23 0500  12/25/23 0336  Weight: 84.8 kg 83.1 kg 80 kg    Examination:  General exam: Appears calm and comfortable,   not in any acute distress. Respiratory system: CTA Bilaterally. Respiratory effort normal. Cardiovascular system: S1 & S2 heard, RRR. No JVD, murmurs, rubs, gallops or clicks.  Gastrointestinal system: Abdomen is non distended, soft and non tender.  Normal bowel sounds heard. Central nervous system: Alert and oriented x 3. No focal neurological deficits. Extremities:  Bilateral BKA Skin: No rashes, lesions or ulcers Psychiatry: Judgement and insight appear normal. Mood & affect appropriate.     Data Reviewed: I have personally reviewed following labs and imaging studies  CBC: Recent Labs  Lab 12/20/23 0309 12/21/23 0304 12/22/23 0322 12/23/23 0253 12/24/23 0324 12/25/23 0328  WBC 11.1* 9.5 9.5 6.1 4.6 5.2  NEUTROABS 9.2*  --   --   --   --   --   HGB 9.9* 10.1* 9.8* 9.9* 9.8* 9.6*  HCT 30.0* 30.5* 30.1* 30.4* 30.6* 30.5*  MCV 92.3 94.4 94.7 94.4 94.4 94.7  PLT 415* 388 418* 405* 367 351   Basic Metabolic Panel: Recent Labs  Lab 12/21/23 0304 12/22/23 0322 12/23/23 0253 12/24/23 0324 12/25/23 0328  NA 132* 134* 132* 136 135  K 4.3 4.0 4.0 4.2 4.4  CL 97* 99 98 100 101  CO2 22 28 28 27 25   GLUCOSE 331* 193* 109* 93 118*  BUN 19 16 14 10 9   CREATININE 1.27* 1.22 1.12 1.09 1.07  CALCIUM  8.4* 8.3* 7.6* 8.3* 8.4*  MG 2.0 2.0 2.0  --   --   PHOS  --  3.1  --   --   --    GFR: Estimated Creatinine Clearance: 64.4 mL/min (by C-G formula based on SCr of 1.07 mg/dL). Liver Function Tests: Recent Labs  Lab 12/20/23 0309  AST 28  ALT 24  ALKPHOS 112  BILITOT 1.1  PROT 5.7*  ALBUMIN 1.9*   No results for input(s): "LIPASE", "AMYLASE" in the last 168 hours. No results for input(s): "AMMONIA" in the last 168 hours. Coagulation Profile: No results for input(s): "INR", "PROTIME" in the last 168 hours. Cardiac Enzymes: No results for input(s): "CKTOTAL",  "CKMB", "CKMBINDEX", "TROPONINI" in the last 168 hours. BNP (last 3 results) No results for input(s): "PROBNP" in the last 8760 hours. HbA1C: No results for input(s): "HGBA1C" in the last 72 hours. CBG: Recent Labs  Lab 12/24/23 0606 12/24/23 1229 12/24/23 1653 12/24/23 2144 12/25/23 0558  GLUCAP 89 121* 138* 138* 105*   Lipid Profile: No results for input(s): "CHOL", "HDL", "LDLCALC", "TRIG", "CHOLHDL", "LDLDIRECT"  in the last 72 hours. Thyroid  Function Tests: No results for input(s): "TSH", "T4TOTAL", "FREET4", "T3FREE", "THYROIDAB" in the last 72 hours. Anemia Panel: No results for input(s): "VITAMINB12", "FOLATE", "FERRITIN", "TIBC", "IRON", "RETICCTPCT" in the last 72 hours. Sepsis Labs: No results for input(s): "PROCALCITON", "LATICACIDVEN" in the last 168 hours.  Recent Results (from the past 240 hours)  Culture, blood (Routine x 2)     Status: None   Collection Time: 12/15/23 11:43 PM   Specimen: BLOOD RIGHT HAND  Result Value Ref Range Status   Specimen Description BLOOD RIGHT HAND  Final   Special Requests   Final    BOTTLES DRAWN AEROBIC AND ANAEROBIC Blood Culture results may not be optimal due to an inadequate volume of blood received in culture bottles   Culture   Final    NO GROWTH 5 DAYS Performed at [facility], [address] [place], [place] [address]    Report Status 12/21/2023 FINAL  Final  Culture, blood (Routine x 2)     Status: None   Collection Time: 12/15/23 11:43 PM   Specimen: BLOOD RIGHT HAND  Result Value Ref Range Status   Specimen Description BLOOD RIGHT HAND  Final   Special Requests   Final    BOTTLES DRAWN AEROBIC ONLY Blood Culture adequate volume   Culture   Final    NO GROWTH 5 DAYS Performed at [facility], [address], [place], [place] [address]    Report Status 12/21/2023 FINAL  Final  Surgical PCR screen     Status: None   Collection Time: 12/19/23  7:50 PM   Specimen: Nasal Mucosa; Nasal Swab  Result  Value Ref Range Status   MRSA, PCR NEGATIVE NEGATIVE Final   Staphylococcus aureus NEGATIVE NEGATIVE Final    Comment: (NOTE) The Xpert SA Assay (FDA approved for NASAL specimens in patients 73 years of age and older), is one component of a comprehensive surveillance program. It is not intended to diagnose infection nor to guide or monitor treatment. Performed at [facility], [address], [place], [place] [address]    Radiology Studies: No results found.  Scheduled Meds:  ascorbic acid   500 mg Oral BID   clopidogrel   75 mg Oral Daily   docusate sodium   100 mg Oral Daily   enoxaparin  (LOVENOX ) injection  40 mg Subcutaneous Daily   feeding supplement  237 mL Oral BID BM   insulin  aspart  0-9 Units Subcutaneous TID AC & HS   insulin  aspart  6 Units Subcutaneous TID WC   insulin  glargine-yfgn  25 Units Subcutaneous QHS   melatonin  3 mg Oral QHS   multivitamin with minerals  1 tablet Oral Daily   pantoprazole   40 mg Oral Daily   sodium chloride  flush  3 mL Intravenous Q12H   traMADol   50 mg Oral Q6H   zinc  sulfate (50mg  elemental zinc )  220 mg Oral Daily   Continuous Infusions:  magnesium  sulfate bolus IVPB       LOS: 9 days    Time spent: 50 mins    Magdalene School, MD [facility]   If 7PM-7AM, please contact night-coverage

## 2023-12-25 NOTE — Progress Notes (Signed)
 Physical Therapy Treatment Patient Details Name: Kevin Bradford MRN: [id number] DOB: 1946/07/10 Today's Date: 12/25/2023   History of Present Illness Patient 78 y.o. male presented to [facility] with Rt foot infection w/ abscess and calcaneal osteomyelitis. Pt now s/p Rt BKA on 12/20/23. PMH significant for poorly controlled DM, CVA, CKD stage III, and hx of osteomyelitis status post Lt BKA and revision in 2019.    PT Comments  Pt a sleepy with noted delayed processing however suspect due to receiving IV dilaudid  20 min prior to PT session. Focused on edge of bed sitting balance, lateral scooting, and prepping to stand on L prosthetic leg. PTA pt was amb without AD and use of L prosthesis. Pt to greatly benefit from aggressive inpatient rehab program to achieve safe supervision level of function via use of w/c and/or RW to decrease burden of care on spouse. Pt with great home set up and support. Pt demonstrates excellent rehab potential. Acute PT to cont to follow.    If plan is discharge home, recommend the following: Two people to help with walking and/or transfers;A lot of help with bathing/dressing/bathroom;Assistance with cooking/housework;Assist for transportation;Help with stairs or ramp for entrance   Can travel by private vehicle        Equipment Recommendations  None recommended by PT (defer to next venue)    Recommendations for Other Services Rehab consult     Precautions / Restrictions Precautions Precautions: Fall Recall of Precautions/Restrictions: Intact Precaution/Restrictions Comments: denies falls ("I've never fallen"); wound vac to R LE Required Braces or Orthoses: Other Brace Other Brace: R LE limb protector, L LE prosthesis Restrictions Weight Bearing Restrictions Per Provider Order: Yes RLE Weight Bearing Per Provider Order: Non weight bearing Other Position/Activity Restrictions: New R BKA     Mobility  Bed Mobility Overal bed mobility: Needs Assistance Bed Mobility:  Supine to Sit     Supine to sit: Mod assist, HOB elevated, Used rails     General bed mobility comments: used bilat bed rails, difficulty with trunk elevation, assist to scoot forward, posterior bias, verbal and tactile cues to bring chest forward    Transfers Overall transfer level: Needs assistance Equipment used: None (with L LE prosthetic donned) Transfers: Bed to chair/wheelchair/BSC            Lateral/Scoot Transfers: From elevated surface, +2 safety/equipment, Mod assist, Max assist (with use of bed pad) General transfer comment: worked on anterior weight shift and elevating bottom off edge of bed, pt min/modA to lateral scoot along EOB, maxAx2 with use of bed pad to scoot over from EOB to chair to clear open space    Ambulation/Gait               General Gait Details: unable at this time   Stairs             Wheelchair Mobility     Tilt Bed    Modified Rankin (Stroke Patients Only)       Balance Overall balance assessment: Needs assistance Sitting-balance support: Single extremity supported, No upper extremity supported, Feet unsupported Sitting balance-Leahy Scale: Fair Sitting balance - Comments: Pt initially requiring min/modA due to posterior bias, with verbal cues to lean forward pt supervision, modA to maintain balance while donning L LE prosthesis                                    Communication Communication Communication: No  apparent difficulties  Cognition Arousal: Lethargic, Suspect due to medications Behavior During Therapy: Anxious, Flat affect   PT - Cognitive impairments: No apparent impairments                       PT - Cognition Comments: pt reports "I'm a little out of sorts." suspect from recent IV dilaudid . Pt with difficulty staying awake until transfered to EOB. Following commands: Impaired Following commands impaired: Follows one step commands with increased time    Cueing Cueing  Techniques: Verbal cues, Visual cues  Exercises Amputee Exercises Knee Extension: AROM, Left, 10 reps, Seated (with hold at end range)    General Comments General comments (skin integrity, edema, etc.): BP elevated from 119/74 to 146/79 once up in chair, RN aware      Pertinent Vitals/Pain Pain Assessment Pain Assessment: 0-10 Pain Score: 1  Faces Pain Scale: No hurt Pain Location: R LE Pain Descriptors / Indicators: Aching, Discomfort, Operative site guarding Pain Intervention(s): Monitored during session    Home Living                          Prior Function            PT Goals (current goals can now be found in the care plan section) Acute Rehab PT Goals PT Goal Formulation: With patient Time For Goal Achievement: 12/21/23 Potential to Achieve Goals: Good Progress towards PT goals: Progressing toward goals    Frequency    Min 3X/week      PT Plan      Co-evaluation              AM-PAC PT "6 Clicks" Mobility   Outcome Measure  Help needed turning from your back to your side while in a flat bed without using bedrails?: A Little Help needed moving from lying on your back to sitting on the side of a flat bed without using bedrails?: A Little Help needed moving to and from a bed to a chair (including a wheelchair)?: A Little Help needed standing up from a chair using your arms (e.g., wheelchair or bedside chair)?: Total Help needed to walk in hospital room?: Total Help needed climbing 3-5 steps with a railing? : Total 6 Click Score: 12    End of Session Equipment Utilized During Treatment: Gait belt Activity Tolerance: Patient tolerated treatment well Patient left: in chair;with call bell/phone within reach;with chair alarm set;with family/visitor present Nurse Communication: Mobility status PT Visit Diagnosis: Unsteadiness on feet (R26.81);Other abnormalities of gait and mobility (R26.89);Muscle weakness (generalized) (M62.81);Difficulty in  walking, not elsewhere classified (R26.2)     Time: 1610-9604 PT Time Calculation (min) (ACUTE ONLY): 33 min  Charges:    $Therapeutic Exercise: 8-22 mins $Therapeutic Activity: 8-22 mins PT General Charges $$ ACUTE PT VISIT: 1 Visit                     Renaee Caro, PT, DPT Acute Rehabilitation Services Secure chat preferred Office #: [phone]    Jenna Moan 12/25/2023, 11:59 AM

## 2023-12-25 NOTE — H&P (Signed)
 Physical Medicine and Rehabilitation Admission H&P    Chief Complaint  Patient presents with   rt foot swollen  : HPI: Kevin Bradford is a 78 year old right handed male with history significant for poorly controlled diabetes mellitus with peripheral neuropathy, history of CVA, CKD stage II, left BKA 12/27/2017 as well as requiring revision due to dehiscence of wound and received CIR 11/24/2017 - 11/30/2017.  Per chart review patient lives with spouse.  Two-level home bed and bath main level one-step to entry.  Independent/active with left prosthesis independent ADLs and driving.  Good family support.  Presented 12/15/2023 with right foot infection/abscess with calcaneal osteomyelitis followed by orthopedic services and no change with conservative care requiring right BKA 12/20/2023 per Dr. Julio Ohm.  Wound VAC as directed.  Lovenox  for DVT prophylaxis.  Patient remained on chronic Plavix  for history of CVA.  Acute blood loss anemia 9.6 and monitored.  Renal function remained stable with latest creatinine 1.07.  Has not had BM in few days.  Reports pain is controlled.  Therapy evaluations completed due to patient's decreased functional mobility was admitted for a comprehensive rehab program.  Review of Systems  Constitutional:  Negative for chills and fever.  HENT:  Negative for congestion and hearing loss.   Eyes:  Positive for blurred vision. Negative for double vision.  Respiratory:  Negative for cough, shortness of breath and wheezing.   Cardiovascular:  Positive for leg swelling. Negative for chest pain and palpitations.  Gastrointestinal:  Positive for constipation. Negative for heartburn, nausea and vomiting.  Genitourinary:  Negative for dysuria, flank pain and hematuria.  Musculoskeletal:  Positive for joint pain and myalgias.  Skin:  Negative for rash.  Neurological:  Positive for sensory change and focal weakness.  Psychiatric/Behavioral:  The patient has insomnia.   All other systems  reviewed and are negative.  Past Medical History:  Diagnosis Date   Arthritis    "joint pain" (08/16/2017)   Below knee amputation (HCC)    Cellulitis and abscess of leg 10/18/2014   CKD (chronic kidney disease) stage 3, GFR 30-59 ml/min (HCC)    creatinine increases with antibiotics per pt, no known kidney disease per patient   Dehiscence of closure of skin, subsequent encounter    Diabetic foot ulcer (HCC)    left   Diabetic peripheral neuropathy (HCC)    GERD (gastroesophageal reflux disease)    12/26/2017- "not this year."    Osteomyelitis (HCC)    left transmetatarsal    Stroke (HCC)    Type II diabetes mellitus (HCC)    Past Surgical History:  Procedure Laterality Date   AMPUTATION Left 07/19/2017   Procedure: LEFT TRANSMETATARSAL AMPUTATION;  Surgeon: Timothy Ford, MD;  Location: [facility] OR;  Service: Orthopedics;  Laterality: Left;   AMPUTATION Left 08/16/2017   REVISION LEFT TRANSMETATARSAL AMPUTATION   AMPUTATION Left 11/22/2017   Procedure: LEFT BELOW KNEE AMPUTATION;  Surgeon: Timothy Ford, MD;  Location: [facility] OR;  Service: Orthopedics;  Laterality: Left;   AMPUTATION Right 12/20/2023   Procedure: AMPUTATION RIGHT BELOW KNEE;  Surgeon: Timothy Ford, MD;  Location: [facility] OR;  Service: Orthopedics;  Laterality: Right;   CATARACT EXTRACTION W/ INTRAOCULAR LENS  IMPLANT, BILATERAL  ~ 2016   EYE SURGERY Bilateral    catarct   I & D EXTREMITY Left 10/17/2014   Procedure: IRRIGATION AND DEBRIDEMENT EXTREMITY LEFT FOOT;  Surgeon: Timothy Ford, MD;  Location: [facility] OR;  Service: Orthopedics;  Laterality: Left;   IR  ANGIO INTRA EXTRACRAN SEL COM CAROTID INNOMINATE BILAT MOD SED  05/07/2018   IR ANGIO VERTEBRAL SEL SUBCLAVIAN INNOMINATE BILAT MOD SED  05/07/2018   STUMP REVISION Left 08/16/2017   Procedure: REVISION LEFT TRANSMETATARSAL AMPUTATION;  Surgeon: Timothy Ford, MD;  Location: [facility] OR;  Service: Orthopedics;  Laterality: Left;   STUMP REVISION Left 12/27/2017   Procedure: LEFT BELOW  KNEE AMPUTATION REVISION;  Surgeon: Timothy Ford, MD;  Location: [facility] OR;  Service: Orthopedics;  Laterality: Left;   TOE AMPUTATION Right 2013   5th toe   Family History  Problem Relation Age of Onset   Dementia Mother    Social History:  reports that he has never smoked. He has never used smokeless tobacco. He reports current alcohol use of about 1.0 standard drink of alcohol per week. He reports that he does not use drugs. Allergies:  Allergies  Allergen Reactions   Roxicodone  [Oxycodone ] Anaphylaxis, Shortness Of Breath and Other (See Comments)    Throat closed   Medications Prior to Admission  Medication Sig Dispense Refill   acetaminophen  (TYLENOL ) 500 MG tablet Take 500-1,000 mg by mouth every 6 (six) hours as needed for moderate pain (pain score 4-6), headache or fever.     Ascorbic Acid  (VITAMIN C  PO) Take 1 tablet by mouth daily.     Cholecalciferol (VITAMIN D3 PO) Take 1 capsule by mouth daily.     clopidogrel  (PLAVIX ) 75 MG tablet Take 1 tablet (75 mg total) by mouth daily. 30 tablet 6   diclofenac Sodium (VOLTAREN) 1 % GEL Apply 1 Application topically 4 (four) times daily as needed (pain).     insulin  aspart (NOVOLOG  FLEXPEN) 100 UNIT/ML FlexPen Inject 4 Units into the skin 3 (three) times daily with meals. 15 mL 11   insulin  detemir (LEVEMIR  FLEXTOUCH) 100 UNIT/ML FlexPen Inject 25 Units into the skin daily at 10 pm. 15 mL 2   MAGNESIUM  PO Take 1 tablet by mouth daily.     metFORMIN  (GLUCOPHAGE ) 500 MG tablet Take 1,000 mg by mouth 2 (two) times daily with a meal.     VITAMIN A PO Take 1 capsule by mouth daily.        Home: Home Living Family/patient expects to be discharged to:: Private residence Living Arrangements: Spouse/significant other Available Help at Discharge: Family, Available 24 hours/day Type of Home: House Home Access: Stairs to enter [employer] of Steps: 1 Entrance Stairs-Rails: Left, Right Home Layout: Two level, Able to live on main  level with bedroom/bathroom, Bed/bath upstairs Alternate Level Stairs-Number of Steps: 16 Alternate Level Stairs-Rails: Left Bathroom Shower/Tub: Engineer, manufacturing systems: Handicapped height Bathroom Accessibility: Yes Home Equipment: Grab bars - tub/shower, Tub bench, Wheelchair - manual, Agricultural consultant (2 wheels) Additional Comments: family members live nearby and able to offer assistance  Lives With: Spouse   Functional History: Prior Function Prior Level of Function : Independent/Modified Independent, Driving ADLs Comments: Pt Independent with ADLs and IADLs and drives  Functional Status:  Mobility: Bed Mobility Overal bed mobility: Needs Assistance Bed Mobility: Supine to Sit, Sit to Supine, Rolling Rolling: Min assist Sidelying to sit: Mod assist, Used rails, HOB elevated Supine to sit: Min assist, HOB elevated, Used rails Sit to supine: Min assist, HOB elevated, Used rails Transfers Overall transfer level: Needs assistance Equipment used: None (with L LE prosthetic donned) Transfers: Bed to chair/wheelchair/BSC Sit to Stand: Max assist, +2 physical assistance, +2 safety/equipment Bed to/from chair/wheelchair/BSC transfer type:: Lateral/scoot transfer  Lateral/Scoot Transfers: From elevated  surface, +2 safety/equipment, Min assist General transfer comment: min-mod +2 assist for lateral scoot bed<>BSC. no slide board available but would improve pt's ability to transfer.      ADL: ADL Overall ADL's : Needs assistance/impaired Eating/Feeding: Independent, Sitting Grooming: Set up, Sitting Upper Body Bathing: Set up, Sitting Lower Body Bathing: Moderate assistance, Sitting/lateral leans, Cueing for compensatory techniques Upper Body Dressing : Contact guard assist, Sitting Lower Body Dressing: Supervision/safety, Moderate assistance, Sitting/lateral leans Lower Body Dressing Details (indicate cue type and reason): Pt able to donn L LE prosthetic with Supervision.  Pt requiring Mod assist to bring clothing up over hips (simulated due to wound vac) and managing lines in sitting lateral leans Toilet Transfer: Moderate assistance, +2 for safety/equipment, Cueing for safety (lateral scoot; drop-arm BSC) Toilet Transfer Details (indicate cue type and reason): simulated to drop-arm recliner Toileting- Clothing Manipulation and Hygiene: Maximal assistance, Bed level General ADL Comments: Pt requiring cues for compensatory strategies and increased safety  Cognition: Cognition Orientation Level: Oriented X4 Cognition Arousal: Alert Behavior During Therapy: WFL for tasks assessed/performed, Anxious (Pt with mild signs of anxiety when attempting STS from EOB)  Physical Exam: Blood pressure 111/68, pulse 72, temperature 98.4 F (36.9 C), temperature source Oral, resp. rate 16, height 6\' 2"  (1.88 m), weight 80 kg, SpO2 95%.   General: No apparent distress HEENT: Head is normocephalic, atraumatic, sclera anicteric, oral mucosa pink and moist, dentition intact Neck: Supple without JVD or lymphadenopathy Heart: Reg rate and rhythm. No murmurs rubs or gallops Chest: CTA bilaterally without wheezes, rales, or rhonchi; no distress Abdomen: Soft, non-tender, non-distended, bowel sounds positive-hypoactive Extremities: B/L BKA, R BKA with limb protector and wound VAC- no drainage in canister Psych: Pt's affect is flat. Pt is cooperative Skin: Clean and intact without signs of breakdown Neuro:   Mental Status: AAOx3, memory intact Speech/Languate:  fluent, follows simple commands CRANIAL NERVES: 2-12 grossly intact   MOTOR: RUE: 5/5 Deltoid, 5/5 Biceps, 5/5 Triceps,5/5 Grip LUE: 5/5 Deltoid, 5/5 Biceps, 5/5 Triceps, 5/5 Grip RLE:  Declines to try to lift today due to recent surgery LLE: HF 5/5, KE 5/5  SENSORY: Normal to touch all 4 extremities     Results for orders placed or performed during the hospital encounter of 12/15/23 (from the past 48 hours)   Glucose, capillary     Status: Abnormal   Collection Time: 12/23/23  6:16 AM  Result Value Ref Range   Glucose-Capillary 111 (H) 70 - 99 mg/dL    Comment: Glucose reference range applies only to samples taken after fasting for at least 8 hours.   Comment 1 Notify RN    Comment 2 Document in Chart   Glucose, capillary     Status: Abnormal   Collection Time: 12/23/23 12:22 PM  Result Value Ref Range   Glucose-Capillary 125 (H) 70 - 99 mg/dL    Comment: Glucose reference range applies only to samples taken after fasting for at least 8 hours.  Glucose, capillary     Status: None   Collection Time: 12/23/23  6:22 PM  Result Value Ref Range   Glucose-Capillary 89 70 - 99 mg/dL    Comment: Glucose reference range applies only to samples taken after fasting for at least 8 hours.  Glucose, capillary     Status: Abnormal   Collection Time: 12/23/23  9:27 PM  Result Value Ref Range   Glucose-Capillary 130 (H) 70 - 99 mg/dL    Comment: Glucose reference range applies only to samples taken  after fasting for at least 8 hours.  Basic metabolic panel with GFR     Status: Abnormal   Collection Time: 12/24/23  3:24 AM  Result Value Ref Range   Sodium 136 135 - 145 mmol/L   Potassium 4.2 3.5 - 5.1 mmol/L   Chloride 100 98 - 111 mmol/L   CO2 27 22 - 32 mmol/L   Glucose, Bld 93 70 - 99 mg/dL    Comment: Glucose reference range applies only to samples taken after fasting for at least 8 hours.   BUN 10 8 - 23 mg/dL   Creatinine, Ser 9.32 0.61 - 1.24 mg/dL   Calcium  8.3 (L) 8.9 - 10.3 mg/dL   GFR, Estimated >35 >57 mL/min    Comment: (NOTE) Calculated using the CKD-EPI Creatinine Equation (2021)    Anion gap 9 5 - 15    Comment: Performed at [facility], [address] [place], [place] [address]  CBC     Status: Abnormal   Collection Time: 12/24/23  3:24 AM  Result Value Ref Range   WBC 4.6 4.0 - 10.5 K/uL   RBC 3.24 (L) 4.22 - 5.81 MIL/uL   Hemoglobin 9.8 (L) 13.0 - 17.0 g/dL   HCT  54.2 (L) 70.6 - 52.0 %   MCV 94.4 80.0 - 100.0 fL   MCH 30.2 26.0 - 34.0 pg   MCHC 32.0 30.0 - 36.0 g/dL   RDW 23.7 62.8 - 31.5 %   Platelets 367 150 - 400 K/uL   nRBC 0.0 0.0 - 0.2 %    Comment: Performed at [facility], [address] [place], [place] [address]  Glucose, capillary     Status: None   Collection Time: 12/24/23  6:06 AM  Result Value Ref Range   Glucose-Capillary 89 70 - 99 mg/dL    Comment: Glucose reference range applies only to samples taken after fasting for at least 8 hours.  Glucose, capillary     Status: Abnormal   Collection Time: 12/24/23 12:29 PM  Result Value Ref Range   Glucose-Capillary 121 (H) 70 - 99 mg/dL    Comment: Glucose reference range applies only to samples taken after fasting for at least 8 hours.  Glucose, capillary     Status: Abnormal   Collection Time: 12/24/23  4:53 PM  Result Value Ref Range   Glucose-Capillary 138 (H) 70 - 99 mg/dL    Comment: Glucose reference range applies only to samples taken after fasting for at least 8 hours.  Glucose, capillary     Status: Abnormal   Collection Time: 12/24/23  9:44 PM  Result Value Ref Range   Glucose-Capillary 138 (H) 70 - 99 mg/dL    Comment: Glucose reference range applies only to samples taken after fasting for at least 8 hours.  Basic metabolic panel with GFR     Status: Abnormal   Collection Time: 12/25/23  3:28 AM  Result Value Ref Range   Sodium 135 135 - 145 mmol/L   Potassium 4.4 3.5 - 5.1 mmol/L   Chloride 101 98 - 111 mmol/L   CO2 25 22 - 32 mmol/L   Glucose, Bld 118 (H) 70 - 99 mg/dL    Comment: Glucose reference range applies only to samples taken after fasting for at least 8 hours.   BUN 9 8 - 23 mg/dL   Creatinine, Ser 0.73 0.61 - 1.24 mg/dL   Calcium  8.4 (L) 8.9 - 10.3 mg/dL   GFR, Estimated >71 >  60 mL/min    Comment: (NOTE) Calculated using the CKD-EPI Creatinine Equation (2021)    Anion gap 9 5 - 15    Comment: Performed at [facility], [address], [place], [place] [address]  CBC     Status: Abnormal   Collection Time: 12/25/23  3:28 AM  Result Value Ref Range   WBC 5.2 4.0 - 10.5 K/uL   RBC 3.22 (L) 4.22 - 5.81 MIL/uL   Hemoglobin 9.6 (L) 13.0 - 17.0 g/dL   HCT 60.4 (L) 54.0 - 98.1 %   MCV 94.7 80.0 - 100.0 fL   MCH 29.8 26.0 - 34.0 pg   MCHC 31.5 30.0 - 36.0 g/dL   RDW 19.1 47.8 - 29.5 %   Platelets 351 150 - 400 K/uL   nRBC 0.0 0.0 - 0.2 %    Comment: Performed at [facility], [address] [place], [place] [address]   No results found.    Blood pressure 111/68, pulse 72, temperature 98.4 F (36.9 C), temperature source Oral, resp. rate 16, height 6\' 2"  (1.88 m), weight 80 kg, SpO2 95%.  Medical Problem List and Plan: 1. 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  Wound VAC as directed  -patient may not shower due to wound vac  -ELOS/Goals: 7 days, Mod I to Sup with PT/OT  -Admit to CIR 2.  Antithrombotics: -DVT/anticoagulation:  Pharmaceutical: Lovenox   -antiplatelet therapy: Plavix  75 mg daily 3. Pain Management: Tramadol  50 mg every 6 hours 4. Mood/Behavior/Sleep: Melatonin 3 mg nightly.  Provide emotional support  -antipsychotic agents: N/A 5. Neuropsych/cognition: This patient is capable of making decisions on his own behalf. 6. Skin/Wound Care: Routine skin checks  -Wound vac in place, Dr. Julio Ohm recommend discontinue VAC at time of discharge from hospital 7. Fluids/Electrolytes/Nutrition: Routine in and outs with follow-up chemistries 8.  Acute blood loss anemia.  Follow-up CBC. HGB 9.6 on 6/9 9.  History of left BKA 2019.  Patient did receive CIR.  Patient does have a prosthesis.  Independent prior to admission with prosthesis 10.  Diabetes mellitus with peripheral neuropathy.  Hemoglobin A1c 12.4.  Currently on NovoLog  6 units 3 times daily, Semglee  25 units nightly.  Check blood sugars AC and at bedtime.... Prior to admission patient on Levemir  25  units daily as well as NovoLog  4 units 3 times daily with meals, Glucophage  1000 mg twice daily CBG (last 3)  Recent Labs    12/25/23 0558 12/25/23 1250 12/25/23 1622  GLUCAP 105* 140* 140*     11.  CKD stage II.  Follow-up chemistries. Cr 1.07 on 6/9 12.  History of CVA.  Continue Plavix  13. Constipation  -Pt declines additional laxative today, says he will consider if no BM tomorrow    Sterling Eisenmenger, PA-C 12/25/2023  I have personally performed a face to face diagnostic evaluation of this patient and formulated the key components of the plan.  Additionally, I have personally reviewed laboratory data, imaging studies, as well as relevant notes and concur with the physician assistant's documentation above.  The patient's status has not changed from the original H&P.  Any changes in documentation from the acute care chart have been noted above.  Lylia Sand, MD

## 2023-12-25 NOTE — Progress Notes (Addendum)
 Called back for report; 4E nurse on lunch at this time.  Randeen Busman, LPN

## 2023-12-25 NOTE — Progress Notes (Signed)
 Physical Medicine and Rehabilitation Consult Reason for Consult: Evaluate appropriateness for Inpatient Rehab Referring Physician: Dr. Efrain Grant       HPI: Kevin Bradford is a 78 y.o. male with PMHx of  has a past medical history of Arthritis, Below knee amputation (HCC), Cellulitis and abscess of leg (10/18/2014), CKD (chronic kidney disease) stage 3, GFR 30-59 ml/min (HCC), Dehiscence of closure of skin, subsequent encounter, Diabetic foot ulcer (HCC), Diabetic peripheral neuropathy (HCC), GERD (gastroesophageal reflux disease), Osteomyelitis (HCC), Stroke (HCC), and Type II diabetes mellitus (HCC). . They were admitted to [facility] on 12/15/2023 for foul-smelling discharge from a wound on his right heel, found on ED eval to have acute osteomyelitis.  He was started on Unasyn  and linezolid , and was admitted for further evaluation.[name]  He went underwent right BKA with Dr. Julio Ohm on 6/4.  Hospitalization has been otherwise complicated by uncontrolled diabetes A1c 13.7, AKI on CKD stage II, antibiotics for osteomyelitis which are finished after 4 negative blood cultures, postop wound care, poor pain control managed with IV Dilaudid , constipation, and history of CVA on Plavix  pending restart.  PM&R was consulted to evaluate appropriateness for IPR admission.      On chart review, patient lives in a two-level home with first-floor set up with one-step to enter.  Family is available 24-7 with his significant other.  He additionally has other family members that live nearby and can offer assistance.  Prior to admission, he was independent with ADLs, IADLs, and mobility with a prosthetic on his left BKA without assistive device; noted to be very active with home and yard maintenance at baseline, and patient reports he was hauling toilets up and down steps the week prior to admission.  Currently, he is max assist +2 for sit to stand, mod assist for lateral scoot transfers, no attempted steps as patient is having  difficulty recognizing right lower extremity no longer sound for support.   Review of Systems  Constitutional:  Negative for chills and fever.  Respiratory:  Negative for cough and shortness of breath.   Cardiovascular:  Negative for chest pain and palpitations.  Gastrointestinal:  Positive for constipation. Negative for abdominal pain, diarrhea and vomiting.  Genitourinary:  Negative for dysuria and urgency.  Musculoskeletal:  Negative for back pain and joint pain.  Neurological:  Positive for sensory change and focal weakness. Negative for dizziness and headaches.  Psychiatric/Behavioral:  The patient is nervous/anxious and has insomnia.         Past Medical History:  Diagnosis Date   Arthritis      "joint pain" (08/16/2017)   Below knee amputation (HCC)     Cellulitis and abscess of leg 10/18/2014   CKD (chronic kidney disease) stage 3, GFR 30-59 ml/min (HCC)      creatinine increases with antibiotics per pt, no known kidney disease per patient   Dehiscence of closure of skin, subsequent encounter     Diabetic foot ulcer (HCC)      left   Diabetic peripheral neuropathy (HCC)     GERD (gastroesophageal reflux disease)      12/26/2017- "not this year."    Osteomyelitis (HCC)      left transmetatarsal    Stroke (HCC)     Type II diabetes mellitus (HCC)               Past Surgical History:  Procedure Laterality Date   AMPUTATION Left 07/19/2017    Procedure: LEFT TRANSMETATARSAL AMPUTATION;  Surgeon: Timothy Ford, MD;  Location: [facility] OR;  Service: Orthopedics;  Laterality: Left;   AMPUTATION Left 08/16/2017    REVISION LEFT TRANSMETATARSAL AMPUTATION   AMPUTATION Left 11/22/2017    Procedure: LEFT BELOW KNEE AMPUTATION;  Surgeon: Timothy Ford, MD;  Location: [facility] OR;  Service: Orthopedics;  Laterality: Left;   AMPUTATION Right 12/20/2023    Procedure: AMPUTATION RIGHT BELOW KNEE;  Surgeon: Timothy Ford, MD;  Location: [facility] OR;  Service: Orthopedics;  Laterality: Right;   CATARACT  EXTRACTION W/ INTRAOCULAR LENS  IMPLANT, BILATERAL   ~ 2016   EYE SURGERY Bilateral      catarct   I & D EXTREMITY Left 10/17/2014    Procedure: IRRIGATION AND DEBRIDEMENT EXTREMITY LEFT FOOT;  Surgeon: Timothy Ford, MD;  Location: [facility] OR;  Service: Orthopedics;  Laterality: Left;   IR ANGIO INTRA EXTRACRAN SEL COM CAROTID INNOMINATE BILAT MOD SED   05/07/2018   IR ANGIO VERTEBRAL SEL SUBCLAVIAN INNOMINATE BILAT MOD SED   05/07/2018   STUMP REVISION Left 08/16/2017    Procedure: REVISION LEFT TRANSMETATARSAL AMPUTATION;  Surgeon: Timothy Ford, MD;  Location: [facility] OR;  Service: Orthopedics;  Laterality: Left;   STUMP REVISION Left 12/27/2017    Procedure: LEFT BELOW KNEE AMPUTATION REVISION;  Surgeon: Timothy Ford, MD;  Location: [facility] OR;  Service: Orthopedics;  Laterality: Left;   TOE AMPUTATION Right 2013    5th toe             Family History  Problem Relation Age of Onset   Dementia Mother          Social History:  reports that he has never smoked. He has never used smokeless tobacco. He reports current alcohol use of about 1.0 standard drink of alcohol per week. He reports that he does not use drugs. Allergies:  Allergies       Allergies  Allergen Reactions   Roxicodone  [Oxycodone ] Anaphylaxis, Shortness Of Breath and Other (See Comments)      Throat closed            Medications Prior to Admission  Medication Sig Dispense Refill   acetaminophen  (TYLENOL ) 500 MG tablet Take 500-1,000 mg by mouth every 6 (six) hours as needed for moderate pain (pain score 4-6), headache or fever.       Ascorbic Acid  (VITAMIN C  PO) Take 1 tablet by mouth daily.       Cholecalciferol (VITAMIN D3 PO) Take 1 capsule by mouth daily.       clopidogrel  (PLAVIX ) 75 MG tablet Take 1 tablet (75 mg total) by mouth daily. 30 tablet 6   diclofenac Sodium (VOLTAREN) 1 % GEL Apply 1 Application topically 4 (four) times daily as needed (pain).       insulin  aspart (NOVOLOG  FLEXPEN) 100 UNIT/ML FlexPen Inject 4  Units into the skin 3 (three) times daily with meals. 15 mL 11   insulin  detemir (LEVEMIR  FLEXTOUCH) 100 UNIT/ML FlexPen Inject 25 Units into the skin daily at 10 pm. 15 mL 2   MAGNESIUM  PO Take 1 tablet by mouth daily.       metFORMIN  (GLUCOPHAGE ) 500 MG tablet Take 1,000 mg by mouth 2 (two) times daily with a meal.       VITAMIN A PO Take 1 capsule by mouth daily.              Home: Home Living Family/patient expects to be discharged to:: Private residence Living Arrangements: Spouse/significant other Available Help at Discharge: Family, Available 24 hours/day  Type of Home: House Home Access: Stairs to enter [employer] of Steps: 1 Entrance Stairs-Rails: Left, Right Home Layout: Two level, Able to live on main level with bedroom/bathroom, Bed/bath upstairs (1/2 bath downstairs) Alternate Level Stairs-Number of Steps: 16 Alternate Level Stairs-Rails: Left Bathroom Shower/Tub: Engineer, manufacturing systems: Handicapped height Bathroom Accessibility: Yes Home Equipment: Grab bars - tub/shower, Tub bench, Wheelchair - manual, Agricultural consultant (2 wheels) Additional Comments: family members live nearby and able to offer assistance  Functional History: Prior Function Prior Level of Function : Independent/Modified Independent, Driving ADLs Comments: Pt Independent with ADLs and IADLs and drives Functional Status:  Mobility: Bed Mobility Overal bed mobility: Needs Assistance Bed Mobility: Sidelying to Sit Sidelying to sit: Mod assist, Used rails, HOB elevated Transfers Overall transfer level: Needs assistance Equipment used: Rolling walker (2 wheels), None (with L LE prosthetic donned) Transfers: Sit to/from Stand, Bed to chair/wheelchair/BSC Sit to Stand: Max assist, +2 physical assistance, +2 safety/equipment Bed to/from chair/wheelchair/BSC transfer type:: Lateral/scoot transfer  Lateral/Scoot Transfers: From elevated surface, +2 safety/equipment, Min assist General  transfer comment: pt uanble to fully stand. difficulty shifting onto Lt LE (prosthetic and using UE's on RW for support. pt attempted and performed partial stands with bed rail for support from recliner (2x). min assist for lateral scoot bed>chair.   ADL: ADL Overall ADL's : Needs assistance/impaired Eating/Feeding: Independent, Sitting Grooming: Set up, Sitting Upper Body Bathing: Set up, Sitting Lower Body Bathing: Moderate assistance, Sitting/lateral leans, Cueing for compensatory techniques Upper Body Dressing : Contact guard assist, Sitting Lower Body Dressing: Supervision/safety, Moderate assistance, Sitting/lateral leans Lower Body Dressing Details (indicate cue type and reason): Pt able to donn L LE prosthetic with Supervision. Pt requiring Mod assist to bring clothing up over hips (simulated due to wound vac) and managing lines in sitting lateral leans Toilet Transfer: Moderate assistance, +2 for safety/equipment, Cueing for safety (lateral scoot; drop-arm BSC) Toilet Transfer Details (indicate cue type and reason): simulated to drop-arm recliner Toileting- Clothing Manipulation and Hygiene: Maximal assistance, Bed level General ADL Comments: Pt requiring cues for compensatory strategies and increased safety   Cognition: Cognition Orientation Level: Oriented X4 Cognition Arousal: Alert Behavior During Therapy: WFL for tasks assessed/performed, Anxious (Pt with mild signs of anxiety when attempting STS from EOB)   Blood pressure 106/65, pulse 76, temperature 97.7 F (36.5 C), temperature source Oral, resp. rate 20, height 6\' 2"  (1.88 m), weight 81.4 kg, SpO2 93%. Physical Exam   PE: Constitution: Appropriate appearance for age. No apparent distress   Resp: No respiratory distress. No accessory muscle usage. on RA and CTAB Cardio: Well perfused appearance. No peripheral edema. Abdomen: Nondistended. Nontender.   Psych: Appropriate mood and affect. Neuro: AAOx4. No apparent  cognitive deficits    Neurologic Exam:   DTRs: Reflexes were 2+ in bilateral   patella, biceps, BR and triceps.  Hoffmans: negative b/l Sensory exam: revealed normal sensation in all dermatomal regions in bilateral upper extremities and bilateral lower extremities Motor exam: strength 5/5 throughout bilateral upper extremities and bilateral lower extremities Coordination: Fine motor coordination was normal.       Lab Results Last 24 Hours       Results for orders placed or performed during the hospital encounter of 12/15/23 (from the past 24 hours)  Glucose, capillary     Status: Abnormal    Collection Time: 12/21/23 12:36 PM  Result Value Ref Range    Glucose-Capillary 346 (H) 70 - 99 mg/dL  Glucose, capillary  Status: Abnormal    Collection Time: 12/21/23  5:06 PM  Result Value Ref Range    Glucose-Capillary 296 (H) 70 - 99 mg/dL  Glucose, capillary     Status: Abnormal    Collection Time: 12/21/23  9:04 PM  Result Value Ref Range    Glucose-Capillary 218 (H) 70 - 99 mg/dL    Comment 1 Notify RN      Comment 2 Document in Chart    Basic metabolic panel     Status: Abnormal    Collection Time: 12/22/23  3:22 AM  Result Value Ref Range    Sodium 134 (L) 135 - 145 mmol/L    Potassium 4.0 3.5 - 5.1 mmol/L    Chloride 99 98 - 111 mmol/L    CO2 28 22 - 32 mmol/L    Glucose, Bld 193 (H) 70 - 99 mg/dL    BUN 16 8 - 23 mg/dL    Creatinine, Ser 6.04 0.61 - 1.24 mg/dL    Calcium  8.3 (L) 8.9 - 10.3 mg/dL    GFR, Estimated >54 >09 mL/min    Anion gap 7 5 - 15  CBC     Status: Abnormal    Collection Time: 12/22/23  3:22 AM  Result Value Ref Range    WBC 9.5 4.0 - 10.5 K/uL    RBC 3.18 (L) 4.22 - 5.81 MIL/uL    Hemoglobin 9.8 (L) 13.0 - 17.0 g/dL    HCT 81.1 (L) 91.4 - 52.0 %    MCV 94.7 80.0 - 100.0 fL    MCH 30.8 26.0 - 34.0 pg    MCHC 32.6 30.0 - 36.0 g/dL    RDW 78.2 95.6 - 21.3 %    Platelets 418 (H) 150 - 400 K/uL    nRBC 0.0 0.0 - 0.2 %  Magnesium      Status: None     Collection Time: 12/22/23  3:22 AM  Result Value Ref Range    Magnesium  2.0 1.7 - 2.4 mg/dL  Phosphorus     Status: None    Collection Time: 12/22/23  3:22 AM  Result Value Ref Range    Phosphorus 3.1 2.5 - 4.6 mg/dL  Glucose, capillary     Status: Abnormal    Collection Time: 12/22/23  6:20 AM  Result Value Ref Range    Glucose-Capillary 163 (H) 70 - 99 mg/dL      Imaging Results (Last 48 hours)  No results found.     Assessment/Plan: Diagnosis: Right BKA due to osteomyelitis, complicated by prior left BKA Does the need for close, 24 hr/day medical supervision in concert with the patient's rehab needs make it unreasonable for this patient to be served in a less intensive setting? Yes Co-Morbidities requiring supervision/potential complications:  uncontrolled diabetes , AKI on CKD stage II,  postop wound care, poor pain control managed with IV Dilaudid , constipation, and history of CVA on Plavix  pending restart.  Due to bowel management, safety, skin/wound care, disease management, medication administration, pain management, and patient education, does the patient require 24 hr/day rehab nursing? Yes Does the patient require coordinated care of a physician, rehab nurse, therapy disciplines of PT, OT to address physical and functional deficits in the context of the above medical diagnosis(es)? Yes Addressing deficits in the following areas: balance, endurance, locomotion, strength, transferring, bathing, dressing, feeding, grooming, and toileting Can the patient actively participate in an intensive therapy program of at least 3 hrs of therapy per day at least 5 days per  week? Yes The potential for patient to make measurable gains while on inpatient rehab is good Anticipated functional outcomes upon discharge from inpatient rehab are modified independent  with PT, modified independent with OT,   Estimated rehab length of stay to reach the above functional goals is: 5-7 days Anticipated  discharge destination: Home Overall Rehab/Functional Prognosis: good   POST ACUTE RECOMMENDATIONS: This patient's condition is appropriate for continued rehabilitative care in the following setting: CIR Patient has agreed to participate in recommended program. Yes Note that insurance prior authorization may be required for reimbursement for recommended care.   Comment: Mr. Kevin Bradford is a 78 year old male with prior left BKA who was independent with a prosthetic and fairly active, who now presents with a new right BKA due to osteomyelitis.  His hospitalization has been complicated by postop wound management, poor pain control, uncontrolled diabetes, and constipation.  He is extremely motivated and could benefit from skilled PT/OT intervention in an inpatient setting to practice bearing weight solely on his left prosthetic to allow independent transfers with a RW and ADLs.     I have personally performed a face to face diagnostic evaluation of this patient. Additionally, I have examined the patient's medical record including any pertinent labs and radiographic images. If the physician assistant has documented in this note, I have reviewed and edited or otherwise concur with the physician assistant's documentation.   Thanks,   Bea Lime, DO 12/22/2023

## 2023-12-25 NOTE — H&P (Signed)
 Physical Medicine and Rehabilitation Admission H&P        Chief Complaint  Patient presents with   rt foot swollen  : HPI: Kevin Bradford is a 78 year old right handed male with history significant for poorly controlled diabetes mellitus with peripheral neuropathy, history of CVA, CKD stage II, left BKA 12/27/2017 as well as requiring revision due to dehiscence of wound and received CIR 11/24/2017 - 11/30/2017.  Per chart review patient lives with spouse.  Two-level home bed and bath main level one-step to entry.  Independent/active with left prosthesis independent ADLs and driving.  Good family support.  Presented 12/15/2023 with right foot infection/abscess with calcaneal osteomyelitis followed by orthopedic services and no change with conservative care requiring right BKA 12/20/2023 per Dr. Julio Ohm.  Wound VAC as directed.  Lovenox  for DVT prophylaxis.  Patient remained on chronic Plavix  for history of CVA.  Acute blood loss anemia 9.6 and monitored.  Renal function remained stable with latest creatinine 1.07.  Has not had BM in few days.  Reports pain is controlled.  Therapy evaluations completed due to patient's decreased functional mobility was admitted for a comprehensive rehab program.   Review of Systems  Constitutional:  Negative for chills and fever.  HENT:  Negative for congestion and hearing loss.   Eyes:  Positive for blurred vision. Negative for double vision.  Respiratory:  Negative for cough, shortness of breath and wheezing.   Cardiovascular:  Positive for leg swelling. Negative for chest pain and palpitations.  Gastrointestinal:  Positive for constipation. Negative for heartburn, nausea and vomiting.  Genitourinary:  Negative for dysuria, flank pain and hematuria.  Musculoskeletal:  Positive for joint pain and myalgias.  Skin:  Negative for rash.  Neurological:  Positive for sensory change and focal weakness.  Psychiatric/Behavioral:  The patient has insomnia.   All other  systems reviewed and are negative.       Past Medical History:  Diagnosis Date   Arthritis      "joint pain" (08/16/2017)   Below knee amputation (HCC)     Cellulitis and abscess of leg 10/18/2014   CKD (chronic kidney disease) stage 3, GFR 30-59 ml/min (HCC)      creatinine increases with antibiotics per pt, no known kidney disease per patient   Dehiscence of closure of skin, subsequent encounter     Diabetic foot ulcer (HCC)      left   Diabetic peripheral neuropathy (HCC)     GERD (gastroesophageal reflux disease)      12/26/2017- "not this year."    Osteomyelitis (HCC)      left transmetatarsal    Stroke (HCC)     Type II diabetes mellitus (HCC)               Past Surgical History:  Procedure Laterality Date   AMPUTATION Left 07/19/2017    Procedure: LEFT TRANSMETATARSAL AMPUTATION;  Surgeon: Timothy Ford, MD;  Location: [facility] OR;  Service: Orthopedics;  Laterality: Left;   AMPUTATION Left 08/16/2017    REVISION LEFT TRANSMETATARSAL AMPUTATION   AMPUTATION Left 11/22/2017    Procedure: LEFT BELOW KNEE AMPUTATION;  Surgeon: Timothy Ford, MD;  Location: [facility] OR;  Service: Orthopedics;  Laterality: Left;   AMPUTATION Right 12/20/2023    Procedure: AMPUTATION RIGHT BELOW KNEE;  Surgeon: Timothy Ford, MD;  Location: [facility] OR;  Service: Orthopedics;  Laterality: Right;   CATARACT EXTRACTION W/ INTRAOCULAR LENS  IMPLANT, BILATERAL   ~ 2016   EYE  SURGERY Bilateral      catarct   I & D EXTREMITY Left 10/17/2014    Procedure: IRRIGATION AND DEBRIDEMENT EXTREMITY LEFT FOOT;  Surgeon: Timothy Ford, MD;  Location: [facility] OR;  Service: Orthopedics;  Laterality: Left;   IR ANGIO INTRA EXTRACRAN SEL COM CAROTID INNOMINATE BILAT MOD SED   05/07/2018   IR ANGIO VERTEBRAL SEL SUBCLAVIAN INNOMINATE BILAT MOD SED   05/07/2018   STUMP REVISION Left 08/16/2017    Procedure: REVISION LEFT TRANSMETATARSAL AMPUTATION;  Surgeon: Timothy Ford, MD;  Location: [facility] OR;  Service: Orthopedics;  Laterality: Left;    STUMP REVISION Left 12/27/2017    Procedure: LEFT BELOW KNEE AMPUTATION REVISION;  Surgeon: Timothy Ford, MD;  Location: [facility] OR;  Service: Orthopedics;  Laterality: Left;   TOE AMPUTATION Right 2013    5th toe             Family History  Problem Relation Age of Onset   Dementia Mother          Social History:  reports that he has never smoked. He has never used smokeless tobacco. He reports current alcohol use of about 1.0 standard drink of alcohol per week. He reports that he does not use drugs. Allergies:  Allergies       Allergies  Allergen Reactions   Roxicodone  [Oxycodone ] Anaphylaxis, Shortness Of Breath and Other (See Comments)      Throat closed            Medications Prior to Admission  Medication Sig Dispense Refill   acetaminophen  (TYLENOL ) 500 MG tablet Take 500-1,000 mg by mouth every 6 (six) hours as needed for moderate pain (pain score 4-6), headache or fever.       Ascorbic Acid  (VITAMIN C  PO) Take 1 tablet by mouth daily.       Cholecalciferol (VITAMIN D3 PO) Take 1 capsule by mouth daily.       clopidogrel  (PLAVIX ) 75 MG tablet Take 1 tablet (75 mg total) by mouth daily. 30 tablet 6   diclofenac Sodium (VOLTAREN) 1 % GEL Apply 1 Application topically 4 (four) times daily as needed (pain).       insulin  aspart (NOVOLOG  FLEXPEN) 100 UNIT/ML FlexPen Inject 4 Units into the skin 3 (three) times daily with meals. 15 mL 11   insulin  detemir (LEVEMIR  FLEXTOUCH) 100 UNIT/ML FlexPen Inject 25 Units into the skin daily at 10 pm. 15 mL 2   MAGNESIUM  PO Take 1 tablet by mouth daily.       metFORMIN  (GLUCOPHAGE ) 500 MG tablet Take 1,000 mg by mouth 2 (two) times daily with a meal.       VITAMIN A PO Take 1 capsule by mouth daily.                  Home: Home Living Family/patient expects to be discharged to:: Private residence Living Arrangements: Spouse/significant other Available Help at Discharge: Family, Available 24 hours/day Type of Home: House Home Access:  Stairs to enter [employer] of Steps: 1 Entrance Stairs-Rails: Left, Right Home Layout: Two level, Able to live on main level with bedroom/bathroom, Bed/bath upstairs Alternate Level Stairs-Number of Steps: 16 Alternate Level Stairs-Rails: Left Bathroom Shower/Tub: Engineer, manufacturing systems: Handicapped height Bathroom Accessibility: Yes Home Equipment: Grab bars - tub/shower, Tub bench, Wheelchair - manual, Agricultural consultant (2 wheels) Additional Comments: family members live nearby and able to offer assistance  Lives With: Spouse   Functional History: Prior Function Prior Level of  Function : Independent/Modified Independent, Driving ADLs Comments: Pt Independent with ADLs and IADLs and drives   Functional Status:  Mobility: Bed Mobility Overal bed mobility: Needs Assistance Bed Mobility: Supine to Sit, Sit to Supine, Rolling Rolling: Min assist Sidelying to sit: Mod assist, Used rails, HOB elevated Supine to sit: Min assist, HOB elevated, Used rails Sit to supine: Min assist, HOB elevated, Used rails Transfers Overall transfer level: Needs assistance Equipment used: None (with L LE prosthetic donned) Transfers: Bed to chair/wheelchair/BSC Sit to Stand: Max assist, +2 physical assistance, +2 safety/equipment Bed to/from chair/wheelchair/BSC transfer type:: Lateral/scoot transfer  Lateral/Scoot Transfers: From elevated surface, +2 safety/equipment, Min assist General transfer comment: min-mod +2 assist for lateral scoot bed<>BSC. no slide board available but would improve pt's ability to transfer.   ADL: ADL Overall ADL's : Needs assistance/impaired Eating/Feeding: Independent, Sitting Grooming: Set up, Sitting Upper Body Bathing: Set up, Sitting Lower Body Bathing: Moderate assistance, Sitting/lateral leans, Cueing for compensatory techniques Upper Body Dressing : Contact guard assist, Sitting Lower Body Dressing: Supervision/safety, Moderate assistance,  Sitting/lateral leans Lower Body Dressing Details (indicate cue type and reason): Pt able to donn L LE prosthetic with Supervision. Pt requiring Mod assist to bring clothing up over hips (simulated due to wound vac) and managing lines in sitting lateral leans Toilet Transfer: Moderate assistance, +2 for safety/equipment, Cueing for safety (lateral scoot; drop-arm BSC) Toilet Transfer Details (indicate cue type and reason): simulated to drop-arm recliner Toileting- Clothing Manipulation and Hygiene: Maximal assistance, Bed level General ADL Comments: Pt requiring cues for compensatory strategies and increased safety   Cognition: Cognition Orientation Level: Oriented X4 Cognition Arousal: Alert Behavior During Therapy: WFL for tasks assessed/performed, Anxious (Pt with mild signs of anxiety when attempting STS from EOB)   Physical Exam: Blood pressure 111/68, pulse 72, temperature 98.4 F (36.9 C), temperature source Oral, resp. rate 16, height 6\' 2"  (1.88 m), weight 80 kg, SpO2 95%.     General: No apparent distress HEENT: Head is normocephalic, atraumatic, sclera anicteric, oral mucosa pink and moist, dentition intact Neck: Supple without JVD or lymphadenopathy Heart: Reg rate and rhythm. No murmurs rubs or gallops Chest: CTA bilaterally without wheezes, rales, or rhonchi; no distress Abdomen: Soft, non-tender, non-distended, bowel sounds positive-hypoactive Extremities: B/L BKA, R BKA with limb protector and wound VAC- no drainage in canister Psych: Pt's affect is flat. Pt is cooperative Skin: Clean and intact without signs of breakdown Neuro:   Mental Status: AAOx3, memory intact Speech/Languate:  fluent, follows simple commands CRANIAL NERVES: 2-12 grossly intact     MOTOR: RUE: 5/5 Deltoid, 5/5 Biceps, 5/5 Triceps,5/5 Grip LUE: 5/5 Deltoid, 5/5 Biceps, 5/5 Triceps, 5/5 Grip RLE:  Declines to try to lift today due to recent surgery LLE: HF 5/5, KE 5/5   SENSORY: Normal to  touch all 4 extremities         Lab Results Last 48 Hours        Results for orders placed or performed during the hospital encounter of 12/15/23 (from the past 48 hours)  Glucose, capillary     Status: Abnormal    Collection Time: 12/23/23  6:16 AM  Result Value Ref Range    Glucose-Capillary 111 (H) 70 - 99 mg/dL      Comment: Glucose reference range applies only to samples taken after fasting for at least 8 hours.    Comment 1 Notify RN      Comment 2 Document in Chart    Glucose, capillary  Status: Abnormal    Collection Time: 12/23/23 12:22 PM  Result Value Ref Range    Glucose-Capillary 125 (H) 70 - 99 mg/dL      Comment: Glucose reference range applies only to samples taken after fasting for at least 8 hours.  Glucose, capillary     Status: None    Collection Time: 12/23/23  6:22 PM  Result Value Ref Range    Glucose-Capillary 89 70 - 99 mg/dL      Comment: Glucose reference range applies only to samples taken after fasting for at least 8 hours.  Glucose, capillary     Status: Abnormal    Collection Time: 12/23/23  9:27 PM  Result Value Ref Range    Glucose-Capillary 130 (H) 70 - 99 mg/dL      Comment: Glucose reference range applies only to samples taken after fasting for at least 8 hours.  Basic metabolic panel with GFR     Status: Abnormal    Collection Time: 12/24/23  3:24 AM  Result Value Ref Range    Sodium 136 135 - 145 mmol/L    Potassium 4.2 3.5 - 5.1 mmol/L    Chloride 100 98 - 111 mmol/L    CO2 27 22 - 32 mmol/L    Glucose, Bld 93 70 - 99 mg/dL      Comment: Glucose reference range applies only to samples taken after fasting for at least 8 hours.    BUN 10 8 - 23 mg/dL    Creatinine, Ser 4.09 0.61 - 1.24 mg/dL    Calcium  8.3 (L) 8.9 - 10.3 mg/dL    GFR, Estimated >81 >19 mL/min      Comment: (NOTE) Calculated using the CKD-EPI Creatinine Equation (2021)      Anion gap 9 5 - 15      Comment: Performed at [facility], [address],  [place], [place] [address]  CBC     Status: Abnormal    Collection Time: 12/24/23  3:24 AM  Result Value Ref Range    WBC 4.6 4.0 - 10.5 K/uL    RBC 3.24 (L) 4.22 - 5.81 MIL/uL    Hemoglobin 9.8 (L) 13.0 - 17.0 g/dL    HCT 95.6 (L) 21.3 - 52.0 %    MCV 94.4 80.0 - 100.0 fL    MCH 30.2 26.0 - 34.0 pg    MCHC 32.0 30.0 - 36.0 g/dL    RDW 08.6 57.8 - 46.9 %    Platelets 367 150 - 400 K/uL    nRBC 0.0 0.0 - 0.2 %      Comment: Performed at [facility], [address] [place], [place] [address]  Glucose, capillary     Status: None    Collection Time: 12/24/23  6:06 AM  Result Value Ref Range    Glucose-Capillary 89 70 - 99 mg/dL      Comment: Glucose reference range applies only to samples taken after fasting for at least 8 hours.  Glucose, capillary     Status: Abnormal    Collection Time: 12/24/23 12:29 PM  Result Value Ref Range    Glucose-Capillary 121 (H) 70 - 99 mg/dL      Comment: Glucose reference range applies only to samples taken after fasting for at least 8 hours.  Glucose, capillary     Status: Abnormal    Collection Time: 12/24/23  4:53 PM  Result Value Ref Range    Glucose-Capillary 138 (H) 70 - 99  mg/dL      Comment: Glucose reference range applies only to samples taken after fasting for at least 8 hours.  Glucose, capillary     Status: Abnormal    Collection Time: 12/24/23  9:44 PM  Result Value Ref Range    Glucose-Capillary 138 (H) 70 - 99 mg/dL      Comment: Glucose reference range applies only to samples taken after fasting for at least 8 hours.  Basic metabolic panel with GFR     Status: Abnormal    Collection Time: 12/25/23  3:28 AM  Result Value Ref Range    Sodium 135 135 - 145 mmol/L    Potassium 4.4 3.5 - 5.1 mmol/L    Chloride 101 98 - 111 mmol/L    CO2 25 22 - 32 mmol/L    Glucose, Bld 118 (H) 70 - 99 mg/dL      Comment: Glucose reference range applies only to samples taken after fasting for at least 8 hours.    BUN 9 8 - 23 mg/dL    Creatinine,  Ser 1.19 0.61 - 1.24 mg/dL    Calcium  8.4 (L) 8.9 - 10.3 mg/dL    GFR, Estimated >14 >78 mL/min      Comment: (NOTE) Calculated using the CKD-EPI Creatinine Equation (2021)      Anion gap 9 5 - 15      Comment: Performed at [facility], [address], [place], [place] [address]  CBC     Status: Abnormal    Collection Time: 12/25/23  3:28 AM  Result Value Ref Range    WBC 5.2 4.0 - 10.5 K/uL    RBC 3.22 (L) 4.22 - 5.81 MIL/uL    Hemoglobin 9.6 (L) 13.0 - 17.0 g/dL    HCT 13.0 (L) 86.5 - 52.0 %    MCV 94.7 80.0 - 100.0 fL    MCH 29.8 26.0 - 34.0 pg    MCHC 31.5 30.0 - 36.0 g/dL    RDW 78.4 69.6 - 29.5 %    Platelets 351 150 - 400 K/uL    nRBC 0.0 0.0 - 0.2 %      Comment: Performed at [facility], [address], [place], [place] [address]      Imaging Results (Last 48 hours)  No results found.         Blood pressure 111/68, pulse 72, temperature 98.4 F (36.9 C), temperature source Oral, resp. rate 16, height 6\' 2"  (1.88 m), weight 80 kg, SpO2 95%.   Medical Problem List and Plan: 1. 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  Wound VAC as directed             -patient may not shower due to wound vac             -ELOS/Goals: 7 days, Mod I to Sup with PT/OT             -Admit to CIR 2.  Antithrombotics: -DVT/anticoagulation:  Pharmaceutical: Lovenox              -antiplatelet therapy: Plavix  75 mg daily 3. Pain Management: Tramadol  50 mg every 6 hours 4. Mood/Behavior/Sleep: Melatonin 3 mg nightly.  Provide emotional support             -antipsychotic agents: N/A 5. Neuropsych/cognition: This patient is capable of making decisions on his own behalf. 6. Skin/Wound Care: Routine skin checks             -  Wound vac in place, Dr. Julio Ohm recommend discontinue VAC at time of discharge from hospital 7. Fluids/Electrolytes/Nutrition: Routine in and outs with follow-up chemistries 8.  Acute blood loss anemia.  Follow-up  CBC. HGB 9.6 on 6/9 9.  History of left BKA 2019.  Patient did receive CIR.  Patient does have a prosthesis.  Independent prior to admission with prosthesis 10.  Diabetes mellitus with peripheral neuropathy.  Hemoglobin A1c 12.4.  Currently on NovoLog  6 units 3 times daily, Semglee  25 units nightly.  Check blood sugars AC and at bedtime.... Prior to admission patient on Levemir  25 units daily as well as NovoLog  4 units 3 times daily with meals, Glucophage  1000 mg twice daily CBG (last 3)  Recent Labs (last 2 labs)        Recent Labs    12/25/23 0558 12/25/23 1250 12/25/23 1622  GLUCAP 105* 140* 140*          11.  CKD stage II.  Follow-up chemistries. Cr 1.07 on 6/9 12.  History of CVA.  Continue Plavix  13. Constipation             -Pt declines additional laxative today, says he will consider if no BM tomorrow      Sterling Eisenmenger, PA-C 12/25/2023   I have personally performed a face to face diagnostic evaluation of this patient and formulated the key components of the plan.  Additionally, I have personally reviewed laboratory data, imaging studies, as well as relevant notes and concur with the physician assistant's documentation above.   The patient's status has not changed from the original H&P.  Any changes in documentation from the acute care chart have been noted above.   Lylia Sand, MD

## 2023-12-25 NOTE — Progress Notes (Signed)
 Inpatient Rehab Admissions Coordinator:   [facility] is requesting peer to peer which should be performed today. Will await insurance determination for approval for CIR.   Rehab Admissons Coordinator Journie Howson, [place], [place] [phone]

## 2023-12-25 NOTE — Discharge Summary (Signed)
 Physician Discharge Summary  Kevin Bradford ZOX:[id number] DOB: 1945-11-17 DOA: 12/15/2023  PCP: Jearldine Mina, MD  Admit date: 12/15/2023  Discharge date: 12/25/2023  Admitted From: Home  Disposition:  Acute inpatient rehab.  Recommendations for Outpatient Follow-up:  Follow up with PCP in 1-2 weeks Please obtain BMP/CBC in one week. Advised to follow-up with orthopedics Dr. Julio Ohm as scheduled. Patient is being discharged to acute inpatient rehab.  Home Health: None Equipment/Devices:None  Discharge Condition: Stable CODE STATUS: Full code Diet recommendation: Heart Healthy  Brief [facility] Course: This 78 y.o. male with past medical history significant for poorly controlled diabetes mellitus, history of CVA, CKD stage II, and history of osteomyelitis status post left BKA presented to the hospital with fever and right heel wound.  Had temperature of 102 F at home.  Family also noted to have foul-smelling discharge from the wound.  In the ED, Patient was afebrile.  Had transient tachycardia and mild hypotension.  Creatinine was elevated at 1.4.  WBC elevated at 16, 900.  Lactic acid was 1.1.  X-ray of the right foot without any acute osteomyelitis.   Blood cultures were collected, MRI was obtained, Patient was given Unasyn  and linezolid  and was at the hospital for further evaluation and treatment.  Orthopedic consulted.  Patient underwent right BKA by Dr. Julio Ohm. Patient has wound VAC which will be discontinued at time of discharge. Insurance authorization approved.  Patient is being discharged to acute inpatient rehab.   Discharge Diagnoses:  Principal Problem:   Diabetic infection of right foot (HCC) Active Problems:   CKD (chronic kidney disease) stage 2, GFR 60-89 ml/min   Type 2 diabetes mellitus with peripheral neuropathy (HCC)   History of stroke   Acute osteomyelitis of right calcaneus (HCC)   Cutaneous abscess of right foot   Malnutrition of moderate degree  Diabetic  infection of right foot (HCC): S/P right BKA with ortho, appreciate their assistance. Possible CIR d/c, appreciate TOC team DC wound vac at time of DC Continue Tramadol , Dilaudid  as needed.              CKD (chronic kidney disease) stage 2, GFR 60-89 ml/min: Serum creatinine at baseline.           Monitor renal functions.   Type 2 diabetes mellitus with peripheral neuropathy (HCC) Continue Basal bolus insulin . Continue sliding scale.              History of stroke: Continue Plavix  75 mg daily. No residual deficits.   Acute osteomyelitis of right calcaneus (HCC) Continue management as per ortho.   Acute cough: Suspect atelectasis- rec'd IS which he politely declined for now, states cough not that bothersome   Nutrition Problem: Increased nutrient needs Etiology: wound healing    Discharge Instructions  Discharge Instructions     Call MD for:  persistant dizziness or light-headedness   Complete by: As directed    Call MD for:  redness, tenderness, or signs of infection (pain, swelling, redness, odor or green/yellow discharge around incision site)   Complete by: As directed    Call MD for:  temperature >100.4   Complete by: As directed    Change dressing   Complete by: As directed    Remover vac dressing at discharge and apply dry dressing with black stump shrink er on top of dry dressing.   Diet - low sodium heart healthy   Complete by: As directed    Diet Carb Modified   Complete by: As directed  Discharge instructions   Complete by: As directed    Advised to follow-up with primary care physician in 1 week. Advised to follow-up with orthopedics Dr. Duda IV scheduled. Patient be discharged to acute inpatient rehab.   Discharge wound care:   Complete by: As directed    Wound care at acute rehab   Increase activity slowly   Complete by: As directed       Allergies as of 12/25/2023       Reactions   Roxicodone  [oxycodone ] Anaphylaxis, Shortness Of Breath, Other  (See Comments)   Throat closed        Medication List     TAKE these medications    acetaminophen  500 MG tablet Commonly known as: TYLENOL  Take 500-1,000 mg by mouth every 6 (six) hours as needed for moderate pain (pain score 4-6), headache or fever.   clopidogrel  75 MG tablet Commonly known as: PLAVIX  Take 1 tablet (75 mg total) by mouth daily.   guaiFENesin -dextromethorphan 100-10 MG/5ML syrup Commonly known as: ROBITUSSIN DM Take 15 mLs by mouth every 4 (four) hours as needed for cough.   Levemir  FlexTouch 100 UNIT/ML FlexTouch Pen Generic drug: insulin  detemir Inject 25 Units into the skin daily at 10 pm.   MAGNESIUM  PO Take 1 tablet by mouth daily.   metFORMIN  500 MG tablet Commonly known as: GLUCOPHAGE  Take 1,000 mg by mouth 2 (two) times daily with a meal.   NovoLOG  FlexPen 100 UNIT/ML FlexPen Generic drug: insulin  aspart Inject 4 Units into the skin 3 (three) times daily with meals.   traMADol  50 MG tablet Commonly known as: ULTRAM  Take 1 tablet (50 mg total) by mouth every 6 (six) hours for 3 days.   VITAMIN A PO Take 1 capsule by mouth daily.   VITAMIN C  PO Take 1 tablet by mouth daily.   VITAMIN D3 PO Take 1 capsule by mouth daily.   Voltaren 1 % Gel Generic drug: diclofenac Sodium Apply 1 Application topically 4 (four) times daily as needed (pain).               Discharge Care Instructions  (From admission, onward)           Start     Ordered   12/25/23 0000  Change dressing       Comments: Remover vac dressing at discharge and apply dry dressing with black stump shrink er on top of dry dressing.   12/25/23 5366   12/25/23 0000  Discharge wound care:       Comments: Wound care at acute rehab   12/25/23 1245            Follow-up Information     Timothy Ford, MD Follow up in 1 week(s).   Specialty: Orthopedic Surgery Contact information: [address] [place]  [place] [address] [phone]         Jearldine Mina,  MD Follow up in 1 week(s).   Specialty: Internal Medicine Contact information: [address] [place] [address] [phone]                Allergies  Allergen Reactions   Roxicodone  [Oxycodone ] Anaphylaxis, Shortness Of Breath and Other (See Comments)    Throat closed    Consultations: Orthopeadics   Procedures/Studies: MRI Right foot without contrast Result Date: 12/16/2023 CLINICAL DATA:  Soft tissue infection suspected, foot, xray done EXAM: MRI OF THE RIGHT FOREFOOT WITHOUT CONTRAST TECHNIQUE: Multiplanar, multisequence MR imaging of the right hindfoot was performed. No  intravenous contrast was administered. COMPARISON:  Right foot radiographs dated Dec 16, 2023 at 12:55 a.m. FINDINGS: Bones/Joint/Cartilage Soft tissue ulceration overlying the posterior heel with foci gas tracking through the underlying soft tissues and into the posterior calcaneal tuberosity with associated marrow edema and confluent T1 hypointensity of the posterior calcaneus, compatible with osteomyelitis. No marrow signal abnormality identified elsewhere to suggest osteomyelitis. No joint effusion. Mild degenerative changes of the posterior subtalar joint with joint space narrowing and osteophytosis. Moderate degenerative changes of the talonavicular joint with joint space narrowing and dorsal spurring. Ligaments Medial and lateral ligamentous complex of the ankle is grossly intact. Muscles and Tendons Peroneal: Peroneal longus tendinosis and tenosynovitis, most pronounced at the level of the midfoot, adjacent to the cuboid. Peroneal brevis intact. Posteromedial: Posterior tibialis tendinosis and mild tenosynovitis. Tenosynovitis of the flexor hallucis longus and flexor digitorum longus tendons at the level of the knot of Henry. Anterior: Tibialis anterior tendon intact. Extensor hallucis longus tendon intact Extensor digitorum longus tendon intact. Achilles:  Intact. Plantar Fascia: Thickening  of the central cord of the plantar fascia with edema of plantar calcaneal spurs, suggestive of acute on chronic plantar fasciitis. No discrete intramuscular collection identified. Soft tissue Soft tissue ulceration overlying the posterior heel with associated foci of soft tissue gas tracking throughout the underlying soft tissues with areas of lobulated heterogenous T2 hyperintense fluid signal with internal foci of gas extending posteromedially and posterolaterally along the posterior calcaneal soft tissues measuring approximately 3.2 x 1.0 x 5.5 cm on the right and 3.5 x 1.5 x 3.0 cm on the left. This could represent early abscess formation or organizing phlegmon. Circumferential subcutaneous edema along the visualized right lower extremity extending through the ankle and hindfoot. IMPRESSION: 1. Soft tissue ulceration overlying the posterior heel with associated soft tissue gas and intraosseous gas extending into the calcaneal tuberosity with marrow signal abnormality, compatible with calcaneal osteomyelitis. 2. Lobulated heterogenous T2 hyperintense fluid signal with internal foci of gas extending posteromedially and posterolaterally along the posterior calcaneal soft tissues deep to the ulceration measuring approximately 3.2 x 1.0 x 5.5 cm on the right and 3.5 x 1.5 x 3.0 cm on the left. This could represent early abscess formation or organizing phlegmon. Circumferential subcutaneous edema along the visualized right lower extremity extending through the ankle and hindfoot is most compatible with cellulitis. 3. Peroneal longus tendinosis and tenosynovitis, most pronounced at the level of the midfoot. 4. Marked tenosynovitis of the flexor hallucis longus and flexor digitorum longus tendons at the level of the knot of Henry. Posterior tibialis tendinosis and mild tenosynovitis. 5. Acute on chronic plantar fasciitis. 6. Mild-to-moderate degenerative changes of the hindfoot. Electronically Signed   By: Mannie Seek M.D.   On: 12/16/2023 14:57   DG Chest Port 1 View Result Date: 12/16/2023 EXAM: 1 VIEW XRAY OF THE CHEST 12/16/2023 12:55:00 AM COMPARISON: 12/07/2023 CLINICAL HISTORY: Infection. FINDINGS: LUNGS AND PLEURA: No focal pulmonary opacity. No pulmonary edema. No pleural effusion. No pneumothorax. HEART AND MEDIASTINUM: No acute abnormality of the cardiac and mediastinal silhouettes. BONES AND SOFT TISSUES: No acute osseous abnormality. IMPRESSION: 1. No acute process. Electronically signed by: Zadie Herter MD 12/16/2023 01:04 AM EDT RP Workstation: [id number]   DG Foot Complete Right Result Date: 12/16/2023 EXAM: 3 or more VIEW(S) XRAY OF THE RIGHT FOOT 12/16/2023 12:55:00 AM COMPARISON: None available. CLINICAL HISTORY: Infection. FINDINGS: BONES AND JOINTS: Mild degenerative changes of the first MTP (metatarsophalangeal) joint. Status post fifth digit amputation at the level of  the mid metatarsal shaft. Small posterior and plantar calcaneal enthesophytes. SOFT TISSUES: Mild soft tissue gas overlying the calcaneus, without definite cortical destruction. IMPRESSION: 1. Mild soft tissue gas overlying the calcaneus, without definite cortical destruction to suggest osteomyelitis. 2. Status post fifth digit amputation at the level of the mid metatarsal shaft. Electronically signed by: Zadie Herter MD 12/16/2023 01:03 AM EDT RP Workstation: [id number]   DG Chest Port 1 View Result Date: 12/07/2023 CLINICAL DATA:  Fever and malaise EXAM: PORTABLE CHEST 1 VIEW COMPARISON:  Chest x-ray 05/06/2018 FINDINGS: The heart size and mediastinal contours are within normal limits. Both lungs are clear. The visualized skeletal structures are unremarkable. IMPRESSION: No active disease. Electronically Signed   By: Tyron Gallon M.D.   On: 12/07/2023 21:49   Status post right BKA.   Subjective: Patient seen and examined at bedside.  Overnight events noted. Patient reports feeling much better and wants to be  discharged.   Patient being discharged to acute inpatient rehab.  Discharge Exam: Vitals:   12/25/23 1011 12/25/23 1254  BP: (!) 146/79 138/72  Pulse:  73  Resp:  18  Temp:  97.6 F (36.4 C)  SpO2:  95%   Vitals:   12/25/23 0747 12/25/23 1003 12/25/23 1011 12/25/23 1254  BP: 119/74 (!) 147/83 (!) 146/79 138/72  Pulse: 76   73  Resp: 16   18  Temp: 98.2 F (36.8 C)   97.6 F (36.4 C)  TempSrc: Oral   Oral  SpO2: 98%   95%  Weight:      Height:        General: Pt is alert, awake, not in acute distress Cardiovascular: RRR, S1/S2 +, no rubs, no gallops Respiratory: CTA bilaterally, no wheezing, no rhonchi Abdominal: Soft, NT, ND, bowel sounds + Extremities: no edema, no cyanosis, Right BKA, Left BKA.    The results of significant diagnostics from this hospitalization (including imaging, microbiology, ancillary and laboratory) are listed below for reference.     Microbiology: Recent Results (from the past 240 hours)  Culture, blood (Routine x 2)     Status: None   Collection Time: 12/15/23 11:43 PM   Specimen: BLOOD RIGHT HAND  Result Value Ref Range Status   Specimen Description BLOOD RIGHT HAND  Final   Special Requests   Final    BOTTLES DRAWN AEROBIC AND ANAEROBIC Blood Culture results may not be optimal due to an inadequate volume of blood received in culture bottles   Culture   Final    NO GROWTH 5 DAYS Performed at [facility], [address] [place], [place] [address]    Report Status 12/21/2023 FINAL  Final  Culture, blood (Routine x 2)     Status: None   Collection Time: 12/15/23 11:43 PM   Specimen: BLOOD RIGHT HAND  Result Value Ref Range Status   Specimen Description BLOOD RIGHT HAND  Final   Special Requests   Final    BOTTLES DRAWN AEROBIC ONLY Blood Culture adequate volume   Culture   Final    NO GROWTH 5 DAYS Performed at [facility], [address], [place], [place] [address]    Report Status 12/21/2023 FINAL  Final  Surgical  PCR screen     Status: None   Collection Time: 12/19/23  7:50 PM   Specimen: Nasal Mucosa; Nasal Swab  Result Value Ref Range Status   MRSA, PCR NEGATIVE NEGATIVE Final   Staphylococcus aureus NEGATIVE NEGATIVE Final  Comment: (NOTE) The Xpert SA Assay (FDA approved for NASAL specimens in patients 65 years of age and older), is one component of a comprehensive surveillance program. It is not intended to diagnose infection nor to guide or monitor treatment. Performed at [facility], [address] [place], [place] [address]      Labs: BNP (last 3 results) No results for input(s): "BNP" in the last 8760 hours. Basic Metabolic Panel: Recent Labs  Lab 12/21/23 0304 12/22/23 0322 12/23/23 0253 12/24/23 0324 12/25/23 0328  NA 132* 134* 132* 136 135  K 4.3 4.0 4.0 4.2 4.4  CL 97* 99 98 100 101  CO2 22 28 28 27 25   GLUCOSE 331* 193* 109* 93 118*  BUN 19 16 14 10 9   CREATININE 1.27* 1.22 1.12 1.09 1.07  CALCIUM  8.4* 8.3* 7.6* 8.3* 8.4*  MG 2.0 2.0 2.0  --   --   PHOS  --  3.1  --   --   --    Liver Function Tests: Recent Labs  Lab 12/20/23 0309  AST 28  ALT 24  ALKPHOS 112  BILITOT 1.1  PROT 5.7*  ALBUMIN 1.9*   No results for input(s): "LIPASE", "AMYLASE" in the last 168 hours. No results for input(s): "AMMONIA" in the last 168 hours. CBC: Recent Labs  Lab 12/20/23 0309 12/21/23 0304 12/22/23 0322 12/23/23 0253 12/24/23 0324 12/25/23 0328  WBC 11.1* 9.5 9.5 6.1 4.6 5.2  NEUTROABS 9.2*  --   --   --   --   --   HGB 9.9* 10.1* 9.8* 9.9* 9.8* 9.6*  HCT 30.0* 30.5* 30.1* 30.4* 30.6* 30.5*  MCV 92.3 94.4 94.7 94.4 94.4 94.7  PLT 415* 388 418* 405* 367 351   Cardiac Enzymes: No results for input(s): "CKTOTAL", "CKMB", "CKMBINDEX", "TROPONINI" in the last 168 hours. BNP: Invalid input(s): "POCBNP" CBG: Recent Labs  Lab 12/24/23 1229 12/24/23 1653 12/24/23 2144 12/25/23 0558 12/25/23 1250  GLUCAP 121* 138* 138* 105* 140*   D-Dimer No results  for input(s): "DDIMER" in the last 72 hours. Hgb A1c No results for input(s): "HGBA1C" in the last 72 hours. Lipid Profile No results for input(s): "CHOL", "HDL", "LDLCALC", "TRIG", "CHOLHDL", "LDLDIRECT" in the last 72 hours. Thyroid  function studies No results for input(s): "TSH", "T4TOTAL", "T3FREE", "THYROIDAB" in the last 72 hours.  Invalid input(s): "FREET3" Anemia work up No results for input(s): "VITAMINB12", "FOLATE", "FERRITIN", "TIBC", "IRON", "RETICCTPCT" in the last 72 hours. Urinalysis    Component Value Date/Time   COLORURINE YELLOW 12/07/2023 2238   APPEARANCEUR CLEAR 12/07/2023 2238   LABSPEC 1.027 12/07/2023 2238   PHURINE 5.5 12/07/2023 2238   GLUCOSEU >1,000 (A) 12/07/2023 2238   HGBUR NEGATIVE 12/07/2023 2238   BILIRUBINUR NEGATIVE 12/07/2023 2238   KETONESUR >80 (A) 12/07/2023 2238   PROTEINUR 30 (A) 12/07/2023 2238   NITRITE NEGATIVE 12/07/2023 2238   LEUKOCYTESUR NEGATIVE 12/07/2023 2238   Sepsis Labs Recent Labs  Lab 12/22/23 0322 12/23/23 0253 12/24/23 0324 12/25/23 0328  WBC 9.5 6.1 4.6 5.2   Microbiology Recent Results (from the past 240 hours)  Culture, blood (Routine x 2)     Status: None   Collection Time: 12/15/23 11:43 PM   Specimen: BLOOD RIGHT HAND  Result Value Ref Range Status   Specimen Description BLOOD RIGHT HAND  Final   Special Requests   Final    BOTTLES DRAWN AEROBIC AND ANAEROBIC Blood Culture results may not be optimal due to an inadequate volume of blood  received in culture bottles   Culture   Final    NO GROWTH 5 DAYS Performed at [facility], [address], [place], [place] [address]    Report Status 12/21/2023 FINAL  Final  Culture, blood (Routine x 2)     Status: None   Collection Time: 12/15/23 11:43 PM   Specimen: BLOOD RIGHT HAND  Result Value Ref Range Status   Specimen Description BLOOD RIGHT HAND  Final   Special Requests   Final    BOTTLES DRAWN AEROBIC ONLY Blood Culture adequate volume    Culture   Final    NO GROWTH 5 DAYS Performed at [facility], [address] [place], [place] [address]    Report Status 12/21/2023 FINAL  Final  Surgical PCR screen     Status: None   Collection Time: 12/19/23  7:50 PM   Specimen: Nasal Mucosa; Nasal Swab  Result Value Ref Range Status   MRSA, PCR NEGATIVE NEGATIVE Final   Staphylococcus aureus NEGATIVE NEGATIVE Final    Comment: (NOTE) The Xpert SA Assay (FDA approved for NASAL specimens in patients 27 years of age and older), is one component of a comprehensive surveillance program. It is not intended to diagnose infection nor to guide or monitor treatment. Performed at [facility], [address], [place], [place] [address]      Time coordinating discharge: Over 30 minutes  SIGNED:   Magdalene School, MD  [facility] 12/25/2023, 1:58 PM Pager   If 7PM-7AM, please contact night-coverage

## 2023-12-25 NOTE — Progress Notes (Signed)
 Attempted to get report from nurse. 4E-nurse to call back.  Kevin Bradford

## 2023-12-25 NOTE — Discharge Instructions (Signed)
 Advised to follow-up with primary care physician in 1 week. Advised to follow-up with orthopedics Dr. Duda IV scheduled. Patient be discharged to acute inpatient rehab.

## 2023-12-25 NOTE — Discharge Summary (Signed)
 Physician Discharge Summary  Patient ID: Kevin Bradford MRN: [id number] DOB/AGE: January 18, 1946 78 y.o.  Admit date: 12/25/2023 Discharge date: 01/06/2024  Discharge Diagnoses:  Principal Problem:   Right below-knee amputee [facility]) DVT prophylaxis Pain management Acute blood loss anemia History of left BKA 2019 Diabetes mellitus with peripheral neuropathy CKD stage II History of CVA Constipation  Discharged Condition: Stable  Significant Diagnostic Studies: MRI Right foot without contrast Result Date: 12/16/2023 CLINICAL DATA:  Soft tissue infection suspected, foot, xray done EXAM: MRI OF THE RIGHT FOREFOOT WITHOUT CONTRAST TECHNIQUE: Multiplanar, multisequence MR imaging of the right hindfoot was performed. No intravenous contrast was administered. COMPARISON:  Right foot radiographs dated Dec 16, 2023 at 12:55 a.m. FINDINGS: Bones/Joint/Cartilage Soft tissue ulceration overlying the posterior heel with foci gas tracking through the underlying soft tissues and into the posterior calcaneal tuberosity with associated marrow edema and confluent T1 hypointensity of the posterior calcaneus, compatible with osteomyelitis. No marrow signal abnormality identified elsewhere to suggest osteomyelitis. No joint effusion. Mild degenerative changes of the posterior subtalar joint with joint space narrowing and osteophytosis. Moderate degenerative changes of the talonavicular joint with joint space narrowing and dorsal spurring. Ligaments Medial and lateral ligamentous complex of the ankle is grossly intact. Muscles and Tendons Peroneal: Peroneal longus tendinosis and tenosynovitis, most pronounced at the level of the midfoot, adjacent to the cuboid. Peroneal brevis intact. Posteromedial: Posterior tibialis tendinosis and mild tenosynovitis. Tenosynovitis of the flexor hallucis longus and flexor digitorum longus tendons at the level of the knot of Henry. Anterior: Tibialis anterior tendon intact. Extensor hallucis  longus tendon intact Extensor digitorum longus tendon intact. Achilles:  Intact. Plantar Fascia: Thickening of the central cord of the plantar fascia with edema of plantar calcaneal spurs, suggestive of acute on chronic plantar fasciitis. No discrete intramuscular collection identified. Soft tissue Soft tissue ulceration overlying the posterior heel with associated foci of soft tissue gas tracking throughout the underlying soft tissues with areas of lobulated heterogenous T2 hyperintense fluid signal with internal foci of gas extending posteromedially and posterolaterally along the posterior calcaneal soft tissues measuring approximately 3.2 x 1.0 x 5.5 cm on the right and 3.5 x 1.5 x 3.0 cm on the left. This could represent early abscess formation or organizing phlegmon. Circumferential subcutaneous edema along the visualized right lower extremity extending through the ankle and hindfoot. IMPRESSION: 1. Soft tissue ulceration overlying the posterior heel with associated soft tissue gas and intraosseous gas extending into the calcaneal tuberosity with marrow signal abnormality, compatible with calcaneal osteomyelitis. 2. Lobulated heterogenous T2 hyperintense fluid signal with internal foci of gas extending posteromedially and posterolaterally along the posterior calcaneal soft tissues deep to the ulceration measuring approximately 3.2 x 1.0 x 5.5 cm on the right and 3.5 x 1.5 x 3.0 cm on the left. This could represent early abscess formation or organizing phlegmon. Circumferential subcutaneous edema along the visualized right lower extremity extending through the ankle and hindfoot is most compatible with cellulitis. 3. Peroneal longus tendinosis and tenosynovitis, most pronounced at the level of the midfoot. 4. Marked tenosynovitis of the flexor hallucis longus and flexor digitorum longus tendons at the level of the knot of Henry. Posterior tibialis tendinosis and mild tenosynovitis. 5. Acute on chronic plantar  fasciitis. 6. Mild-to-moderate degenerative changes of the hindfoot. Electronically Signed   By: Mannie Seek M.D.   On: 12/16/2023 14:57   DG Chest Port 1 View Result Date: 12/16/2023 EXAM: 1 VIEW XRAY OF THE CHEST 12/16/2023 12:55:00 AM COMPARISON: 12/07/2023 CLINICAL HISTORY: Infection.  FINDINGS: LUNGS AND PLEURA: No focal pulmonary opacity. No pulmonary edema. No pleural effusion. No pneumothorax. HEART AND MEDIASTINUM: No acute abnormality of the cardiac and mediastinal silhouettes. BONES AND SOFT TISSUES: No acute osseous abnormality. IMPRESSION: 1. No acute process. Electronically signed by: Zadie Herter MD 12/16/2023 01:04 AM EDT RP Workstation: [id number]   DG Foot Complete Right Result Date: 12/16/2023 EXAM: 3 or more VIEW(S) XRAY OF THE RIGHT FOOT 12/16/2023 12:55:00 AM COMPARISON: None available. CLINICAL HISTORY: Infection. FINDINGS: BONES AND JOINTS: Mild degenerative changes of the first MTP (metatarsophalangeal) joint. Status post fifth digit amputation at the level of the mid metatarsal shaft. Small posterior and plantar calcaneal enthesophytes. SOFT TISSUES: Mild soft tissue gas overlying the calcaneus, without definite cortical destruction. IMPRESSION: 1. Mild soft tissue gas overlying the calcaneus, without definite cortical destruction to suggest osteomyelitis. 2. Status post fifth digit amputation at the level of the mid metatarsal shaft. Electronically signed by: Zadie Herter MD 12/16/2023 01:03 AM EDT RP Workstation: [id number]   DG Chest Port 1 View Result Date: 12/07/2023 CLINICAL DATA:  Fever and malaise EXAM: PORTABLE CHEST 1 VIEW COMPARISON:  Chest x-ray 05/06/2018 FINDINGS: The heart size and mediastinal contours are within normal limits. Both lungs are clear. The visualized skeletal structures are unremarkable. IMPRESSION: No active disease. Electronically Signed   By: Tyron Gallon M.D.   On: 12/07/2023 21:49    Labs:  Basic Metabolic Panel: Recent Labs  Lab  12/19/23 0327 12/20/23 0309 12/21/23 0304 12/22/23 0322 12/23/23 0253 12/24/23 0324 12/25/23 0328  NA 134* 133* 132* 134* 132* 136 135  K 3.7 3.9 4.3 4.0 4.0 4.2 4.4  CL 98 97* 97* 99 98 100 101  CO2 25 25 22 28 28 27 25   GLUCOSE 179* 167* 331* 193* 109* 93 118*  BUN 8 8 19 16 14 10 9   CREATININE 1.10 1.07 1.27* 1.22 1.12 1.09 1.07  CALCIUM  8.5* 8.5* 8.4* 8.3* 7.6* 8.3* 8.4*  MG  --   --  2.0 2.0 2.0  --   --   PHOS  --   --   --  3.1  --   --   --     CBC: Recent Labs  Lab 12/20/23 0309 12/21/23 0304 12/23/23 0253 12/24/23 0324 12/25/23 0328  WBC 11.1*   < > 6.1 4.6 5.2  NEUTROABS 9.2*  --   --   --   --   HGB 9.9*   < > 9.9* 9.8* 9.6*  HCT 30.0*   < > 30.4* 30.6* 30.5*  MCV 92.3   < > 94.4 94.4 94.7  PLT 415*   < > 405* 367 351   < > = values in this interval not displayed.    CBG: Recent Labs  Lab 12/24/23 2144 12/25/23 0558 12/25/23 1250 12/25/23 1622 12/25/23 1726  GLUCAP 138* 105* 140* 140* 135*   Family history.  Mother with dementia.  Denies any colon cancer esophageal cancer or rectal cancer  Brief HPI:   Kevin Bradford is a 78 y.o. right-handed male with history significant for poorly controlled diabetes mellitus with peripheral neuropathy history of CVA CKD stage II left BKA 12/27/2017 as well as requiring revision due to dehiscence of wound received CIR 11/24/2017 - 11/30/2017.  Per chart review lives with spouse two-level home bed and bath main level with one-step to entry.  Independent and active the left prosthesis and driving.  Good family support.  Presented 12/15/2023 with right foot infection/abscess with calcaneal osteomyelitis followed orthopedic  services and no change with conservative care requiring right BKA 12/20/2023 per Dr. Julio Ohm.  Wound VAC as directed.  Lovenox  for DVT prophylaxis.  Patient remained on chronic Plavix  for history of CVA.  Acute blood loss anemia 9.6 and monitored.  Renal function remained stable latest creatinine 1.07.  Therapy  evaluations completed due to patient decreased functional mobility was admitted for a comprehensive rehab program.   Hospital Course: Kevin Bradford was admitted to rehab 12/25/2023 for inpatient therapies to consist of PT, ST and OT at least three hours five days a week. Past admission physiatrist, therapy team and rehab RN have worked together to provide customized collaborative inpatient rehab.  Pertaining to patient's right BKA due to abscess/calcaneal osteomyelitis 12/20/2023.  Wound VAC as directed follow-up per Dr. Due to orthopedic services.  Lovenox  for DVT prophylaxis.  Patient remained on Plavix  for history of CVA no bleeding episodes.  Pain management use of tramadol  up titrated as needed.  He was using melatonin to help aid in sleep.  History of left BKA 2019 patient did have a prosthesis BKA site to left lower extremity well-healed.  Blood sugars monitored hemoglobin A1c 12.4 insulin  therapy as directed diabetic teaching patient had been on Levemir  NovoLog  and Glucophage  prior to admission will need outpatient follow-up.  CKD stage II follow-up chemistries stable.  Bouts of constipation resolved with laxative assistance.   Blood pressures were monitored on TID basis and controlled monitored  Diabetes has been monitored with ac/hs CBG checks and SSI was use prn for tighter BS control.    Rehab course: During patient's stay in rehab weekly team conferences were held to monitor patient's progress, set goals and discuss barriers to discharge. At admission, patient required minimal assist supine to sit minimal assist sit to supine minimal assist sit to stand  He/She  has had improvement in activity tolerance, balance, postural control as well as ability to compensate for deficits. He/She has had improvement in functional use RUE/LUE  and RLE/LLE as well as improvement in awareness.  Perform wheelchair mobility 150 feet using bilateral upper extremities with supervision with emphasis on upper  extremity strength and coordination today room.  Patient/wife able to set up transfer to mat with 1 cue to remove leg rest completely prior to transferring.  Perform lateral scoot on/off mat with close supervision with wife utilizing wheelchair and with increased time and maximal encouragement patient agreed to attempt to stand.  He was somewhat fearful of falling.  Completed bed mobility with supervision using bed rails.  Right limb guard in place donned left prosthesis independently.  Completed contact-guard lateral scoot to wheelchair during ADLs.  Full family teaching completed plan discharge to home       Disposition:  There are no questions and answers to display.         Diet: Diabetic diet  Special Instructions: No driving smoking or alcohol  Medications at discharge. 1.  Tylenol  as needed 2.  Vitamin C  500 mg p.o. twice daily 3.  Plavix  75 mg p.o. daily 4.  Melatonin 3 mg nightly 5.  Multivitamin daily 6.  Protonix  40 mg daily 7.  Tramadol  50 mg every 6 hours as needed 8.  Zinc  sulfate 220 mg p.o. daily 9.  Levemir  25 units daily 10.  NovoLog  4 units 3 times daily with meals 11.  Glucophage  1000 mg twice daily 12.  Magnesium  1 tablet daily 13.  Vitamin D 1 capsule daily 14.  Voltaren gel 4 times daily as needed 15.  Colace 100 mg daily 16.  Vitamin A 1 capsule daily  30-35 minutes were spent completing discharge summary and discharge planning     Follow-up Information     Raulkar, Keven Pel, MD Follow up.   Specialty: Physical Medicine and Rehabilitation Why: Office to call for appointment Contact information: [address] [place] [address] [phone]         Timothy Ford, MD Follow up.   Specialty: Orthopedic Surgery Why: Call for appointment Contact information: [address] [place]  [place] [address] [phone]                 Signed: Everlyn Hockey Jessyca Sloan 12/25/2023, 5:29 PM

## 2023-12-25 NOTE — Discharge Instructions (Addendum)
 Inpatient Rehab Discharge Instructions  Kevin Bradford Discharge date and time: No discharge date for patient encounter.   Activities/Precautions/ Functional Status: Activity: As tolerated Diet: Diabetic diet Wound Care: Cleanse incision daily warm soap and water using Dial soap Functional status:  ___ No restrictions     ___ Walk up steps independently ___ 24/7 supervision/assistance   ___ Walk up steps with assistance ___ Intermittent supervision/assistance  ___ Bathe/dress independently ___ Walk with walker     _x__ Bathe/dress with assistance ___ Walk Independently    ___ Shower independently ___ Walk with assistance    ___ Shower with assistance ___ No alcohol     ___ Return to work/school ________  Special Instructions: No driving smoking or alcohol   COMMUNITY REFERRALS UPON DISCHARGE:    Home Health:   PT    OT      RN                 Agency:[facility]   Phone:[phone]    Medical Equipment/Items Ordered:DROP-ARM BEDSIDE COMMODE AND WHEELCHAIR                                                 Agency/Supplier:[facility]   [phone]    My questions have been answered and I understand these instructions. I will adhere to these goals and the provided educational materials after my discharge from the hospital.  Patient/Caregiver Signature _______________________________ Date __________  Clinician Signature _______________________________________ Date __________  Please bring this form and your medication list with you to all your follow-up doctor's appointments.

## 2023-12-25 NOTE — Progress Notes (Signed)
 PMR Admission Coordinator Pre-Admission Assessment   Patient: Kevin Bradford is an 78 y.o., male MRN: [id number] DOB: 13-Sep-1945 Height: 6\' 2"  (188 cm) Weight: 83.1 kg                                                                                                           Insurance Information HMO:     PPO: yes     PCP:      IPA:      80/20:      OTHER:  PRIMARY: [employer] PPO      Policy#: [id number]; Medicare *[id number]      Subscriber: pt CM Name: Georganne Kind      Phone#: [phone]     Fax#: [phone] Pre-Cert#: [id number] 6/9-6/14  f/u CM Salina Craver phone [phone] fax [phone]     Benefits:  Phone #: [phone]     Name: 6/6 Eff. Date: 09/16/2023     Deduct: none      Out of Pocket Max: none      Life Max: none  CIR: 100%      SNF: 100% Outpatient: 100%      Home Health: 100%    DME: 100%   Providers: in network  SECONDARY: none   Financial Counselor:       Phone#:    The Data processing manager" for patients in Inpatient Rehabilitation Facilities with attached "Privacy Act Statement-Health Care Records" was provided and verbally reviewed with: Patient and Family   Emergency Contact Information Contact Information       Name Relation Home Work Mobile    Spurrier,[name] Spouse     [phone]         Other Contacts   None on File      Current Medical History  Patient Admitting Diagnosis: Right BKA   History of Present Illness: Kevin Bradford is a 78 y.o. male with a past medical history of Arthritis, Below knee amputation , Cellulitis and abscess of leg (10/18/2014), CKD stage 3, GFR 30-59 ml/min , Dehiscence of closure of skin, subsequent encounter, Diabetic foot ulcer , Diabetic peripheral neuropathy , GERD, Osteomyelitis, Stroke, and Type II diabetes mellitus . [name] He was admitted to [facility] on 12/15/2023 for foul-smelling discharge from a wound on his right heel, found on ED eval to have acute  osteomyelitis.     He was started on Unasyn  and linezolid , and was admitted for further evaluation.[name]  He went underwent right BKA with Dr. Julio Ohm on 6/4.  Hospitalization has been otherwise complicated by uncontrolled diabetes A1c 13.7, AKI on CKD stage II, antibiotics for osteomyelitis which are finished after 4 negative blood cultures, postop wound care, poor pain control managed with IV Dilaudid , constipation, and history of CVA on Plavix  pending restart.    Patient's medical record from [facility] has been reviewed by the rehabilitation admission coordinator and physician.   Past Medical History      Past Medical History:  Diagnosis Date   Arthritis      "  joint pain" (08/16/2017)   Below knee amputation (HCC)     Cellulitis and abscess of leg 10/18/2014   CKD (chronic kidney disease) stage 3, GFR 30-59 ml/min (HCC)      creatinine increases with antibiotics per pt, no known kidney disease per patient   Dehiscence of closure of skin, subsequent encounter     Diabetic foot ulcer (HCC)      left   Diabetic peripheral neuropathy (HCC)     GERD (gastroesophageal reflux disease)      12/26/2017- "not this year."    Osteomyelitis (HCC)      left transmetatarsal    Stroke (HCC)     Type II diabetes mellitus (HCC)          Has the patient had major surgery during 100 days prior to admission? Yes   Family History  family history includes Dementia in his mother.   Current Medications   Current Medications    Current Facility-Administered Medications:    acetaminophen  (TYLENOL ) tablet 325-650 mg, 325-650 mg, Oral, Q6H PRN, Butch Cashing, PA-C, 650 mg at 12/23/23 0846   alum & mag hydroxide-simeth (MAALOX/MYLANTA) 200-200-20 MG/5ML suspension 15-30 mL, 15-30 mL, Oral, Q2H PRN, Hazeline Lister, Emma M, PA-C   ascorbic acid  (VITAMIN C ) tablet 500 mg, 500 mg, Oral, BID, Arnaldo Betters M, PA-C, 500 mg at 12/24/23 1610   clopidogrel  (PLAVIX ) tablet 75 mg, 75 mg, Oral, Daily, Pokhrel, Laxman,  MD, 75 mg at 12/24/23 0904   docusate sodium  (COLACE) capsule 100 mg, 100 mg, Oral, Daily, Arnaldo Betters M, PA-C, 100 mg at 12/23/23 0847   enoxaparin  (LOVENOX ) injection 40 mg, 40 mg, Subcutaneous, Daily, Pokhrel, Laxman, MD, 40 mg at 12/24/23 9604   guaiFENesin -dextromethorphan (ROBITUSSIN DM) 100-10 MG/5ML syrup 15 mL, 15 mL, Oral, Q4H PRN, Hazeline Lister, Emma M, PA-C   hydrALAZINE  (APRESOLINE ) injection 5 mg, 5 mg, Intravenous, Q20 Min PRN, Hazeline Lister, Emma M, PA-C   HYDROmorphone  (DILAUDID ) injection 0.5-1 mg, 0.5-1 mg, Intravenous, Q4H PRN, Jobe Mulder, DO, 0.5 mg at 12/24/23 5409   insulin  aspart (novoLOG ) injection 0-9 Units, 0-9 Units, Subcutaneous, TID AC & HS, Collins, Emma M, PA-C, 1 Units at 12/24/23 1718   insulin  aspart (novoLOG ) injection 6 Units, 6 Units, Subcutaneous, TID WC, Pokhrel, Laxman, MD, 6 Units at 12/23/23 1305   insulin  glargine-yfgn (SEMGLEE ) injection 25 Units, 25 Units, Subcutaneous, QHS, Pokhrel, Laxman, MD, 25 Units at 12/23/23 2116   labetalol  (NORMODYNE ) injection 10 mg, 10 mg, Intravenous, Q10 min PRN, Hazeline Lister, Emma M, PA-C   magnesium  sulfate IVPB 2 g 50 mL, 2 g, Intravenous, Daily PRN, Hazeline Lister, Emma M, PA-C   melatonin tablet 3 mg, 3 mg, Oral, QHS, Collins, Emma M, PA-C, 3 mg at 12/23/23 2116   metoprolol  tartrate (LOPRESSOR ) injection 2-5 mg, 2-5 mg, Intravenous, Q2H PRN, Hazeline Lister, Emma M, PA-C   multivitamin with minerals tablet 1 tablet, 1 tablet, Oral, Daily, Arnaldo Betters M, PA-C, 1 tablet at 12/24/23 8119   mupirocin  ointment (BACTROBAN ) 2 % 1 Application, 1 Application, Nasal, BID, Butch Cashing, PA-C, 1 Application at 12/24/23 1478   pantoprazole  (PROTONIX ) EC tablet 40 mg, 40 mg, Oral, Daily, Arnaldo Betters M, PA-C, 40 mg at 12/24/23 2956   phenol (CHLORASEPTIC) mouth spray 1 spray, 1 spray, Mouth/Throat, PRN, Hazeline Lister, Emma M, PA-C   potassium chloride  SA (KLOR-CON  M) CR tablet 20-40 mEq, 20-40 mEq, Oral, Daily PRN, Hazeline Lister, Emma M, PA-C    prochlorperazine  (COMPAZINE ) injection 5 mg, 5 mg, Intravenous, Q6H PRN, Hazeline Lister,  Ewell Holes, PA-C   protein supplement (ENSURE MAX) liquid, 11 oz, Oral, BID, Arnaldo Betters M, PA-C, 11 oz at 12/23/23 6962   senna-docusate (Senokot-S) tablet 1 tablet, 1 tablet, Oral, QHS PRN, Butch Cashing, PA-C, 1 tablet at 12/23/23 2116   sodium chloride  flush (NS) 0.9 % injection 3 mL, 3 mL, Intravenous, Q12H, Collins, Emma M, PA-C, 3 mL at 12/24/23 0908   traMADol  (ULTRAM ) tablet 50 mg, 50 mg, Oral, Q6H, Collins, Emma M, PA-C, 50 mg at 12/24/23 1718   zinc  sulfate (50mg  elemental zinc ) capsule 220 mg, 220 mg, Oral, Daily, Arnaldo Betters M, PA-C, 220 mg at 12/24/23 9528     Patients Current Diet:  Diet Order                  Diet Carb Modified Fluid consistency: Thin; Room service appropriate? 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  Independent   Indoor Mobility: Did the patient need assistance with walking from room to room (with or without device)? Independent   Stairs: Did the patient need assistance with internal or external stairs (with or without device)? Independent   Functional  Cognition: Did the patient need help planning regular tasks such as shopping or remembering to take medications? Independent   Patient Information Are you of Hispanic, Latino/a,or Spanish origin?: A. No, not of Hispanic, Latino/a, or Spanish origin What is your race?: A. White Do you need or want an interpreter to communicate with a doctor or health care staff?: 0. No   Patient's Response To:  Health Literacy and Transportation Is the patient able to respond to health literacy and transportation needs?: Yes Health Literacy - How often do you need to have someone help you when you read instructions, pamphlets, or other written material from your doctor or pharmacy?: Never In the past 12 months, has lack of transportation kept you from medical appointments or from getting medications?: No In the past 12 months, has lack of transportation kept you from meetings, work, or from getting things needed for daily living?: No   Home Assistive Devices / Equipment Home Equipment: Grab bars - tub/shower, Tub bench, Wheelchair - manual, Agricultural consultant (2 wheels)   Prior Device Use: Indicate devices/aids used by the patient prior to current illness, exacerbation or injury? LLE prosthesis, RW   Current Functional Level Cognition   Orientation Level: Oriented X4    Extremity Assessment (includes Sensation/Coordination)   Upper Extremity Assessment: Defer to OT evaluation, Right hand dominant  Lower Extremity Assessment: Generalized weakness, RLE deficits/detail, LLE deficits/detail RLE Deficits / Details: residual limb CDI with shrinker in place and limb protector on.  LLE Deficits / Details: skin CDI of residual limb, ROM WFL at hip and knee     ADLs   Overall ADL's : Needs assistance/impaired Eating/Feeding: Independent, Sitting Grooming: Set up, Sitting Upper Body Bathing: Set up, Sitting Lower Body Bathing: Moderate assistance, Sitting/lateral leans, Cueing for compensatory techniques Upper  Body Dressing : Contact guard assist, Sitting Lower Body Dressing: Supervision/safety, Moderate assistance, Sitting/lateral leans Lower Body Dressing Details (indicate cue type and reason): Pt able to donn L LE prosthetic with Supervision. Pt requiring Mod assist to bring clothing up over hips (simulated due to wound vac) and managing lines in sitting lateral leans Toilet Transfer: Moderate assistance, +2 for safety/equipment, Cueing for safety (lateral scoot; drop-arm BSC) Toilet Transfer Details (indicate cue type and reason): simulated to drop-arm recliner Toileting- Clothing Manipulation and Hygiene: Maximal assistance, Bed level General ADL Comments: Pt requiring cues for compensatory strategies and increased safety     Mobility   Overal bed mobility: Needs Assistance Bed Mobility: Supine to Sit, Sit to Supine, Rolling Rolling: Min assist Sidelying to sit: Mod assist, Used rails, HOB elevated Supine to sit: Min assist, HOB elevated, Used rails Sit to supine: Min assist, HOB elevated, Used rails     Transfers   Overall transfer level: Needs assistance Equipment used: None (with L LE prosthetic donned) Transfers: Bed to chair/wheelchair/BSC Sit to Stand: Max assist, +2 physical assistance, +2 safety/equipment Bed to/from chair/wheelchair/BSC transfer type:: Lateral/scoot transfer  Lateral/Scoot Transfers: From elevated surface, +2 safety/equipment, Min assist General transfer comment: min-mod +2 assist for lateral scoot bed<>BSC. no slide board available but would improve pt's ability to transfer.     Ambulation / Gait / Stairs / Clinical biochemist / Balance Dynamic Sitting Balance Sitting balance - Comments: Pt initially requiring CGA and UE support for balance  but quickly fading to Supervision sitting EOB with no UE support needed for static sitting Balance Overall balance assessment: Needs assistance Sitting-balance support: Single extremity supported, No upper  extremity supported, Feet unsupported Sitting balance-Leahy Scale: Fair Sitting balance - Comments: Pt initially requiring CGA and UE support for balance  but quickly fading to Supervision sitting EOB with no UE support needed for static sitting Standing balance support: Bilateral upper extremity supported, During functional activity, Reliant on assistive device for balance Standing balance-Leahy Scale: Poor Standing balance comment: with L LE prostetic donned     Special needs/care consideration Surgical wound VAC CIR 11/2017 then went home Hgb A1c 13.7      Previous Home Environment  Living Arrangements: Spouse/significant other  Lives With: Spouse Available Help at Discharge: Family, Available 24 hours/day Type of Home: House Home Layout: Two level, Able to live on main level with bedroom/bathroom, Bed/bath upstairs Alternate Level Stairs-Rails: Left Alternate Level Stairs-Number of Steps: 16 Home Access: Stairs to enter Entrance Stairs-Rails: Left, Right Entrance Stairs-Number of Steps: 1 Bathroom Shower/Tub: Engineer, manufacturing systems: Handicapped height Bathroom Accessibility: Yes How Accessible: Accessible via walker Home Care Services: No Additional Comments: family members live nearby and able to offer assistance   Discharge Living Setting Plans for Discharge Living Setting: Patient's home, Lives with (comment) (wife) Type of Home at Discharge: House Discharge Home Layout: Two level, Able to live on main level with bedroom/bathroom Alternate Level Stairs-Rails: Left Alternate Level Stairs-Number of Steps: 16 Discharge Home Access: Stairs to enter Entrance Stairs-Rails: Right, Left Entrance Stairs-Number of Steps: 1 Discharge Bathroom Shower/Tub: Tub/shower unit Discharge Bathroom Toilet: Handicapped height Discharge Bathroom  Accessibility: Yes How Accessible: Accessible via walker Does the patient have any problems obtaining your medications?: No    Social/Family/Support Systems Patient Roles: Spouse Contact Information: wife, [name] Anticipated Caregiver: wife Anticipated Caregiver's Contact Information: see contacts Ability/Limitations of Caregiver: none Caregiver Availability: 24/7 Discharge Plan Discussed with Primary Caregiver: Yes Is Caregiver In Agreement with Plan?: Yes Does Caregiver/Family have Issues with Lodging/Transportation while Pt is in Rehab?: No   Goals Patient/Family Goal for Rehab: mod I to supervision with PT and OT Expected length of stay: ELOS 7 days Additional Information: Has LLE prosthesis Pt/Family Agrees to Admission and willing to participate: Yes Program Orientation Provided & Reviewed with Pt/Caregiver Including Roles  & Responsibilities: Yes   Decrease burden of Care through IP rehab admission: n/a   Possible need for SNF placement upon discharge:not anticipated   Patient Condition: This patient's condition remains as documented in the consult dated 12/22/2023, in which the Rehabilitation Physician determined and documented that the patient's condition is appropriate for intensive rehabilitative care in an inpatient rehabilitation facility. Will admit to inpatient rehab today.   Preadmission Screen Completed By:  Tanya Fantasia, RN MSN 12/24/2023 5:58 PM, updates provided by Ashunti Schofield, PT 12/25/23 ______________________________________________________________________   Discussed status with Dr. Rayleen Cal on 12/25/23 at 9:00 and received approval for admission today.   Admission Coordinator:  Janel Medford MSN, updates provided by Breta Demedeiros, PT  time 13:00 Date 12/25/23

## 2023-12-26 DIAGNOSIS — K5900 Constipation, unspecified: Secondary | ICD-10-CM | POA: Diagnosis not present

## 2023-12-26 DIAGNOSIS — Z89511 Acquired absence of right leg below knee: Secondary | ICD-10-CM | POA: Diagnosis not present

## 2023-12-26 DIAGNOSIS — E1142 Type 2 diabetes mellitus with diabetic polyneuropathy: Secondary | ICD-10-CM

## 2023-12-26 DIAGNOSIS — D62 Acute posthemorrhagic anemia: Secondary | ICD-10-CM

## 2023-12-26 DIAGNOSIS — N182 Chronic kidney disease, stage 2 (mild): Secondary | ICD-10-CM | POA: Diagnosis not present

## 2023-12-26 LAB — CBC WITH DIFFERENTIAL/PLATELET
Abs Immature Granulocytes: 0.05 10*3/uL (ref 0.00–0.07)
Basophils Absolute: 0 10*3/uL (ref 0.0–0.1)
Basophils Relative: 0 %
Eosinophils Absolute: 0.1 10*3/uL (ref 0.0–0.5)
Eosinophils Relative: 2 %
HCT: 30.9 % — ABNORMAL LOW (ref 39.0–52.0)
Hemoglobin: 10 g/dL — ABNORMAL LOW (ref 13.0–17.0)
Immature Granulocytes: 1 %
Lymphocytes Relative: 22 %
Lymphs Abs: 1.2 10*3/uL (ref 0.7–4.0)
MCH: 30.4 pg (ref 26.0–34.0)
MCHC: 32.4 g/dL (ref 30.0–36.0)
MCV: 93.9 fL (ref 80.0–100.0)
Monocytes Absolute: 0.5 10*3/uL (ref 0.1–1.0)
Monocytes Relative: 8 %
Neutro Abs: 3.8 10*3/uL (ref 1.7–7.7)
Neutrophils Relative %: 67 %
Platelets: 326 10*3/uL (ref 150–400)
RBC: 3.29 MIL/uL — ABNORMAL LOW (ref 4.22–5.81)
RDW: 12.4 % (ref 11.5–15.5)
WBC: 5.7 10*3/uL (ref 4.0–10.5)
nRBC: 0 % (ref 0.0–0.2)

## 2023-12-26 LAB — COMPREHENSIVE METABOLIC PANEL WITH GFR
ALT: 15 U/L (ref 0–44)
AST: 21 U/L (ref 15–41)
Albumin: 2.2 g/dL — ABNORMAL LOW (ref 3.5–5.0)
Alkaline Phosphatase: 70 U/L (ref 38–126)
Anion gap: 8 (ref 5–15)
BUN: 10 mg/dL (ref 8–23)
CO2: 26 mmol/L (ref 22–32)
Calcium: 8.4 mg/dL — ABNORMAL LOW (ref 8.9–10.3)
Chloride: 101 mmol/L (ref 98–111)
Creatinine, Ser: 1.03 mg/dL (ref 0.61–1.24)
GFR, Estimated: >60 mL/min (ref >60)
Glucose, Bld: 92 mg/dL (ref 70–99)
Potassium: 4.3 mmol/L (ref 3.5–5.1)
Sodium: 135 mmol/L (ref 135–145)
Total Bilirubin: 0.7 mg/dL (ref 0.0–1.2)
Total Protein: 5.7 g/dL — ABNORMAL LOW (ref 6.5–8.1)

## 2023-12-26 LAB — GLUCOSE, CAPILLARY
Glucose-Capillary: 88 mg/dL (ref 70–99)
Glucose-Capillary: 103 mg/dL — ABNORMAL HIGH (ref 70–99)
Glucose-Capillary: 128 mg/dL — ABNORMAL HIGH (ref 70–99)
Glucose-Capillary: 139 mg/dL — ABNORMAL HIGH (ref 70–99)

## 2023-12-26 MED ORDER — INSULIN GLARGINE-YFGN 100 UNIT/ML ~~LOC~~ SOLN
22.0000 [IU] | Freq: Every day | SUBCUTANEOUS | Status: DC
  Administered 2023-12-26: 22 [IU] via SUBCUTANEOUS
  Filled 2023-12-26 (×2): qty 0.22

## 2023-12-26 MED ORDER — SORBITOL 70 % SOLN
60.0000 mL | Freq: Once | Status: AC
  Administered 2023-12-26: 60 mL via ORAL
  Filled 2023-12-26: qty 60

## 2023-12-26 NOTE — Evaluation (Signed)
 Physical Therapy Assessment and Plan  Patient Details  Name: Kevin Bradford MRN: [id number] Date of Birth: 1945-10-25  PT Diagnosis: Abnormal posture, Abnormality of gait, Coordination disorder, Difficulty walking, Edema, Muscle weakness, and Pain in R residual limb Rehab Potential: Good ELOS: 12-14 days   Today's Date: 12/26/2023 PT Individual Time: 0901-0956 PT Individual Time Calculation (min): 55 min    Hospital Problem: Principal Problem:   Right below-knee amputee [facility])   Past Medical History:  Past Medical History:  Diagnosis Date   Arthritis    "joint pain" (08/16/2017)   Below knee amputation (HCC)    Cellulitis and abscess of leg 10/18/2014   CKD (chronic kidney disease) stage 3, GFR 30-59 ml/min (HCC)    creatinine increases with antibiotics per pt, no known kidney disease per patient   Dehiscence of closure of skin, subsequent encounter    Diabetic foot ulcer (HCC)    left   Diabetic peripheral neuropathy (HCC)    GERD (gastroesophageal reflux disease)    12/26/2017- "not this year."    Osteomyelitis (HCC)    left transmetatarsal    Stroke (HCC)    Type II diabetes mellitus (HCC)    Past Surgical History:  Past Surgical History:  Procedure Laterality Date   AMPUTATION Left 07/19/2017   Procedure: LEFT TRANSMETATARSAL AMPUTATION;  Surgeon: Timothy Ford, MD;  Location: [facility] OR;  Service: Orthopedics;  Laterality: Left;   AMPUTATION Left 08/16/2017   REVISION LEFT TRANSMETATARSAL AMPUTATION   AMPUTATION Left 11/22/2017   Procedure: LEFT BELOW KNEE AMPUTATION;  Surgeon: Timothy Ford, MD;  Location: [facility] OR;  Service: Orthopedics;  Laterality: Left;   AMPUTATION Right 12/20/2023   Procedure: AMPUTATION RIGHT BELOW KNEE;  Surgeon: Timothy Ford, MD;  Location: [facility] OR;  Service: Orthopedics;  Laterality: Right;   CATARACT EXTRACTION W/ INTRAOCULAR LENS  IMPLANT, BILATERAL  ~ 2016   EYE SURGERY Bilateral    catarct   I & D EXTREMITY Left 10/17/2014   Procedure: IRRIGATION  AND DEBRIDEMENT EXTREMITY LEFT FOOT;  Surgeon: Timothy Ford, MD;  Location: [facility] OR;  Service: Orthopedics;  Laterality: Left;   IR ANGIO INTRA EXTRACRAN SEL COM CAROTID INNOMINATE BILAT MOD SED  05/07/2018   IR ANGIO VERTEBRAL SEL SUBCLAVIAN INNOMINATE BILAT MOD SED  05/07/2018   STUMP REVISION Left 08/16/2017   Procedure: REVISION LEFT TRANSMETATARSAL AMPUTATION;  Surgeon: Timothy Ford, MD;  Location: [facility] OR;  Service: Orthopedics;  Laterality: Left;   STUMP REVISION Left 12/27/2017   Procedure: LEFT BELOW KNEE AMPUTATION REVISION;  Surgeon: Timothy Ford, MD;  Location: [facility] OR;  Service: Orthopedics;  Laterality: Left;   TOE AMPUTATION Right 2013   5th toe    Assessment & Plan Clinical Impression: Patient is a 78 y.o. year old male with history significant for poorly controlled diabetes mellitus with peripheral neuropathy, history of CVA, CKD stage II, left BKA 12/27/2017 as well as requiring revision due to dehiscence of wound and received CIR 11/24/2017 - 11/30/2017. Per chart review patient lives with spouse. Two-level home bed and bath main level one-step to entry. Independent/active with left prosthesis independent ADLs and driving. Good family support. Presented 12/15/2023 with right foot infection/abscess with calcaneal osteomyelitis followed by orthopedic services and no change with conservative care requiring right BKA 12/20/2023 per Dr. Julio Ohm. Wound VAC as directed. Lovenox  for DVT prophylaxis. Patient remained on chronic Plavix  for history of CVA. Acute blood loss anemia 9.6 and monitored. Renal function remained stable with latest creatinine 1.07. Has  not had BM in few days. Reports pain is controlled. Therapy evaluations completed due to patient's decreased functional mobility was admitted for a comprehensive rehab program.   Patient currently requires mod with mobility secondary to muscle weakness and muscle joint tightness, decreased cardiorespiratoy endurance, impaired timing and sequencing  and decreased coordination, and decreased sitting balance, decreased standing balance, decreased postural control, decreased balance strategies, and difficulty maintaining precautions.  Prior to hospitalization, patient was modified independent  with mobility and lived with Spouse in a House home.  Home access is 1Stairs to enter.  Patient will benefit from skilled PT intervention to maximize safe functional mobility, minimize fall risk, and decrease caregiver burden for planned discharge home with 24 hour supervision.  Anticipate patient will benefit from follow up HH at discharge.  PT - End of Session Activity Tolerance: Tolerates 30+ min activity with multiple rests Endurance Deficit: Yes Endurance Deficit Description: required frequent breaks PT Assessment Rehab Potential (ACUTE/IP ONLY): Good PT Barriers to Discharge: Home environment access/layout;Wound Care;Weight bearing restrictions;Other (comments) PT Barriers to Discharge Comments: bilateral BKA with L prosthetic, pain, 1 STE, NWB RLE PT Patient demonstrates impairments in the following area(s): Balance;Behavior;Edema;Endurance;Motor;Nutrition;Pain;Perception;Safety;Sensory;Skin Integrity PT Transfers Functional Problem(s): Bed Mobility;Bed to Chair;Car;Furniture PT Locomotion Functional Problem(s): Ambulation;Wheelchair Mobility;Stairs PT Plan PT Intensity: Minimum of 1-2 x/day ,45 to 90 minutes PT Frequency: 5 out of 7 days PT Duration Estimated Length of Stay: 12-14 days PT Treatment/Interventions: Ambulation/gait training;Discharge planning;Functional mobility training;Psychosocial support;Therapeutic Activities;Visual/perceptual remediation/compensation;Balance/vestibular training;Disease management/prevention;Neuromuscular re-education;Skin care/wound management;Therapeutic Exercise;Wheelchair propulsion/positioning;DME/adaptive equipment instruction;Pain management;Splinting/orthotics;UE/LE Strength taining/ROM;Community  reintegration;Patient/family education;Stair training;UE/LE Coordination activities PT Transfers Anticipated Outcome(s): supervision with LRAD PT Locomotion Anticipated Outcome(s): CGA with LRAD PT Recommendation Follow Up Recommendations: Home health PT Patient destination: Home Equipment Recommended: To be determined   PT Evaluation Precautions/Restrictions Precautions Precautions: Fall Precaution/Restrictions Comments: wound vac to R LE Required Braces or Orthoses: Other Brace Other Brace: R LE limb protector, L LE prosthesis Restrictions Weight Bearing Restrictions Per Provider Order: Yes RLE Weight Bearing Per Provider Order: Non weight bearing Pain Interference Pain Interference Pain Effect on Sleep: 2. Occasionally Pain Interference with Therapy Activities: 1. Rarely or not at all Pain Interference with Day-to-Day Activities: 1. Rarely or not at all Home Living/Prior Functioning Home Living Living Arrangements: Spouse/significant other Available Help at Discharge: Family;Available 24 hours/day (wife predominantly but has other family that lives semi local) Type of Home: House Home Access: Stairs to enter [employer] of Steps: 1 Entrance Stairs-Rails: Right;Left;Can reach both Home Layout: Two level;Able to live on main level with bedroom/bathroom;Bed/bath upstairs;1/2 bath on main level Alternate Level Stairs-Number of Steps: 15-16 Alternate Level Stairs-Rails: Left Bathroom Shower/Tub: Tub/shower unit;Curtain Bathroom Toilet: Standard (1/2 bath toilet with push up rails) Bathroom Accessibility: Yes Additional Comments: Has TTB, w/c > 5 years, RW  Lives With: Spouse Prior Function Level of Independence: Independent with basic ADLs;Independent with gait;Independent with homemaking with ambulation;Independent with transfers (using L prosthesis)  Able to Take Stairs?: Yes Driving: Yes Vocation: Retired Optometrist - History Ability to See in  Adequate Light: 1 Impaired Vision - Assessment Additional Comments: Pt with difficulty reading fine print with glasses donned but denies bluriness or change. Reports need to go to eye doctor after hospital Perception Perception: Within Functional Limits Praxis Praxis: WFL  Cognition Overall Cognitive Status: Within Functional Limits for tasks assessed Arousal/Alertness: Awake/alert Memory: Appears intact Awareness: Impaired Problem Solving: Impaired Safety/Judgment: Appears intact Sensation Sensation Light Touch: Appears Intact Hot/Cold: Not tested Proprioception: Impaired by gross assessment Stereognosis:  Not tested Additional Comments: pt reports intermittent phantom pain in R residual limb, but isn't constant or debilitating. Coordination Gross Motor Movements are Fluid and Coordinated: No Fine Motor Movements are Fluid and Coordinated: No Coordination and Movement Description: altered balance strategies due to bilateral BKA Finger Nose Finger Test: slow and mild dysmetria on LUE Heel Shin Test: unable to perform due to bilateral BKA Motor  Motor Motor: Abnormal postural alignment and control Motor - Skilled Clinical Observations: altered balance strategies due to bilateral BKA  Trunk/Postural Assessment  Cervical Assessment Cervical Assessment: Exceptions to [facility] (forward head) Thoracic Assessment Thoracic Assessment: Exceptions to [facility] (rounded shoulders) Lumbar Assessment Lumbar Assessment: Exceptions to [facility] (posterior pelvic tilt) Postural Control Postural Control: Deficits on evaluation Trunk Control: posterior bias Righting Reactions: delayed due to bilateral BKA (new R BKA) Protective Responses: delayed due to bilateral BKA (new R BKA)  Balance Balance Balance Assessed: Yes Static Sitting Balance Static Sitting - Balance Support: Feet supported;Bilateral upper extremity supported Static Sitting - Level of Assistance: 5: Stand by assistance (CGA) Dynamic  Sitting Balance Dynamic Sitting - Balance Support: Feet supported;No upper extremity supported Dynamic Sitting - Level of Assistance: 4: Min assist Extremity Assessment  RLE Assessment RLE Assessment: Not tested General Strength Comments: grossly 3-/5 LLE Assessment LLE Assessment: Not tested General Strength Comments: grossly 3+/5  Care Tool Care Tool Bed Mobility Roll left and right activity   Roll left and right assist level: Moderate Assistance - Patient 50 - 74%    Sit to lying activity        Lying to sitting on side of bed activity   Lying to sitting on side of bed assist level: the ability to move from lying on the back to sitting on the side of the bed with no back support.: Moderate Assistance - Patient 50 - 74%     Care Tool Transfers Sit to stand transfer Sit to stand activity did not occur: Safety/medical concerns      Chair/bed transfer   Chair/bed transfer assist level: Minimal Assistance - Patient > 75%    Car transfer Car transfer activity did not occur: Safety/medical concerns (fear, weakness, decreased balance)        Care Tool Locomotion Ambulation Ambulation activity did not occur: Safety/medical concerns (fear, weakness, decreased balance)        Walk 10 feet activity Walk 10 feet activity did not occur: Safety/medical concerns (fear, weakness, decreased balance)       Walk 50 feet with 2 turns activity Walk 50 feet with 2 turns activity did not occur: Safety/medical concerns (fear, weakness, decreased balance)      Walk 150 feet activity Walk 150 feet activity did not occur: Safety/medical concerns (fear, weakness, decreased balance)      Walk 10 feet on uneven surfaces activity Walk 10 feet on uneven surfaces activity did not occur: Safety/medical concerns (fear, weakness, decreased balance)      Stairs Stair activity did not occur: Safety/medical concerns (fear, weakness, decreased balance)        Walk up/down 1 step activity Walk  up/down 1 step or curb (drop down) activity did not occur: Safety/medical concerns (fear, weakness, decreased balance)      Walk up/down 4 steps activity Walk up/down 4 steps activity did not occur: Safety/medical concerns (fear, weakness, decreased balance)      Walk up/down 12 steps activity Walk up/down 12 steps activity did not occur: Safety/medical concerns (fear, weakness, decreased balance)      Pick up  small objects from floor Pick up small object from the floor (from standing position) activity did not occur: Safety/medical concerns (fear, weakness, decreased balance)      Wheelchair Is the patient using a wheelchair?: Yes Type of Wheelchair: Manual Wheelchair activity did not occur: Safety/medical concerns (weakness)      Wheel 50 feet with 2 turns activity Wheelchair 50 feet with 2 turns activity did not occur: Safety/medical concerns (weakness)    Wheel 150 feet activity Wheelchair 150 feet activity did not occur: Safety/medical concerns (weakness)      Refer to Care Plan for Long Term Goals  SHORT TERM GOAL WEEK 1 PT Short Term Goal 1 (Week 1): pt will perform bed mobility with CGA overall PT Short Term Goal 2 (Week 1): pt will transfer bed<>chair with LRAD and CGA PT Short Term Goal 3 (Week 1): pt will transfer sit<>stand with LRAD and min A  Recommendations for other services: None   Skilled Therapeutic Intervention Evaluation completed (see details above and below) with education on PT POC and goals and individual treatment initiated with focus on functional mobility/transfers and generalized strengthening and endurance. Received pt semi-reclined in bed, pt educated on PT evaluation, CIR policies, and therapy schedule and agreeable. Pt reported pain 3/10 in R residual limb. Of note, pt very particular with mobility and fearful of falling - prefers to have plan and "goals" explained prior to initiating mobility.   Provided pt with 18x18 manual WC and R amputee  support pad. Upon returning, pt requesting more narrow WC for discharge home (reports his current WC is >17 years old). Explained recommendation to trial this size first to initiate OOB mobility and then go down in size if needed. Pt also fearful of falling, declining standing attempts and insisting on having 2nd person to assist with mobility - located rehab tech.  Pt transferred semi-reclined<>long sitting<>sitting L EOB with mod A for trunk control and increased time with heavy reliance on bedrails. Pt particular with mobility, insisiting on therapist putting up side bedrail and choosing not to follow cues for logroll technique. Donned L prosthetic with assist to stabilize leg only. Pt performed lateral scoot bed<>WC to L with min A via multiple small scoots and cues for technique. +2 present per pt request but did not physically assist. Concluded session with pt sitting in [facility] with all needs within reach and chair pad alarm in place. MD present for morning rounds and discussing pt's inability to have BM. Unable to locate Posey box - NT and RN notified.   Mobility Bed Mobility Bed Mobility: Rolling Right;Rolling Left;Supine to Sit Rolling Right: Moderate Assistance - Patient 50-74% Rolling Left: Moderate Assistance - Patient 50-74% Supine to Sit: Moderate Assistance - Patient 50-74% Transfers Transfers: Lateral/Scoot Transfers Lateral/Scoot Transfers: Minimal Assistance - Patient > 75% Locomotion  Gait Ambulation: No Gait Gait: No Stairs / Additional Locomotion Stairs: No Wheelchair Mobility Wheelchair Mobility: No   Discharge Criteria: Patient will be discharged from PT if patient refuses treatment 3 consecutive times without medical reason, if treatment goals not met, if there is a change in medical status, if patient makes no progress towards goals or if patient is discharged from hospital.  The above assessment, treatment plan, treatment alternatives and goals were discussed and  mutually agreed upon: by patient  Kevin Bradford PT, DPT 12/26/2023, 12:14 PM

## 2023-12-26 NOTE — Plan of Care (Signed)
  Problem: RH Balance Goal: LTG: Patient will maintain dynamic sitting balance (OT) Description: LTG:  Patient will maintain dynamic sitting balance with assistance during activities of daily living (OT) Flowsheets (Taken 12/26/2023 1229) LTG: Pt will maintain dynamic sitting balance during ADLs with: Independent with assistive device Goal: LTG Patient will maintain dynamic standing with ADLs (OT) Description: LTG:  Patient will maintain dynamic standing balance with assist during activities of daily living (OT)  Flowsheets (Taken 12/26/2023 1229) LTG: Pt will maintain dynamic standing balance during ADLs with: Contact Guard/Touching assist   Problem: Sit to Stand Goal: LTG:  Patient will perform sit to stand in prep for activites of daily living with assistance level (OT) Description: LTG:  Patient will perform sit to stand in prep for activites of daily living with assistance level (OT) Flowsheets (Taken 12/26/2023 1229) LTG: PT will perform sit to stand in prep for activites of daily living with assistance level: Contact Guard/Touching assist   Problem: RH Bathing Goal: LTG Patient will bathe all body parts with assist levels (OT) Description: LTG: Patient will bathe all body parts with assist levels (OT) Flowsheets (Taken 12/26/2023 1229) LTG: Pt will perform bathing with assistance level/cueing: Set up assist    Problem: RH Dressing Goal: LTG Patient will perform upper body dressing (OT) Description: LTG Patient will perform upper body dressing with assist, with/without cues (OT). Flowsheets (Taken 12/26/2023 1229) LTG: Pt will perform upper body dressing with assistance level of: Independent with assistive device Goal: LTG Patient will perform lower body dressing w/assist (OT) Description: LTG: Patient will perform lower body dressing with assist, with/without cues in positioning using equipment (OT) Flowsheets (Taken 12/26/2023 1229) LTG: Pt will perform lower body dressing with  assistance level of: Independent with assistive device   Problem: RH Toileting Goal: LTG Patient will perform toileting task (3/3 steps) with assistance level (OT) Description: LTG: Patient will perform toileting task (3/3 steps) with assistance level (OT)  Flowsheets (Taken 12/26/2023 1229) LTG: Pt will perform toileting task (3/3 steps) with assistance level: Supervision/Verbal cueing   Problem: RH Toilet Transfers Goal: LTG Patient will perform toilet transfers w/assist (OT) Description: LTG: Patient will perform toilet transfers with assist, with/without cues using equipment (OT) Flowsheets (Taken 12/26/2023 1229) LTG: Pt will perform toilet transfers with assistance level of: Supervision/Verbal cueing   Problem: RH Tub/Shower Transfers Goal: LTG Patient will perform tub/shower transfers w/assist (OT) Description: LTG: Patient will perform tub/shower transfers with assist, with/without cues using equipment (OT) Flowsheets (Taken 12/26/2023 1229) LTG: Pt will perform tub/shower stall transfers with assistance level of: Supervision/Verbal cueing

## 2023-12-26 NOTE — Progress Notes (Signed)
 Inpatient Rehabilitation Center Individual Statement of Services  Patient Name:  Kevin Bradford  Date:  12/26/2023  Welcome to the Inpatient Rehabilitation Center.  Our goal is to provide you with an individualized program based on your diagnosis and situation, designed to meet your specific needs.  With this comprehensive rehabilitation program, you will be expected to participate in at least 3 hours of rehabilitation therapies Monday-Friday, with modified therapy programming on the weekends.  Your rehabilitation program will include the following services:  Physical Therapy (PT), Occupational Therapy (OT), 24 hour per day rehabilitation nursing, Care Coordinator, Rehabilitation Medicine, Nutrition Services, and Pharmacy Services  Weekly team conferences will be held on Wednesday to discuss your progress.  Your Inpatient Rehabilitation Care Coordinator will talk with you frequently to get your input and to update you on team discussions.  Team conferences with you and your family in attendance may also be held.  Expected length of stay: 11-13 days  Overall anticipated outcome: CGA-supervision level  Depending on your progress and recovery, your program may change. Your Inpatient Rehabilitation Care Coordinator will coordinate services and will keep you informed of any changes. Your Inpatient Rehabilitation Care Coordinator's name and contact numbers are listed  below.  The following services may also be recommended but are not provided by the Inpatient Rehabilitation Center:  Driving Evaluations Home Health Rehabiltiation Services Outpatient Rehabilitation Services    Arrangements will be made to provide these services after discharge if needed.  Arrangements include referral to agencies that provide these services.  Your insurance has been verified to be:  [employer] Your primary doctor is:  Karran Husain  Pertinent information will be shared with your doctor and your insurance  company.  Inpatient Rehabilitation Care Coordinator:  Adrianna Albee, [place] [phone] or Justine Oms  Information discussed with and copy given to patient by: [name], 12/26/2023, 8:53 AM

## 2023-12-26 NOTE — Progress Notes (Signed)
 Patient ID: Kevin Bradford, male   DOB: 1946/01/13, 78 y.o.   MRN: [id number] Met with the patient to review current medical situation, rehab process, team conference and plan of care. Reviewed secondary risk management including DM (A1C 13.7); discussed CMM diet and medication: Levemir , short acting and Glucophage .  CVA on Plavix  and multiple vitamins. Reports tired, but sleeping ok, no other concerns noted. Continue to follow along to address educational needs to facilitate preparation for discharge.  Naoma Bacca

## 2023-12-26 NOTE — Plan of Care (Signed)
  Problem: RH SAFETY Goal: RH STG ADHERE TO SAFETY PRECAUTIONS W/ASSISTANCE/DEVICE Description: STG Adhere to Safety Precautions With  cues Assistance/Device. Outcome: Progressing   Problem: RH PAIN MANAGEMENT Goal: RH STG PAIN MANAGED AT OR BELOW PT'S PAIN GOAL Description: Pain < 4 with prns Outcome: Progressing   Problem: RH KNOWLEDGE DEFICIT LIMB LOSS Goal: RH STG INCREASE KNOWLEDGE OF SELF CARE AFTER LIMB LOSS Description: Patient and spouse will be able to manage care at discharge using educational resources for medication, skin care and dietary modification independently Outcome: Progressing   Problem: RH KNOWLEDGE DEFICIT Goal: RH STG INCREASE KNOWLEDGE OF DIABETES Description: Patient and spouse will be able to manage DM at discharge using educational resources for medication, skin care and dietary modification independently Outcome: Progressing

## 2023-12-26 NOTE — Progress Notes (Signed)
 PROGRESS NOTE   Subjective/Complaints: Patient working with therapy this morning.  He he has not had a bowel movement in several days per chart review.  Patient says he cannot recall when his last bowel movement was.  He does not want laxatives this morning and wants to try to have a bowel movement without medication.  ROS: Patient denies fever, new vision changes, dizziness, nausea, vomiting, diarrhea,  shortness of breath or chest pain, headache, or mood change. + constipation  Objective:   No results found. Recent Labs    12/25/23 0328 12/26/23 0449  WBC 5.2 5.7  HGB 9.6* 10.0*  HCT 30.5* 30.9*  PLT 351 326   Recent Labs    12/25/23 0328 12/26/23 0449  NA 135 135  K 4.4 4.3  CL 101 101  CO2 25 26  GLUCOSE 118* 92  BUN 9 10  CREATININE 1.07 1.03  CALCIUM  8.4* 8.4*    Intake/Output Summary (Last 24 hours) at 12/26/2023 0850 Last data filed at 12/26/2023 0800 Gross per 24 hour  Intake 240 ml  Output 1050 ml  Net -810 ml        Physical Exam: Vital Signs Blood pressure 112/71, pulse 72, temperature 98.3 F (36.8 C), resp. rate 16, height 4' 8.6" (1.438 m), weight 82.3 kg, SpO2 90%.  General: No apparent distress, sitting in WC HEENT: Head is normocephalic, atraumatic Neck: Supple without JVD or lymphadenopathy Heart: Reg rate and rhythm. No murmurs rubs or gallops Chest: CTA bilaterally without wheezes, rales, or rhonchi; no distress Abdomen: Soft, non-tender, mildly-distended, bowel sounds positive-hypoactive Extremities: B/L BKA, R BKA with limb protector and wound VAC- no drainage in canister Psych: Pt's affect is flat. Pt is cooperative Skin: Clean and intact without signs of breakdown Neuro:   Mental Status: AAOx3, memory intact Speech/Languate:  fluent, follows simple commands CRANIAL NERVES: 2-12 grossly intact     MOTOR: RUE: 5/5 Deltoid, 5/5 Biceps, 5/5 Triceps,5/5 Grip LUE: 5/5 Deltoid,  5/5 Biceps, 5/5 Triceps, 5/5 Grip RLE:  Declines to try to lift today due to recent surgery LLE: HF 5/5, KE 5/5   SENSORY: Normal to touch all 4 extremities    Assessment/Plan: 1. Functional deficits which require 3+ hours per day of interdisciplinary therapy in a comprehensive inpatient rehab setting. Physiatrist is providing close team supervision and 24 hour management of active medical problems listed below. Physiatrist and rehab team continue to assess barriers to discharge/monitor patient progress toward functional and medical goals  Care Tool:  Bathing              Bathing assist       Upper Body Dressing/Undressing Upper body dressing        Upper body assist      Lower Body Dressing/Undressing Lower body dressing            Lower body assist       Toileting Toileting    Toileting assist       Transfers Chair/bed transfer  Transfers assist           Locomotion Ambulation   Ambulation assist  Walk 10 feet activity   Assist           Walk 50 feet activity   Assist           Walk 150 feet activity   Assist           Walk 10 feet on uneven surface  activity   Assist           Wheelchair     Assist               Wheelchair 50 feet with 2 turns activity    Assist            Wheelchair 150 feet activity     Assist          Blood pressure 112/71, pulse 72, temperature 98.3 F (36.8 C), resp. rate 16, height 4' 8.6" (1.438 m), weight 82.3 kg, SpO2 90%.   Medical Problem List and Plan: 1. 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  Wound VAC as directed             -patient may not shower due to wound vac             -ELOS/Goals: 7 days, Mod I to Sup with PT/OT             -Continue CIR, team conference tomorrow 2.  Antithrombotics: -DVT/anticoagulation:  Pharmaceutical: Lovenox              -antiplatelet  therapy: Plavix  75 mg daily 3. Pain Management: Tramadol  50 mg every 6 hours 4. Mood/Behavior/Sleep: Melatonin 3 mg nightly.  Provide emotional support             -antipsychotic agents: N/A 5. Neuropsych/cognition: This patient is capable of making decisions on his own behalf. 6. Skin/Wound Care: Routine skin checks             -Wound vac in place, Dr. Julio Ohm recommend discontinue VAC at time of discharge from hospital 7. Fluids/Electrolytes/Nutrition: Routine in and outs with follow-up chemistries 8.  Acute blood loss anemia.  Follow-up CBC. HGB 9.6 on 6/9  -6/10 HGB stable 10.0 , monitor 9.  History of left BKA 2019.  Patient did receive CIR.  Patient does have a prosthesis.  Independent prior to admission with prosthesis 10.  Diabetes mellitus with peripheral neuropathy.  Hemoglobin A1c 12.4.  Currently on NovoLog  6 units 3 times daily, Semglee  25 units nightly.  Check blood sugars AC and at bedtime.... Prior to admission patient on Levemir  25 units daily as well as NovoLog  4 units 3 times daily with meals, Glucophage  1000 mg twice daily  CBG (last 3)  Recent Labs    12/25/23 2047 12/26/23 0601 12/26/23 1202  GLUCAP 168* 88 103*  6/10 will decrease Semglee  to 22 units to decrease risk of hypoglycemia  11.  CKD stage II.  Follow-up chemistries. Cr 1.03 on 6/10, stable continue to monitor 12.  History of CVA.  Continue Plavix  13. Constipation             -Pt declines additional laxative today, says he will consider if no BM tomorrow   - 6/10 patient declining bowel medications.  Discussed that we do not want constipation to continue to get worse. Agreed to medication this evening if no bowel movement by then, will order sorbitol 60ml if no bowel movement by then    LOS: 1 days A FACE TO FACE EVALUATION WAS PERFORMED  Lylia Sand 12/26/2023, 8:50  AM

## 2023-12-26 NOTE — Progress Notes (Signed)
 Inpatient Rehabilitation  Patient information reviewed and entered into eRehab system by [facility]. Karen Kays., CCC/SLP, PPS Coordinator.  Information including medical coding, functional ability and quality indicators will be reviewed and updated through discharge.

## 2023-12-26 NOTE — Progress Notes (Signed)
 Inpatient Rehabilitation Care Coordinator Assessment and Plan Patient Details  Name: Kevin Bradford MRN: [id number] Date of Birth: [birth year]  Today's Date: 12/26/2023  Hospital Problems: Principal Problem:   Right below-knee amputee [facility])  Past Medical History:  Past Medical History:  Diagnosis Date   Arthritis    "joint pain" (08/16/2017)   Below knee amputation (HCC)    Cellulitis and abscess of leg 10/18/2014   CKD (chronic kidney disease) stage 3, GFR 30-59 ml/min (HCC)    creatinine increases with antibiotics per pt, no known kidney disease per patient   Dehiscence of closure of skin, subsequent encounter    Diabetic foot ulcer (HCC)    left   Diabetic peripheral neuropathy (HCC)    GERD (gastroesophageal reflux disease)    12/26/2017- "not this year."    Osteomyelitis (HCC)    left transmetatarsal    Stroke (HCC)    Type II diabetes mellitus (HCC)    Past Surgical History:  Past Surgical History:  Procedure Laterality Date   AMPUTATION Left 07/19/2017   Procedure: LEFT TRANSMETATARSAL AMPUTATION;  Surgeon: Kevin Ford, MD;  Location: [facility] OR;  Service: Orthopedics;  Laterality: Left;   AMPUTATION Left 08/16/2017   REVISION LEFT TRANSMETATARSAL AMPUTATION   AMPUTATION Left 11/22/2017   Procedure: LEFT BELOW KNEE AMPUTATION;  Surgeon: Kevin Ford, MD;  Location: [facility] OR;  Service: Orthopedics;  Laterality: Left;   AMPUTATION Right 12/20/2023   Procedure: AMPUTATION RIGHT BELOW KNEE;  Surgeon: Kevin Ford, MD;  Location: [facility] OR;  Service: Orthopedics;  Laterality: Right;   CATARACT EXTRACTION W/ INTRAOCULAR LENS  IMPLANT, BILATERAL  ~ 2016   EYE SURGERY Bilateral    catarct   I & D EXTREMITY Left 10/17/2014   Procedure: IRRIGATION AND DEBRIDEMENT EXTREMITY LEFT FOOT;  Surgeon: Kevin Ford, MD;  Location: [facility] OR;  Service: Orthopedics;  Laterality: Left;   IR ANGIO INTRA EXTRACRAN SEL COM CAROTID INNOMINATE BILAT MOD SED  05/07/2018   IR ANGIO VERTEBRAL SEL SUBCLAVIAN  INNOMINATE BILAT MOD SED  05/07/2018   STUMP REVISION Left 08/16/2017   Procedure: REVISION LEFT TRANSMETATARSAL AMPUTATION;  Surgeon: Kevin Ford, MD;  Location: [facility] OR;  Service: Orthopedics;  Laterality: Left;   STUMP REVISION Left 12/27/2017   Procedure: LEFT BELOW KNEE AMPUTATION REVISION;  Surgeon: Kevin Ford, MD;  Location: [facility] OR;  Service: Orthopedics;  Laterality: Left;   TOE AMPUTATION Right 2013   5th toe   Social History:  reports that he has never smoked. He has never used smokeless tobacco. He reports current alcohol use of about 1.0 standard drink of alcohol per week. He reports that he does not use drugs.  Family / Support Systems Marital Status: Married Patient Roles: Spouse, Other (Comment) Spouse/Significant Other: Kevin Bradford [phone] Other Supports: Friends Anticipated Caregiver: Wife Ability/Limitations of Caregiver: No issues Caregiver Availability: 24/7 Family Dynamics: Close with family and friends, feel will get back to his independent level once he recovers fro this surgery. He was doing well prior to this, very active  Social History Preferred language: English Religion: Christian Cultural Background: NA Education: HS Health Literacy - How often do you need to have someone help you when you read instructions, pamphlets, or other written material from your doctor or pharmacy?: Never Writes: Yes Employment Status: Retired Marine scientist Issues: NA Guardian/Conservator: None-according to MD pt is capable of making his own decisions while here   Abuse/Neglect Abuse/Neglect Assessment Can Be Completed: Yes Physical Abuse: Denies Verbal Abuse: Denies  Sexual Abuse: Denies Exploitation of patient/patient's resources: Denies Self-Neglect: Denies  Patient response to: Social Isolation - How often do you feel lonely or isolated from those around you?: Never  Emotional Status Pt's affect, behavior and adjustment status: Pt is motivated to  return to his acitve lifestyle once he is healed from this surgery. He is confident he will do well and get back to his independent level Recent Psychosocial Issues: other health issues Psychiatric History: No history seems to be coping appropriately and taking each day as it comes. Substance Abuse History: NA  Patient / Family Perceptions, Expectations & Goals Pt/Family understanding of illness & functional limitations: Pt is able to explain his amputation and has gone through this before and feels knows what is in store for him. He is confident he will do well and recover and be independent Premorbid pt/family roles/activities: husband, retiree, friend, etc Anticipated changes in roles/activities/participation: resume Pt/family expectations/goals: Pt states: " I plan to do well and get moving I'm tired of being in a bed."  [employer]: Other (Comment) Premorbid Home Care/DME Agencies: Other (Comment) (in past with other amputation 2019) Transportation available at discharge: self and wife Is the patient able to respond to transportation needs?: Yes In the past 12 months, has lack of transportation kept you from medical appointments or from getting medications?: No In the past 12 months, has lack of transportation kept you from meetings, work, or from getting things needed for daily living?: No  Discharge Planning Living Arrangements: Spouse/significant other Support Systems: Spouse/significant other, Other relatives, Friends/neighbors Type of Residence: Private residence Insurance Resources: Media planner (specify) Engineer, materials) Financial Resources: Social Security, Family Support Financial Screen Referred: No Living Expenses: Own Money Management: Patient, Spouse Does the patient have any problems obtaining your medications?: No Home Management: wife Patient/Family Preliminary Plans: Return home with wife who is in good health who can assist if  needed. He feels he will be mod/i and not need assist and has been through this before. Aware being evaluated in therapies today and goals being set for stay here Care Coordinator Anticipated Follow Up Needs: HH/OP  Clinical Impression Pleasant gentleman who is motivated to return to his active lifestyle and confident he will. His wife is supportive and can assist. Await therapy evaluations.  [name] 12/26/2023, 8:52 AM

## 2023-12-26 NOTE — Progress Notes (Signed)
 Physical Therapy Session Note  Patient Details  Name: Kevin Bradford MRN: [id number] Date of Birth: Aug 09, 1945  Today's Date: 12/26/2023 PT Individual Time: 4098-1191 PT Individual Time Calculation (min): 62 min Today's Date: 12/26/2023 PT Missed Time: 13 Minutes Missed Time Reason: Patient fatigue  Short Term Goals: Week 1:  PT Short Term Goal 1 (Week 1): pt will perform bed mobility with CGA overall PT Short Term Goal 2 (Week 1): pt will transfer bed<>chair with LRAD and CGA PT Short Term Goal 3 (Week 1): pt will transfer sit<>stand with LRAD and min A  Skilled Therapeutic Interventions/Progress Updates:   Received pt semi-reclined in bed, pt agreeable to PT treatment, and reported pain 3/10 in R residual limb (premedicated). Session with emphasis on functional mobility/transfers, toileting, and generalized strengthening and endurance. Pt transferred semi-reclined<>sitting L EOB with HOB elevated and use of bedrails with supervision. Donned gel liner, sock, and prosthetic sitting EOB with supervision. With encouragement, pt agreed to attempt standing - attempted to stand from elevated EOB with RW x 3 trials, but despite max/total A pt unable to clear buttocks and lacking anterior weight shift; anticipate fear also limiting pt's ability with standing. Pt requested to try transferring onto commode to see if "urge to go" comes.    Pt transferred bed<>WC via lateral scoot to R with mod A with multimodal cues for sequencing and anterior weight shifting, although pt with poor follow through of cues. Performed lateral scoot onto bedside commode with min A using grab bar and doffed underwear via lateral leans. Pt requesting stool softener - RN notified and present to administer sorbitol. Pt requested a few minutes on commode despite education that medication may not take effect immediately. Pt ultimately unsuccessful on commode and required max A to pull underwear over hips. Pt required mod A for lateral  scoot transfer from commode<>WC. Pt demonstrating decreased insight, stating "well should I stand?" Despite being unable to stand a few minutes prior.   Pt requested to return to bed due to fatigue and lightheadedness. Transferred WC<>bed to L via lateral scoot with mod A, pt looking for therapist to assist stating "you're going to have to move me". Removed prosthetic and transferred into supine with supervision. Pt then c/o having "cold sweats". Scooted to [facility] pulling on headboard with supervision and provided pt with water. Provided pt with grn TB and demonstrated bed level UE exercises to work on in between therapies. Concluded session with pt semi-reclined in bed, needs within reach, and bed alarm on. 13 minutes missed of skilled physical therapy due to fatigue.   Therapy Documentation Precautions:  Restrictions Weight Bearing Restrictions Per Provider Order: Yes RLE Weight Bearing Per Provider Order: Non weight bearing  Therapy/Group: Individual Therapy [name] [name] PT, DPT 12/26/2023, 7:50 AM

## 2023-12-26 NOTE — Evaluation (Signed)
 Occupational Therapy Assessment and Plan  Patient Details  Name: Kevin Bradford MRN: [id number] Date of Birth: February 27, 1946  OT Diagnosis: abnormal posture, acute pain, muscle weakness (generalized), and swelling of limb Rehab Potential: Rehab Potential (ACUTE ONLY): Fair ELOS: 10-12 days   Today's Date: 12/26/2023 OT Individual Time: 1105-1200 OT Individual Time Calculation (min): 55 min     Hospital Problem: Principal Problem:   Right below-knee amputee [facility])   Past Medical History:  Past Medical History:  Diagnosis Date   Arthritis    "joint pain" (08/16/2017)   Below knee amputation (HCC)    Cellulitis and abscess of leg 10/18/2014   CKD (chronic kidney disease) stage 3, GFR 30-59 ml/min (HCC)    creatinine increases with antibiotics per pt, no known kidney disease per patient   Dehiscence of closure of skin, subsequent encounter    Diabetic foot ulcer (HCC)    left   Diabetic peripheral neuropathy (HCC)    GERD (gastroesophageal reflux disease)    12/26/2017- "not this year."    Osteomyelitis (HCC)    left transmetatarsal    Stroke (HCC)    Type II diabetes mellitus (HCC)    Past Surgical History:  Past Surgical History:  Procedure Laterality Date   AMPUTATION Left 07/19/2017   Procedure: LEFT TRANSMETATARSAL AMPUTATION;  Surgeon: Timothy Ford, MD;  Location: [facility] OR;  Service: Orthopedics;  Laterality: Left;   AMPUTATION Left 08/16/2017   REVISION LEFT TRANSMETATARSAL AMPUTATION   AMPUTATION Left 11/22/2017   Procedure: LEFT BELOW KNEE AMPUTATION;  Surgeon: Timothy Ford, MD;  Location: [facility] OR;  Service: Orthopedics;  Laterality: Left;   AMPUTATION Right 12/20/2023   Procedure: AMPUTATION RIGHT BELOW KNEE;  Surgeon: Timothy Ford, MD;  Location: [facility] OR;  Service: Orthopedics;  Laterality: Right;   CATARACT EXTRACTION W/ INTRAOCULAR LENS  IMPLANT, BILATERAL  ~ 2016   EYE SURGERY Bilateral    catarct   I & D EXTREMITY Left 10/17/2014   Procedure: IRRIGATION AND DEBRIDEMENT  EXTREMITY LEFT FOOT;  Surgeon: Timothy Ford, MD;  Location: [facility] OR;  Service: Orthopedics;  Laterality: Left;   IR ANGIO INTRA EXTRACRAN SEL COM CAROTID INNOMINATE BILAT MOD SED  05/07/2018   IR ANGIO VERTEBRAL SEL SUBCLAVIAN INNOMINATE BILAT MOD SED  05/07/2018   STUMP REVISION Left 08/16/2017   Procedure: REVISION LEFT TRANSMETATARSAL AMPUTATION;  Surgeon: Timothy Ford, MD;  Location: [facility] OR;  Service: Orthopedics;  Laterality: Left;   STUMP REVISION Left 12/27/2017   Procedure: LEFT BELOW KNEE AMPUTATION REVISION;  Surgeon: Timothy Ford, MD;  Location: [facility] OR;  Service: Orthopedics;  Laterality: Left;   TOE AMPUTATION Right 2013   5th toe    Assessment & Plan Clinical Impression:  Kevin Bradford is a 78 year old right handed male with history significant for poorly controlled diabetes mellitus with peripheral neuropathy, history of CVA, CKD stage II, left BKA 12/27/2017 as well as requiring revision due to dehiscence of wound and received CIR 11/24/2017 - 11/30/2017. Per chart review patient lives with spouse. Two-level home bed and bath main level one-step to entry. Independent/active with left prosthesis independent ADLs and driving. Good family support. Presented 12/15/2023 with right foot infection/abscess with calcaneal osteomyelitis followed by orthopedic services and no change with conservative care requiring right BKA 12/20/2023 per Dr. Julio Ohm. Wound VAC as directed. Lovenox  for DVT prophylaxis. Patient remained on chronic Plavix  for history of CVA. Acute blood loss anemia 9.6 and monitored. Renal function remained stable with latest creatinine 1.07. Has not  had BM in few days. Reports pain is controlled. Therapy evaluations completed due to patient's decreased functional mobility was admitted for a comprehensive rehab program. Patient transferred to CIR on 12/25/2023 .    Patient currently requires min with basic self-care skills secondary to muscle weakness, decreased cardiorespiratoy endurance,  decreased coordination, decreased awareness and decreased problem solving, and decreased sitting balance and decreased standing balance.  Prior to hospitalization, patient could complete self-care with mod I.  Patient will benefit from skilled intervention to decrease level of assist with basic self-care skills and increase independence with basic self-care skills prior to discharge home with care partner.  Anticipate patient will require 24 hour supervision and follow up home health.  OT - End of Session Activity Tolerance: Tolerates < 10 min activity, no significant change in vital signs Endurance Deficit: Yes Endurance Deficit Description: required frequent breaks OT Assessment Rehab Potential (ACUTE ONLY): Fair OT Barriers to Discharge: Home environment access/layout;Inaccessible home environment;Weight bearing restrictions;Wound Care OT Patient demonstrates impairments in the following area(s): Balance;Cognition;Edema;Endurance;Motor;Pain OT Basic ADL's Functional Problem(s): Bathing;Dressing;Toileting OT Advanced ADL's Functional Problem(s): Simple Meal Preparation OT Transfers Functional Problem(s): Toilet;Tub/Shower OT Additional Impairment(s): None OT Plan OT Intensity: Minimum of 1-2 x/day, 45 to 90 minutes OT Frequency: 5 out of 7 days OT Duration/Estimated Length of Stay: 10-12 days OT Treatment/Interventions: Balance/vestibular training;Discharge planning;Pain management;Self Care/advanced ADL retraining;Therapeutic Activities;UE/LE Coordination activities;Cognitive remediation/compensation;Disease mangement/prevention;Functional mobility training;Patient/family education;Skin care/wound managment;Therapeutic Exercise;DME/adaptive equipment instruction;Neuromuscular re-education;UE/LE Strength taining/ROM;Wheelchair propulsion/positioning OT Self Feeding Anticipated Outcome(s): Independent OT Basic Self-Care Anticipated Outcome(s): Mod I / supervision OT Toileting Anticipated  Outcome(s): Supervision OT Bathroom Transfers Anticipated Outcome(s): Supervision OT Recommendation Recommendations for Other Services: Therapeutic Recreation consult Therapeutic Recreation Interventions: Pet therapy Patient destination: Home Follow Up Recommendations: Home health OT;24 hour supervision/assistance Equipment Recommended: To be determined   OT Evaluation Precautions/Restrictions  Restrictions RLE Weight Bearing Per Provider Order: Non weight bearing Home Living/Prior Functioning Home Living Family/patient expects to be discharged to:: Private residence Living Arrangements: Spouse/significant other Available Help at Discharge: Family, Available 24 hours/day (wife predominantly but has other family that lives semi local) Type of Home: House Home Access: Stairs to enter Secretary/administrator of Steps: 1 Entrance Stairs-Rails: Right, Left, Can reach both Home Layout: Two level, Able to live on main level with bedroom/bathroom, Bed/bath upstairs, 1/2 bath on main level Alternate Level Stairs-Number of Steps: 15-16 Alternate Level Stairs-Rails: Left Bathroom Shower/Tub: Tub/shower unit, Engineer, building services: Standard (1/2 bath toilet with push up rails) Bathroom Accessibility: Yes Additional Comments: Has TTB, w/c > 5 years, RW  Lives With: Spouse IADL History Homemaking Responsibilities: Yes Meal Prep Responsibility: Secondary Occupation: Part time employment 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 Leisure and Hobbies: "problem solving" Prior Function Level of Independence: Independent with basic ADLs, Independent with gait, Independent with homemaking with ambulation, Independent with transfers (using L prosthesis)  Able to Take Stairs?: Yes Driving: Yes Vocation: Retired Administrator, sports Baseline Vision/History: 1 Wears glasses (readers) Ability to See in Adequate Light: 1 Impaired Patient Visual Report: No change from baseline Vision Assessment?: No  apparent visual deficits Additional Comments: Pt with difficulty reading fine print with glasses donned but denies bluriness or change. Reports need to go to eye doctor after hospital Perception  Perception: Within Functional Limits Praxis Praxis: [facility] Cognition Cognition Overall Cognitive Status: Within Functional Limits for tasks assessed Arousal/Alertness: Awake/alert Orientation Level: Person;Place;Situation Person: Oriented Place: Oriented Situation: Oriented Memory: Appears intact Awareness: Appears intact Problem Solving: Impaired Safety/Judgment: Appears intact Brief Interview for Mental  Status (BIMS) Repetition of Three Words (First Attempt): 3 Temporal Orientation: Year: Correct Temporal Orientation: Month: Accurate within 5 days Temporal Orientation: Day: Correct Recall: "Sock": Yes, no cue required Recall: "Blue": Yes, no cue required Recall: "Bed": No, could not recall BIMS Summary Score: 13 Sensation Sensation Light Touch: Appears Intact Hot/Cold: Not tested Proprioception: Impaired by gross assessment Stereognosis: Not tested Additional Comments: pt reports intermittent phantom pain in R residual limb, but isn't constant or debilitating. Coordination Gross Motor Movements are Fluid and Coordinated: No Fine Motor Movements are Fluid and Coordinated: No Coordination and Movement Description: altered balance strategies due to bilateral BKA Finger Nose Finger Test: slow and mild dysmetria on LUE Heel Shin Test: unable to perform due to bilateral BKA Motor  Motor Motor: Abnormal postural alignment and control Motor - Skilled Clinical Observations: altered balance strategies due to bilateral BKA  Trunk/Postural Assessment  Cervical Assessment Cervical Assessment: Exceptions to [facility] (forward head) Thoracic Assessment Thoracic Assessment: Exceptions to [facility] (rounded shoulders) Lumbar Assessment Lumbar Assessment: Exceptions to [facility] (posterior pelvic  tilt) Postural Control Postural Control: Deficits on evaluation Trunk Control: posterior bias Righting Reactions: delayed due to bilateral BKA (new R BKA) Protective Responses: delayed due to bilateral BKA (new R BKA)  Balance Balance Balance Assessed: Yes Static Sitting Balance Static Sitting - Balance Support: Feet supported;Bilateral upper extremity supported Static Sitting - Level of Assistance: 5: Stand by assistance (CGA) Dynamic Sitting Balance Dynamic Sitting - Balance Support: Feet supported;No upper extremity supported Dynamic Sitting - Level of Assistance: 4: Min assist Extremity/Trunk Assessment RUE Assessment RUE Assessment: Within Functional Limits LUE Assessment LUE Assessment: Within Functional Limits  Care Tool Care Tool Self Care Eating   Eating Assist Level: Set up assist    Oral Care    Oral Care Assist Level: Set up assist    Bathing   Body parts bathed by patient: Right arm;Left arm;Chest;Abdomen;Front perineal area;Right upper leg;Left upper leg;Face Body parts bathed by helper: Buttocks Body parts n/a: Right lower leg;Left lower leg Assist Level: Minimal Assistance - Patient > 75%    Upper Body Dressing(including orthotics)   What is the patient wearing?: Pull over shirt   Assist Level: Set up assist    Lower Body Dressing (excluding footwear)   What is the patient wearing?: Pants Assist for lower body dressing: Minimal Assistance - Patient > 75%    Putting on/Taking off footwear   What is the patient wearing?: Orthosis (L prosthesis) Assist for footwear: Supervision/Verbal cueing       Care Tool Toileting Toileting activity   Assist for toileting: Moderate Assistance - Patient 50 - 74%     Care Tool Bed Mobility Roll left and right activity   Roll left and right assist level: Moderate Assistance - Patient 50 - 74%    Sit to lying activity        Lying to sitting on side of bed activity   Lying to sitting on side of bed assist  level: the ability to move from lying on the back to sitting on the side of the bed with no back support.: Moderate Assistance - Patient 50 - 74%     Care Tool Transfers Sit to stand transfer Sit to stand activity did not occur: Safety/medical concerns      Chair/bed transfer   Chair/bed transfer assist level: Minimal Assistance - Patient > 75%     Toilet transfer Toilet transfer activity did not occur: Refused       Care Tool Cognition  Expression of Ideas and Wants Expression of Ideas and Wants: 4. Without difficulty (complex and basic) - expresses complex messages without difficulty and with speech that is clear and easy to understand  Understanding Verbal and Non-Verbal Content Understanding Verbal and Non-Verbal Content: 4. Understands (complex and basic) - clear comprehension without cues or repetitions   Memory/Recall Ability     Refer to Care Plan for Long Term Goals  SHORT TERM GOAL WEEK 1 OT Short Term Goal 1 (Week 1): Pt will complete toilet transfer with min A using LRAD OT Short Term Goal 2 (Week 1): Pt will complete 2/3 toileting steps with CGA for balance OT Short Term Goal 3 (Week 1): Pt will complete sit > stand in prep for ADL with max A  Recommendations for other services: Therapeutic Recreation  Pet therapy   Skilled Therapeutic Intervention Patient received upright in bed upon therapy arrival and agreeable to participate in OT evaluation. Education provided on OT purpose, therapy schedule, goals for therapy, and safety policy while in rehab. 2/10 pain reported in R residual limb; pre-medicated. OT offered rest breaks, repositioning for pain reduction.  Patient demonstrates dynamic sitting/standing balance, gross motor coordination, functional endurance and strength deficits resulting in difficulty completing BADL tasks without increased physical assist.   Pt completed semi upright <> EOB with supervision with heavy reliance on bed rails. Able to wash UB set up  A, LB with min A, with pt reporting he normally lays in bed to donn however did not adequately pull over buttocks. Pt slightly defensive and condescending with requests for him to complete ADLs. Pt reports "I did all of this myself and I don't need help now." Set up A for oral care seated EOB. Pt declined transfer, stating "I don't have plans of getting OOB anymore" but did agree to trial stand. Set up A for donning LLE prosthesis. From elevated bed using RW, pt had difficult coordinating hand placement for sit > stand. Attempted X 2 to clear buttocks however unable to do so despite max A. Pt verbalized fear with RW taking off out from under him as it did at home with a fall he had. Anticipate pt was fearful and slightly self-limiting with standing trial.  Cues needed throughout for sequencing, thoroughness and body positioning for safety. Pt will benefit from skilled OT services to focus on mentioned deficits. See below for further ADL and functional transfer performance. Pt remained seated EOB with NT present at direct care handoff as pt desired to take the clothes off that were just put on.   ADL ADL Eating: Set up Where Assessed-Eating: Bed level Grooming: Setup Where Assessed-Grooming: Edge of bed Upper Body Bathing: Setup Where Assessed-Upper Body Bathing: Edge of bed Lower Body Bathing: Minimal assistance Where Assessed-Lower Body Bathing: Edge of bed;Bed level Upper Body Dressing: Setup Where Assessed-Upper Body Dressing: Edge of bed Lower Body Dressing: Minimal assistance Where Assessed-Lower Body Dressing: Edge of bed;Bed level Toileting: Moderate assistance Where Assessed-Toileting: Bed level Toilet Transfer: Unable to assess (refused) Tub/Shower Transfer: Unable to assess Tub/Shower Transfer Method: Unable to assess Film/video editor: Unable to assess Film/video editor Method: Unable to assess Mobility  Bed Mobility Bed Mobility: Rolling Right;Rolling Left;Supine  to Sit Rolling Right: Moderate Assistance - Patient 50-74% Rolling Left: Moderate Assistance - Patient 50-74% Supine to Sit: Moderate Assistance - Patient 50-74%   Discharge Criteria: Patient will be discharged from OT if patient refuses treatment 3 consecutive times without medical reason, if treatment goals not met,  if there is a change in medical status, if patient makes no progress towards goals or if patient is discharged from hospital.  The above assessment, treatment plan, treatment alternatives and goals were discussed and mutually agreed upon: by patient  Ruthanna Covert, MS, OTR/L  12/26/2023, 12:26 PM

## 2023-12-27 DIAGNOSIS — K5900 Constipation, unspecified: Secondary | ICD-10-CM | POA: Diagnosis not present

## 2023-12-27 DIAGNOSIS — E1142 Type 2 diabetes mellitus with diabetic polyneuropathy: Secondary | ICD-10-CM | POA: Diagnosis not present

## 2023-12-27 DIAGNOSIS — N182 Chronic kidney disease, stage 2 (mild): Secondary | ICD-10-CM | POA: Diagnosis not present

## 2023-12-27 DIAGNOSIS — Z89511 Acquired absence of right leg below knee: Secondary | ICD-10-CM | POA: Diagnosis not present

## 2023-12-27 LAB — GLUCOSE, CAPILLARY
Glucose-Capillary: 85 mg/dL (ref 70–99)
Glucose-Capillary: 132 mg/dL — ABNORMAL HIGH (ref 70–99)
Glucose-Capillary: 111 mg/dL — ABNORMAL HIGH (ref 70–99)
Glucose-Capillary: 123 mg/dL — ABNORMAL HIGH (ref 70–99)

## 2023-12-27 MED ORDER — POLYETHYLENE GLYCOL 3350 17 GM/SCOOP PO POWD
119.0000 g | Freq: Once | ORAL | Status: AC
  Administered 2023-12-27: 119 g via ORAL
  Filled 2023-12-27: qty 119

## 2023-12-27 MED ORDER — INSULIN GLARGINE-YFGN 100 UNIT/ML ~~LOC~~ SOLN
20.0000 [IU] | Freq: Every day | SUBCUTANEOUS | Status: DC
  Administered 2023-12-27 – 2023-12-28 (×2): 20 [IU] via SUBCUTANEOUS
  Filled 2023-12-27 (×3): qty 0.2

## 2023-12-27 NOTE — Progress Notes (Signed)
 Patient ID: Kevin Bradford, male   DOB: [birth year], 78 y.o.   MRN: [id number] Met with pt and wife who is present in his room to gibe both team conference update with goals of supervision-mod/I wheelchair level and discharge date of 6/23. Pt felt this was long informed if progressed more quickly can move up date. He will talk with team about this. Discussed wheelchair and follow up will get back with worker regarding home health he had 6 years ago. He would like them again.

## 2023-12-27 NOTE — Progress Notes (Signed)
 PROGRESS NOTE   Subjective/Complaints: Has not had BM yet, had sorbitol yesterday.   ROS: Patient denies fever, new vision changes, dizziness, nausea, vomiting, diarrhea,  abdominal pain, shortness of breath or chest pain, headache, or mood change. + constipation- continued  Objective:   No results found. Recent Labs    12/25/23 0328 12/26/23 0449  WBC 5.2 5.7  HGB 9.6* 10.0*  HCT 30.5* 30.9*  PLT 351 326   Recent Labs    12/25/23 0328 12/26/23 0449  NA 135 135  K 4.4 4.3  CL 101 101  CO2 25 26  GLUCOSE 118* 92  BUN 9 10  CREATININE 1.07 1.03  CALCIUM  8.4* 8.4*    Intake/Output Summary (Last 24 hours) at 12/27/2023 1146 Last data filed at 12/27/2023 0900 Gross per 24 hour  Intake 720 ml  Output 1225 ml  Net -505 ml        Physical Exam: Vital Signs Blood pressure 122/60, pulse 69, temperature 98.5 F (36.9 C), temperature source Oral, resp. rate 17, height 4' 8.6 (1.438 m), weight 82.3 kg, SpO2 96%.  General: No apparent distress, sitting in WC working with therapy HEENT: Head is normocephalic, atraumatic Neck: Supple without JVD or lymphadenopathy Heart: Reg rate and rhythm. No murmurs rubs or gallops Chest: CTA bilaterally without wheezes, rales, or rhonchi; no distress Abdomen: Soft, non-tender, mildly-distended, bowel sounds positive-hypoactive Extremities: B/L BKA, R BKA with limb protector and wound VAC- no drainage in canister Psych: Pt's affect is flat. Pt is cooperative Skin: Clean and intact without signs of breakdown Neuro:   Mental Status: AAOx3, memory intact Speech/Languate:  fluent, follows simple commands CRANIAL NERVES: 2-12 grossly intact     MOTOR: RUE: 5/5 Deltoid, 5/5 Biceps, 5/5 Triceps,5/5 Grip LUE: 5/5 Deltoid, 5/5 Biceps, 5/5 Triceps, 5/5 Grip RLE:  Declines to try to lift today due to recent surgery LLE: HF 5/5, KE 5/5   SENSORY: Normal to touch all 4 extremities     Assessment/Plan: 1. Functional deficits which require 3+ hours per day of interdisciplinary therapy in a comprehensive inpatient rehab setting. Physiatrist is providing close team supervision and 24 hour management of active medical problems listed below. Physiatrist and rehab team continue to assess barriers to discharge/monitor patient progress toward functional and medical goals  Care Tool:  Bathing    Body parts bathed by patient: Right arm, Left arm, Chest, Abdomen, Front perineal area, Right upper leg, Left upper leg, Face   Body parts bathed by helper: Buttocks Body parts n/a: Right lower leg, Left lower leg   Bathing assist Assist Level: Minimal Assistance - Patient > 75%     Upper Body Dressing/Undressing Upper body dressing   What is the patient wearing?: Pull over shirt    Upper body assist Assist Level: Set up assist    Lower Body Dressing/Undressing Lower body dressing      What is the patient wearing?: Pants     Lower body assist Assist for lower body dressing: Minimal Assistance - Patient > 75%     Toileting Toileting    Toileting assist Assist for toileting: Moderate Assistance - Patient 50 - 74%     Transfers Chair/bed transfer  Transfers assist  Chair/bed transfer activity did not occur: Safety/medical concerns  Chair/bed transfer assist level: Minimal Assistance - Patient > 75%     Locomotion Ambulation   Ambulation assist   Ambulation activity did not occur: Safety/medical concerns (fear, weakness, decreased balance)          Walk 10 feet activity   Assist  Walk 10 feet activity did not occur: Safety/medical concerns (fear, weakness, decreased balance)        Walk 50 feet activity   Assist Walk 50 feet with 2 turns activity did not occur: Safety/medical concerns (fear, weakness, decreased balance)         Walk 150 feet activity   Assist Walk 150 feet activity did not occur: Safety/medical concerns (fear,  weakness, decreased balance)         Walk 10 feet on uneven surface  activity   Assist Walk 10 feet on uneven surfaces activity did not occur: Safety/medical concerns (fear, weakness, decreased balance)         Wheelchair     Assist Is the patient using a wheelchair?: Yes Type of Wheelchair: Manual Wheelchair activity did not occur: Safety/medical concerns (weakness)         Wheelchair 50 feet with 2 turns activity    Assist    Wheelchair 50 feet with 2 turns activity did not occur: Safety/medical concerns (weakness)       Wheelchair 150 feet activity     Assist  Wheelchair 150 feet activity did not occur: Safety/medical concerns (weakness)       Blood pressure 122/60, pulse 69, temperature 98.5 F (36.9 C), temperature source Oral, resp. rate 17, height 4' 8.6 (1.438 m), weight 82.3 kg, SpO2 96%.   Medical Problem List and Plan: 1. 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  Wound VAC as directed             -patient may not shower due to wound vac             -ELOS/Goals: 7 days, Mod I to Sup with PT/OT             -Continue CIR  -Team conference today please see physician documentation under team conference tab, met with team  to discuss problems,progress, and goals. Formulized individual treatment plan based on medical history, underlying problem and comorbidities.   2.  Antithrombotics: -DVT/anticoagulation:  Pharmaceutical: Lovenox              -antiplatelet therapy: Plavix  75 mg daily 3. Pain Management: Tramadol  50 mg every 6 hours 4. Mood/Behavior/Sleep: Melatonin 3 mg nightly.  Provide emotional support             -antipsychotic agents: N/A 5. Neuropsych/cognition: This patient is capable of making decisions on his own behalf. 6. Skin/Wound Care: Routine skin checks             -Wound vac in place, Dr. Julio Ohm recommend discontinue VAC at time of discharge from hospital  -Continue wound  vac 7. Fluids/Electrolytes/Nutrition: Routine in and outs with follow-up chemistries 8.  Acute blood loss anemia.  Follow-up CBC. HGB 9.6 on 6/9  -6/10 HGB stable 10.0 , monitor 9.  History of left BKA 2019.  Patient did receive CIR.  Patient does have a prosthesis.  Independent prior to admission with prosthesis 10.  Diabetes mellitus with peripheral neuropathy.  Hemoglobin A1c 12.4.  Currently on NovoLog  6 units 3 times daily, Semglee  25 units nightly.  Check blood  sugars AC and at bedtime.... Prior to admission patient on Levemir  25 units daily as well as NovoLog  4 units 3 times daily with meals, Glucophage  1000 mg twice daily  CBG (last 3)  Recent Labs    12/26/23 2109 12/27/23 0626 12/27/23 1138  GLUCAP 139* 85 132*  6/10 will decrease Semglee  to 22 units to decrease risk of hypoglycemia 6/11 Decrease to 20u to avoid AM hypoglycemia  11.  CKD stage II.  Follow-up chemistries. Cr 1.03 on 6/10, stable continue to monitor 12.  History of CVA.  Continue Plavix  13. Constipation             -Pt declines additional laxative today, says he will consider if no BM tomorrow   - 6/10 patient declining bowel medications.  Discussed that we do not want constipation to continue to get worse. Agreed to medication this evening if no bowel movement by then, will order sorbitol 60ml if no bowel movement by then  -6/11 1/2 bottle bowel prep ordered, he declines enema/suppository     LOS: 2 days A FACE TO FACE EVALUATION WAS PERFORMED  Lylia Sand 12/27/2023, 11:46 AM

## 2023-12-27 NOTE — Plan of Care (Signed)
  Problem: Consults Goal: RH LIMB LOSS PATIENT EDUCATION Description: Description: See Patient Education module for eduction specifics. Outcome: Progressing   Problem: RH SAFETY Goal: RH STG ADHERE TO SAFETY PRECAUTIONS W/ASSISTANCE/DEVICE Description: STG Adhere to Safety Precautions With  cues Assistance/Device. Outcome: Progressing   Problem: RH PAIN MANAGEMENT Goal: RH STG PAIN MANAGED AT OR BELOW PT'S PAIN GOAL Description: Pain < 4 with prns Outcome: Progressing

## 2023-12-27 NOTE — Progress Notes (Signed)
 Recreational Therapy Session Note  Patient Details  Name: Kevin Bradford MRN: [id number] Date of Birth: [birth year] Today's Date: 12/27/2023  Pain: no c/o Skilled Therapeutic Interventions/Progress Updates:  Pt participated in animal assisted activity seated EOB with supervision.  Pt appreciative of this visit.  Addilyne Backs 12/27/2023, 3:24 PM

## 2023-12-27 NOTE — Progress Notes (Signed)
 Occupational Therapy Session Note  Patient Details  Name: Kevin Bradford MRN: [id number] Date of Birth: 04-09-1946  Today's Date: 12/27/2023 OT Individual Time: 1020-1130 & 1505-1530 OT Individual Time Calculation (min): 70 min & 25 min   Short Term Goals: Week 1:  OT Short Term Goal 1 (Week 1): Pt will complete toilet transfer with min A using LRAD OT Short Term Goal 2 (Week 1): Pt will complete 2/3 toileting steps with CGA for balance OT Short Term Goal 3 (Week 1): Pt will complete sit > stand in prep for ADL with max A  Skilled Therapeutic Interventions/Progress Updates:  Session 1 Skilled OT intervention completed with focus on functional transfers, simulated toileting tasks, core strengthening. Pt received semi supine in bed, agreeable to session. No pain reported.  Pt slow to initiate participation. Transitioned semi upright > sitting EOB with increased time due to posterior bias without LLE prosthesis on. Able to donn LLE prosthesis with set up A. RLE limb guard already on. Able to laterally scoot with min A for clearing buttocks over wheel. Required mod/max A for donning leg rests due to visual deficits. Self-propelled in w/c > gym about 100 ft with supervision. Required max, step by step cues for sequencing for w/c positioning next to mat. Min/mod A to doff leg rests. Min A lateral scoot > EOM with cues again for clearing buttocks over wheel.   Seated EOM, pt completed the following activities to simulate toileting including reaching/wiping with pericare and LB clothing management: -donn/doff looped theraband from upper thighs <> belly button; min difficulty with donning due to challenge with balance and weight shifting to clear band under buttocks however with repetition, increased time and cues pt able to do with supervision  Initially used 3 lb dowel to tap ball thrown to him to challenge sitting balance, however no LOB. Encouraged pt to doff LLE prosthesis for upgraded challenge  and pt initially hesitant but agreeable. Did so independently. Pt caught/tossed un-weighted ball without LOB, however when using dowel to tap ball, with instability posteriorly. Transitioned to modified crunches x20 with 3 lb med ball, with pt hesitant of leaning posterior and CGA/cues needed.   Min A lateral scoot > w/c. Transported > room. Min/mod A squat pivot > BSC over toilet with cues needed for clearing the gap. Pt able to remove LB clothing with supervision and increased time. Pt desired to remain seated for attempt at [facility]. Nursing notified of status and all immediate needs met at end of session.  Session 2 Skilled OT intervention completed with focus on family/pt education, BUE strengthening. Pt received supine in bed with wife present, agreeable to session however declined getting OOB. No pain reported.   Education provided on the following per wife's questions: -typical length of time til prosthesis fitting ~ 3 months -current recommendation/rehab goals of 24/7 supervision for safety -aging in place with a 1 level home vs 2 level to accommodate pt's bilateral amputee situation and accessibility to a full shower vs sponge bathing -discussed installation of temporary ramp for entry to front door (1 STE then door threshold) to reduce caregiver burden and increase pt independence with going outside  Wife did reveal that she assisted with all transfers and completed his bathing and most ADLs for him which is different than the picture pt painted yesterday at mod I/independent level.  Pt transitioned supine > sit with increased time due to posterior bias with overall supervision. Seated EOB pt completed the following: (With red theraband) 2x15 reps Horizontal  abduction Diagonal pulls  Sit > supine with supervision. Pt remained supine in bed, with bed alarm on/activated, and with all needs in reach at end of session.   Therapy Documentation Precautions:  Precautions Precautions:  Fall Precaution/Restrictions Comments: wound vac to R LE Required Braces or Orthoses: Other Brace Other Brace: R LE limb protector, L LE prosthesis Restrictions Weight Bearing Restrictions Per Provider Order: Yes RLE Weight Bearing Per Provider Order: Non weight bearing    Therapy/Group: Individual Therapy  Ruthanna Covert, MS, OTR/L  12/27/2023, 3:48 PM

## 2023-12-27 NOTE — Progress Notes (Signed)
 Physical Therapy Session Note  Patient Details  Name: Kevin Bradford MRN: [id number] Date of Birth: 26-Oct-1945  Today's Date: 12/27/2023 PT Individual Time: 7829-5621 PT Individual Time Calculation (min): 31 min   Short Term Goals: Week 1:  PT Short Term Goal 1 (Week 1): pt will perform bed mobility with CGA overall PT Short Term Goal 2 (Week 1): pt will transfer bed<>chair with LRAD and CGA PT Short Term Goal 3 (Week 1): pt will transfer sit<>stand with LRAD and min A  Skilled Therapeutic Interventions/Progress Updates:   Received pt semi-reclined in bed with wife at bedside. Pt agreeable to PT treatment and reported pain 2/10 in L residual limb (premedicated). Session with emphasis on generalized strengthening and endurance. Pt politely declined any standing (reporting practicing earlier) and requested to work on bed level exercises due to short session. Pt performed the following exercises with emphasis on UE/LE/core strength with increased time and cues for technique (pt attempting to perform exercises using his own technique): -modified crunches using BUE support on bedrails 2x10 -bicep curls with grn TB x20 bilaterally -hip abduction x20 bilaterally -hip adduction pillow squeezes 10x10 second isometric hold Dietician present briefly during session and concluded session with pt semi-reclined in bed, needs within reach, and bed alarm on.   Therapy Documentation Precautions:  Precautions Precautions: Fall Precaution/Restrictions Comments: wound vac to R LE Required Braces or Orthoses: Other Brace Other Brace: R LE limb protector, L LE prosthesis Restrictions Weight Bearing Restrictions Per Provider Order: Yes RLE Weight Bearing Per Provider Order: Non weight bearing  Therapy/Group: Individual Therapy [name] [name] PT, DPT 12/27/2023, 7:00 AM

## 2023-12-27 NOTE — Plan of Care (Signed)
  Problem: RH BOWEL ELIMINATION Goal: RH STG MANAGE BOWEL WITH ASSISTANCE Description: STG Manage Bowel with toileting Assistance. Outcome: Progressing Goal: RH STG MANAGE BOWEL W/MEDICATION W/ASSISTANCE Description: STG Manage Bowel with Medication with mod I Assistance. Outcome: Progressing

## 2023-12-27 NOTE — Progress Notes (Signed)
 Occupational Therapy Session Note  Patient Details  Name: Kevin Bradford MRN: [id number] Date of Birth: 12-11-1945  Today's Date: 12/27/2023 OT Individual Time: 7829-5621 OT Individual Time Calculation (min): 71 min    Short Term Goals: Week 1:  OT Short Term Goal 1 (Week 1): Pt will complete toilet transfer with min A using LRAD OT Short Term Goal 2 (Week 1): Pt will complete 2/3 toileting steps with CGA for balance OT Short Term Goal 3 (Week 1): Pt will complete sit > stand in prep for ADL with max A  Skilled Therapeutic Interventions/Progress Updates:  Pt greeted supine in bed, pt agreeable to OT intervention.      Transfers/bed mobility/functional mobility: pt completed supine>sit with CGA but MOD verbal cues for sequencing and technique.  Attempted sit>stand from elevated EOB with RW with pt unable to clear buttock despite max education on distributing weight forward and creating three points of contact during sit>stand.  Retrieved stedy with pt able to complete semi stand to stedy but pt unable to elevate trunk fully and extend hips fully.  Pt completed lateral scoot back to bed with MIN A and Multimodal cues for set- up and technique. Pt with difficulty shifting weight forward during transfer.    ADLs:  LB dressing: pt donned pants from EOB via lateral leans with supervision.  donned prosthetic EOB with set- up   Exercises: pt completed below BUE therex to facilitate improved global strengthening and endurance with level 2 theraband:  1 min of forward Rows 30 sec rest break 1 min of diagonal pulls  30 sec rest break 1 min of OH rows 30 sec rest break  1 min of bicep curls 30 sec rest break   Ended session with pt supine in bed with all needs within reach and bed alarm activated.                    Therapy Documentation Precautions:  Precautions Precautions: Fall Precaution/Restrictions Comments: wound vac to R LE Required Braces or Orthoses: Other Brace Other Brace:  R LE limb protector, L LE prosthesis Restrictions Weight Bearing Restrictions Per Provider Order: Yes RLE Weight Bearing Per Provider Order: Non weight bearing  Pain: no pain     Therapy/Group: Individual Therapy  Mollie Anger [facility] 12/27/2023, 12:02 PM

## 2023-12-28 DIAGNOSIS — Z89511 Acquired absence of right leg below knee: Secondary | ICD-10-CM | POA: Diagnosis not present

## 2023-12-28 LAB — GLUCOSE, CAPILLARY
Glucose-Capillary: 69 mg/dL — ABNORMAL LOW (ref 70–99)
Glucose-Capillary: 74 mg/dL (ref 70–99)
Glucose-Capillary: 139 mg/dL — ABNORMAL HIGH (ref 70–99)
Glucose-Capillary: 133 mg/dL — ABNORMAL HIGH (ref 70–99)
Glucose-Capillary: 118 mg/dL — ABNORMAL HIGH (ref 70–99)

## 2023-12-28 MED ORDER — MAGNESIUM HYDROXIDE 400 MG/5ML PO SUSP
30.0000 mL | Freq: Once | ORAL | Status: AC
  Administered 2023-12-28: 30 mL via ORAL
  Filled 2023-12-28: qty 30

## 2023-12-28 NOTE — Progress Notes (Signed)
 Occupational Therapy Session Note  Patient Details  Name: Kevin Bradford MRN: [id number] Date of Birth: 07-20-1945  Today's Date: 12/28/2023 OT Individual Time: 0905-1001 OT Individual Time Calculation (min): 56 min    Short Term Goals: Week 1:  OT Short Term Goal 1 (Week 1): Pt will complete toilet transfer with min A using LRAD OT Short Term Goal 2 (Week 1): Pt will complete 2/3 toileting steps with CGA for balance OT Short Term Goal 3 (Week 1): Pt will complete sit > stand in prep for ADL with max A  Skilled Therapeutic Interventions/Progress Updates:  Pt greeted seated in w/c, pt reports increased fatigued requesting to get back in bed.      Transfers/bed mobility/functional mobility:  Pt completed lateral scoot transfer from EOB>w/c to L side with MIN A to get over wheel of w/c. Pt able to transition to supine with CGA. Pt requested time to rest d/t fatigue. While pt rested, messaged MD for order for next size shrinker and called [employer] to alert new order.   Pt then agreeable to work on standing with stedy. Pt agreeable to complete 3 stands in stedy with MODA, had +2 assist for last stand with nurse assisting from behind to fully extend hips into standing.    Education:  Pt reports that he has been sleeping in limb guard, educated pt that he doesn't have to sleep in it as the limb guard is just to protect his limb when he's moving.                  Ended session with pt supine in bed with all needs within reach and bed alarm activated.                    Therapy Documentation Precautions:  Precautions Precautions: Fall Precaution/Restrictions Comments: wound vac to R LE Required Braces or Orthoses: Other Brace Other Brace: R LE limb protector, L LE prosthesis Restrictions Weight Bearing Restrictions Per Provider Order: Yes RLE Weight Bearing Per Provider Order: Non weight bearing  Pain: no pain indicated    Therapy/Group: Individual Therapy  Mollie Anger  [facility] 12/28/2023, 12:11 PM

## 2023-12-28 NOTE — Progress Notes (Signed)
 PROGRESS NOTE   Subjective/Complaints: Milk of mag ordered for constipation Messaged Dr. Julio Ohm regarding whether would vac can be remoed D/ced ISS given hypoglycemia  ROS: Patient denies fever, new vision changes, dizziness, nausea, vomiting, diarrhea,  abdominal pain, shortness of breath or chest pain, headache, or mood change. + constipation- continued  Objective:   No results found. Recent Labs    12/26/23 0449  WBC 5.7  HGB 10.0*  HCT 30.9*  PLT 326   Recent Labs    12/26/23 0449  NA 135  K 4.3  CL 101  CO2 26  GLUCOSE 92  BUN 10  CREATININE 1.03  CALCIUM  8.4*    Intake/Output Summary (Last 24 hours) at 12/28/2023 0946 Last data filed at 12/28/2023 0700 Gross per 24 hour  Intake 358 ml  Output 250 ml  Net 108 ml        Physical Exam: Vital Signs Blood pressure 118/64, pulse 68, temperature 97.6 F (36.4 C), resp. rate 18, height 4' 8.6 (1.438 m), weight 82.3 kg, SpO2 98%.  General: No apparent distress, sitting in WC working with therapy HEENT: Head is normocephalic, atraumatic Neck: Supple without JVD or lymphadenopathy Heart: Reg rate and rhythm. No murmurs rubs or gallops Chest: CTA bilaterally without wheezes, rales, or rhonchi; no distress Abdomen: Soft, non-tender, mildly-distended, bowel sounds positive-hypoactive Extremities: B/L BKA, R BKA with limb protector and wound VAC- no drainage in canister Psych: Pt's affect is flat. Pt is cooperative Skin: Clean and intact without signs of breakdown Neuro:   Mental Status: AAOx3, memory intact Speech/Languate:  fluent, follows simple commands CRANIAL NERVES: 2-12 grossly intact MMT: RUE: 5/5 throughout LUE: 5/5 throughout RLE:  Right BKA LLE: 5/5 throughout   SENSORY: Normal to touch all 4 extremities    Assessment/Plan: 1. Functional deficits which require 3+ hours per day of interdisciplinary therapy in a comprehensive inpatient  rehab setting. Physiatrist is providing close team supervision and 24 hour management of active medical problems listed below. Physiatrist and rehab team continue to assess barriers to discharge/monitor patient progress toward functional and medical goals  Care Tool:  Bathing    Body parts bathed by patient: Right arm, Left arm, Chest, Abdomen, Front perineal area, Right upper leg, Left upper leg, Face   Body parts bathed by helper: Buttocks Body parts n/a: Right lower leg, Left lower leg   Bathing assist Assist Level: Minimal Assistance - Patient > 75%     Upper Body Dressing/Undressing Upper body dressing   What is the patient wearing?: Pull over shirt    Upper body assist Assist Level: Set up assist    Lower Body Dressing/Undressing Lower body dressing      What is the patient wearing?: Pants     Lower body assist Assist for lower body dressing: Minimal Assistance - Patient > 75%     Toileting Toileting    Toileting assist Assist for toileting: Moderate Assistance - Patient 50 - 74%     Transfers Chair/bed transfer  Transfers assist  Chair/bed transfer activity did not occur: Safety/medical concerns  Chair/bed transfer assist level: Minimal Assistance - Patient > 75%     Locomotion Ambulation   Ambulation assist  Ambulation activity did not occur: Safety/medical concerns (fear, weakness, decreased balance)          Walk 10 feet activity   Assist  Walk 10 feet activity did not occur: Safety/medical concerns (fear, weakness, decreased balance)        Walk 50 feet activity   Assist Walk 50 feet with 2 turns activity did not occur: Safety/medical concerns (fear, weakness, decreased balance)         Walk 150 feet activity   Assist Walk 150 feet activity did not occur: Safety/medical concerns (fear, weakness, decreased balance)         Walk 10 feet on uneven surface  activity   Assist Walk 10 feet on uneven surfaces activity did  not occur: Safety/medical concerns (fear, weakness, decreased balance)         Wheelchair     Assist Is the patient using a wheelchair?: Yes Type of Wheelchair: Manual Wheelchair activity did not occur: Safety/medical concerns (weakness)         Wheelchair 50 feet with 2 turns activity    Assist    Wheelchair 50 feet with 2 turns activity did not occur: Safety/medical concerns (weakness)       Wheelchair 150 feet activity     Assist  Wheelchair 150 feet activity did not occur: Safety/medical concerns (weakness)       Blood pressure 118/64, pulse 68, temperature 97.6 F (36.4 C), resp. rate 18, height 4' 8.6 (1.438 m), weight 82.3 kg, SpO2 98%.   Medical Problem List and Plan: 1. 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  Wound VAC as directed             -patient may not shower due to wound vac             -ELOS/Goals: 7 days, Mod I to Sup with PT/OT            Grounds pass ordered  2.  Antithrombotics: -DVT/anticoagulation:  Pharmaceutical: Lovenox              -antiplatelet therapy: Plavix  75 mg daily 3. Pain Management: Tramadol  50 mg every 6 hours 4. Mood/Behavior/Sleep: Melatonin 3 mg nightly.  Provide emotional support             -antipsychotic agents: N/A 5. Neuropsych/cognition: This patient is capable of making decisions on his own behalf. 6. Wound vac: messaged Dr. Duda to see if this can be removed  7. Fluids/Electrolytes/Nutrition: Routine in and outs with follow-up chemistries 8.  Acute blood loss anemia.  Follow-up CBC. HGB 9.6 on 6/9  -6/10 HGB stable 10.0 , monitor 9.  History of left BKA 2019.  Patient did receive CIR.  Patient does have a prosthesis.  Independent prior to admission with prosthesis 10.  Diabetes mellitus with peripheral neuropathy.  Hemoglobin A1c 12.4.  Currently on NovoLog  6 units 3 times daily, Semglee  25 units nightly.  Check blood sugars AC and at bedtime.... Prior  to admission patient on Levemir  25 units daily as well as NovoLog  4 units 3 times daily with meals, Glucophage  1000 mg twice daily  CBG (last 3)  Recent Labs    12/27/23 2120 12/28/23 0630 12/28/23 0713  GLUCAP 123* 69* 74  D/c ISS  11.  CKD stage II.  Follow-up chemistries. Cr 1.03 on 6/10, stable continue to monitor 12.  History of CVA.  Continue Plavix  13. Constipation            Milk of  mag ordered    LOS: 3 days A FACE TO FACE EVALUATION WAS PERFORMED  Kevin Bradford Jjesus Dingley 12/28/2023, 9:46 AM

## 2023-12-28 NOTE — Progress Notes (Signed)
 Physical Therapy Session Note  Patient Details  Name: Kevin Bradford MRN: [id number] Date of Birth: 1945/08/03  Today's Date: 12/28/2023 PT Individual Time: [phone] and 1345-1454 PT Individual Time Calculation (min): 54 min and 69 min  Short Term Goals: Week 1:  PT Short Term Goal 1 (Week 1): pt will perform bed mobility with CGA overall PT Short Term Goal 2 (Week 1): pt will transfer bed<>chair with LRAD and CGA PT Short Term Goal 3 (Week 1): pt will transfer sit<>stand with LRAD and min A  Skilled Therapeutic Interventions/Progress Updates:   Treatment Session 1 Received pt semi-reclined in bed preparing to toilet with NT - PT took over with care. Pt agreeable to PT treatment and reported pain 3.5/10 in L residual limb (premedicated). Session with emphasis on functional mobility/transfers, toileting, and generalized strengthening and endurance. Pt transferred semi-reclined<>sitting L EOB with HOB elevated and use of bedrails with supervision and increased time/effort - pt opting to do it his way despite cues for technique, ultimately having to work much harder. Donned prosthetic with set up assist, pt then insisting on having 2nd person for transfer - after maximal encouragement, pt agreed to attempt with this therapist only. Transferred bed<>WC via lateral scoot with min A, increased time, cues for technique, and encouragement for independence as pt clearly looking for therapist to lift him stating a few times are you going to help me move?  Pt transferred to/from drop arm bedside commode using grab bar via lateral scoot with heavy min A and same cues for technique but no carry over or follow through with cues. Removed underwear via lateral leans with supervision. Pt very particular, requesting to rest residual limb on WC and drink while sitting on commode, but ultimately unsuccessful - RN/MD notified as pt still unable to have BM after ~11 days. Pulled underwear/pants over hips with total A  boosting up with BUEs. Then transferred back into WC in same manner. Pt reported being exhausted and requesting to lie down - encouraged remaining up in [facility] and participating in remainder of session and reminded pt that OT is coming directly after.   Pt transported to/from room in [facility] dependently for energy conservation purposes. Pt performed the following seated exercises with emphasis on UE/core strength/ROM: -bicep curls with 4lb dumbbell 2x12 bilaterally -overhead chest press with 2lb dumbbell 2x10 bilaterally -lateral trunk rotations with 4.4lb medicine ball 2x10 bilaterally  -shoulder extensions into physioball 10x5 second hold Returned to room and concluded session with pt sitting in [facility] with all needs within reach awaiting upcoming OT session.   Treatment Session 2 Received pt sitting EOB finishing lunch, wife arrived shortly after. Pt agreeable to PT treatment and reported pain in R residual limb was tolerable (premedicated and unrated). Session with emphasis on functional mobility/transfers, generalized strengthening and endurance, simulated car transfers, and stair navigation. Donned/doffed prosthetic with setup assist throughout session. Pt transferred bed<>WC via lateral scoot to R with min A with wife actively observing and asking questions - therapist educating wife on technique.   Pt performed WC mobility 196ft using BUE and supervision with emphasis on UE strength/coordination. Pt performed simulated car transfer via lateral scoot with min A with cues for anterior weight shifting, hand placement, and scooting technique. Pt then performed seated BUE strengthening on UBE at workload 1 for 5 minutes on [employer] (although pt unable to keep up with pace of target) and limited by fatigue - 126 revolutions, 22 RPM, 1.8 METs. Discussed home setup with pt/wife reporting  1 small 4in step to enter home. Pt resistant to getting ramp, therefore recommended bumping up/down  step.  Demonstrated technique for bumping pt up/down 5in curb in WC and pt's wife performed x 2 trials (trial 1 with mod A provided by therapist and trial 2 with min A) with cues for technique - discussed need to continue practicing this further in upcoming sessions. Pt reporting 8/10 fatigue and insisting on returning to room, politely stating I've had enough, I'm at the point I need to lie down. Returned to room and performed lateral scoot WC<>bed to L with min A and sit<>supine with supervision. Pt's wife reports sofa bed at home is very low and inquiring about hospital bed - pt requesting to hold off for now and decide later on. Concluded session with pt semi-reclined in bed, needs within reach, and bed alarm on.   Therapy Documentation Precautions:  Precautions Precautions: Fall Precaution/Restrictions Comments: wound vac to R LE Required Braces or Orthoses: Other Brace Other Brace: R LE limb protector, L LE prosthesis Restrictions Weight Bearing Restrictions Per Provider Order: Yes RLE Weight Bearing Per Provider Order: Non weight bearing   Therapy/Group: Individual Therapy [name] [name] PT, DPT 12/28/2023, 6:58 AM

## 2023-12-28 NOTE — IPOC Note (Signed)
 Overall Plan of Care [facility]) Patient Details Name: Kevin Bradford MRN: [id number] DOB: 05-12-1946  Admitting Diagnosis: Right below-knee amputee [facility])  Hospital Problems: Principal Problem:   Right below-knee amputee [facility])     Functional Problem List: Nursing Bowel, Endurance, Safety, Pain, Medication Management, Skin Integrity  PT Balance, Behavior, Edema, Endurance, Motor, Nutrition, Pain, Perception, Safety, Sensory, Skin Integrity  OT Balance, Cognition, Edema, Endurance, Motor, Pain  SLP    TR         Basic ADL's: OT Bathing, Dressing, Toileting     Advanced  ADL's: OT Simple Meal Preparation     Transfers: PT Bed Mobility, Bed to Chair, Car, Occupational psychologist, Research scientist (life sciences): PT Ambulation, Psychologist, prison and probation services, Stairs     Additional Impairments: OT None  SLP        TR      Anticipated Outcomes Item Anticipated Outcome  Self Feeding Independent  Swallowing      Basic self-care  Mod I / supervision  Engineer, technical sales Transfers Supervision  Bowel/Bladder  manage bowel w mod I assist  Transfers  supervision with LRAD  Locomotion  CGA with LRAD  Communication     Cognition     Pain  Pain < 4 with prns  Safety/Judgment  manage safety w cues   Therapy Plan: PT Intensity: Minimum of 1-2 x/day ,45 to 90 minutes PT Frequency: 5 out of 7 days PT Duration Estimated Length of Stay: 12-14 days OT Intensity: Minimum of 1-2 x/day, 45 to 90 minutes OT Frequency: 5 out of 7 days OT Duration/Estimated Length of Stay: 10-12 days     Team Interventions: Nursing Interventions Patient/Family Education, Bowel Management, Disease Management/Prevention, Pain Management, Medication Management, Skin Care/Wound Management, Discharge Planning, Psychosocial Support  PT interventions Ambulation/gait training, Discharge planning, Functional mobility training, Psychosocial support, Therapeutic Activities, Visual/perceptual  remediation/compensation, Balance/vestibular training, Disease management/prevention, Neuromuscular re-education, Skin care/wound management, Therapeutic Exercise, Wheelchair propulsion/positioning, DME/adaptive equipment instruction, Pain management, Splinting/orthotics, UE/LE Strength taining/ROM, Firefighter, Equities trader education, Museum/gallery curator, UE/LE Coordination activities  OT Interventions Warden/ranger, Discharge planning, Pain management, Self Care/advanced ADL retraining, Therapeutic Activities, UE/LE Coordination activities, Cognitive remediation/compensation, Disease mangement/prevention, Functional mobility training, Patient/family education, Skin care/wound managment, Therapeutic Exercise, DME/adaptive equipment instruction, Neuromuscular re-education, UE/LE Strength taining/ROM, Wheelchair propulsion/positioning  SLP Interventions    TR Interventions    SW/CM Interventions Discharge Planning, Psychosocial Support, Patient/Family Education   Barriers to Discharge MD  Medical stability  Nursing Decreased caregiver support, Home environment access/layout 2 level main B+B , bil rail entry; old Left BKA/prosthesis, with wife  PT Home environment access/layout, Wound Care, Weight bearing restrictions, Other (comments) bilateral BKA with L prosthetic, pain, 1 STE, NWB RLE  OT Home environment access/layout, Inaccessible home environment, Weight bearing restrictions, Wound Care    SLP      SW       Team Discharge Planning: Destination: PT-Home ,OT- Home , SLP-  Projected Follow-up: PT-Home health PT, OT-  Home health OT, 24 hour supervision/assistance, SLP-  Projected Equipment Needs: PT-To be determined, OT- To be determined, SLP-  Equipment Details: PT- , OT-  Patient/family involved in discharge planning: PT- Patient,  OT-Patient, SLP-   MD ELOS: 7 days Medical Rehab Prognosis:  Excellent Assessment: The patient has been admitted for CIR therapies  with the diagnosis of 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 The team will be addressing functional mobility, strength, stamina, balance, safety, adaptive techniques and equipment, self-care, bowel and bladder  mgt, patient and caregiver education. Goals have been set at modI/S. Anticipated discharge destination is home.        See Team Conference Notes for weekly updates to the plan of care arly.

## 2023-12-28 NOTE — Plan of Care (Signed)
  Problem: Consults Goal: RH LIMB LOSS PATIENT EDUCATION Description: Description: See Patient Education module for eduction specifics. Outcome: Progressing   Problem: RH SAFETY Goal: RH STG ADHERE TO SAFETY PRECAUTIONS W/ASSISTANCE/DEVICE Description: STG Adhere to Safety Precautions With  cues Assistance/Device. Outcome: Progressing   Problem: RH PAIN MANAGEMENT Goal: RH STG PAIN MANAGED AT OR BELOW PT'S PAIN GOAL Description: Pain < 4 with prns Outcome: Progressing

## 2023-12-28 NOTE — Progress Notes (Signed)
 Hypoglycemic Event  CBG: 69  Treatment: 4 oz juice/soda  Symptoms: None  Follow-up CBG: Time:0712  CBG Result:74  Possible Reasons for Event: Unknown  Comments/MD notified:yes    [name] [name]

## 2023-12-28 NOTE — Patient Care Conference (Addendum)
 Inpatient RehabilitationTeam Conference and Plan of Care Update Date: 12/27/2023   Time: 11:46 AM    Patient Name: Kevin Bradford      Medical Record Number: [id number]  Date of Birth: 1945-09-30 Sex: Male         Room/Bed: 4W19C/4W19C-01 Payor Info: Payor: [employer] MEDICARE / Plan: [employer] MEDICARE CHOICE PPO / Product Type: *No Product type* /    Admit Date/Time:  12/25/2023  4:48 PM  Primary Diagnosis:  Right below-knee amputee [facility])  Hospital Problems: Principal Problem:   Right below-knee amputee [facility]    Expected Discharge Date: Expected Discharge Date: 01/08/24  Team Members Present: Physician leading conference: Dr. Lylia Sand Social Worker Present: Adrianna Albee, LCSW Nurse Present: Forrestine Ike, RN PT Present: [name], PT OT Present: Tim Fontana, OT PPS Coordinator present : Jestine Moron, SLP     Current Status/Progress Goal Weekly Team Focus  Bowel/Bladder   Continent B/B   Will remain continent with B/B   Assist with toilet needs qshift/prn    Swallow/Nutrition/ Hydration               ADL's   Set up A UB, min A LB, mod A toileting   Mod I ADLs, supervision transfers   barriers- fear and particular with mobility, decreased balance/coordination, and weakness/deconditioning, limb care edu    Mobility   bed mobility mod A, lateral scoot transfers min A. Declined standing   supervision, mod I WC level, CGA dynamic standing balace  barriers: fear, particular with mobility, decreased balance/coordination, and weakness/deconditioning    Communication                Safety/Cognition/ Behavioral Observations               Pain   Verbalizes pain level of 3/10 to right surgical incision. Pain well manage with scheduled pain medication   Will verbalizes pain level <3   Assess pt for pain qshift/prn and medicate per provider's order.    Skin   Surgical insicion to right lowe leg (BKA) Wound vac in place   Will be free from infection   Assess surgical site for s/s of infection and promote healing      Discharge Planning:  Home with wife who can assist pt feels been through this before and knows what to expect. Hopes to be mod/i level at DC   Team Discussion: Patient post right BKA with decreased balance and coordination with general weakness and constipation. Functional progress limited by fear and unique methods for doing activities.  Patient on target to meet rehab goals: yes, currently needs set up for upper body care and mod assist for toileting. Requires min-mod assist for lateral scoots; dependent to stand at present.  Goals for discharge set for supervision overall.  *See Care Plan and progress notes for long and short-term goals.   Revisions to Treatment Plan:  N/a   Teaching Needs: Safety, residual limb care, medications, dietary modification, transfers, toileting, etc.   Current Barriers to Discharge: Decreased caregiver support, Home enviroment access/layout, and Behavior  Possible Resolutions to Barriers: Family education HH follow up DME: W/C, RW     Medical Summary Current Status: R BKA, constipation, ABLA, DM2, CKD  Barriers to Discharge: Medical stability;Self-care education;Renal Insufficiency/Failure;Behavior/Mood  Barriers to Discharge Comments: R BKA, constipation, ABLA, DM2, CKD Possible Resolutions to [employer] Focus: Laxatives, adjust insulin , monitor CBC/BMP   Continued Need for Acute Rehabilitation Level of Care: The patient requires daily medical management by a physician  with specialized training in physical medicine and rehabilitation for the following reasons: Direction of a multidisciplinary physical rehabilitation program to maximize functional independence : Yes Medical management of patient stability for increased activity during participation in an intensive rehabilitation regime.: Yes Analysis of laboratory values and/or radiology reports with any subsequent need for  medication adjustment and/or medical intervention. : Yes   I attest that I was present, lead the team conference, and concur with the assessment and plan of the team.   Forrestine Ike B 12/28/2023, 9:42 AM

## 2023-12-29 DIAGNOSIS — Z89511 Acquired absence of right leg below knee: Secondary | ICD-10-CM | POA: Diagnosis not present

## 2023-12-29 DIAGNOSIS — F54 Psychological and behavioral factors associated with disorders or diseases classified elsewhere: Secondary | ICD-10-CM | POA: Diagnosis not present

## 2023-12-29 LAB — GLUCOSE, CAPILLARY
Glucose-Capillary: 77 mg/dL (ref 70–99)
Glucose-Capillary: 136 mg/dL — ABNORMAL HIGH (ref 70–99)
Glucose-Capillary: 112 mg/dL — ABNORMAL HIGH (ref 70–99)

## 2023-12-29 MED ORDER — INSULIN GLARGINE-YFGN 100 UNIT/ML ~~LOC~~ SOLN
19.0000 [IU] | Freq: Every day | SUBCUTANEOUS | Status: DC
  Administered 2023-12-29 – 2024-01-02 (×5): 19 [IU] via SUBCUTANEOUS
  Filled 2023-12-29 (×6): qty 0.19

## 2023-12-29 MED ORDER — FLEET ENEMA RE ENEM
1.0000 | ENEMA | Freq: Once | RECTAL | Status: AC
  Administered 2023-12-29: 1 via RECTAL
  Filled 2023-12-29: qty 1

## 2023-12-29 NOTE — Progress Notes (Signed)
 Physical Therapy Session Note  Patient Details  Name: Stephanos Fan MRN: [id number] Date of Birth: 1946/05/30  Today's Date: 12/29/2023 PT Individual Time: 0732-0843 PT Individual Time Calculation (min): 71 min   Short Term Goals: Week 1:  PT Short Term Goal 1 (Week 1): pt will perform bed mobility with CGA overall PT Short Term Goal 2 (Week 1): pt will transfer bed<>chair with LRAD and CGA PT Short Term Goal 3 (Week 1): pt will transfer sit<>stand with LRAD and min A  Skilled Therapeutic Interventions/Progress Updates:   Received pt semi-reclined in bed, pt agreeable to PT treatment, and reported pain 2/10 in R residual limb (premedicated). Session with emphasis on functional mobility/transfers, generalized strengthening and endurance, dynamic standing balance/coordination. Pt transferred semi-reclined<>sitting L EOB with HOB elevated and use of bedrails with supervision. Doffed dirty shirt and donned clean one with set up assist, then donned L prosthetic with setup assist. Pt reported still not being successful with having BM and feeling slightly nauseous this morning - care team notified.  Encouraged working on sit<>stands as pt reports this is a goal of his - pt requesting to [facility] due to stability. Retrieved Stedy and stood x 5 trials in [place] from elevated EOB with max A fading to mod A (1st trial pt unable to come completely upright but improved with repetitions). In standing worked on LUE raises 3x5 to challenge dynamic standing balance and upright standing tolerance.   Pt transferred bed<>WC via lateral scoot with CGA - cues provided for anterior weight shifting and hand placement. Educated pt on donning/doffing legrests - pt required mod A overall and needs continued practice. Pt performed WC mobility 142ft using BUE and supervision to main therapy gym with emphasis on BUE strength/coordination. Performed lateral scoot to/from mat with CGA via multiple small scoots. Transitioned into  supine on wedge with supervision and performed the following exercises with emphasis on LE strength/ROM and contracture prevention: -L single leg bridge 3x5 -L sidelying R hip abduction 2x10 -L sidelying R hip extension 2x10 -prone R hip extension 2x8 Pt with difficulty transitioning into prone, requiring increased time and cues for rolling technique. Transferred prone<>supine<>sitting EOM with min A and cues for technique and encouragement for independence as pt stating just help me. Returned to room and reported urge to toilet - transferred onto bedside commode via lateral scoot with min A and cues for technique and handed off to NT due to time restrictions.   Therapy Documentation Precautions:  Precautions Precautions: Fall Precaution/Restrictions Comments: wound vac to R LE Required Braces or Orthoses: Other Brace Other Brace: R LE limb protector, L LE prosthesis Restrictions Weight Bearing Restrictions Per Provider Order: Yes RLE Weight Bearing Per Provider Order: Non weight bearing  Therapy/Group: Individual Therapy [name] [name] PT, DPT 12/29/2023, 6:57 AM

## 2023-12-29 NOTE — Progress Notes (Signed)
 Occupational Therapy Session Note  Patient Details  Name: Kevin Bradford MRN: [id number] Date of Birth: May 26, 1946  Today's Date: 12/29/2023 OT Individual Time: 0905-1000 OT Individual Time Calculation (min): 55 min    Short Term Goals: Week 1:  OT Short Term Goal 1 (Week 1): Pt will complete toilet transfer with min A using LRAD OT Short Term Goal 2 (Week 1): Pt will complete 2/3 toileting steps with CGA for balance OT Short Term Goal 3 (Week 1): Pt will complete sit > stand in prep for ADL with max A  Skilled Therapeutic Interventions/Progress Updates:  Skilled OT intervention completed with focus on functional transfers, ADL retraining, BUE strengthening. Pt received upright in bed, agreeable to session. No pain reported. Pt appeared clammy, with report that he still hasn't had a BM. MD present for rounds and aware.  Pt requested HOB to be elevated to 90 degrees, however reports he has standard bed at home. Encouraged pt to trial bed mobility without the features in prep for DC. With bed elevated about 35 degrees, pt able to transition semi upright > sit with heavy reliance on bed rail but overall supervision. Set up A for donning of LLE prosthesis, RLE limb guard already in place. Cues needed for sequencing in prep for transfer, but pt overall CGA for series of lateral scoots > w/c > R.  Brushed teeth, washed UB, dressed UB with set up A at sink. Required mod A to donn both leg rests. Self-propelled with supervision about 50 ft prior to fatigue. Transported rest of way > gym. Required max cues for sequencing to position w/c and prep for transfer, only supervision A with slightly improved speed with command following. CGA lateral scoot > EOM.   Pt requested to work on BUE strength. Seated EOM, pt completed the following BUE exercises to promote BUE strength/endurance needed for independence with functional transfers and BADLs: (With 4 lb dumbbell) -2x15 chest press -x15 overhead press  (un-weighted on LUE) -2x15 bicep curls  CGA lateral scoot from EOM > w/c. Back in room, pt remained seated in w/c, with chair alarm on/activated, and with all needs in reach at end of session.   Therapy Documentation Precautions:  Precautions Precautions: Fall Precaution/Restrictions Comments: wound vac to R LE Required Braces or Orthoses: Other Brace Other Brace: R LE limb protector, L LE prosthesis Restrictions Weight Bearing Restrictions Per Provider Order: Yes RLE Weight Bearing Per Provider Order: Non weight bearing    Therapy/Group: Individual Therapy  Ruthanna Covert, MS, OTR/L  12/29/2023, 12:07 PM

## 2023-12-29 NOTE — Progress Notes (Signed)
 PROGRESS NOTE   Subjective/Complaints: Continues to be constipated, discussed plan for enema today He does not feel abdominal pain, he does have nausea  ROS: Patient denies fever, new vision changes, dizziness, nausea, vomiting, diarrhea,  abdominal pain, shortness of breath or chest pain, headache, or mood change. + constipation- continued +nausea  Objective:   No results found. No results for input(s): WBC, HGB, HCT, PLT in the last 72 hours.  No results for input(s): NA, K, CL, CO2, GLUCOSE, BUN, CREATININE, CALCIUM  in the last 72 hours.   Intake/Output Summary (Last 24 hours) at 12/29/2023 1310 Last data filed at 12/29/2023 0707 Gross per 24 hour  Intake 836 ml  Output 950 ml  Net -114 ml        Physical Exam: Vital Signs Blood pressure 124/88, pulse 81, temperature 98 F (36.7 C), resp. rate 19, height 4' 8.6 (1.438 m), weight 82.3 kg, SpO2 99%.  General: No apparent distress, sitting in WC working with therapy HEENT: Head is normocephalic, atraumatic Neck: Supple without JVD or lymphadenopathy Heart: Reg rate and rhythm. No murmurs rubs or gallops Chest: CTA bilaterally without wheezes, rales, or rhonchi; no distress Abdomen: Soft, non-tender, mildly-distended, bowel sounds positive-hypoactive Extremities: B/L BKA, R BKA with limb protector and wound VAC- no drainage in canister Psych: Pt's affect is flat. Pt is cooperative Skin: Clean and intact without signs of breakdown Neuro:   Mental Status: AAOx3, memory intact Speech/Languate:  fluent, follows simple commands CRANIAL NERVES: 2-12 grossly intact MMT: 5/5 throughout except for R BKA   SENSORY: Normal to touch all 4 extremities    Assessment/Plan: 1. Functional deficits which require 3+ hours per day of interdisciplinary therapy in a comprehensive inpatient rehab setting. Physiatrist is providing close team supervision and  24 hour management of active medical problems listed below. Physiatrist and rehab team continue to assess barriers to discharge/monitor patient progress toward functional and medical goals  Care Tool:  Bathing    Body parts bathed by patient: Right arm, Left arm, Chest, Abdomen, Front perineal area, Right upper leg, Left upper leg, Face   Body parts bathed by helper: Buttocks Body parts n/a: Right lower leg, Left lower leg   Bathing assist Assist Level: Minimal Assistance - Patient > 75%     Upper Body Dressing/Undressing Upper body dressing   What is the patient wearing?: Pull over shirt    Upper body assist Assist Level: Set up assist    Lower Body Dressing/Undressing Lower body dressing      What is the patient wearing?: Pants     Lower body assist Assist for lower body dressing: Minimal Assistance - Patient > 75%     Toileting Toileting    Toileting assist Assist for toileting: Moderate Assistance - Patient 50 - 74%     Transfers Chair/bed transfer  Transfers assist  Chair/bed transfer activity did not occur: Safety/medical concerns  Chair/bed transfer assist level: Contact Guard/Touching assist (lateral scoot)     Locomotion Ambulation   Ambulation assist   Ambulation activity did not occur: Safety/medical concerns (fear, weakness, decreased balance)          Walk 10 feet activity   Assist  Walk 10 feet activity did not occur: Safety/medical concerns (fear, weakness, decreased balance)        Walk 50 feet activity   Assist Walk 50 feet with 2 turns activity did not occur: Safety/medical concerns (fear, weakness, decreased balance)         Walk 150 feet activity   Assist Walk 150 feet activity did not occur: Safety/medical concerns (fear, weakness, decreased balance)         Walk 10 feet on uneven surface  activity   Assist Walk 10 feet on uneven surfaces activity did not occur: Safety/medical concerns (fear, weakness,  decreased balance)         Wheelchair     Assist Is the patient using a wheelchair?: Yes Type of Wheelchair: Manual Wheelchair activity did not occur: Safety/medical concerns (weakness)         Wheelchair 50 feet with 2 turns activity    Assist    Wheelchair 50 feet with 2 turns activity did not occur: Safety/medical concerns (weakness)       Wheelchair 150 feet activity     Assist  Wheelchair 150 feet activity did not occur: Safety/medical concerns (weakness)       Blood pressure 124/88, pulse 81, temperature 98 F (36.7 C), resp. rate 19, height 4' 8.6 (1.438 m), weight 82.3 kg, SpO2 99%.   Medical Problem List and Plan: 1. 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  Wound VAC as directed             -patient may not shower due to wound vac             -ELOS/Goals: 7 days, Mod I to Sup with PT/OT            Grounds pass ordered  Continue CIR  2.  Antithrombotics: -DVT/anticoagulation:  Pharmaceutical: continue Lovenox              -antiplatelet therapy: Plavix  75 mg daily  3. Pain Management: Tramadol  50 mg every 6 hours 4. Mood/Behavior/Sleep: Melatonin 3 mg nightly.  Provide emotional support             -antipsychotic agents: N/A 5. Neuropsych/cognition: This patient is capable of making decisions on his own behalf. 6. Wound vac: messaged Dr. Duda to see if this can be removed  7. Fluids/Electrolytes/Nutrition: Routine in and outs with follow-up chemistries 8.  Acute blood loss anemia.  Follow-up CBC. HGB 9.6 on 6/9  -6/10 HGB stable 10.0 , monitor 9.  History of left BKA 2019.  Patient did receive CIR.  Patient does have a prosthesis.  Independent prior to admission with prosthesis 10.  Diabetes mellitus with peripheral neuropathy.  Hemoglobin A1c 12.4.  Decrease semglee  to 19U HS  CBG (last 3)  Recent Labs    12/28/23 2120 12/29/23 0640 12/29/23 1214  GLUCAP 118* 77 136*  D/c ISS  11.   CKD stage II.  Follow-up chemistries. Cr 1.03 on 6/10, stable continue to monitor 12.  History of CVA.  Continue Plavix  13. Constipation            Enema today    LOS: 4 days A FACE TO FACE EVALUATION WAS PERFORMED  Zalmen Wrightsman P Masako Overall 12/29/2023, 1:10 PM

## 2023-12-29 NOTE — Consult Note (Signed)
 Neuropsychological Consultation Comprehensive Inpatient Rehab   Patient:   Kevin Bradford   DOB:   03/11/1946  MR Number:  [id number]  Location:  [facility] [address] CENTER A [address] [place] [address] Dept: [phone] Loc: [phone]           Date of Service:   12/29/2023  Start Time:   10 AM End Time:   11 AM  Provider/Observer:  Chapman Commodore, Psy.D.       Clinical Neuropsychologist       Billing Code/Service: [phone]  Reason for Service:    Kevin Bradford is a 75 year old right-handed male who was referred for neuropsychological consultation during his current admission to the comprehensive inpatient rehabilitation unit due to coping and adjustment issues with new right BKA after having left BKA in 2019.  The patient's past medical history includes poorly controlled diabetes with peripheral neuropathy, history of CVA, chronic kidney disease stage II, left BKA 12/27/2017 as well as revision in 2019.  Patient was seen on the comprehensive inpatient rehabilitation unit at the time of his first BKA and during the admission at that time I saw the patient on 1 occasion.  Patient presented on 12/15/2023 with right foot infection/abscess and osteomyelitis with no successful change with conservative care and required right BKA on 12/20/2023.  During today's clinical visit the patient did not recall meeting me during his previous admission and patient had little recall of that admission overall.  He did remember the actual surgery itself.  Patient acknowledged having more difficulty coping with what to expect now that he has lost both of his lower legs.  The patient had been very active after the first BKA and continued to do many of his previously normal activities including driving and doing work activities.  The patient is hopeful that he will be able to manage with 2 prostatic devices but has a lot of concern about that possibility.   Today the primary focus was on coping and adjustment with his current status and maintaining motivation to continue with therapeutic efforts not only while he is admitted onto the unit but also post discharge.  Patient denied any acute emotional distress to the point that it was limiting his participation in therapeutic efforts.  HPI for the current admission:    HPI: Kevin Bradford is a 78 year old right handed male with history significant for poorly controlled diabetes mellitus with peripheral neuropathy, history of CVA, CKD stage II, left BKA 12/27/2017 as well as requiring revision due to dehiscence of wound and received CIR 11/24/2017 - 11/30/2017. Per chart review patient lives with spouse. Two-level home bed and bath main level one-step to entry. Independent/active with left prosthesis independent ADLs and driving. Good family support. Presented 12/15/2023 with right foot infection/abscess with calcaneal osteomyelitis followed by orthopedic services and no change with conservative care requiring right BKA 12/20/2023 per Dr. Julio Ohm. Wound VAC as directed. Lovenox  for DVT prophylaxis. Patient remained on chronic Plavix  for history of CVA. Acute blood loss anemia 9.6 and monitored. Renal function remained stable with latest creatinine 1.07. Has not had BM in few days. Reports pain is controlled. Therapy evaluations completed due to patient's decreased functional mobility was admitted for a comprehensive rehab program.   Medical History:   Past Medical History:  Diagnosis Date   Arthritis    joint pain (08/16/2017)   Below knee amputation (HCC)    Cellulitis and abscess of leg 10/18/2014   CKD (chronic  kidney disease) stage 3, GFR 30-59 ml/min (HCC)    creatinine increases with antibiotics per pt, no known kidney disease per patient   Dehiscence of closure of skin, subsequent encounter    Diabetic foot ulcer (HCC)    left   Diabetic peripheral neuropathy (HCC)    GERD (gastroesophageal reflux disease)     12/26/2017- not this year.    Osteomyelitis (HCC)    left transmetatarsal    Stroke (HCC)    Type II diabetes mellitus [facility])          Patient Active Problem List   Diagnosis Date Noted   Coping style affecting medical condition 12/29/2023   Malnutrition of moderate degree 12/25/2023   Right below-knee amputee (HCC) 12/25/2023   Diabetic infection of right foot (HCC) 12/16/2023   History of stroke 12/16/2023   Acute osteomyelitis of right calcaneus (HCC) 12/16/2023   Cutaneous abscess of right foot 12/16/2023   DKA, type 2 (HCC) 12/08/2023   Hypokalemia 12/08/2023   S/P BKA (below knee amputation), left (HCC) 12/08/2023   Hyperosmolar hyperglycemic state (HHS) (HCC) 12/07/2023   Stroke (HCC)    Symptomatic bradycardia 05/06/2018   Postural dizziness with near syncope 05/06/2018   Labile blood glucose    Acute blood loss anemia    Labile blood pressure    CKD (chronic kidney disease) stage 2, GFR 60-89 ml/min    Type 2 diabetes mellitus with peripheral neuropathy (HCC)    Post-operative pain    Amputation of left lower extremity below knee upon examination (HCC) 11/24/2017   Amputated below knee, left (HCC) 11/22/2017   Osteomyelitis of left foot (HCC)    Dehiscence of amputation stump (HCC) 08/11/2017   S/P transmetatarsal amputation of foot, left (HCC) 07/19/2017   Subacute osteomyelitis, left ankle and foot (HCC) 07/17/2017   Diabetic ulcer of left foot associated with type 2 diabetes mellitus (HCC)    Diabetes mellitus with renal complications (HCC) 10/17/2014   Diabetes (HCC) 10/13/2014   Normocytic anemia 10/13/2014   Hyperkalemia 10/13/2014    Behavioral Observation/Mental Status:   [name]  presents as a 78 y.o.-year-old Right handed Caucasian Male who appeared his stated age. his dress was Appropriate and he was Well Groomed and his manners were Appropriate to the situation.  his participation was indicative of Appropriate behaviors.  There were physical  disabilities noted.  he displayed an appropriate level of cooperation and motivation.    Interactions:    Active Appropriate  Attention:   abnormal and attention span appeared shorter than expected for age  Memory:   within normal limits; recent and remote memory intact  Visuo-spatial:   not examined  Speech (Volume):  normal  Speech:   normal; normal  Thought Process:  Coherent and Relevant  Coherent and Linear  Though Content:  WNL; not suicidal and not homicidal  Orientation:   person, place, time/date, and situation  Judgment:   Good  Planning:   Fair  Affect:    Appropriate  Mood:    Dysphoric  Insight:   Good  Intelligence:   normal  Psychiatric History:  Psychiatric history    Family Med/Psych History:  Family History  Problem Relation Age of Onset   Dementia Mother    Impression/DX:   Amari Zagal is a 78 year old right-handed male who was referred for neuropsychological consultation during his current admission to the comprehensive inpatient rehabilitation unit due to coping and adjustment issues with new right BKA after having left BKA in 2019.  The patient's past medical history includes poorly controlled diabetes with peripheral neuropathy, history of CVA, chronic kidney disease stage II, left BKA 12/27/2017 as well as revision in 2019.  Patient was seen on the comprehensive inpatient rehabilitation unit at the time of his first BKA and during the admission at that time I saw the patient on 1 occasion.  Patient presented on 12/15/2023 with right foot infection/abscess and osteomyelitis with no successful change with conservative care and required right BKA on 12/20/2023.  During today's clinical visit the patient did not recall meeting me during his previous admission and patient had little recall of that admission overall.  He did remember the actual surgery itself.  Patient acknowledged having more difficulty coping with what to expect now that he has lost both  of his lower legs.  The patient had been very active after the first BKA and continued to do many of his previously normal activities including driving and doing work activities.  The patient is hopeful that he will be able to manage with 2 prostatic devices but has a lot of concern about that possibility.  Today the primary focus was on coping and adjustment with his current status and maintaining motivation to continue with therapeutic efforts not only while he is admitted onto the unit but also post discharge.  Patient denied any acute emotional distress to the point that it was limiting his participation in therapeutic efforts.   Diagnosis:    Coping style and coping strategies potentially affecting medical status         Electronically Signed   _______________________ Chapman Commodore, Psy.D. Clinical Neuropsychologist

## 2023-12-29 NOTE — Plan of Care (Signed)
  Problem: Consults Goal: RH LIMB LOSS PATIENT EDUCATION Description: Description: See Patient Education module for eduction specifics. Outcome: Progressing   Problem: RH SAFETY Goal: RH STG ADHERE TO SAFETY PRECAUTIONS W/ASSISTANCE/DEVICE Description: STG Adhere to Safety Precautions With  cues Assistance/Device. Outcome: Progressing   Problem: RH PAIN MANAGEMENT Goal: RH STG PAIN MANAGED AT OR BELOW PT'S PAIN GOAL Description: Pain < 4 with prns Outcome: Progressing   Problem: RH KNOWLEDGE DEFICIT LIMB LOSS Goal: RH STG INCREASE KNOWLEDGE OF SELF CARE AFTER LIMB LOSS Description: Patient and spouse will be able to manage care at discharge using educational resources for medication, skin care and dietary modification independently Outcome: Progressing

## 2023-12-29 NOTE — Plan of Care (Signed)
  Problem: Consults Goal: RH LIMB LOSS PATIENT EDUCATION Description: Description: See Patient Education module for eduction specifics. Outcome: Progressing   Problem: RH BOWEL ELIMINATION Goal: RH STG MANAGE BOWEL WITH ASSISTANCE Description: STG Manage Bowel with toileting Assistance. Outcome: Not Progressing   Problem: RH SAFETY Goal: RH STG ADHERE TO SAFETY PRECAUTIONS W/ASSISTANCE/DEVICE Description: STG Adhere to Safety Precautions With  cues Assistance/Device. Outcome: Progressing   Problem: RH PAIN MANAGEMENT Goal: RH STG PAIN MANAGED AT OR BELOW PT'S PAIN GOAL Description: Pain < 4 with prns Outcome: Progressing   Problem: RH KNOWLEDGE DEFICIT LIMB LOSS Goal: RH STG INCREASE KNOWLEDGE OF SELF CARE AFTER LIMB LOSS Description: Patient and spouse will be able to manage care at discharge using educational resources for medication, skin care and dietary modification independently Outcome: Progressing

## 2023-12-29 NOTE — Progress Notes (Signed)
 Physical Therapy Group Session Note  Patient Details  Name: Kevin Bradford MRN: [id number] Date of Birth: 1946/06/02  Today's Date: 12/29/2023 PT Group Time: 1100-1200 PT Group Time Calculation (min): 60 min  Short Term Goals: Week 1:  PT Short Term Goal 1 (Week 1): pt will perform bed mobility with CGA overall PT Short Term Goal 2 (Week 1): pt will transfer bed<>chair with LRAD and CGA PT Short Term Goal 3 (Week 1): pt will transfer sit<>stand with LRAD and min A  Skilled Therapeutic Interventions/Progress Updates:   Pt participated in limb loss group in social setting with emphasis on stress management and coping strategies to manage new diagnosis to allow for improved mental health to improve overall quality of life. Provided active listening, emotional support and therapeutic use of self. Education provided on pain management including desensitization techniques and mirror therapy, and prosthetic fitting timeline. Issued and reviewed handouts on balanced amputee wellness and educational communities, amputee support group of the triad, contracture prevention, limb protector, and shrinker wear/care guidelines. Pt demonstrated to other patients in group components of his prosthetic and how to don/doff it. Also discussed activity modification in relation to pt's goals and hobbies. Pt actively engaged throughout and appreciative of group session.  Therapy Documentation Precautions:  Precautions Precautions: Fall Precaution/Restrictions Comments: wound vac to R LE Required Braces or Orthoses: Other Brace Other Brace: R LE limb protector, L LE prosthesis Restrictions Weight Bearing Restrictions Per Provider Order: Yes RLE Weight Bearing Per Provider Order: Non weight bearing   Therapy/Group: Group Therapy [name] [name] PT, DPT 12/29/2023, 7:15 AM

## 2023-12-30 DIAGNOSIS — M869 Osteomyelitis, unspecified: Secondary | ICD-10-CM | POA: Diagnosis not present

## 2023-12-30 DIAGNOSIS — D62 Acute posthemorrhagic anemia: Secondary | ICD-10-CM | POA: Diagnosis not present

## 2023-12-30 DIAGNOSIS — Z89511 Acquired absence of right leg below knee: Secondary | ICD-10-CM | POA: Diagnosis not present

## 2023-12-30 DIAGNOSIS — E1142 Type 2 diabetes mellitus with diabetic polyneuropathy: Secondary | ICD-10-CM | POA: Diagnosis not present

## 2023-12-30 LAB — GLUCOSE, CAPILLARY
Glucose-Capillary: 108 mg/dL — ABNORMAL HIGH (ref 70–99)
Glucose-Capillary: 124 mg/dL — ABNORMAL HIGH (ref 70–99)
Glucose-Capillary: 153 mg/dL — ABNORMAL HIGH (ref 70–99)
Glucose-Capillary: 136 mg/dL — ABNORMAL HIGH (ref 70–99)

## 2023-12-30 NOTE — Progress Notes (Signed)
 Given PRN Fleet Enema in the evening. With success. Multiple bowel movements following administration of enema.

## 2023-12-30 NOTE — Plan of Care (Signed)
  Problem: Consults Goal: RH LIMB LOSS PATIENT EDUCATION Description: Description: See Patient Education module for eduction specifics. Outcome: Progressing   Problem: RH BOWEL ELIMINATION Goal: RH STG MANAGE BOWEL WITH ASSISTANCE Description: STG Manage Bowel with toileting Assistance. Outcome: Progressing Goal: RH STG MANAGE BOWEL W/MEDICATION W/ASSISTANCE Description: STG Manage Bowel with Medication with mod I  Assistance. Outcome: Progressing   Problem: RH SAFETY Goal: RH STG ADHERE TO SAFETY PRECAUTIONS W/ASSISTANCE/DEVICE Description: STG Adhere to Safety Precautions With  cues Assistance/Device. Outcome: Progressing   Problem: RH PAIN MANAGEMENT Goal: RH STG PAIN MANAGED AT OR BELOW PT'S PAIN GOAL Description: Pain < 4 with prns Outcome: Progressing   Problem: RH KNOWLEDGE DEFICIT LIMB LOSS Goal: RH STG INCREASE KNOWLEDGE OF SELF CARE AFTER LIMB LOSS Description: Patient and spouse will be able to manage care at discharge using educational resources for medication, skin care and dietary modification independently Outcome: Progressing

## 2023-12-30 NOTE — Progress Notes (Signed)
 PROGRESS NOTE   Subjective/Complaints: Pt had bm yesterday. Reports pain is controlled. Left BK prosthesis appears to be functioning satisfactorily   ROS: Patient denies fever, rash, sore throat, blurred vision, dizziness, nausea, vomiting, diarrhea, cough, shortness of breath or chest pain,   headache, or mood change.   Objective:   No results found. No results for input(s): WBC, HGB, HCT, PLT in the last 72 hours.  No results for input(s): NA, K, CL, CO2, GLUCOSE, BUN, CREATININE, CALCIUM  in the last 72 hours.   Intake/Output Summary (Last 24 hours) at 12/30/2023 0846 Last data filed at 12/30/2023 0800 Gross per 24 hour  Intake 826 ml  Output 550 ml  Net 276 ml        Physical Exam: Vital Signs Blood pressure 110/67, pulse 67, temperature 98 F (36.7 C), resp. rate 16, height 4' 8.6 (1.438 m), weight 82.3 kg, SpO2 97%.  Constitutional: No distress . Vital signs reviewed. HEENT: NCAT, EOMI, oral membranes moist Neck: supple Cardiovascular: RRR without murmur. No JVD    Respiratory/Chest: CTA Bilaterally without wheezes or rales. Normal effort    GI/Abdomen: BS +, non-tender, non-distended Ext: no clubbing, cyanosis, or edema Psych: pleasant and cooperative  Skin: Clean and intact without signs of breakdown. Prevena vac in place with suction. Right BK well shaped with shrinker in place Neuro:   Mental Status: AAOx3, memory intact Speech/Languate:  fluent, follows simple commands CRANIAL NERVES: 2-12 grossly intact MMT: 5/5 throughout except for R BKA   SENSORY: Normal to touch all 4 extremities Prior neuro assessment is c/w today's exam 12/30/2023.    Assessment/Plan: 1. Functional deficits which require 3+ hours per day of interdisciplinary therapy in a comprehensive inpatient rehab setting. Physiatrist is providing close team supervision and 24 hour management of active medical  problems listed below. Physiatrist and rehab team continue to assess barriers to discharge/monitor patient progress toward functional and medical goals  Care Tool:  Bathing    Body parts bathed by patient: Right arm, Left arm, Chest, Abdomen, Front perineal area, Right upper leg, Left upper leg, Face   Body parts bathed by helper: Buttocks Body parts n/a: Right lower leg, Left lower leg   Bathing assist Assist Level: Minimal Assistance - Patient > 75%     Upper Body Dressing/Undressing Upper body dressing   What is the patient wearing?: Pull over shirt    Upper body assist Assist Level: Set up assist    Lower Body Dressing/Undressing Lower body dressing      What is the patient wearing?: Pants     Lower body assist Assist for lower body dressing: Minimal Assistance - Patient > 75%     Toileting Toileting    Toileting assist Assist for toileting: Moderate Assistance - Patient 50 - 74%     Transfers Chair/bed transfer  Transfers assist  Chair/bed transfer activity did not occur: Safety/medical concerns  Chair/bed transfer assist level: Contact Guard/Touching assist (lateral scoot)     Locomotion Ambulation   Ambulation assist   Ambulation activity did not occur: Safety/medical concerns (fear, weakness, decreased balance)          Walk 10 feet activity   Assist  Walk 10 feet activity did not occur: Safety/medical concerns (fear, weakness, decreased balance)        Walk 50 feet activity   Assist Walk 50 feet with 2 turns activity did not occur: Safety/medical concerns (fear, weakness, decreased balance)         Walk 150 feet activity   Assist Walk 150 feet activity did not occur: Safety/medical concerns (fear, weakness, decreased balance)         Walk 10 feet on uneven surface  activity   Assist Walk 10 feet on uneven surfaces activity did not occur: Safety/medical concerns (fear, weakness, decreased balance)          Wheelchair     Assist Is the patient using a wheelchair?: Yes Type of Wheelchair: Manual Wheelchair activity did not occur: Safety/medical concerns (weakness)         Wheelchair 50 feet with 2 turns activity    Assist    Wheelchair 50 feet with 2 turns activity did not occur: Safety/medical concerns (weakness)       Wheelchair 150 feet activity     Assist  Wheelchair 150 feet activity did not occur: Safety/medical concerns (weakness)       Blood pressure 110/67, pulse 67, temperature 98 F (36.7 C), resp. rate 16, height 4' 8.6 (1.438 m), weight 82.3 kg, SpO2 97%.   Medical Problem List and Plan: 1. 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  Wound VAC as directed             -patient may not shower due to wound vac             -ELOS/Goals: 7 days, Mod I to Sup with PT/OT            Grounds pass ordered  -Continue CIR therapies including PT, OT   2.  Antithrombotics: -DVT/anticoagulation:  Pharmaceutical: continue Lovenox              -antiplatelet therapy: Plavix  75 mg daily  3. Pain Management: Tramadol  50 mg every 6 hours 4. Mood/Behavior/Sleep: Melatonin 3 mg nightly.  Provide emotional support             -antipsychotic agents: N/A 5. Neuropsych/cognition: This patient is capable of making decisions on his own behalf. 6. Wound vac: messaged Dr. Duda to see if this can be removed  Pt has had wound vac for 10 days. Will recheck Monday but his likely can be removed 7. Fluids/Electrolytes/Nutrition: Routine in and outs with follow-up chemistries 8.  Acute blood loss anemia.  Follow-up CBC. HGB 9.6 on 6/9  -6/10 HGB stable 10.0 , monitor 9.  History of left BKA 2019.  Patient did receive CIR.  Patient does have a prosthesis.  Independent prior to admission with prosthesis 10.  Diabetes mellitus with peripheral neuropathy.  Hemoglobin A1c 12.4.  Decrease semglee  to 19U HS  CBG (last 3)  Recent Labs     12/29/23 1214 12/29/23 1643 12/30/23 0557  GLUCAP 136* 112* 108*  D/c ISS  6/14 cbg's tightly controlled 11.  CKD stage II.  Follow-up chemistries. Cr 1.03 on 6/10, stable continue to monitor 12.  History of CVA.  Continue Plavix  13. Constipation            Pt had bm 6/13    LOS: 5 days A FACE TO FACE EVALUATION WAS PERFORMED  Kevin Bradford 12/30/2023, 8:46 AM

## 2023-12-30 NOTE — Progress Notes (Signed)
 Physical Therapy Session Note  Patient Details  Name: Kevin Bradford MRN: [id number] Date of Birth: 02-24-1946  Today's Date: 12/30/2023 PT Individual Time: 1345-1454 PT Individual Time Calculation (min): 69 min   Short Term Goals: Week 1:  PT Short Term Goal 1 (Week 1): pt will perform bed mobility with CGA overall PT Short Term Goal 2 (Week 1): pt will transfer bed<>chair with LRAD and CGA PT Short Term Goal 3 (Week 1): pt will transfer sit<>stand with LRAD and min A  Skilled Therapeutic Interventions/Progress Updates:   Received pt semi-reclined in bed with wife at bedside. Pt agreeable to PT treatment and reported pain <2/10 in R residual limb (premedicated). Session with emphasis on functional mobility/transfers, family education training, generalized strengthening and endurance, and WC mobility. Pt's wife showed therapist pictures of home entry with 1+1 4in steps to enter door. In previous sessions, had pt's wife practice bumping up/down step in [facility] but reports pt's sons are currently installing ramp. Also showed pictures of sofa bed that pt will be sleeping on and discussed hospital bed (pt's son's pushing for hospital bed but pt does not want one).  Pt transferred semi-reclined<>sitting L EOB with HOB elevated and use of bedrails with supervision using momentum. Informed pt/wife of option to purchase bed assist rail for pt to use to pull on but pt requesting to wait on that option. RN present administer medication and pt donned L prosthetic with setup assist. Pt performed lateral scoot transfer bed<>WC with CGA/close supervision and assist to stabilize WC. Educated pt/wife on donning/doffing legrests - performed with min A and cues but pt/wife need continued practice.   Pt performed WC mobility 141ft using BUE and supervision with emphasis on UE strength/coordination. Pt then transferred to/from mat via lateral scoot with close supervision provided by wife (educated pt's wife on body  mechanics and positioning for transfers) - therapist videotaped transfer for pt's wife to show their sons.  Pt required min A to don/doff limb guard, then performed the following exercises with emphasis on LE/UE/core strength/ROM: -R knee flexion/extension ROM 2x20 -R hip flexion ROM 2x20 Sat on wobble disc and performed bicep curls with 7lb dowel 2x15, overhead chest press with 2lb dowel 2x10, and lateral trunk rotations with 4.4lb medicine ball 2x12 bilaterally. Pt then worked on UE strength, coordination, proprioception and reaction time balling ball with 2lb dowel x20 reps. Pt politely declined working on standing this afternoon. During rest breaks discussed that pt will discharge home at a [facility] level until receiving his 2nd prosthetic.   Transported back to room in [facility] dependently and requested to return to bed. Pt transferred WC<>bed via lateral scoot with close supervision provided by wife. Pt able to direct 90% of transfer without assist from therapist. Removed prosthetic and transferred into supine with supervision. Concluded session with pt semi-reclined in bed, needs within reach, and bed alarm on. Wife present at bedside.   Therapy Documentation Precautions:  Precautions Precautions: Fall Precaution/Restrictions Comments: wound vac to R LE Required Braces or Orthoses: Other Brace Other Brace: R LE limb protector, L LE prosthesis Restrictions Weight Bearing Restrictions Per Provider Order: Yes RLE Weight Bearing Per Provider Order: Weight bearing as tolerated  Therapy/Group: Individual Therapy [name] [name] PT, DPT 12/30/2023, 6:53 AM

## 2023-12-30 NOTE — Progress Notes (Signed)
 Occupational Therapy Session Note  Patient Details  Name: Kevin Bradford MRN: [id number] Date of Birth: 10/06/1945  Today's Date: 12/30/2023 OT Individual Time: 1005-1045 OT Individual Time Calculation (min): 40 min    Short Term Goals: Week 1:  OT Short Term Goal 1 (Week 1): Pt will complete toilet transfer with min A using LRAD OT Short Term Goal 2 (Week 1): Pt will complete 2/3 toileting steps with CGA for balance OT Short Term Goal 3 (Week 1): Pt will complete sit > stand in prep for ADL with max A  Skilled Therapeutic Interventions/Progress Updates:  Skilled OT intervention completed with focus on functional transfers, activity tolerance, BUE strengthening. Pt received supine in bed, agreeable to session. 2/10 pain reported; declined pain meds. Pt did report poor night of sleep from frequent BM. OT offered rest breaks and repositioning throughout for pain reduction.  OT lowered side bed rail in prep for bed mobility, however raised it back up in dependence for assist for supine > sit transition. Donned L prosthesis independently. Required cues to lift arm rest of w/c in prep for transfer but no assist to do so. CGA lateral scoot requiring several scoots > w/c. Most difficulty over w/c wheel. Pt reported lightheadedness; BP WNL (see below).  Transported dependently in w/c <> gym for time. Seated in w/c, pt completed the following BUE exercises to promote BUE strength/endurance needed for independence with functional transfers and BADLs: (With yellow theraband) 15 reps Horizontal abduction Self-anchored shoulder flexion each arm Self-anchored bicep flexion each arm Self-anchored tricep extension each arm Shoulder external rotation Shoulder diagonal pulls  Back in room, pt remained seated in w/c, with chair alarm on/activated, and with all needs in reach at end of session.  Vitals BP 122/70 (sitting in w/c after transfer), HR 68 bpm  Therapy Documentation Precautions:   Precautions Precautions: Fall Precaution/Restrictions Comments: wound vac to R LE Required Braces or Orthoses: Other Brace Other Brace: R LE limb protector, L LE prosthesis Restrictions Weight Bearing Restrictions Per Provider Order: Yes RLE Weight Bearing Per Provider Order: Weight bearing as tolerated    Therapy/Group: Individual Therapy  Ruthanna Covert, MS, OTR/L  12/30/2023, 2:03 PM

## 2023-12-30 NOTE — Progress Notes (Signed)
 Occupational Therapy Session Note  Patient Details  Name: Damacio Weisgerber MRN: [id number] Date of Birth: [birth year]  Today's Date: 12/30/2023 OT Individual Time: 4098-1191 OT Individual Time Calculation (min): 73 min    Short Term Goals: Week 1:  OT Short Term Goal 1 (Week 1): Pt will complete toilet transfer with min A using LRAD OT Short Term Goal 2 (Week 1): Pt will complete 2/3 toileting steps with CGA for balance OT Short Term Goal 3 (Week 1): Pt will complete sit > stand in prep for ADL with max A  Skilled Therapeutic Interventions/Progress Updates: Patient received resting in bed. Agreeable to OT treatment. Patient assisted with ADL's at bed level. Patient Needing only set up assist for bathing and Min assist for bed level dressing, mainly due to wound vac. Patient reporting feeling fatigued from previous days events and requested to continue treatment with bed level exercises. Patient given 3lb hand wt for UE strength and endurance exercises working especially on triceps and deltoids. Patient tolerated 2 x 10 in varied planes without complaint. Continue skilled OT POC to improve patient's independence with self care mobility and functional endurance.     Therapy Documentation Precautions:  Precautions Precautions: Fall Precaution/Restrictions Comments: wound vac to R LE Required Braces or Orthoses: Other Brace Other Brace: R LE limb protector, L LE prosthesis Restrictions Weight Bearing Restrictions Per Provider Order: Yes RLE Weight Bearing Per Provider Order: Weight bearing as tolerated General:   Vital Signs: Therapy Vitals Temp: 98 F (36.7 C) Pulse Rate: 67 Resp: 16 BP: 110/67 Patient Position (if appropriate): Lying Oxygen Therapy SpO2: 97 % O2 Device: Room Air Pain: Pain Assessment Pain Scale: 0-10 Pain Score: 2  Pain Location: Leg Pain Intervention(s): Medication (See eMAR);Repositioned ADL: ADL Grooming: Setup Where Assessed-Grooming: Edge of bed Upper  Body Bathing: Setup Where Assessed-Upper Body Bathing: Edge of bed Lower Body Bathing: Minimal assistance Where Assessed-Lower Body Bathing: Edge of bed, Bed level Upper Body Dressing: Setup Where Assessed-Upper Body Dressing: Edge of bed Lower Body Dressing: Minimal assistance Where Assessed-Lower Body Dressing: Edge of bed, Bed level    Therapy/Group: Individual Therapy  Marty Sleet 12/30/2023, 12:48 PM

## 2023-12-31 DIAGNOSIS — Z89511 Acquired absence of right leg below knee: Secondary | ICD-10-CM | POA: Diagnosis not present

## 2023-12-31 DIAGNOSIS — E1142 Type 2 diabetes mellitus with diabetic polyneuropathy: Secondary | ICD-10-CM | POA: Diagnosis not present

## 2023-12-31 DIAGNOSIS — M869 Osteomyelitis, unspecified: Secondary | ICD-10-CM | POA: Diagnosis not present

## 2023-12-31 DIAGNOSIS — D62 Acute posthemorrhagic anemia: Secondary | ICD-10-CM | POA: Diagnosis not present

## 2023-12-31 LAB — GLUCOSE, CAPILLARY
Glucose-Capillary: 102 mg/dL — ABNORMAL HIGH (ref 70–99)
Glucose-Capillary: 100 mg/dL — ABNORMAL HIGH (ref 70–99)
Glucose-Capillary: 118 mg/dL — ABNORMAL HIGH (ref 70–99)
Glucose-Capillary: 80 mg/dL (ref 70–99)

## 2023-12-31 MED ORDER — TRAMADOL HCL 50 MG PO TABS
50.0000 mg | ORAL_TABLET | Freq: Four times a day (QID) | ORAL | Status: DC | PRN
  Administered 2023-12-31: 50 mg via ORAL
  Filled 2023-12-31: qty 1

## 2023-12-31 NOTE — Progress Notes (Signed)
 PT stated he is not wanting medication without a reason, refused routine tramadol  at 1800 and midnight. Nurse educated on long acting insulin , pt questioning dose which has decreased from home dose. Pt participating in I&o tracking. Nurse encouraged fluids, urine amber in color.

## 2023-12-31 NOTE — Progress Notes (Signed)
 PROGRESS NOTE   Subjective/Complaints: Pt without new complaints. Per nursing notes he has refused some scheduled meds? Did not mention any concerns to me today.   ROS: Patient denies fever, rash, sore throat, blurred vision, dizziness, nausea, vomiting, diarrhea, cough, shortness of breath or chest pain, joint or back/neck pain, headache, or mood change.   Objective:   No results found. No results for input(s): WBC, HGB, HCT, PLT in the last 72 hours.  No results for input(s): NA, K, CL, CO2, GLUCOSE, BUN, CREATININE, CALCIUM  in the last 72 hours.   Intake/Output Summary (Last 24 hours) at 12/31/2023 0959 Last data filed at 12/31/2023 0900 Gross per 24 hour  Intake 720 ml  Output 1425 ml  Net -705 ml        Physical Exam: Vital Signs Blood pressure (!) 116/58, pulse (!) 59, temperature 98 F (36.7 C), temperature source Oral, resp. rate 18, height 4' 8.6 (1.438 m), weight 82.3 kg, SpO2 98%.  Constitutional: No distress . Vital signs reviewed. HEENT: NCAT, EOMI, oral membranes moist Neck: supple Cardiovascular: RRR without murmur. No JVD    Respiratory/Chest: CTA Bilaterally without wheezes or rales. Normal effort    GI/Abdomen: BS +, non-tender, non-distended Ext: no clubbing, cyanosis, or edema Psych: pleasant and cooperative  Skin: Clean and intact without signs of breakdown. Prevena vac in place with suction and no drainage in cannister. . Right BK well shaped with shrinker in place Neuro:   Mental Status: AAOx3, memory intact Speech/Languate:  fluent, follows simple commands CRANIAL NERVES: 2-12 grossly intact MMT: 5/5 throughout except for R BKA   SENSORY: Normal to touch all 4 extremities Prior neuro assessment is c/w today's exam 12/31/2023.    Assessment/Plan: 1. Functional deficits which require 3+ hours per day of interdisciplinary therapy in a comprehensive inpatient rehab  setting. Physiatrist is providing close team supervision and 24 hour management of active medical problems listed below. Physiatrist and rehab team continue to assess barriers to discharge/monitor patient progress toward functional and medical goals  Care Tool:  Bathing    Body parts bathed by patient: Right arm, Left arm, Chest, Abdomen, Front perineal area, Right upper leg, Left upper leg, Face   Body parts bathed by helper: Buttocks Body parts n/a: Right lower leg, Left lower leg   Bathing assist Assist Level: Minimal Assistance - Patient > 75%     Upper Body Dressing/Undressing Upper body dressing   What is the patient wearing?: Pull over shirt    Upper body assist Assist Level: Set up assist    Lower Body Dressing/Undressing Lower body dressing      What is the patient wearing?: Pants     Lower body assist Assist for lower body dressing: Minimal Assistance - Patient > 75%     Toileting Toileting    Toileting assist Assist for toileting: Moderate Assistance - Patient 50 - 74%     Transfers Chair/bed transfer  Transfers assist  Chair/bed transfer activity did not occur: Safety/medical concerns  Chair/bed transfer assist level: Supervision/Verbal cueing (lateral scoot)     Locomotion Ambulation   Ambulation assist   Ambulation activity did not occur: Safety/medical concerns (fear, weakness, decreased balance)  Walk 10 feet activity   Assist  Walk 10 feet activity did not occur: Safety/medical concerns (fear, weakness, decreased balance)        Walk 50 feet activity   Assist Walk 50 feet with 2 turns activity did not occur: Safety/medical concerns (fear, weakness, decreased balance)         Walk 150 feet activity   Assist Walk 150 feet activity did not occur: Safety/medical concerns (fear, weakness, decreased balance)         Walk 10 feet on uneven surface  activity   Assist Walk 10 feet on uneven surfaces activity did  not occur: Safety/medical concerns (fear, weakness, decreased balance)         Wheelchair     Assist Is the patient using a wheelchair?: Yes Type of Wheelchair: Manual Wheelchair activity did not occur: Safety/medical concerns (weakness)         Wheelchair 50 feet with 2 turns activity    Assist    Wheelchair 50 feet with 2 turns activity did not occur: Safety/medical concerns (weakness)       Wheelchair 150 feet activity     Assist  Wheelchair 150 feet activity did not occur: Safety/medical concerns (weakness)       Blood pressure (!) 116/58, pulse (!) 59, temperature 98 F (36.7 C), temperature source Oral, resp. rate 18, height 4' 8.6 (1.438 m), weight 82.3 kg, SpO2 98%.   Medical Problem List and Plan: 1. 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  Wound VAC as directed             -patient may not shower due to wound vac             -ELOS/Goals: 7 days, Mod I to Sup with PT/OT            Grounds pass ordered  -Continue CIR therapies including PT, OT    2.  Antithrombotics: -DVT/anticoagulation:  Pharmaceutical: continue Lovenox              -antiplatelet therapy: Plavix  75 mg daily  3. Pain Management: Tramadol  50 mg every 6 hours  6/15 can change tramadol  to prn per pt's comments to RN 4. Mood/Behavior/Sleep: Melatonin 3 mg nightly.  Provide emotional support             -antipsychotic agents: N/A 5. Neuropsych/cognition: This patient is capable of making decisions on his own behalf. 6. Wound vac: messaged Dr. Julio Ohm to see if this can be removed  6/15 Pt has had wound vac for 11 days. Directions were to continue until discharge from the hospital.  -remove today, continue shrinker and dry dressing if needed 7. Fluids/Electrolytes/Nutrition: Routine in and outs with follow-up chemistries 8.  Acute blood loss anemia.  Follow-up CBC. HGB 9.6 on 6/9  -6/10 HGB stable 10.0 , monitor 9.  History of  left BKA 2019.  Patient did receive CIR.  Patient does have a prosthesis.  Independent prior to admission with prosthesis 10.  Diabetes mellitus with peripheral neuropathy.  Hemoglobin A1c 12.4.  Decrease semglee  to 19U HS  CBG (last 3)  Recent Labs    12/30/23 1614 12/30/23 2028 12/31/23 0542  GLUCAP 153* 136* 102*  Off SSI  6/15 cbg's remain reasonably controll 11.  CKD stage II.  Follow-up chemistries. Cr 1.03 on 6/10, stable continue to monitor 12.  History of CVA.  Continue Plavix  13. Constipation            Pt  had bm 6/13    LOS: 6 days A FACE TO FACE EVALUATION WAS PERFORMED  Rawland Caddy 12/31/2023, 9:59 AM

## 2023-12-31 NOTE — Plan of Care (Signed)
  Problem: Consults Goal: RH LIMB LOSS PATIENT EDUCATION Description: Description: See Patient Education module for eduction specifics. Outcome: Progressing   Problem: RH BOWEL ELIMINATION Goal: RH STG MANAGE BOWEL WITH ASSISTANCE Description: STG Manage Bowel with toileting Assistance. Outcome: Progressing Goal: RH STG MANAGE BOWEL W/MEDICATION W/ASSISTANCE Description: STG Manage Bowel with Medication with mod I  Assistance. Outcome: Progressing   Problem: RH SAFETY Goal: RH STG ADHERE TO SAFETY PRECAUTIONS W/ASSISTANCE/DEVICE Description: STG Adhere to Safety Precautions With  cues Assistance/Device. Outcome: Progressing   Problem: RH PAIN MANAGEMENT Goal: RH STG PAIN MANAGED AT OR BELOW PT'S PAIN GOAL Description: Pain < 4 with prns Outcome: Progressing   Problem: RH KNOWLEDGE DEFICIT LIMB LOSS Goal: RH STG INCREASE KNOWLEDGE OF SELF CARE AFTER LIMB LOSS Description: Patient and spouse will be able to manage care at discharge using educational resources for medication, skin care and dietary modification independently Outcome: Progressing   Problem: RH KNOWLEDGE DEFICIT Goal: RH STG INCREASE KNOWLEDGE OF DIABETES Description: Patient and spouse will be able to manage DM at discharge using educational resources for medication, skin care and dietary modification independently Outcome: Progressing Goal: RH STG INCREASE KNOWLEDGE OF HYPERTENSION Description: Patient and spouse will be able to manage HTN at discharge using educational resources for medication, skin care and dietary modification independently Outcome: Progressing

## 2023-12-31 NOTE — Plan of Care (Signed)
  Problem: Consults Goal: RH LIMB LOSS PATIENT EDUCATION Description: Description: See Patient Education module for eduction specifics. Outcome: Progressing   Problem: RH BOWEL ELIMINATION Goal: RH STG MANAGE BOWEL WITH ASSISTANCE Description: STG Manage Bowel with toileting Assistance. Outcome: Progressing Goal: RH STG MANAGE BOWEL W/MEDICATION W/ASSISTANCE Description: STG Manage Bowel with Medication with mod I  Assistance. Outcome: Progressing   Problem: RH SAFETY Goal: RH STG ADHERE TO SAFETY PRECAUTIONS W/ASSISTANCE/DEVICE Description: STG Adhere to Safety Precautions With  cues Assistance/Device. Outcome: Progressing   Problem: RH PAIN MANAGEMENT Goal: RH STG PAIN MANAGED AT OR BELOW PT'S PAIN GOAL Description: Pain < 4 with prns Outcome: Progressing   Problem: RH KNOWLEDGE DEFICIT LIMB LOSS Goal: RH STG INCREASE KNOWLEDGE OF SELF CARE AFTER LIMB LOSS Description: Patient and spouse will be able to manage care at discharge using educational resources for medication, skin care and dietary modification independently Outcome: Progressing

## 2024-01-01 DIAGNOSIS — Z89511 Acquired absence of right leg below knee: Secondary | ICD-10-CM | POA: Diagnosis not present

## 2024-01-01 LAB — GLUCOSE, CAPILLARY
Glucose-Capillary: 86 mg/dL (ref 70–99)
Glucose-Capillary: 154 mg/dL — ABNORMAL HIGH (ref 70–99)
Glucose-Capillary: 171 mg/dL — ABNORMAL HIGH (ref 70–99)
Glucose-Capillary: 179 mg/dL — ABNORMAL HIGH (ref 70–99)

## 2024-01-01 LAB — CREATININE, SERUM
Creatinine, Ser: 1.11 mg/dL (ref 0.61–1.24)
GFR, Estimated: >60 mL/min (ref >60)

## 2024-01-01 NOTE — Progress Notes (Signed)
 Occupational Therapy Weekly Progress Note  Patient Details  Name: Kevin Bradford MRN: [id number] Date of Birth: [birth year]  Beginning of progress report period: December 26, 2023 End of progress report period: January 01, 2024  Patient has met 3 of 3 short term goals. Pt is making gradual progress towards LTGs. He is able to bathe with min A, UB dress with set up A, LB dress with min A and completes toileting with min A. In the past week, pt has been mostly limited by constipation and fatigue. In addition, pt continues to demonstrate functional endurance, dynamic sitting/standing balance, and gross motor coordination deficits resulting in difficulty completing BADL tasks without increased physical assist. Pt will benefit from continued skilled OT services to focus on mentioned deficits. Family ed not initiated as of date and will be needed in prep for DC.   Patient continues to demonstrate the following deficits: muscle weakness, decreased cardiorespiratoy endurance, decreased coordination, decreased problem solving, and decreased sitting balance and decreased standing balance and therefore will continue to benefit from skilled OT intervention to enhance overall performance with BADL and Reduce care partner burden.  Patient progressing toward long term goals..  Continue plan of care.  OT Short Term Goals Week 1:  OT Short Term Goal 1 (Week 1): Pt will complete toilet transfer with min A using LRAD OT Short Term Goal 1 - Progress (Week 1): Met OT Short Term Goal 2 (Week 1): Pt will complete 2/3 toileting steps with CGA for balance OT Short Term Goal 2 - Progress (Week 1): Met OT Short Term Goal 3 (Week 1): Pt will complete sit > stand in prep for ADL with max A OT Short Term Goal 3 - Progress (Week 1): Met Week 2:  OT Short Term Goal 1 (Week 2): Pt will recall correct steps in prep for lateral scoot transfer with supervision OT Short Term Goal 2 (Week 2): Pt will donn R BKA shrinker with  supervision    Ruthanna Covert, MS, OTR/L  01/01/2024, 7:42 AM

## 2024-01-01 NOTE — Progress Notes (Signed)
 Occupational Therapy Session Note  Patient Details  Name: Kevin Bradford MRN: [id number] Date of Birth: 12-20-1945  Today's Date: 01/01/2024 OT Individual Time: 6962-9528 & 4132-4401 OT Individual Time Calculation (min): 57 min & 70 min   Short Term Goals: Week 1:  OT Short Term Goal 1 (Week 1): Pt will complete toilet transfer with min A using LRAD OT Short Term Goal 2 (Week 1): Pt will complete 2/3 toileting steps with CGA for balance OT Short Term Goal 3 (Week 1): Pt will complete sit > stand in prep for ADL with max A  Skilled Therapeutic Interventions/Progress Updates:  Session 1 Skilled OT intervention completed with focus on functional transfers, sit > stands. Pt received seated EOB with nursing issuing meds. Pt agreeable to session. 1/10 pain reported in R BKA. OT offered rest breaks, repositioning throughout for pain reduction.  Donned LLE prosthesis independently, R limb guard already donned. CGA lateral scoot > w/c with cues to avoid stopping halfway on wheel but no correction. Self-propelled in w/c <> gym with supervision.  Required min/mod cues for correct sequencing of positioning w/c and prepping for transfer. Min A needed for leg rest management. CGA lateral scoot > EOM with again pt sitting on wheel halfway despite cues.   Completed x5 sit <> stands in stedy from mat at lowest position with CGA/min A. Pt relies on both elbows on stedy flaps, and pulling to power up, but once in stance, able to achieve full hip extension and CGA for balance in stance. Able to lift LUE from grab bar, however pt declined completing an activity in stance to address dynamic standing balance.   Pt easily fatigued this session, with frequent refusal to continue activities when winded or exerted. Rest breaks offered however pt slightly self-limiting himself. Pt declined further standing, therefore transitioned to seated activity. Attempted to have pt use push blocks to trial clearing his buttocks  however pt declined requesting to use his fists. Able to do so with full clearance with supervision x10, however when asked to do an additional set, pt declined. Education provided on purpose of task to translate over to lateral scoots and effectively clearing his buttocks over the wheel. Pt responded that he intentionally sits on the w/c wheel because it is halfway. Discussed why this is not safe or ideal as if he forgets to lock brake the chair could take off underneath him, and to work towards intentionally making the halfway point either the w/c or the surface he is transferring from. CGA lateral scoot with cues provided when to complete big lift over wheel, and pt able to do without increased assist.  Back in room, similar transfer > EOB. Doffed LLE prosthesis independently, mod I sit > supine transition. Pt remained semi supine in bed, with bed alarm on/activated, and with all needs in reach at end of session.  Session 2 Skilled OT intervention completed with focus on ADL retraining, functional endurance, and mobility within a shower context. Pt received upright in bed, agreeable to session. No pain reported. Pt agreeable to shower  Pt completed all CGA lateral scoots throughout session with min verbal cues for sequencing, body positioning and hand placement.  Transitioned upright in bed > sitting EOB with mod I with heavy reliance on bed rails. Donned LLE prosthesis independently. Education provided to wife on lateral scoots at CGA/supervision level. Wife assisted with mod verbal cues, from EOB > w/c at [facility] level. OT assisted padded TTB transfer.   Waterproof cover applied to R  BKA prior to shower. Doffed prosthesis independently. Pt was able to bathe all parts with intermittent supervision, at the seated level only utilizing lateral leans for periareas. Education provided on donning LLE prosthesis for efficiency with transfer. Suggested use of towel under buttocks to reduce friction with  scooting however pt refused. Scoot > w/c with min A, then min A again > EOB due to buttocks shearing on w/c and pt fatigue. Able to donn shirt/deo with set up A. Threaded LB clothing with supervision using lateral leans to donn vs bed level. Mod cues needed, but pt able to donn R 3X shrinker with supervision.  Discussed home environmental limitations including inability to access stairs for full bath therefore sponge bathing plan for home. Educated on option of hair washing tray for at sink. Discussed pt's options for dressing. Discussed use of DABSC for lateral scoot transfers. Did suggest a bariatric DABSC could be useful considering his bilateral amputee status for A/P transfers to minimize need for donning the prosthesis in prep for urgent toilet transfer, however pt very demeaning and not receptive to this idea.  Pt remained upright in bed, with bed alarm on/activated, and with all needs in reach at end of session.   Therapy Documentation Precautions:  Precautions Precautions: Fall Required Braces or Orthoses: Other Brace Other Brace: R LE limb protector, L LE prosthesis Restrictions Weight Bearing Restrictions Per Provider Order: Yes RLE Weight Bearing Per Provider Order: Non weight bearing    Therapy/Group: Individual Therapy  Ruthanna Covert, MS, OTR/L  01/01/2024, 2:30 PM

## 2024-01-01 NOTE — Progress Notes (Signed)
 Physical Therapy Session Note  Patient Details  Name: Kevin Bradford MRN: [id number] Date of Birth: 1945/08/15  Today's Date: 01/01/2024 PT Individual Time: 1504-1600 PT Individual Time Calculation (min): 56 min   Short Term Goals: Week 1:  PT Short Term Goal 1 (Week 1): pt will perform bed mobility with CGA overall PT Short Term Goal 2 (Week 1): pt will transfer bed<>chair with LRAD and CGA PT Short Term Goal 3 (Week 1): pt will transfer sit<>stand with LRAD and min A  Skilled Therapeutic Interventions/Progress Updates:     Pt received supine in bed and agrees to therapy. Reports pain in Rt residual limb. PT provides rest breaks as needed to manage pain. Pt performs supine to sit with modA with cues for logrolling and hand placement, as well as positioning at EOB. PT assists to don Rt limb protectors and setup assistance for LLE prosthesis. Pt performs lateral scoot transfer from bed to [facility] with cues for body mechanics and hand placement. WC transport to gym. Pt performs lateral transfer to mat with CGA and education on head hips relationship. Pt tends to keep weight too far posteriorly so PT educates on importance of anterior trunk lean and leverage to assist with mobility. PT encourages pt to attempt sit to stand transfer with RW and pt is hesitant due to anxiety of falling and weakness in LLE. PT provides encouragement and pot is eventually agreeable. Pt attempts and able to achieve ~75% of stand before requesting to return to seated position. Pt limited by anxiety. PT adjusts activity to accommodate pt's comfort level. Platform placed between legs and pt performs 3x5 sit to squat transitions, cued to place both hands on platform in front of him and use anterior trunk lean to unweight buttocks and attempt to clear it from mat. Following, pt performs squat pivot from mat>WC>bed with modA to facilitate lateral and anterior weight shift for improved efficiency of transfer. Pt left supine with alarm  intact and all needs within reach.   Therapy Documentation Precautions:  Precautions Precautions: Fall Precaution/Restrictions Comments: wound vac to R LE Required Braces or Orthoses: Other Brace Other Brace: R LE limb protector, L LE prosthesis Restrictions Weight Bearing Restrictions Per Provider Order: Yes RLE Weight Bearing Per Provider Order: Non weight bearing   Therapy/Group: Individual Therapy  Neva Barban, PT, DPT 01/01/2024, 5:00 PM

## 2024-01-01 NOTE — Progress Notes (Addendum)
 PROGRESS NOTE   Subjective/Complaints: Having lunch prior to PT , no stump pain   ROS: Patient denies CP, SOB, + constipation last BM 3 d ago  Objective:   No results found. No results for input(s): WBC, HGB, HCT, PLT in the last 72 hours.  Recent Labs    01/01/24 0509  CREATININE 1.11     Intake/Output Summary (Last 24 hours) at 01/01/2024 1246 Last data filed at 01/01/2024 0755 Gross per 24 hour  Intake 476 ml  Output 900 ml  Net -424 ml        Physical Exam: Vital Signs Blood pressure 113/65, pulse (!) 57, temperature 97.7 F (36.5 C), temperature source Oral, resp. rate 18, height 4' 8.6 (1.438 m), weight 82.3 kg, SpO2 99%.  Constitutional: No distress . Vital signs reviewed.  General: No acute distress Mood and affect are appropriate Heart: Regular rate and rhythm no rubs murmurs or extra sounds Lungs: Clear to auscultation, breathing unlabored, no rales or wheezes Abdomen: Positive bowel sounds, soft nontender to palpation, nondistended Extremities: No clubbing, cyanosis, or edema Skin: No evidence of breakdown, no evidence of rash RIght BKA looks great, no drainage or erythema staples intact  Neuro:   Mental Status: AAOx3, memory intact Speech/Languate:  fluent, follows simple commands CRANIAL NERVES: 2-12 grossly intact MMT: 5/5 throughout except for R BKA   SENSORY: Normal to touch all 4 extremities Prior neuro assessment is c/w today's exam 01/01/2024.    Assessment/Plan: 1. Functional deficits which require 3+ hours per day of interdisciplinary therapy in a comprehensive inpatient rehab setting. Physiatrist is providing close team supervision and 24 hour management of active medical problems listed below. Physiatrist and rehab team continue to assess barriers to discharge/monitor patient progress toward functional and medical goals  Care Tool:  Bathing    Body parts bathed by  patient: Right arm, Left arm, Chest, Abdomen, Front perineal area, Right upper leg, Left upper leg, Face   Body parts bathed by helper: Buttocks Body parts n/a: Right lower leg, Left lower leg   Bathing assist Assist Level: Minimal Assistance - Patient > 75%     Upper Body Dressing/Undressing Upper body dressing   What is the patient wearing?: Pull over shirt    Upper body assist Assist Level: Set up assist    Lower Body Dressing/Undressing Lower body dressing      What is the patient wearing?: Pants     Lower body assist Assist for lower body dressing: Minimal Assistance - Patient > 75%     Toileting Toileting    Toileting assist Assist for toileting: Moderate Assistance - Patient 50 - 74%     Transfers Chair/bed transfer  Transfers assist  Chair/bed transfer activity did not occur: Safety/medical concerns  Chair/bed transfer assist level: Supervision/Verbal cueing (lateral scoot)     Locomotion Ambulation   Ambulation assist   Ambulation activity did not occur: Safety/medical concerns (fear, weakness, decreased balance)          Walk 10 feet activity   Assist  Walk 10 feet activity did not occur: Safety/medical concerns (fear, weakness, decreased balance)        Walk 50 feet activity  Assist Walk 50 feet with 2 turns activity did not occur: Safety/medical concerns (fear, weakness, decreased balance)         Walk 150 feet activity   Assist Walk 150 feet activity did not occur: Safety/medical concerns (fear, weakness, decreased balance)         Walk 10 feet on uneven surface  activity   Assist Walk 10 feet on uneven surfaces activity did not occur: Safety/medical concerns (fear, weakness, decreased balance)         Wheelchair     Assist Is the patient using a wheelchair?: Yes Type of Wheelchair: Manual Wheelchair activity did not occur: Safety/medical concerns (weakness)         Wheelchair 50 feet with 2 turns  activity    Assist    Wheelchair 50 feet with 2 turns activity did not occur: Safety/medical concerns (weakness)       Wheelchair 150 feet activity     Assist  Wheelchair 150 feet activity did not occur: Safety/medical concerns (weakness)       Blood pressure 113/65, pulse (!) 57, temperature 97.7 F (36.5 C), temperature source Oral, resp. rate 18, height 4' 8.6 (1.438 m), weight 82.3 kg, SpO2 99%.   Medical Problem List and Plan: 1. 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  Wound VAC as directed             -patient may not shower due to wound vac             -ELOS/Goals: 7 days, Mod I to Sup with PT/OT            Grounds pass ordered  -Continue CIR therapies including PT, OT    2.  Antithrombotics: -DVT/anticoagulation:  Pharmaceutical: continue Lovenox              -antiplatelet therapy: Plavix  75 mg daily  3. Pain Management: Tramadol  50 mg every 6 hours  6/15 can change tramadol  to prn per pt's comments to RN 4. Mood/Behavior/Sleep: Melatonin 3 mg nightly.  Provide emotional support             -antipsychotic agents: N/A 5. Neuropsych/cognition: This patient is capable of making decisions on his own behalf. 6. Wound vac: messaged Dr. Julio Ohm to see if this can be removed  6/15 Pt has had wound vac for 11 days. Directions were to continue until discharge from the hospital.  -remove today, continue shrinker and dry dressing if needed 7. Fluids/Electrolytes/Nutrition: Routine in and outs with follow-up chemistries 8.  Acute blood loss anemia.  Follow-up CBC. HGB 9.6 on 6/9  -6/10 HGB stable 10.0 , monitor 9.  History of left BKA 2019.  Patient did receive CIR.  Patient does have a prosthesis.  Independent prior to admission with prosthesis 10.  Diabetes mellitus with peripheral neuropathy.  Hemoglobin A1c 12.4.  Decrease semglee  to 19U HS  CBG (last 3)  Recent Labs    12/31/23 2046 01/01/24 0603 01/01/24 1116   GLUCAP 80 86 154*  Off SSI  6/15 cbg's remain reasonably controll 11.  CKD stage II.  Follow-up chemistries. Cr 1.03 on 6/10, stable continue to monitor 12.  History of CVA.  Continue Plavix  13. Constipation            Pt had bm 6/13    LOS: 7 days A FACE TO FACE EVALUATION WAS PERFORMED  Kevin Bradford 01/01/2024, 12:46 PM

## 2024-01-02 ENCOUNTER — Ambulatory Visit: Admitting: Orthopedic Surgery

## 2024-01-02 DIAGNOSIS — K5900 Constipation, unspecified: Secondary | ICD-10-CM | POA: Diagnosis not present

## 2024-01-02 DIAGNOSIS — Z89511 Acquired absence of right leg below knee: Secondary | ICD-10-CM | POA: Diagnosis not present

## 2024-01-02 DIAGNOSIS — E1142 Type 2 diabetes mellitus with diabetic polyneuropathy: Secondary | ICD-10-CM | POA: Diagnosis not present

## 2024-01-02 DIAGNOSIS — G4700 Insomnia, unspecified: Secondary | ICD-10-CM | POA: Diagnosis not present

## 2024-01-02 LAB — GLUCOSE, CAPILLARY
Glucose-Capillary: 165 mg/dL — ABNORMAL HIGH (ref 70–99)
Glucose-Capillary: 135 mg/dL — ABNORMAL HIGH (ref 70–99)
Glucose-Capillary: 252 mg/dL — ABNORMAL HIGH (ref 70–99)
Glucose-Capillary: 234 mg/dL — ABNORMAL HIGH (ref 70–99)

## 2024-01-02 MED ORDER — MELATONIN 5 MG PO TABS
5.0000 mg | ORAL_TABLET | Freq: Every day | ORAL | Status: DC
  Administered 2024-01-02: 5 mg via ORAL
  Filled 2024-01-02: qty 1

## 2024-01-02 MED ORDER — SORBITOL 70 % SOLN
60.0000 mL | Freq: Once | Status: AC
  Administered 2024-01-02: 60 mL via ORAL
  Filled 2024-01-02: qty 60

## 2024-01-02 NOTE — Progress Notes (Signed)
 Physical Therapy Session Note  Patient Details  Name: Kevin Bradford MRN: [id number] Date of Birth: 1946/02/27  Today's Date: 01/02/2024 PT Individual Time: 1400-1459 PT Individual Time Calculation (min): 59 min   Short Term Goals: Week 1:  PT Short Term Goal 1 (Week 1): pt will perform bed mobility with CGA overall PT Short Term Goal 2 (Week 1): pt will transfer bed<>chair with LRAD and CGA PT Short Term Goal 3 (Week 1): pt will transfer sit<>stand with LRAD and min A  Skilled Therapeutic Interventions/Progress Updates:   Received pt sitting EOB with wife at bedside. Pt agreeable to PT treatment and reported pain in residual limb was well managed. Pt reported having a rough night having sleep disturbances, disoriented, and hallucinating and when waking up, 4th bedrail was down and pt had anxiety attack thinking he was going to fall off the bed. Discussed that typically only 3 rails are up as 4th rail considered a restraint but wrote note on safety plan that pt prefers 4 rails up at night for safety. Session with emphasis on functional mobility/transfers, generalized strengthening and endurance, and dynamic standing balance/coordination. Pt able to direct wife in transfer set up and WC positioning (therapist cued pt's wife for proper body positioning) and pt performed lateral scoot into WC with close supervision and donned legrests with supervision and increased time.   Pt performed WC mobility 188ft using BUE and supervision with emphasis on UE strength and coordination to dayroom. Pt/wife able to set up transfer to mat with 1 cue to remove legrests completely prior to transferring. Performed lateral scoot on/off mat with close supervision with wife stabilizing WC and with increased time and maximal encouragement, pt agreed to attempt to stand. Pt fearful of falling, remembering his last fall with RW, but agreed to attempt. Pt stood from elevated EOM x 3 trials (2/3 with mod A and final stand with  max A) with pt's wife stabilizing RW. Pt prefers to stand pushing with BUE support on RW - discussed recommendation to avoid standing at home unless with therapy (pt/wife in agreement and verbalized understanding).   Pt then performed the following exercises with emphasis on UE/core strength: -bicep curls with 6lb dumbbell 2x10 bilaterally -horizontal chest press with 6lb dumbbell 2x8 -lateral trunk rotations with 6lb medicine ball 2x10 bilaterally  -shoulder extensions into phyioball 2x10 with 5 second hold  Returned to room and pt's wife assisted with lateral scoot transfer back to bed (supervision). Pt removed prosthetic and transferred into supine with supervision. Concluded session with pt semi-reclined in bed, needs within reach, bed alarm on, and all 4 rails up per pt request. Cleared wife to assist with bed<>chair transfers and safety plan updated.   Therapy Documentation Precautions:  Precautions Precautions: Fall Precaution/Restrictions Comments: wound vac to R LE Required Braces or Orthoses: Other Brace Other Brace: R LE limb protector, L LE prosthesis Restrictions Weight Bearing Restrictions Per Provider Order: Yes RLE Weight Bearing Per Provider Order: Weight bearing as tolerated  Therapy/Group: Individual Therapy [name] [name] PT, DPT 01/02/2024, 6:49 AM

## 2024-01-02 NOTE — Plan of Care (Signed)
  Problem: RH Ambulation Goal: LTG Patient will ambulate in controlled environment (PT) Description: LTG: Patient will ambulate in a controlled environment, # of feet with assistance (PT). Outcome: Not Applicable Flowsheets (Taken 01/02/2024 0720) LTG: Pt will ambulate in controlled environ  assist needed:: (D/C) -- Note: D/C

## 2024-01-02 NOTE — Progress Notes (Signed)
 Occupational Therapy Session Note  Patient Details  Name: Kevin Bradford MRN: [id number] Date of Birth: 1945-10-20  Today's Date: 01/02/2024 OT Individual Time: 1120-1203 OT Individual Time Calculation (min): 43 min    Short Term Goals: Week 2:  OT Short Term Goal 1 (Week 2): Pt will recall correct steps in prep for lateral scoot transfer with supervision OT Short Term Goal 2 (Week 2): Pt will donn R BKA shrinker with supervision  Skilled Therapeutic Interventions/Progress Updates:    Pt received supine with 2/10 pain in his R residual limb and agreeable to session. Wife present and supportive during session. He came to EOB with (S) and completed a lateral scoot to the w/c with CGA. He had limb guard already on. He completed grooming tasks at the sink with set up assist. He propelled the w/c 50 ft with (S) but fatigued quickly. He was brought into the parallel bars. He worked on static standing on L prosthetic and sequence of pushing through BUE with min A and mod cueing for technique. In standing he completed RLE hip abduction, extension, and flexion 3x10 repetitions to strengthen glutes and hip flexors for prosthetic prep that will facilitate more independence with transfers. Pt required use of rest breaks throughout session for recovery, as well as to support safety and prevent overexertion. During breaks, OT monitored recovery time to assess endurance and response to exertion. Pt returned to their room following. He transferred back to bed with lateral scoot- CGA. He was left supine with all needs met.    Therapy Documentation Precautions:  Precautions Precautions: Fall Precaution/Restrictions Comments: wound vac to R LE Required Braces or Orthoses: Other Brace Other Brace: R LE limb protector, L LE prosthesis Restrictions Weight Bearing Restrictions Per Provider Order: Yes RLE Weight Bearing Per Provider Order: Weight bearing as tolerated  Therapy/Group: Individual Therapy  Una Ganser 01/02/2024, 6:26 AM

## 2024-01-02 NOTE — Progress Notes (Signed)
 Occupational Therapy Session Note  Patient Details  Name: Kevin Bradford MRN: [id number] Date of Birth: 1946/02/09  Today's Date: 01/02/2024 OT Individual Time: 4098-1191 OT Individual Time Calculation (min): 75 min    Short Term Goals: Week 2:  OT Short Term Goal 1 (Week 2): Pt will recall correct steps in prep for lateral scoot transfer with supervision OT Short Term Goal 2 (Week 2): Pt will donn R BKA shrinker with supervision  Skilled Therapeutic Interventions/Progress Updates:  Skilled OT intervention completed with focus on activity tolerance, functional transfers, emotional support, core strengthening. Pt received upright in bed, initially declining session stating I want to forgo my day. No pain reported.  Pt reported a poor night of sleep. Also reported waking in the middle of the night to only 3 bed rails up- stating if I'm a fall risk, don't you think it's important to have all 4 up so I don't roll off? Educated pt on hospital policy of only 3 rails, otherwise considered a restraint unless specifically requested. Educated that pt may need to consider getting a bed rail for home if he is concerned about rolling off however pt stated I won't have that problem there.  With gentle encouragement, pt agreeable to session and to get OOB. Completed bed mobility with supervision using bed rails. R limb guard already donned. Donned L prosthesis independently. Completed CGA lateral scoot > w/c. Pt became emotional regarding a series of things including his love for his wife, his disappointment with support of his family/children, and feelings of being worthless to society since over the age of 66. Emotional support offered and we discussed goals for rehab to reduce his feelings of being a burden on his wife. Discussed rationale for w/c level recommendation at DC due to inability to progress to standing with RW.  Pt self-propelled in w/c <> gym with mod I. Able to recall correct order of  steps for positioning w/c and prepping for w/c with only min cues this session. No assist to manage all parts. CGA/supervision lateral scoot > EOM. Seated EOM, pt completed the following core exercises to promote stability and balance needed for BADLs: (4.4 lb), 2x15 -modified crunches -modified russian twists -modified decline chest press  CGA lateral scoot > w/c > EOB. Remained sitting EOB with MD present at direct care handoff.    Therapy Documentation Precautions:  Precautions Precautions: Fall Required Braces or Orthoses: Other Brace Other Brace: R LE limb protector, L LE prosthesis Restrictions Weight Bearing Restrictions Per Provider Order: Yes RLE Weight Bearing Per Provider Order: Weight bearing as tolerated    Therapy/Group: Individual Therapy  Ruthanna Covert, MS, OTR/L  01/02/2024, 12:32 PM

## 2024-01-02 NOTE — Progress Notes (Signed)
 Physical Therapy Session Note  Patient Details  Name: Kevin Bradford MRN: [id number] Date of Birth: 1945-09-09  Today's Date: 01/02/2024 PT Individual Time: 0800-0815 PT Individual Time Calculation (min): 15 min  and Today's Date: 01/02/2024 PT Missed Time: 15 Minutes Missed Time Reason: Patient unwilling to participate  Short Term Goals: Week 1:  PT Short Term Goal 1 (Week 1): pt will perform bed mobility with CGA overall PT Short Term Goal 2 (Week 1): pt will transfer bed<>chair with LRAD and CGA PT Short Term Goal 3 (Week 1): pt will transfer sit<>stand with LRAD and min A  Skilled Therapeutic Interventions/Progress Updates:     Pt received seated at EOB with nursing staff present. Pt immediately reporting that he did not sleep any last night and would like to rest this morning. PT provides encouragement to participate and rationale for participation. Pt frustrated with PT for not listening. PT utilizes active listening and responding to pt's concerns about staff not being attentive, as well as importance of continued mobility and attempting difficulty mobility tasks to gain strength and functional mobility. Pt becomes more frustrated and requests to return to supine. Pt returns to supine with cues for positioning. Pt misses 15 minutes of skilled PT due to refusal to participate.   Therapy Documentation Precautions:  Precautions Precautions: Fall Precaution/Restrictions Comments: wound vac to R LE Required Braces or Orthoses: Other Brace Other Brace: R LE limb protector, L LE prosthesis Restrictions Weight Bearing Restrictions Per Provider Order: Yes RLE Weight Bearing Per Provider Order: Weight bearing as tolerated   Therapy/Group: Individual Therapy  Neva Barban, PT, DPT 01/02/2024, 10:55 AM

## 2024-01-02 NOTE — Progress Notes (Signed)
 PROGRESS NOTE   Subjective/Complaints: Pt was unhappy that he had 3 ped rails up last night instead of 4.  Reports had trouble sleeping last night.  Last BM 3 to 4 days ago.  ROS: Patient denies CP, SOB + constipation  + Insomnia Objective:    No results found. No results for input(s): WBC, HGB, HCT, PLT in the last 72 hours.  Recent Labs    01/01/24 0509  CREATININE 1.11     Intake/Output Summary (Last 24 hours) at 01/02/2024 1032 Last data filed at 01/02/2024 0845 Gross per 24 hour  Intake 596 ml  Output 325 ml  Net 271 ml        Physical Exam: Vital Signs Blood pressure (!) 100/59, pulse 60, temperature 98.3 F (36.8 C), resp. rate 18, height 4' 8.6 (1.438 m), weight 82.3 kg, SpO2 99%.  Constitutional: No distress . Vital signs reviewed.  General: No acute distress, finished with therapy laying in bed Mood and affect are appropriate Heart: Regular rate and rhythm no rubs murmurs or extra sounds Lungs: Clear to auscultation, breathing unlabored, no rales or wheezes Abdomen: Positive bowel sounds, soft nontender to palpation, nondistended Extremities: No clubbing, cyanosis, or edema Skin: No evidence of breakdown, no evidence of rash RIght BKA looks great, no drainage or erythema staples intact  Neuro:   Mental Status: AAOx3, memory intact Speech/Languate:  fluent, follows simple commands CRANIAL NERVES: 2-12 grossly intact MMT: 5/5 throughout except for R BKA   SENSORY: Normal to touch all 4 extremities Prior neuro assessment is c/w today's exam 01/02/2024.    Assessment/Plan: 1. Functional deficits which require 3+ hours per day of interdisciplinary therapy in a comprehensive inpatient rehab setting. Physiatrist is providing close team supervision and 24 hour management of active medical problems listed below. Physiatrist and rehab team continue to assess barriers to discharge/monitor  patient progress toward functional and medical goals  Care Tool:  Bathing    Body parts bathed by patient: Right arm, Left arm, Chest, Abdomen, Front perineal area, Right upper leg, Left upper leg, Face, Buttocks   Body parts bathed by helper: Buttocks Body parts n/a: Right lower leg, Left lower leg   Bathing assist Assist Level: Supervision/Verbal cueing     Upper Body Dressing/Undressing Upper body dressing   What is the patient wearing?: Pull over shirt    Upper body assist Assist Level: Set up assist    Lower Body Dressing/Undressing Lower body dressing      What is the patient wearing?: Pants, Underwear/pull up     Lower body assist Assist for lower body dressing: Supervision/Verbal cueing     Toileting Toileting    Toileting assist Assist for toileting: Minimal Assistance - Patient > 75%     Transfers Chair/bed transfer  Transfers assist  Chair/bed transfer activity did not occur: Safety/medical concerns  Chair/bed transfer assist level: Supervision/Verbal cueing (lateral scoot)     Locomotion Ambulation   Ambulation assist   Ambulation activity did not occur: Safety/medical concerns (fear, weakness, decreased balance)          Walk 10 feet activity   Assist  Walk 10 feet activity did not occur: Safety/medical concerns (fear,  weakness, decreased balance)        Walk 50 feet activity   Assist Walk 50 feet with 2 turns activity did not occur: Safety/medical concerns (fear, weakness, decreased balance)         Walk 150 feet activity   Assist Walk 150 feet activity did not occur: Safety/medical concerns (fear, weakness, decreased balance)         Walk 10 feet on uneven surface  activity   Assist Walk 10 feet on uneven surfaces activity did not occur: Safety/medical concerns (fear, weakness, decreased balance)         Wheelchair     Assist Is the patient using a wheelchair?: Yes Type of Wheelchair: Manual Wheelchair  activity did not occur: Safety/medical concerns (weakness)         Wheelchair 50 feet with 2 turns activity    Assist    Wheelchair 50 feet with 2 turns activity did not occur: Safety/medical concerns (weakness)       Wheelchair 150 feet activity     Assist  Wheelchair 150 feet activity did not occur: Safety/medical concerns (weakness)       Blood pressure (!) 100/59, pulse 60, temperature 98.3 F (36.8 C), resp. rate 18, height 4' 8.6 (1.438 m), weight 82.3 kg, SpO2 99%.   Medical Problem List and Plan: 1. 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  Wound VAC as directed             -patient may not shower due to wound vac             -ELOS/Goals: 7 days, Mod I to Sup with PT/OT            Grounds pass ordered  -Continue CIR therapies including PT, OT    - Discussed trying to participate with therapies as much as possible, will see if they can schedule for later sessions  2.  Antithrombotics: -DVT/anticoagulation:  Pharmaceutical: continue Lovenox              -antiplatelet therapy: Plavix  75 mg daily  3. Pain Management: Tramadol  50 mg every 6 hours  6/15 can change tramadol  to prn per pt's comments to RN 4. Mood/Behavior/Sleep: Melatonin 3 mg nightly.  Provide emotional support             -antipsychotic agents: N/A 5. Neuropsych/cognition: This patient is capable of making decisions on his own behalf. 6. Wound vac: messaged Dr. Julio Ohm to see if this can be removed  6/15 Pt has had wound vac for 11 days. Directions were to continue until discharge from the hospital.  -remove today, continue shrinker and dry dressing if needed 7. Fluids/Electrolytes/Nutrition: Routine in and outs with follow-up chemistries 8.  Acute blood loss anemia.  Follow-up CBC. HGB 9.6 on 6/9  -6/10 HGB stable 10.0 , monitor 9.  History of left BKA 2019.  Patient did receive CIR.  Patient does have a prosthesis.  Independent prior to  admission with prosthesis 10.  Diabetes mellitus with peripheral neuropathy.  Hemoglobin A1c 12.4.  Decrease semglee  to 19U HS  CBG (last 3)  Recent Labs    01/01/24 1627 01/01/24 2104 01/02/24 0619  GLUCAP 171* 179* 165*  Off SSI  6/15 cbg's remain reasonably controll  6/17 CBGs with fair control, continue to monitor trend for now 11.  CKD stage II.  Follow-up chemistries. Cr 1.03 on 6/10, stable continue to monitor 12.  History of CVA.  Continue Plavix  13. Constipation  Pt had bm 6/13  -6/17 Sorbitol  60ml 14. Insomnia  -6/17 increase melatonin dose to 5mg  at bedtime     LOS: 8 days A FACE TO FACE EVALUATION WAS PERFORMED  Lylia Sand 01/02/2024, 10:32 AM

## 2024-01-03 DIAGNOSIS — K5900 Constipation, unspecified: Secondary | ICD-10-CM | POA: Diagnosis not present

## 2024-01-03 DIAGNOSIS — E1142 Type 2 diabetes mellitus with diabetic polyneuropathy: Secondary | ICD-10-CM | POA: Diagnosis not present

## 2024-01-03 DIAGNOSIS — Z89511 Acquired absence of right leg below knee: Secondary | ICD-10-CM | POA: Diagnosis not present

## 2024-01-03 DIAGNOSIS — G4700 Insomnia, unspecified: Secondary | ICD-10-CM | POA: Diagnosis not present

## 2024-01-03 LAB — GLUCOSE, CAPILLARY
Glucose-Capillary: 97 mg/dL (ref 70–99)
Glucose-Capillary: 174 mg/dL — ABNORMAL HIGH (ref 70–99)
Glucose-Capillary: 137 mg/dL — ABNORMAL HIGH (ref 70–99)
Glucose-Capillary: 149 mg/dL — ABNORMAL HIGH (ref 70–99)

## 2024-01-03 MED ORDER — POLYETHYLENE GLYCOL 3350 17 GM/SCOOP PO POWD
119.0000 g | Freq: Once | ORAL | Status: DC
  Filled 2024-01-03 (×2): qty 119

## 2024-01-03 MED ORDER — ACETAMINOPHEN 325 MG PO TABS: 325.0000 mg | ORAL_TABLET | Freq: Four times a day (QID) | ORAL | Status: AC | PRN

## 2024-01-03 MED ORDER — MELATONIN 3 MG PO TABS
3.0000 mg | ORAL_TABLET | Freq: Every day | ORAL | Status: DC
  Administered 2024-01-03 – 2024-01-05 (×3): 3 mg via ORAL
  Filled 2024-01-03 (×3): qty 1

## 2024-01-03 MED ORDER — DOCUSATE SODIUM 100 MG PO CAPS: 100.0000 mg | ORAL_CAPSULE | Freq: Every day | ORAL | Status: AC

## 2024-01-03 MED ORDER — INSULIN GLARGINE-YFGN 100 UNIT/ML ~~LOC~~ SOLN
17.0000 [IU] | Freq: Every day | SUBCUTANEOUS | Status: DC
  Administered 2024-01-03 – 2024-01-04 (×2): 17 [IU] via SUBCUTANEOUS
  Filled 2024-01-03 (×3): qty 0.17

## 2024-01-03 MED ORDER — INSULIN ASPART 100 UNIT/ML IJ SOLN
0.0000 [IU] | Freq: Three times a day (TID) | INTRAMUSCULAR | Status: DC
  Administered 2024-01-03: 1 [IU] via SUBCUTANEOUS
  Administered 2024-01-03: 2 [IU] via SUBCUTANEOUS
  Administered 2024-01-04: 7 [IU] via SUBCUTANEOUS
  Administered 2024-01-04: 3 [IU] via SUBCUTANEOUS
  Administered 2024-01-05: 1 [IU] via SUBCUTANEOUS
  Administered 2024-01-05 – 2024-01-06 (×3): 2 [IU] via SUBCUTANEOUS

## 2024-01-03 NOTE — Progress Notes (Signed)
 Occupational Therapy Session Note  Patient Details  Name: Kevin Bradford MRN: [id number] Date of Birth: 1946/02/09  Today's Date: 01/03/2024 OT Individual Time: 1450-1530 OT Individual Time Calculation (min): 40 min    Short Term Goals: Week 2:  OT Short Term Goal 1 (Week 2): Pt will recall correct steps in prep for lateral scoot transfer with supervision OT Short Term Goal 2 (Week 2): Pt will donn R BKA shrinker with supervision  Skilled Therapeutic Interventions/Progress Updates:  Skilled OT intervention completed with focus on activity tolerance, cardiovascular/BUE endurance, and functional transfers. Pt received seated in w/c, agreeable to session. No pain reported.  Pt in great spirits this afternoon regarding shortened LOS. Transported dependently in w/c <> gym for energy conservation.   Pt completed the following intervals while seated at the UE Nustep utilizing pace partner to maintain RPMs to promote global/BUE endurance needed for independence with BADLs and functional transfers: -5 min, level 4, forwards -7 min, level 4, backwards Intermittent rest breaks needed for fatigue  Back in room, pt completed supervision lateral scoot from w/c > EOB with no assist or cues for managing w/c parts. Doffed LLE prosthesis independently, mod I sit > supine transition. Pt remained semi upright in bed, with bed alarm on/activated, and with all needs in reach at end of session.   Therapy Documentation Precautions:  Precautions Precautions: Fall Required Braces or Orthoses: Other Brace Other Brace: R LE limb protector, L LE prosthesis Restrictions Weight Bearing Restrictions Per Provider Order: Yes RLE Weight Bearing Per Provider Order: Non weight bearing    Therapy/Group: Individual Therapy  Ruthanna Covert, MS, OTR/L  01/03/2024, 3:45 PM

## 2024-01-03 NOTE — Progress Notes (Signed)
 Physical Therapy Session Note  Patient Details  Name: Kevin Bradford MRN: [id number] Date of Birth: [birth year]  Today's Date: 01/03/2024 PT Individual Time: 4098-1191 and 1345-1430 PT Individual Time Calculation (min): 45 min and 45 min.  Short Term Goals: Week 1:  PT Short Term Goal 1 (Week 1): pt will perform bed mobility with CGA overall PT Short Term Goal 1 - Progress (Week 1): Met PT Short Term Goal 2 (Week 1): pt will transfer bed<>chair with LRAD and CGA PT Short Term Goal 2 - Progress (Week 1): Met PT Short Term Goal 3 (Week 1): pt will transfer sit<>stand with LRAD and min A PT Short Term Goal 3 - Progress (Week 1): Progressing toward goal  Skilled Therapeutic Interventions/Progress Updates:  Ambulation/gait training;Discharge planning;Functional mobility training;Psychosocial support;Therapeutic Activities;Visual/perceptual remediation/compensation;Balance/vestibular training;Disease management/prevention;Neuromuscular re-education;Skin care/wound management;Therapeutic Exercise;Wheelchair propulsion/positioning;DME/adaptive equipment instruction;Pain management;Splinting/orthotics;UE/LE Strength taining/ROM;Community reintegration;Patient/family education;Stair training;UE/LE Coordination activities   First session:  Pt presents semi-reclined in bed and agreeable to therapy.  Pt very talkative.  Pt transfers sup to sitting EOB w/ supervision.  Pt wears limb protector in bed and adamant about maintaining.  Pt dons L prosthesis w/ set-up. Pt doffs soiled shirt and dons pull-over shirt w/ supervision .  Pt performs lateral scoot bed > w/c to right w/ supervision, pt directing care, PT stabilizing w/c.  Pt negotiates w/c x 150' w/ supervision.  Pt performed seated chest press and shoulder press 3 x 10 w/ 2# weighted bar.  Pt performs lateral scoot w/c> bed to L side w/ supervision.  Pt doffs L prosthesis w/ supervision and then transfers sit to supine w/ sup.  Bed alarm on and all needs in  reach, pt wishes 4 siderails up.    Second session: Pt presents sitting EOB finishing lunch.  Pt already has L BK prosthesis donned and agreeable to therapy.  Pt performed lateral scoot to L w/ supervision although pt states some hesitation performing to this side  haven't done it much to this side.  Pt wheeled to dayroom w/ supervision.  Pt performed lateral scoot to mat table w/ supervision, PT stabilizing w/c.  Pt performed volleyball w/ 2# weighted bar.  Pt performed throwing and catching yellow T-ball overhead and chest pass.  Pt performed trunk rotation side to side 3 x 10 w/ 4.4# weighted ball.  Pt wheeled back to room and remained sitting in w/c w/ seat alarm on and all needs in reach.  Therapy Documentation Precautions:  Precautions Precautions: Fall Precaution/Restrictions Comments: wound vac to R LE Required Braces or Orthoses: Other Brace Other Brace: R LE limb protector, L LE prosthesis Restrictions Weight Bearing Restrictions Per Provider Order: Yes RLE Weight Bearing Per Provider Order: Non weight bearing General:   Vital Signs: Therapy Vitals Temp: 97.8 F (36.6 C) Temp Source: Oral Pulse Rate: (!) 59 Resp: 16 BP: 134/73 Patient Position (if appropriate): Lying Oxygen Therapy SpO2: 100 % O2 Device: Room Air Pain:almost none      Therapy/Group: Individual Therapy  Demitrios Molyneux P Hutton [place] 01/03/2024, 8:50 AM

## 2024-01-03 NOTE — Patient Care Conference (Signed)
 Inpatient RehabilitationTeam Conference and Plan of Care Update Date: 01/03/2024   Time: 11:50 AM    Patient Name: Kevin Bradford      Medical Record Number: [id number]  Date of Birth: 08/10/1945 Sex: Male         Room/Bed: 4W19C/4W19C-01 Payor Info: Payor: [employer] MEDICARE / Plan: [employer] MEDICARE CHOICE PPO / Product Type: *No Product type* /    Admit Date/Time:  12/25/2023  4:48 PM  Primary Diagnosis:  Right below-knee amputee [facility])  Hospital Problems: Principal Problem:   Right below-knee amputee [facility]) Active Problems:   Coping style affecting medical condition    Expected Discharge Date: Expected Discharge Date: 01/06/24  Team Members Present: Physician leading conference: Dr. Lylia Sand Social Worker Present: Adrianna Albee, LCSW Nurse Present: Forrestine Ike, RN PT Present: [name], PT OT Present: Tim Fontana, OT PPS Coordinator present : Jestine Moron, SLP     Current Status/Progress Goal Weekly Team Focus  Bowel/Bladder   continent of b/b   Remain continent   Assist with toileting needs qshift and prn    Swallow/Nutrition/ Hydration               ADL's   Set up A UB, supervision LB and CGA/min A toileting   Some mod I ADLs at bed level, supervision transfers   barriers-fear and particular with mobility, decreased balance/coordination, and weakness/deconditioning, limb care edu    Mobility   bed mobility supervision using bed features, lateral scoot transfers supervision, sit<>stands in [place] from elevated surfaces mod A but unable to stand with RW. Unable to ambulate.   supervision, mod I WC level, CGA dynamic standing balace  barriers: fear, particular with mobility, decreased balance/coordination, and weakness/deconditioning    Communication                Safety/Cognition/ Behavioral Observations               Pain   No c/o pain   Remain pain free   Assess qshift and prn    Skin   Surgical incision right lower leg (BKA)    Maintain skin integrity  Assess qshift and prn      Discharge Planning:  Wife has been here and not sure has gone to therapies with husband. Will order DME and am awaiting wife to get [facility] preference   Team Discussion: Patient post right BKA with decreased balance and coordination with general weakness and constipation. Functional progress limited by fear and unique methods for doing activities.   Patient on target to meet rehab goals: yes, currently needs set up for upper body care and CGA for toileting.  Needs supervision - mod I for bed mobility.  Completes lateral scoots with supervision and sit- stand with mod assist. Goals for discharge set for supervision - mod I overall.  *See Care Plan and progress notes for long and short-term goals.   Revisions to Treatment Plan:  N/a   Teaching Needs: Safety, residual limb care, medications (SSI added per MD as at home), transfers, toileting, etc.  Current Barriers to Discharge: Decreased caregiver support and Home enviroment access/layout  Possible Resolutions to Barriers: Family education NWG:[id number], drop-arm [facility] [place]     Medical Summary Current Status: R BKA, CKD, constipation, insomnia, DM2  Barriers to Discharge: Self-care education;Medical stability  Barriers to Discharge Comments: R BKA, CKD, constipation, insomnia, DM2 Possible Resolutions to [employer] Focus: increased melatonin, started slidiing scale insulin , laxatives   Continued Need for Acute Rehabilitation Level of Care: The patient  requires daily medical management by a physician with specialized training in physical medicine and rehabilitation for the following reasons: Direction of a multidisciplinary physical rehabilitation program to maximize functional independence : Yes Medical management of patient stability for increased activity during participation in an intensive rehabilitation regime.: Yes Analysis of laboratory values and/or radiology reports with any  subsequent need for medication adjustment and/or medical intervention. : Yes   I attest that I was present, lead the team conference, and concur with the assessment and plan of the team.   Forrestine Ike B 01/03/2024, 3:16 PM

## 2024-01-03 NOTE — Progress Notes (Signed)
 PROGRESS NOTE   Subjective/Complaints: Pt reports he slept well last night. Anxious to go home. Has not had BM in few days.   ROS: Patient denies CP, SOB, N/V, abdominal pain + constipation -continued  + Insomnia Objective:    No results found. No results for input(s): WBC, HGB, HCT, PLT in the last 72 hours.  Recent Labs    01/01/24 0509  CREATININE 1.11     Intake/Output Summary (Last 24 hours) at 01/03/2024 1041 Last data filed at 01/03/2024 0820 Gross per 24 hour  Intake 840 ml  Output 300 ml  Net 540 ml        Physical Exam: Vital Signs Blood pressure 134/73, pulse (!) 59, temperature 97.8 F (36.6 C), temperature source Oral, resp. rate 16, height 4' 8.6 (1.438 m), weight 82.3 kg, SpO2 100%.  Constitutional: No distress . Vital signs reviewed.  General: No acute distress, laying in bed  Mood and affect are appropriate Heart: Regular rate and rhythm no rubs murmurs or extra sounds Lungs: Clear to auscultation, breathing unlabored, no rales or wheezes Abdomen: Positive bowel sounds, soft nontender to palpation, nondistended Extremities: No clubbing, cyanosis, or edema Skin: No evidence of breakdown, no evidence of rash RIght BKA looks great, no drainage or erythema staples intact  Neuro:   Mental Status: AAOx3, memory intact Speech/Languate:  fluent, follows simple commands CRANIAL NERVES: 2-12 grossly intact MMT: 5/5 throughout except for R BKA   SENSORY: Normal to touch all 4 extremities Prior neuro assessment is c/w today's exam 01/03/2024.    Assessment/Plan: 1. Functional deficits which require 3+ hours per day of interdisciplinary therapy in a comprehensive inpatient rehab setting. Physiatrist is providing close team supervision and 24 hour management of active medical problems listed below. Physiatrist and rehab team continue to assess barriers to discharge/monitor patient progress  toward functional and medical goals  Care Tool:  Bathing    Body parts bathed by patient: Right arm, Left arm, Chest, Abdomen, Front perineal area, Right upper leg, Left upper leg, Face, Buttocks   Body parts bathed by helper: Buttocks Body parts n/a: Right lower leg, Left lower leg   Bathing assist Assist Level: Supervision/Verbal cueing     Upper Body Dressing/Undressing Upper body dressing   What is the patient wearing?: Pull over shirt    Upper body assist Assist Level: Set up assist    Lower Body Dressing/Undressing Lower body dressing      What is the patient wearing?: Pants, Underwear/pull up     Lower body assist Assist for lower body dressing: Supervision/Verbal cueing     Toileting Toileting    Toileting assist Assist for toileting: Minimal Assistance - Patient > 75%     Transfers Chair/bed transfer  Transfers assist  Chair/bed transfer activity did not occur: Safety/medical concerns  Chair/bed transfer assist level: Supervision/Verbal cueing     Locomotion Ambulation   Ambulation assist   Ambulation activity did not occur: Safety/medical concerns (fear, weakness, decreased balance)          Walk 10 feet activity   Assist  Walk 10 feet activity did not occur: Safety/medical concerns (fear, weakness, decreased balance)  Walk 50 feet activity   Assist Walk 50 feet with 2 turns activity did not occur: Safety/medical concerns (fear, weakness, decreased balance)         Walk 150 feet activity   Assist Walk 150 feet activity did not occur: Safety/medical concerns (fear, weakness, decreased balance)         Walk 10 feet on uneven surface  activity   Assist Walk 10 feet on uneven surfaces activity did not occur: Safety/medical concerns (fear, weakness, decreased balance)         Wheelchair     Assist Is the patient using a wheelchair?: Yes Type of Wheelchair: Manual Wheelchair activity did not occur:  Safety/medical concerns (weakness)  Wheelchair assist level: Supervision/Verbal cueing Max wheelchair distance: 190ft    Wheelchair 50 feet with 2 turns activity    Assist    Wheelchair 50 feet with 2 turns activity did not occur: Safety/medical concerns (weakness)   Assist Level: Supervision/Verbal cueing   Wheelchair 150 feet activity     Assist  Wheelchair 150 feet activity did not occur: Safety/medical concerns (weakness)   Assist Level: Supervision/Verbal cueing   Blood pressure 134/73, pulse (!) 59, temperature 97.8 F (36.6 C), temperature source Oral, resp. rate 16, height 4' 8.6 (1.438 m), weight 82.3 kg, SpO2 100%.   Medical Problem List and Plan: 1. 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  Wound VAC as directed             -patient may not shower due to wound vac             -ELOS/Goals: 7 days, Mod I to Sup with PT/OT            Grounds pass ordered  -Continue CIR therapies including PT, OT    - Asked to schedule for later sessions if possible   -Team conference today please see physician documentation under team conference tab, met with team  to discuss problems,progress, and goals. Formulized individual treatment plan based on medical history, underlying problem and comorbidities.    2.  Antithrombotics: -DVT/anticoagulation:  Pharmaceutical: continue Lovenox              -antiplatelet therapy: Plavix  75 mg daily  3. Pain Management: Tramadol  50 mg every 6 hours  6/15 can change tramadol  to prn per pt's comments to RN 4. Mood/Behavior/Sleep: Melatonin 3 mg nightly.  Provide emotional support             -antipsychotic agents: N/A 5. Neuropsych/cognition: This patient is capable of making decisions on his own behalf. 6. Wound vac: messaged Dr. Julio Ohm to see if this can be removed  6/15 Pt has had wound vac for 11 days. Directions were to continue until discharge from the hospital.  -remove today,  continue shrinker and dry dressing if needed 7. Fluids/Electrolytes/Nutrition: Routine in and outs with follow-up chemistries 8.  Acute blood loss anemia.  Follow-up CBC. HGB 9.6 on 6/9  -6/10 HGB stable 10.0 , monitor 9.  History of left BKA 2019.  Patient did receive CIR.  Patient does have a prosthesis.  Independent prior to admission with prosthesis 10.  Diabetes mellitus with peripheral neuropathy.  Hemoglobin A1c 12.4.  Decrease semglee  to 19U HS  CBG (last 3)  Recent Labs    01/02/24 1636 01/02/24 2131 01/03/24 0608  GLUCAP 252* 234* 97  Off SSI  6/15 cbg's remain reasonably controll  6/17 CBGs with fair control, continue to monitor trend for now  6/18 Will add in correctional insulin , decrease lantus  to 17 to help reduce risk of AM hypoglycemia 11.  CKD stage II.  Follow-up chemistries. Cr 1.03 on 6/10, stable continue to monitor 12.  History of CVA.  Continue Plavix  13. Constipation            Pt had bm 6/13  -6/17 Sorbitol  60ml  -6/18 Bowel prep 1/2 container 14. Insomnia  -6/17 increase melatonin dose to 5mg  at bedtime   -6/18 slept better last night, continue to monitor     LOS: 9 days A FACE TO FACE EVALUATION WAS PERFORMED  Lylia Sand 01/03/2024, 10:41 AM

## 2024-01-03 NOTE — Progress Notes (Signed)
 Patient ID: Kevin Bradford, male   DOB: 06-08-1946, 78 y.o.   MRN: [id number]  met with pt and saw wife earlier to give team conference update regarding goals of supervision/mod/I and pt requesting to move up discharge date to 6/21. Will order DME and arrange follow up.

## 2024-01-03 NOTE — Progress Notes (Signed)
 Offered pt multiple times miralax  stool softener. Pt requested for after therapies adminstration and dinner time offered and pt refused d/t having a Large BM. Given pt education and still refusal.

## 2024-01-03 NOTE — Progress Notes (Signed)
 Physical Therapy Weekly Progress Note  Patient Details  Name: Kevin Bradford MRN: [id number] Date of Birth: 1946/01/23  Beginning of progress report period: December 26, 2023 End of progress report period: January 03, 2024  Today's Date: 01/03/2024 PT Individual Time: 8295-6213 PT Individual Time Calculation (min): 56 min   Patient has met 2 of 3 short term goals. Pt demonstrates gradual progress towards long term goals. Pt is currently able to perform bed mobility with supervision using bed features. Have discussed option for hospital bed or bed assist rails and pt politely declines both options. Pt is able to perform lateral scoot transfers with supervision (assist to stabilize WC), sit<>stands with RW and mod A from elevated surfaces, and perform WC mobility 137ft using BUE and supervision. Pt remains limited by fear of falling, weakness/deconditioning, and decreased balance/coordination. Have begun family education with wife, who is cleared to assist with bed<>chair transfers in room at this time. Have reinforced to pt/wife, recommendation to avoid any standing at home - both in agreement.   Patient continues to demonstrate the following deficits muscle weakness, decreased cardiorespiratoy endurance, and decreased standing balance, decreased postural control, decreased balance strategies, and difficulty maintaining precautions and therefore will continue to benefit from skilled PT intervention to increase functional independence with mobility.  Patient progressing toward long term goals..  Continue plan of care.  PT Short Term Goals Week 1:  PT Short Term Goal 1 (Week 1): pt will perform bed mobility with CGA overall PT Short Term Goal 1 - Progress (Week 1): Met PT Short Term Goal 2 (Week 1): pt will transfer bed<>chair with LRAD and CGA PT Short Term Goal 2 - Progress (Week 1): Met PT Short Term Goal 3 (Week 1): pt will transfer sit<>stand with LRAD and min A PT Short Term Goal 3 - Progress (Week  1): Progressing toward goal Week 2:  PT Short Term Goal 1 (Week 2): STG=LTG due to LOS  Skilled Therapeutic Interventions/Progress Updates:  Ambulation/gait training;Discharge planning;Functional mobility training;Psychosocial support;Therapeutic Activities;Visual/perceptual remediation/compensation;Balance/vestibular training;Disease management/prevention;Neuromuscular re-education;Skin care/wound management;Therapeutic Exercise;Wheelchair propulsion/positioning;DME/adaptive equipment instruction;Pain management;Splinting/orthotics;UE/LE Strength taining/ROM;Community reintegration;Patient/family education;Stair training;UE/LE Coordination activities   Today's Interventions: Received pt sitting EOB, pt agreeable to PT treatment, and did not state pain level during session. Session with emphasis on functional mobility/transfers, generalized strengthening and endurance, WC mobility, and simulated car transfers. Pt performed lateral scoot bed<>WC with supervision and assist to stabilize WC and performed WC mobility >353ft using BUE and supervision with emphasis on UE strength and coordination.   Pt performed simulated car transfer via lateral scoot with supervision via multiple small scoots with assist to stabilize WC. Pt then performed WC mobility 50ft on uneven surfaces (ramp) with supervision ascending backwards and descending forwards. Pt then performed lateral scoot on/off mat with supervision with assist to stabilize WC and performed the following exercises with emphasis on UE/core strength: -overhead chest press<>horizontal chest press with tidal tank x10 each direction -lateral trunk rotations with tidal tank x10 bilaterally  Returned to room and performed lateral scoot WC<>bed with supervision and sit<>supine with supervision. Concluded session with pt semi-reclined in bed, needs within reach, and bed alarm on.   Therapy Documentation Precautions:  Precautions Precautions:  Fall Precaution/Restrictions Comments: wound vac to R LE Required Braces or Orthoses: Other Brace Other Brace: R LE limb protector, L LE prosthesis Restrictions Weight Bearing Restrictions Per Provider Order: Yes RLE Weight Bearing Per Provider Order: Non weight bearing  Therapy/Group: Individual Therapy [name] [name] PT, DPT 01/03/2024, 6:58  AM

## 2024-01-03 NOTE — Progress Notes (Signed)
 Pt melatonin was increased from 3mg  to 5mg  last night after he reported to the doctor his troubles with sleeping. Pt woke up a little disoriented. During this confusion state he called his wife to verify time and location. Pt called to the nurses station asking to speak to the nurse. I entered the room and pt explained his concerns. I educated pt that he had a good nights rest and was able to catch up on some much needed sleep and due to the increase of melatonin it likely caused a little disorientation. Pt's wife hysterically called the desk with demands saying did you check his pupils, is he having a stroke, the doctor needs to be contacted and needs to see him NOW, I explained to the wife about the melatonin and his disorientation resolving immediately. Pt was disoriented for a few mins and was able to answer all orientation questions without hesitation. Vitals and cbg were all within normal range. I asked pt did he take melatonin at home and he stated sometimes, I have the gummies but I don't take them every night. Pt is resting in bed, call light in reach, bed alarm on.

## 2024-01-04 LAB — GLUCOSE, CAPILLARY
Glucose-Capillary: 214 mg/dL — ABNORMAL HIGH (ref 70–99)
Glucose-Capillary: 115 mg/dL — ABNORMAL HIGH (ref 70–99)
Glucose-Capillary: 334 mg/dL — ABNORMAL HIGH (ref 70–99)
Glucose-Capillary: 292 mg/dL — ABNORMAL HIGH (ref 70–99)

## 2024-01-04 MED ORDER — INSULIN ASPART 100 UNIT/ML IJ SOLN
2.0000 [IU] | Freq: Three times a day (TID) | INTRAMUSCULAR | Status: DC
  Administered 2024-01-05 – 2024-01-06 (×4): 2 [IU] via SUBCUTANEOUS

## 2024-01-04 NOTE — Progress Notes (Signed)
 PROGRESS NOTE   Subjective/Complaints: No acute events overnight.  Reports he had a good bowel movement yesterday.  Looking forward to going home soon.  He would like to go back to 3 mg melatonin, feels like 5 mg is too strong.  ROS: Patient denies CP, SOB, N/V, abdominal pain + constipation -continued  + Insomnia Objective:    No results found. No results for input(s): WBC, HGB, HCT, PLT in the last 72 hours.  No results for input(s): NA, K, CL, CO2, GLUCOSE, BUN, CREATININE, CALCIUM  in the last 72 hours.    Intake/Output Summary (Last 24 hours) at 01/04/2024 1713 Last data filed at 01/04/2024 0736 Gross per 24 hour  Intake 240 ml  Output 450 ml  Net -210 ml        Physical Exam: Vital Signs Blood pressure 116/70, pulse 87, temperature (!) 97.5 F (36.4 C), temperature source Oral, resp. rate 18, height 4' 8.6 (1.438 m), weight 82.3 kg, SpO2 100%.  Constitutional: No distress . Vital signs reviewed.  General: No acute distress, laying in bed  Mood and affect are appropriate Heart: Regular rate and rhythm no rubs murmurs or extra sounds Lungs: Clear to auscultation, breathing unlabored, no rales or wheezes Abdomen: Positive bowel sounds, soft nontender to palpation, nondistended Extremities: No clubbing, cyanosis, or edema Skin: No evidence of breakdown, no evidence of rash RIght BKA looks great, no drainage or erythema staples intact  Neuro:   Mental Status: AAOx3, memory intact Speech/Languate:  fluent, follows simple commands CRANIAL NERVES: 2-12 grossly intact MMT: 5/5 throughout except for R BKA   SENSORY: Normal to touch all 4 extremities Prior neuro assessment is c/w today's exam 01/04/2024.    Assessment/Plan: 1. Functional deficits which require 3+ hours per day of interdisciplinary therapy in a comprehensive inpatient rehab setting. Physiatrist is providing close team  supervision and 24 hour management of active medical problems listed below. Physiatrist and rehab team continue to assess barriers to discharge/monitor patient progress toward functional and medical goals  Care Tool:  Bathing    Body parts bathed by patient: Right arm, Left arm, Chest, Abdomen, Front perineal area, Right upper leg, Left upper leg, Face, Buttocks   Body parts bathed by helper: Buttocks Body parts n/a: Right lower leg, Left lower leg   Bathing assist Assist Level: Supervision/Verbal cueing     Upper Body Dressing/Undressing Upper body dressing   What is the patient wearing?: Pull over shirt    Upper body assist Assist Level: Set up assist    Lower Body Dressing/Undressing Lower body dressing      What is the patient wearing?: Pants, Underwear/pull up     Lower body assist Assist for lower body dressing: Supervision/Verbal cueing     Toileting Toileting    Toileting assist Assist for toileting: Minimal Assistance - Patient > 75%     Transfers Chair/bed transfer  Transfers assist  Chair/bed transfer activity did not occur: Safety/medical concerns  Chair/bed transfer assist level: Supervision/Verbal cueing     Locomotion Ambulation   Ambulation assist   Ambulation activity did not occur: Safety/medical concerns (fear, weakness, decreased balance)          Walk  10 feet activity   Assist  Walk 10 feet activity did not occur: Safety/medical concerns (fear, weakness, decreased balance)        Walk 50 feet activity   Assist Walk 50 feet with 2 turns activity did not occur: Safety/medical concerns (fear, weakness, decreased balance)         Walk 150 feet activity   Assist Walk 150 feet activity did not occur: Safety/medical concerns (fear, weakness, decreased balance)         Walk 10 feet on uneven surface  activity   Assist Walk 10 feet on uneven surfaces activity did not occur: Safety/medical concerns (fear, weakness,  decreased balance)         Wheelchair     Assist Is the patient using a wheelchair?: Yes Type of Wheelchair: Manual Wheelchair activity did not occur: Safety/medical concerns (weakness)  Wheelchair assist level: Supervision/Verbal cueing Max wheelchair distance: 150+    Wheelchair 50 feet with 2 turns activity    Assist    Wheelchair 50 feet with 2 turns activity did not occur: Safety/medical concerns (weakness)   Assist Level: Supervision/Verbal cueing   Wheelchair 150 feet activity     Assist  Wheelchair 150 feet activity did not occur: Safety/medical concerns (weakness)   Assist Level: Supervision/Verbal cueing   Blood pressure 116/70, pulse 87, temperature (!) 97.5 F (36.4 C), temperature source Oral, resp. rate 18, height 4' 8.6 (1.438 m), weight 82.3 kg, SpO2 100%.   Medical Problem List and Plan: 1. 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  Wound VAC as directed             -patient may not shower due to wound vac             -ELOS/Goals: 7 days, Mod I to Sup with PT/OT            Grounds pass ordered  -Continue CIR therapies including PT, OT    - Asked to schedule for later sessions if possible   -expected discharge 6/21    2.  Antithrombotics: -DVT/anticoagulation:  Pharmaceutical: continue Lovenox              -antiplatelet therapy: Plavix  75 mg daily  3. Pain Management: Tramadol  50 mg every 6 hours  6/15 can change tramadol  to prn per pt's comments to RN 4. Mood/Behavior/Sleep: Melatonin 3 mg nightly.  Provide emotional support             -antipsychotic agents: N/A 5. Neuropsych/cognition: This patient is capable of making decisions on his own behalf. 6. Wound vac: messaged Dr. Julio Ohm to see if this can be removed  6/15 Pt has had wound vac for 11 days. Directions were to continue until discharge from the hospital.  -remove today, continue shrinker and dry dressing if needed 7.  Fluids/Electrolytes/Nutrition: Routine in and outs with follow-up chemistries 8.  Acute blood loss anemia.  Follow-up CBC. HGB 9.6 on 6/9  -6/10 HGB stable 10.0 , monitor  Recheck tomorrow  9.  History of left BKA 2019.  Patient did receive CIR.  Patient does have a prosthesis.  Independent prior to admission with prosthesis 10.  Diabetes mellitus with peripheral neuropathy.  Hemoglobin A1c 12.4.  Decrease semglee  to 19U HS  CBG (last 3)  Recent Labs    01/04/24 0617 01/04/24 1200 01/04/24 1703  GLUCAP 214* 115* 334*  Off SSI  6/15 cbg's remain reasonably controll  6/17 CBGs with fair control, continue to monitor trend  for now  6/18 Will add in correctional insulin , decrease lantus  to 17 to help reduce risk of AM hypoglycemia  6/19 add 2 unit mealtime novolog  11.  CKD stage II.  Follow-up chemistries. Cr 1.03 on 6/10, stable continue to monitor 12.  History of CVA.  Continue Plavix  13. Constipation            Pt had bm 6/13  -6/17 Sorbitol  60ml  -6/18 Bowel prep 1/2 container  -6/19 had a bowel movement yesterday, appears she had this without the additional medication 14. Insomnia  -6/17 increase melatonin dose to 5mg  at bedtime   -6/19 decrease melatonin back to 3 mg per patient request    LOS: 10 days A FACE TO FACE EVALUATION WAS PERFORMED  Kevin Bradford 01/04/2024, 5:13 PM

## 2024-01-04 NOTE — Progress Notes (Addendum)
 Occupational Therapy Session Note  Patient Details  Name: Kevin Bradford MRN: [id number] Date of Birth: [birth year]  Today's Date: 01/04/2024 OT Individual Time: 1106-1201 ( session 1)  OT Individual Time Calculation (min): 55 min   OT Individual Time: 1347-1503 ( session 2)    Short Term Goals: Week 2:  OT Short Term Goal 1 (Week 2): Pt will recall correct steps in prep for lateral scoot transfer with supervision OT Short Term Goal 2 (Week 2): Pt will donn R BKA shrinker with supervision  Skilled Therapeutic Interventions/Progress Updates:  Session 1: Pt greeted seated in w/c, pt agreeable to OT intervention.    Pts wife present during session.   Transfers/bed mobility/functional mobility:  Pt completed all lateral scoot transfers with supervision, MIN intermittent cues needed for w/c parts mgmt during transfers.  Therapeutic activity:  Pt completed lateral lean task with pt instructed to lean side to side on mat table to retrieve bean bags on R and L side and then return to midline using core strength to return to midline and then toss bean bags to target. Pt completed task with MOD cues for set- up and technique but overall supervision.   Pt completed similar task from [facility] however this time pt instructed to reach behind to retrieve bean bags to facilitate trunk rotation for oblique strengthening. Pt completed task with supervision.   Exercises:  Pt completed below BUE therex with 3lb weighted ball from compliant surface from EOM for core strengthening: -1 min of wood chops - 1 min of chest presses -1 min of bicep curls -1 min of OH presses   3 mins of zoom ball to facilitate improved global strength and endurance from EOM.    Ended session with pt seated in w/c with all needs within reach and wife present who is checked off on assisting pt with transfers.       Session 2:  Pt greeted seated EOB with [employer] personnel dropping off DME, pt agreeable to OT intervention.       Transfers/bed mobility/functional mobility: pt completed all lateral scoot transfers with supervision, MIN cues needed to manage w/c parts during transfers.   Pt completed x5 sit>stands in // bars with CGA for BUE strengthening as pt predominantly using BUES to pull up into standing.   Pt completed w/c propulsion greater than a household distance MODI.   Therapeutic activity: pt completed seated BUE strengthening task with pt instructed to lean forward from w/c to remove pegs from vertical board with 4lb wrist weights donned on BUEs. Pt completed task with supervision.   Education: full education and demo provided on manual w/c for home with education provided on brake extenders, leg rests, arm rest positioning. Pt able to return demo all education but still requires assistance to recall how to remove leg rests. During education did note that w/c is too small as primary PT ordered 16x18 and [employer] brought 16x16. SW aware and working on problem.    Ended session with pt supine in bed with all needs within reach and bed alarm activated.                   Therapy Documentation Precautions:  Precautions Precautions: Fall Precaution/Restrictions Comments: wound vac to R LE Required Braces or Orthoses: Other Brace Other Brace: R LE limb protector, L LE prosthesis Restrictions Weight Bearing Restrictions Per Provider Order: Yes RLE Weight Bearing Per Provider Order: Non weight bearing  Pain: no pain reported during either session.  Therapy/Group: Individual Therapy  Mollie Anger [facility] 01/04/2024, 12:21 PM

## 2024-01-04 NOTE — Progress Notes (Signed)
 Patient ID: Kevin Bradford, male   DOB: May 23, 1946, 78 y.o.   MRN: [id number]  Gave pt and wife the number for [employer] to pay the co-pay so can receive their equipment. Wife to follow up with

## 2024-01-04 NOTE — Progress Notes (Signed)
 Physical Therapy Discharge Summary  Patient Details  Name: Kevin Bradford MRN: [id number] Date of Birth: 04-16-1946  Date of Discharge from PT service:January 05, 2024  Patient has met 7 of 8 long term goals due to improved activity tolerance, improved balance, improved postural control, increased strength, increased range of motion, decreased pain, ability to compensate for deficits, improved awareness, and improved coordination.  Patient to discharge at a wheelchair level supervision/Modified Independent. Patient's care partner is independent to provide the necessary physical assistance at discharge.  Reasons goals not met: Pt did not meet dynamic standing goal of CGA as pt only able to complete static standing due to weakness, deconditioning, fatigue, and decreased balance strategies.   Recommendation:  Patient will benefit from ongoing skilled PT services in home health setting to continue to advance safe functional mobility, address ongoing impairments in transfers, generalized strengthening and endurance, dynamic standing balance/coordination, gait training, NMR, and to minimize fall risk.  Equipment: 16x18 manual WC with bilateral elevating legrests  Reasons for discharge: treatment goals met and discharge from hospital  Patient/family agrees with progress made and goals achieved: Yes  PT Discharge Precautions/Restrictions Precautions Precautions: Fall Required Braces or Orthoses: Other Brace Other Brace: R LE limb protector, L LE prosthesis Restrictions Weight Bearing Restrictions Per Provider Order: Yes RLE Weight Bearing Per Provider Order: Non weight bearing Pain Interference Pain Interference Pain Effect on Sleep: 0. Does not apply - I have not had any pain or hurting in the past 5 days Pain Interference with Therapy Activities: 0. Does not apply - I have not received rehabilitationtherapy in the past 5 days Pain Interference with Day-to-Day Activities: 1. Rarely or not at  all Cognition Overall Cognitive Status: Within Functional Limits for tasks assessed Arousal/Alertness: Awake/alert Orientation Level: Oriented X4 Memory: Appears intact Awareness: Appears intact Problem Solving: Appears intact Safety/Judgment: Appears intact Sensation Sensation Light Touch: Appears Intact Hot/Cold: Not tested Proprioception: Appears Intact Stereognosis: Appears Intact Coordination Gross Motor Movements are Fluid and Coordinated: No Fine Motor Movements are Fluid and Coordinated: Yes Coordination and Movement Description: altered balance strategies due to bilateral BKA Motor  Motor Motor: Abnormal postural alignment and control Motor - Skilled Clinical Observations: altered balance strategies due to new R BKA and old L BKA with prosthetic  Mobility Bed Mobility Bed Mobility: Rolling Right;Rolling Left;Sit to Supine;Supine to Sit Rolling Right: Independent with assistive device Rolling Left: Independent with assistive device Supine to Sit: Independent with assistive device Sit to Supine: Independent with assistive device Transfers Transfers: Lateral/Scoot Transfers;Stand to Sit;Sit to Stand Sit to Stand: Contact Guard/Touching assist (// bars) Stand to Sit: Contact Guard/Touching assist (// bars) Lateral/Scoot Transfers: Supervision/Verbal cueing Transfer (Assistive device): None Locomotion  Gait Ambulation: No Gait Gait: No Stairs / Additional Locomotion Stairs: No Wheelchair Mobility Wheelchair Mobility: Yes Wheelchair Assistance: Independent with Scientist, research (life sciences): Both upper extremities Wheelchair Parts Management: Needs assistance Distance: >160ft  Trunk/Postural Assessment  Cervical Assessment Cervical Assessment: Exceptions to [facility] (forward head) Thoracic Assessment Thoracic Assessment: Exceptions to [facility] (rounded shoulders) Lumbar Assessment Lumbar Assessment: Exceptions to [facility] (posterior pelvic tilt) Postural  Control Postural Control: Deficits on evaluation Righting Reactions: delayed due to bilateral BKA (new R BKA) - improved since eval Protective Responses: delayed due to bilateral BKA (new R BKA) - improved since eval  Balance Balance Balance Assessed: Yes Static Sitting Balance Static Sitting - Balance Support: Feet supported;Bilateral upper extremity supported Static Sitting - Level of Assistance: 7: Independent Dynamic Sitting Balance Dynamic Sitting - Balance Support: Feet  supported;No upper extremity supported Dynamic Sitting - Level of Assistance: 6: Modified independent (Device/Increase time) Static Standing Balance Static Standing - Balance Support: Bilateral upper extremity supported (RW) Static Standing - Level of Assistance: 4: Min assist Extremity Assessment  RLE Assessment RLE Assessment: Exceptions to [facility] Active Range of Motion (AROM) Comments: R BKA RLE Strength RLE Overall Strength: Deficits Right Hip Flexion: 4-/5 Right Hip Extension: 4-/5 Right Hip ABduction: 4-/5 Right Hip ADduction: 4-/5 Right Knee Flexion: 4-/5 Right Knee Extension: 4-/5 LLE Assessment LLE Assessment: Exceptions to [facility] Active Range of Motion (AROM) Comments: L BKA General Strength Comments: Grossly 4/5   Marisal Swarey M Zaunegger Therisa Stains PT, DPT 01/04/2024, 3:02 PM

## 2024-01-04 NOTE — Progress Notes (Signed)
 Physical Therapy Session Note  Patient Details  Name: Kevin Bradford MRN: [id number] Date of Birth: [birth year]  Today's Date: 01/04/2024 PT Individual Time: 0920-1043 PT Individual Time Calculation (min): 83 min   Short Term Goals: Week 1:  PT Short Term Goal 1 (Week 1): pt will perform bed mobility with CGA overall PT Short Term Goal 1 - Progress (Week 1): Met PT Short Term Goal 2 (Week 1): pt will transfer bed<>chair with LRAD and CGA PT Short Term Goal 2 - Progress (Week 1): Met PT Short Term Goal 3 (Week 1): pt will transfer sit<>stand with LRAD and min A PT Short Term Goal 3 - Progress (Week 1): Progressing toward goal Week 2:  PT Short Term Goal 1 (Week 2): STG=LTG due to LOS  Skilled Therapeutic Interventions/Progress Updates:   Received pt sitting EOB with wife at bedside. Pt agreeable to PT treatment and reported very little pain. Session with emphasis on dressing, functional mobility/transfers, generalized strengthening and endurance, and simulated car transfers. Pt requesting to get dressed - doffed dirty shirt and donned clean one with setup assist. Pt able to don/doff L prosthetic independently and limb guard with supervision. Removed dirty boxers and donned clean ones and shorts via lateral leans and rolling L/R in bed with assist from wife. Removed 3XL shrinker (pt reported not being changed in 2 days). Removed dressing and noted mild drainage and slight dehiscence from medical aspect of distal limb - RN notified and present to inspect incision. Donned 2XL shrinker directly on incision with total A. Educated pt/wife on shrinker wear/care guidelines and provided handout. Pt performed x 3 lateral scoot transfers with supervision throughout session with wife stabilizing WC. Pt and wife able to manage donning/doffing legrests with min cues overall but would benefit from continued practice.   Pt performed WC mobility >317ft using BUE and min A (pt's wife opting to assist with pushing)  to ortho gym. Pt performed simulated car transfer via lateral scoot with supervision provided by wife, then transported to dayroom in [facility] dependently. Pt performed seated LLE strengthening on Kinetron at 20 cm/sec for 1 minute increasing to 10 cm/sec for 1 minute x 3 additional trails with therapist providing manual counter resistance with emphasis on glute/quad strength. Returned to room and concluded session with pt sitting in [facility] with all needs within reach awaiting upcoming OT session. Pt/wife verbalized and demonstrated feeling prepared for discharge this Saturday.   Therapy Documentation Precautions:  Precautions Precautions: Fall Precaution/Restrictions Comments: wound vac to R LE Required Braces or Orthoses: Other Brace Other Brace: R LE limb protector, L LE prosthesis Restrictions Weight Bearing Restrictions Per Provider Order: Yes RLE Weight Bearing Per Provider Order: Non weight bearing  Therapy/Group: Individual Therapy [name] [name] PT, DPT 01/04/2024, 7:01 AM

## 2024-01-05 ENCOUNTER — Other Ambulatory Visit (HOSPITAL_COMMUNITY): Payer: Self-pay

## 2024-01-05 LAB — BASIC METABOLIC PANEL WITH GFR
Anion gap: 8 (ref 5–15)
BUN: 13 mg/dL (ref 8–23)
CO2: 24 mmol/L (ref 22–32)
Calcium: 8.5 mg/dL — ABNORMAL LOW (ref 8.9–10.3)
Chloride: 107 mmol/L (ref 98–111)
Creatinine, Ser: 1.08 mg/dL (ref 0.61–1.24)
GFR, Estimated: >60 mL/min (ref >60)
Glucose, Bld: 194 mg/dL — ABNORMAL HIGH (ref 70–99)
Potassium: 4.1 mmol/L (ref 3.5–5.1)
Sodium: 139 mmol/L (ref 135–145)

## 2024-01-05 LAB — CBC
HCT: 30.3 % — ABNORMAL LOW (ref 39.0–52.0)
Hemoglobin: 9.8 g/dL — ABNORMAL LOW (ref 13.0–17.0)
MCH: 30.3 pg (ref 26.0–34.0)
MCHC: 32.3 g/dL (ref 30.0–36.0)
MCV: 93.8 fL (ref 80.0–100.0)
Platelets: 273 10*3/uL (ref 150–400)
RBC: 3.23 MIL/uL — ABNORMAL LOW (ref 4.22–5.81)
RDW: 13.6 % (ref 11.5–15.5)
WBC: 4.5 10*3/uL (ref 4.0–10.5)
nRBC: 0 % (ref 0.0–0.2)

## 2024-01-05 LAB — GLUCOSE, CAPILLARY
Glucose-Capillary: 198 mg/dL — ABNORMAL HIGH (ref 70–99)
Glucose-Capillary: 132 mg/dL — ABNORMAL HIGH (ref 70–99)
Glucose-Capillary: 161 mg/dL — ABNORMAL HIGH (ref 70–99)
Glucose-Capillary: 234 mg/dL — ABNORMAL HIGH (ref 70–99)

## 2024-01-05 MED ORDER — METFORMIN HCL 500 MG PO TABS
1000.0000 mg | ORAL_TABLET | Freq: Two times a day (BID) | ORAL | 0 refills | Status: AC
  Filled 2024-01-05: qty 120, 30d supply, fill #0

## 2024-01-05 MED ORDER — MAGNESIUM OXIDE 400 MG PO TABS
400.0000 mg | ORAL_TABLET | Freq: Every day | ORAL | 0 refills | Status: AC
  Filled 2024-01-05: qty 30, 30d supply, fill #0

## 2024-01-05 MED ORDER — PANTOPRAZOLE SODIUM 40 MG PO TBEC
40.0000 mg | DELAYED_RELEASE_TABLET | Freq: Every day | ORAL | 0 refills | Status: AC
  Filled 2024-01-05: qty 30, 30d supply, fill #0

## 2024-01-05 MED ORDER — MELATONIN 3 MG PO TABS
3.0000 mg | ORAL_TABLET | Freq: Every day | ORAL | 0 refills | Status: AC
  Filled 2024-01-05: qty 30, 30d supply, fill #0

## 2024-01-05 MED ORDER — TRAMADOL HCL 50 MG PO TABS
50.0000 mg | ORAL_TABLET | Freq: Four times a day (QID) | ORAL | 0 refills | Status: AC | PRN
  Filled 2024-01-05: qty 30, 8d supply, fill #0

## 2024-01-05 MED ORDER — ADULT MULTIVITAMIN W/MINERALS CH: 1.0000 | ORAL_TABLET | Freq: Every day | ORAL | Status: AC

## 2024-01-05 MED ORDER — VITAMIN D3 25 MCG (1000 UNIT) PO TABS
1000.0000 [IU] | ORAL_TABLET | Freq: Every day | ORAL | 0 refills | Status: AC
  Filled 2024-01-05: qty 30, 30d supply, fill #0

## 2024-01-05 MED ORDER — ASCORBIC ACID 500 MG PO TABS
500.0000 mg | ORAL_TABLET | Freq: Every day | ORAL | 0 refills | Status: AC
Start: 2024-01-05 — End: ?
  Filled 2024-01-05: qty 30, 30d supply, fill #0

## 2024-01-05 MED ORDER — LEVEMIR FLEXTOUCH 100 UNIT/ML ~~LOC~~ SOPN
17.0000 [IU] | PEN_INJECTOR | Freq: Every day | SUBCUTANEOUS | 2 refills | Status: AC
  Filled 2024-01-05: qty 15, 88d supply, fill #0

## 2024-01-05 MED ORDER — INSULIN GLARGINE-YFGN 100 UNIT/ML ~~LOC~~ SOLN
19.0000 [IU] | Freq: Every day | SUBCUTANEOUS | Status: DC
  Administered 2024-01-05: 19 [IU] via SUBCUTANEOUS
  Filled 2024-01-05 (×2): qty 0.19

## 2024-01-05 MED ORDER — CLOPIDOGREL BISULFATE 75 MG PO TABS
75.0000 mg | ORAL_TABLET | Freq: Every day | ORAL | 0 refills | Status: AC
  Filled 2024-01-05: qty 30, 30d supply, fill #0

## 2024-01-05 MED ORDER — POLYETHYLENE GLYCOL 3350 17 G PO PACK
17.0000 g | PACK | Freq: Every day | ORAL | Status: DC
  Filled 2024-01-05 (×2): qty 1

## 2024-01-05 MED ORDER — INSULIN PEN NEEDLE 32G X 4 MM MISC
1.0000 | Freq: Three times a day (TID) | 0 refills | Status: AC
  Filled 2024-01-05: qty 100, 30d supply, fill #0

## 2024-01-05 MED ORDER — NOVOLOG FLEXPEN 100 UNIT/ML ~~LOC~~ SOPN
4.0000 [IU] | PEN_INJECTOR | Freq: Three times a day (TID) | SUBCUTANEOUS | 11 refills | Status: AC
  Filled 2024-01-05: qty 3, 25d supply, fill #0

## 2024-01-05 NOTE — Progress Notes (Signed)
 Inpatient Rehabilitation Care Coordinator Discharge Note DC SAT 6/21  Patient Details  Name: Kevin Bradford MRN: [id number] Date of Birth: March 27, 1946   Discharge location: HOME WITH WIFE WHO IS ABLE TO PROVIDE SUPERVISION LEVEL  Length of Stay: 12 DAYS  Discharge activity level: MOD/I WHEELCHAIR LEVEL  Home/community participation: ACTIVE  Patient response IO:NGEXBM Literacy - How often do you need to have someone help you when you read instructions, pamphlets, or other written material from your doctor or pharmacy?: Never  Patient response WU:XLKGMW Isolation - How often do you feel lonely or isolated from those around you?: Never  Services provided included: MD, RD, PT, OT, RN, CM, Pharmacy, Neuropsych, SW  Financial Services:  Field seismologist Utilized: Scientific laboratory technician MEDICARE  Choices offered to/list presented to: PT AND WIFE  Follow-up services arranged:  Home Health, DME, Patient/Family has no preference for HH/DME agencies Home Health Agency: [facility]  PT  OT  RN    DME : [facility] BEDSIDE COMMODE AND WHEELCHAIR    Patient response to transportation need: Is the patient able to respond to transportation needs?: Yes In the past 12 months, has lack of transportation kept you from medical appointments or from getting medications?: No In the past 12 months, has lack of transportation kept you from meetings, work, or from getting things needed for daily living?: No   Patient/Family verbalized understanding of follow-up arrangements:  Yes  Individual responsible for coordination of the follow-up plan: SELF AND [name]-WIFE  [phone]  Confirmed correct DME delivered: [name] 01/05/2024    Comments (or additional information): WIFE WAS HERE FOR EDUCATION AND PT DEMONSTRATED TRANSFERS AND INDEPENDENCE WITH WHEELCHAIR. BOTH FEEL READY TO GO HOME TOMORROW  Summary of Stay    Date/Time Discharge Planning CSW  01/03/24  0908 Wife has been here and not sure has gone to therapies with husband. Will order DME and am awaiting wife to get [facility] preference RGD  12/27/23 0922 Home with wife who can assist pt feels been through this before and knows what to expect. Hopes to be mod/i level at DC RGD       [name] G

## 2024-01-05 NOTE — Progress Notes (Signed)
 Occupational Therapy Discharge Summary  Patient Details  Name: Kevin Bradford MRN: [id number] Date of Birth: Jan 27, 1946  Date of Discharge from OT service:January 05, 2024  Today's Date: 01/05/2024 OT Individual Time: 1050-1130 OT Individual Time Calculation (min): 40 min   Patient received seated in wheelchair, wife at bedside.  Reviewed BADL process and ADL equipment for home.  Patient and wife eager to return home.  Discussed skin check, wound management, HH therapy, and possible OP PT once he receives prosthesis.  Patient pleased with overall progress, and ready for discharge tomorrow.    Patient has met 6 of 8 long term goals due to improved activity tolerance, improved balance, and ability to compensate for deficits.  Patient to discharge at overall Supervision level.  Patient's care partner is independent to provide the necessary physical assistance at discharge.    Reasons goals not met: Did not address shower transfer as shower is located upstairs.  Continues to need assistance intermittently with sit to stand and stand balance  Recommendation:  Patient will benefit from ongoing skilled OT services in home health setting to continue to advance functional skills in the area of BADL and Reduce care partner burden.  Equipment: Wheelchair, drop arm commode  Reasons for discharge: treatment goals met and discharge from hospital  Patient/family agrees with progress made and goals achieved: Yes  OT Discharge Precautions/Restrictions  Precautions Precautions: Fall Recall of Precautions/Restrictions: Intact Other Brace: R LE limb protector, L LE prosthesis Restrictions Weight Bearing Restrictions Per Provider Order: Yes RLE Weight Bearing Per Provider Order: Non weight bearing Other Position/Activity Restrictions: New R BKA  Pain Pain Assessment Pain Score: 0-No pain ADL ADL Eating: Independent Where Assessed-Eating: Chair Grooming: Modified independent Where  Assessed-Grooming: Chair Upper Body Bathing: Modified independent Where Assessed-Upper Body Bathing: Sitting at sink Lower Body Bathing: Modified independent Where Assessed-Lower Body Bathing: Sitting at sink Upper Body Dressing: Modified independent (Device) Where Assessed-Upper Body Dressing: Sitting at sink Lower Body Dressing: Modified independent Where Assessed-Lower Body Dressing: Edge of bed, Sitting at sink Toileting: Supervision/safety Where Assessed-Toileting: Teacher, adult education: Close supervision Toilet Transfer Method: Ambulance person: Drop arm bedside commode Tub/Shower Transfer: Unable to assess Tub/Shower Transfer Method: Unable to assess Film/video editor: Unable to assess Film/video editor Method: Unable to assess Vision Baseline Vision/History: 1 Wears glasses Patient Visual Report: No change from baseline Vision Assessment?: No apparent visual deficits Perception  Perception: Within Functional Limits Praxis Praxis: WFL Cognition Cognition Overall Cognitive Status: Within Functional Limits for tasks assessed Arousal/Alertness: Awake/alert Orientation Level: Person;Place;Situation Person: Oriented Place: Oriented Situation: Oriented Memory: Appears intact Attention: Alternating Awareness: Appears intact Problem Solving: Appears intact Safety/Judgment: Appears intact Brief Interview for Mental Status (BIMS) Repetition of Three Words (First Attempt): 3 Temporal Orientation: Year: Correct Temporal Orientation: Month: Accurate within 5 days Temporal Orientation: Day: Correct Recall: Sock: Yes, no cue required Recall: Blue: Yes, no cue required Recall: Bed: Yes, no cue required BIMS Summary Score: 15 Sensation Sensation Light Touch: Appears Intact Hot/Cold: Appears Intact Proprioception: Appears Intact Stereognosis: Appears Intact Coordination Gross Motor Movements are Fluid and Coordinated: No Fine Motor  Movements are Fluid and Coordinated: Yes Coordination and Movement Description: altered balance strategies due to bilateral BKA Motor  Motor Motor: Abnormal postural alignment and control Motor - Skilled Clinical Observations: altered balance strategies due to new R BKA and old L BKA with prosthetic Motor - Discharge Observations: altered balance strategies due to new R BKA and old L BKA with prosthetic Mobility  Bed Mobility Bed Mobility: Rolling Right;Rolling Left;Sit to Supine;Supine to Sit Rolling Right: Independent with assistive device Rolling Left: Independent with assistive device Supine to Sit: Independent with assistive device Sit to Supine: Independent with assistive device  Trunk/Postural Assessment  Cervical Assessment Cervical Assessment: Exceptions to [facility] Thoracic Assessment Thoracic Assessment: Exceptions to [facility] Lumbar Assessment Lumbar Assessment: Exceptions to [facility] Postural Control Righting Reactions: delayed due to bilateral BKA (new R BKA) - improved since eval Protective Responses: delayed due to bilateral BKA (new R BKA) - improved since eval  Balance Balance Balance Assessed: Yes Static Sitting Balance Static Sitting - Balance Support: Feet supported;Bilateral upper extremity supported Static Sitting - Level of Assistance: 7: Independent Dynamic Sitting Balance Dynamic Sitting - Balance Support: Feet supported;No upper extremity supported Dynamic Sitting - Level of Assistance: 6: Modified independent (Device/Increase time) Static Standing Balance Static Standing - Level of Assistance: 4: Min assist Extremity/Trunk Assessment RUE Assessment RUE Assessment: Within Functional Limits LUE Assessment LUE Assessment: Within Functional Limits   [name] 01/05/2024, 1:36 PM

## 2024-01-05 NOTE — Progress Notes (Signed)
 Inpatient Rehabilitation Discharge Medication Review by a Pharmacist  A complete drug regimen review was completed for this patient to identify any potential clinically significant medication issues.  High Risk Drug Classes Is patient taking? Indication by Medication  Antipsychotic No   Anticoagulant No   Antibiotic No   Opioid Yes Tramadol  for acute pain   Antiplatelet Yes Clopidogrel  for PAD  Hypoglycemics/insulin  Yes Insulin  detemir and insulin  aspart for DM2  Vasoactive Medication No   Chemotherapy No   Other Yes Acetaminophen  for mild pain Melatonin for sleep Metformin  for DM2  Pantoprazole  for GERD  Diclofenac gel for joint pain      Type of Medication Issue Identified Description of Issue Recommendation(s)  Drug Interaction(s) (clinically significant)     Duplicate Therapy     Allergy     No Medication Administration End Date     Incorrect Dose     Additional Drug Therapy Needed  Pen needles  Rx sent to [facility]   Significant med changes from prior encounter (inform family/care partners about these prior to discharge). NEW docusate, clopidogrel , insulins, multivitamin, pantoprazole , tramadol , vitamin D Team to review with patient   Other       Clinically significant medication issues were identified that warrant physician communication and completion of prescribed/recommended actions by midnight of the next day:  Yes  Name of provider notified for urgent issues identified: Obed Bellows  Provider Method of Notification: Secure chat    Pharmacist comments: Clinically significant medication issue with pen needles was resolved per above. Orders entered.   Time spent performing this drug regimen review (minutes):  15  Dorene Gang, PharmD, [place], [facility] Clinical Pharmacist  Please check AMION for all [facility] Pharmacy phone numbers After 10:00 PM, call Main Pharmacy [phone]

## 2024-01-05 NOTE — Progress Notes (Signed)
 Physical Therapy Session Note  Patient Details  Name: Jaamal Farooqui MRN: [id number] Date of Birth: 04-Aug-1945  Today's Date: 01/05/2024 PT Individual Time: 0915-1026 PT Individual Time Calculation (min): 71 min   Short Term Goals: Week 2:  PT Short Term Goal 1 (Week 2): STG=LTG due to LOS  Skilled Therapeutic Interventions/Progress Updates:      Pt sitting EOB to start and in agreement to therapy session. Pt has no complaints of pain. Patient's wife present during session for active observation and for family training. Pt confirms DME equipment, follow up therapy, and general precautions following his BKA. Pt completing squat pivot transfer into w/c with wife assisting with setup for w/c management. Wife providing CGA for patient to complete transfer.   Pt taken to ortho gym with wife pushing patient at wheelchair level. Reviewed car transfer with car height set to lowest position to simulate their personal vehicle. Wife again, assisting pt with CGA for the transfer into the car. PT providing min cues for setup and general safety precautions.   Pt then transported to main gym and placed inside // bars. Focused remainder of session on sit<>stands, standing balance, standing exercises: -1x5 sit<>stands in // bars with patient pulling to stand. Patient initially requiring minA to stand but progressing to CGA with education on forward leaning and proper prosthetic placement for adequate BOS.  -2x2.5 minute stands with supervision in // bars. Standing time limited by fatigue.  -1x10 3-way hip with BUE support -1x20 physioball rolls in standing back/forth b/w PT with student PT providing CGA for balance  Pt returned to his room and left sitting up in w/c with wife present. Needs met and confident in DC plan.   Therapy Documentation Precautions:  Precautions Precautions: Fall Precaution/Restrictions Comments: wound vac to R LE Required Braces or Orthoses: Other Brace Other Brace: R LE limb  protector, L LE prosthesis Restrictions Weight Bearing Restrictions Per Provider Order: Yes RLE Weight Bearing Per Provider Order: Non weight bearing General:     Therapy/Group: Individual Therapy  Pheobe Brass 01/05/2024, 7:52 AM

## 2024-01-05 NOTE — Progress Notes (Signed)
 Physical Therapy Session Note  Patient Details  Name: Kevin Bradford MRN: [id number] Date of Birth: 1946-05-12  Today's Date: 01/05/2024 PT Individual Time: 1345-1455 PT Individual Time Calculation (min): 70 min   Short Term Goals: Week 2:  PT Short Term Goal 1 (Week 2): STG=LTG due to LOS  Skilled Therapeutic Interventions/Progress Updates: Pt presented in w/c agreeable to therapy. Pt denies pain during session. Session focused on general UE/LE therex as noted below. Pt required min cues for technique intermittently however tolerated well overall. Pt propelled to day room mod I and was able to manage all w/c parts without assist. Pt then completed lateral scoot transfer to mat with supervision and demonstrated overall good safety. Pt then completed the follow therex with intermittent rest breaks as needed.   Bicep curls 4lb weights 3 x 10 Overhead press 3lb dowel 3 x 10 Chest press 3lb dowel 3 x 10 Triceps extension from 1 yoga blocks 4 x 5 Ab twists with 4lb weighted ball Modified sit ups onto physioball 2 x 10 Supine  residual limb  SLR 3 x 10 Hip abd 3 x 10  Once completed pt completed lateral scoot transfer back to w/c in same manner as prior and propelled back to room. Pt was able to align w/c and set up w/c for transfer independently. Performed lateral scoot to EOB with supervision and doffed prosthetic mod I. Completed sit to supine with HOB elevated mod I. Pt left in bed at end of session with bed alarm on, call bell within reach and needs met.       Therapy Documentation Precautions:  Precautions Precautions: Fall Recall of Precautions/Restrictions: Intact Precaution/Restrictions Comments: wound vac to R LE Required Braces or Orthoses: Other Brace Other Brace: R LE limb protector, L LE prosthesis Restrictions Weight Bearing Restrictions Per Provider Order: Yes RLE Weight Bearing Per Provider Order: Non weight bearing Other Position/Activity Restrictions: New R  BKA General:   Vital Signs: Therapy Vitals Temp: 98.4 F (36.9 C) Temp Source: Oral Pulse Rate: 74 Resp: 17 BP: 120/76 Patient Position (if appropriate): Sitting Oxygen Therapy SpO2: 100 % O2 Device: Room Air Pain: Pain Assessment Pain Score: 0-No pain Mobility: Bed Mobility Bed Mobility: Rolling Right;Rolling Left;Sit to Supine;Supine to Sit Rolling Right: Independent with assistive device Rolling Left: Independent with assistive device Supine to Sit: Independent with assistive device Sit to Supine: Independent with assistive device Transfers Transfers: Lateral/Scoot Transfers Lateral/Scoot Transfers: Supervision/Verbal cueing Locomotion :    Trunk/Postural Assessment : Cervical Assessment Cervical Assessment: Exceptions to [facility] Thoracic Assessment Thoracic Assessment: Exceptions to [facility] Lumbar Assessment Lumbar Assessment: Exceptions to [facility] Postural Control Righting Reactions: delayed due to bilateral BKA (new R BKA) - improved since eval Protective Responses: delayed due to bilateral BKA (new R BKA) - improved since eval  Balance: Balance Balance Assessed: Yes Static Sitting Balance Static Sitting - Balance Support: Feet supported;Bilateral upper extremity supported Static Sitting - Level of Assistance: 7: Independent Dynamic Sitting Balance Dynamic Sitting - Balance Support: Feet supported;No upper extremity supported Dynamic Sitting - Level of Assistance: 6: Modified independent (Device/Increase time) Static Standing Balance Static Standing - Level of Assistance: 4: Min assist Exercises:   Other Treatments:      Therapy/Group: Individual Therapy  Shantale Holtmeyer 01/05/2024, 3:28 PM

## 2024-01-05 NOTE — Progress Notes (Signed)
 PROGRESS NOTE   Subjective/Complaints: No new complaints or concerns.  Looking forward to going home tomorrow.  ROS: Patient denies CP, SOB, N/V, abdominal pain + constipation -improved  + Insomnia- improved  Objective:    No results found. Recent Labs    01/05/24 0619  WBC 4.5  HGB 9.8*  HCT 30.3*  PLT 273    Recent Labs    01/05/24 0619  NA 139  K 4.1  CL 107  CO2 24  GLUCOSE 194*  BUN 13  CREATININE 1.08  CALCIUM  8.5*      Intake/Output Summary (Last 24 hours) at 01/05/2024 1655 Last data filed at 01/05/2024 1300 Gross per 24 hour  Intake 717 ml  Output 950 ml  Net -233 ml        Physical Exam: Vital Signs Blood pressure 120/76, pulse 74, temperature 98.4 F (36.9 C), temperature source Oral, resp. rate 17, height 4' 8.6 (1.438 m), weight 82.3 kg, SpO2 100%.  Constitutional: No distress . Vital signs reviewed.  General: No acute distress, sitting in WC, wife at bedside Mood and affect are appropriate Heart: Regular rate and rhythm no rubs murmurs or extra sounds Lungs: Clear to auscultation, breathing unlabored, no rales or wheezes Abdomen: Positive bowel sounds, soft nontender to palpation, nondistended Extremities: No clubbing, cyanosis, or edema Skin: No evidence of breakdown, no evidence of rash RIght BKA looks great, no drainage or erythema staples intact  Neuro:   Mental Status: AAOx3, memory intact Speech/Languate:  fluent, follows simple commands CRANIAL NERVES: 2-12 grossly intact MMT: 5/5 throughout except for R BKA   SENSORY: Normal to touch all 4 extremities Prior neuro assessment is c/w today's exam 01/05/2024.    Assessment/Plan: 1. Functional deficits which require 3+ hours per day of interdisciplinary therapy in a comprehensive inpatient rehab setting. Physiatrist is providing close team supervision and 24 hour management of active medical problems listed  below. Physiatrist and rehab team continue to assess barriers to discharge/monitor patient progress toward functional and medical goals  Care Tool:  Bathing    Body parts bathed by patient: Right arm, Left arm, Chest, Abdomen, Front perineal area, Right upper leg, Left upper leg, Face, Buttocks   Body parts bathed by helper: Buttocks Body parts n/a: Right lower leg, Left lower leg   Bathing assist Assist Level: Supervision/Verbal cueing     Upper Body Dressing/Undressing Upper body dressing   What is the patient wearing?: Pull over shirt    Upper body assist Assist Level: Set up assist    Lower Body Dressing/Undressing Lower body dressing      What is the patient wearing?: Pants, Underwear/pull up     Lower body assist Assist for lower body dressing: Supervision/Verbal cueing     Toileting Toileting    Toileting assist Assist for toileting: Minimal Assistance - Patient > 75%     Transfers Chair/bed transfer  Transfers assist  Chair/bed transfer activity did not occur: Safety/medical concerns  Chair/bed transfer assist level: Supervision/Verbal cueing     Locomotion Ambulation   Ambulation assist   Ambulation activity did not occur: Safety/medical concerns (fear, weakness, decreased balance)  Walk 10 feet activity   Assist  Walk 10 feet activity did not occur: Safety/medical concerns (fear, weakness, decreased balance)        Walk 50 feet activity   Assist Walk 50 feet with 2 turns activity did not occur: Safety/medical concerns (fear, weakness, decreased balance)         Walk 150 feet activity   Assist Walk 150 feet activity did not occur: Safety/medical concerns (fear, weakness, decreased balance)         Walk 10 feet on uneven surface  activity   Assist Walk 10 feet on uneven surfaces activity did not occur: Safety/medical concerns (fear, weakness, decreased balance)         Wheelchair     Assist Is the  patient using a wheelchair?: Yes Type of Wheelchair: Manual Wheelchair activity did not occur: Safety/medical concerns (weakness)  Wheelchair assist level: Supervision/Verbal cueing Max wheelchair distance: 150+    Wheelchair 50 feet with 2 turns activity    Assist    Wheelchair 50 feet with 2 turns activity did not occur: Safety/medical concerns (weakness)   Assist Level: Supervision/Verbal cueing   Wheelchair 150 feet activity     Assist  Wheelchair 150 feet activity did not occur: Safety/medical concerns (weakness)   Assist Level: Supervision/Verbal cueing   Blood pressure 120/76, pulse 74, temperature 98.4 F (36.9 C), temperature source Oral, resp. rate 17, height 4' 8.6 (1.438 m), weight 82.3 kg, SpO2 100%.   Medical Problem List and Plan: 1. 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  Wound VAC as directed             -patient may not shower due to wound vac             -ELOS/Goals: 7 days, Mod I to Sup with PT/OT            Grounds pass ordered  -Continue CIR therapies including PT, OT    - Asked to schedule for later sessions if possible   -expected discharge 6/21 - Tomorrow discussed discharge plan   2.  Antithrombotics: -DVT/anticoagulation:  Pharmaceutical: continue Lovenox              -antiplatelet therapy: Plavix  75 mg daily  3. Pain Management: Tramadol  50 mg every 6 hours  6/15 can change tramadol  to prn per pt's comments to RN 4. Mood/Behavior/Sleep: Melatonin 3 mg nightly.  Provide emotional support             -antipsychotic agents: N/A 5. Neuropsych/cognition: This patient is capable of making decisions on his own behalf. 6. Wound vac: messaged Dr. Julio Ohm to see if this can be removed  6/15 Pt has had wound vac for 11 days. Directions were to continue until discharge from the hospital.  -remove today, continue shrinker and dry dressing if needed -Follow-up with Dr. Julio Ohm office outpatient 7.  Fluids/Electrolytes/Nutrition: Routine in and outs with follow-up chemistries 8.  Acute blood loss anemia.  Follow-up CBC. HGB 9.6 on 6/9  -6/10 HGB stable 10.0 , monitor  Recheck tomorrow  9.  History of left BKA 2019.  Patient did receive CIR.  Patient does have a prosthesis.  Independent prior to admission with prosthesis 10.  Diabetes mellitus with peripheral neuropathy.  Hemoglobin A1c 12.4.  Decrease semglee  to 19U HS  CBG (last 3)  Recent Labs    01/04/24 2110 01/05/24 0628 01/05/24 1149  GLUCAP 292* 198* 132*  Off SSI  6/15 cbg's remain reasonably controll  6/17 CBGs with fair control, continue to monitor trend for now  6/18 Will add in correctional insulin , decrease lantus  to 17 to help reduce risk of AM hypoglycemia  6/19 add 2 unit mealtime novolog   6/20 Increase lantus  to 19u HS 11.  CKD stage II.  Follow-up chemistries. Cr 1.03 on 6/10, stable continue to monitor 12.  History of CVA.  Continue Plavix  13. Constipation            Pt had bm 6/13  -6/17 Sorbitol  60ml  -6/18 Bowel prep 1/2 container  -6/19 had a bowel movement yesterday, appears she had this without the additional medication  -6/20 Add miralax  to help prevent consitpation 14. Insomnia  -6/17 increase melatonin dose to 5mg  at bedtime   -6/19 decrease melatonin back to 3 mg per patient request  improved    LOS: 11 days A FACE TO FACE EVALUATION WAS PERFORMED  Lylia Sand 01/05/2024, 4:55 PM

## 2024-01-06 ENCOUNTER — Other Ambulatory Visit (HOSPITAL_COMMUNITY): Payer: Self-pay

## 2024-01-06 DIAGNOSIS — K5901 Slow transit constipation: Secondary | ICD-10-CM

## 2024-01-06 DIAGNOSIS — R739 Hyperglycemia, unspecified: Secondary | ICD-10-CM

## 2024-01-06 LAB — GLUCOSE, CAPILLARY: Glucose-Capillary: 200 mg/dL — ABNORMAL HIGH (ref 70–99)

## 2024-01-06 NOTE — Progress Notes (Signed)
 PROGRESS NOTE   Subjective/Complaints:  Pt doing fine, ready to go home, irritated that he couldn't leave at 5am. Denies pain, LBM yesterday (not documented), urinating fine, no other complaints or concerns.   ROS: Patient denies CP, SOB, N/V, abdominal pain + constipation -improved  + Insomnia- improved  Objective:    No results found. Recent Labs    01/05/24 0619  WBC 4.5  HGB 9.8*  HCT 30.3*  PLT 273    Recent Labs    01/05/24 0619  NA 139  K 4.1  CL 107  CO2 24  GLUCOSE 194*  BUN 13  CREATININE 1.08  CALCIUM  8.5*      Intake/Output Summary (Last 24 hours) at 01/06/2024 1032 Last data filed at 01/06/2024 0747 Gross per 24 hour  Intake 598 ml  Output 325 ml  Net 273 ml        Physical Exam: Vital Signs Blood pressure 126/70, pulse 64, temperature 99 F (37.2 C), temperature source Oral, resp. rate 20, height 4' 8.6 (1.438 m), weight 82.3 kg, SpO2 100%.   General: No acute distress, sitting in WC, wife at side Mood and affect are irritable Heart: Regular rate and rhythm no rubs murmurs or extra sounds Lungs: Clear to auscultation, breathing unlabored, no rales or wheezes Abdomen: Positive bowel sounds, soft nontender to palpation, nondistended Extremities: No clubbing, cyanosis, or edema Skin: No evidence of breakdown, no evidence of rash RIght BKA in limb guard   PRIOR EXAMS: Neuro:   Mental Status: AAOx3, memory intact Speech/Languate:  fluent, follows simple commands CRANIAL NERVES: 2-12 grossly intact MMT: 5/5 throughout except for R BKA   SENSORY: Normal to touch all 4 extremities Prior neuro assessment is c/w today's exam 01/06/2024.    Assessment/Plan: 1. Functional deficits which require 3+ hours per day of interdisciplinary therapy in a comprehensive inpatient rehab setting. Physiatrist is providing close team supervision and 24 hour management of active medical problems  listed below. Physiatrist and rehab team continue to assess barriers to discharge/monitor patient progress toward functional and medical goals  Care Tool:  Bathing    Body parts bathed by patient: Right arm, Left arm, Chest, Abdomen, Front perineal area, Right upper leg, Left upper leg, Face, Buttocks   Body parts bathed by helper: Buttocks Body parts n/a: Right lower leg, Left lower leg   Bathing assist Assist Level: Supervision/Verbal cueing     Upper Body Dressing/Undressing Upper body dressing   What is the patient wearing?: Pull over shirt    Upper body assist Assist Level: Set up assist    Lower Body Dressing/Undressing Lower body dressing      What is the patient wearing?: Pants, Underwear/pull up     Lower body assist Assist for lower body dressing: Supervision/Verbal cueing     Toileting Toileting    Toileting assist Assist for toileting: Minimal Assistance - Patient > 75%     Transfers Chair/bed transfer  Transfers assist  Chair/bed transfer activity did not occur: Safety/medical concerns  Chair/bed transfer assist level: Supervision/Verbal cueing     Locomotion Ambulation   Ambulation assist   Ambulation activity did not occur: Safety/medical concerns (fatigue, weakness, decreased balance)  Walk 10 feet activity   Assist  Walk 10 feet activity did not occur: Safety/medical concerns (fatigue, weakness, decreased balance)        Walk 50 feet activity   Assist Walk 50 feet with 2 turns activity did not occur: Safety/medical concerns (fatigue, weakness, decreased balance)         Walk 150 feet activity   Assist Walk 150 feet activity did not occur: Safety/medical concerns (fatigue, weakness, decreased balance)         Walk 10 feet on uneven surface  activity   Assist Walk 10 feet on uneven surfaces activity did not occur: Safety/medical concerns (fatigue, weakness, decreased balance)          Wheelchair     Assist Is the patient using a wheelchair?: Yes Type of Wheelchair: Manual Wheelchair activity did not occur: Safety/medical concerns (weakness)  Wheelchair assist level: Independent Max wheelchair distance: 150    Wheelchair 50 feet with 2 turns activity    Assist    Wheelchair 50 feet with 2 turns activity did not occur: Safety/medical concerns (weakness)   Assist Level: Independent   Wheelchair 150 feet activity     Assist  Wheelchair 150 feet activity did not occur: Safety/medical concerns (weakness)   Assist Level: Independent   Blood pressure 126/70, pulse 64, temperature 99 F (37.2 C), temperature source Oral, resp. rate 20, height 4' 8.6 (1.438 m), weight 82.3 kg, SpO2 100%.   Medical Problem List and Plan: 1. 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  Wound VAC as directed             -patient may not shower due to wound vac             -ELOS/Goals: 7 days, Mod I to Sup with PT/OT            Grounds pass ordered  -Continue CIR therapies including PT, OT    - Asked to schedule for later sessions if possible   -01/06/24 d/c home, meds reviewed.    2.  Antithrombotics: -DVT/anticoagulation:  Pharmaceutical: continue Lovenox              -antiplatelet therapy: Plavix  75 mg daily  3. Pain Management: Tramadol  50 mg every 6 hours  6/15 can change tramadol  to prn per pt's comments to RN 4. Mood/Behavior/Sleep: Melatonin 3 mg nightly.  Provide emotional support             -antipsychotic agents: N/A 5. Neuropsych/cognition: This patient is capable of making decisions on his own behalf. 6. Wound vac: messaged Dr. Harden to see if this can be removed  6/15 Pt has had wound vac for 11 days. Directions were to continue until discharge from the hospital.  -remove today, continue shrinker and dry dressing if needed -Follow-up with Dr. Harden office outpatient 7. Fluids/Electrolytes/Nutrition:  Routine in and outs with follow-up chemistries 8.  Acute blood loss anemia.  Follow-up CBC. HGB 9.6 on 6/9  -6/10 HGB stable 10.0 , monitor  Recheck tomorrow  9.  History of left BKA 2019.  Patient did receive CIR.  Patient does have a prosthesis.  Independent prior to admission with prosthesis 10.  Diabetes mellitus with peripheral neuropathy.  Hemoglobin A1c 12.4.  Decrease semglee  to 19U HS  CBG (last 3)  Recent Labs    01/05/24 1715 01/05/24 2134 01/06/24 0654  GLUCAP 161* 234* 200*  Off SSI  6/15 cbg's remain reasonably controll  6/17 CBGs with fair  control, continue to monitor trend for now  6/18 Will add in correctional insulin , decrease lantus  to 17 to help reduce risk of AM hypoglycemia  6/19 add 2 unit mealtime novolog   6/20 Increase lantus  to 19u HS  -01/06/24 CBGs overnight a bit better, cont dose as is, monitor outpatient 11.  CKD stage II.  Follow-up chemistries. Cr 1.03 on 6/10, stable continue to monitor 12.  History of CVA.  Continue Plavix  13. Constipation            Pt had bm 6/13  -6/17 Sorbitol  60ml  -6/18 Bowel prep 1/2 container  -6/19 had a bowel movement yesterday, appears she had this without the additional medication  -6/20 Add miralax  to help prevent consitpation  -01/06/24 reports BM yesterday, not documented; monitor outpatient 14. Insomnia  -6/17 increase melatonin dose to 5mg  at bedtime   -6/19 decrease melatonin back to 3 mg per patient request  improved    LOS: 12 days A FACE TO [facility] EVALUATION WAS PERFORMED  [id number] 01/06/2024, 10:32 AM

## 2024-01-08 ENCOUNTER — Telehealth: Payer: Self-pay

## 2024-01-08 NOTE — Telephone Encounter (Signed)
 Transition Care Management Unsuccessful Follow-up Telephone Call  Date of discharge and from where:  [facility]  Attempts:  2nd Attempt  Reason for unsuccessful TCM follow-up call:  Left voice message

## 2024-01-08 NOTE — Telephone Encounter (Signed)
 Transition Care Management Unsuccessful Follow-up Telephone Call  Date of discharge and from where:  [facility]  Attempts:  3rd Attempt  Reason for unsuccessful TCM follow-up call:  Left voice message

## 2024-01-08 NOTE — Telephone Encounter (Signed)
 Transition Care Management Unsuccessful Follow-up Telephone Call  Date of discharge and from where:  [facility]  Attempts:  1st Attempt  Reason for unsuccessful TCM follow-up call:  Unable to leave message

## 2024-01-09 ENCOUNTER — Telehealth: Payer: Self-pay

## 2024-01-09 NOTE — Telephone Encounter (Addendum)
 Jon RN  [phone]) with [facility] called yesterday:  Mr. Goto has delayed his start of in-the home care until today. Okay granted this morning for today's visit.

## 2024-01-13 ENCOUNTER — Encounter (HOSPITAL_COMMUNITY): Payer: Self-pay | Admitting: Interventional Radiology

## 2024-01-16 ENCOUNTER — Encounter: Payer: Self-pay | Admitting: Family

## 2024-01-16 ENCOUNTER — Ambulatory Visit (INDEPENDENT_AMBULATORY_CARE_PROVIDER_SITE_OTHER): Admitting: Family

## 2024-01-16 DIAGNOSIS — Z89512 Acquired absence of left leg below knee: Secondary | ICD-10-CM

## 2024-01-16 DIAGNOSIS — Z89511 Acquired absence of right leg below knee: Secondary | ICD-10-CM

## 2024-01-16 MED ORDER — SILVER SULFADIAZINE 1 % EX CREA: 1.0000 | TOPICAL_CREAM | Freq: Every day | CUTANEOUS | 0 refills | Status: AC

## 2024-01-16 NOTE — Addendum Note (Signed)
 Addended by: Nahomy Limburg R on: 01/16/2024 10:51 AM   Modules accepted: Orders

## 2024-01-16 NOTE — Progress Notes (Addendum)
 Post-Op Visit Note   Patient: Kevin Bradford           Date of Birth: Dec 16, 1945           MRN: [id number] Visit Date: 01/16/2024 PCP: Ransom Other, MD  Chief Complaint:  Chief Complaint  Patient presents with   Right Leg - Routine Post Op    12/20/2023 right BKA     HPI:  HPI The patient is a 78 year old gentleman seen status post right below-knee amputation June 4  Has been using a double extra-large shrinker  Patient is a new bilateral transtibial  amputee.  Patient's current comorbidities are not expected to impact the ability to function with the prescribed prosthesis. Patient verbally communicates a strong desire to use a prosthesis. Patient currently requires mobility aids to ambulate without a prosthesis.  Expects not to use mobility aids with a new prosthesis.  Patient is a K3 level ambulator that spends a lot of time walking around on uneven terrain over obstacles, up and down stairs, and ambulates with a variable cadence.  Patient is an existing left transtibial  amputee.  Patient's current comorbidities are not expected to impact the ability to function with the prescribed prosthesis. Patient verbally communicates a strong desire to use a prosthesis. Patient currently requires mobility aids to ambulate without a prosthesis.  Expects not to use mobility aids with a new prosthesis.  Patient is a K3 level ambulator that spends a lot of time walking around on uneven terrain over obstacles, up and down stairs, and ambulates with a variable cadence.     Ortho Exam On examination right residual limb this is consolidating and healing well staples are in place there is no gaping or drainage  Left residual limb is well consolidated well healed.  Visit Diagnoses: No diagnosis found.  Plan: Staples harvested today he will continue daily Dial soap cleansing.  Shrinker with direct skin contact.  Given an order for his prosthesis set up as well as supplies for his left  below-knee prosthesis  Follow-Up Instructions: No follow-ups on file.   Imaging: No results found.  Orders:  No orders of the defined types were placed in this encounter.  No orders of the defined types were placed in this encounter.    PMFS History: Patient Active Problem List   Diagnosis Date Noted   Coping style affecting medical condition 12/29/2023   Malnutrition of moderate degree 12/25/2023   Right below-knee amputee (HCC) 12/25/2023   Diabetic infection of right foot (HCC) 12/16/2023   History of stroke 12/16/2023   Acute osteomyelitis of right calcaneus (HCC) 12/16/2023   Cutaneous abscess of right foot 12/16/2023   DKA, type 2 (HCC) 12/08/2023   Hypokalemia 12/08/2023   S/P BKA (below knee amputation), left (HCC) 12/08/2023   Hyperosmolar hyperglycemic state (HHS) (HCC) 12/07/2023   Stroke (HCC)    Symptomatic bradycardia 05/06/2018   Postural dizziness with near syncope 05/06/2018   Labile blood glucose    Acute blood loss anemia    Labile blood pressure    CKD (chronic kidney disease) stage 2, GFR 60-89 ml/min    Type 2 diabetes mellitus with peripheral neuropathy (HCC)    Post-operative pain    Amputation of left lower extremity below knee upon examination (HCC) 11/24/2017   Amputated below knee, left (HCC) 11/22/2017   Osteomyelitis of left foot (HCC)    Dehiscence of amputation stump (HCC) 08/11/2017   S/P transmetatarsal amputation of foot, left (HCC) 07/19/2017   Subacute osteomyelitis,  left ankle and foot (HCC) 07/17/2017   Diabetic ulcer of left foot associated with type 2 diabetes mellitus (HCC)    Diabetes mellitus with renal complications (HCC) 10/17/2014   Diabetes (HCC) 10/13/2014   Normocytic anemia 10/13/2014   Hyperkalemia 10/13/2014   Past Medical History:  Diagnosis Date   Arthritis    joint pain (08/16/2017)   Below knee amputation (HCC)    Cellulitis and abscess of leg 10/18/2014   CKD (chronic kidney disease) stage 3, GFR 30-59  ml/min (HCC)    creatinine increases with antibiotics per pt, no known kidney disease per patient   Dehiscence of closure of skin, subsequent encounter    Diabetic foot ulcer (HCC)    left   Diabetic peripheral neuropathy (HCC)    GERD (gastroesophageal reflux disease)    12/26/2017- not this year.    Osteomyelitis (HCC)    left transmetatarsal    Stroke (HCC)    Type II diabetes mellitus (HCC)     Family History  Problem Relation Age of Onset   Dementia Mother     Past Surgical History:  Procedure Laterality Date   AMPUTATION Left 07/19/2017   Procedure: LEFT TRANSMETATARSAL AMPUTATION;  Surgeon: Harden Jerona GAILS, MD;  Location: [facility] OR;  Service: Orthopedics;  Laterality: Left;   AMPUTATION Left 08/16/2017   REVISION LEFT TRANSMETATARSAL AMPUTATION   AMPUTATION Left 11/22/2017   Procedure: LEFT BELOW KNEE AMPUTATION;  Surgeon: Harden Jerona GAILS, MD;  Location: [facility] OR;  Service: Orthopedics;  Laterality: Left;   AMPUTATION Right 12/20/2023   Procedure: AMPUTATION RIGHT BELOW KNEE;  Surgeon: Harden Jerona GAILS, MD;  Location: [facility] OR;  Service: Orthopedics;  Laterality: Right;   CATARACT EXTRACTION W/ INTRAOCULAR LENS  IMPLANT, BILATERAL  ~ 2016   EYE SURGERY Bilateral    catarct   I & D EXTREMITY Left 10/17/2014   Procedure: IRRIGATION AND DEBRIDEMENT EXTREMITY LEFT FOOT;  Surgeon: Jerona Harden GAILS, MD;  Location: [facility] OR;  Service: Orthopedics;  Laterality: Left;   IR ANGIO INTRA EXTRACRAN SEL COM CAROTID INNOMINATE BILAT MOD SED  05/07/2018   IR ANGIO VERTEBRAL SEL SUBCLAVIAN INNOMINATE BILAT MOD SED  05/07/2018   STUMP REVISION Left 08/16/2017   Procedure: REVISION LEFT TRANSMETATARSAL AMPUTATION;  Surgeon: Harden Jerona GAILS, MD;  Location: [facility] OR;  Service: Orthopedics;  Laterality: Left;   STUMP REVISION Left 12/27/2017   Procedure: LEFT BELOW KNEE AMPUTATION REVISION;  Surgeon: Harden Jerona GAILS, MD;  Location: [facility] OR;  Service: Orthopedics;  Laterality: Left;   TOE AMPUTATION Right 2013   5th toe    Social History   Occupational History   Not on file  Tobacco Use   Smoking status: Never   Smokeless tobacco: Never  Vaping Use   Vaping status: Never Used  Substance and Sexual Activity   Alcohol use: Yes    Alcohol/week: 1.0 standard drink of alcohol    Types: 1 Cans of beer per week    Comment:  maybe 1 beer 2 times a month-12/26/2017   Drug use: No   Sexual activity: Yes

## 2024-01-22 ENCOUNTER — Encounter: Attending: Registered Nurse | Admitting: Registered Nurse

## 2024-01-22 VITALS — BP 118/70 | HR 83 | Ht 74.0 in | Wt 184.0 lb

## 2024-01-22 DIAGNOSIS — Z89512 Acquired absence of left leg below knee: Secondary | ICD-10-CM

## 2024-01-22 DIAGNOSIS — Z89511 Acquired absence of right leg below knee: Secondary | ICD-10-CM | POA: Diagnosis not present

## 2024-01-22 NOTE — Progress Notes (Signed)
 Subjective:    Patient ID: Kevin Bradford, male    DOB: Apr 23, 1946, 78 y.o.   MRN: [id number]  HPI: Kevin Bradford is a 78 y.o. male who is here for HFU appointment for F/U of his Right Below knee amputation and Type 2 DM on insulin . He presented to [facility] on 11/28/2023, with right foot infection/ Abscess.  Maurilio Collet PA- C H&P: Chief Complaint: Abscess and osteomyelitis right hindfoot.  HPI: Kevin Bradford is a 78 y.o. male who presents with foul-smelling ulcer right heel.  Patient has recently been hospitalized.  Hemoglobin A1c greater than 13.  Orthopedic Consulted: He underwent: on 12/20/2023;  Dr. Harden  AMPUTATION RIGHT BELOW KNEE   He was admitted to inpatient Rehabilitation on 12/25/2023 and discharged home on 01/06/2024. He is receiving Home Health Therapy from [facility]. He denies any pain. He rates his pain 0.  Reports he has a good appetite and following his diabetic Diet with good blood sugars at home he reports.   Pain Inventory Average Pain 0 Pain Right Now 0 My pain is .  In the last 24 hours, has pain interfered with the following? General activity 0 Relation with others 0 Enjoyment of life 0 What TIME of day is your pain at its worst? . Sleep (in general) Good  Pain is worse with: na Pain improves with: na Relief from Meds: .  ability to climb steps?  no do you drive?  no use a wheelchair  retired  [employer]  Hospital f/u  Hospital f/u    Family History  Problem Relation Age of Onset   Dementia Mother    Social History   Socioeconomic History   Marital status: Married    Spouse name: Not on file   Number of children: Not on file   Years of education: Not on file   Highest education level: Not on file  Occupational History   Not on file  Tobacco Use   Smoking status: Never   Smokeless tobacco: Never  Vaping Use   Vaping status: Never Used  Substance and Sexual Activity   Alcohol use: Yes    Alcohol/week: 1.0  standard drink of alcohol    Types: 1 Cans of beer per week    Comment:  maybe 1 beer 2 times a month-12/26/2017   Drug use: No   Sexual activity: Yes  Other Topics Concern   Not on file  Social History Narrative   Not on file   Social Drivers of Health   Financial Resource Strain: Low Risk  (05/06/2018)   Overall Financial Resource Strain (CARDIA)    Difficulty of Paying Living Expenses: Not hard at all  Food Insecurity: No Food Insecurity (12/18/2023)   Hunger Vital Sign    Worried About Running Out of Food in the Last Year: Never true    Ran Out of Food in the Last Year: Never true  Transportation Needs: Unmet Transportation Needs (12/18/2023)   PRAPARE - Transportation    Lack of Transportation (Medical): No    Lack of Transportation (Non-Medical): Yes  Physical Activity: Sufficiently Active (05/06/2018)   Exercise Vital Sign    Days of Exercise per Week: 5 days    Minutes of Exercise per Session: 120 min  Stress: No Stress Concern Present (05/06/2018)   [employer] of Occupational Health - Occupational Stress Questionnaire    Feeling of Stress : Not at all  Social Connections: Moderately Isolated (12/19/2023)   Social Connection and Isolation  Panel    Frequency of Communication with Friends and Family: Three times a week    Frequency of Social Gatherings with Friends and Family: Three times a week    Attends Religious Services: Never    Active Member of Clubs or Organizations: No    Attends Banker Meetings: Never    Marital Status: Married   Past Surgical History:  Procedure Laterality Date   AMPUTATION Left 07/19/2017   Procedure: LEFT TRANSMETATARSAL AMPUTATION;  Surgeon: Harden Kevin GAILS, MD;  Location[facility] OR;  Service: Orthopedics;  Laterality: Left;   AMPUTATION Left 08/16/2017   REVISION LEFT TRANSMETATARSAL AMPUTATION   AMPUTATION Left 11/22/2017   Procedure: LEFT BELOW KNEE AMPUTATION;  Surgeon: Harden Kevin GAILS, MD;  Location: [facility] OR;  Service:  Orthopedics;  Laterality: Left;   AMPUTATION Right 12/20/2023   Procedure: AMPUTATION RIGHT BELOW KNEE;  Surgeon: Harden Kevin GAILS, MD;  Location: [facility] OR;  Service: Orthopedics;  Laterality: Right;   CATARACT EXTRACTION W/ INTRAOCULAR LENS  IMPLANT, BILATERAL  ~ 2016   EYE SURGERY Bilateral    catarct   I & D EXTREMITY Left 10/17/2014   Procedure: IRRIGATION AND DEBRIDEMENT EXTREMITY LEFT FOOT;  Surgeon: Kevin Harden GAILS, MD;  Location: [facility] OR;  Service: Orthopedics;  Laterality: Left;   IR ANGIO INTRA EXTRACRAN SEL COM CAROTID INNOMINATE BILAT MOD SED  05/07/2018   IR ANGIO VERTEBRAL SEL SUBCLAVIAN INNOMINATE BILAT MOD SED  05/07/2018   STUMP REVISION Left 08/16/2017   Procedure: REVISION LEFT TRANSMETATARSAL AMPUTATION;  Surgeon: Harden Kevin GAILS, MD;  Location: [facility] OR;  Service: Orthopedics;  Laterality: Left;   STUMP REVISION Left 12/27/2017   Procedure: LEFT BELOW KNEE AMPUTATION REVISION;  Surgeon: Harden Kevin GAILS, MD;  Location: [facility] OR;  Service: Orthopedics;  Laterality: Left;   TOE AMPUTATION Right 2013   5th toe   Past Medical History:  Diagnosis Date   Arthritis    joint pain (08/16/2017)   Below knee amputation (HCC)    Cellulitis and abscess of leg 10/18/2014   CKD (chronic kidney disease) stage 3, GFR 30-59 ml/min (HCC)    creatinine increases with antibiotics per pt, no known kidney disease per patient   Dehiscence of closure of skin, subsequent encounter    Diabetic foot ulcer (HCC)    left   Diabetic peripheral neuropathy (HCC)    GERD (gastroesophageal reflux disease)    12/26/2017- not this year.    Osteomyelitis (HCC)    left transmetatarsal    Stroke (HCC)    Type II diabetes mellitus (HCC)    BP 118/70 (BP Location: Left Arm, Patient Position: Sitting, Cuff Size: Normal)   Pulse 83   Ht 6' 2 (1.88 m)   Wt 184 lb (83.5 kg)   SpO2 97%   BMI 23.62 kg/m   Opioid Risk Score:   Fall Risk Score:  `1  Depression screen PHQ 2/9     05/22/2018    2:00 PM 02/21/2018   10:04  AM  Depression screen PHQ 2/9  Decreased Interest 0 0  Down, Depressed, Hopeless 0 0  PHQ - 2 Score 0 0     Review of Systems     Objective:   Physical Exam Vitals and nursing note reviewed.  Constitutional:      Appearance: Normal appearance.  Cardiovascular:     Rate and Rhythm: Normal rate and regular rhythm.     Pulses: Normal pulses.     Heart sounds: Normal heart sounds.  Pulmonary:     Effort: Pulmonary effort is normal.     Breath sounds: Normal breath sounds.  Musculoskeletal:     Comments: Normal Muscle Bulk and Muscle Testing Reveals:  Upper Extremities: Full ROM and Muscle Strength 5/5  Lower Extremities: Right: BKA: Suture Intact: No drainage  noted: Left BKA: Wearing Prosthesis  Arrived in wheelchair     Skin:    General: Skin is warm and dry.  Neurological:     Mental Status: He is alert and oriented to person, place, and time.  Psychiatric:        Mood and Affect: Mood normal.        Behavior: Behavior normal.          Assessment & Plan:  Right Below knee amputation: Dr Harden Following> Continue Home Health Therapy with [facility]  2.  Type 2 DM on insulin : Continue current medication regimen: PCP Following. Continue to Monitor.  3. History of Left BKA: Wearing Prosthesis   F/U with Dr Lorilee in 4-  6 weeks

## 2024-01-28 ENCOUNTER — Encounter: Payer: Self-pay | Admitting: Registered Nurse

## 2024-02-28 ENCOUNTER — Other Ambulatory Visit: Payer: Self-pay | Admitting: Orthopedic Surgery

## 2024-02-28 ENCOUNTER — Telehealth: Payer: Self-pay | Admitting: Orthopedic Surgery

## 2024-02-28 DIAGNOSIS — Z89511 Acquired absence of right leg below knee: Secondary | ICD-10-CM

## 2024-02-28 NOTE — Telephone Encounter (Signed)
 Pt called stating that he will be getting his new leg in 10 days and would like for Harden to go ahead and send referral for physical therapy with Robin Waldon at [facility]. Please call pt at [phone].

## 2024-02-28 NOTE — Telephone Encounter (Signed)
 LM ON VM per DPR okay

## 2024-02-28 NOTE — Telephone Encounter (Signed)
 Referral placed for PT upstairs.

## 2024-03-01 ENCOUNTER — Encounter: Admitting: Physical Medicine and Rehabilitation

## 2024-03-05 ENCOUNTER — Telehealth: Payer: Self-pay

## 2024-03-05 NOTE — Telephone Encounter (Signed)
 Patient called wanting to see if Dr. Harden has a recommendation for a PCP that would be taking care of a diabetic.  Please call and advise  [phone]

## 2024-03-20 ENCOUNTER — Encounter: Payer: Self-pay | Admitting: Physical Therapy

## 2024-03-20 ENCOUNTER — Ambulatory Visit (INDEPENDENT_AMBULATORY_CARE_PROVIDER_SITE_OTHER): Admitting: Physical Therapy

## 2024-03-20 DIAGNOSIS — M25661 Stiffness of right knee, not elsewhere classified: Secondary | ICD-10-CM

## 2024-03-20 DIAGNOSIS — R2681 Unsteadiness on feet: Secondary | ICD-10-CM | POA: Diagnosis not present

## 2024-03-20 DIAGNOSIS — M6281 Muscle weakness (generalized): Secondary | ICD-10-CM

## 2024-03-20 DIAGNOSIS — R2689 Other abnormalities of gait and mobility: Secondary | ICD-10-CM

## 2024-03-20 NOTE — Therapy (Addendum)
 OUTPATIENT PHYSICAL THERAPY PROSTHETIC EVALUATION   Patient Name: Kevin Bradford MRN: [id number] DOB:January 18, 1946, 78 y.o., male Today's Date: 03/20/2024  END OF SESSION:  PT End of Session - 03/20/24 1619     Visit Number 1    Number of Visits 24    Date for PT Re-Evaluation 06/18/24    Authorization Type [employer] MEDICARE    Progress Note Due on Visit 10    PT Start Time 1517    PT Stop Time 1615    PT Time Calculation (min) 58 min    Equipment Utilized During Treatment Gait belt    Activity Tolerance Patient tolerated treatment well    Behavior During Therapy WFL for tasks assessed/performed          Past Medical History:  Diagnosis Date   Arthritis    joint pain (08/16/2017)   Below knee amputation (HCC)    Cellulitis and abscess of leg 10/18/2014   CKD (chronic kidney disease) stage 3, GFR 30-59 ml/min (HCC)    creatinine increases with antibiotics per pt, no known kidney disease per patient   Dehiscence of closure of skin, subsequent encounter    Diabetic foot ulcer (HCC)    left   Diabetic peripheral neuropathy (HCC)    GERD (gastroesophageal reflux disease)    12/26/2017- not this year.    Osteomyelitis (HCC)    left transmetatarsal    Stroke (HCC)    Type II diabetes mellitus [facility])    Past Surgical History:  Procedure Laterality Date   AMPUTATION Left 07/19/2017   Procedure: LEFT TRANSMETATARSAL AMPUTATION;  Surgeon: Harden Jerona GAILS, MD;  Location: [facility] OR;  Service: Orthopedics;  Laterality: Left;   AMPUTATION Left 08/16/2017   REVISION LEFT TRANSMETATARSAL AMPUTATION   AMPUTATION Left 11/22/2017   Procedure: LEFT BELOW KNEE AMPUTATION;  Surgeon: Harden Jerona GAILS, MD;  Location: [facility] OR;  Service: Orthopedics;  Laterality: Left;   AMPUTATION Right 12/20/2023   Procedure: AMPUTATION RIGHT BELOW KNEE;  Surgeon: Harden Jerona GAILS, MD;  Location: [facility] OR;  Service: Orthopedics;  Laterality: Right;   CATARACT EXTRACTION W/ INTRAOCULAR LENS  IMPLANT, BILATERAL  ~ 2016   EYE  SURGERY Bilateral    catarct   I & D EXTREMITY Left 10/17/2014   Procedure: IRRIGATION AND DEBRIDEMENT EXTREMITY LEFT FOOT;  Surgeon: Jerona Harden GAILS, MD;  Location: [facility] OR;  Service: Orthopedics;  Laterality: Left;   IR ANGIO INTRA EXTRACRAN SEL COM CAROTID INNOMINATE BILAT MOD SED  05/07/2018   IR ANGIO VERTEBRAL SEL SUBCLAVIAN INNOMINATE BILAT MOD SED  05/07/2018   STUMP REVISION Left 08/16/2017   Procedure: REVISION LEFT TRANSMETATARSAL AMPUTATION;  Surgeon: Harden Jerona GAILS, MD;  Location: [facility] OR;  Service: Orthopedics;  Laterality: Left;   STUMP REVISION Left 12/27/2017   Procedure: LEFT BELOW KNEE AMPUTATION REVISION;  Surgeon: Harden Jerona GAILS, MD;  Location: [facility] OR;  Service: Orthopedics;  Laterality: Left;   TOE AMPUTATION Right 2013   5th toe   Patient Active Problem List   Diagnosis Date Noted   Coping style affecting medical condition 12/29/2023   Malnutrition of moderate degree 12/25/2023   Right below-knee amputee (HCC) 12/25/2023   Diabetic infection of right foot (HCC) 12/16/2023   History of stroke 12/16/2023   Acute osteomyelitis of right calcaneus (HCC) 12/16/2023   Cutaneous abscess of right foot 12/16/2023   DKA, type 2 (HCC) 12/08/2023   Hypokalemia 12/08/2023   S/P BKA (below knee amputation), left (HCC) 12/08/2023   Hyperosmolar hyperglycemic state (HHS) (  HCC) 12/07/2023   Stroke (HCC)    Symptomatic bradycardia 05/06/2018   Postural dizziness with near syncope 05/06/2018   Labile blood glucose    Acute blood loss anemia    Labile blood pressure    CKD (chronic kidney disease) stage 2, GFR 60-89 ml/min    Type 2 diabetes mellitus with peripheral neuropathy (HCC)    Post-operative pain    Amputation of left lower extremity below knee upon examination (HCC) 11/24/2017   Amputated below knee, left (HCC) 11/22/2017   Osteomyelitis of left foot (HCC)    Dehiscence of amputation stump (HCC) 08/11/2017   S/P transmetatarsal amputation of foot, left (HCC) 07/19/2017    Subacute osteomyelitis, left ankle and foot (HCC) 07/17/2017   Diabetic ulcer of left foot associated with type 2 diabetes mellitus (HCC)    Diabetes mellitus with renal complications (HCC) 10/17/2014   Diabetes (HCC) 10/13/2014   Normocytic anemia 10/13/2014   Hyperkalemia 10/13/2014    PCP: Ransom Other, MD  REFERRING PROVIDER: Harden Jerona GAILS, MD  ONSET DATE: prosthesis delivery: 03/15/2024  REFERRING DIAG: S10.488 (ICD-10-CM) - Right below-knee amputee (HCC)   THERAPY DIAG:  Other abnormalities of gait and mobility  Muscle weakness (generalized)  Stiffness of right knee, not elsewhere classified  Unsteadiness on feet  Rationale for Evaluation and Treatment: Rehabilitation  SUBJECTIVE:   SUBJECTIVE STATEMENT: Patient is a 78 y.o. male who arrives to PT session with new Rt BKA (12/20/2023) and prostheses (first RLE & new LLE) delivery date 03/15/2024. Patient has a history of previous Lt BKA in 2019. Patient reports being able to stand at sink with UE support. Patient has new prosthesis with vacuum pin suction on both LEs which is new system that he is not familiar with. Patient has progressed wear of Rt prosthesis from 1 hour 2x/day up to 3 hrs, 2x/ day with minimal discomfort.  Pt accompanied by: significant other  PERTINENT HISTORY:  Right TTA 12/20/23, L TTA 2019, DM2, CKD3, PVD, arthritis, peripheral neuropathy, CVA,   PAIN:  Are you having pain? No  PRECAUTIONS: None  WEIGHT BEARING RESTRICTIONS: No  FALLS: Has patient fallen in last 6 months? No  LIVING ENVIRONMENT: Lives with: lives with their spouse Lives in: House Home Access: Ramped entrance or by garage which is one step Home layout: Two level, 1/2 bath on main level (not accessible with w/c), and Bed/bath upstairs Stairs: Yes: Internal: 16 steps; on left going up Has following equipment at home: Single point cane, Environmental consultant - 2 wheeled, Wheelchair (manual), shower chair, and Tour manager  OCCUPATION:  retired, works as Art therapist on occasion  PLOF: modified independence  PATIENT GOALS: function with both prostheses at community level, doing yard Corporate treasurer   MD appt: follow up 04/30/2024   OBJECTIVE:  Patient-Specific Activity Scoring Scheme  0 represents "unable to perform." 10 represents "able to perform at prior level. 0 1 2 3 4 5 6 7 8 9  10 (Date and Score)  Activity Eval     1.  walking 0     2. standing ADLs  0    3. drive 0   4.stairs  0   5. Yardwork/handyman work 0   Score 0    Total score = sum of the activity scores/number of activities Minimum detectable change (90%CI) for average score = 2 points Minimum detectable change (90%CI) for single activity score = 3 points  COGNITION: Overall cognitive status: Within functional limits for tasks assessed   SENSATION: [facility]  POSTURE: rounded shoulders, forward head, flexed trunk , and weight shift left  LOWER EXTREMITY ROM:  ROM P:passive  A:active Right eval Left eval  Hip flexion    Hip extension    Hip abduction    Hip adduction    Hip internal rotation    Hip external rotation    Knee flexion    Knee extension Seated  P: -5* Seated  P: 0*  Ankle dorsiflexion    Ankle plantarflexion    Ankle inversion    Ankle eversion     (Blank rows = not tested)  LOWER EXTREMITY MMT:  MMT Right eval Left eval  Hip flexion    Hip extension 3-/5 4-/5  Hip abduction 3/5 4-/5  Hip adduction    Hip internal rotation    Hip external rotation    Knee flexion 3/5   Knee extension 3/5   At Evaluation all strength testing is grossly seated and functionally standing / gait. (Blank rows = not tested)  TRANSFERS: Sit to stand: Mod A, dependent on heavy BUE support 18 chair with armrests, use RW to stabilize to stand upright Stand to sit: Min A with BUE support on armrests to 18 chair, requires RW for stability Lateral scoot transfers: SBA with BUE support and 2 chairs touching same  height  FUNCTIONAL TESTs:  Pt requires minA with heavy BUE support on RW for static standing balance.   GAIT: Gait pattern: step to pattern, decreased step length- Right, decreased step length- Left, decreased stance time- Right, decreased hip/knee flexion- Right, Right hip hike, knee flexed in stance- Right, lateral hip instability, trunk flexed, and wide BOS Distance walked: 22' Assistive device utilized: Environmental consultant - 2 wheeled Level of assistance: Mod A   CURRENT PROSTHETIC WEAR ASSESSMENT: 03/20/2024  Patient is dependent with: skin check, residual limb care, prosthetic cleaning, ply sock cleaning, correct ply sock adjustment, proper wear schedule/adjustment, and proper weight-bearing schedule/adjustment Donning prosthesis: Mod A Doffing prosthesis: Mod A Prosthetic wear tolerance: progressed up to 3 hours, 2x/day for last day,  he has worn right prosthesis daily since delivery 6 days ago.  He is wearing Left prosthesis all awake hours.  Prosthetic weight bearing tolerance: pt tolerated standing & gait with RW support for 5 min total with no c/o limb pain.  Edema: RLE pitting with 5-second capillary refill.  None noted in LLE. Residual limb condition:   Right: bone length 7, limb length 8, scar healed, no hair growth, normal skin color and temperature, 1 medial wound 2mm which is clean with no signs of infection, cylindrical shape  Left: conical shape, shrunk with bony landmarks prominent, dark & callused pressure areas at fibula head, tibial tuberosity and lateral tibial plateau.  Prosthetic description: total contact socket with backing pin suction, dynamic response feet    TODAY'S TREATMENT:  DATE:  03/20/2024 Prosthetic Care with BKA prosthesis: See patient education below.  PATIENT EDUCATION: PATIENT EDUCATED ON FOLLOWING PROSTHETIC CARE: Education details:  Alterations required long pants height modification for safety as prosthetist decreased his overall height by approximately 2 inches, shrinker use when prosthesis is off,  Skin check, Residual limb care, Prosthetic cleaning, Correct ply sock adjustment, Propper donning, Proper doffing, Proper wear schedule/adjustment, and Proper weight-bearing schedule/adjustment Prosthetic wear tolerance: 3 hours 2x/day, 7 days/week.  If after 5 days no unusual skin changes or pressure intolerance noted then can increase to 4 hours 2x/day Person educated: Patient and Spouse Education method: Explanation, Demonstration, Tactile cues, and Verbal cues Education comprehension: verbalized understanding, verbal cues required, tactile cues required, and needs further education  HOME EXERCISE PROGRAM:  ASSESSMENT:  CLINICAL IMPRESSION: Patient is a 78 y.o. male who was seen today for physical therapy evaluation and treatment for prosthetic training with Rt BKA. Patient has bilateral BKA and has difficulty with gait, standing balance, and transfers. Additionally, patient requires additional prosthetic care education for healthy residual limb.  Patient has limited wear of right prosthesis which limits function and safety.  Patient is very high fall risk and is dependent on bilateral upper extremity support of rolling walker with assistance for all activities.  Patient will continue to benefit from skilled PT to address impairments and gain independence.   OBJECTIVE IMPAIRMENTS: Abnormal gait, decreased activity tolerance, decreased balance, decreased coordination, decreased endurance, decreased knowledge of use of DME, decreased mobility, difficulty walking, decreased ROM, decreased strength, impaired flexibility, improper body mechanics, postural dysfunction, and prosthetic dependency .   ACTIVITY LIMITATIONS: carrying, lifting, bending, sitting, standing, squatting, stairs, transfers, bathing, toileting, dressing, reach over  head, hygiene/grooming, and locomotion level  PARTICIPATION LIMITATIONS: meal prep, cleaning, driving, shopping, community activity, occupation, and yard work  PERSONAL FACTORS: Age, Fitness, Past/current experiences, Time since onset of injury/illness/exacerbation, and 3+ comorbidities: see PMH are also affecting patient's functional outcome.   REHAB POTENTIAL: Good  CLINICAL DECISION MAKING: Evolving/moderate complexity  EVALUATION COMPLEXITY: Moderate   GOALS: Goals reviewed with patient? Yes  SHORT TERM GOALS: Target date: 04/19/2024  Patient donnes prosthesis modified independent & verbalizes proper cleaning. Baseline: SEE OBJECTIVE DATA Goal status: INITIAL 2.  Patient tolerates right prosthesis >10 hrs total /day without skin issues or limb pain. Baseline: SEE OBJECTIVE DATA Goal status: INITIAL  3.  Patient able to reach 7 and look over both shoulders with RW support with supervision. Baseline: SEE OBJECTIVE DATA Goal status: INITIAL  4. Patient ambulates 67' with RW & prosthesis with CGA. Baseline: SEE OBJECTIVE DATA Goal status: INITIAL  5. Patient transfers sit to/from stand 18 chair with armrest to RW with CGA.  Baseline: SEE OBJECTIVE DATA Goal status: INITIAL  LONG TERM GOALS: Target date: 06/18/2024  Patient demonstrates & verbalized understanding of prosthetic care to enable safe utilization of prostheses. Baseline: SEE OBJECTIVE DATA Goal status: INITIAL  Patient tolerates prostheses wear >90% of awake hours without skin or limb pain issues. Baseline: SEE OBJECTIVE DATA Goal status: INITIAL  Patient ambulates >500' with LRAD and prostheses independently Baseline: SEE OBJECTIVE DATA Goal status: INITIAL  Patient negotiates ramps, curbs & stairs with single rail with prostheses with LRAD independently. Baseline: SEE OBJECTIVE DATA Goal status: INITIAL  Patient demonstrates & verbalizes lifting, carrying, pushing, pulling with prostheses only  safely. Baseline: SEE OBJECTIVE DATA Goal status: INITIAL  6.  Patient reports Patient-Specific Activity Score improved the average >/= 6 to indicate improvement in functional activities.  Baseline: SEE OBJECTIVE DATA Goal status: INITIAL  PLAN:  PT FREQUENCY: 2x/week  PT DURATION: 12 weeks  PLANNED INTERVENTIONS: 97164- PT Re-evaluation, 97750- Physical Performance Testing, 97110-Therapeutic exercises, 97530- Therapeutic activity, 97112- Neuromuscular re-education, 97535- Self Care, [id number]- Manual therapy, [phone]- Gait training, [phone]- Prosthetic Initial , [phone]- Orthotic/Prosthetic subsequent, Patient/Family education, Balance training, Stair training, Scar mobilization, and DME instructions  PLAN FOR NEXT SESSION:    assess task of Berg with RW and set balance LTG,  review prosthetic care & progress wear, begin prosthetic gait with RW   Ismael Nap, Student-PT 03/20/2024, 4:50 PM   This entire session of physical therapy was performed under the direct supervision of PT signing evaluation /treatment. PT reviewed note and agrees.   Grayce Spatz, PT, DPT 03/20/2024, 5:28 PM  Referring diagnosis? Z89.511 (ICD-10-CM) - Right below-knee amputee (has history left BKA so now Bil. BKAs) Treatment diagnosis? (if different than referring diagnosis) R26.89, M62.81, M25.661, R26.81 What was this (referring dx) caused by? [x]  Surgery []  Fall []  Ongoing issue []  Arthritis []  Other: ____________  Laterality: []  Rt []  Lt [x]  Both  Check all possible CPT codes:  *CHOOSE 10 OR LESS*    See Planned Interventions listed in the Plan section of the Evaluation.

## 2024-03-26 NOTE — Therapy (Signed)
 OUTPATIENT PHYSICAL THERAPY PROSTHETIC EVALUATION   Patient Name: Kevin Bradford MRN: [id number] DOB:Sep 12, 1945, 78 y.o., male Today's Date: 03/26/2024  END OF SESSION:    Past Medical History:  Diagnosis Date   Arthritis    joint pain (08/16/2017)   Below knee amputation (HCC)    Cellulitis and abscess of leg 10/18/2014   CKD (chronic kidney disease) stage 3, GFR 30-59 ml/min (HCC)    creatinine increases with antibiotics per pt, no known kidney disease per patient   Dehiscence of closure of skin, subsequent encounter    Diabetic foot ulcer (HCC)    left   Diabetic peripheral neuropathy (HCC)    GERD (gastroesophageal reflux disease)    12/26/2017- not this year.    Osteomyelitis (HCC)    left transmetatarsal    Stroke (HCC)    Type II diabetes mellitus [facility])    Past Surgical History:  Procedure Laterality Date   AMPUTATION Left 07/19/2017   Procedure: LEFT TRANSMETATARSAL AMPUTATION;  Surgeon: Harden Jerona GAILS, MD;  Location: [facility] OR;  Service: Orthopedics;  Laterality: Left;   AMPUTATION Left 08/16/2017   REVISION LEFT TRANSMETATARSAL AMPUTATION   AMPUTATION Left 11/22/2017   Procedure: LEFT BELOW KNEE AMPUTATION;  Surgeon: Harden Jerona GAILS, MD;  Location: [facility] OR;  Service: Orthopedics;  Laterality: Left;   AMPUTATION Right 12/20/2023   Procedure: AMPUTATION RIGHT BELOW KNEE;  Surgeon: Harden Jerona GAILS, MD;  Location: [facility] OR;  Service: Orthopedics;  Laterality: Right;   CATARACT EXTRACTION W/ INTRAOCULAR LENS  IMPLANT, BILATERAL  ~ 2016   EYE SURGERY Bilateral    catarct   I & D EXTREMITY Left 10/17/2014   Procedure: IRRIGATION AND DEBRIDEMENT EXTREMITY LEFT FOOT;  Surgeon: Jerona Harden GAILS, MD;  Location: [facility] OR;  Service: Orthopedics;  Laterality: Left;   IR ANGIO INTRA EXTRACRAN SEL COM CAROTID INNOMINATE BILAT MOD SED  05/07/2018   IR ANGIO VERTEBRAL SEL SUBCLAVIAN INNOMINATE BILAT MOD SED  05/07/2018   STUMP REVISION Left 08/16/2017   Procedure: REVISION LEFT TRANSMETATARSAL  AMPUTATION;  Surgeon: Harden Jerona GAILS, MD;  Location: [facility] OR;  Service: Orthopedics;  Laterality: Left;   STUMP REVISION Left 12/27/2017   Procedure: LEFT BELOW KNEE AMPUTATION REVISION;  Surgeon: Harden Jerona GAILS, MD;  Location: [facility] OR;  Service: Orthopedics;  Laterality: Left;   TOE AMPUTATION Right 2013   5th toe   Patient Active Problem List   Diagnosis Date Noted   Coping style affecting medical condition 12/29/2023   Malnutrition of moderate degree 12/25/2023   Right below-knee amputee (HCC) 12/25/2023   Diabetic infection of right foot (HCC) 12/16/2023   History of stroke 12/16/2023   Acute osteomyelitis of right calcaneus (HCC) 12/16/2023   Cutaneous abscess of right foot 12/16/2023   DKA, type 2 (HCC) 12/08/2023   Hypokalemia 12/08/2023   S/P BKA (below knee amputation), left (HCC) 12/08/2023   Hyperosmolar hyperglycemic state (HHS) (HCC) 12/07/2023   Stroke (HCC)    Symptomatic bradycardia 05/06/2018   Postural dizziness with near syncope 05/06/2018   Labile blood glucose    Acute blood loss anemia    Labile blood pressure    CKD (chronic kidney disease) stage 2, GFR 60-89 ml/min    Type 2 diabetes mellitus with peripheral neuropathy (HCC)    Post-operative pain    Amputation of left lower extremity below knee upon examination (HCC) 11/24/2017   Amputated below knee, left (HCC) 11/22/2017   Osteomyelitis of left foot (HCC)    Dehiscence of amputation stump (HCC)  08/11/2017   S/P transmetatarsal amputation of foot, left (HCC) 07/19/2017   Subacute osteomyelitis, left ankle and foot (HCC) 07/17/2017   Diabetic ulcer of left foot associated with type 2 diabetes mellitus (HCC)    Diabetes mellitus with renal complications (HCC) 10/17/2014   Diabetes (HCC) 10/13/2014   Normocytic anemia 10/13/2014   Hyperkalemia 10/13/2014    PCP: Ransom Other, MD  REFERRING PROVIDER: Harden Jerona GAILS, MD  ONSET DATE: prosthesis delivery: 03/15/2024  REFERRING DIAG: S10.488 (ICD-10-CM) -  Right below-knee amputee (HCC)   THERAPY DIAG:  No diagnosis found.  Rationale for Evaluation and Treatment: Rehabilitation  SUBJECTIVE:   SUBJECTIVE STATEMENT: *** Pt accompanied by: significant other  PERTINENT HISTORY:  Right TTA 12/20/23, L TTA 2019, DM2, CKD3, PVD, arthritis, peripheral neuropathy, CVA,   Patient is a 78 y.o. male who arrives to PT session with new Rt BKA (12/20/2023) and prostheses (first RLE & new LLE) delivery date 03/15/2024. Patient has a history of previous Lt BKA in 2019. Patient reports being able to stand at sink with UE support. Patient has new prosthesis with vacuum pin suction on both LEs which is new system that he is not familiar with. Patient has progressed wear of Rt prosthesis from 1 hour 2x/day up to 3 hrs, 2x/ day with minimal discomfort.  PAIN:  Are you having pain? No  PRECAUTIONS: None  WEIGHT BEARING RESTRICTIONS: No  FALLS: Has patient fallen in last 6 months? No  LIVING ENVIRONMENT: Lives with: lives with their spouse Lives in: House Home Access: Ramped entrance or by garage which is one step Home layout: Two level, 1/2 bath on main level (not accessible with w/c), and Bed/bath upstairs Stairs: Yes: Internal: 16 steps; on left going up Has following equipment at home: Single point cane, Environmental consultant - 2 wheeled, Wheelchair (manual), shower chair, and Tour manager  OCCUPATION: retired, works as Art therapist on occasion  PLOF: modified independence  PATIENT GOALS: function with both prostheses at community level, doing yard Corporate treasurer   MD appt: follow up 04/30/2024   OBJECTIVE:  Patient-Specific Activity Scoring Scheme  0 represents "unable to perform." 10 represents "able to perform at prior level. 0 1 2 3 4 5 6 7 8 9  10 (Date and Score)  Activity Eval     1.  walking 0     2. standing ADLs  0    3. drive 0   4.stairs  0   5. Yardwork/handyman work 0   Score 0    Total score = sum of the activity  scores/number of activities Minimum detectable change (90%CI) for average score = 2 points Minimum detectable change (90%CI) for single activity score = 3 points  COGNITION: Overall cognitive status: Within functional limits for tasks assessed   SENSATION: WFL  POSTURE: rounded shoulders, forward head, flexed trunk , and weight shift left  LOWER EXTREMITY ROM:  ROM P:passive  A:active Right eval Left eval  Hip flexion    Hip extension    Hip abduction    Hip adduction    Hip internal rotation    Hip external rotation    Knee flexion    Knee extension Seated  P: -5* Seated  P: 0*  Ankle dorsiflexion    Ankle plantarflexion    Ankle inversion    Ankle eversion     (Blank rows = not tested)  LOWER EXTREMITY MMT:  MMT Right eval Left eval  Hip flexion    Hip extension 3-/5 4-/5  Hip abduction 3/5 4-/5  Hip adduction    Hip internal rotation    Hip external rotation    Knee flexion 3/5   Knee extension 3/5   At Evaluation all strength testing is grossly seated and functionally standing / gait. (Blank rows = not tested)  TRANSFERS: Sit to stand: Mod A, dependent on heavy BUE support 18 chair with armrests, use RW to stabilize to stand upright Stand to sit: Min A with BUE support on armrests to 18 chair, requires RW for stability Lateral scoot transfers: SBA with BUE support and 2 chairs touching same height  FUNCTIONAL TESTs:  Pt requires minA with heavy BUE support on RW for static standing balance.   GAIT: Gait pattern: step to pattern, decreased step length- Right, decreased step length- Left, decreased stance time- Right, decreased hip/knee flexion- Right, Right hip hike, knee flexed in stance- Right, lateral hip instability, trunk flexed, and wide BOS Distance walked: 22' Assistive device utilized: Environmental consultant - 2 wheeled Level of assistance: Mod A   CURRENT PROSTHETIC WEAR ASSESSMENT: 03/20/2024  Patient is dependent with: skin check, residual limb care,  prosthetic cleaning, ply sock cleaning, correct ply sock adjustment, proper wear schedule/adjustment, and proper weight-bearing schedule/adjustment Donning prosthesis: Mod A Doffing prosthesis: Mod A Prosthetic wear tolerance: progressed up to 3 hours, 2x/day for last day,  he has worn right prosthesis daily since delivery 6 days ago.  He is wearing Left prosthesis all awake hours.  Prosthetic weight bearing tolerance: pt tolerated standing & gait with RW support for 5 min total with no c/o limb pain.  Edema: RLE pitting with 5-second capillary refill.  None noted in LLE. Residual limb condition:   Right: bone length 7, limb length 8, scar healed, no hair growth, normal skin color and temperature, 1 medial wound 2mm which is clean with no signs of infection, cylindrical shape  Left: conical shape, shrunk with bony landmarks prominent, dark & callused pressure areas at fibula head, tibial tuberosity and lateral tibial plateau.  Prosthetic description: total contact socket with backing pin suction, dynamic response feet    TODAY'S TREATMENT:                                                                                                                             DATE:  03/27/2024 ***  03/20/2024 Prosthetic Care with BKA prosthesis: See patient education below.  PATIENT EDUCATION: PATIENT EDUCATED ON FOLLOWING PROSTHETIC CARE: Education details: Alterations required long pants height modification for safety as prosthetist decreased his overall height by approximately 2 inches, shrinker use when prosthesis is off,  Skin check, Residual limb care, Prosthetic cleaning, Correct ply sock adjustment, Propper donning, Proper doffing, Proper wear schedule/adjustment, and Proper weight-bearing schedule/adjustment Prosthetic wear tolerance: 3 hours 2x/day, 7 days/week.  If after 5 days no unusual skin changes or pressure intolerance noted then can increase to 4 hours 2x/day Person educated: Patient and  Spouse Education method: Explanation, Demonstration, Tactile cues, and  Verbal cues Education comprehension: verbalized understanding, verbal cues required, tactile cues required, and needs further education  HOME EXERCISE PROGRAM:  ASSESSMENT:  CLINICAL IMPRESSION: ***  Patient is a 78 y.o. male who was seen today for physical therapy evaluation and treatment for prosthetic training with Rt BKA. Patient has bilateral BKA and has difficulty with gait, standing balance, and transfers. Additionally, patient requires additional prosthetic care education for healthy residual limb.  Patient has limited wear of right prosthesis which limits function and safety.  Patient is very high fall risk and is dependent on bilateral upper extremity support of rolling walker with assistance for all activities.  Patient will continue to benefit from skilled PT to address impairments and gain independence.   OBJECTIVE IMPAIRMENTS: Abnormal gait, decreased activity tolerance, decreased balance, decreased coordination, decreased endurance, decreased knowledge of use of DME, decreased mobility, difficulty walking, decreased ROM, decreased strength, impaired flexibility, improper body mechanics, postural dysfunction, and prosthetic dependency .   ACTIVITY LIMITATIONS: carrying, lifting, bending, sitting, standing, squatting, stairs, transfers, bathing, toileting, dressing, reach over head, hygiene/grooming, and locomotion level  PARTICIPATION LIMITATIONS: meal prep, cleaning, driving, shopping, community activity, occupation, and yard work  PERSONAL FACTORS: Age, Fitness, Past/current experiences, Time since onset of injury/illness/exacerbation, and 3+ comorbidities: see PMH are also affecting patient's functional outcome.   REHAB POTENTIAL: Good  CLINICAL DECISION MAKING: Evolving/moderate complexity  EVALUATION COMPLEXITY: Moderate   GOALS: Goals reviewed with patient? Yes  SHORT TERM GOALS: Target date:  04/19/2024  Patient donnes prosthesis modified independent & verbalizes proper cleaning. Baseline: SEE OBJECTIVE DATA Goal status: INITIAL 2.  Patient tolerates right prosthesis >10 hrs total /day without skin issues or limb pain. Baseline: SEE OBJECTIVE DATA Goal status: INITIAL  3.  Patient able to reach 7 and look over both shoulders with RW support with supervision. Baseline: SEE OBJECTIVE DATA Goal status: INITIAL  4. Patient ambulates 19' with RW & prosthesis with CGA. Baseline: SEE OBJECTIVE DATA Goal status: INITIAL  5. Patient transfers sit to/from stand 18 chair with armrest to RW with CGA.  Baseline: SEE OBJECTIVE DATA Goal status: INITIAL  LONG TERM GOALS: Target date: 06/18/2024  Patient demonstrates & verbalized understanding of prosthetic care to enable safe utilization of prostheses. Baseline: SEE OBJECTIVE DATA Goal status: INITIAL  Patient tolerates prostheses wear >90% of awake hours without skin or limb pain issues. Baseline: SEE OBJECTIVE DATA Goal status: INITIAL  Patient ambulates >500' with LRAD and prostheses independently Baseline: SEE OBJECTIVE DATA Goal status: INITIAL  Patient negotiates ramps, curbs & stairs with single rail with prostheses with LRAD independently. Baseline: SEE OBJECTIVE DATA Goal status: INITIAL  Patient demonstrates & verbalizes lifting, carrying, pushing, pulling with prostheses only safely. Baseline: SEE OBJECTIVE DATA Goal status: INITIAL  6.  Patient reports Patient-Specific Activity Score improved the average >/= 6 to indicate improvement in functional activities.   Baseline: SEE OBJECTIVE DATA Goal status: INITIAL  PLAN:  PT FREQUENCY: 2x/week  PT DURATION: 12 weeks  PLANNED INTERVENTIONS: 97164- PT Re-evaluation, 97750- Physical Performance Testing, 97110-Therapeutic exercises, 97530- Therapeutic activity, 97112- Neuromuscular re-education, 97535- Self Care, [id number]- Manual therapy, [phone]- Gait training,  [phone]- Prosthetic Initial , [phone]- Orthotic/Prosthetic subsequent, Patient/Family education, Balance training, Stair training, Scar mobilization, and DME instructions  PLAN FOR NEXT SESSION:   *** assess task of Berg with RW and set balance LTG,  review prosthetic care & progress wear, begin prosthetic gait with RW   Susannah Daring, PT, DPT 03/26/24 8:57 AM    Referring diagnosis? Z89.511 (ICD-10-CM) -  Right below-knee amputee (has history left BKA so now Bil. BKAs) Treatment diagnosis? (if different than referring diagnosis) R26.89, M62.81, M25.661, R26.81 What was this (referring dx) caused by? [x]  Surgery []  Fall []  Ongoing issue []  Arthritis []  Other: ____________  Laterality: []  Rt []  Lt [x]  Both  Check all possible CPT codes:  *CHOOSE 10 OR LESS*    See Planned Interventions listed in the Plan section of the Evaluation.

## 2024-03-27 ENCOUNTER — Ambulatory Visit (INDEPENDENT_AMBULATORY_CARE_PROVIDER_SITE_OTHER)

## 2024-03-27 DIAGNOSIS — R2681 Unsteadiness on feet: Secondary | ICD-10-CM

## 2024-03-27 DIAGNOSIS — M25661 Stiffness of right knee, not elsewhere classified: Secondary | ICD-10-CM

## 2024-03-27 DIAGNOSIS — R2689 Other abnormalities of gait and mobility: Secondary | ICD-10-CM | POA: Diagnosis not present

## 2024-03-27 DIAGNOSIS — M6281 Muscle weakness (generalized): Secondary | ICD-10-CM

## 2024-04-02 ENCOUNTER — Encounter: Payer: Self-pay | Admitting: Physical Therapy

## 2024-04-02 ENCOUNTER — Ambulatory Visit (INDEPENDENT_AMBULATORY_CARE_PROVIDER_SITE_OTHER): Admitting: Physical Therapy

## 2024-04-02 DIAGNOSIS — R2681 Unsteadiness on feet: Secondary | ICD-10-CM | POA: Diagnosis not present

## 2024-04-02 DIAGNOSIS — M6281 Muscle weakness (generalized): Secondary | ICD-10-CM | POA: Diagnosis not present

## 2024-04-02 DIAGNOSIS — R2689 Other abnormalities of gait and mobility: Secondary | ICD-10-CM

## 2024-04-02 DIAGNOSIS — M25661 Stiffness of right knee, not elsewhere classified: Secondary | ICD-10-CM | POA: Diagnosis not present

## 2024-04-02 NOTE — Therapy (Addendum)
 OUTPATIENT PHYSICAL THERAPY PROSTHETIC TREATMENT   Patient Name: Kevin Bradford MRN: [id number] DOB:1946-07-17, 78 y.o., male Today's Date: 04/02/2024  END OF SESSION:  PT End of Session - 04/02/24 1301     Visit Number 3    Number of Visits 24    Date for PT Re-Evaluation 06/18/24    Authorization Type [employer] MEDICARE    Progress Note Due on Visit 10    PT Start Time 1301    PT Stop Time 1345    PT Time Calculation (min) 44 min    Equipment Utilized During Treatment Gait belt    Activity Tolerance Patient tolerated treatment well    Behavior During Therapy WFL for tasks assessed/performed            Past Medical History:  Diagnosis Date   Arthritis    joint pain (08/16/2017)   Below knee amputation (HCC)    Cellulitis and abscess of leg 10/18/2014   CKD (chronic kidney disease) stage 3, GFR 30-59 ml/min (HCC)    creatinine increases with antibiotics per pt, no known kidney disease per patient   Dehiscence of closure of skin, subsequent encounter    Diabetic foot ulcer (HCC)    left   Diabetic peripheral neuropathy (HCC)    GERD (gastroesophageal reflux disease)    12/26/2017- not this year.    Osteomyelitis (HCC)    left transmetatarsal    Stroke (HCC)    Type II diabetes mellitus [facility])    Past Surgical History:  Procedure Laterality Date   AMPUTATION Left 07/19/2017   Procedure: LEFT TRANSMETATARSAL AMPUTATION;  Surgeon: Harden Jerona GAILS, MD;  Location: [facility] OR;  Service: Orthopedics;  Laterality: Left;   AMPUTATION Left 08/16/2017   REVISION LEFT TRANSMETATARSAL AMPUTATION   AMPUTATION Left 11/22/2017   Procedure: LEFT BELOW KNEE AMPUTATION;  Surgeon: Harden Jerona GAILS, MD;  Location: [facility] OR;  Service: Orthopedics;  Laterality: Left;   AMPUTATION Right 12/20/2023   Procedure: AMPUTATION RIGHT BELOW KNEE;  Surgeon: Harden Jerona GAILS, MD;  Location: [facility] OR;  Service: Orthopedics;  Laterality: Right;   CATARACT EXTRACTION W/ INTRAOCULAR LENS  IMPLANT, BILATERAL  ~ 2016   EYE  SURGERY Bilateral    catarct   I & D EXTREMITY Left 10/17/2014   Procedure: IRRIGATION AND DEBRIDEMENT EXTREMITY LEFT FOOT;  Surgeon: Jerona Harden GAILS, MD;  Location: [facility] OR;  Service: Orthopedics;  Laterality: Left;   IR ANGIO INTRA EXTRACRAN SEL COM CAROTID INNOMINATE BILAT MOD SED  05/07/2018   IR ANGIO VERTEBRAL SEL SUBCLAVIAN INNOMINATE BILAT MOD SED  05/07/2018   STUMP REVISION Left 08/16/2017   Procedure: REVISION LEFT TRANSMETATARSAL AMPUTATION;  Surgeon: Harden Jerona GAILS, MD;  Location: [facility] OR;  Service: Orthopedics;  Laterality: Left;   STUMP REVISION Left 12/27/2017   Procedure: LEFT BELOW KNEE AMPUTATION REVISION;  Surgeon: Harden Jerona GAILS, MD;  Location: [facility] OR;  Service: Orthopedics;  Laterality: Left;   TOE AMPUTATION Right 2013   5th toe   Patient Active Problem List   Diagnosis Date Noted   Coping style affecting medical condition 12/29/2023   Malnutrition of moderate degree 12/25/2023   Right below-knee amputee (HCC) 12/25/2023   Diabetic infection of right foot (HCC) 12/16/2023   History of stroke 12/16/2023   Acute osteomyelitis of right calcaneus (HCC) 12/16/2023   Cutaneous abscess of right foot 12/16/2023   DKA, type 2 (HCC) 12/08/2023   Hypokalemia 12/08/2023   S/P BKA (below knee amputation), left (HCC) 12/08/2023   Hyperosmolar hyperglycemic  state (HHS) (HCC) 12/07/2023   Stroke (HCC)    Symptomatic bradycardia 05/06/2018   Postural dizziness with near syncope 05/06/2018   Labile blood glucose    Acute blood loss anemia    Labile blood pressure    CKD (chronic kidney disease) stage 2, GFR 60-89 ml/min    Type 2 diabetes mellitus with peripheral neuropathy (HCC)    Post-operative pain    Amputation of left lower extremity below knee upon examination (HCC) 11/24/2017   Amputated below knee, left (HCC) 11/22/2017   Osteomyelitis of left foot (HCC)    Dehiscence of amputation stump (HCC) 08/11/2017   S/P transmetatarsal amputation of foot, left (HCC) 07/19/2017    Subacute osteomyelitis, left ankle and foot (HCC) 07/17/2017   Diabetic ulcer of left foot associated with type 2 diabetes mellitus (HCC)    Diabetes mellitus with renal complications (HCC) 10/17/2014   Diabetes (HCC) 10/13/2014   Normocytic anemia 10/13/2014   Hyperkalemia 10/13/2014    PCP: Ransom Other, MD  REFERRING PROVIDER: Harden Jerona GAILS, MD  ONSET DATE: prosthesis delivery: 03/15/2024  REFERRING DIAG: S10.488 (ICD-10-CM) - Right below-knee amputee (HCC)   THERAPY DIAG:  Other abnormalities of gait and mobility  Muscle weakness (generalized)  Stiffness of right knee, not elsewhere classified  Unsteadiness on feet  Rationale for Evaluation and Treatment: Rehabilitation  SUBJECTIVE:   SUBJECTIVE STATEMENT: Patient has been walking more with RW and arrived to PT ambulating with RW. Patient reports wearing prosthesis 3hr/2x day   Pt accompanied by: significant other  PERTINENT HISTORY:  Right TTA 12/20/23, L TTA 2019, DM2, CKD3, PVD, arthritis, peripheral neuropathy, CVA,   Patient is a 78 y.o. male who arrives to PT session with new Rt BKA (12/20/2023) and prostheses (first RLE & new LLE) delivery date 03/15/2024. Patient has a history of previous Lt BKA in 2019. Patient reports being able to stand at sink with UE support. Patient has new prosthesis with vacuum pin suction on both LEs which is new system that he is not familiar with. Patient has progressed wear of Rt prosthesis from 1 hour 2x/day up to 3 hrs, 2x/ day with minimal discomfort.  PAIN:  Are you having pain? No  PRECAUTIONS: None  WEIGHT BEARING RESTRICTIONS: No  FALLS: Has patient fallen in last 6 months? No  LIVING ENVIRONMENT: Lives with: lives with their spouse Lives in: House Home Access: Ramped entrance or by garage which is one step Home layout: Two level, 1/2 bath on main level (not accessible with w/c), and Bed/bath upstairs Stairs: Yes: Internal: 16 steps; on left going up Has following  equipment at home: Single point cane, Environmental consultant - 2 wheeled, Wheelchair (manual), shower chair, and Tour manager  OCCUPATION: retired, works as Art therapist on occasion  PLOF: modified independence  PATIENT GOALS: function with both prostheses at community level, doing yard Corporate treasurer   MD appt: follow up 04/30/2024   OBJECTIVE:  Patient-Specific Activity Scoring Scheme  0 represents "unable to perform." 10 represents "able to perform at prior level. 0 1 2 3 4 5 6 7 8 9  10 (Date and Score)  Activity Eval     1.  walking 0     2. standing ADLs  0    3. drive 0   4.stairs  0   5. Yardwork/handyman work 0   Score 0    Total score = sum of the activity scores/number of activities Minimum detectable change (90%CI) for average score = 2 points Minimum detectable change (  90%CI) for single activity score = 3 points  COGNITION: Overall cognitive status: Within functional limits for tasks assessed   SENSATION: WFL  POSTURE: rounded shoulders, forward head, flexed trunk , and weight shift left  LOWER EXTREMITY ROM:  ROM P:passive  A:active Right eval Left eval  Hip flexion    Hip extension    Hip abduction    Hip adduction    Hip internal rotation    Hip external rotation    Knee flexion    Knee extension Seated  P: -5* Seated  P: 0*  Ankle dorsiflexion    Ankle plantarflexion    Ankle inversion    Ankle eversion     (Blank rows = not tested)  LOWER EXTREMITY MMT:  MMT Right eval Left eval  Hip flexion    Hip extension 3-/5 4-/5  Hip abduction 3/5 4-/5  Hip adduction    Hip internal rotation    Hip external rotation    Knee flexion 3/5   Knee extension 3/5   At Evaluation all strength testing is grossly seated and functionally standing / gait. (Blank rows = not tested)  TRANSFERS: Sit to stand: Mod A, dependent on heavy BUE support 18 chair with armrests, use RW to stabilize to stand upright Stand to sit: Min A with BUE support on  armrests to 18 chair, requires RW for stability Lateral scoot transfers: SBA with BUE support and 2 chairs touching same height  FUNCTIONAL TESTs:  Pt requires minA with heavy BUE support on RW for static standing balance.   GAIT: Gait pattern: step to pattern, decreased step length- Right, decreased step length- Left, decreased stance time- Right, decreased hip/knee flexion- Right, Right hip hike, knee flexed in stance- Right, lateral hip instability, trunk flexed, and wide BOS Distance walked: 22' Assistive device utilized: Environmental consultant - 2 wheeled Level of assistance: Mod A   CURRENT PROSTHETIC WEAR ASSESSMENT: 03/20/2024  Patient is dependent with: skin check, residual limb care, prosthetic cleaning, ply sock cleaning, correct ply sock adjustment, proper wear schedule/adjustment, and proper weight-bearing schedule/adjustment Donning prosthesis: Mod A Doffing prosthesis: Mod A Prosthetic wear tolerance: progressed up to 3 hours, 2x/day for last day,  he has worn right prosthesis daily since delivery 6 days ago.  He is wearing Left prosthesis all awake hours.  Prosthetic weight bearing tolerance: pt tolerated standing & gait with RW support for 5 min total with no c/o limb pain.  Edema: RLE pitting with 5-second capillary refill.  None noted in LLE. Residual limb condition:   Right: bone length 7, limb length 8, scar healed, no hair growth, normal skin color and temperature, 1 medial wound 2mm which is clean with no signs of infection, cylindrical shape  Left: conical shape, shrunk with bony landmarks prominent, dark & callused pressure areas at fibula head, tibial tuberosity and lateral tibial plateau.  Prosthetic description: total contact socket with backing pin suction, dynamic response feet    TODAY'S TREATMENT:  DATE:  04/02/2024 Prosthetic Care with BKA  prosthesis: PT verbally educated patient about short, medium, and long walks to build up walking endurance. 1-2 long walks, 4+ medium walks, and high frequency of short walks. Additionally, PT verbally educated and demo about how to safely do a wheelchair follow with CGA. Patient and significant other verbalized understanding  Patient performed STS with UE regression. Initially w/ both UE on arm rest, then one UE on armrest and one UE on seat of chair, then both UE on seat of the chair. Patient had most difficulty with UE on seat of the chair due to have to reach further up with UE to stabilize with RW  Neuromuscular Re-education: working on balance Patient performed sit to stand and stood up straight with no RW for stabilization Patient reached forward 2-3 with single UE support of RW  Patient looked behind shoulder with single opposite UE support on walker. Done bilaterally Patient stood with RW support w/ eyes closed for 10 seconds. Done with single UE support for 10 seconds each. Patient had most difficult time and occasionally touched RW for re-stabilization with Lt UE support Reciprocal toe tapping with RW support. Initially with bilateral UE support and progressed to single UE support. Had the most difficulty with Lt UE support.  Patient stepped forward into a semi tandem stance with RW support. Balanced with single UE support Patient stepped to and over 4 step with RW support. Focused weightbearing onto leg stepping up and eccentric loading on stepping down.   TREATMENT:                                                                                                                             DATE:  03/27/2024 Prosthetic Care with BKA prosthesis: PT doffing Rt BKA prosthesis and liner in order to assess skin after increasing prosthesis wear time. Patient's wounds seem to have closed and mostly healed with patient only noting soreness in distal limb and friction rub on Rt patella. PT verbally  educated patient on using a very small amount of baby oil to the area to assist (just enough to where the skin isn't shiny from use). Patient then donned liners and Rt BKA prosthesis with min A and verbal cues on appropriate technique to decrease wrinkles and air pockets.  PT discussed with patient the potential need for prosthetist to assess proximal alignment of the prosthesis in order to maximize comfort and decrease rubbing.  Patient performing several sit<>stands from arm chair to RW using bilat UEs for sit and stand and having CGA throughout movement. Patient performing with posterior of bilat LEs up against chair to assist with balance and confidence.  PT discussed option of performing sit>stand by rising off the chair, walking bilat feet under body, then standing up fully to RW. Patient did not attempt this, but agreed that it may be helpful in certain situations. PT discussed increase in wear time and for patient to increase to 4  hours 2x/day for 5-7 days. Once patient is able to tolerate the timing without increase in soreness, redness, or skin breakdown, patient is able to increase time by 1 hour at a time. Patient acknowledges and agrees to plan moving forward.  Patient performing ambulation throughout session with RW and CGA; 1x44'4, 120'6, and 2x36'8. Patient performing with increased forward trunk lean, downward gaze, increased shoulder shrug with bilat shoulders, decreased step length with Rt LE PT discussing and performing appropriate technique for curb negotiation while using RW. PT verbally educated throughout to include ambulating up to curb until RW is touching curb, place feet in staggered stance to place RW on curb then stepping up with foot that is placed farther back (currently using stronger LE to step up) for step up onto curb. For step down from curb, patient will move RW close to the edge, use staggered stance, move RW to ground, then place one foot on edge of curb where ball  of foot is off, then step down with foot that is placed farther back.  Patient performed curb negotiation two times while switching stepping foot between performances. Patient performing with RW, close CGAx2, and with improved performance when stepping up with Rt and down with Lt.  Patient performing 1 sit>stand from chair without arms to RW using bilat UE on chair and having close CGA x2 and increasing to minAx2 to assist with full upright. Patient attempting to perform from front of chair, but unable, so patient performed at an angle in order to achieve appropriate foot placement and weight shift for stand.   03/20/2024 Prosthetic Care with BKA prosthesis: See patient education below.  PATIENT EDUCATION: PATIENT EDUCATED ON FOLLOWING PROSTHETIC CARE: Education details: Alterations required long pants height modification for safety as prosthetist decreased his overall height by approximately 2 inches, shrinker use when prosthesis is off,  Skin check, Residual limb care, Prosthetic cleaning, Correct ply sock adjustment, Propper donning, Proper doffing, Proper wear schedule/adjustment, and Proper weight-bearing schedule/adjustment Prosthetic wear tolerance: 3 hours 2x/day, 7 days/week.  If after 5 days no unusual skin changes or pressure intolerance noted then can increase to 4 hours 2x/day Person educated: Patient and Spouse Education method: Explanation, Demonstration, Tactile cues, and Verbal cues Education comprehension: verbalized understanding, verbal cues required, tactile cues required, and needs further education  HOME EXERCISE PROGRAM:  ASSESSMENT:  CLINICAL IMPRESSION:  Patient arrived to session ambulating with RW indicating an overall progression with balance and endurance. Patient prosthesis tolerance is increasing without spikes in pain or discomfort. Patient has difficult with eccentric lowering of the Lt LE when negotiating step with RW support. Balance is improving with exercises.  Patient will continue to benefit from skilled PT to address impairments and gain independence.  OBJECTIVE IMPAIRMENTS: Abnormal gait, decreased activity tolerance, decreased balance, decreased coordination, decreased endurance, decreased knowledge of use of DME, decreased mobility, difficulty walking, decreased ROM, decreased strength, impaired flexibility, improper body mechanics, postural dysfunction, and prosthetic dependency .   ACTIVITY LIMITATIONS: carrying, lifting, bending, sitting, standing, squatting, stairs, transfers, bathing, toileting, dressing, reach over head, hygiene/grooming, and locomotion level  PARTICIPATION LIMITATIONS: meal prep, cleaning, driving, shopping, community activity, occupation, and yard work  PERSONAL FACTORS: Age, Fitness, Past/current experiences, Time since onset of injury/illness/exacerbation, and 3+ comorbidities: see PMH are also affecting patient's functional outcome.   REHAB POTENTIAL: Good  CLINICAL DECISION MAKING: Evolving/moderate complexity  EVALUATION COMPLEXITY: Moderate   GOALS: Goals reviewed with patient? Yes  SHORT TERM GOALS: Target date: 04/19/2024  Patient donnes prosthesis  modified independent & verbalizes proper cleaning. Baseline: SEE OBJECTIVE DATA Goal status: ongoing, 04/02/2024 2.  Patient tolerates right prosthesis >10 hrs total /day without skin issues or limb pain. Baseline: SEE OBJECTIVE DATA Goal status: ongoing, 04/02/2024  3.  Patient able to reach 7 and look over both shoulders with RW support with supervision. Baseline: SEE OBJECTIVE DATA Goal status: ongoing, 04/02/2024  4. Patient ambulates 84' with RW & prosthesis with CGA. Baseline: SEE OBJECTIVE DATA Goal status: ongoing, 04/02/2024  5. Patient transfers sit to/from stand 18 chair with armrest to RW with CGA.  Baseline: SEE OBJECTIVE DATA Goal status: ongoing, 04/02/2024  LONG TERM GOALS: Target date: 06/18/2024  Patient demonstrates & verbalized  understanding of prosthetic care to enable safe utilization of prostheses. Baseline: SEE OBJECTIVE DATA Goal status: ongoing, 04/02/2024  Patient tolerates prostheses wear >90% of awake hours without skin or limb pain issues. Baseline: SEE OBJECTIVE DATA Goal status: ongoing, 04/02/2024  Patient ambulates >500' with LRAD and prostheses independently Baseline: SEE OBJECTIVE DATA Goal status: ongoing, 04/02/2024  Patient negotiates ramps, curbs & stairs with single rail with prostheses with LRAD independently. Baseline: SEE OBJECTIVE DATA Goal status: ongoing, 04/02/2024  Patient demonstrates & verbalizes lifting, carrying, pushing, pulling with prostheses only safely. Baseline: SEE OBJECTIVE DATA Goal status: ongoing, 04/02/2024  6.  Patient reports Patient-Specific Activity Score improved the average >/= 6 to indicate improvement in functional activities.   Baseline: SEE OBJECTIVE DATA Goal status: ongoing, 04/02/2024  PLAN:  PT FREQUENCY: 2x/week  PT DURATION: 12 weeks  PLANNED INTERVENTIONS: 97164- PT Re-evaluation, 97750- Physical Performance Testing, 97110-Therapeutic exercises, 97530- Therapeutic activity, 97112- Neuromuscular re-education, 97535- Self Care, 02859- Manual therapy, [phone]- Gait training, [phone]- Prosthetic Initial , [phone]- Orthotic/Prosthetic subsequent, Patient/Family education, Balance training, Stair training, Scar mobilization, and DME instructions  PLAN FOR NEXT SESSION:  Review curb/ramp, gait with RW, and review balance activities  (task of BERG) for home    Ismael Theophilus Stallion, SPT 04/02/24 4:05 PM   This entire session of physical therapy was performed under the direct supervision of PT signing evaluation /treatment. PT reviewed note and agrees.   Grayce Spatz, PT, DPT 04/02/2024, 5:04 PM   Referring diagnosis? Z89.511 (ICD-10-CM) - Right below-knee amputee (has history left BKA so now Bil. BKAs) Treatment diagnosis? (if different than  referring diagnosis) R26.89, M62.81, M25.661, R26.81 What was this (referring dx) caused by? [x]  Surgery []  Fall []  Ongoing issue []  Arthritis []  Other: ____________  Laterality: []  Rt []  Lt [x]  Both  Check all possible CPT codes:  *CHOOSE 10 OR LESS*    See Planned Interventions listed in the Plan section of the Evaluation.

## 2024-04-04 NOTE — Therapy (Signed)
 OUTPATIENT PHYSICAL THERAPY PROSTHETIC TREATMENT   Patient Name: Kevin Bradford MRN: [id number] DOB:02-05-1946, 78 y.o., male Today's Date: 04/05/2024  END OF SESSION:  PT End of Session - 04/05/24 1023     Visit Number 4    Number of Visits 24    Date for Recertification  06/18/24    Authorization Type [employer] MEDICARE    Progress Note Due on Visit 10    PT Start Time 1020    PT Stop Time 1100    PT Time Calculation (min) 40 min    Equipment Utilized During Treatment Gait belt    Activity Tolerance Patient tolerated treatment well    Behavior During Therapy WFL for tasks assessed/performed             Past Medical History:  Diagnosis Date   Arthritis    joint pain (08/16/2017)   Below knee amputation (HCC)    Cellulitis and abscess of leg 10/18/2014   CKD (chronic kidney disease) stage 3, GFR 30-59 ml/min (HCC)    creatinine increases with antibiotics per pt, no known kidney disease per patient   Dehiscence of closure of skin, subsequent encounter    Diabetic foot ulcer (HCC)    left   Diabetic peripheral neuropathy (HCC)    GERD (gastroesophageal reflux disease)    12/26/2017- not this year.    Osteomyelitis (HCC)    left transmetatarsal    Stroke (HCC)    Type II diabetes mellitus [facility])    Past Surgical History:  Procedure Laterality Date   AMPUTATION Left 07/19/2017   Procedure: LEFT TRANSMETATARSAL AMPUTATION;  Surgeon: Harden Jerona GAILS, MD;  Location: [facility] OR;  Service: Orthopedics;  Laterality: Left;   AMPUTATION Left 08/16/2017   REVISION LEFT TRANSMETATARSAL AMPUTATION   AMPUTATION Left 11/22/2017   Procedure: LEFT BELOW KNEE AMPUTATION;  Surgeon: Harden Jerona GAILS, MD;  Location: [facility] OR;  Service: Orthopedics;  Laterality: Left;   AMPUTATION Right 12/20/2023   Procedure: AMPUTATION RIGHT BELOW KNEE;  Surgeon: Harden Jerona GAILS, MD;  Location: [facility] OR;  Service: Orthopedics;  Laterality: Right;   CATARACT EXTRACTION W/ INTRAOCULAR LENS  IMPLANT, BILATERAL  ~ 2016    EYE SURGERY Bilateral    catarct   I & D EXTREMITY Left 10/17/2014   Procedure: IRRIGATION AND DEBRIDEMENT EXTREMITY LEFT FOOT;  Surgeon: Jerona Harden GAILS, MD;  Location: [facility] OR;  Service: Orthopedics;  Laterality: Left;   IR ANGIO INTRA EXTRACRAN SEL COM CAROTID INNOMINATE BILAT MOD SED  05/07/2018   IR ANGIO VERTEBRAL SEL SUBCLAVIAN INNOMINATE BILAT MOD SED  05/07/2018   STUMP REVISION Left 08/16/2017   Procedure: REVISION LEFT TRANSMETATARSAL AMPUTATION;  Surgeon: Harden Jerona GAILS, MD;  Location: [facility] OR;  Service: Orthopedics;  Laterality: Left;   STUMP REVISION Left 12/27/2017   Procedure: LEFT BELOW KNEE AMPUTATION REVISION;  Surgeon: Harden Jerona GAILS, MD;  Location: [facility] OR;  Service: Orthopedics;  Laterality: Left;   TOE AMPUTATION Right 2013   5th toe   Patient Active Problem List   Diagnosis Date Noted   Coping style affecting medical condition 12/29/2023   Malnutrition of moderate degree 12/25/2023   Right below-knee amputee (HCC) 12/25/2023   Diabetic infection of right foot (HCC) 12/16/2023   History of stroke 12/16/2023   Acute osteomyelitis of right calcaneus (HCC) 12/16/2023   Cutaneous abscess of right foot 12/16/2023   DKA, type 2 (HCC) 12/08/2023   Hypokalemia 12/08/2023   S/P BKA (below knee amputation), left (HCC) 12/08/2023   Hyperosmolar  hyperglycemic state (HHS) (HCC) 12/07/2023   Stroke (HCC)    Symptomatic bradycardia 05/06/2018   Postural dizziness with near syncope 05/06/2018   Labile blood glucose    Acute blood loss anemia    Labile blood pressure    CKD (chronic kidney disease) stage 2, GFR 60-89 ml/min    Type 2 diabetes mellitus with peripheral neuropathy (HCC)    Post-operative pain    Amputation of left lower extremity below knee upon examination (HCC) 11/24/2017   Amputated below knee, left (HCC) 11/22/2017   Osteomyelitis of left foot (HCC)    Dehiscence of amputation stump (HCC) 08/11/2017   S/P transmetatarsal amputation of foot, left (HCC) 07/19/2017    Subacute osteomyelitis, left ankle and foot (HCC) 07/17/2017   Diabetic ulcer of left foot associated with type 2 diabetes mellitus (HCC)    Diabetes mellitus with renal complications (HCC) 10/17/2014   Diabetes (HCC) 10/13/2014   Normocytic anemia 10/13/2014   Hyperkalemia 10/13/2014    PCP: Ransom Other, MD  REFERRING PROVIDER: Harden Jerona GAILS, MD  ONSET DATE: prosthesis delivery: 03/15/2024  REFERRING DIAG: S10.488 (ICD-10-CM) - Right below-knee amputee (HCC)   THERAPY DIAG:  Other abnormalities of gait and mobility  Muscle weakness (generalized)  Stiffness of right knee, not elsewhere classified  Unsteadiness on feet  Rationale for Evaluation and Treatment: Rehabilitation  SUBJECTIVE:   SUBJECTIVE STATEMENT:  Patient endorses no problem performing long walk at [employer] and performing the ramp with RW without LOB. Minor discomfort in Rt residual limb rated 0.5/10.   Pt accompanied by: significant other  PERTINENT HISTORY:  Right TTA 12/20/23, L TTA 2019, DM2, CKD3, PVD, arthritis, peripheral neuropathy, CVA,   Patient is a 77 y.o. male who arrives to PT session with new Rt BKA (12/20/2023) and prostheses (first RLE & new LLE) delivery date 03/15/2024. Patient has a history of previous Lt BKA in 2019. Patient reports being able to stand at sink with UE support. Patient has new prosthesis with vacuum pin suction on both LEs which is new system that he is not familiar with. Patient has progressed wear of Rt prosthesis from 1 hour 2x/day up to 3 hrs, 2x/ day with minimal discomfort.  PAIN:  Are you having pain? No  PRECAUTIONS: None  WEIGHT BEARING RESTRICTIONS: No  FALLS: Has patient fallen in last 6 months? No  LIVING ENVIRONMENT: Lives with: lives with their spouse Lives in: House Home Access: Ramped entrance or by garage which is one step Home layout: Two level, 1/2 bath on main level (not accessible with w/c), and Bed/bath upstairs Stairs: Yes: Internal: 16 steps; on  left going up Has following equipment at home: Single point cane, Environmental consultant - 2 wheeled, Wheelchair (manual), shower chair, and Tour manager  OCCUPATION: retired, works as Art therapist on occasion  PLOF: modified independence  PATIENT GOALS: function with both prostheses at community level, doing yard Corporate treasurer   MD appt: follow up 04/30/2024   OBJECTIVE:  Patient-Specific Activity Scoring Scheme  0 represents "unable to perform." 10 represents "able to perform at prior level. 0 1 2 3 4 5 6 7 8 9  10 (Date and Score)  Activity Eval     1.  walking 0     2. standing ADLs  0    3. drive 0   4.stairs  0   5. Yardwork/handyman work 0   Score 0    Total score = sum of the activity scores/number of activities Minimum detectable change (90%CI) for average  score = 2 points Minimum detectable change (90%CI) for single activity score = 3 points  COGNITION: Overall cognitive status: Within functional limits for tasks assessed   SENSATION: WFL  POSTURE: rounded shoulders, forward head, flexed trunk , and weight shift left  LOWER EXTREMITY ROM:  ROM P:passive  A:active Right eval Left eval  Hip flexion    Hip extension    Hip abduction    Hip adduction    Hip internal rotation    Hip external rotation    Knee flexion    Knee extension Seated  P: -5* Seated  P: 0*  Ankle dorsiflexion    Ankle plantarflexion    Ankle inversion    Ankle eversion     (Blank rows = not tested)  LOWER EXTREMITY MMT:  MMT Right eval Left eval  Hip flexion    Hip extension 3-/5 4-/5  Hip abduction 3/5 4-/5  Hip adduction    Hip internal rotation    Hip external rotation    Knee flexion 3/5   Knee extension 3/5   At Evaluation all strength testing is grossly seated and functionally standing / gait. (Blank rows = not tested)  TRANSFERS: Sit to stand: Mod A, dependent on heavy BUE support 18 chair with armrests, use RW to stabilize to stand upright Stand to sit:  Min A with BUE support on armrests to 18 chair, requires RW for stability Lateral scoot transfers: SBA with BUE support and 2 chairs touching same height  FUNCTIONAL TESTs:  Pt requires minA with heavy BUE support on RW for static standing balance.   GAIT: Gait pattern: step to pattern, decreased step length- Right, decreased step length- Left, decreased stance time- Right, decreased hip/knee flexion- Right, Right hip hike, knee flexed in stance- Right, lateral hip instability, trunk flexed, and wide BOS Distance walked: 22' Assistive device utilized: Environmental consultant - 2 wheeled Level of assistance: Mod A   CURRENT PROSTHETIC WEAR ASSESSMENT: 03/20/2024  Patient is dependent with: skin check, residual limb care, prosthetic cleaning, ply sock cleaning, correct ply sock adjustment, proper wear schedule/adjustment, and proper weight-bearing schedule/adjustment Donning prosthesis: Mod A Doffing prosthesis: Mod A Prosthetic wear tolerance: progressed up to 3 hours, 2x/day for last day,  he has worn right prosthesis daily since delivery 6 days ago.  He is wearing Left prosthesis all awake hours.  Prosthetic weight bearing tolerance: pt tolerated standing & gait with RW support for 5 min total with no c/o limb pain.  Edema: RLE pitting with 5-second capillary refill.  None noted in LLE. Residual limb condition:   Right: bone length 7, limb length 8, scar healed, no hair growth, normal skin color and temperature, 1 medial wound 2mm which is clean with no signs of infection, cylindrical shape  Left: conical shape, shrunk with bony landmarks prominent, dark & callused pressure areas at fibula head, tibial tuberosity and lateral tibial plateau.  Prosthetic description: total contact socket with backing pin suction, dynamic response feet   TODAY'S TREATMENT:  DATE:  04/05/2024 Prosthetic  Care with BKA prosthesis  PT discussed importance of continuing with skin checks when increasing wear-time as patient will be increasing wear-time to 6 hours 2x/day during the weekend. Patient and spouse acknowledged and agreed to complete skin checks when doffing prosthesis.  PT discussed importance of continuing ambulation with RW for short, medium, and long walks for mobility, balance, and endurance. PT also discussed decreasing activity for remainder of day to include mainly short and medium walks to decrease fatigue and residual limb soreness since completing long walk at [employer] as well as 6-min walk test. Patient and spouse acknowledged and agreed.  Patient performing sit<>stands with RW and SUP>SBA throughout session. PT providing verbal cues for walking feet under body once bottom is out of chair in order to increase BOS/center of gravity and decrease imbalance with standing to RW. Patient with appropriate carryover throughout session.  Patient ambulating throughout session with RW and SUP>SBA for 1x52'9 and 58'8. Patient performing with intermittent downward gaze and forward flexed posture. Patient endorses goal of one day returning to ambulation without AD. PT discussed options, progressions, and importance of improving balance/endurance.  Neuro Re-Ed:  Standing reactive balance activity performed with light up pods on window. Patient performed standing taps with one UE on RW and with close SBA.  Typical stance with Rt UE tapping 1x30s  Typical stance with Lt UE tapping 1x30s  Narrow stance with Rt Ue tapping 1x30s  Narrow stance with Lt UE tapping 1x30s  No required increase in physical assistance as well as no increase in postural sway throughout activity ; will benefit from no UE on RW throughout activity  PT discussing importance of improving balance for overall mobility and how cross body movements with one UE on RW challenges balance   Physical Performance:  Patient performed  6-minute walk test this date in order to assess cardiovascular endurance. Patient was able to ambulate 491'4 with RW and SUP>SBA. Patient vitals taken at end of assessment with SpO2 being 97% and HR being 100bpm.       TODAY'S TREATMENT:                                                                                                                             DATE:  04/02/2024 Prosthetic Care with BKA prosthesis: PT verbally educated patient about short, medium, and long walks to build up walking endurance. 1-2 long walks, 4+ medium walks, and high frequency of short walks. Additionally, PT verbally educated and demo about how to safely do a wheelchair follow with CGA. Patient and significant other verbalized understanding  Patient performed STS with UE regression. Initially w/ both UE on arm rest, then one UE on armrest and one UE on seat of chair, then both UE on seat of the chair. Patient had most difficulty with UE on seat of the chair due to have to reach further up with UE to stabilize with RW  Neuromuscular Re-education: working  on balance Patient performed sit to stand and stood up straight with no RW for stabilization Patient reached forward 2-3 with single UE support of RW  Patient looked behind shoulder with single opposite UE support on walker. Done bilaterally Patient stood with RW support w/ eyes closed for 10 seconds. Done with single UE support for 10 seconds each. Patient had most difficult time and occasionally touched RW for re-stabilization with Lt UE support Reciprocal toe tapping with RW support. Initially with bilateral UE support and progressed to single UE support. Had the most difficulty with Lt UE support.  Patient stepped forward into a semi tandem stance with RW support. Balanced with single UE support Patient stepped to and over 4 step with RW support. Focused weightbearing onto leg stepping up and eccentric loading on stepping down.   TREATMENT:                                                                                                                              DATE:  03/27/2024 Prosthetic Care with BKA prosthesis: PT doffing Rt BKA prosthesis and liner in order to assess skin after increasing prosthesis wear time. Patient's wounds seem to have closed and mostly healed with patient only noting soreness in distal limb and friction rub on Rt patella. PT verbally educated patient on using a very small amount of baby oil to the area to assist (just enough to where the skin isn't shiny from use). Patient then donned liners and Rt BKA prosthesis with min A and verbal cues on appropriate technique to decrease wrinkles and air pockets.  PT discussed with patient the potential need for prosthetist to assess proximal alignment of the prosthesis in order to maximize comfort and decrease rubbing.  Patient performing several sit<>stands from arm chair to RW using bilat UEs for sit and stand and having CGA throughout movement. Patient performing with posterior of bilat LEs up against chair to assist with balance and confidence.  PT discussed option of performing sit>stand by rising off the chair, walking bilat feet under body, then standing up fully to RW. Patient did not attempt this, but agreed that it may be helpful in certain situations. PT discussed increase in wear time and for patient to increase to 4 hours 2x/day for 5-7 days. Once patient is able to tolerate the timing without increase in soreness, redness, or skin breakdown, patient is able to increase time by 1 hour at a time. Patient acknowledges and agrees to plan moving forward.  Patient performing ambulation throughout session with RW and CGA; 1x44'4, 120'6, and 2x36'8. Patient performing with increased forward trunk lean, downward gaze, increased shoulder shrug with bilat shoulders, decreased step length with Rt LE PT discussing and performing appropriate technique for curb negotiation while using RW. PT  verbally educated throughout to include ambulating up to curb until RW is touching curb, place feet in staggered stance to place RW on curb then stepping up with foot that  is placed farther back (currently using stronger LE to step up) for step up onto curb. For step down from curb, patient will move RW close to the edge, use staggered stance, move RW to ground, then place one foot on edge of curb where ball of foot is off, then step down with foot that is placed farther back.  Patient performed curb negotiation two times while switching stepping foot between performances. Patient performing with RW, close CGAx2, and with improved performance when stepping up with Rt and down with Lt.  Patient performing 1 sit>stand from chair without arms to RW using bilat UE on chair and having close CGA x2 and increasing to minAx2 to assist with full upright. Patient attempting to perform from front of chair, but unable, so patient performed at an angle in order to achieve appropriate foot placement and weight shift for stand.   03/20/2024 Prosthetic Care with BKA prosthesis: See patient education below.  PATIENT EDUCATION: PATIENT EDUCATED ON FOLLOWING PROSTHETIC CARE: Education details: Alterations required long pants height modification for safety as prosthetist decreased his overall height by approximately 2 inches, shrinker use when prosthesis is off,  Skin check, Residual limb care, Prosthetic cleaning, Correct ply sock adjustment, Propper donning, Proper doffing, Proper wear schedule/adjustment, and Proper weight-bearing schedule/adjustment Prosthetic wear tolerance: 3 hours 2x/day, 7 days/week.  If after 5 days no unusual skin changes or pressure intolerance noted then can increase to 4 hours 2x/day Person educated: Patient and Spouse Education method: Explanation, Demonstration, Tactile cues, and Verbal cues Education comprehension: verbalized understanding, verbal cues required, tactile cues required, and  needs further education  HOME EXERCISE PROGRAM:  ASSESSMENT:  CLINICAL IMPRESSION:  Patient arrived to session endorsing increase in physical activity since last session with no instances of LOB, though continues to feel anxiety/fear about walking in areas that may be busier (parking lots, etc.). Patient tolerated all activities this well with only slightly noting increased fatigue following 6-min walk test. Patient had no instances of LOB and will benefit from continued balance challenges without use of UEs. Patient will continue to benefit from skilled PT.   OBJECTIVE IMPAIRMENTS: Abnormal gait, decreased activity tolerance, decreased balance, decreased coordination, decreased endurance, decreased knowledge of use of DME, decreased mobility, difficulty walking, decreased ROM, decreased strength, impaired flexibility, improper body mechanics, postural dysfunction, and prosthetic dependency .   ACTIVITY LIMITATIONS: carrying, lifting, bending, sitting, standing, squatting, stairs, transfers, bathing, toileting, dressing, reach over head, hygiene/grooming, and locomotion level  PARTICIPATION LIMITATIONS: meal prep, cleaning, driving, shopping, community activity, occupation, and yard work  PERSONAL FACTORS: Age, Fitness, Past/current experiences, Time since onset of injury/illness/exacerbation, and 3+ comorbidities: see PMH are also affecting patient's functional outcome.   REHAB POTENTIAL: Good  CLINICAL DECISION MAKING: Evolving/moderate complexity  EVALUATION COMPLEXITY: Moderate   GOALS: Goals reviewed with patient? Yes  SHORT TERM GOALS: Target date: 04/19/2024  Patient donnes prosthesis modified independent & verbalizes proper cleaning. Baseline: SEE OBJECTIVE DATA Goal status: ongoing, 04/02/2024 2.  Patient tolerates right prosthesis >10 hrs total /day without skin issues or limb pain. Baseline: SEE OBJECTIVE DATA Goal status: ongoing, 04/02/2024  3.  Patient able to reach 7  and look over both shoulders with RW support with supervision. Baseline: SEE OBJECTIVE DATA Goal status: ongoing, 04/02/2024  4. Patient ambulates 68' with RW & prosthesis with CGA. Baseline: SEE OBJECTIVE DATA Goal status: ongoing, 04/02/2024  5. Patient transfers sit to/from stand 18 chair with armrest to RW with CGA.  Baseline: SEE OBJECTIVE DATA Goal status:  ongoing, 04/02/2024  LONG TERM GOALS: Target date: 06/18/2024  Patient demonstrates & verbalized understanding of prosthetic care to enable safe utilization of prostheses. Baseline: SEE OBJECTIVE DATA Goal status: ongoing, 04/02/2024  Patient tolerates prostheses wear >90% of awake hours without skin or limb pain issues. Baseline: SEE OBJECTIVE DATA Goal status: ongoing, 04/02/2024  Patient ambulates >500' with LRAD and prostheses independently Baseline: SEE OBJECTIVE DATA Goal status: ongoing, 04/02/2024  Patient negotiates ramps, curbs & stairs with single rail with prostheses with LRAD independently. Baseline: SEE OBJECTIVE DATA Goal status: ongoing, 04/02/2024  Patient demonstrates & verbalizes lifting, carrying, pushing, pulling with prostheses only safely. Baseline: SEE OBJECTIVE DATA Goal status: ongoing, 04/02/2024  6.  Patient reports Patient-Specific Activity Score improved the average >/= 6 to indicate improvement in functional activities.   Baseline: SEE OBJECTIVE DATA Goal status: ongoing, 04/02/2024  PLAN:  PT FREQUENCY: 2x/week  PT DURATION: 12 weeks  PLANNED INTERVENTIONS: [id number]- PT Re-evaluation, 97750- Physical Performance Testing, 97110-Therapeutic exercises, 97530- Therapeutic activity, 97112- Neuromuscular re-education, 97535- Self Care, [id number]- Manual therapy, [phone]- Gait training, [phone]- Prosthetic Initial , [phone]- Orthotic/Prosthetic subsequent, Patient/Family education, Balance training, Stair training, Scar mobilization, and DME instructions  PLAN FOR NEXT SESSION:   Review curb/ramp, gait with  RW, and review balance activities  (task of BERG) for home , performing static balance activities within session without UE use    Susannah Daring, PT, DPT 04/05/24 4:23 PM     Referring diagnosis? Z89.511 (ICD-10-CM) - Right below-knee amputee (has history left BKA so now Bil. BKAs) Treatment diagnosis? (if different than referring diagnosis) R26.89, M62.81, M25.661, R26.81 What was this (referring dx) caused by? [x]  Surgery []  Fall []  Ongoing issue []  Arthritis []  Other: ____________  Laterality: []  Rt []  Lt [x]  Both  Check all possible CPT codes:  *CHOOSE 10 OR LESS*    See Planned Interventions listed in the Plan section of the Evaluation.

## 2024-04-05 ENCOUNTER — Ambulatory Visit (INDEPENDENT_AMBULATORY_CARE_PROVIDER_SITE_OTHER)

## 2024-04-05 DIAGNOSIS — M25661 Stiffness of right knee, not elsewhere classified: Secondary | ICD-10-CM | POA: Diagnosis not present

## 2024-04-05 DIAGNOSIS — R2689 Other abnormalities of gait and mobility: Secondary | ICD-10-CM | POA: Diagnosis not present

## 2024-04-05 DIAGNOSIS — M6281 Muscle weakness (generalized): Secondary | ICD-10-CM

## 2024-04-05 DIAGNOSIS — R2681 Unsteadiness on feet: Secondary | ICD-10-CM

## 2024-04-08 NOTE — Therapy (Signed)
 OUTPATIENT PHYSICAL THERAPY PROSTHETIC TREATMENT   Patient Name: Kevin Bradford MRN: [id number] DOB:Jun 30, 1946, 78 y.o., male Today's Date: 04/09/2024  END OF SESSION:  PT End of Session - 04/09/24 1432     Visit Number 5    Number of Visits 24    Date for Recertification  06/18/24    Authorization Type [employer] MEDICARE    Progress Note Due on Visit 10    PT Start Time 1432    PT Stop Time 1515    PT Time Calculation (min) 43 min    Equipment Utilized During Treatment Gait belt    Activity Tolerance Patient tolerated treatment well    Behavior During Therapy WFL for tasks assessed/performed              Past Medical History:  Diagnosis Date   Arthritis    joint pain (08/16/2017)   Below knee amputation (HCC)    Cellulitis and abscess of leg 10/18/2014   CKD (chronic kidney disease) stage 3, GFR 30-59 ml/min (HCC)    creatinine increases with antibiotics per pt, no known kidney disease per patient   Dehiscence of closure of skin, subsequent encounter    Diabetic foot ulcer (HCC)    left   Diabetic peripheral neuropathy (HCC)    GERD (gastroesophageal reflux disease)    12/26/2017- not this year.    Osteomyelitis (HCC)    left transmetatarsal    Stroke (HCC)    Type II diabetes mellitus [facility])    Past Surgical History:  Procedure Laterality Date   AMPUTATION Left 07/19/2017   Procedure: LEFT TRANSMETATARSAL AMPUTATION;  Surgeon: Harden Jerona GAILS, MD;  Location: [facility] OR;  Service: Orthopedics;  Laterality: Left;   AMPUTATION Left 08/16/2017   REVISION LEFT TRANSMETATARSAL AMPUTATION   AMPUTATION Left 11/22/2017   Procedure: LEFT BELOW KNEE AMPUTATION;  Surgeon: Harden Jerona GAILS, MD;  Location: [facility] OR;  Service: Orthopedics;  Laterality: Left;   AMPUTATION Right 12/20/2023   Procedure: AMPUTATION RIGHT BELOW KNEE;  Surgeon: Harden Jerona GAILS, MD;  Location: [facility] OR;  Service: Orthopedics;  Laterality: Right;   CATARACT EXTRACTION W/ INTRAOCULAR LENS  IMPLANT, BILATERAL  ~ 2016    EYE SURGERY Bilateral    catarct   I & D EXTREMITY Left 10/17/2014   Procedure: IRRIGATION AND DEBRIDEMENT EXTREMITY LEFT FOOT;  Surgeon: Jerona Harden GAILS, MD;  Location: [facility] OR;  Service: Orthopedics;  Laterality: Left;   IR ANGIO INTRA EXTRACRAN SEL COM CAROTID INNOMINATE BILAT MOD SED  05/07/2018   IR ANGIO VERTEBRAL SEL SUBCLAVIAN INNOMINATE BILAT MOD SED  05/07/2018   STUMP REVISION Left 08/16/2017   Procedure: REVISION LEFT TRANSMETATARSAL AMPUTATION;  Surgeon: Harden Jerona GAILS, MD;  Location: [facility] OR;  Service: Orthopedics;  Laterality: Left;   STUMP REVISION Left 12/27/2017   Procedure: LEFT BELOW KNEE AMPUTATION REVISION;  Surgeon: Harden Jerona GAILS, MD;  Location: [facility] OR;  Service: Orthopedics;  Laterality: Left;   TOE AMPUTATION Right 2013   5th toe   Patient Active Problem List   Diagnosis Date Noted   Coping style affecting medical condition 12/29/2023   Malnutrition of moderate degree 12/25/2023   Right below-knee amputee (HCC) 12/25/2023   Diabetic infection of right foot (HCC) 12/16/2023   History of stroke 12/16/2023   Acute osteomyelitis of right calcaneus (HCC) 12/16/2023   Cutaneous abscess of right foot 12/16/2023   DKA, type 2 (HCC) 12/08/2023   Hypokalemia 12/08/2023   S/P BKA (below knee amputation), left (HCC) 12/08/2023  Hyperosmolar hyperglycemic state (HHS) (HCC) 12/07/2023   Stroke (HCC)    Symptomatic bradycardia 05/06/2018   Postural dizziness with near syncope 05/06/2018   Labile blood glucose    Acute blood loss anemia    Labile blood pressure    CKD (chronic kidney disease) stage 2, GFR 60-89 ml/min    Type 2 diabetes mellitus with peripheral neuropathy (HCC)    Post-operative pain    Amputation of left lower extremity below knee upon examination (HCC) 11/24/2017   Amputated below knee, left (HCC) 11/22/2017   Osteomyelitis of left foot (HCC)    Dehiscence of amputation stump (HCC) 08/11/2017   S/P transmetatarsal amputation of foot, left (HCC) 07/19/2017    Subacute osteomyelitis, left ankle and foot (HCC) 07/17/2017   Diabetic ulcer of left foot associated with type 2 diabetes mellitus (HCC)    Diabetes mellitus with renal complications (HCC) 10/17/2014   Diabetes (HCC) 10/13/2014   Normocytic anemia 10/13/2014   Hyperkalemia 10/13/2014    PCP: Ransom Other, MD  REFERRING PROVIDER: Harden Jerona GAILS, MD  ONSET DATE: prosthesis delivery: 03/15/2024  REFERRING DIAG: S10.488 (ICD-10-CM) - Right below-knee amputee (HCC)   THERAPY DIAG:  Other abnormalities of gait and mobility  Muscle weakness (generalized)  Stiffness of right knee, not elsewhere classified  Unsteadiness on feet  Rationale for Evaluation and Treatment: Rehabilitation  SUBJECTIVE:   SUBJECTIVE STATEMENT:  Patient noting multiple short, medium, and long distance walks since last session with no adverse events. Increased wear time to 6 hour 2x/day with a soreness level of 0.25/10.   Pt accompanied by: significant other  PERTINENT HISTORY:  Right TTA 12/20/23, L TTA 2019, DM2, CKD3, PVD, arthritis, peripheral neuropathy, CVA,   Patient is a 78 y.o. male who arrives to PT session with new Rt BKA (12/20/2023) and prostheses (first RLE & new LLE) delivery date 03/15/2024. Patient has a history of previous Lt BKA in 2019. Patient reports being able to stand at sink with UE support. Patient has new prosthesis with vacuum pin suction on both LEs which is new system that he is not familiar with. Patient has progressed wear of Rt prosthesis from 1 hour 2x/day up to 3 hrs, 2x/ day with minimal discomfort.  PAIN:  Are you having pain? No  PRECAUTIONS: None  WEIGHT BEARING RESTRICTIONS: No  FALLS: Has patient fallen in last 6 months? No  LIVING ENVIRONMENT: Lives with: lives with their spouse Lives in: House Home Access: Ramped entrance or by garage which is one step Home layout: Two level, 1/2 bath on main level (not accessible with w/c), and Bed/bath upstairs Stairs: Yes:  Internal: 16 steps; on left going up Has following equipment at home: Single point cane, Environmental consultant - 2 wheeled, Wheelchair (manual), shower chair, and Tour manager  OCCUPATION: retired, works as Art therapist on occasion  PLOF: modified independence  PATIENT GOALS: function with both prostheses at community level, doing yard Corporate treasurer   MD appt: follow up 04/30/2024   OBJECTIVE:  Patient-Specific Activity Scoring Scheme  0 represents "unable to perform." 10 represents "able to perform at prior level. 0 1 2 3 4 5 6 7 8 9  10 (Date and Score)  Activity Eval     1.  walking 0     2. standing ADLs  0    3. drive 0   4.stairs  0   5. Yardwork/handyman work 0   Score 0    Total score = sum of the activity scores/number of activities Minimum  detectable change (90%CI) for average score = 2 points Minimum detectable change (90%CI) for single activity score = 3 points  COGNITION: Overall cognitive status: Within functional limits for tasks assessed   SENSATION: WFL  POSTURE: rounded shoulders, forward head, flexed trunk , and weight shift left  LOWER EXTREMITY ROM:  ROM P:passive  A:active Right eval Left eval  Hip flexion    Hip extension    Hip abduction    Hip adduction    Hip internal rotation    Hip external rotation    Knee flexion    Knee extension Seated  P: -5* Seated  P: 0*  Ankle dorsiflexion    Ankle plantarflexion    Ankle inversion    Ankle eversion     (Blank rows = not tested)  LOWER EXTREMITY MMT:  MMT Right eval Left eval  Hip flexion    Hip extension 3-/5 4-/5  Hip abduction 3/5 4-/5  Hip adduction    Hip internal rotation    Hip external rotation    Knee flexion 3/5   Knee extension 3/5   At Evaluation all strength testing is grossly seated and functionally standing / gait. (Blank rows = not tested)  TRANSFERS: Sit to stand: Mod A, dependent on heavy BUE support 18 chair with armrests, use RW to stabilize to stand  upright Stand to sit: Min A with BUE support on armrests to 18 chair, requires RW for stability Lateral scoot transfers: SBA with BUE support and 2 chairs touching same height  FUNCTIONAL TESTs:  Pt requires minA with heavy BUE support on RW for static standing balance.   GAIT: Gait pattern: step to pattern, decreased step length- Right, decreased step length- Left, decreased stance time- Right, decreased hip/knee flexion- Right, Right hip hike, knee flexed in stance- Right, lateral hip instability, trunk flexed, and wide BOS Distance walked: 22' Assistive device utilized: Environmental consultant - 2 wheeled Level of assistance: Mod A   CURRENT PROSTHETIC WEAR ASSESSMENT: 03/20/2024  Patient is dependent with: skin check, residual limb care, prosthetic cleaning, ply sock cleaning, correct ply sock adjustment, proper wear schedule/adjustment, and proper weight-bearing schedule/adjustment Donning prosthesis: Mod A Doffing prosthesis: Mod A Prosthetic wear tolerance: progressed up to 3 hours, 2x/day for last day,  he has worn right prosthesis daily since delivery 6 days ago.  He is wearing Left prosthesis all awake hours.  Prosthetic weight bearing tolerance: pt tolerated standing & gait with RW support for 5 min total with no c/o limb pain.  Edema: RLE pitting with 5-second capillary refill.  None noted in LLE. Residual limb condition:   Right: bone length 7, limb length 8, scar healed, no hair growth, normal skin color and temperature, 1 medial wound 2mm which is clean with no signs of infection, cylindrical shape  Left: conical shape, shrunk with bony landmarks prominent, dark & callused pressure areas at fibula head, tibial tuberosity and lateral tibial plateau.  Prosthetic description: total contact socket with backing pin suction, dynamic response feet  TODAY'S TREATMENT:  DATE:   04/09/2024 Neuro Re-Ed:  Patient performing tossing and catching with ball with CGA>min A from PT. Min A required intermittently due to posterior LOB. Patient intermittently changing foot placement between typical stance, wide stance, semi-tandem, and narrow stance with no difference in balance with each. Patient performed 40 total tosses with intermittent UE use on RW.  Physical Performance:  PT began assessing BERG on patient using SUP>CGA throughout. Patient intermittently using RW for certain tasks (sit<>stand, transfers) though able to mostly perform balance/reaching activities with no support on RW. Patient requiring intermittent seated rest breaks and using UEs on RW between standing tasks. PT discussed remaining objects of assessment, that were not completed due to time constraints, with patient and whether they will be assessed with RW, without RW, or both. Patient acknowledged and agreed to continue assessment at next session.  Patient, spouse, and PT discussed addition of certain balance tasks to be completed at home and the safest options for performing at home in order to decrease risk of falls.   Prosthetic Care with BKA prosthesis  Patient performing several sit<>stands throughout session with UE use on chairs and standing to RW. Patient performing with SUP throughout and no required physical assist or verbal cues for appropriate form.  PT discussing potential progression with ambulation by using lofstrand crutches, bilat canes, unilat cane, and eventually no AD. PT discussing importance of balance with ambulation and potential need for bilat AD before progressing to unilat SPC/quad cane. Patient and spouse acknowledge, though patient seems hesitant to use bilat devices.  TODAY'S TREATMENT:                                                                                                                             DATE:  04/05/2024 Prosthetic Care with BKA prosthesis  PT discussed  importance of continuing with skin checks when increasing wear-time as patient will be increasing wear-time to 6 hours 2x/day during the weekend. Patient and spouse acknowledged and agreed to complete skin checks when doffing prosthesis.  PT discussed importance of continuing ambulation with RW for short, medium, and long walks for mobility, balance, and endurance. PT also discussed decreasing activity for remainder of day to include mainly short and medium walks to decrease fatigue and residual limb soreness since completing long walk at [employer] as well as 6-min walk test. Patient and spouse acknowledged and agreed.  Patient performing sit<>stands with RW and SUP>SBA throughout session. PT providing verbal cues for walking feet under body once bottom is out of chair in order to increase BOS/center of gravity and decrease imbalance with standing to RW. Patient with appropriate carryover throughout session.  Patient ambulating throughout session with RW and SUP>SBA for 1x52'9 and 58'8. Patient performing with intermittent downward gaze and forward flexed posture. Patient endorses goal of one day returning to ambulation without AD. PT discussed options, progressions, and importance of improving balance/endurance.  Neuro Re-Ed:  Standing reactive balance activity performed with light up  pods on window. Patient performed standing taps with one UE on RW and with close SBA.  Typical stance with Rt UE tapping 1x30s  Typical stance with Lt UE tapping 1x30s  Narrow stance with Rt Ue tapping 1x30s  Narrow stance with Lt UE tapping 1x30s  No required increase in physical assistance as well as no increase in postural sway throughout activity ; will benefit from no UE on RW throughout activity  PT discussing importance of improving balance for overall mobility and how cross body movements with one UE on RW challenges balance   Physical Performance:  Patient performed 6-minute walk test this date in order to  assess cardiovascular endurance. Patient was able to ambulate 491'4 with RW and SUP>SBA. Patient vitals taken at end of assessment with SpO2 being 97% and HR being 100bpm.       TODAY'S TREATMENT:                                                                                                                             DATE:  04/02/2024 Prosthetic Care with BKA prosthesis: PT verbally educated patient about short, medium, and long walks to build up walking endurance. 1-2 long walks, 4+ medium walks, and high frequency of short walks. Additionally, PT verbally educated and demo about how to safely do a wheelchair follow with CGA. Patient and significant other verbalized understanding  Patient performed STS with UE regression. Initially w/ both UE on arm rest, then one UE on armrest and one UE on seat of chair, then both UE on seat of the chair. Patient had most difficulty with UE on seat of the chair due to have to reach further up with UE to stabilize with RW  Neuromuscular Re-education: working on balance Patient performed sit to stand and stood up straight with no RW for stabilization Patient reached forward 2-3 with single UE support of RW  Patient looked behind shoulder with single opposite UE support on walker. Done bilaterally Patient stood with RW support w/ eyes closed for 10 seconds. Done with single UE support for 10 seconds each. Patient had most difficult time and occasionally touched RW for re-stabilization with Lt UE support Reciprocal toe tapping with RW support. Initially with bilateral UE support and progressed to single UE support. Had the most difficulty with Lt UE support.  Patient stepped forward into a semi tandem stance with RW support. Balanced with single UE support Patient stepped to and over 4 step with RW support. Focused weightbearing onto leg stepping up and eccentric loading on stepping down.   TREATMENT:  DATE:  03/27/2024 Prosthetic Care with BKA prosthesis: PT doffing Rt BKA prosthesis and liner in order to assess skin after increasing prosthesis wear time. Patient's wounds seem to have closed and mostly healed with patient only noting soreness in distal limb and friction rub on Rt patella. PT verbally educated patient on using a very small amount of baby oil to the area to assist (just enough to where the skin isn't shiny from use). Patient then donned liners and Rt BKA prosthesis with min A and verbal cues on appropriate technique to decrease wrinkles and air pockets.  PT discussed with patient the potential need for prosthetist to assess proximal alignment of the prosthesis in order to maximize comfort and decrease rubbing.  Patient performing several sit<>stands from arm chair to RW using bilat UEs for sit and stand and having CGA throughout movement. Patient performing with posterior of bilat LEs up against chair to assist with balance and confidence.  PT discussed option of performing sit>stand by rising off the chair, walking bilat feet under body, then standing up fully to RW. Patient did not attempt this, but agreed that it may be helpful in certain situations. PT discussed increase in wear time and for patient to increase to 4 hours 2x/day for 5-7 days. Once patient is able to tolerate the timing without increase in soreness, redness, or skin breakdown, patient is able to increase time by 1 hour at a time. Patient acknowledges and agrees to plan moving forward.  Patient performing ambulation throughout session with RW and CGA; 1x44'4, 120'6, and 2x36'8. Patient performing with increased forward trunk lean, downward gaze, increased shoulder shrug with bilat shoulders, decreased step length with Rt LE PT discussing and performing appropriate technique for curb negotiation while using RW. PT verbally educated throughout to include  ambulating up to curb until RW is touching curb, place feet in staggered stance to place RW on curb then stepping up with foot that is placed farther back (currently using stronger LE to step up) for step up onto curb. For step down from curb, patient will move RW close to the edge, use staggered stance, move RW to ground, then place one foot on edge of curb where ball of foot is off, then step down with foot that is placed farther back.  Patient performed curb negotiation two times while switching stepping foot between performances. Patient performing with RW, close CGAx2, and with improved performance when stepping up with Rt and down with Lt.  Patient performing 1 sit>stand from chair without arms to RW using bilat UE on chair and having close CGA x2 and increasing to minAx2 to assist with full upright. Patient attempting to perform from front of chair, but unable, so patient performed at an angle in order to achieve appropriate foot placement and weight shift for stand.   03/20/2024 Prosthetic Care with BKA prosthesis: See patient education below.  PATIENT EDUCATION: PATIENT EDUCATED ON FOLLOWING PROSTHETIC CARE: Education details: Alterations required long pants height modification for safety as prosthetist decreased his overall height by approximately 2 inches, shrinker use when prosthesis is off,  Skin check, Residual limb care, Prosthetic cleaning, Correct ply sock adjustment, Propper donning, Proper doffing, Proper wear schedule/adjustment, and Proper weight-bearing schedule/adjustment Prosthetic wear tolerance: 3 hours 2x/day, 7 days/week.  If after 5 days no unusual skin changes or pressure intolerance noted then can increase to 4 hours 2x/day Person educated: Patient and Spouse Education method: Explanation, Demonstration, Tactile cues, and Verbal cues Education comprehension: verbalized understanding, verbal  cues required, tactile cues required, and needs further education  HOME EXERCISE  PROGRAM:  ASSESSMENT:  CLINICAL IMPRESSION:  Patient arrived to session noting ability to ambulate and navigate different terrains as well as sit<>stands from different heights and surfaces. Patient tolerated all activities with intermittent LOB, though mostly able to self-correct with UE use on RW. Patient will continue to benefit from skilled PT.  OBJECTIVE IMPAIRMENTS: Abnormal gait, decreased activity tolerance, decreased balance, decreased coordination, decreased endurance, decreased knowledge of use of DME, decreased mobility, difficulty walking, decreased ROM, decreased strength, impaired flexibility, improper body mechanics, postural dysfunction, and prosthetic dependency .   ACTIVITY LIMITATIONS: carrying, lifting, bending, sitting, standing, squatting, stairs, transfers, bathing, toileting, dressing, reach over head, hygiene/grooming, and locomotion level  PARTICIPATION LIMITATIONS: meal prep, cleaning, driving, shopping, community activity, occupation, and yard work  PERSONAL FACTORS: Age, Fitness, Past/current experiences, Time since onset of injury/illness/exacerbation, and 3+ comorbidities: see PMH are also affecting patient's functional outcome.   REHAB POTENTIAL: Good  CLINICAL DECISION MAKING: Evolving/moderate complexity  EVALUATION COMPLEXITY: Moderate   GOALS: Goals reviewed with patient? Yes  SHORT TERM GOALS: Target date: 04/19/2024  Patient donnes prosthesis modified independent & verbalizes proper cleaning. Baseline: SEE OBJECTIVE DATA Goal status: ongoing, 04/02/2024 2.  Patient tolerates right prosthesis >10 hrs total /day without skin issues or limb pain. Baseline: SEE OBJECTIVE DATA Goal status: ongoing, 04/02/2024  3.  Patient able to reach 7 and look over both shoulders with RW support with supervision. Baseline: SEE OBJECTIVE DATA Goal status: ongoing, 04/02/2024  4. Patient ambulates 38' with RW & prosthesis with CGA. Baseline: SEE OBJECTIVE  DATA Goal status: ongoing, 04/02/2024  5. Patient transfers sit to/from stand 18 chair with armrest to RW with CGA.  Baseline: SEE OBJECTIVE DATA Goal status: ongoing, 04/02/2024  LONG TERM GOALS: Target date: 06/18/2024  Patient demonstrates & verbalized understanding of prosthetic care to enable safe utilization of prostheses. Baseline: SEE OBJECTIVE DATA Goal status: ongoing, 04/02/2024  Patient tolerates prostheses wear >90% of awake hours without skin or limb pain issues. Baseline: SEE OBJECTIVE DATA Goal status: ongoing, 04/02/2024  Patient ambulates >500' with LRAD and prostheses independently Baseline: SEE OBJECTIVE DATA Goal status: ongoing, 04/02/2024  Patient negotiates ramps, curbs & stairs with single rail with prostheses with LRAD independently. Baseline: SEE OBJECTIVE DATA Goal status: ongoing, 04/02/2024  Patient demonstrates & verbalizes lifting, carrying, pushing, pulling with prostheses only safely. Baseline: SEE OBJECTIVE DATA Goal status: ongoing, 04/02/2024  6.  Patient reports Patient-Specific Activity Score improved the average >/= 6 to indicate improvement in functional activities.   Baseline: SEE OBJECTIVE DATA Goal status: ongoing, 04/02/2024  PLAN:  PT FREQUENCY: 2x/week  PT DURATION: 12 weeks  PLANNED INTERVENTIONS: 97164- PT Re-evaluation, 97750- Physical Performance Testing, 97110-Therapeutic exercises, 97530- Therapeutic activity, 97112- Neuromuscular re-education, 97535- Self Care, [id number]- Manual therapy, [phone]- Gait training, [phone]- Prosthetic Initial , [phone]- Orthotic/Prosthetic subsequent, Patient/Family education, Balance training, Stair training, Scar mobilization, and DME instructions  PLAN FOR NEXT SESSION:   finish BERG (attempt some with/without RW), Review curb/ramp, gait with RW (maybe cane or lofstrand?), and review balance activities  (task of BERG) for home , performing static balance activities within session without UE use    Susannah Daring, PT, DPT 04/09/24 3:47 PM     Referring diagnosis? Z89.511 (ICD-10-CM) - Right below-knee amputee (has history left BKA so now Bil. BKAs) Treatment diagnosis? (if different than referring diagnosis) R26.89, M62.81, M25.661, R26.81 What was this (referring dx) caused by? [x]  Surgery []  Fall []   Ongoing issue []  Arthritis []  Other: ____________  Laterality: []  Rt []  Lt [x]  Both  Check all possible CPT codes:  *CHOOSE 10 OR LESS*    See Planned Interventions listed in the Plan section of the Evaluation.

## 2024-04-09 ENCOUNTER — Ambulatory Visit (INDEPENDENT_AMBULATORY_CARE_PROVIDER_SITE_OTHER)

## 2024-04-09 DIAGNOSIS — M25661 Stiffness of right knee, not elsewhere classified: Secondary | ICD-10-CM

## 2024-04-09 DIAGNOSIS — M6281 Muscle weakness (generalized): Secondary | ICD-10-CM

## 2024-04-09 DIAGNOSIS — R2689 Other abnormalities of gait and mobility: Secondary | ICD-10-CM | POA: Diagnosis not present

## 2024-04-09 DIAGNOSIS — R2681 Unsteadiness on feet: Secondary | ICD-10-CM

## 2024-04-10 NOTE — Therapy (Signed)
 OUTPATIENT PHYSICAL THERAPY PROSTHETIC TREATMENT   Patient Name: Kevin Bradford MRN: [id number] DOB:05/29/1946, 78 y.o., male Today's Date: 04/11/2024  END OF SESSION:  PT End of Session - 04/11/24 1436     Visit Number 6    Number of Visits 24    Date for Recertification  06/18/24    Authorization Type [employer] MEDICARE    Progress Note Due on Visit 10    PT Start Time 1434    PT Stop Time 1515    PT Time Calculation (min) 41 min    Equipment Utilized During Treatment Gait belt    Activity Tolerance Patient tolerated treatment well    Behavior During Therapy WFL for tasks assessed/performed            Past Medical History:  Diagnosis Date   Arthritis    joint pain (08/16/2017)   Below knee amputation (HCC)    Cellulitis and abscess of leg 10/18/2014   CKD (chronic kidney disease) stage 3, GFR 30-59 ml/min (HCC)    creatinine increases with antibiotics per pt, no known kidney disease per patient   Dehiscence of closure of skin, subsequent encounter    Diabetic foot ulcer (HCC)    left   Diabetic peripheral neuropathy (HCC)    GERD (gastroesophageal reflux disease)    12/26/2017- not this year.    Osteomyelitis (HCC)    left transmetatarsal    Stroke (HCC)    Type II diabetes mellitus [facility])    Past Surgical History:  Procedure Laterality Date   AMPUTATION Left 07/19/2017   Procedure: LEFT TRANSMETATARSAL AMPUTATION;  Surgeon: Harden Jerona GAILS, MD;  Location: [facility] OR;  Service: Orthopedics;  Laterality: Left;   AMPUTATION Left 08/16/2017   REVISION LEFT TRANSMETATARSAL AMPUTATION   AMPUTATION Left 11/22/2017   Procedure: LEFT BELOW KNEE AMPUTATION;  Surgeon: Harden Jerona GAILS, MD;  Location: [facility] OR;  Service: Orthopedics;  Laterality: Left;   AMPUTATION Right 12/20/2023   Procedure: AMPUTATION RIGHT BELOW KNEE;  Surgeon: Harden Jerona GAILS, MD;  Location: [facility] OR;  Service: Orthopedics;  Laterality: Right;   CATARACT EXTRACTION W/ INTRAOCULAR LENS  IMPLANT, BILATERAL  ~ 2016   EYE  SURGERY Bilateral    catarct   I & D EXTREMITY Left 10/17/2014   Procedure: IRRIGATION AND DEBRIDEMENT EXTREMITY LEFT FOOT;  Surgeon: Jerona Harden GAILS, MD;  Location: [facility] OR;  Service: Orthopedics;  Laterality: Left;   IR ANGIO INTRA EXTRACRAN SEL COM CAROTID INNOMINATE BILAT MOD SED  05/07/2018   IR ANGIO VERTEBRAL SEL SUBCLAVIAN INNOMINATE BILAT MOD SED  05/07/2018   STUMP REVISION Left 08/16/2017   Procedure: REVISION LEFT TRANSMETATARSAL AMPUTATION;  Surgeon: Harden Jerona GAILS, MD;  Location: [facility] OR;  Service: Orthopedics;  Laterality: Left;   STUMP REVISION Left 12/27/2017   Procedure: LEFT BELOW KNEE AMPUTATION REVISION;  Surgeon: Harden Jerona GAILS, MD;  Location: [facility] OR;  Service: Orthopedics;  Laterality: Left;   TOE AMPUTATION Right 2013   5th toe   Patient Active Problem List   Diagnosis Date Noted   Coping style affecting medical condition 12/29/2023   Malnutrition of moderate degree 12/25/2023   Right below-knee amputee (HCC) 12/25/2023   Diabetic infection of right foot (HCC) 12/16/2023   History of stroke 12/16/2023   Acute osteomyelitis of right calcaneus (HCC) 12/16/2023   Cutaneous abscess of right foot 12/16/2023   DKA, type 2 (HCC) 12/08/2023   Hypokalemia 12/08/2023   S/P BKA (below knee amputation), left (HCC) 12/08/2023   Hyperosmolar hyperglycemic  state (HHS) (HCC) 12/07/2023   Stroke (HCC)    Symptomatic bradycardia 05/06/2018   Postural dizziness with near syncope 05/06/2018   Labile blood glucose    Acute blood loss anemia    Labile blood pressure    CKD (chronic kidney disease) stage 2, GFR 60-89 ml/min    Type 2 diabetes mellitus with peripheral neuropathy (HCC)    Post-operative pain    Amputation of left lower extremity below knee upon examination (HCC) 11/24/2017   Amputated below knee, left (HCC) 11/22/2017   Osteomyelitis of left foot (HCC)    Dehiscence of amputation stump (HCC) 08/11/2017   S/P transmetatarsal amputation of foot, left (HCC) 07/19/2017    Subacute osteomyelitis, left ankle and foot (HCC) 07/17/2017   Diabetic ulcer of left foot associated with type 2 diabetes mellitus (HCC)    Diabetes mellitus with renal complications (HCC) 10/17/2014   Diabetes (HCC) 10/13/2014   Normocytic anemia 10/13/2014   Hyperkalemia 10/13/2014    PCP: Ransom Other, MD  REFERRING PROVIDER: Harden Jerona GAILS, MD  ONSET DATE: prosthesis delivery: 03/15/2024  REFERRING DIAG: S10.488 (ICD-10-CM) - Right below-knee amputee (HCC)   THERAPY DIAG:  Other abnormalities of gait and mobility  Muscle weakness (generalized)  Stiffness of right knee, not elsewhere classified  Unsteadiness on feet  Rationale for Evaluation and Treatment: Rehabilitation  SUBJECTIVE:   SUBJECTIVE STATEMENT:  Patient noting ability to pick up walker and take a few steps. Patient notes ability to also help with groceries.  Pt accompanied by: significant other  PERTINENT HISTORY:  Right TTA 12/20/23, L TTA 2019, DM2, CKD3, PVD, arthritis, peripheral neuropathy, CVA,   Patient is a 78 y.o. male who arrives to PT session with new Rt BKA (12/20/2023) and prostheses (first RLE & new LLE) delivery date 03/15/2024. Patient has a history of previous Lt BKA in 2019. Patient reports being able to stand at sink with UE support. Patient has new prosthesis with vacuum pin suction on both LEs which is new system that he is not familiar with. Patient has progressed wear of Rt prosthesis from 1 hour 2x/day up to 3 hrs, 2x/ day with minimal discomfort.  PAIN:  Are you having pain? No  PRECAUTIONS: None  WEIGHT BEARING RESTRICTIONS: No  FALLS: Has patient fallen in last 6 months? No  LIVING ENVIRONMENT: Lives with: lives with their spouse Lives in: House Home Access: Ramped entrance or by garage which is one step Home layout: Two level, 1/2 bath on main level (not accessible with w/c), and Bed/bath upstairs Stairs: Yes: Internal: 16 steps; on left going up Has following equipment at  home: Single point cane, Environmental consultant - 2 wheeled, Wheelchair (manual), shower chair, and Tour manager  OCCUPATION: retired, works as Art therapist on occasion  PLOF: modified independence  PATIENT GOALS: function with both prostheses at community level, doing yard Corporate treasurer   MD appt: follow up 04/30/2024   OBJECTIVE:  Patient-Specific Activity Scoring Scheme  0 represents "unable to perform." 10 represents "able to perform at prior level. 0 1 2 3 4 5 6 7 8 9  10 (Date and Score)  Activity Eval     1.  walking 0     2. standing ADLs  0    3. drive 0   4.stairs  0   5. Yardwork/handyman work 0   Score 0    Total score = sum of the activity scores/number of activities Minimum detectable change (90%CI) for average score = 2 points Minimum detectable change (  90%CI) for single activity score = 3 points  COGNITION: Overall cognitive status: Within functional limits for tasks assessed   SENSATION: WFL  POSTURE: rounded shoulders, forward head, flexed trunk , and weight shift left  LOWER EXTREMITY ROM:  ROM P:passive  A:active Right eval Left eval  Hip flexion    Hip extension    Hip abduction    Hip adduction    Hip internal rotation    Hip external rotation    Knee flexion    Knee extension Seated  P: -5* Seated  P: 0*  Ankle dorsiflexion    Ankle plantarflexion    Ankle inversion    Ankle eversion     (Blank rows = not tested)  LOWER EXTREMITY MMT:  MMT Right eval Left eval  Hip flexion    Hip extension 3-/5 4-/5  Hip abduction 3/5 4-/5  Hip adduction    Hip internal rotation    Hip external rotation    Knee flexion 3/5   Knee extension 3/5   At Evaluation all strength testing is grossly seated and functionally standing / gait. (Blank rows = not tested)  TRANSFERS: Sit to stand: Mod A, dependent on heavy BUE support 18 chair with armrests, use RW to stabilize to stand upright Stand to sit: Min A with BUE support on armrests to 18  chair, requires RW for stability Lateral scoot transfers: SBA with BUE support and 2 chairs touching same height  FUNCTIONAL TESTs:  Pt requires minA with heavy BUE support on RW for static standing balance.   GAIT: Gait pattern: step to pattern, decreased step length- Right, decreased step length- Left, decreased stance time- Right, decreased hip/knee flexion- Right, Right hip hike, knee flexed in stance- Right, lateral hip instability, trunk flexed, and wide BOS Distance walked: 22' Assistive device utilized: Environmental consultant - 2 wheeled Level of assistance: Mod A   CURRENT PROSTHETIC WEAR ASSESSMENT: 03/20/2024  Patient is dependent with: skin check, residual limb care, prosthetic cleaning, ply sock cleaning, correct ply sock adjustment, proper wear schedule/adjustment, and proper weight-bearing schedule/adjustment Donning prosthesis: Mod A Doffing prosthesis: Mod A Prosthetic wear tolerance: progressed up to 3 hours, 2x/day for last day,  he has worn right prosthesis daily since delivery 6 days ago.  He is wearing Left prosthesis all awake hours.  Prosthetic weight bearing tolerance: pt tolerated standing & gait with RW support for 5 min total with no c/o limb pain.  Edema: RLE pitting with 5-second capillary refill.  None noted in LLE. Residual limb condition:   Right: bone length 7, limb length 8, scar healed, no hair growth, normal skin color and temperature, 1 medial wound 2mm which is clean with no signs of infection, cylindrical shape  Left: conical shape, shrunk with bony landmarks prominent, dark & callused pressure areas at fibula head, tibial tuberosity and lateral tibial plateau.  Prosthetic description: total contact socket with backing pin suction, dynamic response feet  TODAY'S TREATMENT:  DATE:  04/11/2024 Physical Performance:  Patient performed gait speed  analysis with bilat SPC with quad tip and SBA-CGA beginning with 4-point technique and slowly transitioning to 2-point technique.  Self-selected speed: 0.59 ft/s Fast-as-comfortable: 1.19 ft/s Patient completed BERG assessment with RW and CGA. Patient unable to complete tasks without AD due to stability and deficits in tandem and single leg balance. PT discussed performing semi-tandem/tandem stance as well as reciprocal tapping to a step using RW and slowly transitioning to fingertip hold on RW to unilat fingertip hold on RW to hovering above RW without using it as assist, however, to not increase between those holds until completely confident and steady with the one below; patient and spouse acknowledged and agreed.   Prosthetic Care with BKA prosthesis: PT demonstrating and discussing 4-point vs 2-point gait technique with bilat SPC with quad tips as well as lofstrand crutches and giving patient option for which AD he feels more comfortable using. Patient then ambulated with bilat SPC with quad tip with SBA-CGA for 30'10 and 116'8. Patient began both trials of ambulation with 4-point technique and improved to 2-point technique with time and practice and showed improvements with stability and confidence. Patient performed with slight downward gaze and will benefit from performing with forward gaze as well as dual tasking before increasing challenge to unilat [facility] with quad tip.  PT, patient, and patient's spouse discussed fear of falling. PT challenged patient's fwd stepping reaction x3 while patient held onto RW in order to show stability with RW. PT attempted to challenge with bilat SPC with quad tip, though patient too fearful to complete.   TODAY'S TREATMENT:                                                                                                                             DATE:  04/09/2024 Neuro Re-Ed:  Patient performing tossing and catching with ball with CGA>min A from PT. Min A required  intermittently due to posterior LOB. Patient intermittently changing foot placement between typical stance, wide stance, semi-tandem, and narrow stance with no difference in balance with each. Patient performed 40 total tosses with intermittent UE use on RW.  Physical Performance:  PT began assessing BERG on patient using SUP>CGA throughout. Patient intermittently using RW for certain tasks (sit<>stand, transfers) though able to mostly perform balance/reaching activities with no support on RW. Patient requiring intermittent seated rest breaks and using UEs on RW between standing tasks. PT discussed remaining objects of assessment, that were not completed due to time constraints, with patient and whether they will be assessed with RW, without RW, or both. Patient acknowledged and agreed to continue assessment at next session.  Patient, spouse, and PT discussed addition of certain balance tasks to be completed at home and the safest options for performing at home in order to decrease risk of falls.   Prosthetic Care with BKA prosthesis  Patient performing several sit<>stands throughout session with UE use on chairs and  standing to RW. Patient performing with SUP throughout and no required physical assist or verbal cues for appropriate form.  PT discussing potential progression with ambulation by using lofstrand crutches, bilat canes, unilat cane, and eventually no AD. PT discussing importance of balance with ambulation and potential need for bilat AD before progressing to unilat SPC/quad cane. Patient and spouse acknowledge, though patient seems hesitant to use bilat devices.  TODAY'S TREATMENT:                                                                                                                             DATE:  04/05/2024 Prosthetic Care with BKA prosthesis  PT discussed importance of continuing with skin checks when increasing wear-time as patient will be increasing wear-time to 6 hours  2x/day during the weekend. Patient and spouse acknowledged and agreed to complete skin checks when doffing prosthesis.  PT discussed importance of continuing ambulation with RW for short, medium, and long walks for mobility, balance, and endurance. PT also discussed decreasing activity for remainder of day to include mainly short and medium walks to decrease fatigue and residual limb soreness since completing long walk at [employer] as well as 6-min walk test. Patient and spouse acknowledged and agreed.  Patient performing sit<>stands with RW and SUP>SBA throughout session. PT providing verbal cues for walking feet under body once bottom is out of chair in order to increase BOS/center of gravity and decrease imbalance with standing to RW. Patient with appropriate carryover throughout session.  Patient ambulating throughout session with RW and SUP>SBA for 1x52'9 and 58'8. Patient performing with intermittent downward gaze and forward flexed posture. Patient endorses goal of one day returning to ambulation without AD. PT discussed options, progressions, and importance of improving balance/endurance.  Neuro Re-Ed:  Standing reactive balance activity performed with light up pods on window. Patient performed standing taps with one UE on RW and with close SBA.  Typical stance with Rt UE tapping 1x30s  Typical stance with Lt UE tapping 1x30s  Narrow stance with Rt Ue tapping 1x30s  Narrow stance with Lt UE tapping 1x30s  No required increase in physical assistance as well as no increase in postural sway throughout activity ; will benefit from no UE on RW throughout activity  PT discussing importance of improving balance for overall mobility and how cross body movements with one UE on RW challenges balance   Physical Performance:  Patient performed 6-minute walk test this date in order to assess cardiovascular endurance. Patient was able to ambulate 491'4 with RW and SUP>SBA. Patient vitals taken at end of  assessment with SpO2 being 97% and HR being 100bpm.       TODAY'S TREATMENT:  DATE:  04/02/2024 Prosthetic Care with BKA prosthesis: PT verbally educated patient about short, medium, and long walks to build up walking endurance. 1-2 long walks, 4+ medium walks, and high frequency of short walks. Additionally, PT verbally educated and demo about how to safely do a wheelchair follow with CGA. Patient and significant other verbalized understanding  Patient performed STS with UE regression. Initially w/ both UE on arm rest, then one UE on armrest and one UE on seat of chair, then both UE on seat of the chair. Patient had most difficulty with UE on seat of the chair due to have to reach further up with UE to stabilize with RW  Neuromuscular Re-education: working on balance Patient performed sit to stand and stood up straight with no RW for stabilization Patient reached forward 2-3 with single UE support of RW  Patient looked behind shoulder with single opposite UE support on walker. Done bilaterally Patient stood with RW support w/ eyes closed for 10 seconds. Done with single UE support for 10 seconds each. Patient had most difficult time and occasionally touched RW for re-stabilization with Lt UE support Reciprocal toe tapping with RW support. Initially with bilateral UE support and progressed to single UE support. Had the most difficulty with Lt UE support.  Patient stepped forward into a semi tandem stance with RW support. Balanced with single UE support Patient stepped to and over 4 step with RW support. Focused weightbearing onto leg stepping up and eccentric loading on stepping down.     PATIENT EDUCATION: PATIENT EDUCATED ON FOLLOWING PROSTHETIC CARE: Education details: Alterations required long pants height modification for safety as prosthetist decreased his overall  height by approximately 2 inches, shrinker use when prosthesis is off,  Skin check, Residual limb care, Prosthetic cleaning, Correct ply sock adjustment, Propper donning, Proper doffing, Proper wear schedule/adjustment, and Proper weight-bearing schedule/adjustment Prosthetic wear tolerance: 3 hours 2x/day, 7 days/week.  If after 5 days no unusual skin changes or pressure intolerance noted then can increase to 4 hours 2x/day Person educated: Patient and Spouse Education method: Explanation, Demonstration, Tactile cues, and Verbal cues Education comprehension: verbalized understanding, verbal cues required, tactile cues required, and needs further education  HOME EXERCISE PROGRAM:  ASSESSMENT:  CLINICAL IMPRESSION:  Patient arrived to session noting improved performance with ambulation and ADLs. Patient tolerated all activities this date, though has slight anxiety about increased challenge/decreased stability with bilat SPC with quad tips during ambulation. Patient and spouse agreed to challenge activity and balance within the home in a safe manner (with her around, RW near, and a chair nearby) in order to improve balance and stability. Patient will continue to benefit from skilled PT.   OBJECTIVE IMPAIRMENTS: Abnormal gait, decreased activity tolerance, decreased balance, decreased coordination, decreased endurance, decreased knowledge of use of DME, decreased mobility, difficulty walking, decreased ROM, decreased strength, impaired flexibility, improper body mechanics, postural dysfunction, and prosthetic dependency .   ACTIVITY LIMITATIONS: carrying, lifting, bending, sitting, standing, squatting, stairs, transfers, bathing, toileting, dressing, reach over head, hygiene/grooming, and locomotion level  PARTICIPATION LIMITATIONS: meal prep, cleaning, driving, shopping, community activity, occupation, and yard work  PERSONAL FACTORS: Age, Fitness, Past/current experiences, Time since onset of  injury/illness/exacerbation, and 3+ comorbidities: see PMH are also affecting patient's functional outcome.   REHAB POTENTIAL: Good  CLINICAL DECISION MAKING: Evolving/moderate complexity  EVALUATION COMPLEXITY: Moderate   GOALS: Goals reviewed with patient? Yes  SHORT TERM GOALS: Target date: 04/19/2024  Patient donnes prosthesis modified independent & verbalizes proper cleaning. Baseline:  SEE OBJECTIVE DATA Goal status: ongoing, 04/02/2024 2.  Patient tolerates right prosthesis >10 hrs total /day without skin issues or limb pain. Baseline: SEE OBJECTIVE DATA Goal status: ongoing, 04/02/2024  3.  Patient able to reach 7 and look over both shoulders with RW support with supervision. Baseline: SEE OBJECTIVE DATA Goal status: ongoing, 04/02/2024  4. Patient ambulates 48' with RW & prosthesis with CGA. Baseline: SEE OBJECTIVE DATA Goal status: ongoing, 04/02/2024  5. Patient transfers sit to/from stand 18 chair with armrest to RW with CGA.  Baseline: SEE OBJECTIVE DATA Goal status: ongoing, 04/02/2024  LONG TERM GOALS: Target date: 06/18/2024  Patient demonstrates & verbalized understanding of prosthetic care to enable safe utilization of prostheses. Baseline: SEE OBJECTIVE DATA Goal status: ongoing, 04/02/2024  Patient tolerates prostheses wear >90% of awake hours without skin or limb pain issues. Baseline: SEE OBJECTIVE DATA Goal status: ongoing, 04/02/2024  Patient ambulates >500' with LRAD and prostheses independently Baseline: SEE OBJECTIVE DATA Goal status: ongoing, 04/02/2024  Patient negotiates ramps, curbs & stairs with single rail with prostheses with LRAD independently. Baseline: SEE OBJECTIVE DATA Goal status: ongoing, 04/02/2024  Patient demonstrates & verbalizes lifting, carrying, pushing, pulling with prostheses only safely. Baseline: SEE OBJECTIVE DATA Goal status: ongoing, 04/02/2024  6.  Patient reports Patient-Specific Activity Score improved the average  >/= 6 to indicate improvement in functional activities.   Baseline: SEE OBJECTIVE DATA Goal status: ongoing, 04/02/2024  PLAN:  PT FREQUENCY: 2x/week  PT DURATION: 12 weeks  PLANNED INTERVENTIONS: 97164- PT Re-evaluation, 97750- Physical Performance Testing, 97110-Therapeutic exercises, 97530- Therapeutic activity, 97112- Neuromuscular re-education, 97535- Self Care, [id number]- Manual therapy, [phone]- Gait training, [phone]- Prosthetic Initial , [phone]- Orthotic/Prosthetic subsequent, Patient/Family education, Balance training, Stair training, Scar mobilization, and DME instructions  PLAN FOR NEXT SESSION:   Review curb/ramp, gait with RW (maybe cane or lofstrand?), and review balance activities  (task of BERG) for home , performing static balance activities within session without UE use, ambulation with challenging AD    Susannah Daring, PT, DPT 04/11/24 4:40 PM     Referring diagnosis? Z89.511 (ICD-10-CM) - Right below-knee amputee (has history left BKA so now Bil. BKAs) Treatment diagnosis? (if different than referring diagnosis) R26.89, M62.81, M25.661, R26.81 What was this (referring dx) caused by? [x]  Surgery []  Fall []  Ongoing issue []  Arthritis []  Other: ____________  Laterality: []  Rt []  Lt [x]  Both  Check all possible CPT codes:  *CHOOSE 10 OR LESS*    See Planned Interventions listed in the Plan section of the Evaluation.

## 2024-04-11 ENCOUNTER — Ambulatory Visit (INDEPENDENT_AMBULATORY_CARE_PROVIDER_SITE_OTHER)

## 2024-04-11 DIAGNOSIS — M6281 Muscle weakness (generalized): Secondary | ICD-10-CM | POA: Diagnosis not present

## 2024-04-11 DIAGNOSIS — R2681 Unsteadiness on feet: Secondary | ICD-10-CM

## 2024-04-11 DIAGNOSIS — M25661 Stiffness of right knee, not elsewhere classified: Secondary | ICD-10-CM | POA: Diagnosis not present

## 2024-04-11 DIAGNOSIS — R2689 Other abnormalities of gait and mobility: Secondary | ICD-10-CM

## 2024-04-16 ENCOUNTER — Ambulatory Visit (INDEPENDENT_AMBULATORY_CARE_PROVIDER_SITE_OTHER): Admitting: Physical Therapy

## 2024-04-16 ENCOUNTER — Encounter: Payer: Self-pay | Admitting: Physical Therapy

## 2024-04-16 DIAGNOSIS — M6281 Muscle weakness (generalized): Secondary | ICD-10-CM

## 2024-04-16 DIAGNOSIS — R2681 Unsteadiness on feet: Secondary | ICD-10-CM

## 2024-04-16 DIAGNOSIS — M25661 Stiffness of right knee, not elsewhere classified: Secondary | ICD-10-CM | POA: Diagnosis not present

## 2024-04-16 DIAGNOSIS — R2689 Other abnormalities of gait and mobility: Secondary | ICD-10-CM

## 2024-04-16 NOTE — Therapy (Signed)
 OUTPATIENT PHYSICAL THERAPY PROSTHETIC TREATMENT   Patient Name: Kevin Bradford MRN: [id number] DOB:1946/07/17, 78 y.o., male Today's Date: 04/16/2024  END OF SESSION:  PT End of Session - 04/16/24 1434     Visit Number 7    Number of Visits 24    Date for Recertification  06/18/24    Authorization Type [employer] MEDICARE    Progress Note Due on Visit 10    PT Start Time 1430    PT Stop Time 1515    PT Time Calculation (min) 45 min    Equipment Utilized During Treatment Gait belt    Activity Tolerance Patient tolerated treatment well    Behavior During Therapy WFL for tasks assessed/performed             Past Medical History:  Diagnosis Date   Arthritis    joint pain (08/16/2017)   Below knee amputation (HCC)    Cellulitis and abscess of leg 10/18/2014   CKD (chronic kidney disease) stage 3, GFR 30-59 ml/min (HCC)    creatinine increases with antibiotics per pt, no known kidney disease per patient   Dehiscence of closure of skin, subsequent encounter    Diabetic foot ulcer (HCC)    left   Diabetic peripheral neuropathy (HCC)    GERD (gastroesophageal reflux disease)    12/26/2017- not this year.    Osteomyelitis (HCC)    left transmetatarsal    Stroke (HCC)    Type II diabetes mellitus [facility])    Past Surgical History:  Procedure Laterality Date   AMPUTATION Left 07/19/2017   Procedure: LEFT TRANSMETATARSAL AMPUTATION;  Surgeon: Harden Jerona GAILS, MD;  Location: [facility] OR;  Service: Orthopedics;  Laterality: Left;   AMPUTATION Left 08/16/2017   REVISION LEFT TRANSMETATARSAL AMPUTATION   AMPUTATION Left 11/22/2017   Procedure: LEFT BELOW KNEE AMPUTATION;  Surgeon: Harden Jerona GAILS, MD;  Location: [facility] OR;  Service: Orthopedics;  Laterality: Left;   AMPUTATION Right 12/20/2023   Procedure: AMPUTATION RIGHT BELOW KNEE;  Surgeon: Harden Jerona GAILS, MD;  Location: [facility] OR;  Service: Orthopedics;  Laterality: Right;   CATARACT EXTRACTION W/ INTRAOCULAR LENS  IMPLANT, BILATERAL  ~ 2016    EYE SURGERY Bilateral    catarct   I & D EXTREMITY Left 10/17/2014   Procedure: IRRIGATION AND DEBRIDEMENT EXTREMITY LEFT FOOT;  Surgeon: Jerona Harden GAILS, MD;  Location: [facility] OR;  Service: Orthopedics;  Laterality: Left;   IR ANGIO INTRA EXTRACRAN SEL COM CAROTID INNOMINATE BILAT MOD SED  05/07/2018   IR ANGIO VERTEBRAL SEL SUBCLAVIAN INNOMINATE BILAT MOD SED  05/07/2018   STUMP REVISION Left 08/16/2017   Procedure: REVISION LEFT TRANSMETATARSAL AMPUTATION;  Surgeon: Harden Jerona GAILS, MD;  Location: [facility] OR;  Service: Orthopedics;  Laterality: Left;   STUMP REVISION Left 12/27/2017   Procedure: LEFT BELOW KNEE AMPUTATION REVISION;  Surgeon: Harden Jerona GAILS, MD;  Location: [facility] OR;  Service: Orthopedics;  Laterality: Left;   TOE AMPUTATION Right 2013   5th toe   Patient Active Problem List   Diagnosis Date Noted   Coping style affecting medical condition 12/29/2023   Malnutrition of moderate degree 12/25/2023   Right below-knee amputee (HCC) 12/25/2023   Diabetic infection of right foot (HCC) 12/16/2023   History of stroke 12/16/2023   Acute osteomyelitis of right calcaneus (HCC) 12/16/2023   Cutaneous abscess of right foot 12/16/2023   DKA, type 2 (HCC) 12/08/2023   Hypokalemia 12/08/2023   S/P BKA (below knee amputation), left (HCC) 12/08/2023   Hyperosmolar  hyperglycemic state (HHS) (HCC) 12/07/2023   Stroke (HCC)    Symptomatic bradycardia 05/06/2018   Postural dizziness with near syncope 05/06/2018   Labile blood glucose    Acute blood loss anemia    Labile blood pressure    CKD (chronic kidney disease) stage 2, GFR 60-89 ml/min    Type 2 diabetes mellitus with peripheral neuropathy (HCC)    Post-operative pain    Amputation of left lower extremity below knee upon examination (HCC) 11/24/2017   Amputated below knee, left (HCC) 11/22/2017   Osteomyelitis of left foot (HCC)    Dehiscence of amputation stump (HCC) 08/11/2017   S/P transmetatarsal amputation of foot, left (HCC) 07/19/2017    Subacute osteomyelitis, left ankle and foot (HCC) 07/17/2017   Diabetic ulcer of left foot associated with type 2 diabetes mellitus (HCC)    Diabetes mellitus with renal complications (HCC) 10/17/2014   Diabetes (HCC) 10/13/2014   Normocytic anemia 10/13/2014   Hyperkalemia 10/13/2014    PCP: Ransom Other, MD  REFERRING PROVIDER: Harden Jerona GAILS, MD  ONSET DATE: prosthesis delivery: 03/15/2024  REFERRING DIAG: S10.488 (ICD-10-CM) - Right below-knee amputee (HCC)   THERAPY DIAG:  Other abnormalities of gait and mobility  Muscle weakness (generalized)  Stiffness of right knee, not elsewhere classified  Unsteadiness on feet  Rationale for Evaluation and Treatment: Rehabilitation  SUBJECTIVE:   SUBJECTIVE STATEMENT:  He is wearing prostheses most of awake hours without issues.  Walking with walker a lot of steps.   Pt accompanied by: significant other  PERTINENT HISTORY:  Right TTA 12/20/23, L TTA 2019, DM2, CKD3, PVD, arthritis, peripheral neuropathy, CVA,   Patient is a 78 y.o. male who arrives to PT session with new Rt BKA (12/20/2023) and prostheses (first RLE & new LLE) delivery date 03/15/2024. Patient has a history of previous Lt BKA in 2019. Patient reports being able to stand at sink with UE support. Patient has new prosthesis with vacuum pin suction on both LEs which is new system that he is not familiar with. Patient has progressed wear of Rt prosthesis from 1 hour 2x/day up to 3 hrs, 2x/ day with minimal discomfort.  PAIN:  Are you having pain? No  PRECAUTIONS: None  WEIGHT BEARING RESTRICTIONS: No  FALLS: Has patient fallen in last 6 months? No  LIVING ENVIRONMENT: Lives with: lives with their spouse Lives in: House Home Access: Ramped entrance or by garage which is one step Home layout: Two level, 1/2 bath on main level (not accessible with w/c), and Bed/bath upstairs Stairs: Yes: Internal: 16 steps; on left going up Has following equipment at home: Single  point cane, Environmental consultant - 2 wheeled, Wheelchair (manual), shower chair, and Tour manager  OCCUPATION: retired, works as Art therapist on occasion  PLOF: modified independence  PATIENT GOALS: function with both prostheses at community level, doing yard Corporate treasurer   MD appt: follow up 04/30/2024   OBJECTIVE:  Patient-Specific Activity Scoring Scheme  0 represents "unable to perform." 10 represents "able to perform at prior level. 0 1 2 3 4 5 6 7 8 9  10 (Date and Score)  Activity Eval  03/20/24    1.  walking 0     2. standing ADLs  0    3. drive 0   4.stairs  0   5. Yardwork/handyman work 0   Score 0    Total score = sum of the activity scores/number of activities Minimum detectable change (90%CI) for average score = 2 points Minimum detectable  change (90%CI) for single activity score = 3 points  COGNITION: Overall cognitive status: Within functional limits for tasks assessed   SENSATION: WFL  POSTURE: rounded shoulders, forward head, flexed trunk , and weight shift left  LOWER EXTREMITY ROM:  ROM P:passive  A:active Right eval Left eval  Hip flexion    Hip extension    Hip abduction    Hip adduction    Hip internal rotation    Hip external rotation    Knee flexion    Knee extension Seated  P: -5* Seated  P: 0*  Ankle dorsiflexion    Ankle plantarflexion    Ankle inversion    Ankle eversion     (Blank rows = not tested)  LOWER EXTREMITY MMT:  MMT Right eval Left eval  Hip flexion    Hip extension 3-/5 4-/5  Hip abduction 3/5 4-/5  Hip adduction    Hip internal rotation    Hip external rotation    Knee flexion 3/5   Knee extension 3/5   At Evaluation all strength testing is grossly seated and functionally standing / gait. (Blank rows = not tested)  TRANSFERS: Sit to stand: Mod A, dependent on heavy BUE support 18 chair with armrests, use RW to stabilize to stand upright Stand to sit: Min A with BUE support on armrests to 18 chair,  requires RW for stability Lateral scoot transfers: SBA with BUE support and 2 chairs touching same height  FUNCTIONAL TESTs:  Pt requires minA with heavy BUE support on RW for static standing balance.   GAIT: Gait pattern: step to pattern, decreased step length- Right, decreased step length- Left, decreased stance time- Right, decreased hip/knee flexion- Right, Right hip hike, knee flexed in stance- Right, lateral hip instability, trunk flexed, and wide BOS Distance walked: 22' Assistive device utilized: Environmental consultant - 2 wheeled Level of assistance: Mod A   CURRENT PROSTHETIC WEAR ASSESSMENT: 03/20/2024  Patient is dependent with: skin check, residual limb care, prosthetic cleaning, ply sock cleaning, correct ply sock adjustment, proper wear schedule/adjustment, and proper weight-bearing schedule/adjustment Donning prosthesis: Mod A Doffing prosthesis: Mod A Prosthetic wear tolerance: progressed up to 3 hours, 2x/day for last day,  he has worn right prosthesis daily since delivery 6 days ago.  He is wearing Left prosthesis all awake hours.  Prosthetic weight bearing tolerance: pt tolerated standing & gait with RW support for 5 min total with no c/o limb pain.  Edema: RLE pitting with 5-second capillary refill.  None noted in LLE. Residual limb condition:   Right: bone length 7, limb length 8, scar healed, no hair growth, normal skin color and temperature, 1 medial wound 2mm which is clean with no signs of infection, cylindrical shape  Left: conical shape, shrunk with bony landmarks prominent, dark & callused pressure areas at fibula head, tibial tuberosity and lateral tibial plateau.  Prosthetic description: total contact socket with backing pin suction, dynamic response feet  TODAY'S TREATMENT:  DATE:  04/16/2024 Prosthetic Care with BKA prosthesis: Pt amb 300' and 100' x 2  with 2 canes initiation of gait with 4-point pattern progressing to 2-point pattern.  PT demo & verbal cues on rationale for this gait to facilitate counter trunk rotation / balance. Pt verbalized understanding.  PT demo & verbal cues on driving with RLE TTA prosthesis.  Options of 2-foot driving, RLE moving gas/brake or hand controls.  PT recommended sitting behind wheel of car in park moving feet, then progressing to middle of large empty parking lot working various parking options & backing up.  Pt and wife verbalized understanding.   Stairs: descending and ascending with step-to pattern alternating lead LE - 7 steps with 2 rails, 2 steps with RUE rail/LUE cane and 2 step LUE rail/RUE cane. PT demo, verbal & tactile cues on technique.     TREATMENT:                                                                                                                             DATE:  04/11/2024 Physical Performance:  Patient performed gait speed analysis with bilat SPC with quad tip and SBA-CGA beginning with 4-point technique and slowly transitioning to 2-point technique.  Self-selected speed: 0.59 ft/s Fast-as-comfortable: 1.19 ft/s Patient completed BERG assessment with RW and CGA. Patient unable to complete tasks without AD due to stability and deficits in tandem and single leg balance. PT discussed performing semi-tandem/tandem stance as well as reciprocal tapping to a step using RW and slowly transitioning to fingertip hold on RW to unilat fingertip hold on RW to hovering above RW without using it as assist, however, to not increase between those holds until completely confident and steady with the one below; patient and spouse acknowledged and agreed.   Prosthetic Care with BKA prosthesis: PT demonstrating and discussing 4-point vs 2-point gait technique with bilat SPC with quad tips as well as lofstrand crutches and giving patient option for which AD he feels more comfortable using. Patient then  ambulated with bilat SPC with quad tip with SBA-CGA for 30'10 and 116'8. Patient began both trials of ambulation with 4-point technique and improved to 2-point technique with time and practice and showed improvements with stability and confidence. Patient performed with slight downward gaze and will benefit from performing with forward gaze as well as dual tasking before increasing challenge to unilat [facility] with quad tip.  PT, patient, and patient's spouse discussed fear of falling. PT challenged patient's fwd stepping reaction x3 while patient held onto RW in order to show stability with RW. PT attempted to challenge with bilat SPC with quad tip, though patient too fearful to complete.     TREATMENT:  DATE:  04/09/2024 Neuro Re-Ed:  Patient performing tossing and catching with ball with CGA>min A from PT. Min A required intermittently due to posterior LOB. Patient intermittently changing foot placement between typical stance, wide stance, semi-tandem, and narrow stance with no difference in balance with each. Patient performed 40 total tosses with intermittent UE use on RW.  Physical Performance:  PT began assessing BERG on patient using SUP>CGA throughout. Patient intermittently using RW for certain tasks (sit<>stand, transfers) though able to mostly perform balance/reaching activities with no support on RW. Patient requiring intermittent seated rest breaks and using UEs on RW between standing tasks. PT discussed remaining objects of assessment, that were not completed due to time constraints, with patient and whether they will be assessed with RW, without RW, or both. Patient acknowledged and agreed to continue assessment at next session.  Patient, spouse, and PT discussed addition of certain balance tasks to be completed at home and the safest options for performing at home in  order to decrease risk of falls.   Prosthetic Care with BKA prosthesis  Patient performing several sit<>stands throughout session with UE use on chairs and standing to RW. Patient performing with SUP throughout and no required physical assist or verbal cues for appropriate form.  PT discussing potential progression with ambulation by using lofstrand crutches, bilat canes, unilat cane, and eventually no AD. PT discussing importance of balance with ambulation and potential need for bilat AD before progressing to unilat SPC/quad cane. Patient and spouse acknowledge, though patient seems hesitant to use bilat devices.   TREATMENT:                                                                                                                             DATE:  04/05/2024 Prosthetic Care with BKA prosthesis  PT discussed importance of continuing with skin checks when increasing wear-time as patient will be increasing wear-time to 6 hours 2x/day during the weekend. Patient and spouse acknowledged and agreed to complete skin checks when doffing prosthesis.  PT discussed importance of continuing ambulation with RW for short, medium, and long walks for mobility, balance, and endurance. PT also discussed decreasing activity for remainder of day to include mainly short and medium walks to decrease fatigue and residual limb soreness since completing long walk at [employer] as well as 6-min walk test. Patient and spouse acknowledged and agreed.  Patient performing sit<>stands with RW and SUP>SBA throughout session. PT providing verbal cues for walking feet under body once bottom is out of chair in order to increase BOS/center of gravity and decrease imbalance with standing to RW. Patient with appropriate carryover throughout session.  Patient ambulating throughout session with RW and SUP>SBA for 1x52'9 and 58'8. Patient performing with intermittent downward gaze and forward flexed posture. Patient endorses goal of one  day returning to ambulation without AD. PT discussed options, progressions, and importance of improving balance/endurance.  Neuro Re-Ed:  Standing reactive balance activity performed with light up  pods on window. Patient performed standing taps with one UE on RW and with close SBA.  Typical stance with Rt UE tapping 1x30s  Typical stance with Lt UE tapping 1x30s  Narrow stance with Rt Ue tapping 1x30s  Narrow stance with Lt UE tapping 1x30s  No required increase in physical assistance as well as no increase in postural sway throughout activity ; will benefit from no UE on RW throughout activity  PT discussing importance of improving balance for overall mobility and how cross body movements with one UE on RW challenges balance   Physical Performance:  Patient performed 6-minute walk test this date in order to assess cardiovascular endurance. Patient was able to ambulate 491'4 with RW and SUP>SBA. Patient vitals taken at end of assessment with SpO2 being 97% and HR being 100bpm.   PATIENT EDUCATION: PATIENT EDUCATED ON FOLLOWING PROSTHETIC CARE: Education details: Alterations required long pants height modification for safety as prosthetist decreased his overall height by approximately 2 inches, shrinker use when prosthesis is off,  Skin check, Residual limb care, Prosthetic cleaning, Correct ply sock adjustment, Propper donning, Proper doffing, Proper wear schedule/adjustment, and Proper weight-bearing schedule/adjustment Prosthetic wear tolerance: 3 hours 2x/day, 7 days/week.  If after 5 days no unusual skin changes or pressure intolerance noted then can increase to 4 hours 2x/day Person educated: Patient and Spouse Education method: Explanation, Demonstration, Tactile cues, and Verbal cues Education comprehension: verbalized understanding, verbal cues required, tactile cues required, and needs further education  HOME EXERCISE PROGRAM:  ASSESSMENT:  CLINICAL IMPRESSION:  Patient improved  gait with canes which has less UE weight bearing than RW & facilitates proper trunk movements.  He was able to negotiate stairs with step-to pattern alternating lead LE.  Patient will continues to benefit from skilled PT.   OBJECTIVE IMPAIRMENTS: Abnormal gait, decreased activity tolerance, decreased balance, decreased coordination, decreased endurance, decreased knowledge of use of DME, decreased mobility, difficulty walking, decreased ROM, decreased strength, impaired flexibility, improper body mechanics, postural dysfunction, and prosthetic dependency .   ACTIVITY LIMITATIONS: carrying, lifting, bending, sitting, standing, squatting, stairs, transfers, bathing, toileting, dressing, reach over head, hygiene/grooming, and locomotion level  PARTICIPATION LIMITATIONS: meal prep, cleaning, driving, shopping, community activity, occupation, and yard work  PERSONAL FACTORS: Age, Fitness, Past/current experiences, Time since onset of injury/illness/exacerbation, and 3+ comorbidities: see PMH are also affecting patient's functional outcome.   REHAB POTENTIAL: Good  CLINICAL DECISION MAKING: Evolving/moderate complexity  EVALUATION COMPLEXITY: Moderate   GOALS: Goals reviewed with patient? Yes  SHORT TERM GOALS: Target date: 04/19/2024  Patient donnes prosthesis modified independent & verbalizes proper cleaning. Baseline: SEE OBJECTIVE DATA Goal status: MET   04/16/2024 2.  Patient tolerates right prosthesis >10 hrs total /day without skin issues or limb pain. Baseline: SEE OBJECTIVE DATA Goal status:  MET 04/16/2024  3.  Patient able to reach 7 and look over both shoulders with RW support with supervision. Baseline: SEE OBJECTIVE DATA Goal status: ongoing   04/16/2024  4. Patient ambulates 60' with RW & prosthesis with CGA. Baseline: SEE OBJECTIVE DATA Goal status: MET   04/16/2024  5. Patient transfers sit to/from stand 18 chair with armrest to RW with CGA.  Baseline: SEE OBJECTIVE  DATA Goal status: MET   04/16/2024  LONG TERM GOALS: Target date: 06/18/2024  Patient demonstrates & verbalized understanding of prosthetic care to enable safe utilization of prostheses. Baseline: SEE OBJECTIVE DATA Goal status: ongoing   04/16/2024  Patient tolerates prostheses wear >90% of awake hours without skin or  limb pain issues. Baseline: SEE OBJECTIVE DATA Goal status: ongoing   04/16/2024  Patient ambulates >500' with LRAD and prostheses independently Baseline: SEE OBJECTIVE DATA Goal status: ongoing   04/16/2024  Patient negotiates ramps, curbs & stairs with single rail with prostheses with LRAD independently. Baseline: SEE OBJECTIVE DATA Goal status: ongoing   04/16/2024  Patient demonstrates & verbalizes lifting, carrying, pushing, pulling with prostheses only safely. Baseline: SEE OBJECTIVE DATA Goal status: ongoing   04/16/2024  6.  Patient reports Patient-Specific Activity Score improved the average >/= 6 to indicate improvement in functional activities.   Baseline: SEE OBJECTIVE DATA Goal status: ongoing   04/16/2024  PLAN:  PT FREQUENCY: 2x/week  PT DURATION: 12 weeks  PLANNED INTERVENTIONS: 97164- PT Re-evaluation, 97750- Physical Performance Testing, 97110-Therapeutic exercises, 97530- Therapeutic activity, [id number]- Neuromuscular re-education, 97535- Self Care, [id number]- Manual therapy, [phone]- Gait training, [phone]- Prosthetic Initial , [phone]- Orthotic/Prosthetic subsequent, Patient/Family education, Balance training, Stair training, Scar mobilization, and DME instructions  PLAN FOR NEXT SESSION:  check remaining STG,  work on standing balance with UE resistance bands.    Grayce Spatz, PT, DPT 04/16/2024, 4:51 PM    Referring diagnosis? Z89.511 (ICD-10-CM) - Right below-knee amputee (has history left BKA so now Bil. BKAs) Treatment diagnosis? (if different than referring diagnosis) R26.89, M62.81, M25.661, R26.81 What was this (referring dx) caused by? [x]   Surgery []  Fall []  Ongoing issue []  Arthritis []  Other: ____________  Laterality: []  Rt []  Lt [x]  Both  Check all possible CPT codes:  *CHOOSE 10 OR LESS*    See Planned Interventions listed in the Plan section of the Evaluation.

## 2024-04-17 NOTE — Therapy (Signed)
 OUTPATIENT PHYSICAL THERAPY PROSTHETIC TREATMENT   Patient Name: Kevin Bradford MRN: [id number] DOB:31-Jul-1945, 78 y.o., male Today's Date: 04/18/2024  END OF SESSION:  PT End of Session - 04/18/24 1409     Visit Number 8    Number of Visits 24    Date for Recertification  06/18/24    Authorization Type [employer] MEDICARE    Progress Note Due on Visit 10    PT Start Time 1435    PT Stop Time 1520    PT Time Calculation (min) 45 min    Equipment Utilized During Treatment Gait belt    Activity Tolerance Patient tolerated treatment well    Behavior During Therapy WFL for tasks assessed/performed              Past Medical History:  Diagnosis Date   Arthritis    joint pain (08/16/2017)   Below knee amputation (HCC)    Cellulitis and abscess of leg 10/18/2014   CKD (chronic kidney disease) stage 3, GFR 30-59 ml/min (HCC)    creatinine increases with antibiotics per pt, no known kidney disease per patient   Dehiscence of closure of skin, subsequent encounter    Diabetic foot ulcer (HCC)    left   Diabetic peripheral neuropathy (HCC)    GERD (gastroesophageal reflux disease)    12/26/2017- not this year.    Osteomyelitis (HCC)    left transmetatarsal    Stroke (HCC)    Type II diabetes mellitus [facility])    Past Surgical History:  Procedure Laterality Date   AMPUTATION Left 07/19/2017   Procedure: LEFT TRANSMETATARSAL AMPUTATION;  Surgeon: Harden Jerona GAILS, MD;  Location: [facility] OR;  Service: Orthopedics;  Laterality: Left;   AMPUTATION Left 08/16/2017   REVISION LEFT TRANSMETATARSAL AMPUTATION   AMPUTATION Left 11/22/2017   Procedure: LEFT BELOW KNEE AMPUTATION;  Surgeon: Harden Jerona GAILS, MD;  Location: [facility] OR;  Service: Orthopedics;  Laterality: Left;   AMPUTATION Right 12/20/2023   Procedure: AMPUTATION RIGHT BELOW KNEE;  Surgeon: Harden Jerona GAILS, MD;  Location: [facility] OR;  Service: Orthopedics;  Laterality: Right;   CATARACT EXTRACTION W/ INTRAOCULAR LENS  IMPLANT, BILATERAL  ~ 2016    EYE SURGERY Bilateral    catarct   I & D EXTREMITY Left 10/17/2014   Procedure: IRRIGATION AND DEBRIDEMENT EXTREMITY LEFT FOOT;  Surgeon: Jerona Harden GAILS, MD;  Location: [facility] OR;  Service: Orthopedics;  Laterality: Left;   IR ANGIO INTRA EXTRACRAN SEL COM CAROTID INNOMINATE BILAT MOD SED  05/07/2018   IR ANGIO VERTEBRAL SEL SUBCLAVIAN INNOMINATE BILAT MOD SED  05/07/2018   STUMP REVISION Left 08/16/2017   Procedure: REVISION LEFT TRANSMETATARSAL AMPUTATION;  Surgeon: Harden Jerona GAILS, MD;  Location: [facility] OR;  Service: Orthopedics;  Laterality: Left;   STUMP REVISION Left 12/27/2017   Procedure: LEFT BELOW KNEE AMPUTATION REVISION;  Surgeon: Harden Jerona GAILS, MD;  Location: [facility] OR;  Service: Orthopedics;  Laterality: Left;   TOE AMPUTATION Right 2013   5th toe   Patient Active Problem List   Diagnosis Date Noted   Coping style affecting medical condition 12/29/2023   Malnutrition of moderate degree 12/25/2023   Right below-knee amputee (HCC) 12/25/2023   Diabetic infection of right foot (HCC) 12/16/2023   History of stroke 12/16/2023   Acute osteomyelitis of right calcaneus (HCC) 12/16/2023   Cutaneous abscess of right foot 12/16/2023   DKA, type 2 (HCC) 12/08/2023   Hypokalemia 12/08/2023   S/P BKA (below knee amputation), left (HCC) 12/08/2023  Hyperosmolar hyperglycemic state (HHS) (HCC) 12/07/2023   Stroke (HCC)    Symptomatic bradycardia 05/06/2018   Postural dizziness with near syncope 05/06/2018   Labile blood glucose    Acute blood loss anemia    Labile blood pressure    CKD (chronic kidney disease) stage 2, GFR 60-89 ml/min    Type 2 diabetes mellitus with peripheral neuropathy (HCC)    Post-operative pain    Amputation of left lower extremity below knee upon examination (HCC) 11/24/2017   Amputated below knee, left (HCC) 11/22/2017   Osteomyelitis of left foot (HCC)    Dehiscence of amputation stump (HCC) 08/11/2017   S/P transmetatarsal amputation of foot, left (HCC) 07/19/2017    Subacute osteomyelitis, left ankle and foot (HCC) 07/17/2017   Diabetic ulcer of left foot associated with type 2 diabetes mellitus (HCC)    Diabetes mellitus with renal complications (HCC) 10/17/2014   Diabetes (HCC) 10/13/2014   Normocytic anemia 10/13/2014   Hyperkalemia 10/13/2014    PCP: Ransom Other, MD  REFERRING PROVIDER: Harden Jerona GAILS, MD  ONSET DATE: prosthesis delivery: 03/15/2024  REFERRING DIAG: S10.488 (ICD-10-CM) - Right below-knee amputee (HCC)   THERAPY DIAG:  Other abnormalities of gait and mobility  Muscle weakness (generalized)  Unsteadiness on feet  Stiffness of right knee, not elsewhere classified  Rationale for Evaluation and Treatment: Rehabilitation  SUBJECTIVE:   SUBJECTIVE STATEMENT:  Patient notes no difficulty with prosthetic wear and ambulation with RW. Patient endorses ability to ambulate within store for 40 minutes without being accompanied by spouse.  Pt accompanied by: significant other  PERTINENT HISTORY:  Right TTA 12/20/23, L TTA 2019, DM2, CKD3, PVD, arthritis, peripheral neuropathy, CVA,   Patient is a 78 y.o. male who arrives to PT session with new Rt BKA (12/20/2023) and prostheses (first RLE & new LLE) delivery date 03/15/2024. Patient has a history of previous Lt BKA in 2019. Patient reports being able to stand at sink with UE support. Patient has new prosthesis with vacuum pin suction on both LEs which is new system that he is not familiar with. Patient has progressed wear of Rt prosthesis from 1 hour 2x/day up to 3 hrs, 2x/ day with minimal discomfort.  PAIN:  Are you having pain? No  PRECAUTIONS: None  WEIGHT BEARING RESTRICTIONS: No  FALLS: Has patient fallen in last 6 months? No  LIVING ENVIRONMENT: Lives with: lives with their spouse Lives in: House Home Access: Ramped entrance or by garage which is one step Home layout: Two level, 1/2 bath on main level (not accessible with w/c), and Bed/bath upstairs Stairs: Yes:  Internal: 16 steps; on left going up Has following equipment at home: Single point cane, Environmental consultant - 2 wheeled, Wheelchair (manual), shower chair, and Tour manager  OCCUPATION: retired, works as Art therapist on occasion  PLOF: modified independence  PATIENT GOALS: function with both prostheses at community level, doing yard Corporate treasurer   MD appt: follow up 04/30/2024   OBJECTIVE:  Patient-Specific Activity Scoring Scheme  0 represents "unable to perform." 10 represents "able to perform at prior level. 0 1 2 3 4 5 6 7 8 9  10 (Date and Score)  Activity Eval  03/20/24    1.  walking 0     2. standing ADLs  0    3. drive 0   4.stairs  0   5. Yardwork/handyman work 0   Score 0    Total score = sum of the activity scores/number of activities Minimum detectable change (90%CI)  for average score = 2 points Minimum detectable change (90%CI) for single activity score = 3 points  COGNITION: Overall cognitive status: Within functional limits for tasks assessed   SENSATION: WFL  POSTURE: rounded shoulders, forward head, flexed trunk , and weight shift left  LOWER EXTREMITY ROM:  ROM P:passive  A:active Right eval Left eval  Hip flexion    Hip extension    Hip abduction    Hip adduction    Hip internal rotation    Hip external rotation    Knee flexion    Knee extension Seated  P: -5* Seated  P: 0*  Ankle dorsiflexion    Ankle plantarflexion    Ankle inversion    Ankle eversion     (Blank rows = not tested)  LOWER EXTREMITY MMT:  MMT Right eval Left eval  Hip flexion    Hip extension 3-/5 4-/5  Hip abduction 3/5 4-/5  Hip adduction    Hip internal rotation    Hip external rotation    Knee flexion 3/5   Knee extension 3/5   At Evaluation all strength testing is grossly seated and functionally standing / gait. (Blank rows = not tested)  TRANSFERS: Sit to stand: Mod A, dependent on heavy BUE support 18 chair with armrests, use RW to stabilize to  stand upright Stand to sit: Min A with BUE support on armrests to 18 chair, requires RW for stability Lateral scoot transfers: SBA with BUE support and 2 chairs touching same height  FUNCTIONAL TESTs:  Pt requires minA with heavy BUE support on RW for static standing balance.   GAIT: Gait pattern: step to pattern, decreased step length- Right, decreased step length- Left, decreased stance time- Right, decreased hip/knee flexion- Right, Right hip hike, knee flexed in stance- Right, lateral hip instability, trunk flexed, and wide BOS Distance walked: 22' Assistive device utilized: Environmental consultant - 2 wheeled Level of assistance: Mod A   CURRENT PROSTHETIC WEAR ASSESSMENT: 03/20/2024  Patient is dependent with: skin check, residual limb care, prosthetic cleaning, ply sock cleaning, correct ply sock adjustment, proper wear schedule/adjustment, and proper weight-bearing schedule/adjustment Donning prosthesis: Mod A Doffing prosthesis: Mod A Prosthetic wear tolerance: progressed up to 3 hours, 2x/day for last day,  he has worn right prosthesis daily since delivery 6 days ago.  He is wearing Left prosthesis all awake hours.  Prosthetic weight bearing tolerance: pt tolerated standing & gait with RW support for 5 min total with no c/o limb pain.  Edema: RLE pitting with 5-second capillary refill.  None noted in LLE. Residual limb condition:   Right: bone length 7, limb length 8, scar healed, no hair growth, normal skin color and temperature, 1 medial wound 2mm which is clean with no signs of infection, cylindrical shape  Left: conical shape, shrunk with bony landmarks prominent, dark & callused pressure areas at fibula head, tibial tuberosity and lateral tibial plateau.  Prosthetic description: total contact socket with backing pin suction, dynamic response feet  TODAY'S TREATMENT:  DATE:  04/18/2024 Prosthetic Care with BKA prosthesis: PT discussed with patient and spouse on rationale of using bilat SPC prior to unilat SPC. Patient and spouse acknowledged and agrees with process for optimum safety and return to independence.  Patient ambulated 73'3 with bilat SPC prior to physical performance assessment with CGA. PT required patient to verbally recall appropriate 2-point technique with bilat SPC; patient able to recall and correctly verbalize appropriate form. Patient began with 4-point technique for ~5' before transitioning to 2-point technique. PT discussed overall improvement with appropriate form with patient and spouse and discussed importance of continuing to practice in order to improve confidence and steadiness with bilat SPC (prior to introducing gait with unilat SPC). Patient and spouse acknowledging and agreeing.  PT discussed POC and potential progressions to be made within POC with the potential for recertification in order to optimize maximum functional independence. Patient and spouse acknowledged and agreed.  Physical Performance: Patient performed 6 min walk test with bilat SPC, mostly 2-point technique, and CGA throughout (though could have been decreased to SBA). Patient completed 76' with intermittent stutter step and intermittent stepping on Lt SPC, though able to self correct. Patient able to cognitively dual task throughout entire assessment. Patient completed with a HR of 105bpm and SpO2 of 97-98%.  PT discussed results of assessment and performance throughout with patient and spouse.   TODAY'S TREATMENT:                                                                                                                             DATE:  04/16/2024 Prosthetic Care with BKA prosthesis: Pt amb 300' and 100' x 2 with 2 canes initiation of gait with 4-point pattern progressing to 2-point pattern.  PT demo & verbal cues on rationale for this gait to facilitate  counter trunk rotation / balance. Pt verbalized understanding.  PT demo & verbal cues on driving with RLE TTA prosthesis.  Options of 2-foot driving, RLE moving gas/brake or hand controls.  PT recommended sitting behind wheel of car in park moving feet, then progressing to middle of large empty parking lot working various parking options & backing up.  Pt and wife verbalized understanding.   Stairs: descending and ascending with step-to pattern alternating lead LE - 7 steps with 2 rails, 2 steps with RUE rail/LUE cane and 2 step LUE rail/RUE cane. PT demo, verbal & tactile cues on technique.     TREATMENT:  DATE:  04/11/2024 Physical Performance:  Patient performed gait speed analysis with bilat SPC with quad tip and SBA-CGA beginning with 4-point technique and slowly transitioning to 2-point technique.  Self-selected speed: 0.59 ft/s Fast-as-comfortable: 1.19 ft/s Patient completed BERG assessment with RW and CGA. Patient unable to complete tasks without AD due to stability and deficits in tandem and single leg balance. PT discussed performing semi-tandem/tandem stance as well as reciprocal tapping to a step using RW and slowly transitioning to fingertip hold on RW to unilat fingertip hold on RW to hovering above RW without using it as assist, however, to not increase between those holds until completely confident and steady with the one below; patient and spouse acknowledged and agreed.   Prosthetic Care with BKA prosthesis: PT demonstrating and discussing 4-point vs 2-point gait technique with bilat SPC with quad tips as well as lofstrand crutches and giving patient option for which AD he feels more comfortable using. Patient then ambulated with bilat SPC with quad tip with SBA-CGA for 30'10 and 116'8. Patient began both trials of ambulation with 4-point technique and improved  to 2-point technique with time and practice and showed improvements with stability and confidence. Patient performed with slight downward gaze and will benefit from performing with forward gaze as well as dual tasking before increasing challenge to unilat [facility] with quad tip.  PT, patient, and patient's spouse discussed fear of falling. PT challenged patient's fwd stepping reaction x3 while patient held onto RW in order to show stability with RW. PT attempted to challenge with bilat SPC with quad tip, though patient too fearful to complete.     TREATMENT:                                                                                                                             DATE:  04/09/2024 Neuro Re-Ed:  Patient performing tossing and catching with ball with CGA>min A from PT. Min A required intermittently due to posterior LOB. Patient intermittently changing foot placement between typical stance, wide stance, semi-tandem, and narrow stance with no difference in balance with each. Patient performed 40 total tosses with intermittent UE use on RW.  Physical Performance:  PT began assessing BERG on patient using SUP>CGA throughout. Patient intermittently using RW for certain tasks (sit<>stand, transfers) though able to mostly perform balance/reaching activities with no support on RW. Patient requiring intermittent seated rest breaks and using UEs on RW between standing tasks. PT discussed remaining objects of assessment, that were not completed due to time constraints, with patient and whether they will be assessed with RW, without RW, or both. Patient acknowledged and agreed to continue assessment at next session.  Patient, spouse, and PT discussed addition of certain balance tasks to be completed at home and the safest options for performing at home in order to decrease risk of falls.   Prosthetic Care with BKA prosthesis  Patient performing several sit<>stands throughout session with UE use on chairs  and standing to RW. Patient performing with SUP throughout and no required physical assist or verbal cues for appropriate form.  PT discussing potential progression with ambulation by using lofstrand crutches, bilat canes, unilat cane, and eventually no AD. PT discussing importance of balance with ambulation and potential need for bilat AD before progressing to unilat SPC/quad cane. Patient and spouse acknowledge, though patient seems hesitant to use bilat devices.    PATIENT EDUCATION: PATIENT EDUCATED ON FOLLOWING PROSTHETIC CARE: Education details: Alterations required long pants height modification for safety as prosthetist decreased his overall height by approximately 2 inches, shrinker use when prosthesis is off,  Skin check, Residual limb care, Prosthetic cleaning, Correct ply sock adjustment, Propper donning, Proper doffing, Proper wear schedule/adjustment, and Proper weight-bearing schedule/adjustment Prosthetic wear tolerance: 3 hours 2x/day, 7 days/week.  If after 5 days no unusual skin changes or pressure intolerance noted then can increase to 4 hours 2x/day Person educated: Patient and Spouse Education method: Explanation, Demonstration, Tactile cues, and Verbal cues Education comprehension: verbalized understanding, verbal cues required, tactile cues required, and needs further education  HOME EXERCISE PROGRAM:  ASSESSMENT:  CLINICAL IMPRESSION:  Patient arrived to session with no complaints of pain, but minimal soreness following previous session. Patient has made large improvements with appropriate form with bilat SPC. Patient will continue to benefit from skilled PT.  OBJECTIVE IMPAIRMENTS: Abnormal gait, decreased activity tolerance, decreased balance, decreased coordination, decreased endurance, decreased knowledge of use of DME, decreased mobility, difficulty walking, decreased ROM, decreased strength, impaired flexibility, improper body mechanics, postural dysfunction, and  prosthetic dependency .   ACTIVITY LIMITATIONS: carrying, lifting, bending, sitting, standing, squatting, stairs, transfers, bathing, toileting, dressing, reach over head, hygiene/grooming, and locomotion level  PARTICIPATION LIMITATIONS: meal prep, cleaning, driving, shopping, community activity, occupation, and yard work  PERSONAL FACTORS: Age, Fitness, Past/current experiences, Time since onset of injury/illness/exacerbation, and 3+ comorbidities: see PMH are also affecting patient's functional outcome.   REHAB POTENTIAL: Good  CLINICAL DECISION MAKING: Evolving/moderate complexity  EVALUATION COMPLEXITY: Moderate   GOALS: Goals reviewed with patient? Yes  SHORT TERM GOALS: Target date: 04/19/2024  Patient donnes prosthesis modified independent & verbalizes proper cleaning. Baseline: SEE OBJECTIVE DATA Goal status: MET   04/16/2024 2.  Patient tolerates right prosthesis >10 hrs total /day without skin issues or limb pain. Baseline: SEE OBJECTIVE DATA Goal status:  MET 04/16/2024  3.  Patient able to reach 7 and look over both shoulders with RW support with supervision. Baseline: SEE OBJECTIVE DATA Goal status: ongoing   04/16/2024  4. Patient ambulates 22' with RW & prosthesis with CGA. Baseline: SEE OBJECTIVE DATA Goal status: MET   04/16/2024  5. Patient transfers sit to/from stand 18 chair with armrest to RW with CGA.  Baseline: SEE OBJECTIVE DATA Goal status: MET   04/16/2024  LONG TERM GOALS: Target date: 06/18/2024  Patient demonstrates & verbalized understanding of prosthetic care to enable safe utilization of prostheses. Baseline: SEE OBJECTIVE DATA Goal status: ongoing   04/16/2024  Patient tolerates prostheses wear >90% of awake hours without skin or limb pain issues. Baseline: SEE OBJECTIVE DATA Goal status: ongoing   04/16/2024  Patient ambulates >500' with LRAD and prostheses independently Baseline: SEE OBJECTIVE DATA Goal status: ongoing    04/16/2024  Patient negotiates ramps, curbs & stairs with single rail with prostheses with LRAD independently. Baseline: SEE OBJECTIVE DATA Goal status: ongoing   04/16/2024  Patient demonstrates & verbalizes lifting, carrying, pushing, pulling with prostheses only safely. Baseline: SEE OBJECTIVE DATA Goal status:  ongoing   04/16/2024  6.  Patient reports Patient-Specific Activity Score improved the average >/= 6 to indicate improvement in functional activities.   Baseline: SEE OBJECTIVE DATA Goal status: ongoing   04/16/2024  PLAN:  PT FREQUENCY: 2x/week  PT DURATION: 12 weeks  PLANNED INTERVENTIONS: 97164- PT Re-evaluation, 97750- Physical Performance Testing, 97110-Therapeutic exercises, 97530- Therapeutic activity, [id number]- Neuromuscular re-education, 97535- Self Care, [id number]- Manual therapy, [phone]- Gait training, [phone]- Prosthetic Initial , [phone]- Orthotic/Prosthetic subsequent, Patient/Family education, Balance training, Stair training, Scar mobilization, and DME instructions  PLAN FOR NEXT SESSION:   check remaining STG,  work on standing balance with UE resistance bands.    Susannah Daring, PT, DPT 04/18/24 4:50 PM      Referring diagnosis? Z89.511 (ICD-10-CM) - Right below-knee amputee (has history left BKA so now Bil. BKAs) Treatment diagnosis? (if different than referring diagnosis) R26.89, M62.81, M25.661, R26.81 What was this (referring dx) caused by? [x]  Surgery []  Fall []  Ongoing issue []  Arthritis []  Other: ____________  Laterality: []  Rt []  Lt [x]  Both  Check all possible CPT codes:  *CHOOSE 10 OR LESS*    See Planned Interventions listed in the Plan section of the Evaluation.

## 2024-04-18 ENCOUNTER — Ambulatory Visit

## 2024-04-18 DIAGNOSIS — M25661 Stiffness of right knee, not elsewhere classified: Secondary | ICD-10-CM

## 2024-04-18 DIAGNOSIS — M6281 Muscle weakness (generalized): Secondary | ICD-10-CM | POA: Diagnosis not present

## 2024-04-18 DIAGNOSIS — R2681 Unsteadiness on feet: Secondary | ICD-10-CM | POA: Diagnosis not present

## 2024-04-18 DIAGNOSIS — R2689 Other abnormalities of gait and mobility: Secondary | ICD-10-CM | POA: Diagnosis not present

## 2024-04-23 ENCOUNTER — Encounter: Payer: Self-pay | Admitting: Physical Therapy

## 2024-04-23 ENCOUNTER — Ambulatory Visit (INDEPENDENT_AMBULATORY_CARE_PROVIDER_SITE_OTHER): Admitting: Physical Therapy

## 2024-04-23 DIAGNOSIS — R2681 Unsteadiness on feet: Secondary | ICD-10-CM | POA: Diagnosis not present

## 2024-04-23 DIAGNOSIS — M25661 Stiffness of right knee, not elsewhere classified: Secondary | ICD-10-CM | POA: Diagnosis not present

## 2024-04-23 DIAGNOSIS — R2689 Other abnormalities of gait and mobility: Secondary | ICD-10-CM

## 2024-04-23 DIAGNOSIS — M6281 Muscle weakness (generalized): Secondary | ICD-10-CM | POA: Diagnosis not present

## 2024-04-23 NOTE — Therapy (Signed)
 OUTPATIENT PHYSICAL THERAPY PROSTHETIC TREATMENT   Patient Name: Kevin Bradford MRN: [id number] DOB:[birth year], 78 y.o., male Today's Date: 04/23/2024  END OF SESSION:  PT End of Session - 04/23/24 1437     Visit Number 9    Number of Visits 24    Date for Recertification  06/18/24    Authorization Type [employer]    Authorization Time Period Authorization #[id number]  Tracking #[id number]  0903/2025-06/18/2024  10 PT Visits    Authorization - Visit Number 9    Authorization - Number of Visits 10    Progress Note Due on Visit 10    PT Start Time 1435    PT Stop Time 1513    PT Time Calculation (min) 38 min    Equipment Utilized During Treatment Gait belt    Activity Tolerance Patient tolerated treatment well    Behavior During Therapy WFL for tasks assessed/performed               Past Medical History:  Diagnosis Date   Arthritis    joint pain (08/16/2017)   Below knee amputation (HCC)    Cellulitis and abscess of leg 10/18/2014   CKD (chronic kidney disease) stage 3, GFR 30-59 ml/min (HCC)    creatinine increases with antibiotics per pt, no known kidney disease per patient   Dehiscence of closure of skin, subsequent encounter    Diabetic foot ulcer (HCC)    left   Diabetic peripheral neuropathy (HCC)    GERD (gastroesophageal reflux disease)    12/26/2017- not this year.    Osteomyelitis (HCC)    left transmetatarsal    Stroke (HCC)    Type II diabetes mellitus [facility])    Past Surgical History:  Procedure Laterality Date   AMPUTATION Left 07/19/2017   Procedure: LEFT TRANSMETATARSAL AMPUTATION;  Surgeon: Harden Jerona GAILS, MD;  Location: [facility] OR;  Service: Orthopedics;  Laterality: Left;   AMPUTATION Left 08/16/2017   REVISION LEFT TRANSMETATARSAL AMPUTATION   AMPUTATION Left 11/22/2017   Procedure: LEFT BELOW KNEE AMPUTATION;  Surgeon: Harden Jerona GAILS, MD;  Location: [facility] OR;  Service: Orthopedics;  Laterality: Left;   AMPUTATION Right 12/20/2023   Procedure:  AMPUTATION RIGHT BELOW KNEE;  Surgeon: Harden Jerona GAILS, MD;  Location: [facility] OR;  Service: Orthopedics;  Laterality: Right;   CATARACT EXTRACTION W/ INTRAOCULAR LENS  IMPLANT, BILATERAL  ~ 2016   EYE SURGERY Bilateral    catarct   I & D EXTREMITY Left 10/17/2014   Procedure: IRRIGATION AND DEBRIDEMENT EXTREMITY LEFT FOOT;  Surgeon: Jerona Harden GAILS, MD;  Location: [facility] OR;  Service: Orthopedics;  Laterality: Left;   IR ANGIO INTRA EXTRACRAN SEL COM CAROTID INNOMINATE BILAT MOD SED  05/07/2018   IR ANGIO VERTEBRAL SEL SUBCLAVIAN INNOMINATE BILAT MOD SED  05/07/2018   STUMP REVISION Left 08/16/2017   Procedure: REVISION LEFT TRANSMETATARSAL AMPUTATION;  Surgeon: Harden Jerona GAILS, MD;  Location: [facility] OR;  Service: Orthopedics;  Laterality: Left;   STUMP REVISION Left 12/27/2017   Procedure: LEFT BELOW KNEE AMPUTATION REVISION;  Surgeon: Harden Jerona GAILS, MD;  Location: [facility] OR;  Service: Orthopedics;  Laterality: Left;   TOE AMPUTATION Right 2013   5th toe   Patient Active Problem List   Diagnosis Date Noted   Coping style affecting medical condition 12/29/2023   Malnutrition of moderate degree 12/25/2023   Right below-knee amputee (HCC) 12/25/2023   Diabetic infection of right foot (HCC) 12/16/2023   History of stroke 12/16/2023   Acute osteomyelitis of  right calcaneus (HCC) 12/16/2023   Cutaneous abscess of right foot 12/16/2023   DKA, type 2 (HCC) 12/08/2023   Hypokalemia 12/08/2023   S/P BKA (below knee amputation), left (HCC) 12/08/2023   Hyperosmolar hyperglycemic state (HHS) (HCC) 12/07/2023   Stroke (HCC)    Symptomatic bradycardia 05/06/2018   Postural dizziness with near syncope 05/06/2018   Labile blood glucose    Acute blood loss anemia    Labile blood pressure    CKD (chronic kidney disease) stage 2, GFR 60-89 ml/min    Type 2 diabetes mellitus with peripheral neuropathy (HCC)    Post-operative pain    Amputation of left lower extremity below knee upon examination (HCC) 11/24/2017    Amputated below knee, left (HCC) 11/22/2017   Osteomyelitis of left foot (HCC)    Dehiscence of amputation stump (HCC) 08/11/2017   S/P transmetatarsal amputation of foot, left (HCC) 07/19/2017   Subacute osteomyelitis, left ankle and foot (HCC) 07/17/2017   Diabetic ulcer of left foot associated with type 2 diabetes mellitus (HCC)    Diabetes mellitus with renal complications (HCC) 10/17/2014   Diabetes (HCC) 10/13/2014   Normocytic anemia 10/13/2014   Hyperkalemia 10/13/2014    PCP: Ransom Other, MD  REFERRING PROVIDER: Harden Jerona GAILS, MD  ONSET DATE: prosthesis delivery: 03/15/2024  REFERRING DIAG: S10.488 (ICD-10-CM) - Right below-knee amputee (HCC)   THERAPY DIAG:  Other abnormalities of gait and mobility  Muscle weakness (generalized)  Unsteadiness on feet  Stiffness of right knee, not elsewhere classified  Rationale for Evaluation and Treatment: Rehabilitation  SUBJECTIVE:   SUBJECTIVE STATEMENT:  He used walker to go restaurant & change some filters. He is wearing prostheses most of awake hours except 1-2 hours midday.   Pt accompanied by: significant other  PERTINENT HISTORY:  Right TTA 12/20/23, L TTA 2019, DM2, CKD3, PVD, arthritis, peripheral neuropathy, CVA,   Patient is a 78 y.o. male who arrives to PT session with new Rt BKA (12/20/2023) and prostheses (first RLE & new LLE) delivery date 03/15/2024. Patient has a history of previous Lt BKA in 2019. Patient reports being able to stand at sink with UE support. Patient has new prosthesis with vacuum pin suction on both LEs which is new system that he is not familiar with. Patient has progressed wear of Rt prosthesis from 1 hour 2x/day up to 3 hrs, 2x/ day with minimal discomfort.   PAIN:  Are you having pain? No  PRECAUTIONS: None  WEIGHT BEARING RESTRICTIONS: No  FALLS: Has patient fallen in last 6 months? No  LIVING ENVIRONMENT: Lives with: lives with their spouse Lives in: House Home Access: Ramped  entrance or by garage which is one step Home layout: Two level, 1/2 bath on main level (not accessible with w/c), and Bed/bath upstairs Stairs: Yes: Internal: 16 steps; on left going up Has following equipment at home: Single point cane, Environmental consultant - 2 wheeled, Wheelchair (manual), shower chair, and Tour manager  OCCUPATION: retired, works as Art therapist on occasion  PLOF: modified independence  PATIENT GOALS: function with both prostheses at community level, doing yard Corporate treasurer   MD appt: follow up 04/30/2024   OBJECTIVE:  Patient-Specific Activity Scoring Scheme  0 represents "unable to perform." 10 represents "able to perform at prior level. 0 1 2 3 4 5 6 7 8 9  10 (Date and Score)  Activity Eval  03/20/24    1.  walking 0     2. standing ADLs  0    3. drive  0   4.stairs  0   5. Yardwork/handyman work 0   Score 0    Total score = sum of the activity scores/number of activities Minimum detectable change (90%CI) for average score = 2 points Minimum detectable change (90%CI) for single activity score = 3 points  COGNITION: Overall cognitive status: Within functional limits for tasks assessed   SENSATION: WFL  POSTURE: rounded shoulders, forward head, flexed trunk , and weight shift left  LOWER EXTREMITY ROM:  ROM P:passive  A:active Right eval Left eval  Hip flexion    Hip extension    Hip abduction    Hip adduction    Hip internal rotation    Hip external rotation    Knee flexion    Knee extension Seated  P: -5* Seated  P: 0*  Ankle dorsiflexion    Ankle plantarflexion    Ankle inversion    Ankle eversion     (Blank rows = not tested)  LOWER EXTREMITY MMT:  MMT Right eval Left eval  Hip flexion    Hip extension 3-/5 4-/5  Hip abduction 3/5 4-/5  Hip adduction    Hip internal rotation    Hip external rotation    Knee flexion 3/5   Knee extension 3/5   At Evaluation all strength testing is grossly seated and functionally standing  / gait. (Blank rows = not tested)  TRANSFERS: Sit to stand: Mod A, dependent on heavy BUE support 18 chair with armrests, use RW to stabilize to stand upright Stand to sit: Min A with BUE support on armrests to 18 chair, requires RW for stability Lateral scoot transfers: SBA with BUE support and 2 chairs touching same height  FUNCTIONAL TESTs:  Pt requires minA with heavy BUE support on RW for static standing balance.   GAIT: Gait pattern: step to pattern, decreased step length- Right, decreased step length- Left, decreased stance time- Right, decreased hip/knee flexion- Right, Right hip hike, knee flexed in stance- Right, lateral hip instability, trunk flexed, and wide BOS Distance walked: 22' Assistive device utilized: Environmental consultant - 2 wheeled Level of assistance: Mod A   CURRENT PROSTHETIC WEAR ASSESSMENT: 03/20/2024  Patient is dependent with: skin check, residual limb care, prosthetic cleaning, ply sock cleaning, correct ply sock adjustment, proper wear schedule/adjustment, and proper weight-bearing schedule/adjustment Donning prosthesis: Mod A Doffing prosthesis: Mod A Prosthetic wear tolerance: progressed up to 3 hours, 2x/day for last day,  he has worn right prosthesis daily since delivery 6 days ago.  He is wearing Left prosthesis all awake hours.  Prosthetic weight bearing tolerance: pt tolerated standing & gait with RW support for 5 min total with no c/o limb pain.  Edema: RLE pitting with 5-second capillary refill.  None noted in LLE. Residual limb condition:   Right: bone length 7, limb length 8, scar healed, no hair growth, normal skin color and temperature, 1 medial wound 2mm which is clean with no signs of infection, cylindrical shape  Left: conical shape, shrunk with bony landmarks prominent, dark & callused pressure areas at fibula head, tibial tuberosity and lateral tibial plateau.  Prosthetic description: total contact socket with backing pin suction, dynamic response  feet  TODAY'S TREATMENT:  DATE:  04/23/2024 Prosthetic Care with bilateral BKA prostheses: Pt amb 120' with 2 SBQCs with CGA with PT cues on upright posture. Pt amb 61' with 1 SBQC in RUE then 72' in LUE with CGA.  Progressed to carrying small ball on plate in UE without SBQC. Pt able to perform with CGA. Stairs: Ascending and descending with single rail and cane and contralateral upper extremity step to pattern alternating lead LE with contact-guard assist 4 steps with rail on each side; alternating pattern with 2 rails for 4 steps 2 reps with minA.    Sit to stand from 18 chair without armrests using BUEs on seat with minA/CGA.  From 18 chair using 1 armrest and contralateral upper extremity on chair seat with supervision 2 reps with each upper extremity.  Worked on not touching RW for stabilization upon arising. Looking over shoulders with single upper extremity hovering over walker with intermittent touch needed   TREATMENT:                                                                                                                             DATE:  04/18/2024 Prosthetic Care with bilateral BKA prostheses: PT discussed with patient and spouse on rationale of using bilat SPC prior to unilat SPC. Patient and spouse acknowledged and agrees with process for optimum safety and return to independence.  Patient ambulated 73'3 with bilat SPC prior to physical performance assessment with CGA. PT required patient to verbally recall appropriate 2-point technique with bilat SPC; patient able to recall and correctly verbalize appropriate form. Patient began with 4-point technique for ~5' before transitioning to 2-point technique. PT discussed overall improvement with appropriate form with patient and spouse and discussed importance of continuing to practice in order to improve  confidence and steadiness with bilat SPC (prior to introducing gait with unilat SPC). Patient and spouse acknowledging and agreeing.  PT discussed POC and potential progressions to be made within POC with the potential for recertification in order to optimize maximum functional independence. Patient and spouse acknowledged and agreed.  Physical Performance: Patient performed 6 min walk test with bilat SPC, mostly 2-point technique, and CGA throughout (though could have been decreased to SBA). Patient completed 23' with intermittent stutter step and intermittent stepping on Lt SPC, though able to self correct. Patient able to cognitively dual task throughout entire assessment. Patient completed with a HR of 105bpm and SpO2 of 97-98%.  PT discussed results of assessment and performance throughout with patient and spouse.   TREATMENT:  DATE:  04/16/2024 Prosthetic Care with BKA prosthesis: Pt amb 300' and 100' x 2 with 2 canes initiation of gait with 4-point pattern progressing to 2-point pattern.  PT demo & verbal cues on rationale for this gait to facilitate counter trunk rotation / balance. Pt verbalized understanding.  PT demo & verbal cues on driving with RLE TTA prosthesis.  Options of 2-foot driving, RLE moving gas/brake or hand controls.  PT recommended sitting behind wheel of car in park moving feet, then progressing to middle of large empty parking lot working various parking options & backing up.  Pt and wife verbalized understanding.   Stairs: descending and ascending with step-to pattern alternating lead LE - 7 steps with 2 rails, 2 steps with RUE rail/LUE cane and 2 step LUE rail/RUE cane. PT demo, verbal & tactile cues on technique.     TREATMENT:                                                                                                                              DATE:  04/11/2024 Physical Performance:  Patient performed gait speed analysis with bilat SPC with quad tip and SBA-CGA beginning with 4-point technique and slowly transitioning to 2-point technique.  Self-selected speed: 0.59 ft/s Fast-as-comfortable: 1.19 ft/s Patient completed BERG assessment with RW and CGA. Patient unable to complete tasks without AD due to stability and deficits in tandem and single leg balance. PT discussed performing semi-tandem/tandem stance as well as reciprocal tapping to a step using RW and slowly transitioning to fingertip hold on RW to unilat fingertip hold on RW to hovering above RW without using it as assist, however, to not increase between those holds until completely confident and steady with the one below; patient and spouse acknowledged and agreed.   Prosthetic Care with BKA prostheses: PT demonstrating and discussing 4-point vs 2-point gait technique with bilat SPC with quad tips as well as lofstrand crutches and giving patient option for which AD he feels more comfortable using. Patient then ambulated with bilat SPC with quad tip with SBA-CGA for 30'10 and 116'8. Patient began both trials of ambulation with 4-point technique and improved to 2-point technique with time and practice and showed improvements with stability and confidence. Patient performed with slight downward gaze and will benefit from performing with forward gaze as well as dual tasking before increasing challenge to unilat [facility] with quad tip.  PT, patient, and patient's spouse discussed fear of falling. PT challenged patient's fwd stepping reaction x3 while patient held onto RW in order to show stability with RW. PT attempted to challenge with bilat SPC with quad tip, though patient too fearful to complete.     PATIENT EDUCATION: PATIENT EDUCATED ON FOLLOWING PROSTHETIC CARE: Education details: Alterations required long pants height modification for safety as prosthetist decreased his overall  height by approximately 2 inches, shrinker use when prosthesis is off,  Skin check, Residual limb care, Prosthetic cleaning, Correct ply sock adjustment,  Propper donning, Proper doffing, Proper wear schedule/adjustment, and Proper weight-bearing schedule/adjustment Prosthetic wear tolerance: 3 hours 2x/day, 7 days/week.  If after 5 days no unusual skin changes or pressure intolerance noted then can increase to 4 hours 2x/day Person educated: Patient and Spouse Education method: Explanation, Demonstration, Tactile cues, and Verbal cues Education comprehension: verbalized understanding, verbal cues required, tactile cues required, and needs further education  HOME EXERCISE PROGRAM:  ASSESSMENT:  CLINICAL IMPRESSION:  Patient has significantly improved with function and safety with bilateral transtibial prostheses.  He has progressed from significant bilateral upper extremity support of rolling walker cane.   patient will continue to benefit from skilled PT.  OBJECTIVE IMPAIRMENTS: Abnormal gait, decreased activity tolerance, decreased balance, decreased coordination, decreased endurance, decreased knowledge of use of DME, decreased mobility, difficulty walking, decreased ROM, decreased strength, impaired flexibility, improper body mechanics, postural dysfunction, and prosthetic dependency .   ACTIVITY LIMITATIONS: carrying, lifting, bending, sitting, standing, squatting, stairs, transfers, bathing, toileting, dressing, reach over head, hygiene/grooming, and locomotion level  PARTICIPATION LIMITATIONS: meal prep, cleaning, driving, shopping, community activity, occupation, and yard work  PERSONAL FACTORS: Age, Fitness, Past/current experiences, Time since onset of injury/illness/exacerbation, and 3+ comorbidities: see PMH are also affecting patient's functional outcome.   REHAB POTENTIAL: Good  CLINICAL DECISION MAKING: Evolving/moderate complexity  EVALUATION COMPLEXITY:  Moderate   GOALS: Goals reviewed with patient? Yes  SHORT TERM GOALS: Target date: 04/19/2024  Patient donnes prosthesis modified independent & verbalizes proper cleaning. Baseline: SEE OBJECTIVE DATA Goal status: MET   04/16/2024 2.  Patient tolerates right prosthesis >10 hrs total /day without skin issues or limb pain. Baseline: SEE OBJECTIVE DATA Goal status:  MET 04/16/2024  3.  Patient able to reach 7 and look over both shoulders with RW support with supervision. Baseline: SEE OBJECTIVE DATA Goal status: Met 04/16/2024  4. Patient ambulates 9' with RW & prosthesis with CGA. Baseline: SEE OBJECTIVE DATA Goal status: MET   04/16/2024  5. Patient transfers sit to/from stand 18 chair with armrest to RW with CGA.  Baseline: SEE OBJECTIVE DATA Goal status: MET   04/16/2024  LONG TERM GOALS: Target date: 06/18/2024  Patient demonstrates & verbalized understanding of prosthetic care to enable safe utilization of prostheses. Baseline: SEE OBJECTIVE DATA Goal status: ongoing   04/16/2024  Patient tolerates prostheses wear >90% of awake hours without skin or limb pain issues. Baseline: SEE OBJECTIVE DATA Goal status: ongoing   04/16/2024  Patient ambulates >500' with LRAD and prostheses independently Baseline: SEE OBJECTIVE DATA Goal status: ongoing   04/16/2024  Patient negotiates ramps, curbs & stairs with single rail with prostheses with LRAD independently. Baseline: SEE OBJECTIVE DATA Goal status: ongoing   04/16/2024  Patient demonstrates & verbalizes lifting, carrying, pushing, pulling with prostheses only safely. Baseline: SEE OBJECTIVE DATA Goal status: ongoing   04/16/2024  6.  Patient reports Patient-Specific Activity Score improved the average >/= 6 to indicate improvement in functional activities.   Baseline: SEE OBJECTIVE DATA Goal status: ongoing   04/16/2024  PLAN:  PT FREQUENCY: 2x/week  PT DURATION: 12 weeks  PLANNED INTERVENTIONS: 97164- PT Re-evaluation,  97750- Physical Performance Testing, 97110-Therapeutic exercises, 97530- Therapeutic activity, 97112- Neuromuscular re-education, 97535- Self Care, [id number]- Manual therapy, [phone]- Gait training, [phone]- Prosthetic Initial , [phone]- Orthotic/Prosthetic subsequent, Patient/Family education, Balance training, Stair training, Scar mobilization, and DME instructions  PLAN FOR NEXT SESSION:    Do 10th visit progress note & [employer] reauthorization, do updated PSFS, perform Berg balance,  work on standing balance with  UE resistance bands.     Grayce Spatz, PT, DPT 04/23/2024, 5:35 PM    Referring diagnosis? Z89.511 (ICD-10-CM) - Right below-knee amputee (has history left BKA so now Bil. BKAs) Treatment diagnosis? (if different than referring diagnosis) R26.89, M62.81, M25.661, R26.81 What was this (referring dx) caused by? [x]  Surgery []  Fall []  Ongoing issue []  Arthritis []  Other: ____________  Laterality: []  Rt []  Lt [x]  Both  Check all possible CPT codes:  *CHOOSE 10 OR LESS*    See Planned Interventions listed in the Plan section of the Evaluation.

## 2024-04-24 NOTE — Therapy (Signed)
 OUTPATIENT PHYSICAL THERAPY PROSTHETIC TREATMENT / RECERT   Patient Name: Kevin Bradford MRN: [id number] DOB:[birth year], 78 y.o., male Today's Date: 04/24/2024  END OF SESSION:         Past Medical History:  Diagnosis Date   Arthritis    joint pain (08/16/2017)   Below knee amputation (HCC)    Cellulitis and abscess of leg 10/18/2014   CKD (chronic kidney disease) stage 3, GFR 30-59 ml/min (HCC)    creatinine increases with antibiotics per pt, no known kidney disease per patient   Dehiscence of closure of skin, subsequent encounter    Diabetic foot ulcer (HCC)    left   Diabetic peripheral neuropathy (HCC)    GERD (gastroesophageal reflux disease)    12/26/2017- not this year.    Osteomyelitis (HCC)    left transmetatarsal    Stroke (HCC)    Type II diabetes mellitus [facility])    Past Surgical History:  Procedure Laterality Date   AMPUTATION Left 07/19/2017   Procedure: LEFT TRANSMETATARSAL AMPUTATION;  Surgeon: Harden Jerona GAILS, MD;  Location: [facility] OR;  Service: Orthopedics;  Laterality: Left;   AMPUTATION Left 08/16/2017   REVISION LEFT TRANSMETATARSAL AMPUTATION   AMPUTATION Left 11/22/2017   Procedure: LEFT BELOW KNEE AMPUTATION;  Surgeon: Harden Jerona GAILS, MD;  Location: [facility] OR;  Service: Orthopedics;  Laterality: Left;   AMPUTATION Right 12/20/2023   Procedure: AMPUTATION RIGHT BELOW KNEE;  Surgeon: Harden Jerona GAILS, MD;  Location: [facility] OR;  Service: Orthopedics;  Laterality: Right;   CATARACT EXTRACTION W/ INTRAOCULAR LENS  IMPLANT, BILATERAL  ~ 2016   EYE SURGERY Bilateral    catarct   I & D EXTREMITY Left 10/17/2014   Procedure: IRRIGATION AND DEBRIDEMENT EXTREMITY LEFT FOOT;  Surgeon: Jerona Harden GAILS, MD;  Location: [facility] OR;  Service: Orthopedics;  Laterality: Left;   IR ANGIO INTRA EXTRACRAN SEL COM CAROTID INNOMINATE BILAT MOD SED  05/07/2018   IR ANGIO VERTEBRAL SEL SUBCLAVIAN INNOMINATE BILAT MOD SED  05/07/2018   STUMP REVISION Left 08/16/2017   Procedure: REVISION LEFT  TRANSMETATARSAL AMPUTATION;  Surgeon: Harden Jerona GAILS, MD;  Location: [facility] OR;  Service: Orthopedics;  Laterality: Left;   STUMP REVISION Left 12/27/2017   Procedure: LEFT BELOW KNEE AMPUTATION REVISION;  Surgeon: Harden Jerona GAILS, MD;  Location: [facility] OR;  Service: Orthopedics;  Laterality: Left;   TOE AMPUTATION Right 2013   5th toe   Patient Active Problem List   Diagnosis Date Noted   Coping style affecting medical condition 12/29/2023   Malnutrition of moderate degree 12/25/2023   Right below-knee amputee (HCC) 12/25/2023   Diabetic infection of right foot (HCC) 12/16/2023   History of stroke 12/16/2023   Acute osteomyelitis of right calcaneus (HCC) 12/16/2023   Cutaneous abscess of right foot 12/16/2023   DKA, type 2 (HCC) 12/08/2023   Hypokalemia 12/08/2023   S/P BKA (below knee amputation), left (HCC) 12/08/2023   Hyperosmolar hyperglycemic state (HHS) (HCC) 12/07/2023   Stroke (HCC)    Symptomatic bradycardia 05/06/2018   Postural dizziness with near syncope 05/06/2018   Labile blood glucose    Acute blood loss anemia    Labile blood pressure    CKD (chronic kidney disease) stage 2, GFR 60-89 ml/min    Type 2 diabetes mellitus with peripheral neuropathy (HCC)    Post-operative pain    Amputation of left lower extremity below knee upon examination (HCC) 11/24/2017   Amputated below knee, left (HCC) 11/22/2017   Osteomyelitis of left foot (HCC)  Dehiscence of amputation stump (HCC) 08/11/2017   S/P transmetatarsal amputation of foot, left (HCC) 07/19/2017   Subacute osteomyelitis, left ankle and foot (HCC) 07/17/2017   Diabetic ulcer of left foot associated with type 2 diabetes mellitus (HCC)    Diabetes mellitus with renal complications (HCC) 10/17/2014   Diabetes (HCC) 10/13/2014   Normocytic anemia 10/13/2014   Hyperkalemia 10/13/2014    PCP: Ransom Other, MD  REFERRING PROVIDER: Harden Jerona GAILS, MD  ONSET DATE: prosthesis delivery: 03/15/2024  REFERRING DIAG:  S10.488 (ICD-10-CM) - Right below-knee amputee (HCC)   THERAPY DIAG:  No diagnosis found.  Rationale for Evaluation and Treatment: Rehabilitation  SUBJECTIVE:   SUBJECTIVE STATEMENT:  He used walker to go restaurant & change some filters. He is wearing prostheses most of awake hours except 1-2 hours midday.   Pt accompanied by: significant other  PERTINENT HISTORY:  Right TTA 12/20/23, L TTA 2019, DM2, CKD3, PVD, arthritis, peripheral neuropathy, CVA,   Patient is a 78 y.o. male who arrives to PT session with new Rt BKA (12/20/2023) and prostheses (first RLE & new LLE) delivery date 03/15/2024. Patient has a history of previous Lt BKA in 2019. Patient reports being able to stand at sink with UE support. Patient has new prosthesis with vacuum pin suction on both LEs which is new system that he is not familiar with. Patient has progressed wear of Rt prosthesis from 1 hour 2x/day up to 3 hrs, 2x/ day with minimal discomfort.   PAIN:  Are you having pain? No  PRECAUTIONS: None  WEIGHT BEARING RESTRICTIONS: No  FALLS: Has patient fallen in last 6 months? No  LIVING ENVIRONMENT: Lives with: lives with their spouse Lives in: House Home Access: Ramped entrance or by garage which is one step Home layout: Two level, 1/2 bath on main level (not accessible with w/c), and Bed/bath upstairs Stairs: Yes: Internal: 16 steps; on left going up Has following equipment at home: Single point cane, Environmental consultant - 2 wheeled, Wheelchair (manual), shower chair, and Tour manager  OCCUPATION: retired, works as Art therapist on occasion  PLOF: modified independence  PATIENT GOALS: function with both prostheses at community level, doing yard Corporate treasurer   MD appt: follow up 04/30/2024   OBJECTIVE:  Patient-Specific Activity Scoring Scheme  0 represents "unable to perform." 10 represents "able to perform at prior level. 0 1 2 3 4 5 6 7 8 9  10 (Date and Score)  Activity Eval  03/20/24    1.   walking 0     2. standing ADLs  0    3. drive 0   4.stairs  0   5. Yardwork/handyman work 0   Score 0    Total score = sum of the activity scores/number of activities Minimum detectable change (90%CI) for average score = 2 points Minimum detectable change (90%CI) for single activity score = 3 points  COGNITION: Overall cognitive status: Within functional limits for tasks assessed   SENSATION: WFL  POSTURE: rounded shoulders, forward head, flexed trunk , and weight shift left  LOWER EXTREMITY ROM:  ROM P:passive  A:active Right eval Left eval  Hip flexion    Hip extension    Hip abduction    Hip adduction    Hip internal rotation    Hip external rotation    Knee flexion    Knee extension Seated  P: -5* Seated  P: 0*  Ankle dorsiflexion    Ankle plantarflexion    Ankle inversion    Ankle  eversion     (Blank rows = not tested)  LOWER EXTREMITY MMT:  MMT Right eval Left eval  Hip flexion    Hip extension 3-/5 4-/5  Hip abduction 3/5 4-/5  Hip adduction    Hip internal rotation    Hip external rotation    Knee flexion 3/5   Knee extension 3/5   At Evaluation all strength testing is grossly seated and functionally standing / gait. (Blank rows = not tested)  TRANSFERS: Sit to stand: Mod A, dependent on heavy BUE support 18 chair with armrests, use RW to stabilize to stand upright Stand to sit: Min A with BUE support on armrests to 18 chair, requires RW for stability Lateral scoot transfers: SBA with BUE support and 2 chairs touching same height  FUNCTIONAL TESTs:  Pt requires minA with heavy BUE support on RW for static standing balance.   GAIT: Gait pattern: step to pattern, decreased step length- Right, decreased step length- Left, decreased stance time- Right, decreased hip/knee flexion- Right, Right hip hike, knee flexed in stance- Right, lateral hip instability, trunk flexed, and wide BOS Distance walked: 22' Assistive device utilized: Environmental consultant - 2  wheeled Level of assistance: Mod A   CURRENT PROSTHETIC WEAR ASSESSMENT: 03/20/2024  Patient is dependent with: skin check, residual limb care, prosthetic cleaning, ply sock cleaning, correct ply sock adjustment, proper wear schedule/adjustment, and proper weight-bearing schedule/adjustment Donning prosthesis: Mod A Doffing prosthesis: Mod A Prosthetic wear tolerance: progressed up to 3 hours, 2x/day for last day,  he has worn right prosthesis daily since delivery 6 days ago.  He is wearing Left prosthesis all awake hours.  Prosthetic weight bearing tolerance: pt tolerated standing & gait with RW support for 5 min total with no c/o limb pain.  Edema: RLE pitting with 5-second capillary refill.  None noted in LLE. Residual limb condition:   Right: bone length 7, limb length 8, scar healed, no hair growth, normal skin color and temperature, 1 medial wound 2mm which is clean with no signs of infection, cylindrical shape  Left: conical shape, shrunk with bony landmarks prominent, dark & callused pressure areas at fibula head, tibial tuberosity and lateral tibial plateau.  Prosthetic description: total contact socket with backing pin suction, dynamic response feet  TODAY'S TREATMENT:                                                                                                                             DATE:  04/25/2024 ***   TODAY'S TREATMENT:  DATE:  04/23/2024 Prosthetic Care with bilateral BKA prostheses: Pt amb 120' with 2 SBQCs with CGA with PT cues on upright posture. Pt amb 19' with 1 SBQC in RUE then 43' in LUE with CGA.  Progressed to carrying small ball on plate in UE without SBQC. Pt able to perform with CGA. Stairs: Ascending and descending with single rail and cane and contralateral upper extremity step to pattern alternating lead LE with contact-guard assist  4 steps with rail on each side; alternating pattern with 2 rails for 4 steps 2 reps with minA.    Sit to stand from 18 chair without armrests using BUEs on seat with minA/CGA.  From 18 chair using 1 armrest and contralateral upper extremity on chair seat with supervision 2 reps with each upper extremity.  Worked on not touching RW for stabilization upon arising. Looking over shoulders with single upper extremity hovering over walker with intermittent touch needed   TREATMENT:                                                                                                                             DATE:  04/18/2024 Prosthetic Care with bilateral BKA prostheses: PT discussed with patient and spouse on rationale of using bilat SPC prior to unilat SPC. Patient and spouse acknowledged and agrees with process for optimum safety and return to independence.  Patient ambulated 73'3 with bilat SPC prior to physical performance assessment with CGA. PT required patient to verbally recall appropriate 2-point technique with bilat SPC; patient able to recall and correctly verbalize appropriate form. Patient began with 4-point technique for ~5' before transitioning to 2-point technique. PT discussed overall improvement with appropriate form with patient and spouse and discussed importance of continuing to practice in order to improve confidence and steadiness with bilat SPC (prior to introducing gait with unilat SPC). Patient and spouse acknowledging and agreeing.  PT discussed POC and potential progressions to be made within POC with the potential for recertification in order to optimize maximum functional independence. Patient and spouse acknowledged and agreed.  Physical Performance: Patient performed 6 min walk test with bilat SPC, mostly 2-point technique, and CGA throughout (though could have been decreased to SBA). Patient completed 49' with intermittent stutter step and intermittent stepping on Lt SPC,  though able to self correct. Patient able to cognitively dual task throughout entire assessment. Patient completed with a HR of 105bpm and SpO2 of 97-98%.  PT discussed results of assessment and performance throughout with patient and spouse.   TREATMENT:  DATE:  04/16/2024 Prosthetic Care with BKA prosthesis: Pt amb 300' and 100' x 2 with 2 canes initiation of gait with 4-point pattern progressing to 2-point pattern.  PT demo & verbal cues on rationale for this gait to facilitate counter trunk rotation / balance. Pt verbalized understanding.  PT demo & verbal cues on driving with RLE TTA prosthesis.  Options of 2-foot driving, RLE moving gas/brake or hand controls.  PT recommended sitting behind wheel of car in park moving feet, then progressing to middle of large empty parking lot working various parking options & backing up.  Pt and wife verbalized understanding.   Stairs: descending and ascending with step-to pattern alternating lead LE - 7 steps with 2 rails, 2 steps with RUE rail/LUE cane and 2 step LUE rail/RUE cane. PT demo, verbal & tactile cues on technique.        PATIENT EDUCATION: PATIENT EDUCATED ON FOLLOWING PROSTHETIC CARE: Education details: Alterations required long pants height modification for safety as prosthetist decreased his overall height by approximately 2 inches, shrinker use when prosthesis is off,  Skin check, Residual limb care, Prosthetic cleaning, Correct ply sock adjustment, Propper donning, Proper doffing, Proper wear schedule/adjustment, and Proper weight-bearing schedule/adjustment Prosthetic wear tolerance: 3 hours 2x/day, 7 days/week.  If after 5 days no unusual skin changes or pressure intolerance noted then can increase to 4 hours 2x/day Person educated: Patient and Spouse Education method: Explanation, Demonstration, Tactile cues, and  Verbal cues Education comprehension: verbalized understanding, verbal cues required, tactile cues required, and needs further education  HOME EXERCISE PROGRAM:  ASSESSMENT:  CLINICAL IMPRESSION:  *** Patient has significantly improved with function and safety with bilateral transtibial prostheses.  He has progressed from significant bilateral upper extremity support of rolling walker cane.   patient will continue to benefit from skilled PT.  OBJECTIVE IMPAIRMENTS: Abnormal gait, decreased activity tolerance, decreased balance, decreased coordination, decreased endurance, decreased knowledge of use of DME, decreased mobility, difficulty walking, decreased ROM, decreased strength, impaired flexibility, improper body mechanics, postural dysfunction, and prosthetic dependency .   ACTIVITY LIMITATIONS: carrying, lifting, bending, sitting, standing, squatting, stairs, transfers, bathing, toileting, dressing, reach over head, hygiene/grooming, and locomotion level  PARTICIPATION LIMITATIONS: meal prep, cleaning, driving, shopping, community activity, occupation, and yard work  PERSONAL FACTORS: Age, Fitness, Past/current experiences, Time since onset of injury/illness/exacerbation, and 3+ comorbidities: see PMH are also affecting patient's functional outcome.   REHAB POTENTIAL: Good  CLINICAL DECISION MAKING: Evolving/moderate complexity  EVALUATION COMPLEXITY: Moderate   GOALS: Goals reviewed with patient? Yes  SHORT TERM GOALS: Target date: 04/19/2024  Patient donnes prosthesis modified independent & verbalizes proper cleaning. Baseline: SEE OBJECTIVE DATA Goal status: MET   04/16/2024 2.  Patient tolerates right prosthesis >10 hrs total /day without skin issues or limb pain. Baseline: SEE OBJECTIVE DATA Goal status:  MET 04/16/2024  3.  Patient able to reach 7 and look over both shoulders with RW support with supervision. Baseline: SEE OBJECTIVE DATA Goal status: Met 04/16/2024  4.  Patient ambulates 2' with RW & prosthesis with CGA. Baseline: SEE OBJECTIVE DATA Goal status: MET   04/16/2024  5. Patient transfers sit to/from stand 18 chair with armrest to RW with CGA.  Baseline: SEE OBJECTIVE DATA Goal status: MET   04/16/2024  LONG TERM GOALS: Target date: 06/18/2024  Patient demonstrates & verbalized understanding of prosthetic care to enable safe utilization of prostheses. Baseline: SEE OBJECTIVE DATA Goal status: ongoing   04/16/2024  Patient tolerates prostheses wear >90% of awake hours  without skin or limb pain issues. Baseline: SEE OBJECTIVE DATA Goal status: ongoing   04/16/2024  Patient ambulates >500' with LRAD and prostheses independently Baseline: SEE OBJECTIVE DATA Goal status: ongoing   04/16/2024  Patient negotiates ramps, curbs & stairs with single rail with prostheses with LRAD independently. Baseline: SEE OBJECTIVE DATA Goal status: ongoing   04/16/2024  Patient demonstrates & verbalizes lifting, carrying, pushing, pulling with prostheses only safely. Baseline: SEE OBJECTIVE DATA Goal status: ongoing   04/16/2024  6.  Patient reports Patient-Specific Activity Score improved the average >/= 6 to indicate improvement in functional activities.   Baseline: SEE OBJECTIVE DATA Goal status: ongoing   04/16/2024  PLAN:  PT FREQUENCY: 2x/week  PT DURATION: 12 weeks  PLANNED INTERVENTIONS: 97164- PT Re-evaluation, 97750- Physical Performance Testing, 97110-Therapeutic exercises, 97530- Therapeutic activity, 97112- Neuromuscular re-education, 97535- Self Care, [id number]- Manual therapy, [phone]- Gait training, [phone]- Prosthetic Initial , [phone]- Orthotic/Prosthetic subsequent, Patient/Family education, Balance training, Stair training, Scar mobilization, and DME instructions  PLAN FOR NEXT SESSION:   *** Do 10th visit progress note & [employer] reauthorization, do updated PSFS, perform Berg balance,  work on standing balance with UE resistance bands.      Susannah Daring, PT, DPT 04/24/24 4:15 PM      Referring diagnosis? Z89.511 (ICD-10-CM) - Right below-knee amputee (has history left BKA so now Bil. BKAs) Treatment diagnosis? (if different than referring diagnosis) R26.89, M62.81, M25.661, R26.81 What was this (referring dx) caused by? [x]  Surgery []  Fall []  Ongoing issue []  Arthritis []  Other: ____________  Laterality: []  Rt []  Lt [x]  Both  Check all possible CPT codes:  *CHOOSE 10 OR LESS*    See Planned Interventions listed in the Plan section of the Evaluation.

## 2024-04-25 ENCOUNTER — Ambulatory Visit (INDEPENDENT_AMBULATORY_CARE_PROVIDER_SITE_OTHER)

## 2024-04-25 DIAGNOSIS — M25661 Stiffness of right knee, not elsewhere classified: Secondary | ICD-10-CM

## 2024-04-25 DIAGNOSIS — R2689 Other abnormalities of gait and mobility: Secondary | ICD-10-CM

## 2024-04-25 DIAGNOSIS — M6281 Muscle weakness (generalized): Secondary | ICD-10-CM | POA: Diagnosis not present

## 2024-04-25 DIAGNOSIS — R2681 Unsteadiness on feet: Secondary | ICD-10-CM

## 2024-04-30 ENCOUNTER — Encounter: Attending: Registered Nurse | Admitting: Physical Medicine and Rehabilitation

## 2024-04-30 ENCOUNTER — Encounter: Payer: Self-pay | Admitting: Physical Medicine and Rehabilitation

## 2024-04-30 VITALS — BP 129/75 | HR 102 | Ht 74.0 in | Wt 200.6 lb

## 2024-04-30 DIAGNOSIS — Z89511 Acquired absence of right leg below knee: Secondary | ICD-10-CM | POA: Diagnosis present

## 2024-04-30 DIAGNOSIS — G8918 Other acute postprocedural pain: Secondary | ICD-10-CM | POA: Diagnosis present

## 2024-04-30 DIAGNOSIS — E1169 Type 2 diabetes mellitus with other specified complication: Secondary | ICD-10-CM | POA: Insufficient documentation

## 2024-04-30 DIAGNOSIS — S88112A Complete traumatic amputation at level between knee and ankle, left lower leg, initial encounter: Secondary | ICD-10-CM | POA: Diagnosis present

## 2024-04-30 DIAGNOSIS — Z794 Long term (current) use of insulin: Secondary | ICD-10-CM

## 2024-04-30 NOTE — Progress Notes (Signed)
 Subjective:    Patient ID: Kevin Bradford, male    DOB: April 23, 1946, 78 y.o.   MRN: [id number]  HPI: Kevin Bradford is a 78 y.o. male who is here for HFU appointment for F/U of his Right Below knee amputation and Type 2 DM on insulin . He presented to [facility] on 11/28/2023, with right foot infection/ Abscess.  Kevin Collet PA- C H&P: Chief Complaint: Abscess and osteomyelitis right hindfoot.  HPI: Kevin Bradford is a 78 y.o. male who presents with foul-smelling ulcer right heel.  Patient has recently been hospitalized.  Hemoglobin A1c greater than 13.  Orthopedic Consulted: He underwent: on 12/20/2023;  Dr. Harden  AMPUTATION RIGHT BELOW KNEE   He was admitted to inpatient Rehabilitation on 12/25/2023 and discharged home on 01/06/2024. Has transitioned to outpatient therapy. He denies any pain. He rates his pain 0.  Reports he has a good appetite and following his diabetic Diet with good blood sugars at home he reports.   -he has been ambulating at home, he is able to do what he wants -he is accompanied by his wife today -he walked half a mile the other day -he is working on stairs -blood sugars are well controlled  Pain Inventory Average Pain 1 Pain Right Now 1 My pain is aching  In the last 24 hours, has pain interfered with the following? General activity 0 Relation with others 0 Enjoyment of life 0 What TIME of day is your pain at its worst? evening Sleep (in general) Good  Pain is worse with: walking Pain improves with: rest Relief from Meds: .  ability to climb steps?  no do you drive?  no use a wheelchair  retired  [employer]  Hospital f/u  Hospital f/u    Family History  Problem Relation Age of Onset   Dementia Mother    Social History   Socioeconomic History   Marital status: Married    Spouse name: Not on file   Number of children: Not on file   Years of education: Not on file   Highest education level: Not on file  Occupational History    Not on file  Tobacco Use   Smoking status: Never   Smokeless tobacco: Never  Vaping Use   Vaping status: Never Used  Substance and Sexual Activity   Alcohol use: Yes    Alcohol/week: 1.0 standard drink of alcohol    Types: 1 Cans of beer per week    Comment:  maybe 1 beer 2 times a month-12/26/2017   Drug use: No   Sexual activity: Yes  Other Topics Concern   Not on file  Social History Narrative   Not on file   Social Drivers of Health   Financial Resource Strain: Low Risk  (05/06/2018)   Overall Financial Resource Strain (CARDIA)    Difficulty of Paying Living Expenses: Not hard at all  Food Insecurity: No Food Insecurity (12/18/2023)   Hunger Vital Sign    Worried About Running Out of Food in the Last Year: Never true    Ran Out of Food in the Last Year: Never true  Transportation Needs: Unmet Transportation Needs (12/18/2023)   PRAPARE - Transportation    Lack of Transportation (Medical): No    Lack of Transportation (Non-Medical): Yes  Physical Activity: Sufficiently Active (05/06/2018)   Exercise Vital Sign    Days of Exercise per Week: 5 days    Minutes of Exercise per Session: 120 min  Stress: No Stress Concern Present (  05/06/2018)   [employer] [employer] - Occupational Stress Questionnaire    Feeling of Stress : Not at all  Social Connections: Moderately Isolated (12/19/2023)   Social Connection and Isolation Panel    Frequency of Communication with Friends and Family: Three times a week    Frequency of Social Gatherings with Friends and Family: Three times a week    Attends Religious Services: Never    Active Member of Clubs or Organizations: No    Attends Banker Meetings: Never    Marital Status: Married   Past Surgical History:  Procedure Laterality Date   AMPUTATION Left 07/19/2017   Procedure: LEFT TRANSMETATARSAL AMPUTATION;  Surgeon: Harden Jerona GAILS, MD;  Location: [facility] OR;  Service: Orthopedics;  Laterality: Left;   AMPUTATION  Left 08/16/2017   REVISION LEFT TRANSMETATARSAL AMPUTATION   AMPUTATION Left 11/22/2017   Procedure: LEFT BELOW KNEE AMPUTATION;  Surgeon: Harden Jerona GAILS, MD;  Location: [facility] OR;  Service: Orthopedics;  Laterality: Left;   AMPUTATION Right 12/20/2023   Procedure: AMPUTATION RIGHT BELOW KNEE;  Surgeon: Harden Jerona GAILS, MD;  Location: [facility] OR;  Service: Orthopedics;  Laterality: Right;   CATARACT EXTRACTION W/ INTRAOCULAR LENS  IMPLANT, BILATERAL  ~ 2016   EYE SURGERY Bilateral    catarct   I & D EXTREMITY Left 10/17/2014   Procedure: IRRIGATION AND DEBRIDEMENT EXTREMITY LEFT FOOT;  Surgeon: Jerona Harden GAILS, MD;  Location: [facility] OR;  Service: Orthopedics;  Laterality: Left;   IR ANGIO INTRA EXTRACRAN SEL COM CAROTID INNOMINATE BILAT MOD SED  05/07/2018   IR ANGIO VERTEBRAL SEL SUBCLAVIAN INNOMINATE BILAT MOD SED  05/07/2018   STUMP REVISION Left 08/16/2017   Procedure: REVISION LEFT TRANSMETATARSAL AMPUTATION;  Surgeon: Harden Jerona GAILS, MD;  Location: [facility] OR;  Service: Orthopedics;  Laterality: Left;   STUMP REVISION Left 12/27/2017   Procedure: LEFT BELOW KNEE AMPUTATION REVISION;  Surgeon: Harden Jerona GAILS, MD;  Location: [facility] OR;  Service: Orthopedics;  Laterality: Left;   TOE AMPUTATION Right 2013   5th toe   Past Medical History:  Diagnosis Date   Arthritis    joint pain (08/16/2017)   Below knee amputation (HCC)    Cellulitis and abscess of leg 10/18/2014   CKD (chronic kidney disease) stage 3, GFR 30-59 ml/min (HCC)    creatinine increases with antibiotics per pt, no known kidney disease per patient   Dehiscence of closure of skin, subsequent encounter    Diabetic foot ulcer (HCC)    left   Diabetic peripheral neuropathy (HCC)    GERD (gastroesophageal reflux disease)    12/26/2017- not this year.    Osteomyelitis (HCC)    left transmetatarsal    Stroke (HCC)    Type II diabetes mellitus (HCC)    BP 129/75 (BP Location: Left Arm, Patient Position: Sitting, Cuff Size: Normal)   Pulse (!) 102    Ht 6' 2 (1.88 m)   Wt 200 lb 9.6 oz (91 kg)   SpO2 97%   BMI 25.76 kg/m   Opioid Risk Score:   Fall Risk Score:  `1  Depression screen PHQ 2/9     05/22/2018    2:00 PM 02/21/2018   10:04 AM  Depression screen PHQ 2/9  Decreased Interest 0 0  Down, Depressed, Hopeless 0 0  PHQ - 2 Score 0 0     Review of Systems     Objective:   Physical Exam Vitals and nursing note reviewed.  Constitutional:  Appearance: Normal appearance.  Cardiovascular:     Rate and Rhythm: Normal rate and regular rhythm.     Pulses: Normal pulses.     Heart sounds: Normal heart sounds.  Pulmonary:     Effort: Pulmonary effort is normal.     Breath sounds: Normal breath sounds.  Musculoskeletal:     Comments: Normal Muscle Bulk and Muscle Testing Reveals:  Upper Extremities: Full ROM and Muscle Strength 5/5  Lower Extremities: Right: BKA: Suture Intact: No drainage  noted: Left BKA: Wearing Prosthesis  Arrived in wheelchair     Skin:    General: Skin is warm and dry.  Neurological:     Mental Status: He is alert and oriented to person, place, and time.  Psychiatric:        Mood and Affect: Mood normal.        Behavior: Behavior normal.   Ambulating with walker Bilateral amputees with prostheses        Assessment & Plan:  Right Below knee amputation: Dr Harden Following, continue outpatient therapy  2.  Type 2 DM on insulin : Continue current medication regimen: PCP Following. Continue to Monitor.  -continue insulin  -continue Tresiba -d/c vitamin A supple,ent  3. History of Left BKA: Wearing Prosthesis , continue following with Dr. Harden  4, Postoperative pain:  -d/c tramadol   4. Postoperative pain  F/U with Dr Lorilee in 4-  6 weeks

## 2024-05-07 ENCOUNTER — Encounter: Payer: Self-pay | Admitting: Physical Therapy

## 2024-05-07 ENCOUNTER — Ambulatory Visit: Admitting: Physical Therapy

## 2024-05-07 DIAGNOSIS — R2681 Unsteadiness on feet: Secondary | ICD-10-CM | POA: Diagnosis not present

## 2024-05-07 DIAGNOSIS — M25661 Stiffness of right knee, not elsewhere classified: Secondary | ICD-10-CM | POA: Diagnosis not present

## 2024-05-07 DIAGNOSIS — R2689 Other abnormalities of gait and mobility: Secondary | ICD-10-CM

## 2024-05-07 DIAGNOSIS — M6281 Muscle weakness (generalized): Secondary | ICD-10-CM | POA: Diagnosis not present

## 2024-05-07 NOTE — Therapy (Signed)
 OUTPATIENT PHYSICAL THERAPY PROSTHETIC TREATMENT   Patient Name: Kevin Bradford MRN: [id number] DOB:[birth year], 78 y.o., male Today's Date: 05/07/2024  END OF SESSION:  PT End of Session - 05/07/24 1342     Visit Number 11    Number of Visits 30    Date for Recertification  07/04/24    Authorization Type [employer]    Authorization Time Period Authorization #[id number]  Tracking #[id number]  04/25/2024-07/04/2024  10 PT Visit    Authorization - Visit Number 1    Authorization - Number of Visits 10    Progress Note Due on Visit 10    PT Start Time 1342    PT Stop Time 1430    PT Time Calculation (min) 48 min    Equipment Utilized During Treatment Gait belt    Activity Tolerance Patient tolerated treatment well    Behavior During Therapy WFL for tasks assessed/performed                 Past Medical History:  Diagnosis Date   Arthritis    joint pain (08/16/2017)   Below knee amputation (HCC)    Cellulitis and abscess of leg 10/18/2014   CKD (chronic kidney disease) stage 3, GFR 30-59 ml/min (HCC)    creatinine increases with antibiotics per pt, no known kidney disease per patient   Dehiscence of closure of skin, subsequent encounter    Diabetic foot ulcer (HCC)    left   Diabetic peripheral neuropathy (HCC)    GERD (gastroesophageal reflux disease)    12/26/2017- not this year.    Osteomyelitis (HCC)    left transmetatarsal    Stroke (HCC)    Type II diabetes mellitus [facility])    Past Surgical History:  Procedure Laterality Date   AMPUTATION Left 07/19/2017   Procedure: LEFT TRANSMETATARSAL AMPUTATION;  Surgeon: Harden Jerona GAILS, MD;  Location: [facility] OR;  Service: Orthopedics;  Laterality: Left;   AMPUTATION Left 08/16/2017   REVISION LEFT TRANSMETATARSAL AMPUTATION   AMPUTATION Left 11/22/2017   Procedure: LEFT BELOW KNEE AMPUTATION;  Surgeon: Harden Jerona GAILS, MD;  Location: [facility] OR;  Service: Orthopedics;  Laterality: Left;   AMPUTATION Right 12/20/2023   Procedure:  AMPUTATION RIGHT BELOW KNEE;  Surgeon: Harden Jerona GAILS, MD;  Location: [facility] OR;  Service: Orthopedics;  Laterality: Right;   CATARACT EXTRACTION W/ INTRAOCULAR LENS  IMPLANT, BILATERAL  ~ 2016   EYE SURGERY Bilateral    catarct   I & D EXTREMITY Left 10/17/2014   Procedure: IRRIGATION AND DEBRIDEMENT EXTREMITY LEFT FOOT;  Surgeon: Jerona Harden GAILS, MD;  Location: [facility] OR;  Service: Orthopedics;  Laterality: Left;   IR ANGIO INTRA EXTRACRAN SEL COM CAROTID INNOMINATE BILAT MOD SED  05/07/2018   IR ANGIO VERTEBRAL SEL SUBCLAVIAN INNOMINATE BILAT MOD SED  05/07/2018   STUMP REVISION Left 08/16/2017   Procedure: REVISION LEFT TRANSMETATARSAL AMPUTATION;  Surgeon: Harden Jerona GAILS, MD;  Location: [facility] OR;  Service: Orthopedics;  Laterality: Left;   STUMP REVISION Left 12/27/2017   Procedure: LEFT BELOW KNEE AMPUTATION REVISION;  Surgeon: Harden Jerona GAILS, MD;  Location: [facility] OR;  Service: Orthopedics;  Laterality: Left;   TOE AMPUTATION Right 2013   5th toe   Patient Active Problem List   Diagnosis Date Noted   Coping style affecting medical condition 12/29/2023   Malnutrition of moderate degree 12/25/2023   Right below-knee amputee (HCC) 12/25/2023   Diabetic infection of right foot (HCC) 12/16/2023   History of stroke 12/16/2023   Acute  osteomyelitis of right calcaneus (HCC) 12/16/2023   Cutaneous abscess of right foot 12/16/2023   DKA, type 2 (HCC) 12/08/2023   Hypokalemia 12/08/2023   S/P BKA (below knee amputation), left (HCC) 12/08/2023   Hyperosmolar hyperglycemic state (HHS) (HCC) 12/07/2023   Stroke (HCC)    Symptomatic bradycardia 05/06/2018   Postural dizziness with near syncope 05/06/2018   Labile blood glucose    Acute blood loss anemia    Labile blood pressure    CKD (chronic kidney disease) stage 2, GFR 60-89 ml/min    Type 2 diabetes mellitus with peripheral neuropathy (HCC)    Post-operative pain    Amputation of left lower extremity below knee upon examination (HCC) 11/24/2017    Amputated below knee, left (HCC) 11/22/2017   Osteomyelitis of left foot (HCC)    Dehiscence of amputation stump (HCC) 08/11/2017   S/P transmetatarsal amputation of foot, left (HCC) 07/19/2017   Subacute osteomyelitis, left ankle and foot (HCC) 07/17/2017   Diabetic ulcer of left foot associated with type 2 diabetes mellitus (HCC)    Diabetes mellitus with renal complications (HCC) 10/17/2014   Diabetes (HCC) 10/13/2014   Normocytic anemia 10/13/2014   Hyperkalemia 10/13/2014    PCP: Ransom Other, MD  REFERRING PROVIDER: Harden Jerona GAILS, MD  ONSET DATE: prosthesis delivery: 03/15/2024  REFERRING DIAG: S10.488 (ICD-10-CM) - Right below-knee amputee (HCC)   THERAPY DIAG:  Other abnormalities of gait and mobility  Muscle weakness (generalized)  Unsteadiness on feet  Stiffness of right knee, not elsewhere classified  Rationale for Evaluation and Treatment: Rehabilitation  SUBJECTIVE:   SUBJECTIVE STATEMENT:  Patient reports increased activity tolerance with outside of PT.  He moved back up stairs to the bedroom now that he can ambulate on stairs with PT instruction. Pt accompanied by: significant other  PERTINENT HISTORY:  Right TTA 12/20/23, L TTA 2019, DM2, CKD3, PVD, arthritis, peripheral neuropathy, CVA,   Patient is a 78 y.o. male who arrives to PT session with new Rt BKA (12/20/2023) and prostheses (first RLE & new LLE) delivery date 03/15/2024. Patient has a history of previous Lt BKA in 2019. Patient reports being able to stand at sink with UE support. Patient has new prosthesis with vacuum pin suction on both LEs which is new system that he is not familiar with. Patient has progressed wear of Rt prosthesis from 1 hour 2x/day up to 3 hrs, 2x/ day with minimal discomfort.   PAIN:  Are you having pain? No  PRECAUTIONS: None  WEIGHT BEARING RESTRICTIONS: No  FALLS: Has patient fallen in last 6 months? No  LIVING ENVIRONMENT: Lives with: lives with their spouse Lives  in: House Home Access: Ramped entrance or by garage which is one step Home layout: Two level, 1/2 bath on main level (not accessible with w/c), and Bed/bath upstairs Stairs: Yes: Internal: 16 steps; on left going up Has following equipment at home: Single point cane, Environmental consultant - 2 wheeled, Wheelchair (manual), shower chair, and Tour manager  OCCUPATION: retired, works as Art therapist on occasion  PLOF: modified independence  PATIENT GOALS: function with both prostheses at community level, doing yard Corporate treasurer   MD appt: follow up 04/30/2024   OBJECTIVE:  Patient-Specific Activity Scoring Scheme  0 represents "unable to perform." 10 represents "able to perform at prior level. 0 1 2 3 4 5 6 7 8 9  10 (Date and Score)  Activity Eval  03/20/24  04/25/2024  1.  walking 0   4  2. standing ADLs  0  8  3. drive 0 0  4.stairs  0 7  5. Yardwork/handyman work 0 6  Score 0 5   Total score = sum of the activity scores/number of activities Minimum detectable change (90%CI) for average score = 2 points Minimum detectable change (90%CI) for single activity score = 3 points  COGNITION: Overall cognitive status: Within functional limits for tasks assessed   SENSATION: WFL  POSTURE: rounded shoulders, forward head, flexed trunk , and weight shift left  LOWER EXTREMITY ROM:  ROM P:passive  A:active Right eval Left eval  Hip flexion    Hip extension    Hip abduction    Hip adduction    Hip internal rotation    Hip external rotation    Knee flexion    Knee extension Seated  P: -5* Seated  P: 0*  Ankle dorsiflexion    Ankle plantarflexion    Ankle inversion    Ankle eversion     (Blank rows = not tested)  LOWER EXTREMITY MMT:  MMT Right eval Left eval  Hip flexion    Hip extension 3-/5 4-/5  Hip abduction 3/5 4-/5  Hip adduction    Hip internal rotation    Hip external rotation    Knee flexion 3/5   Knee extension 3/5   At Evaluation all strength  testing is grossly seated and functionally standing / gait. (Blank rows = not tested)  TRANSFERS: Sit to stand: Mod A, dependent on heavy BUE support 18 chair with armrests, use RW to stabilize to stand upright Stand to sit: Min A with BUE support on armrests to 18 chair, requires RW for stability Lateral scoot transfers: SBA with BUE support and 2 chairs touching same height  FUNCTIONAL TESTs:  Pt requires minA with heavy BUE support on RW for static standing balance.   GAIT: Gait pattern: step to pattern, decreased step length- Right, decreased step length- Left, decreased stance time- Right, decreased hip/knee flexion- Right, Right hip hike, knee flexed in stance- Right, lateral hip instability, trunk flexed, and wide BOS Distance walked: 22' Assistive device utilized: Environmental consultant - 2 wheeled Level of assistance: Mod A   CURRENT PROSTHETIC WEAR ASSESSMENT: 03/20/2024  Patient is dependent with: skin check, residual limb care, prosthetic cleaning, ply sock cleaning, correct ply sock adjustment, proper wear schedule/adjustment, and proper weight-bearing schedule/adjustment Donning prosthesis: Mod A Doffing prosthesis: Mod A Prosthetic wear tolerance: progressed up to 3 hours, 2x/day for last day,  he has worn right prosthesis daily since delivery 6 days ago.  He is wearing Left prosthesis all awake hours.  Prosthetic weight bearing tolerance: pt tolerated standing & gait with RW support for 5 min total with no c/o limb pain.  Edema: RLE pitting with 5-second capillary refill.  None noted in LLE. Residual limb condition:   Right: bone length 7, limb length 8, scar healed, no hair growth, normal skin color and temperature, 1 medial wound 2mm which is clean with no signs of infection, cylindrical shape  Left: conical shape, shrunk with bony landmarks prominent, dark & callused pressure areas at fibula head, tibial tuberosity and lateral tibial plateau.  Prosthetic description: total contact  socket with backing pin suction, dynamic response feet    [facility] PT Assessment - 04/25/24 0001       Berg Balance Test   Sit to Stand Able to stand  independently using hands    Standing Unsupported Able to stand 30 seconds unsupported    Sitting with Back Unsupported but  Feet Supported on Floor or Stool Able to sit safely and securely 2 minutes    Stand to Sit Controls descent by using hands    Transfers Able to transfer safely, definite need of hands    Standing Unsupported with Eyes Closed Able to stand 10 seconds with supervision    Standing Unsupported with Feet Together Able to place feet together independently but unable to hold for 30 seconds    From Standing, Reach Forward with Outstretched Arm Can reach forward >12 cm safely (5)    From Standing Position, Pick up Object from Floor Able to pick up shoe, needs supervision    From Standing Position, Turn to Look Behind Over each Shoulder Needs supervision when turning    Turn 360 Degrees Needs close supervision or verbal cueing    Standing Unsupported, Alternately Place Feet on Step/Stool Able to complete >2 steps/needs minimal assist    Standing Unsupported, One Foot in Front Able to take small step independently and hold 30 seconds    Standing on One Leg Tries to lift leg/unable to hold 3 seconds but remains standing independently    Total Score 32    Berg comment: intermittent CGA throughout with RW use in between activities           TODAY'S TREATMENT:                                                                                                                             DATE:  05/07/2024 Prosthetic Care with bilateral BKA prostheses: PT recommended [facility] fire department signal 3 program for safety in case of an emergency.  Patient and wife verbalized understanding. PT verbally educated on patient in case of a fall that he first needs to make sure nothing has been fractured and then to make sure need a prosthesis  that is rotated on the residual limb.  PT demo and verbal cues on floor transfer utilizing chair seat for BUE support via half kneeling.  Patient performed 1 rep kneeling on both the right knee and on the left knee with supervision.  Patient and wife verbalized better understanding.  PT also used Internet to introduce use of a garden kneel bench that may make kneeling activities easier with less pain. Patient ambulated >300' working on gait with single SBQC in each hand and progressed to carrying water with the free hand.  PT noted no difference with the cane in the right or left hand.  Patient required intermittent minA with right knee instability. Patient ambulated 150' with cane stand-alone tip working on simple cognitive task with supervision. PT reviewed adjusting ply socks and patient verbalized better understanding. Patient ambulated 25' without an assistive device with minA.     TREATMENT:  DATE:  04/25/2024 Prosthetic Care with bilateral BKA prostheses: Patient performed BERG balance assessment with supervision>intermittent CGA and intermittent RW use between activities during assessment. Results noted above and discussed with patient and spouse above. PT discussed importance of balance with mobility.  Patient performed gait speed assessment with bilat quad canes and supervision using 4-point technique for a few steps before transitioning to 2-point technique. Patient performed one trial for 20' prior to assessment while verbalizing appropriate 4-point and 2-point technique. Self-selected speed: 1.37 ft/s Fast-as-comfortable speed: 2.0 ft/s  PT discussed results with patient and spouse and discussed why it is important to assess both walking speeds as it translates to community ambulation. Patient and spouse acknowledge.  PT discussing and demonstrating modified 2-point  and 2-point technique with unilat quad cane in Lt UE. Patient performed 2x20' with modified 2-point technique and CGA. During first round, patient performed with moderately slow cadence and overextension of Lt UE with cane; PT provided verbal cues throughout first 10' for appropriate modified 2-point technique as well as appropriate placement of cane with patient performing with excellent carryover throughout. During second round, patient performed with quicker gait speed, decreased anxiety from sensation of imbalance, and improved quad cane placement.  PT discussed with patient about calf soreness/discomfort with patient as he mentioned experiencing tightness at home; however, no tibial pain. PT discussed options for performing isometric contractions for gastroc if he were to experience pain and then had patient perform 1x30s gastroc stretch with each LE on step using treadmill for UE use for balance.  Patient performed descent of stairs within lobby using bilat rails and CGA from PT performing 2 feet to a stair with reciprocal pattern. Patient verbalizing appropriate technique at beginning with patient providing intermittent verbal cues for foot placement and control of descent. Patient transitioned to full reciprocal pattern for second and third set of steps with no noted deficits, though patient endorsing heavy use of bilat UEs.    TREATMENT:                                                                                                                             DATE:  04/23/2024 Prosthetic Care with bilateral BKA prostheses: Pt amb 120' with 2 SBQCs with CGA with PT cues on upright posture. Pt amb 63' with 1 SBQC in RUE then 64' in LUE with CGA.  Progressed to carrying small ball on plate in UE without SBQC. Pt able to perform with CGA. Stairs: Ascending and descending with single rail and cane and contralateral upper extremity step to pattern alternating lead LE with contact-guard assist 4 steps  with rail on each side; alternating pattern with 2 rails for 4 steps 2 reps with minA.    Sit to stand from 18 chair without armrests using BUEs on seat with minA/CGA.  From 18 chair using 1 armrest and contralateral upper extremity on chair seat with supervision 2 reps with each upper extremity.  Worked on not  touching RW for stabilization upon arising. Looking over shoulders with single upper extremity hovering over walker with intermittent touch needed   TREATMENT:                                                                                                                             DATE:  04/18/2024 Prosthetic Care with bilateral BKA prostheses: PT discussed with patient and spouse on rationale of using bilat SPC prior to unilat SPC. Patient and spouse acknowledged and agrees with process for optimum safety and return to independence.  Patient ambulated 73'3 with bilat SPC prior to physical performance assessment with CGA. PT required patient to verbally recall appropriate 2-point technique with bilat SPC; patient able to recall and correctly verbalize appropriate form. Patient began with 4-point technique for ~5' before transitioning to 2-point technique. PT discussed overall improvement with appropriate form with patient and spouse and discussed importance of continuing to practice in order to improve confidence and steadiness with bilat SPC (prior to introducing gait with unilat SPC). Patient and spouse acknowledging and agreeing.  PT discussed POC and potential progressions to be made within POC with the potential for recertification in order to optimize maximum functional independence. Patient and spouse acknowledged and agreed.  Physical Performance: Patient performed 6 min walk test with bilat SPC, mostly 2-point technique, and CGA throughout (though could have been decreased to SBA). Patient completed 96' with intermittent stutter step and intermittent stepping on Lt SPC, though able  to self correct. Patient able to cognitively dual task throughout entire assessment. Patient completed with a HR of 105bpm and SpO2 of 97-98%.  PT discussed results of assessment and performance throughout with patient and spouse.     PATIENT EDUCATION: PATIENT EDUCATED ON FOLLOWING PROSTHETIC CARE: Education details: Alterations required long pants height modification for safety as prosthetist decreased his overall height by approximately 2 inches, shrinker use when prosthesis is off,  Skin check, Residual limb care, Prosthetic cleaning, Correct ply sock adjustment, Propper donning, Proper doffing, Proper wear schedule/adjustment, and Proper weight-bearing schedule/adjustment Prosthetic wear tolerance: 3 hours 2x/day, 7 days/week.  If after 5 days no unusual skin changes or pressure intolerance noted then can increase to 4 hours 2x/day Person educated: Patient and Spouse Education method: Explanation, Demonstration, Tactile cues, and Verbal cues Education comprehension: verbalized understanding, verbal cues required, tactile cues required, and needs further education  HOME EXERCISE PROGRAM:  ASSESSMENT:  CLINICAL IMPRESSION:  Patient improve mobility with single cane support.  Patient is better understanding how to get off the floor in case of a fall.  Patient will continue to benefit from skilled PT.   OBJECTIVE IMPAIRMENTS: Abnormal gait, decreased activity tolerance, decreased balance, decreased coordination, decreased endurance, decreased knowledge of use of DME, decreased mobility, difficulty walking, decreased ROM, decreased strength, impaired flexibility, improper body mechanics, postural dysfunction, and prosthetic dependency .   ACTIVITY LIMITATIONS: carrying, lifting, bending, sitting, standing, squatting, stairs, transfers, bathing, toileting, dressing, reach over head, hygiene/grooming,  and locomotion level  PARTICIPATION LIMITATIONS: meal prep, cleaning, driving, shopping,  community activity, occupation, and yard work  PERSONAL FACTORS: Age, Fitness, Past/current experiences, Time since onset of injury/illness/exacerbation, and 3+ comorbidities: see PMH are also affecting patient's functional outcome.   REHAB POTENTIAL: Good  CLINICAL DECISION MAKING: Evolving/moderate complexity  EVALUATION COMPLEXITY: Moderate   GOALS: Goals reviewed with patient? Yes  SHORT TERM GOALS: Target date: 04/19/2024  Patient donnes prosthesis modified independent & verbalizes proper cleaning. Baseline: SEE OBJECTIVE DATA Goal status: MET   04/16/2024 2.  Patient tolerates right prosthesis >10 hrs total /day without skin issues or limb pain. Baseline: SEE OBJECTIVE DATA Goal status:  MET 04/16/2024  3.  Patient able to reach 7 and look over both shoulders with RW support with supervision. Baseline: SEE OBJECTIVE DATA Goal status: Met 04/16/2024  4. Patient ambulates 62' with RW & prosthesis with CGA. Baseline: SEE OBJECTIVE DATA Goal status: MET   04/16/2024  5. Patient transfers sit to/from stand 18 chair with armrest to RW with CGA.  Baseline: SEE OBJECTIVE DATA Goal status: MET   04/16/2024  LONG TERM GOALS: Target date: 07/04/2024  Patient demonstrates & verbalized understanding of prosthetic care to enable safe utilization of prostheses. Baseline: SEE OBJECTIVE DATA Goal status: ongoing    05/07/2024  Patient tolerates prostheses wear >90% of awake hours without skin or limb pain issues. Baseline: SEE OBJECTIVE DATA Goal status: ongoing   05/07/2024  Patient ambulates >500' with LRAD and prostheses independently Baseline: SEE OBJECTIVE DATA Goal status: ongoing    05/07/2024  Patient negotiates ramps, curbs & stairs with single rail with prostheses with LRAD independently. Baseline: SEE OBJECTIVE DATA Goal status: ongoing    05/07/2024  Patient demonstrates & verbalizes lifting, carrying, pushing, pulling with prostheses only safely. Baseline: SEE  OBJECTIVE DATA Goal status: ongoing    05/07/2024  6.  Patient reports Patient-Specific Activity Score improved the average >/= 6 to indicate improvement in functional activities.   Baseline: SEE OBJECTIVE DATA Goal status: ongoing   05/07/2024  PLAN:  PT FREQUENCY: 2x/week  PT DURATION: 10 weeks  PLANNED INTERVENTIONS: [id number]- PT Re-evaluation, 97750- Physical Performance Testing, 97110-Therapeutic exercises, 97530- Therapeutic activity, [id number]- Neuromuscular re-education, 97535- Self Care, [id number]- Manual therapy, [phone]- Gait training, [phone]- Prosthetic Initial , [phone]- Orthotic/Prosthetic subsequent, Patient/Family education, Balance training, Stair training, Scar mobilization, and DME instructions  PLAN FOR NEXT SESSION:     work on standing balance with UE resistance bands, static and dynamic balance, ambulation with unilat quad canes and without assistive device. LE strength     Grayce Spatz, PT, DPT 05/07/2024, 4:56 PM    Referring diagnosis? Z89.511 (ICD-10-CM) - Right below-knee amputee (has history left BKA so now Bil. BKAs) Treatment diagnosis? (if different than referring diagnosis) R26.89, M62.81, M25.661, R26.81 What was this (referring dx) caused by? [x]  Surgery []  Fall []  Ongoing issue []  Arthritis []  Other: ____________  Laterality: []  Rt []  Lt [x]  Both  Check all possible CPT codes:  *CHOOSE 10 OR LESS*    See Planned Interventions listed in the Plan section of the Evaluation.

## 2024-05-09 ENCOUNTER — Ambulatory Visit: Admitting: Physical Therapy

## 2024-05-09 ENCOUNTER — Encounter: Payer: Self-pay | Admitting: Physical Therapy

## 2024-05-09 DIAGNOSIS — M25661 Stiffness of right knee, not elsewhere classified: Secondary | ICD-10-CM

## 2024-05-09 DIAGNOSIS — M6281 Muscle weakness (generalized): Secondary | ICD-10-CM | POA: Diagnosis not present

## 2024-05-09 DIAGNOSIS — R2681 Unsteadiness on feet: Secondary | ICD-10-CM | POA: Diagnosis not present

## 2024-05-09 DIAGNOSIS — R2689 Other abnormalities of gait and mobility: Secondary | ICD-10-CM | POA: Diagnosis not present

## 2024-05-09 NOTE — Therapy (Signed)
 OUTPATIENT PHYSICAL THERAPY PROSTHETIC TREATMENT   Patient Name: Kevin Bradford MRN: [id number] DOB:Oct 03, 1945, 78 y.o., male Today's Date: 05/09/2024  END OF SESSION:  PT End of Session - 05/09/24 1256     Visit Number 12    Number of Visits 30    Date for Recertification  07/04/24    Authorization Type [employer]    Authorization Time Period Authorization #[id number]  Tracking #[id number]  04/25/2024-07/04/2024  10 PT Visit    Authorization - Visit Number 2    Authorization - Number of Visits 10    Progress Note Due on Visit 10    PT Start Time 1256    PT Stop Time 1342    PT Time Calculation (min) 46 min    Equipment Utilized During Treatment Gait belt    Activity Tolerance Patient tolerated treatment well    Behavior During Therapy WFL for tasks assessed/performed                  Past Medical History:  Diagnosis Date   Arthritis    joint pain (08/16/2017)   Below knee amputation (HCC)    Cellulitis and abscess of leg 10/18/2014   CKD (chronic kidney disease) stage 3, GFR 30-59 ml/min (HCC)    creatinine increases with antibiotics per pt, no known kidney disease per patient   Dehiscence of closure of skin, subsequent encounter    Diabetic foot ulcer (HCC)    left   Diabetic peripheral neuropathy (HCC)    GERD (gastroesophageal reflux disease)    12/26/2017- not this year.    Osteomyelitis (HCC)    left transmetatarsal    Stroke (HCC)    Type II diabetes mellitus [facility])    Past Surgical History:  Procedure Laterality Date   AMPUTATION Left 07/19/2017   Procedure: LEFT TRANSMETATARSAL AMPUTATION;  Surgeon: Harden Jerona GAILS, MD;  Location: [facility] OR;  Service: Orthopedics;  Laterality: Left;   AMPUTATION Left 08/16/2017   REVISION LEFT TRANSMETATARSAL AMPUTATION   AMPUTATION Left 11/22/2017   Procedure: LEFT BELOW KNEE AMPUTATION;  Surgeon: Harden Jerona GAILS, MD;  Location: [facility] OR;  Service: Orthopedics;  Laterality: Left;   AMPUTATION Right 12/20/2023    Procedure: AMPUTATION RIGHT BELOW KNEE;  Surgeon: Harden Jerona GAILS, MD;  Location: [facility] OR;  Service: Orthopedics;  Laterality: Right;   CATARACT EXTRACTION W/ INTRAOCULAR LENS  IMPLANT, BILATERAL  ~ 2016   EYE SURGERY Bilateral    catarct   I & D EXTREMITY Left 10/17/2014   Procedure: IRRIGATION AND DEBRIDEMENT EXTREMITY LEFT FOOT;  Surgeon: Jerona Harden GAILS, MD;  Location: [facility] OR;  Service: Orthopedics;  Laterality: Left;   IR ANGIO INTRA EXTRACRAN SEL COM CAROTID INNOMINATE BILAT MOD SED  05/07/2018   IR ANGIO VERTEBRAL SEL SUBCLAVIAN INNOMINATE BILAT MOD SED  05/07/2018   STUMP REVISION Left 08/16/2017   Procedure: REVISION LEFT TRANSMETATARSAL AMPUTATION;  Surgeon: Harden Jerona GAILS, MD;  Location: [facility] OR;  Service: Orthopedics;  Laterality: Left;   STUMP REVISION Left 12/27/2017   Procedure: LEFT BELOW KNEE AMPUTATION REVISION;  Surgeon: Harden Jerona GAILS, MD;  Location: [facility] OR;  Service: Orthopedics;  Laterality: Left;   TOE AMPUTATION Right 2013   5th toe   Patient Active Problem List   Diagnosis Date Noted   Coping style affecting medical condition 12/29/2023   Malnutrition of moderate degree 12/25/2023   Right below-knee amputee (HCC) 12/25/2023   Diabetic infection of right foot (HCC) 12/16/2023   History of stroke 12/16/2023  Acute osteomyelitis of right calcaneus (HCC) 12/16/2023   Cutaneous abscess of right foot 12/16/2023   DKA, type 2 (HCC) 12/08/2023   Hypokalemia 12/08/2023   S/P BKA (below knee amputation), left (HCC) 12/08/2023   Hyperosmolar hyperglycemic state (HHS) (HCC) 12/07/2023   Stroke (HCC)    Symptomatic bradycardia 05/06/2018   Postural dizziness with near syncope 05/06/2018   Labile blood glucose    Acute blood loss anemia    Labile blood pressure    CKD (chronic kidney disease) stage 2, GFR 60-89 ml/min    Type 2 diabetes mellitus with peripheral neuropathy (HCC)    Post-operative pain    Amputation of left lower extremity below knee upon examination (HCC)  11/24/2017   Amputated below knee, left (HCC) 11/22/2017   Osteomyelitis of left foot (HCC)    Dehiscence of amputation stump (HCC) 08/11/2017   S/P transmetatarsal amputation of foot, left (HCC) 07/19/2017   Subacute osteomyelitis, left ankle and foot (HCC) 07/17/2017   Diabetic ulcer of left foot associated with type 2 diabetes mellitus (HCC)    Diabetes mellitus with renal complications (HCC) 10/17/2014   Diabetes (HCC) 10/13/2014   Normocytic anemia 10/13/2014   Hyperkalemia 10/13/2014    PCP: Ransom Other, MD  REFERRING PROVIDER: Harden Jerona GAILS, MD  ONSET DATE: prosthesis delivery: 03/15/2024  REFERRING DIAG: S10.488 (ICD-10-CM) - Right below-knee amputee (HCC)   THERAPY DIAG:  Other abnormalities of gait and mobility  Muscle weakness (generalized)  Unsteadiness on feet  Stiffness of right knee, not elsewhere classified  Rationale for Evaluation and Treatment: Rehabilitation  SUBJECTIVE:   SUBJECTIVE STATEMENT:  No changes.  He is having no difficulty with stairs and home to enable him to sleep upstairs. Pt accompanied by: significant other  PERTINENT HISTORY:  Right TTA 12/20/23, L TTA 2019, DM2, CKD3, PVD, arthritis, peripheral neuropathy, CVA,   Patient is a 78 y.o. male who arrives to PT session with new Rt BKA (12/20/2023) and prostheses (first RLE & new LLE) delivery date 03/15/2024. Patient has a history of previous Lt BKA in 2019. Patient reports being able to stand at sink with UE support. Patient has new prosthesis with vacuum pin suction on both LEs which is new system that he is not familiar with. Patient has progressed wear of Rt prosthesis from 1 hour 2x/day up to 3 hrs, 2x/ day with minimal discomfort.   PAIN:  Are you having pain? No  PRECAUTIONS: None  WEIGHT BEARING RESTRICTIONS: No  FALLS: Has patient fallen in last 6 months? No  LIVING ENVIRONMENT: Lives with: lives with their spouse Lives in: House Home Access: Ramped entrance or by garage  which is one step Home layout: Two level, 1/2 bath on main level (not accessible with w/c), and Bed/bath upstairs Stairs: Yes: Internal: 16 steps; on left going up Has following equipment at home: Single point cane, Environmental consultant - 2 wheeled, Wheelchair (manual), shower chair, and Tour manager  OCCUPATION: retired, works as Art therapist on occasion  PLOF: modified independence  PATIENT GOALS: function with both prostheses at community level, doing yard Corporate treasurer   MD appt: follow up 04/30/2024   OBJECTIVE:  Patient-Specific Activity Scoring Scheme  0 represents "unable to perform." 10 represents "able to perform at prior level. 0 1 2 3 4 5 6 7 8 9  10 (Date and Score)  Activity Eval  03/20/24  04/25/2024  1.  walking 0   4  2. standing ADLs  0  8  3. drive 0 0  4.stairs  0 7  5. Yardwork/handyman work 0 6  Score 0 5   Total score = sum of the activity scores/number of activities Minimum detectable change (90%CI) for average score = 2 points Minimum detectable change (90%CI) for single activity score = 3 points  COGNITION: Overall cognitive status: Within functional limits for tasks assessed   SENSATION: WFL  POSTURE: rounded shoulders, forward head, flexed trunk , and weight shift left  LOWER EXTREMITY ROM:  ROM P:passive  A:active Right eval Left eval  Hip flexion    Hip extension    Hip abduction    Hip adduction    Hip internal rotation    Hip external rotation    Knee flexion    Knee extension Seated  P: -5* Seated  P: 0*  Ankle dorsiflexion    Ankle plantarflexion    Ankle inversion    Ankle eversion     (Blank rows = not tested)  LOWER EXTREMITY MMT:  MMT Right eval Left eval  Hip flexion    Hip extension 3-/5 4-/5  Hip abduction 3/5 4-/5  Hip adduction    Hip internal rotation    Hip external rotation    Knee flexion 3/5   Knee extension 3/5   At Evaluation all strength testing is grossly seated and functionally standing /  gait. (Blank rows = not tested)  TRANSFERS: Sit to stand: Mod A, dependent on heavy BUE support 18 chair with armrests, use RW to stabilize to stand upright Stand to sit: Min A with BUE support on armrests to 18 chair, requires RW for stability Lateral scoot transfers: SBA with BUE support and 2 chairs touching same height  FUNCTIONAL TESTs:  Pt requires minA with heavy BUE support on RW for static standing balance.   GAIT: Gait pattern: step to pattern, decreased step length- Right, decreased step length- Left, decreased stance time- Right, decreased hip/knee flexion- Right, Right hip hike, knee flexed in stance- Right, lateral hip instability, trunk flexed, and wide BOS Distance walked: 22' Assistive device utilized: Environmental consultant - 2 wheeled Level of assistance: Mod A   CURRENT PROSTHETIC WEAR ASSESSMENT: 03/20/2024  Patient is dependent with: skin check, residual limb care, prosthetic cleaning, ply sock cleaning, correct ply sock adjustment, proper wear schedule/adjustment, and proper weight-bearing schedule/adjustment Donning prosthesis: Mod A Doffing prosthesis: Mod A Prosthetic wear tolerance: progressed up to 3 hours, 2x/day for last day,  he has worn right prosthesis daily since delivery 6 days ago.  He is wearing Left prosthesis all awake hours.  Prosthetic weight bearing tolerance: pt tolerated standing & gait with RW support for 5 min total with no c/o limb pain.  Edema: RLE pitting with 5-second capillary refill.  None noted in LLE. Residual limb condition:   Right: bone length 7, limb length 8, scar healed, no hair growth, normal skin color and temperature, 1 medial wound 2mm which is clean with no signs of infection, cylindrical shape  Left: conical shape, shrunk with bony landmarks prominent, dark & callused pressure areas at fibula head, tibial tuberosity and lateral tibial plateau.  Prosthetic description: total contact socket with backing pin suction, dynamic response  feet    [facility] PT Assessment - 04/25/24 0001       Berg Balance Test   Sit to Stand Able to stand  independently using hands    Standing Unsupported Able to stand 30 seconds unsupported    Sitting with Back Unsupported but Feet Supported on Floor or Stool Able to sit  safely and securely 2 minutes    Stand to Sit Controls descent by using hands    Transfers Able to transfer safely, definite need of hands    Standing Unsupported with Eyes Closed Able to stand 10 seconds with supervision    Standing Unsupported with Feet Together Able to place feet together independently but unable to hold for 30 seconds    From Standing, Reach Forward with Outstretched Arm Can reach forward >12 cm safely (5)    From Standing Position, Pick up Object from Floor Able to pick up shoe, needs supervision    From Standing Position, Turn to Look Behind Over each Shoulder Needs supervision when turning    Turn 360 Degrees Needs close supervision or verbal cueing    Standing Unsupported, Alternately Place Feet on Step/Stool Able to complete >2 steps/needs minimal assist    Standing Unsupported, One Foot in Front Able to take small step independently and hold 30 seconds    Standing on One Leg Tries to lift leg/unable to hold 3 seconds but remains standing independently    Total Score 32    Berg comment: intermittent CGA throughout with RW use in between activities           TODAY'S TREATMENT:                                                                                                                             DATE:  05/09/2024 Prosthetic Care with bilateral BKA prostheses: Patient ambulated 50' x 2 and 80' x 1 with single cane stand-alone tip with CGA.  Verbal cues on upright posture. Sit to stand from 18 chair without armrests using hands on seat with min/CGA requiring chair to be stabilized.  Patient stabilizes upon arising with cane stand-alone tip.  Neuromuscular reeducation: Working on standing  balance, core stability and functional strength for upright posture Standing resistive upper extremity exercises with PT minA for stability and intermittent touch on RW placed anteriorly for safety.  Green Thera-Band single upper extremity alternating and BUEs 10 reps each: Rows, forward reach and biceps curl with upward reach. Resistive gait with red Thera-Band L UE and cane stand-alone tip RUE 3 reps each walking out 5 feet and controlled return.  Resistance anteriorly and resistance posteriorly.  Patient requires min assist. Standing back extension leaning forward under control to place hands on seated with chair and returning to upright.  10 reps with CGA.  Therapeutic activities Leg press: PT cues on full motion with knee control Back at 45* BLE's 100# 12 reps. Back flat BLE's 100# 12 reps. Back flat BLE's marching to work on single-leg stance stability 100# 12 reps. Back flat single LE 50# 12 reps on each lower extremity Standing with back to doorframe working on upright posture: Holding each for 2 deep breaths trying to stretch upright in the trunk and lower extremities.  Single upper extremity overhead and double upper extremity overhead 2 reps each.  Patient  and wife verbalized understanding for this activity to be performed at home with recommendations to try to perform each time he is exiting the bathroom.  Therapeutic exercise Hip flexor stretch supine Thomas position 20 second hold for each lower extremity.    TREATMENT:                                                                                                                             DATE:  05/07/2024 Prosthetic Care with bilateral BKA prostheses: PT recommended [facility] fire department signal 3 program for safety in case of an emergency.  Patient and wife verbalized understanding. PT verbally educated on patient in case of a fall that he first needs to make sure nothing has been fractured and then to make sure need a  prosthesis that is rotated on the residual limb.  PT demo and verbal cues on floor transfer utilizing chair seat for BUE support via half kneeling.  Patient performed 1 rep kneeling on both the right knee and on the left knee with supervision.  Patient and wife verbalized better understanding.  PT also used Internet to introduce use of a garden kneel bench that may make kneeling activities easier with less pain. Patient ambulated >300' working on gait with single SBQC in each hand and progressed to carrying water with the free hand.  PT noted no difference with the cane in the right or left hand.  Patient required intermittent minA with right knee instability. Patient ambulated 150' with cane stand-alone tip working on simple cognitive task with supervision. PT reviewed adjusting ply socks and patient verbalized better understanding. Patient ambulated 25' without an assistive device with minA.     TREATMENT:                                                                                                                             DATE:  04/25/2024 Prosthetic Care with bilateral BKA prostheses: Patient performed BERG balance assessment with supervision>intermittent CGA and intermittent RW use between activities during assessment. Results noted above and discussed with patient and spouse above. PT discussed importance of balance with mobility.  Patient performed gait speed assessment with bilat quad canes and supervision using 4-point technique for a few steps before transitioning to 2-point technique. Patient performed one trial for 20' prior to assessment while verbalizing appropriate 4-point and 2-point technique. Self-selected speed: 1.37 ft/s Fast-as-comfortable speed: 2.0 ft/s  PT discussed  results with patient and spouse and discussed why it is important to assess both walking speeds as it translates to community ambulation. Patient and spouse acknowledge.  PT discussing and demonstrating modified  2-point and 2-point technique with unilat quad cane in Lt UE. Patient performed 2x20' with modified 2-point technique and CGA. During first round, patient performed with moderately slow cadence and overextension of Lt UE with cane; PT provided verbal cues throughout first 10' for appropriate modified 2-point technique as well as appropriate placement of cane with patient performing with excellent carryover throughout. During second round, patient performed with quicker gait speed, decreased anxiety from sensation of imbalance, and improved quad cane placement.  PT discussed with patient about calf soreness/discomfort with patient as he mentioned experiencing tightness at home; however, no tibial pain. PT discussed options for performing isometric contractions for gastroc if he were to experience pain and then had patient perform 1x30s gastroc stretch with each LE on step using treadmill for UE use for balance.  Patient performed descent of stairs within lobby using bilat rails and CGA from PT performing 2 feet to a stair with reciprocal pattern. Patient verbalizing appropriate technique at beginning with patient providing intermittent verbal cues for foot placement and control of descent. Patient transitioned to full reciprocal pattern for second and third set of steps with no noted deficits, though patient endorsing heavy use of bilat UEs.    TREATMENT:                                                                                                                             DATE:  04/23/2024 Prosthetic Care with bilateral BKA prostheses: Pt amb 120' with 2 SBQCs with CGA with PT cues on upright posture. Pt amb 29' with 1 SBQC in RUE then 28' in LUE with CGA.  Progressed to carrying small ball on plate in UE without SBQC. Pt able to perform with CGA. Stairs: Ascending and descending with single rail and cane and contralateral upper extremity step to pattern alternating lead LE with contact-guard assist 4  steps with rail on each side; alternating pattern with 2 rails for 4 steps 2 reps with minA.    Sit to stand from 18 chair without armrests using BUEs on seat with minA/CGA.  From 18 chair using 1 armrest and contralateral upper extremity on chair seat with supervision 2 reps with each upper extremity.  Worked on not touching RW for stabilization upon arising. Looking over shoulders with single upper extremity hovering over walker with intermittent touch needed    PATIENT EDUCATION: PATIENT EDUCATED ON FOLLOWING PROSTHETIC CARE: Education details: Alterations required long pants height modification for safety as prosthetist decreased his overall height by approximately 2 inches, shrinker use when prosthesis is off,  Skin check, Residual limb care, Prosthetic cleaning, Correct ply sock adjustment, Propper donning, Proper doffing, Proper wear schedule/adjustment, and Proper weight-bearing schedule/adjustment Prosthetic wear tolerance: 3 hours 2x/day, 7 days/week.  If after 5 days  no unusual skin changes or pressure intolerance noted then can increase to 4 hours 2x/day Person educated: Patient and Spouse Education method: Explanation, Demonstration, Tactile cues, and Verbal cues Education comprehension: verbalized understanding, verbal cues required, tactile cues required, and needs further education  HOME EXERCISE PROGRAM:  ASSESSMENT:  CLINICAL IMPRESSION:  PT session focused on balance activities facilitating core stabilization and grounding through bilateral lower extremities with prosthesis.  PT also worked on functional strength utilizing the leg press which she tolerated well.  Patient improved balance and stabilization with PT activities but fatigued by the end of the session.  Patient will continue to benefit from skilled PT.   OBJECTIVE IMPAIRMENTS: Abnormal gait, decreased activity tolerance, decreased balance, decreased coordination, decreased endurance, decreased knowledge of use of  DME, decreased mobility, difficulty walking, decreased ROM, decreased strength, impaired flexibility, improper body mechanics, postural dysfunction, and prosthetic dependency .   ACTIVITY LIMITATIONS: carrying, lifting, bending, sitting, standing, squatting, stairs, transfers, bathing, toileting, dressing, reach over head, hygiene/grooming, and locomotion level  PARTICIPATION LIMITATIONS: meal prep, cleaning, driving, shopping, community activity, occupation, and yard work  PERSONAL FACTORS: Age, Fitness, Past/current experiences, Time since onset of injury/illness/exacerbation, and 3+ comorbidities: see PMH are also affecting patient's functional outcome.   REHAB POTENTIAL: Good  CLINICAL DECISION MAKING: Evolving/moderate complexity  EVALUATION COMPLEXITY: Moderate   GOALS: Goals reviewed with patient? Yes  SHORT TERM GOALS: Target date: 04/19/2024  Patient donnes prosthesis modified independent & verbalizes proper cleaning. Baseline: SEE OBJECTIVE DATA Goal status: MET   04/16/2024 2.  Patient tolerates right prosthesis >10 hrs total /day without skin issues or limb pain. Baseline: SEE OBJECTIVE DATA Goal status:  MET 04/16/2024  3.  Patient able to reach 7 and look over both shoulders with RW support with supervision. Baseline: SEE OBJECTIVE DATA Goal status: Met 04/16/2024  4. Patient ambulates 18' with RW & prosthesis with CGA. Baseline: SEE OBJECTIVE DATA Goal status: MET   04/16/2024  5. Patient transfers sit to/from stand 18 chair with armrest to RW with CGA.  Baseline: SEE OBJECTIVE DATA Goal status: MET   04/16/2024  LONG TERM GOALS: Target date: 07/04/2024  Patient demonstrates & verbalized understanding of prosthetic care to enable safe utilization of prostheses. Baseline: SEE OBJECTIVE DATA Goal status: ongoing    05/07/2024  Patient tolerates prostheses wear >90% of awake hours without skin or limb pain issues. Baseline: SEE OBJECTIVE DATA Goal status:  ongoing   05/07/2024  Patient ambulates >500' with LRAD and prostheses independently Baseline: SEE OBJECTIVE DATA Goal status: ongoing    05/07/2024  Patient negotiates ramps, curbs & stairs with single rail with prostheses with LRAD independently. Baseline: SEE OBJECTIVE DATA Goal status: ongoing    05/07/2024  Patient demonstrates & verbalizes lifting, carrying, pushing, pulling with prostheses only safely. Baseline: SEE OBJECTIVE DATA Goal status: ongoing    05/07/2024  6.  Patient reports Patient-Specific Activity Score improved the average >/= 6 to indicate improvement in functional activities.   Baseline: SEE OBJECTIVE DATA Goal status: ongoing   05/07/2024  PLAN:  PT FREQUENCY: 2x/week  PT DURATION: 10 weeks  PLANNED INTERVENTIONS: 97164- PT Re-evaluation, 97750- Physical Performance Testing, 97110-Therapeutic exercises, 97530- Therapeutic activity, [id number]- Neuromuscular re-education, 97535- Self Care, [id number]- Manual therapy, [phone]- Gait training, [phone]- Prosthetic Initial , [phone]- Orthotic/Prosthetic subsequent, Patient/Family education, Balance training, Stair training, Scar mobilization, and DME instructions  PLAN FOR NEXT SESSION:   Continue to work on standing balance with UE resistance bands, static and dynamic balance, ambulation with cane stand-alone  tip and without assistive device. LE strength     Grayce Spatz, PT, DPT 05/09/2024, 2:35 PM    Referring diagnosis? Z89.511 (ICD-10-CM) - Right below-knee amputee (has history left BKA so now Bil. BKAs) Treatment diagnosis? (if different than referring diagnosis) R26.89, M62.81, M25.661, R26.81 What was this (referring dx) caused by? [x]  Surgery []  Fall []  Ongoing issue []  Arthritis []  Other: ____________  Laterality: []  Rt []  Lt [x]  Both  Check all possible CPT codes:  *CHOOSE 10 OR LESS*    See Planned Interventions listed in the Plan section of the Evaluation.

## 2024-05-14 ENCOUNTER — Ambulatory Visit (INDEPENDENT_AMBULATORY_CARE_PROVIDER_SITE_OTHER): Admitting: Physical Therapy

## 2024-05-14 ENCOUNTER — Encounter: Payer: Self-pay | Admitting: Physical Therapy

## 2024-05-14 DIAGNOSIS — R2689 Other abnormalities of gait and mobility: Secondary | ICD-10-CM | POA: Diagnosis not present

## 2024-05-14 DIAGNOSIS — R2681 Unsteadiness on feet: Secondary | ICD-10-CM

## 2024-05-14 DIAGNOSIS — M6281 Muscle weakness (generalized): Secondary | ICD-10-CM

## 2024-05-14 DIAGNOSIS — M25661 Stiffness of right knee, not elsewhere classified: Secondary | ICD-10-CM

## 2024-05-14 NOTE — Therapy (Signed)
 OUTPATIENT PHYSICAL THERAPY PROSTHETIC TREATMENT   Patient Name: Kevin Bradford MRN: [id number] DOB:[birth year], 78 y.o., male Today's Date: 05/14/2024  END OF SESSION:  PT End of Session - 05/14/24 1432     Visit Number 13    Number of Visits 30    Date for Recertification  07/04/24    Authorization Type [employer]    Authorization Time Period Authorization #[id number]  Tracking #[id number]  04/25/2024-07/04/2024  10 PT Visit    Authorization - Visit Number 3    Authorization - Number of Visits 10    Progress Note Due on Visit 10    PT Start Time 1430    PT Stop Time 1514    PT Time Calculation (min) 44 min    Equipment Utilized During Treatment Gait belt    Activity Tolerance Patient tolerated treatment well    Behavior During Therapy WFL for tasks assessed/performed                   Past Medical History:  Diagnosis Date   Arthritis    joint pain (08/16/2017)   Below knee amputation (HCC)    Cellulitis and abscess of leg 10/18/2014   CKD (chronic kidney disease) stage 3, GFR 30-59 ml/min (HCC)    creatinine increases with antibiotics per pt, no known kidney disease per patient   Dehiscence of closure of skin, subsequent encounter    Diabetic foot ulcer (HCC)    left   Diabetic peripheral neuropathy (HCC)    GERD (gastroesophageal reflux disease)    12/26/2017- not this year.    Osteomyelitis (HCC)    left transmetatarsal    Stroke (HCC)    Type II diabetes mellitus [facility])    Past Surgical History:  Procedure Laterality Date   AMPUTATION Left 07/19/2017   Procedure: LEFT TRANSMETATARSAL AMPUTATION;  Surgeon: Harden Jerona GAILS, MD;  Location: [facility] OR;  Service: Orthopedics;  Laterality: Left;   AMPUTATION Left 08/16/2017   REVISION LEFT TRANSMETATARSAL AMPUTATION   AMPUTATION Left 11/22/2017   Procedure: LEFT BELOW KNEE AMPUTATION;  Surgeon: Harden Jerona GAILS, MD;  Location: [facility] OR;  Service: Orthopedics;  Laterality: Left;   AMPUTATION Right 12/20/2023    Procedure: AMPUTATION RIGHT BELOW KNEE;  Surgeon: Harden Jerona GAILS, MD;  Location: [facility] OR;  Service: Orthopedics;  Laterality: Right;   CATARACT EXTRACTION W/ INTRAOCULAR LENS  IMPLANT, BILATERAL  ~ 2016   EYE SURGERY Bilateral    catarct   I & D EXTREMITY Left 10/17/2014   Procedure: IRRIGATION AND DEBRIDEMENT EXTREMITY LEFT FOOT;  Surgeon: Jerona Harden GAILS, MD;  Location: [facility] OR;  Service: Orthopedics;  Laterality: Left;   IR ANGIO INTRA EXTRACRAN SEL COM CAROTID INNOMINATE BILAT MOD SED  05/07/2018   IR ANGIO VERTEBRAL SEL SUBCLAVIAN INNOMINATE BILAT MOD SED  05/07/2018   STUMP REVISION Left 08/16/2017   Procedure: REVISION LEFT TRANSMETATARSAL AMPUTATION;  Surgeon: Harden Jerona GAILS, MD;  Location: [facility] OR;  Service: Orthopedics;  Laterality: Left;   STUMP REVISION Left 12/27/2017   Procedure: LEFT BELOW KNEE AMPUTATION REVISION;  Surgeon: Harden Jerona GAILS, MD;  Location: [facility] OR;  Service: Orthopedics;  Laterality: Left;   TOE AMPUTATION Right 2013   5th toe   Patient Active Problem List   Diagnosis Date Noted   Coping style affecting medical condition 12/29/2023   Malnutrition of moderate degree 12/25/2023   Right below-knee amputee (HCC) 12/25/2023   Diabetic infection of right foot (HCC) 12/16/2023   History of stroke 12/16/2023  Acute osteomyelitis of right calcaneus (HCC) 12/16/2023   Cutaneous abscess of right foot 12/16/2023   DKA, type 2 (HCC) 12/08/2023   Hypokalemia 12/08/2023   S/P BKA (below knee amputation), left (HCC) 12/08/2023   Hyperosmolar hyperglycemic state (HHS) (HCC) 12/07/2023   Stroke (HCC)    Symptomatic bradycardia 05/06/2018   Postural dizziness with near syncope 05/06/2018   Labile blood glucose    Acute blood loss anemia    Labile blood pressure    CKD (chronic kidney disease) stage 2, GFR 60-89 ml/min    Type 2 diabetes mellitus with peripheral neuropathy (HCC)    Post-operative pain    Amputation of left lower extremity below knee upon examination (HCC)  11/24/2017   Amputated below knee, left (HCC) 11/22/2017   Osteomyelitis of left foot (HCC)    Dehiscence of amputation stump (HCC) 08/11/2017   S/P transmetatarsal amputation of foot, left (HCC) 07/19/2017   Subacute osteomyelitis, left ankle and foot (HCC) 07/17/2017   Diabetic ulcer of left foot associated with type 2 diabetes mellitus (HCC)    Diabetes mellitus with renal complications (HCC) 10/17/2014   Diabetes (HCC) 10/13/2014   Normocytic anemia 10/13/2014   Hyperkalemia 10/13/2014    PCP: Ransom Other, MD  REFERRING PROVIDER: Harden Jerona GAILS, MD  ONSET DATE: prosthesis delivery: 03/15/2024  REFERRING DIAG: S10.488 (ICD-10-CM) - Right below-knee amputee (HCC)   THERAPY DIAG:  Other abnormalities of gait and mobility  Muscle weakness (generalized)  Unsteadiness on feet  Stiffness of right knee, not elsewhere classified  Rationale for Evaluation and Treatment: Rehabilitation  SUBJECTIVE:   SUBJECTIVE STATEMENT:  He was able to shoot pool.  He arrived ambulating with single SBQC.  Pt accompanied by: significant other  PERTINENT HISTORY:  Right TTA 12/20/23, L TTA 2019, DM2, CKD3, PVD, arthritis, peripheral neuropathy, CVA,   Patient is a 78 y.o. male who arrives to PT session with new Rt BKA (12/20/2023) and prostheses (first RLE & new LLE) delivery date 03/15/2024. Patient has a history of previous Lt BKA in 2019. Patient reports being able to stand at sink with UE support. Patient has new prosthesis with vacuum pin suction on both LEs which is new system that he is not familiar with. Patient has progressed wear of Rt prosthesis from 1 hour 2x/day up to 3 hrs, 2x/ day with minimal discomfort.   PAIN:  Are you having pain? No  PRECAUTIONS: None  WEIGHT BEARING RESTRICTIONS: No  FALLS: Has patient fallen in last 6 months? No  LIVING ENVIRONMENT: Lives with: lives with their spouse Lives in: House Home Access: Ramped entrance or by garage which is one step Home  layout: Two level, 1/2 bath on main level (not accessible with w/c), and Bed/bath upstairs Stairs: Yes: Internal: 16 steps; on left going up Has following equipment at home: Single point cane, Environmental Consultant - 2 wheeled, Wheelchair (manual), shower chair, and Tour manager  OCCUPATION: retired, works as art therapist on occasion  PLOF: modified independence  PATIENT GOALS: function with both prostheses at community level, doing yard corporate treasurer   MD appt: follow up 04/30/2024   OBJECTIVE:  Patient-Specific Activity Scoring Scheme  0 represents "unable to perform." 10 represents "able to perform at prior level. 0 1 2 3 4 5 6 7 8 9  10 (Date and Score)  Activity Eval  03/20/24  04/25/2024  1.  walking 0   4  2. standing ADLs  0  8  3. drive 0 0  4.stairs  0 7  5. Yardwork/handyman work 0 6  Score 0 5   Total score = sum of the activity scores/number of activities Minimum detectable change (90%CI) for average score = 2 points Minimum detectable change (90%CI) for single activity score = 3 points  COGNITION: Overall cognitive status: Within functional limits for tasks assessed   SENSATION: WFL  POSTURE: rounded shoulders, forward head, flexed trunk , and weight shift left  LOWER EXTREMITY ROM:  ROM P:passive  A:active Right eval Left eval  Hip flexion    Hip extension    Hip abduction    Hip adduction    Hip internal rotation    Hip external rotation    Knee flexion    Knee extension Seated  P: -5* Seated  P: 0*  Ankle dorsiflexion    Ankle plantarflexion    Ankle inversion    Ankle eversion     (Blank rows = not tested)  LOWER EXTREMITY MMT:  MMT Right eval Left eval  Hip flexion    Hip extension 3-/5 4-/5  Hip abduction 3/5 4-/5  Hip adduction    Hip internal rotation    Hip external rotation    Knee flexion 3/5   Knee extension 3/5   At Evaluation all strength testing is grossly seated and functionally standing / gait. (Blank rows = not  tested)  TRANSFERS: Sit to stand: Mod A, dependent on heavy BUE support 18 chair with armrests, use RW to stabilize to stand upright Stand to sit: Min A with BUE support on armrests to 18 chair, requires RW for stability Lateral scoot transfers: SBA with BUE support and 2 chairs touching same height  FUNCTIONAL TESTs:  Pt requires minA with heavy BUE support on RW for static standing balance.   GAIT: Gait pattern: step to pattern, decreased step length- Right, decreased step length- Left, decreased stance time- Right, decreased hip/knee flexion- Right, Right hip hike, knee flexed in stance- Right, lateral hip instability, trunk flexed, and wide BOS Distance walked: 22' Assistive device utilized: Environmental Consultant - 2 wheeled Level of assistance: Mod A   CURRENT PROSTHETIC WEAR ASSESSMENT: 03/20/2024  Patient is dependent with: skin check, residual limb care, prosthetic cleaning, ply sock cleaning, correct ply sock adjustment, proper wear schedule/adjustment, and proper weight-bearing schedule/adjustment Donning prosthesis: Mod A Doffing prosthesis: Mod A Prosthetic wear tolerance: progressed up to 3 hours, 2x/day for last day,  he has worn right prosthesis daily since delivery 6 days ago.  He is wearing Left prosthesis all awake hours.  Prosthetic weight bearing tolerance: pt tolerated standing & gait with RW support for 5 min total with no c/o limb pain.  Edema: RLE pitting with 5-second capillary refill.  None noted in LLE. Residual limb condition:   Right: bone length 7, limb length 8, scar healed, no hair growth, normal skin color and temperature, 1 medial wound 2mm which is clean with no signs of infection, cylindrical shape  Left: conical shape, shrunk with bony landmarks prominent, dark & callused pressure areas at fibula head, tibial tuberosity and lateral tibial plateau.  Prosthetic description: total contact socket with backing pin suction, dynamic response feet    [facility] PT Assessment -  04/25/24 0001       Berg Balance Test   Sit to Stand Able to stand  independently using hands    Standing Unsupported Able to stand 30 seconds unsupported    Sitting with Back Unsupported but Feet Supported on Floor or Stool Able to sit safely and securely 2 minutes  Stand to Sit Controls descent by using hands    Transfers Able to transfer safely, definite need of hands    Standing Unsupported with Eyes Closed Able to stand 10 seconds with supervision    Standing Unsupported with Feet Together Able to place feet together independently but unable to hold for 30 seconds    From Standing, Reach Forward with Outstretched Arm Can reach forward >12 cm safely (5)    From Standing Position, Pick up Object from Floor Able to pick up shoe, needs supervision    From Standing Position, Turn to Look Behind Over each Shoulder Needs supervision when turning    Turn 360 Degrees Needs close supervision or verbal cueing    Standing Unsupported, Alternately Place Feet on Step/Stool Able to complete >2 steps/needs minimal assist    Standing Unsupported, One Foot in Front Able to take small step independently and hold 30 seconds    Standing on One Leg Tries to lift leg/unable to hold 3 seconds but remains standing independently    Total Score 32    Berg comment: intermittent CGA throughout with RW use in between activities           TODAY'S TREATMENT:                                                                                                                             DATE:  05/14/2024 Prosthetic Care with bilateral BKA prostheses: Patient ambulated 100' x 2 with single cane stand-alone tip with supervision.  Verbal cues on upright posture.  Therapeutic activities Floor transfer using BUEs on chair seat via half kneeling 1 rep with each LE forward.  PT demo & verbal cues on garden kneel bench for kneeling and seated activities.  Patient able to perform sit to stand from guard kneel bench  utilizing [facility] for stabilization upon arising.  Patient able to perform kneeling with gardening [employer] on low setting with minA.   Leg press: PT cues on full motion with knee control Back at 45* BLE's 112# 12 reps. Back flat BLE's 112# 12 reps. Back flat BLE's marching to work on single-leg stance stability 112# 12 reps. Back flat single LE 56# 12 reps on each lower extremity Standing with back to doorframe working on upright posture: Holding each for 2 deep breaths trying to stretch upright in the trunk and lower extremities.  Single upper extremity overhead and double upper extremity overhead 2 reps each.  Patient and wife verbalized understanding for this activity to be performed at home with recommendations to try to perform each time he is exiting the bathroom.  Therapeutic exercise Hip flexor stretch supine Thomas position 20 second hold for each lower extremity.   TREATMENT:  DATE:  05/09/2024 Prosthetic Care with bilateral BKA prostheses: Patient ambulated 50' x 2 and 80' x 1 with single cane stand-alone tip with CGA.  Verbal cues on upright posture. Sit to stand from 18 chair without armrests using hands on seat with min/CGA requiring chair to be stabilized.  Patient stabilizes upon arising with cane stand-alone tip.  Neuromuscular reeducation: Working on standing balance, core stability and functional strength for upright posture Standing resistive upper extremity exercises with PT minA for stability and intermittent touch on RW placed anteriorly for safety.  Green Thera-Band single upper extremity alternating and BUEs 10 reps each: Rows, forward reach and biceps curl with upward reach. Resistive gait with red Thera-Band L UE and cane stand-alone tip RUE 3 reps each walking out 5 feet and controlled return.  Resistance anteriorly and resistance posteriorly.   Patient requires min assist. Standing back extension leaning forward under control to place hands on seated with chair and returning to upright.  10 reps with CGA.  Therapeutic activities Leg press: PT cues on full motion with knee control Back at 45* BLE's 100# 12 reps. Back flat BLE's 100# 12 reps. Back flat BLE's marching to work on single-leg stance stability 100# 12 reps. Back flat single LE 50# 12 reps on each lower extremity Standing with back to doorframe working on upright posture: Holding each for 2 deep breaths trying to stretch upright in the trunk and lower extremities.  Single upper extremity overhead and double upper extremity overhead 2 reps each.  Patient and wife verbalized understanding for this activity to be performed at home with recommendations to try to perform each time he is exiting the bathroom.  Therapeutic exercise Hip flexor stretch supine Thomas position 20 second hold for each lower extremity.    TREATMENT:                                                                                                                             DATE:  05/07/2024 Prosthetic Care with bilateral BKA prostheses: PT recommended [facility] fire department signal 3 program for safety in case of an emergency.  Patient and wife verbalized understanding. PT verbally educated on patient in case of a fall that he first needs to make sure nothing has been fractured and then to make sure need a prosthesis that is rotated on the residual limb.  PT demo and verbal cues on floor transfer utilizing chair seat for BUE support via half kneeling.  Patient performed 1 rep kneeling on both the right knee and on the left knee with supervision.  Patient and wife verbalized better understanding.  PT also used Internet to introduce use of a garden kneel bench that may make kneeling activities easier with less pain. Patient ambulated >300' working on gait with single SBQC in each hand and progressed to  carrying water with the free hand.  PT noted no difference with the cane in the right or left hand.  Patient required  intermittent minA with right knee instability. Patient ambulated 150' with cane stand-alone tip working on simple cognitive task with supervision. PT reviewed adjusting ply socks and patient verbalized better understanding. Patient ambulated 25' without an assistive device with minA.     TREATMENT:                                                                                                                             DATE:  04/25/2024 Prosthetic Care with bilateral BKA prostheses: Patient performed BERG balance assessment with supervision>intermittent CGA and intermittent RW use between activities during assessment. Results noted above and discussed with patient and spouse above. PT discussed importance of balance with mobility.  Patient performed gait speed assessment with bilat quad canes and supervision using 4-point technique for a few steps before transitioning to 2-point technique. Patient performed one trial for 20' prior to assessment while verbalizing appropriate 4-point and 2-point technique. Self-selected speed: 1.37 ft/s Fast-as-comfortable speed: 2.0 ft/s  PT discussed results with patient and spouse and discussed why it is important to assess both walking speeds as it translates to community ambulation. Patient and spouse acknowledge.  PT discussing and demonstrating modified 2-point and 2-point technique with unilat quad cane in Lt UE. Patient performed 2x20' with modified 2-point technique and CGA. During first round, patient performed with moderately slow cadence and overextension of Lt UE with cane; PT provided verbal cues throughout first 10' for appropriate modified 2-point technique as well as appropriate placement of cane with patient performing with excellent carryover throughout. During second round, patient performed with quicker gait speed, decreased anxiety  from sensation of imbalance, and improved quad cane placement.  PT discussed with patient about calf soreness/discomfort with patient as he mentioned experiencing tightness at home; however, no tibial pain. PT discussed options for performing isometric contractions for gastroc if he were to experience pain and then had patient perform 1x30s gastroc stretch with each LE on step using treadmill for UE use for balance.  Patient performed descent of stairs within lobby using bilat rails and CGA from PT performing 2 feet to a stair with reciprocal pattern. Patient verbalizing appropriate technique at beginning with patient providing intermittent verbal cues for foot placement and control of descent. Patient transitioned to full reciprocal pattern for second and third set of steps with no noted deficits, though patient endorsing heavy use of bilat UEs.     PATIENT EDUCATION: PATIENT EDUCATED ON FOLLOWING PROSTHETIC CARE: Education details: Alterations required long pants height modification for safety as prosthetist decreased his overall height by approximately 2 inches, shrinker use when prosthesis is off,  Skin check, Residual limb care, Prosthetic cleaning, Correct ply sock adjustment, Propper donning, Proper doffing, Proper wear schedule/adjustment, and Proper weight-bearing schedule/adjustment Prosthetic wear tolerance: 3 hours 2x/day, 7 days/week.  If after 5 days no unusual skin changes or pressure intolerance noted then can increase to 4 hours 2x/day Person educated: Patient and Spouse Education method: Explanation, Demonstration, Tactile cues,  and Verbal cues Education comprehension: verbalized understanding, verbal cues required, tactile cues required, and needs further education  HOME EXERCISE PROGRAM:  ASSESSMENT:  CLINICAL IMPRESSION:  PT worked on functional skills of floor transfers which he improved with instruction.  Patient is improving functional strength with leg press activities.     Patient will continue to benefit from skilled PT.   OBJECTIVE IMPAIRMENTS: Abnormal gait, decreased activity tolerance, decreased balance, decreased coordination, decreased endurance, decreased knowledge of use of DME, decreased mobility, difficulty walking, decreased ROM, decreased strength, impaired flexibility, improper body mechanics, postural dysfunction, and prosthetic dependency .   ACTIVITY LIMITATIONS: carrying, lifting, bending, sitting, standing, squatting, stairs, transfers, bathing, toileting, dressing, reach over head, hygiene/grooming, and locomotion level  PARTICIPATION LIMITATIONS: meal prep, cleaning, driving, shopping, community activity, occupation, and yard work  PERSONAL FACTORS: Age, Fitness, Past/current experiences, Time since onset of injury/illness/exacerbation, and 3+ comorbidities: see PMH are also affecting patient's functional outcome.   REHAB POTENTIAL: Good  CLINICAL DECISION MAKING: Evolving/moderate complexity  EVALUATION COMPLEXITY: Moderate   GOALS: Goals reviewed with patient? Yes  SHORT TERM GOALS: Target date: 04/19/2024  Patient donnes prosthesis modified independent & verbalizes proper cleaning. Baseline: SEE OBJECTIVE DATA Goal status: MET   04/16/2024 2.  Patient tolerates right prosthesis >10 hrs total /day without skin issues or limb pain. Baseline: SEE OBJECTIVE DATA Goal status:  MET 04/16/2024  3.  Patient able to reach 7 and look over both shoulders with RW support with supervision. Baseline: SEE OBJECTIVE DATA Goal status: Met 04/16/2024  4. Patient ambulates 72' with RW & prosthesis with CGA. Baseline: SEE OBJECTIVE DATA Goal status: MET   04/16/2024  5. Patient transfers sit to/from stand 18 chair with armrest to RW with CGA.  Baseline: SEE OBJECTIVE DATA Goal status: MET   04/16/2024  LONG TERM GOALS: Target date: 07/04/2024  Patient demonstrates & verbalized understanding of prosthetic care to enable safe utilization of  prostheses. Baseline: SEE OBJECTIVE DATA Goal status: ongoing    05/14/2024  Patient tolerates prostheses wear >90% of awake hours without skin or limb pain issues. Baseline: SEE OBJECTIVE DATA Goal status: ongoing   05/14/2024  Patient ambulates >500' with LRAD and prostheses independently Baseline: SEE OBJECTIVE DATA Goal status: ongoing    05/14/2024  Patient negotiates ramps, curbs & stairs with single rail with prostheses with LRAD independently. Baseline: SEE OBJECTIVE DATA Goal status: ongoing    05/14/2024  Patient demonstrates & verbalizes lifting, carrying, pushing, pulling with prostheses only safely. Baseline: SEE OBJECTIVE DATA Goal status: ongoing    05/14/2024  6.  Patient reports Patient-Specific Activity Score improved the average >/= 6 to indicate improvement in functional activities.   Baseline: SEE OBJECTIVE DATA Goal status: ongoing   05/14/2024  PLAN:  PT FREQUENCY: 2x/week  PT DURATION: 10 weeks  PLANNED INTERVENTIONS: [id number]- PT Re-evaluation, 97750- Physical Performance Testing, 97110-Therapeutic exercises, 97530- Therapeutic activity, [id number]- Neuromuscular re-education, 97535- Self Care, [id number]- Manual therapy, [phone]- Gait training, [phone]- Prosthetic Initial , [phone]- Orthotic/Prosthetic subsequent, Patient/Family education, Balance training, Stair training, Scar mobilization, and DME instructions  PLAN FOR NEXT SESSION:  Continue to work on standing balance with UE resistance bands, static and dynamic balance, ambulation with cane stand-alone tip and without assistive device. LE strength with leg press.      Grayce Spatz, PT, DPT 05/14/2024, 5:00 PM    Referring diagnosis? Z89.511 (ICD-10-CM) - Right below-knee amputee (has history left BKA so now Bil. BKAs) Treatment diagnosis? (if different than referring diagnosis) R26.89, M62.81, M25.661,  R26.81 What was this (referring dx) caused by? [x]  Surgery []  Fall []  Ongoing issue []  Arthritis []   Other: ____________  Laterality: []  Rt []  Lt [x]  Both  Check all possible CPT codes:  *CHOOSE 10 OR LESS*    See Planned Interventions listed in the Plan section of the Evaluation.

## 2024-05-16 ENCOUNTER — Encounter: Payer: Self-pay | Admitting: Physical Therapy

## 2024-05-16 ENCOUNTER — Ambulatory Visit (INDEPENDENT_AMBULATORY_CARE_PROVIDER_SITE_OTHER): Admitting: Physical Therapy

## 2024-05-16 DIAGNOSIS — M25661 Stiffness of right knee, not elsewhere classified: Secondary | ICD-10-CM | POA: Diagnosis not present

## 2024-05-16 DIAGNOSIS — M6281 Muscle weakness (generalized): Secondary | ICD-10-CM

## 2024-05-16 DIAGNOSIS — R2689 Other abnormalities of gait and mobility: Secondary | ICD-10-CM

## 2024-05-16 DIAGNOSIS — R2681 Unsteadiness on feet: Secondary | ICD-10-CM

## 2024-05-16 NOTE — Therapy (Signed)
 OUTPATIENT PHYSICAL THERAPY PROSTHETIC TREATMENT   Patient Name: Kevin Bradford MRN: [id number] DOB:Mar 24, 1946, 78 y.o., male Today's Date: 05/16/2024  END OF SESSION:  PT End of Session - 05/16/24 1304     Visit Number 14    Number of Visits 30    Date for Recertification  07/04/24    Authorization Type [employer]    Authorization Time Period Authorization #[id number]  Tracking #[id number]  04/25/2024-07/04/2024  10 PT Visit    Authorization - Visit Number 4    Authorization - Number of Visits 10    Progress Note Due on Visit 10    PT Start Time 1300    PT Stop Time 1344    PT Time Calculation (min) 44 min    Equipment Utilized During Treatment Gait belt    Activity Tolerance Patient tolerated treatment well    Behavior During Therapy WFL for tasks assessed/performed                    Past Medical History:  Diagnosis Date   Arthritis    joint pain (08/16/2017)   Below knee amputation (HCC)    Cellulitis and abscess of leg 10/18/2014   CKD (chronic kidney disease) stage 3, GFR 30-59 ml/min (HCC)    creatinine increases with antibiotics per pt, no known kidney disease per patient   Dehiscence of closure of skin, subsequent encounter    Diabetic foot ulcer (HCC)    left   Diabetic peripheral neuropathy (HCC)    GERD (gastroesophageal reflux disease)    12/26/2017- not this year.    Osteomyelitis (HCC)    left transmetatarsal    Stroke (HCC)    Type II diabetes mellitus [facility])    Past Surgical History:  Procedure Laterality Date   AMPUTATION Left 07/19/2017   Procedure: LEFT TRANSMETATARSAL AMPUTATION;  Surgeon: Harden Jerona GAILS, MD;  Location: [facility] OR;  Service: Orthopedics;  Laterality: Left;   AMPUTATION Left 08/16/2017   REVISION LEFT TRANSMETATARSAL AMPUTATION   AMPUTATION Left 11/22/2017   Procedure: LEFT BELOW KNEE AMPUTATION;  Surgeon: Harden Jerona GAILS, MD;  Location: [facility] OR;  Service: Orthopedics;  Laterality: Left;   AMPUTATION Right 12/20/2023    Procedure: AMPUTATION RIGHT BELOW KNEE;  Surgeon: Harden Jerona GAILS, MD;  Location: [facility] OR;  Service: Orthopedics;  Laterality: Right;   CATARACT EXTRACTION W/ INTRAOCULAR LENS  IMPLANT, BILATERAL  ~ 2016   EYE SURGERY Bilateral    catarct   I & D EXTREMITY Left 10/17/2014   Procedure: IRRIGATION AND DEBRIDEMENT EXTREMITY LEFT FOOT;  Surgeon: Jerona Harden GAILS, MD;  Location: [facility] OR;  Service: Orthopedics;  Laterality: Left;   IR ANGIO INTRA EXTRACRAN SEL COM CAROTID INNOMINATE BILAT MOD SED  05/07/2018   IR ANGIO VERTEBRAL SEL SUBCLAVIAN INNOMINATE BILAT MOD SED  05/07/2018   STUMP REVISION Left 08/16/2017   Procedure: REVISION LEFT TRANSMETATARSAL AMPUTATION;  Surgeon: Harden Jerona GAILS, MD;  Location: [facility] OR;  Service: Orthopedics;  Laterality: Left;   STUMP REVISION Left 12/27/2017   Procedure: LEFT BELOW KNEE AMPUTATION REVISION;  Surgeon: Harden Jerona GAILS, MD;  Location: [facility] OR;  Service: Orthopedics;  Laterality: Left;   TOE AMPUTATION Right 2013   5th toe   Patient Active Problem List   Diagnosis Date Noted   Coping style affecting medical condition 12/29/2023   Malnutrition of moderate degree 12/25/2023   Right below-knee amputee (HCC) 12/25/2023   Diabetic infection of right foot (HCC) 12/16/2023   History of stroke 12/16/2023  Acute osteomyelitis of right calcaneus (HCC) 12/16/2023   Cutaneous abscess of right foot 12/16/2023   DKA, type 2 (HCC) 12/08/2023   Hypokalemia 12/08/2023   S/P BKA (below knee amputation), left (HCC) 12/08/2023   Hyperosmolar hyperglycemic state (HHS) (HCC) 12/07/2023   Stroke (HCC)    Symptomatic bradycardia 05/06/2018   Postural dizziness with near syncope 05/06/2018   Labile blood glucose    Acute blood loss anemia    Labile blood pressure    CKD (chronic kidney disease) stage 2, GFR 60-89 ml/min    Type 2 diabetes mellitus with peripheral neuropathy (HCC)    Post-operative pain    Amputation of left lower extremity below knee upon examination (HCC)  11/24/2017   Amputated below knee, left (HCC) 11/22/2017   Osteomyelitis of left foot (HCC)    Dehiscence of amputation stump (HCC) 08/11/2017   S/P transmetatarsal amputation of foot, left (HCC) 07/19/2017   Subacute osteomyelitis, left ankle and foot (HCC) 07/17/2017   Diabetic ulcer of left foot associated with type 2 diabetes mellitus (HCC)    Diabetes mellitus with renal complications (HCC) 10/17/2014   Diabetes (HCC) 10/13/2014   Normocytic anemia 10/13/2014   Hyperkalemia 10/13/2014    PCP: Ransom Other, MD  REFERRING PROVIDER: Harden Jerona GAILS, MD  ONSET DATE: prosthesis delivery: 03/15/2024  REFERRING DIAG: S10.488 (ICD-10-CM) - Right below-knee amputee (HCC)   THERAPY DIAG:  Other abnormalities of gait and mobility  Muscle weakness (generalized)  Unsteadiness on feet  Stiffness of right knee, not elsewhere classified  Rationale for Evaluation and Treatment: Rehabilitation  SUBJECTIVE:   SUBJECTIVE STATEMENT:  He was a little sore and fatigued after last PT session.   Pt accompanied by: significant other  PERTINENT HISTORY:  Right TTA 12/20/23, L TTA 2019, DM2, CKD3, PVD, arthritis, peripheral neuropathy, CVA,   Patient is a 78 y.o. male who arrives to PT session with new Rt BKA (12/20/2023) and prostheses (first RLE & new LLE) delivery date 03/15/2024. Patient has a history of previous Lt BKA in 2019. Patient reports being able to stand at sink with UE support. Patient has new prosthesis with vacuum pin suction on both LEs which is new system that he is not familiar with. Patient has progressed wear of Rt prosthesis from 1 hour 2x/day up to 3 hrs, 2x/ day with minimal discomfort.   PAIN:  Are you having pain? No  PRECAUTIONS: None  WEIGHT BEARING RESTRICTIONS: No  FALLS: Has patient fallen in last 6 months? No  LIVING ENVIRONMENT: Lives with: lives with their spouse Lives in: House Home Access: Ramped entrance or by garage which is one step Home layout: Two  level, 1/2 bath on main level (not accessible with w/c), and Bed/bath upstairs Stairs: Yes: Internal: 16 steps; on left going up Has following equipment at home: Single point cane, Environmental Consultant - 2 wheeled, Wheelchair (manual), shower chair, and Tour manager  OCCUPATION: retired, works as art therapist on occasion  PLOF: modified independence  PATIENT GOALS: function with both prostheses at community level, doing yard corporate treasurer   MD appt: follow up 04/30/2024   OBJECTIVE:  Patient-Specific Activity Scoring Scheme  0 represents "unable to perform." 10 represents "able to perform at prior level. 0 1 2 3 4 5 6 7 8 9  10 (Date and Score)  Activity Eval  03/20/24  04/25/2024  1.  walking 0   4  2. standing ADLs  0  8  3. drive 0 0  4.stairs  0 7  5. Yardwork/handyman work 0 6  Score 0 5   Total score = sum of the activity scores/number of activities Minimum detectable change (90%CI) for average score = 2 points Minimum detectable change (90%CI) for single activity score = 3 points  COGNITION: Overall cognitive status: Within functional limits for tasks assessed   SENSATION: WFL  POSTURE: rounded shoulders, forward head, flexed trunk , and weight shift left  LOWER EXTREMITY ROM:  ROM P:passive  A:active Right eval Left eval  Hip flexion    Hip extension    Hip abduction    Hip adduction    Hip internal rotation    Hip external rotation    Knee flexion    Knee extension Seated  P: -5* Seated  P: 0*  Ankle dorsiflexion    Ankle plantarflexion    Ankle inversion    Ankle eversion     (Blank rows = not tested)  LOWER EXTREMITY MMT:  MMT Right eval Left eval  Hip flexion    Hip extension 3-/5 4-/5  Hip abduction 3/5 4-/5  Hip adduction    Hip internal rotation    Hip external rotation    Knee flexion 3/5   Knee extension 3/5   At Evaluation all strength testing is grossly seated and functionally standing / gait. (Blank rows = not  tested)  TRANSFERS: Sit to stand: Mod A, dependent on heavy BUE support 18 chair with armrests, use RW to stabilize to stand upright Stand to sit: Min A with BUE support on armrests to 18 chair, requires RW for stability Lateral scoot transfers: SBA with BUE support and 2 chairs touching same height  FUNCTIONAL TESTs:  Pt requires minA with heavy BUE support on RW for static standing balance.   GAIT: Gait pattern: step to pattern, decreased step length- Right, decreased step length- Left, decreased stance time- Right, decreased hip/knee flexion- Right, Right hip hike, knee flexed in stance- Right, lateral hip instability, trunk flexed, and wide BOS Distance walked: 22' Assistive device utilized: Environmental Consultant - 2 wheeled Level of assistance: Mod A   CURRENT PROSTHETIC WEAR ASSESSMENT: 03/20/2024  Patient is dependent with: skin check, residual limb care, prosthetic cleaning, ply sock cleaning, correct ply sock adjustment, proper wear schedule/adjustment, and proper weight-bearing schedule/adjustment Donning prosthesis: Mod A Doffing prosthesis: Mod A Prosthetic wear tolerance: progressed up to 3 hours, 2x/day for last day,  he has worn right prosthesis daily since delivery 6 days ago.  He is wearing Left prosthesis all awake hours.  Prosthetic weight bearing tolerance: pt tolerated standing & gait with RW support for 5 min total with no c/o limb pain.  Edema: RLE pitting with 5-second capillary refill.  None noted in LLE. Residual limb condition:   Right: bone length 7, limb length 8, scar healed, no hair growth, normal skin color and temperature, 1 medial wound 2mm which is clean with no signs of infection, cylindrical shape  Left: conical shape, shrunk with bony landmarks prominent, dark & callused pressure areas at fibula head, tibial tuberosity and lateral tibial plateau.  Prosthetic description: total contact socket with backing pin suction, dynamic response feet    [facility] PT Assessment -  04/25/24 0001       Berg Balance Test   Sit to Stand Able to stand  independently using hands    Standing Unsupported Able to stand 30 seconds unsupported    Sitting with Back Unsupported but Feet Supported on Floor or Stool Able to sit safely and securely 2 minutes  Stand to Sit Controls descent by using hands    Transfers Able to transfer safely, definite need of hands    Standing Unsupported with Eyes Closed Able to stand 10 seconds with supervision    Standing Unsupported with Feet Together Able to place feet together independently but unable to hold for 30 seconds    From Standing, Reach Forward with Outstretched Arm Can reach forward >12 cm safely (5)    From Standing Position, Pick up Object from Floor Able to pick up shoe, needs supervision    From Standing Position, Turn to Look Behind Over each Shoulder Needs supervision when turning    Turn 360 Degrees Needs close supervision or verbal cueing    Standing Unsupported, Alternately Place Feet on Step/Stool Able to complete >2 steps/needs minimal assist    Standing Unsupported, One Foot in Front Able to take small step independently and hold 30 seconds    Standing on One Leg Tries to lift leg/unable to hold 3 seconds but remains standing independently    Total Score 32    Berg comment: intermittent CGA throughout with RW use in between activities           TODAY'S TREATMENT:                                                                                                                             DATE:  05/16/2024 Prosthetic Care with bilateral BKA prostheses: Patient ambulated 100' x 2 without AD with CGA.  He needs minA for initial 2-3 steps.  Verbal cues on upright posture.  Therapeutic activities Leg press: PT cues on full motion with knee control Back at 45* BLE's 112# 15 reps. Back flat BLE's 112# 15 reps. Back flat BLE's marching to work on single-leg stance stability 112# 15 reps. Back flat single LE 56# 10 reps  2 sets on each lower extremity  Therapeutic exercise Gastroc stretch (remaining portion of muscle) forefoot on 2 step 30 sec hold 3 reps for each lower extremity. Hamstring stretch long sit with strap DF 30 sec hold 3 reps for each lower extremity. Hip flexor stretch supine Thomas position30 sec hold 3 reps for each lower extremity. PT updated HEP including HO. Pt verbalized understanding after review and performing in clinic.    TREATMENT:  DATE:  05/14/2024 Prosthetic Care with bilateral BKA prostheses: Patient ambulated 100' x 2 with single cane stand-alone tip with supervision.  Verbal cues on upright posture.  Therapeutic activities Floor transfer using BUEs on chair seat via half kneeling 1 rep with each LE forward.  PT demo & verbal cues on garden kneel bench for kneeling and seated activities.  Patient able to perform sit to stand from guard kneel bench utilizing [facility] for stabilization upon arising.  Patient able to perform kneeling with gardening [employer] on low setting with minA.   Leg press: PT cues on full motion with knee control Back at 45* BLE's 112# 12 reps. Back flat BLE's 112# 12 reps. Back flat BLE's marching to work on single-leg stance stability 112# 12 reps. Back flat single LE 56# 12 reps on each lower extremity Standing with back to doorframe working on upright posture: Holding each for 2 deep breaths trying to stretch upright in the trunk and lower extremities.  Single upper extremity overhead and double upper extremity overhead 2 reps each.  Patient and wife verbalized understanding for this activity to be performed at home with recommendations to try to perform each time he is exiting the bathroom.  Therapeutic exercise Hip flexor stretch supine Thomas position 20 second hold for each lower extremity.   TREATMENT:                                                                                                                              DATE:  05/09/2024 Prosthetic Care with bilateral BKA prostheses: Patient ambulated 50' x 2 and 80' x 1 with single cane stand-alone tip with CGA.  Verbal cues on upright posture. Sit to stand from 18 chair without armrests using hands on seat with min/CGA requiring chair to be stabilized.  Patient stabilizes upon arising with cane stand-alone tip.  Neuromuscular reeducation: Working on standing balance, core stability and functional strength for upright posture Standing resistive upper extremity exercises with PT minA for stability and intermittent touch on RW placed anteriorly for safety.  Green Thera-Band single upper extremity alternating and BUEs 10 reps each: Rows, forward reach and biceps curl with upward reach. Resistive gait with red Thera-Band L UE and cane stand-alone tip RUE 3 reps each walking out 5 feet and controlled return.  Resistance anteriorly and resistance posteriorly.  Patient requires min assist. Standing back extension leaning forward under control to place hands on seated with chair and returning to upright.  10 reps with CGA.  Therapeutic activities Leg press: PT cues on full motion with knee control Back at 45* BLE's 100# 12 reps. Back flat BLE's 100# 12 reps. Back flat BLE's marching to work on single-leg stance stability 100# 12 reps. Back flat single LE 50# 12 reps on each lower extremity Standing with back to doorframe working on upright posture: Holding each for 2 deep breaths trying to stretch upright in the trunk and lower extremities.  Single upper extremity  overhead and double upper extremity overhead 2 reps each.  Patient and wife verbalized understanding for this activity to be performed at home with recommendations to try to perform each time he is exiting the bathroom.  Therapeutic exercise Hip flexor stretch supine Thomas position 20 second hold for each lower  extremity.    TREATMENT:                                                                                                                             DATE:  05/07/2024 Prosthetic Care with bilateral BKA prostheses: PT recommended [facility] fire department signal 3 program for safety in case of an emergency.  Patient and wife verbalized understanding. PT verbally educated on patient in case of a fall that he first needs to make sure nothing has been fractured and then to make sure need a prosthesis that is rotated on the residual limb.  PT demo and verbal cues on floor transfer utilizing chair seat for BUE support via half kneeling.  Patient performed 1 rep kneeling on both the right knee and on the left knee with supervision.  Patient and wife verbalized better understanding.  PT also used Internet to introduce use of a garden kneel bench that may make kneeling activities easier with less pain. Patient ambulated >300' working on gait with single SBQC in each hand and progressed to carrying water with the free hand.  PT noted no difference with the cane in the right or left hand.  Patient required intermittent minA with right knee instability. Patient ambulated 150' with cane stand-alone tip working on simple cognitive task with supervision. PT reviewed adjusting ply socks and patient verbalized better understanding. Patient ambulated 25' without an assistive device with minA.      HOME EXERCISE PROGRAM: Access Code: [email or web address] URL: [email or web address] Date: 05/16/2024 Prepared by: Grayce Spatz  Exercises - Upright Stance at Door Frame Single Arm  - 1-3 x daily - 7 x weekly - 1 sets - 2 reps - 2 deep breathes hold - Upright Stance at Door Frame with Both Arms  - 1-3 x daily - 7 x weekly - 1 sets - 2 reps - 2 deep breathes hold - standing calf stretch with forefoot on small step or brick  - 1 x daily - 5-7 x weekly - 1 sets - 3 reps - 30 seconds hold - Seated Table Hamstring  Stretch  - 1 x daily - 5-7 x weekly - 1 sets - 3 reps - 30 seconds hold - Thomas Stretch on Table  - 1 x daily - 5-7 x weekly - 1 sets - 3 reps - 30 seconds hold   ASSESSMENT:  CLINICAL IMPRESSION:  Patient appears to understand HEP for LE stretches and doorframe upright posture.    Patient will continue to benefit from skilled PT.   OBJECTIVE IMPAIRMENTS: Abnormal gait, decreased activity tolerance, decreased balance, decreased coordination, decreased endurance, decreased knowledge of use of DME, decreased mobility, difficulty  walking, decreased ROM, decreased strength, impaired flexibility, improper body mechanics, postural dysfunction, and prosthetic dependency .   ACTIVITY LIMITATIONS: carrying, lifting, bending, sitting, standing, squatting, stairs, transfers, bathing, toileting, dressing, reach over head, hygiene/grooming, and locomotion level  PARTICIPATION LIMITATIONS: meal prep, cleaning, driving, shopping, community activity, occupation, and yard work  PERSONAL FACTORS: Age, Fitness, Past/current experiences, Time since onset of injury/illness/exacerbation, and 3+ comorbidities: see PMH are also affecting patient's functional outcome.   REHAB POTENTIAL: Good  CLINICAL DECISION MAKING: Evolving/moderate complexity  EVALUATION COMPLEXITY: Moderate   GOALS: Goals reviewed with patient? Yes  SHORT TERM GOALS: Target date: 04/19/2024  Patient donnes prosthesis modified independent & verbalizes proper cleaning. Baseline: SEE OBJECTIVE DATA Goal status: MET   04/16/2024 2.  Patient tolerates right prosthesis >10 hrs total /day without skin issues or limb pain. Baseline: SEE OBJECTIVE DATA Goal status:  MET 04/16/2024  3.  Patient able to reach 7 and look over both shoulders with RW support with supervision. Baseline: SEE OBJECTIVE DATA Goal status: Met 04/16/2024  4. Patient ambulates 30' with RW & prosthesis with CGA. Baseline: SEE OBJECTIVE DATA Goal status: MET    04/16/2024  5. Patient transfers sit to/from stand 18 chair with armrest to RW with CGA.  Baseline: SEE OBJECTIVE DATA Goal status: MET   04/16/2024  LONG TERM GOALS: Target date: 07/04/2024  Patient demonstrates & verbalized understanding of prosthetic care to enable safe utilization of prostheses. Baseline: SEE OBJECTIVE DATA Goal status: ongoing    05/14/2024  Patient tolerates prostheses wear >90% of awake hours without skin or limb pain issues. Baseline: SEE OBJECTIVE DATA Goal status: ongoing   05/14/2024  Patient ambulates >500' with LRAD and prostheses independently Baseline: SEE OBJECTIVE DATA Goal status: ongoing    05/14/2024  Patient negotiates ramps, curbs & stairs with single rail with prostheses with LRAD independently. Baseline: SEE OBJECTIVE DATA Goal status: ongoing    05/14/2024  Patient demonstrates & verbalizes lifting, carrying, pushing, pulling with prostheses only safely. Baseline: SEE OBJECTIVE DATA Goal status: ongoing    05/14/2024  6.  Patient reports Patient-Specific Activity Score improved the average >/= 6 to indicate improvement in functional activities.   Baseline: SEE OBJECTIVE DATA Goal status: ongoing   05/14/2024  PLAN:  PT FREQUENCY: 2x/week  PT DURATION: 10 weeks  PLANNED INTERVENTIONS: 97164- PT Re-evaluation, 97750- Physical Performance Testing, 97110-Therapeutic exercises, 97530- Therapeutic activity, [id number]- Neuromuscular re-education, 97535- Self Care, [id number]- Manual therapy, [phone]- Gait training, [phone]- Prosthetic Initial , [phone]- Orthotic/Prosthetic subsequent, Patient/Family education, Balance training, Stair training, Scar mobilization, and DME instructions  PLAN FOR NEXT SESSION:    Continue to work on standing balance with UE resistance bands and add to HEP, static and dynamic balance, ambulation with cane stand-alone tip on ramp/curb and level surfaces without assistive device. LE strength with leg press.      Grayce Spatz,  PT, DPT 05/16/2024, 1:57 PM    Referring diagnosis? Z89.511 (ICD-10-CM) - Right below-knee amputee (has history left BKA so now Bil. BKAs) Treatment diagnosis? (if different than referring diagnosis) R26.89, M62.81, M25.661, R26.81 What was this (referring dx) caused by? [x]  Surgery []  Fall []  Ongoing issue []  Arthritis []  Other: ____________  Laterality: []  Rt []  Lt [x]  Both  Check all possible CPT codes:  *CHOOSE 10 OR LESS*    See Planned Interventions listed in the Plan section of the Evaluation.

## 2024-05-20 ENCOUNTER — Encounter: Payer: Self-pay | Admitting: Radiology

## 2024-05-21 ENCOUNTER — Encounter: Payer: Self-pay | Admitting: Physical Therapy

## 2024-05-21 ENCOUNTER — Ambulatory Visit (INDEPENDENT_AMBULATORY_CARE_PROVIDER_SITE_OTHER): Admitting: Physical Therapy

## 2024-05-21 DIAGNOSIS — M25661 Stiffness of right knee, not elsewhere classified: Secondary | ICD-10-CM

## 2024-05-21 DIAGNOSIS — M6281 Muscle weakness (generalized): Secondary | ICD-10-CM

## 2024-05-21 DIAGNOSIS — R2681 Unsteadiness on feet: Secondary | ICD-10-CM | POA: Diagnosis not present

## 2024-05-21 DIAGNOSIS — R2689 Other abnormalities of gait and mobility: Secondary | ICD-10-CM

## 2024-05-21 NOTE — Therapy (Signed)
 OUTPATIENT PHYSICAL THERAPY PROSTHETIC TREATMENT   Patient Name: Kevin Bradford MRN: [id number] DOB:04/11/1946, 78 y.o., male Today's Date: 05/21/2024  END OF SESSION:  PT End of Session - 05/21/24 1432     Visit Number 15    Number of Visits 30    Date for Recertification  07/04/24    Authorization Type [employer]    Authorization Time Period Authorization #[id number]  Tracking #[id number]  04/25/2024-07/04/2024  10 PT Visit    Authorization - Visit Number 5    Authorization - Number of Visits 10    Progress Note Due on Visit 10    PT Start Time 1430    PT Stop Time 1513    PT Time Calculation (min) 43 min    Equipment Utilized During Treatment Gait belt    Activity Tolerance Patient tolerated treatment well    Behavior During Therapy WFL for tasks assessed/performed                     Past Medical History:  Diagnosis Date   Arthritis    joint pain (08/16/2017)   Below knee amputation (HCC)    Cellulitis and abscess of leg 10/18/2014   CKD (chronic kidney disease) stage 3, GFR 30-59 ml/min (HCC)    creatinine increases with antibiotics per pt, no known kidney disease per patient   Dehiscence of closure of skin, subsequent encounter    Diabetic foot ulcer (HCC)    left   Diabetic peripheral neuropathy (HCC)    GERD (gastroesophageal reflux disease)    12/26/2017- not this year.    Osteomyelitis (HCC)    left transmetatarsal    Stroke (HCC)    Type II diabetes mellitus [facility])    Past Surgical History:  Procedure Laterality Date   AMPUTATION Left 07/19/2017   Procedure: LEFT TRANSMETATARSAL AMPUTATION;  Surgeon: Harden Jerona GAILS, MD;  Location: [facility] OR;  Service: Orthopedics;  Laterality: Left;   AMPUTATION Left 08/16/2017   REVISION LEFT TRANSMETATARSAL AMPUTATION   AMPUTATION Left 11/22/2017   Procedure: LEFT BELOW KNEE AMPUTATION;  Surgeon: Harden Jerona GAILS, MD;  Location: [facility] OR;  Service: Orthopedics;  Laterality: Left;   AMPUTATION Right 12/20/2023    Procedure: AMPUTATION RIGHT BELOW KNEE;  Surgeon: Harden Jerona GAILS, MD;  Location: [facility] OR;  Service: Orthopedics;  Laterality: Right;   CATARACT EXTRACTION W/ INTRAOCULAR LENS  IMPLANT, BILATERAL  ~ 2016   EYE SURGERY Bilateral    catarct   I & D EXTREMITY Left 10/17/2014   Procedure: IRRIGATION AND DEBRIDEMENT EXTREMITY LEFT FOOT;  Surgeon: Jerona Harden GAILS, MD;  Location: [facility] OR;  Service: Orthopedics;  Laterality: Left;   IR ANGIO INTRA EXTRACRAN SEL COM CAROTID INNOMINATE BILAT MOD SED  05/07/2018   IR ANGIO VERTEBRAL SEL SUBCLAVIAN INNOMINATE BILAT MOD SED  05/07/2018   STUMP REVISION Left 08/16/2017   Procedure: REVISION LEFT TRANSMETATARSAL AMPUTATION;  Surgeon: Harden Jerona GAILS, MD;  Location: [facility] OR;  Service: Orthopedics;  Laterality: Left;   STUMP REVISION Left 12/27/2017   Procedure: LEFT BELOW KNEE AMPUTATION REVISION;  Surgeon: Harden Jerona GAILS, MD;  Location: [facility] OR;  Service: Orthopedics;  Laterality: Left;   TOE AMPUTATION Right 2013   5th toe   Patient Active Problem List   Diagnosis Date Noted   Coping style affecting medical condition 12/29/2023   Malnutrition of moderate degree 12/25/2023   Right below-knee amputee (HCC) 12/25/2023   Diabetic infection of right foot (HCC) 12/16/2023   History of stroke  12/16/2023   Acute osteomyelitis of right calcaneus (HCC) 12/16/2023   Cutaneous abscess of right foot 12/16/2023   DKA, type 2 (HCC) 12/08/2023   Hypokalemia 12/08/2023   S/P BKA (below knee amputation), left (HCC) 12/08/2023   Hyperosmolar hyperglycemic state (HHS) (HCC) 12/07/2023   Stroke (HCC)    Symptomatic bradycardia 05/06/2018   Postural dizziness with near syncope 05/06/2018   Labile blood glucose    Acute blood loss anemia    Labile blood pressure    CKD (chronic kidney disease) stage 2, GFR 60-89 ml/min    Type 2 diabetes mellitus with peripheral neuropathy (HCC)    Post-operative pain    Amputation of left lower extremity below knee upon examination (HCC)  11/24/2017   Amputated below knee, left (HCC) 11/22/2017   Osteomyelitis of left foot (HCC)    Dehiscence of amputation stump (HCC) 08/11/2017   S/P transmetatarsal amputation of foot, left (HCC) 07/19/2017   Subacute osteomyelitis, left ankle and foot (HCC) 07/17/2017   Diabetic ulcer of left foot associated with type 2 diabetes mellitus (HCC)    Diabetes mellitus with renal complications (HCC) 10/17/2014   Diabetes (HCC) 10/13/2014   Normocytic anemia 10/13/2014   Hyperkalemia 10/13/2014    PCP: Ransom Other, MD  REFERRING PROVIDER: Harden Jerona GAILS, MD  ONSET DATE: prosthesis delivery: 03/15/2024  REFERRING DIAG: S10.488 (ICD-10-CM) - Right below-knee amputee (HCC)   THERAPY DIAG:  Other abnormalities of gait and mobility  Muscle weakness (generalized)  Unsteadiness on feet  Stiffness of right knee, not elsewhere classified  Rationale for Evaluation and Treatment: Rehabilitation  SUBJECTIVE:   SUBJECTIVE STATEMENT:  He was able to repair sink at restaurant.  He did not have to get on knees.  He has been doing some kneeling at home.    Pt accompanied by: significant other  PERTINENT HISTORY:  Right TTA 12/20/23, L TTA 2019, DM2, CKD3, PVD, arthritis, peripheral neuropathy, CVA,   Patient is a 78 y.o. male who arrives to PT session with new Rt BKA (12/20/2023) and prostheses (first RLE & new LLE) delivery date 03/15/2024. Patient has a history of previous Lt BKA in 2019. Patient reports being able to stand at sink with UE support. Patient has new prosthesis with vacuum pin suction on both LEs which is new system that he is not familiar with. Patient has progressed wear of Rt prosthesis from 1 hour 2x/day up to 3 hrs, 2x/ day with minimal discomfort.   PAIN:  Are you having pain? No  PRECAUTIONS: None  WEIGHT BEARING RESTRICTIONS: No  FALLS: Has patient fallen in last 6 months? No  LIVING ENVIRONMENT: Lives with: lives with their spouse Lives in: House Home Access:  Ramped entrance or by garage which is one step Home layout: Two level, 1/2 bath on main level (not accessible with w/c), and Bed/bath upstairs Stairs: Yes: Internal: 16 steps; on left going up Has following equipment at home: Single point cane, Environmental Consultant - 2 wheeled, Wheelchair (manual), shower chair, and Tour manager  OCCUPATION: retired, works as art therapist on occasion  PLOF: modified independence  PATIENT GOALS: function with both prostheses at community level, doing yard corporate treasurer   MD appt: follow up 04/30/2024   OBJECTIVE:  Patient-Specific Activity Scoring Scheme  0 represents "unable to perform." 10 represents "able to perform at prior level. 0 1 2 3 4 5 6 7 8 9  10 (Date and Score)  Activity Eval  03/20/24  04/25/2024  1.  walking 0   4  2. standing ADLs  0  8  3. drive 0 0  4.stairs  0 7  5. Yardwork/handyman work 0 6  Score 0 5   Total score = sum of the activity scores/number of activities Minimum detectable change (90%CI) for average score = 2 points Minimum detectable change (90%CI) for single activity score = 3 points  COGNITION: Overall cognitive status: Within functional limits for tasks assessed   SENSATION: WFL  POSTURE: rounded shoulders, forward head, flexed trunk , and weight shift left  LOWER EXTREMITY ROM:  ROM P:passive  A:active Right eval Left eval  Hip flexion    Hip extension    Hip abduction    Hip adduction    Hip internal rotation    Hip external rotation    Knee flexion    Knee extension Seated  P: -5* Seated  P: 0*  Ankle dorsiflexion    Ankle plantarflexion    Ankle inversion    Ankle eversion     (Blank rows = not tested)  LOWER EXTREMITY MMT:  MMT Right eval Left eval  Hip flexion    Hip extension 3-/5 4-/5  Hip abduction 3/5 4-/5  Hip adduction    Hip internal rotation    Hip external rotation    Knee flexion 3/5   Knee extension 3/5   At Evaluation all strength testing is grossly seated and  functionally standing / gait. (Blank rows = not tested)  TRANSFERS: Sit to stand: Mod A, dependent on heavy BUE support 18 chair with armrests, use RW to stabilize to stand upright Stand to sit: Min A with BUE support on armrests to 18 chair, requires RW for stability Lateral scoot transfers: SBA with BUE support and 2 chairs touching same height  FUNCTIONAL TESTs:  Pt requires minA with heavy BUE support on RW for static standing balance.   GAIT: Gait pattern: step to pattern, decreased step length- Right, decreased step length- Left, decreased stance time- Right, decreased hip/knee flexion- Right, Right hip hike, knee flexed in stance- Right, lateral hip instability, trunk flexed, and wide BOS Distance walked: 22' Assistive device utilized: Environmental Consultant - 2 wheeled Level of assistance: Mod A   CURRENT PROSTHETIC WEAR ASSESSMENT: 03/20/2024  Patient is dependent with: skin check, residual limb care, prosthetic cleaning, ply sock cleaning, correct ply sock adjustment, proper wear schedule/adjustment, and proper weight-bearing schedule/adjustment Donning prosthesis: Mod A Doffing prosthesis: Mod A Prosthetic wear tolerance: progressed up to 3 hours, 2x/day for last day,  he has worn right prosthesis daily since delivery 6 days ago.  He is wearing Left prosthesis all awake hours.  Prosthetic weight bearing tolerance: pt tolerated standing & gait with RW support for 5 min total with no c/o limb pain.  Edema: RLE pitting with 5-second capillary refill.  None noted in LLE. Residual limb condition:   Right: bone length 7, limb length 8, scar healed, no hair growth, normal skin color and temperature, 1 medial wound 2mm which is clean with no signs of infection, cylindrical shape  Left: conical shape, shrunk with bony landmarks prominent, dark & callused pressure areas at fibula head, tibial tuberosity and lateral tibial plateau.  Prosthetic description: total contact socket with backing pin suction,  dynamic response feet    [facility] PT Assessment - 04/25/24 0001       Berg Balance Test   Sit to Stand Able to stand  independently using hands    Standing Unsupported Able to stand 30 seconds unsupported    Sitting  with Back Unsupported but Feet Supported on Floor or Stool Able to sit safely and securely 2 minutes    Stand to Sit Controls descent by using hands    Transfers Able to transfer safely, definite need of hands    Standing Unsupported with Eyes Closed Able to stand 10 seconds with supervision    Standing Unsupported with Feet Together Able to place feet together independently but unable to hold for 30 seconds    From Standing, Reach Forward with Outstretched Arm Can reach forward >12 cm safely (5)    From Standing Position, Pick up Object from Floor Able to pick up shoe, needs supervision    From Standing Position, Turn to Look Behind Over each Shoulder Needs supervision when turning    Turn 360 Degrees Needs close supervision or verbal cueing    Standing Unsupported, Alternately Place Feet on Step/Stool Able to complete >2 steps/needs minimal assist    Standing Unsupported, One Foot in Front Able to take small step independently and hold 30 seconds    Standing on One Leg Tries to lift leg/unable to hold 3 seconds but remains standing independently    Total Score 32    Berg comment: intermittent CGA throughout with RW use in between activities           TODAY'S TREATMENT:                                                                                                                             DATE:  05/21/2024 Prosthetic Care with bilateral BKA prostheses: Patient performed ascending and descending of 11 stairs using reciprocal pattern with bilat handrails and close SBA-CGA.  PT demonstrated appropriate form of ascending and descending ladder while giving verbal cues throughout. Patient performed ascending and descending of ladder leading with Lt LE up two rungs with CGA and  verbal cues required mostly for rotation of leading LE. Patient attempted to perform leading with Rt LE, though unable to safely ascend more than one rung prior to return to floor.  Ambulating with 10# KB in Rt hand then switch to Lt hand 140' total without device with SBA.   Patient ambulated 100' x 2 without AD with CGA.  He needs minA for initial 2-3 steps.  Verbal cues on upright posture.  Neuro Re-Ed:  Resistive gait for Stabilzation with cable machine minA for balance: weight anterior & posterior 20# (10# per stack) 3 reps;  side stepping with weight to right & to left 10# 3 reps.        TREATMENT:  DATE:  05/16/2024 Prosthetic Care with bilateral BKA prostheses: Patient ambulated 100' x 2 without AD with CGA.  He needs minA for initial 2-3 steps.  Verbal cues on upright posture.  Therapeutic activities Leg press: PT cues on full motion with knee control Back at 45* BLE's 112# 15 reps. Back flat BLE's 112# 15 reps. Back flat BLE's marching to work on single-leg stance stability 112# 15 reps. Back flat single LE 56# 10 reps 2 sets on each lower extremity  Therapeutic exercise Gastroc stretch (remaining portion of muscle) forefoot on 2 step 30 sec hold 3 reps for each lower extremity. Hamstring stretch long sit with strap DF 30 sec hold 3 reps for each lower extremity. Hip flexor stretch supine Thomas position30 sec hold 3 reps for each lower extremity. PT updated HEP including HO. Pt verbalized understanding after review and performing in clinic.    TREATMENT:                                                                                                                             DATE:  05/14/2024 Prosthetic Care with bilateral BKA prostheses: Patient ambulated 100' x 2 with single cane stand-alone tip with supervision.  Verbal cues on upright  posture.  Therapeutic activities Floor transfer using BUEs on chair seat via half kneeling 1 rep with each LE forward.  PT demo & verbal cues on garden kneel bench for kneeling and seated activities.  Patient able to perform sit to stand from guard kneel bench utilizing [facility] for stabilization upon arising.  Patient able to perform kneeling with gardening [employer] on low setting with minA.   Leg press: PT cues on full motion with knee control Back at 45* BLE's 112# 12 reps. Back flat BLE's 112# 12 reps. Back flat BLE's marching to work on single-leg stance stability 112# 12 reps. Back flat single LE 56# 12 reps on each lower extremity Standing with back to doorframe working on upright posture: Holding each for 2 deep breaths trying to stretch upright in the trunk and lower extremities.  Single upper extremity overhead and double upper extremity overhead 2 reps each.  Patient and wife verbalized understanding for this activity to be performed at home with recommendations to try to perform each time he is exiting the bathroom.  Therapeutic exercise Hip flexor stretch supine Thomas position 20 second hold for each lower extremity.   TREATMENT:  DATE:  05/09/2024 Prosthetic Care with bilateral BKA prostheses: Patient ambulated 50' x 2 and 80' x 1 with single cane stand-alone tip with CGA.  Verbal cues on upright posture. Sit to stand from 18 chair without armrests using hands on seat with min/CGA requiring chair to be stabilized.  Patient stabilizes upon arising with cane stand-alone tip.  Neuromuscular reeducation: Working on standing balance, core stability and functional strength for upright posture Standing resistive upper extremity exercises with PT minA for stability and intermittent touch on RW placed anteriorly for safety.  Green Thera-Band single upper extremity  alternating and BUEs 10 reps each: Rows, forward reach and biceps curl with upward reach. Resistive gait with red Thera-Band L UE and cane stand-alone tip RUE 3 reps each walking out 5 feet and controlled return.  Resistance anteriorly and resistance posteriorly.  Patient requires min assist. Standing back extension leaning forward under control to place hands on seated with chair and returning to upright.  10 reps with CGA.  Therapeutic activities Leg press: PT cues on full motion with knee control Back at 45* BLE's 100# 12 reps. Back flat BLE's 100# 12 reps. Back flat BLE's marching to work on single-leg stance stability 100# 12 reps. Back flat single LE 50# 12 reps on each lower extremity Standing with back to doorframe working on upright posture: Holding each for 2 deep breaths trying to stretch upright in the trunk and lower extremities.  Single upper extremity overhead and double upper extremity overhead 2 reps each.  Patient and wife verbalized understanding for this activity to be performed at home with recommendations to try to perform each time he is exiting the bathroom.  Therapeutic exercise Hip flexor stretch supine Thomas position 20 second hold for each lower extremity.     HOME EXERCISE PROGRAM: Access Code: [email or web address] URL: [email or web address] Date: 05/16/2024 Prepared by: Grayce Spatz  Exercises - Upright Stance at Door Frame Single Arm  - 1-3 x daily - 7 x weekly - 1 sets - 2 reps - 2 deep breathes hold - Upright Stance at Door Frame with Both Arms  - 1-3 x daily - 7 x weekly - 1 sets - 2 reps - 2 deep breathes hold - standing calf stretch with forefoot on small step or brick  - 1 x daily - 5-7 x weekly - 1 sets - 3 reps - 30 seconds hold - Seated Table Hamstring Stretch  - 1 x daily - 5-7 x weekly - 1 sets - 3 reps - 30 seconds hold - Thomas Stretch on Table  - 1 x daily - 5-7 x weekly - 1 sets - 3 reps - 30 seconds hold   ASSESSMENT:  CLINICAL  IMPRESSION:  Patient was challenged by resistive gait which facilitates core stabilization, power in BLEs and balance.  Patient has general understanding of climbing a ladder but needs to perform in PT 1-2 more times prior to trying outside of PT.     Patient will continue to benefit from skilled PT.   OBJECTIVE IMPAIRMENTS: Abnormal gait, decreased activity tolerance, decreased balance, decreased coordination, decreased endurance, decreased knowledge of use of DME, decreased mobility, difficulty walking, decreased ROM, decreased strength, impaired flexibility, improper body mechanics, postural dysfunction, and prosthetic dependency .   ACTIVITY LIMITATIONS: carrying, lifting, bending, sitting, standing, squatting, stairs, transfers, bathing, toileting, dressing, reach over head, hygiene/grooming, and locomotion level  PARTICIPATION LIMITATIONS: meal prep, cleaning, driving, shopping, community activity, occupation, and yard work  PERSONAL FACTORS: Age, Fitness, Past/current experiences, Time  since onset of injury/illness/exacerbation, and 3+ comorbidities: see PMH are also affecting patient's functional outcome.   REHAB POTENTIAL: Good  CLINICAL DECISION MAKING: Evolving/moderate complexity  EVALUATION COMPLEXITY: Moderate   GOALS: Goals reviewed with patient? Yes  SHORT TERM GOALS: Target date: 04/19/2024  Patient donnes prosthesis modified independent & verbalizes proper cleaning. Baseline: SEE OBJECTIVE DATA Goal status: MET   04/16/2024 2.  Patient tolerates right prosthesis >10 hrs total /day without skin issues or limb pain. Baseline: SEE OBJECTIVE DATA Goal status:  MET 04/16/2024  3.  Patient able to reach 7 and look over both shoulders with RW support with supervision. Baseline: SEE OBJECTIVE DATA Goal status: Met 04/16/2024  4. Patient ambulates 72' with RW & prosthesis with CGA. Baseline: SEE OBJECTIVE DATA Goal status: MET   04/16/2024  5. Patient transfers sit to/from  stand 18 chair with armrest to RW with CGA.  Baseline: SEE OBJECTIVE DATA Goal status: MET   04/16/2024  LONG TERM GOALS: Target date: 07/04/2024  Patient demonstrates & verbalized understanding of prosthetic care to enable safe utilization of prostheses. Baseline: SEE OBJECTIVE DATA Goal status: ongoing     05/21/2024  Patient tolerates prostheses wear >90% of awake hours without skin or limb pain issues. Baseline: SEE OBJECTIVE DATA Goal status: ongoing     05/21/2024  Patient ambulates >500' with LRAD and prostheses independently Baseline: SEE OBJECTIVE DATA Goal status: ongoing     05/21/2024  Patient negotiates ramps, curbs & stairs with single rail with prostheses with LRAD independently. Baseline: SEE OBJECTIVE DATA Goal status: ongoing     05/21/2024  Patient demonstrates & verbalizes lifting, carrying, pushing, pulling with prostheses only safely. Baseline: SEE OBJECTIVE DATA Goal status: ongoing   05/21/2024  6.  Patient reports Patient-Specific Activity Score improved the average >/= 6 to indicate improvement in functional activities.   Baseline: SEE OBJECTIVE DATA Goal status: ongoing   05/21/2024  PLAN:  PT FREQUENCY: 2x/week  PT DURATION: 10 weeks  PLANNED INTERVENTIONS: 97164- PT Re-evaluation, 97750- Physical Performance Testing, 97110-Therapeutic exercises, 97530- Therapeutic activity, [id number]- Neuromuscular re-education, 97535- Self Care, [id number]- Manual therapy, [phone]- Gait training, [phone]- Prosthetic Initial , [phone]- Orthotic/Prosthetic subsequent, Patient/Family education, Balance training, Stair training, Scar mobilization, and DME instructions  PLAN FOR NEXT SESSION:    Continue to work on standing balance with resistive gait, static and dynamic balance, ambulation with cane stand-alone tip on ramp/curb and level surfaces without assistive device. LE strength with leg press.      Grayce Spatz, PT, DPT 05/21/2024, 3:31 PM    Referring diagnosis? Z89.511  (ICD-10-CM) - Right below-knee amputee (has history left BKA so now Bil. BKAs) Treatment diagnosis? (if different than referring diagnosis) R26.89, M62.81, M25.661, R26.81 What was this (referring dx) caused by? [x]  Surgery []  Fall []  Ongoing issue []  Arthritis []  Other: ____________  Laterality: []  Rt []  Lt [x]  Both  Check all possible CPT codes:  *CHOOSE 10 OR LESS*    See Planned Interventions listed in the Plan section of the Evaluation.

## 2024-05-22 NOTE — Therapy (Signed)
 OUTPATIENT PHYSICAL THERAPY PROSTHETIC TREATMENT   Patient Name: Kevin Bradford MRN: [id number] DOB:September 22, 1945, 78 y.o., male Today's Date: 05/23/2024  END OF SESSION:  PT End of Session - 05/23/24 1407     Visit Number 16    Number of Visits 30    Date for Recertification  07/04/24    Authorization Type [employer]    Authorization Time Period Authorization #[id number]  Tracking #[id number]  04/25/2024-07/04/2024  10 PT Visit    Authorization - Visit Number 6    Authorization - Number of Visits 10    Progress Note Due on Visit 10    PT Start Time 1432    PT Stop Time 1513    PT Time Calculation (min) 41 min    Equipment Utilized During Treatment Gait belt    Activity Tolerance Patient tolerated treatment well    Behavior During Therapy WFL for tasks assessed/performed              Past Medical History:  Diagnosis Date   Arthritis    joint pain (08/16/2017)   Below knee amputation (HCC)    Cellulitis and abscess of leg 10/18/2014   CKD (chronic kidney disease) stage 3, GFR 30-59 ml/min (HCC)    creatinine increases with antibiotics per pt, no known kidney disease per patient   Dehiscence of closure of skin, subsequent encounter    Diabetic foot ulcer (HCC)    left   Diabetic peripheral neuropathy (HCC)    GERD (gastroesophageal reflux disease)    12/26/2017- not this year.    Osteomyelitis (HCC)    left transmetatarsal    Stroke (HCC)    Type II diabetes mellitus [facility])    Past Surgical History:  Procedure Laterality Date   AMPUTATION Left 07/19/2017   Procedure: LEFT TRANSMETATARSAL AMPUTATION;  Surgeon: Harden Jerona GAILS, MD;  Location: [facility] OR;  Service: Orthopedics;  Laterality: Left;   AMPUTATION Left 08/16/2017   REVISION LEFT TRANSMETATARSAL AMPUTATION   AMPUTATION Left 11/22/2017   Procedure: LEFT BELOW KNEE AMPUTATION;  Surgeon: Harden Jerona GAILS, MD;  Location: [facility] OR;  Service: Orthopedics;  Laterality: Left;   AMPUTATION Right 12/20/2023   Procedure:  AMPUTATION RIGHT BELOW KNEE;  Surgeon: Harden Jerona GAILS, MD;  Location: [facility] OR;  Service: Orthopedics;  Laterality: Right;   CATARACT EXTRACTION W/ INTRAOCULAR LENS  IMPLANT, BILATERAL  ~ 2016   EYE SURGERY Bilateral    catarct   I & D EXTREMITY Left 10/17/2014   Procedure: IRRIGATION AND DEBRIDEMENT EXTREMITY LEFT FOOT;  Surgeon: Jerona Harden GAILS, MD;  Location: [facility] OR;  Service: Orthopedics;  Laterality: Left;   IR ANGIO INTRA EXTRACRAN SEL COM CAROTID INNOMINATE BILAT MOD SED  05/07/2018   IR ANGIO VERTEBRAL SEL SUBCLAVIAN INNOMINATE BILAT MOD SED  05/07/2018   STUMP REVISION Left 08/16/2017   Procedure: REVISION LEFT TRANSMETATARSAL AMPUTATION;  Surgeon: Harden Jerona GAILS, MD;  Location: [facility] OR;  Service: Orthopedics;  Laterality: Left;   STUMP REVISION Left 12/27/2017   Procedure: LEFT BELOW KNEE AMPUTATION REVISION;  Surgeon: Harden Jerona GAILS, MD;  Location: [facility] OR;  Service: Orthopedics;  Laterality: Left;   TOE AMPUTATION Right 2013   5th toe   Patient Active Problem List   Diagnosis Date Noted   Coping style affecting medical condition 12/29/2023   Malnutrition of moderate degree 12/25/2023   Right below-knee amputee (HCC) 12/25/2023   Diabetic infection of right foot (HCC) 12/16/2023   History of stroke 12/16/2023   Acute osteomyelitis of right  calcaneus (HCC) 12/16/2023   Cutaneous abscess of right foot 12/16/2023   DKA, type 2 (HCC) 12/08/2023   Hypokalemia 12/08/2023   S/P BKA (below knee amputation), left (HCC) 12/08/2023   Hyperosmolar hyperglycemic state (HHS) (HCC) 12/07/2023   Stroke (HCC)    Symptomatic bradycardia 05/06/2018   Postural dizziness with near syncope 05/06/2018   Labile blood glucose    Acute blood loss anemia    Labile blood pressure    CKD (chronic kidney disease) stage 2, GFR 60-89 ml/min    Type 2 diabetes mellitus with peripheral neuropathy (HCC)    Post-operative pain    Amputation of left lower extremity below knee upon examination (HCC) 11/24/2017    Amputated below knee, left (HCC) 11/22/2017   Osteomyelitis of left foot (HCC)    Dehiscence of amputation stump (HCC) 08/11/2017   S/P transmetatarsal amputation of foot, left (HCC) 07/19/2017   Subacute osteomyelitis, left ankle and foot (HCC) 07/17/2017   Diabetic ulcer of left foot associated with type 2 diabetes mellitus (HCC)    Diabetes mellitus with renal complications (HCC) 10/17/2014   Diabetes (HCC) 10/13/2014   Normocytic anemia 10/13/2014   Hyperkalemia 10/13/2014    PCP: Ransom Other, MD  REFERRING PROVIDER: Harden Jerona GAILS, MD  ONSET DATE: prosthesis delivery: 03/15/2024  REFERRING DIAG: S10.488 (ICD-10-CM) - Right below-knee amputee (HCC)   THERAPY DIAG:  Other abnormalities of gait and mobility  Muscle weakness (generalized)  Unsteadiness on feet  Stiffness of right knee, not elsewhere classified  Rationale for Evaluation and Treatment: Rehabilitation  SUBJECTIVE:   SUBJECTIVE STATEMENT:  Patient has made an appointment with prosthetist on November 17 to adjust alignment.  Pt accompanied by: significant other  PERTINENT HISTORY:  Right TTA 12/20/23, L TTA 2019, DM2, CKD3, PVD, arthritis, peripheral neuropathy, CVA,   Patient is a 78 y.o. male who arrives to PT session with new Rt BKA (12/20/2023) and prostheses (first RLE & new LLE) delivery date 03/15/2024. Patient has a history of previous Lt BKA in 2019. Patient reports being able to stand at sink with UE support. Patient has new prosthesis with vacuum pin suction on both LEs which is new system that he is not familiar with. Patient has progressed wear of Rt prosthesis from 1 hour 2x/day up to 3 hrs, 2x/ day with minimal discomfort.   PAIN:  Are you having pain? No  PRECAUTIONS: None  WEIGHT BEARING RESTRICTIONS: No  FALLS: Has patient fallen in last 6 months? No  LIVING ENVIRONMENT: Lives with: lives with their spouse Lives in: House Home Access: Ramped entrance or by garage which is one  step Home layout: Two level, 1/2 bath on main level (not accessible with w/c), and Bed/bath upstairs Stairs: Yes: Internal: 16 steps; on left going up Has following equipment at home: Single point cane, Environmental Consultant - 2 wheeled, Wheelchair (manual), shower chair, and Tour manager  OCCUPATION: retired, works as art therapist on occasion  PLOF: modified independence  PATIENT GOALS: function with both prostheses at community level, doing yard corporate treasurer   MD appt: follow up 04/30/2024   OBJECTIVE:  Patient-Specific Activity Scoring Scheme  0 represents "unable to perform." 10 represents "able to perform at prior level. 0 1 2 3 4 5 6 7 8 9  10 (Date and Score)  Activity Eval  03/20/24  04/25/2024  1.  walking 0   4  2. standing ADLs  0  8  3. drive 0 0  4.stairs  0 7  5. [place] work  0 6  Score 0 5   Total score = sum of the activity scores/number of activities Minimum detectable change (90%CI) for average score = 2 points Minimum detectable change (90%CI) for single activity score = 3 points  COGNITION: Overall cognitive status: Within functional limits for tasks assessed   SENSATION: WFL  POSTURE: rounded shoulders, forward head, flexed trunk , and weight shift left  LOWER EXTREMITY ROM:  ROM P:passive  A:active Right eval Left eval  Hip flexion    Hip extension    Hip abduction    Hip adduction    Hip internal rotation    Hip external rotation    Knee flexion    Knee extension Seated  P: -5* Seated  P: 0*  Ankle dorsiflexion    Ankle plantarflexion    Ankle inversion    Ankle eversion     (Blank rows = not tested)  LOWER EXTREMITY MMT:  MMT Right eval Left eval  Hip flexion    Hip extension 3-/5 4-/5  Hip abduction 3/5 4-/5  Hip adduction    Hip internal rotation    Hip external rotation    Knee flexion 3/5   Knee extension 3/5   At Evaluation all strength testing is grossly seated and functionally standing / gait. (Blank  rows = not tested)  TRANSFERS: Sit to stand: Mod A, dependent on heavy BUE support 18 chair with armrests, use RW to stabilize to stand upright Stand to sit: Min A with BUE support on armrests to 18 chair, requires RW for stability Lateral scoot transfers: SBA with BUE support and 2 chairs touching same height  FUNCTIONAL TESTs:  Pt requires minA with heavy BUE support on RW for static standing balance.   GAIT: Gait pattern: step to pattern, decreased step length- Right, decreased step length- Left, decreased stance time- Right, decreased hip/knee flexion- Right, Right hip hike, knee flexed in stance- Right, lateral hip instability, trunk flexed, and wide BOS Distance walked: 22' Assistive device utilized: Environmental Consultant - 2 wheeled Level of assistance: Mod A   CURRENT PROSTHETIC WEAR ASSESSMENT: 03/20/2024  Patient is dependent with: skin check, residual limb care, prosthetic cleaning, ply sock cleaning, correct ply sock adjustment, proper wear schedule/adjustment, and proper weight-bearing schedule/adjustment Donning prosthesis: Mod A Doffing prosthesis: Mod A Prosthetic wear tolerance: progressed up to 3 hours, 2x/day for last day,  he has worn right prosthesis daily since delivery 6 days ago.  He is wearing Left prosthesis all awake hours.  Prosthetic weight bearing tolerance: pt tolerated standing & gait with RW support for 5 min total with no c/o limb pain.  Edema: RLE pitting with 5-second capillary refill.  None noted in LLE. Residual limb condition:   Right: bone length 7, limb length 8, scar healed, no hair growth, normal skin color and temperature, 1 medial wound 2mm which is clean with no signs of infection, cylindrical shape  Left: conical shape, shrunk with bony landmarks prominent, dark & callused pressure areas at fibula head, tibial tuberosity and lateral tibial plateau.  Prosthetic description: total contact socket with backing pin suction, dynamic response feet    [facility] PT  Assessment - 04/25/24 0001       Berg Balance Test   Sit to Stand Able to stand  independently using hands    Standing Unsupported Able to stand 30 seconds unsupported    Sitting with Back Unsupported but Feet Supported on Floor or Stool Able to sit safely and securely 2 minutes  Stand to Sit Controls descent by using hands    Transfers Able to transfer safely, definite need of hands    Standing Unsupported with Eyes Closed Able to stand 10 seconds with supervision    Standing Unsupported with Feet Together Able to place feet together independently but unable to hold for 30 seconds    From Standing, Reach Forward with Outstretched Arm Can reach forward >12 cm safely (5)    From Standing Position, Pick up Object from Floor Able to pick up shoe, needs supervision    From Standing Position, Turn to Look Behind Over each Shoulder Needs supervision when turning    Turn 360 Degrees Needs close supervision or verbal cueing    Standing Unsupported, Alternately Place Feet on Step/Stool Able to complete >2 steps/needs minimal assist    Standing Unsupported, One Foot in Front Able to take small step independently and hold 30 seconds    Standing on One Leg Tries to lift leg/unable to hold 3 seconds but remains standing independently    Total Score 32    Berg comment: intermittent CGA throughout with RW use in between activities           TODAY'S TREATMENT:                                                                                                                             DATE:  05/23/2024 Prosthetic Care with bilateral BKA prostheses: PT educated and discussed proper alignment of prosthesis and how improper alignment can lead to increased gait deviation, increased imbalance, and potentially lead to orthopedic pain unrelated to residual limb. Patient and spouse acknowledge, understand, and endorse that they have a follow up with the prosthetist on June 03, 2024.   Neuro Re-Ed:  PT  demo step over and back with small hurdle within parallel bars in order to challenge appropriate weight shift and challenge single leg balance. Patient performed 20 step over and back with unilat UE use and SBA  with intermittent toe/heel catch when stepping over and back with Rt LE. Patient then attempted to perform step over and back without UE use and close CGA, though unable secondary to lack of confidence, stability, and fear of falling. PT then discontinued use of small hurdle and put 4 step in front of patient to perform step taps. Patient performed 1x10 with unilat UE use with SBA. Patient then attempted to perform without UE use and could perform 1 step tap with each leg with use of UE secondary to same conditions as earlier. PT then discontinued use of step and attempted to have patient perform weight shifts while stepping fwd/back with one LE, though patient required seated rest break to discuss difficulty of performing each task without the use of an AD.  PT then had patient perform pre-gait in front of chair without UE use while PT had arm in belt providing tactile weight shift onto Lt LE. Patient unable to perform secondary to  fear of falling and imbalance.  Patient then ambulated 115' without AD and CGA while PT intermittently called out direction changes (fwd/back) or stop. PT slowly transitioned to direction changes without stopping between each change in order to challenge reaction time and reactionary balance.    TODAY'S TREATMENT:                                                                                                                             DATE:  05/21/2024 Prosthetic Care with bilateral BKA prostheses: Patient performed ascending and descending of 11 stairs using reciprocal pattern with bilat handrails and close SBA-CGA.  PT demonstrated appropriate form of ascending and descending ladder while giving verbal cues throughout. Patient performed ascending and descending of  ladder leading with Lt LE up two rungs with CGA and verbal cues required mostly for rotation of leading LE. Patient attempted to perform leading with Rt LE, though unable to safely ascend more than one rung prior to return to floor.  Ambulating with 10# KB in Rt hand then switch to Lt hand 140' total without device with SBA.   Patient ambulated 100' x 2 without AD with CGA.  He needs minA for initial 2-3 steps.  Verbal cues on upright posture.  Neuro Re-Ed:  Resistive gait for Stabilzation with cable machine minA for balance: weight anterior & posterior 20# (10# per stack) 3 reps;  side stepping with weight to right & to left 10# 3 reps.        TREATMENT:                                                                                                                             DATE:  05/16/2024 Prosthetic Care with bilateral BKA prostheses: Patient ambulated 100' x 2 without AD with CGA.  He needs minA for initial 2-3 steps.  Verbal cues on upright posture.  Therapeutic activities Leg press: PT cues on full motion with knee control Back at 45* BLE's 112# 15 reps. Back flat BLE's 112# 15 reps. Back flat BLE's marching to work on single-leg stance stability 112# 15 reps. Back flat single LE 56# 10 reps 2 sets on each lower extremity  Therapeutic exercise Gastroc stretch (remaining portion of muscle) forefoot on 2 step 30 sec hold 3 reps for each lower extremity. Hamstring stretch long sit with strap DF 30 sec hold 3 reps for each lower extremity. Hip flexor  stretch supine Thomas position30 sec hold 3 reps for each lower extremity. PT updated HEP including HO. Pt verbalized understanding after review and performing in clinic.    TREATMENT:                                                                                                                             DATE:  05/14/2024 Prosthetic Care with bilateral BKA prostheses: Patient ambulated 100' x 2 with single cane stand-alone tip  with supervision.  Verbal cues on upright posture.  Therapeutic activities Floor transfer using BUEs on chair seat via half kneeling 1 rep with each LE forward.  PT demo & verbal cues on garden kneel bench for kneeling and seated activities.  Patient able to perform sit to stand from guard kneel bench utilizing [facility] for stabilization upon arising.  Patient able to perform kneeling with gardening [employer] on low setting with minA.   Leg press: PT cues on full motion with knee control Back at 45* BLE's 112# 12 reps. Back flat BLE's 112# 12 reps. Back flat BLE's marching to work on single-leg stance stability 112# 12 reps. Back flat single LE 56# 12 reps on each lower extremity Standing with back to doorframe working on upright posture: Holding each for 2 deep breaths trying to stretch upright in the trunk and lower extremities.  Single upper extremity overhead and double upper extremity overhead 2 reps each.  Patient and wife verbalized understanding for this activity to be performed at home with recommendations to try to perform each time he is exiting the bathroom.  Therapeutic exercise Hip flexor stretch supine Thomas position 20 second hold for each lower extremity.     HOME EXERCISE PROGRAM: Access Code: [email or web address] URL: [email or web address] Date: 05/16/2024 Prepared by: Grayce Spatz  Exercises - Upright Stance at Door Frame Single Arm  - 1-3 x daily - 7 x weekly - 1 sets - 2 reps - 2 deep breathes hold - Upright Stance at Door Frame with Both Arms  - 1-3 x daily - 7 x weekly - 1 sets - 2 reps - 2 deep breathes hold - standing calf stretch with forefoot on small step or brick  - 1 x daily - 5-7 x weekly - 1 sets - 3 reps - 30 seconds hold - Seated Table Hamstring Stretch  - 1 x daily - 5-7 x weekly - 1 sets - 3 reps - 30 seconds hold - Thomas Stretch on Table  - 1 x daily - 5-7 x weekly - 1 sets - 3 reps - 30 seconds hold   ASSESSMENT:  CLINICAL IMPRESSION:  Patient  arrived to session noting currently scheduled for follow up with prosthetist and no pain/soreness in residual limb(s). Patient endorsed only major goal is to be 110% of what he used to be prior to most recent amputation. Patient tolerated all activities this date, however, had difficulty with breakdown of tasks into weightshifts secondary to confidence and imbalance. Patient  will continue to benefit from skilled PT.   OBJECTIVE IMPAIRMENTS: Abnormal gait, decreased activity tolerance, decreased balance, decreased coordination, decreased endurance, decreased knowledge of use of DME, decreased mobility, difficulty walking, decreased ROM, decreased strength, impaired flexibility, improper body mechanics, postural dysfunction, and prosthetic dependency .   ACTIVITY LIMITATIONS: carrying, lifting, bending, sitting, standing, squatting, stairs, transfers, bathing, toileting, dressing, reach over head, hygiene/grooming, and locomotion level  PARTICIPATION LIMITATIONS: meal prep, cleaning, driving, shopping, community activity, occupation, and yard work  PERSONAL FACTORS: Age, Fitness, Past/current experiences, Time since onset of injury/illness/exacerbation, and 3+ comorbidities: see PMH are also affecting patient's functional outcome.   REHAB POTENTIAL: Good  CLINICAL DECISION MAKING: Evolving/moderate complexity  EVALUATION COMPLEXITY: Moderate   GOALS: Goals reviewed with patient? Yes  SHORT TERM GOALS: Target date: 04/19/2024  Patient donnes prosthesis modified independent & verbalizes proper cleaning. Baseline: SEE OBJECTIVE DATA Goal status: MET   04/16/2024 2.  Patient tolerates right prosthesis >10 hrs total /day without skin issues or limb pain. Baseline: SEE OBJECTIVE DATA Goal status:  MET 04/16/2024  3.  Patient able to reach 7 and look over both shoulders with RW support with supervision. Baseline: SEE OBJECTIVE DATA Goal status: Met 04/16/2024  4. Patient ambulates 32' with RW &  prosthesis with CGA. Baseline: SEE OBJECTIVE DATA Goal status: MET   04/16/2024  5. Patient transfers sit to/from stand 18 chair with armrest to RW with CGA.  Baseline: SEE OBJECTIVE DATA Goal status: MET   04/16/2024  LONG TERM GOALS: Target date: 07/04/2024  Patient demonstrates & verbalized understanding of prosthetic care to enable safe utilization of prostheses. Baseline: SEE OBJECTIVE DATA Goal status: ongoing     05/21/2024  Patient tolerates prostheses wear >90% of awake hours without skin or limb pain issues. Baseline: SEE OBJECTIVE DATA Goal status: ongoing     05/21/2024  Patient ambulates >500' with LRAD and prostheses independently Baseline: SEE OBJECTIVE DATA Goal status: ongoing     05/21/2024  Patient negotiates ramps, curbs & stairs with single rail with prostheses with LRAD independently. Baseline: SEE OBJECTIVE DATA Goal status: ongoing     05/21/2024  Patient demonstrates & verbalizes lifting, carrying, pushing, pulling with prostheses only safely. Baseline: SEE OBJECTIVE DATA Goal status: ongoing   05/21/2024  6.  Patient reports Patient-Specific Activity Score improved the average >/= 6 to indicate improvement in functional activities.   Baseline: SEE OBJECTIVE DATA Goal status: ongoing   05/21/2024  PLAN:  PT FREQUENCY: 2x/week  PT DURATION: 10 weeks  PLANNED INTERVENTIONS: 97164- PT Re-evaluation, 97750- Physical Performance Testing, 97110-Therapeutic exercises, 97530- Therapeutic activity, [id number]- Neuromuscular re-education, 97535- Self Care, [id number]- Manual therapy, [phone]- Gait training, [phone]- Prosthetic Initial , [phone]- Orthotic/Prosthetic subsequent, Patient/Family education, Balance training, Stair training, Scar mobilization, and DME instructions  PLAN FOR NEXT SESSION:     Continue to work on standing balance with resistive gait, static and dynamic balance, ambulation with cane stand-alone tip on ramp/curb and level surfaces without assistive device. LE  strength with leg press.      Susannah Daring, PT, DPT 05/23/24 4:08 PM      Referring diagnosis? Z89.511 (ICD-10-CM) - Right below-knee amputee (has history left BKA so now Bil. BKAs) Treatment diagnosis? (if different than referring diagnosis) R26.89, M62.81, M25.661, R26.81 What was this (referring dx) caused by? [x]  Surgery []  Fall []  Ongoing issue []  Arthritis []  Other: ____________  Laterality: []  Rt []  Lt [x]  Both  Check all possible CPT codes:  *CHOOSE 10 OR LESS*  See Planned Interventions listed in the Plan section of the Evaluation.

## 2024-05-23 ENCOUNTER — Ambulatory Visit

## 2024-05-23 DIAGNOSIS — M25661 Stiffness of right knee, not elsewhere classified: Secondary | ICD-10-CM

## 2024-05-23 DIAGNOSIS — R2681 Unsteadiness on feet: Secondary | ICD-10-CM

## 2024-05-23 DIAGNOSIS — R2689 Other abnormalities of gait and mobility: Secondary | ICD-10-CM | POA: Diagnosis not present

## 2024-05-23 DIAGNOSIS — M6281 Muscle weakness (generalized): Secondary | ICD-10-CM | POA: Diagnosis not present

## 2024-05-28 ENCOUNTER — Encounter: Payer: Self-pay | Admitting: Physical Therapy

## 2024-05-28 ENCOUNTER — Ambulatory Visit: Admitting: Physical Therapy

## 2024-05-28 DIAGNOSIS — M6281 Muscle weakness (generalized): Secondary | ICD-10-CM

## 2024-05-28 DIAGNOSIS — M25661 Stiffness of right knee, not elsewhere classified: Secondary | ICD-10-CM | POA: Diagnosis not present

## 2024-05-28 DIAGNOSIS — R2689 Other abnormalities of gait and mobility: Secondary | ICD-10-CM

## 2024-05-28 DIAGNOSIS — R2681 Unsteadiness on feet: Secondary | ICD-10-CM | POA: Diagnosis not present

## 2024-05-28 NOTE — Therapy (Signed)
 OUTPATIENT PHYSICAL THERAPY PROSTHETIC TREATMENT   Patient Name: Kevin Bradford MRN: [id number] DOB:1946/05/09, 78 y.o., male Today's Date: 05/28/2024  END OF SESSION:  PT End of Session - 05/28/24 1421     Visit Number 17    Number of Visits 30    Date for Recertification  07/04/24    Authorization Type [employer]    Authorization Time Period Authorization #[id number]  Tracking #[id number]  04/25/2024-07/04/2024  10 PT Visit    Authorization - Visit Number 7    Authorization - Number of Visits 10    Progress Note Due on Visit 10    PT Start Time 1421    PT Stop Time 1514    PT Time Calculation (min) 53 min    Equipment Utilized During Treatment Gait belt    Activity Tolerance Patient tolerated treatment well    Behavior During Therapy WFL for tasks assessed/performed          Past Medical History:  Diagnosis Date   Arthritis    joint pain (08/16/2017)   Below knee amputation (HCC)    Cellulitis and abscess of leg 10/18/2014   CKD (chronic kidney disease) stage 3, GFR 30-59 ml/min (HCC)    creatinine increases with antibiotics per pt, no known kidney disease per patient   Dehiscence of closure of skin, subsequent encounter    Diabetic foot ulcer (HCC)    left   Diabetic peripheral neuropathy (HCC)    GERD (gastroesophageal reflux disease)    12/26/2017- not this year.    Osteomyelitis (HCC)    left transmetatarsal    Stroke (HCC)    Type II diabetes mellitus [facility])    Past Surgical History:  Procedure Laterality Date   AMPUTATION Left 07/19/2017   Procedure: LEFT TRANSMETATARSAL AMPUTATION;  Surgeon: Harden Jerona GAILS, MD;  Location: [facility] OR;  Service: Orthopedics;  Laterality: Left;   AMPUTATION Left 08/16/2017   REVISION LEFT TRANSMETATARSAL AMPUTATION   AMPUTATION Left 11/22/2017   Procedure: LEFT BELOW KNEE AMPUTATION;  Surgeon: Harden Jerona GAILS, MD;  Location: [facility] OR;  Service: Orthopedics;  Laterality: Left;   AMPUTATION Right 12/20/2023   Procedure: AMPUTATION  RIGHT BELOW KNEE;  Surgeon: Harden Jerona GAILS, MD;  Location: [facility] OR;  Service: Orthopedics;  Laterality: Right;   CATARACT EXTRACTION W/ INTRAOCULAR LENS  IMPLANT, BILATERAL  ~ 2016   EYE SURGERY Bilateral    catarct   I & D EXTREMITY Left 10/17/2014   Procedure: IRRIGATION AND DEBRIDEMENT EXTREMITY LEFT FOOT;  Surgeon: Jerona Harden GAILS, MD;  Location: [facility] OR;  Service: Orthopedics;  Laterality: Left;   IR ANGIO INTRA EXTRACRAN SEL COM CAROTID INNOMINATE BILAT MOD SED  05/07/2018   IR ANGIO VERTEBRAL SEL SUBCLAVIAN INNOMINATE BILAT MOD SED  05/07/2018   STUMP REVISION Left 08/16/2017   Procedure: REVISION LEFT TRANSMETATARSAL AMPUTATION;  Surgeon: Harden Jerona GAILS, MD;  Location: [facility] OR;  Service: Orthopedics;  Laterality: Left;   STUMP REVISION Left 12/27/2017   Procedure: LEFT BELOW KNEE AMPUTATION REVISION;  Surgeon: Harden Jerona GAILS, MD;  Location: [facility] OR;  Service: Orthopedics;  Laterality: Left;   TOE AMPUTATION Right 2013   5th toe   Patient Active Problem List   Diagnosis Date Noted   Coping style affecting medical condition 12/29/2023   Malnutrition of moderate degree 12/25/2023   Right below-knee amputee (HCC) 12/25/2023   Diabetic infection of right foot (HCC) 12/16/2023   History of stroke 12/16/2023   Acute osteomyelitis of right calcaneus (HCC) 12/16/2023  Cutaneous abscess of right foot 12/16/2023   DKA, type 2 (HCC) 12/08/2023   Hypokalemia 12/08/2023   S/P BKA (below knee amputation), left (HCC) 12/08/2023   Hyperosmolar hyperglycemic state (HHS) (HCC) 12/07/2023   Stroke (HCC)    Symptomatic bradycardia 05/06/2018   Postural dizziness with near syncope 05/06/2018   Labile blood glucose    Acute blood loss anemia    Labile blood pressure    CKD (chronic kidney disease) stage 2, GFR 60-89 ml/min    Type 2 diabetes mellitus with peripheral neuropathy (HCC)    Post-operative pain    Amputation of left lower extremity below knee upon examination (HCC) 11/24/2017   Amputated below  knee, left (HCC) 11/22/2017   Osteomyelitis of left foot (HCC)    Dehiscence of amputation stump (HCC) 08/11/2017   S/P transmetatarsal amputation of foot, left (HCC) 07/19/2017   Subacute osteomyelitis, left ankle and foot (HCC) 07/17/2017   Diabetic ulcer of left foot associated with type 2 diabetes mellitus (HCC)    Diabetes mellitus with renal complications (HCC) 10/17/2014   Diabetes (HCC) 10/13/2014   Normocytic anemia 10/13/2014   Hyperkalemia 10/13/2014    PCP: Ransom Other, MD  REFERRING PROVIDER: Harden Jerona GAILS, MD  ONSET DATE: prosthesis delivery: 03/15/2024  REFERRING DIAG: S10.488 (ICD-10-CM) - Right below-knee amputee (HCC)   THERAPY DIAG:  Other abnormalities of gait and mobility  Muscle weakness (generalized)  Unsteadiness on feet  Stiffness of right knee, not elsewhere classified  Rationale for Evaluation and Treatment: Rehabilitation  SUBJECTIVE:   SUBJECTIVE STATEMENT:  He has been doing his stretches.  No falls or near falls.   Pt accompanied by: significant other  PERTINENT HISTORY:  Right TTA 12/20/23, L TTA 2019, DM2, CKD3, PVD, arthritis, peripheral neuropathy, CVA,   Patient is a 78 y.o. male who arrives to PT session with new Rt BKA (12/20/2023) and prostheses (first RLE & new LLE) delivery date 03/15/2024. Patient has a history of previous Lt BKA in 2019. Patient reports being able to stand at sink with UE support. Patient has new prosthesis with vacuum pin suction on both LEs which is new system that he is not familiar with. Patient has progressed wear of Rt prosthesis from 1 hour 2x/day up to 3 hrs, 2x/ day with minimal discomfort.   PAIN:  Are you having pain? No  PRECAUTIONS: None  WEIGHT BEARING RESTRICTIONS: No  FALLS: Has patient fallen in last 6 months? No  LIVING ENVIRONMENT: Lives with: lives with their spouse Lives in: House Home Access: Ramped entrance or by garage which is one step Home layout: Two level, 1/2 bath on main  level (not accessible with w/c), and Bed/bath upstairs Stairs: Yes: Internal: 16 steps; on left going up Has following equipment at home: Single point cane, Environmental Consultant - 2 wheeled, Wheelchair (manual), shower chair, and Tour manager  OCCUPATION: retired, works as art therapist on occasion  PLOF: modified independence  PATIENT GOALS: function with both prostheses at community level, doing yard corporate treasurer   MD appt: follow up 04/30/2024   OBJECTIVE:  Patient-Specific Activity Scoring Scheme  0 represents "unable to perform." 10 represents "able to perform at prior level. 0 1 2 3 4 5 6 7 8 9  10 (Date and Score)  Activity Eval  03/20/24  04/25/2024  1.  walking 0   4  2. standing ADLs  0  8  3. drive 0 0  4.stairs  0 7  5. [place] work 0 6  Score 0  5   Total score = sum of the activity scores/number of activities Minimum detectable change (90%CI) for average score = 2 points Minimum detectable change (90%CI) for single activity score = 3 points  COGNITION: Overall cognitive status: Within functional limits for tasks assessed   SENSATION: WFL  POSTURE: rounded shoulders, forward head, flexed trunk , and weight shift left  LOWER EXTREMITY ROM:  ROM P:passive  A:active Right eval Left eval  Hip flexion    Hip extension    Hip abduction    Hip adduction    Hip internal rotation    Hip external rotation    Knee flexion    Knee extension Seated  P: -5* Seated  P: 0*  Ankle dorsiflexion    Ankle plantarflexion    Ankle inversion    Ankle eversion     (Blank rows = not tested)  LOWER EXTREMITY MMT:  MMT Right eval Left eval  Hip flexion    Hip extension 3-/5 4-/5  Hip abduction 3/5 4-/5  Hip adduction    Hip internal rotation    Hip external rotation    Knee flexion 3/5   Knee extension 3/5   At Evaluation all strength testing is grossly seated and functionally standing / gait. (Blank rows = not tested)  TRANSFERS: Sit to stand:  Mod A, dependent on heavy BUE support 18 chair with armrests, use RW to stabilize to stand upright Stand to sit: Min A with BUE support on armrests to 18 chair, requires RW for stability Lateral scoot transfers: SBA with BUE support and 2 chairs touching same height  FUNCTIONAL TESTs:  Pt requires minA with heavy BUE support on RW for static standing balance.   GAIT: Gait pattern: step to pattern, decreased step length- Right, decreased step length- Left, decreased stance time- Right, decreased hip/knee flexion- Right, Right hip hike, knee flexed in stance- Right, lateral hip instability, trunk flexed, and wide BOS Distance walked: 22' Assistive device utilized: Environmental Consultant - 2 wheeled Level of assistance: Mod A   CURRENT PROSTHETIC WEAR ASSESSMENT: 03/20/2024  Patient is dependent with: skin check, residual limb care, prosthetic cleaning, ply sock cleaning, correct ply sock adjustment, proper wear schedule/adjustment, and proper weight-bearing schedule/adjustment Donning prosthesis: Mod A Doffing prosthesis: Mod A Prosthetic wear tolerance: progressed up to 3 hours, 2x/day for last day,  he has worn right prosthesis daily since delivery 6 days ago.  He is wearing Left prosthesis all awake hours.  Prosthetic weight bearing tolerance: pt tolerated standing & gait with RW support for 5 min total with no c/o limb pain.  Edema: RLE pitting with 5-second capillary refill.  None noted in LLE. Residual limb condition:   Right: bone length 7, limb length 8, scar healed, no hair growth, normal skin color and temperature, 1 medial wound 2mm which is clean with no signs of infection, cylindrical shape  Left: conical shape, shrunk with bony landmarks prominent, dark & callused pressure areas at fibula head, tibial tuberosity and lateral tibial plateau.  Prosthetic description: total contact socket with backing pin suction, dynamic response feet    [facility] PT Assessment - 04/25/24 0001       Berg Balance  Test   Sit to Stand Able to stand  independently using hands    Standing Unsupported Able to stand 30 seconds unsupported    Sitting with Back Unsupported but Feet Supported on Floor or Stool Able to sit safely and securely 2 minutes    Stand to Sit Controls descent  by using hands    Transfers Able to transfer safely, definite need of hands    Standing Unsupported with Eyes Closed Able to stand 10 seconds with supervision    Standing Unsupported with Feet Together Able to place feet together independently but unable to hold for 30 seconds    From Standing, Reach Forward with Outstretched Arm Can reach forward >12 cm safely (5)    From Standing Position, Pick up Object from Floor Able to pick up shoe, needs supervision    From Standing Position, Turn to Look Behind Over each Shoulder Needs supervision when turning    Turn 360 Degrees Needs close supervision or verbal cueing    Standing Unsupported, Alternately Place Feet on Step/Stool Able to complete >2 steps/needs minimal assist    Standing Unsupported, One Foot in Front Able to take small step independently and hold 30 seconds    Standing on One Leg Tries to lift leg/unable to hold 3 seconds but remains standing independently    Total Score 32    Berg comment: intermittent CGA throughout with RW use in between activities           TODAY'S TREATMENT:                                                                                                                             DATE:  05/28/2024 Prosthetic Care with bilateral BKA prostheses: Pt reports wearing prostheses most of awake hours without issues.   Patient ambulated 100' x 2 without an assistive device with supervision. Patient negotiated 12* incline with single hand support on the windowsill simulating railing with minA.  Sidestepping on the incline with hand support on the windowsill with minA.   Patient negotiated 6.5 curb with [facility] with minA and cues on technique. Working on  knee strength and control for stepping up & down using 4 aerobic step in parallel bars BUE support initially progressing to single upper extremity support intermittently.  10 reps with PT cues on technique.  Patient improved knee control with repetition and instruction. Sidestep onto aerobic step using RLE 10 reps and LLE 10 reps with BUE support parallel bars.  PT demo verbal cues on technique.  Neuro Re-Ed:  Ambulation with resistance using pulley weight systems walking out emphasizing power and return emphasizing control.  20 pounds (10 pounds per stack) with weight anterior and posterior with intermittent minA for balance.  15 pounds with BUEs on handle for sidestepping with resistance/weight to his right and to his left with intermittent minA for balance reaction.   TREATMENT:  DATE:  05/23/2024 Prosthetic Care with bilateral BKA prostheses: PT educated and discussed proper alignment of prosthesis and how improper alignment can lead to increased gait deviation, increased imbalance, and potentially lead to orthopedic pain unrelated to residual limb. Patient and spouse acknowledge, understand, and endorse that they have a follow up with the prosthetist on June 03, 2024.   Neuro Re-Ed:  PT demo step over and back with small hurdle within parallel bars in order to challenge appropriate weight shift and challenge single leg balance. Patient performed 20 step over and back with unilat UE use and SBA  with intermittent toe/heel catch when stepping over and back with Rt LE. Patient then attempted to perform step over and back without UE use and close CGA, though unable secondary to lack of confidence, stability, and fear of falling. PT then discontinued use of small hurdle and put 4 step in front of patient to perform step taps. Patient performed 1x10 with unilat UE use with SBA.  Patient then attempted to perform without UE use and could perform 1 step tap with each leg with use of UE secondary to same conditions as earlier. PT then discontinued use of step and attempted to have patient perform weight shifts while stepping fwd/back with one LE, though patient required seated rest break to discuss difficulty of performing each task without the use of an AD.  PT then had patient perform pre-gait in front of chair without UE use while PT had arm in belt providing tactile weight shift onto Lt LE. Patient unable to perform secondary to fear of falling and imbalance.  Patient then ambulated 115' without AD and CGA while PT intermittently called out direction changes (fwd/back) or stop. PT slowly transitioned to direction changes without stopping between each change in order to challenge reaction time and reactionary balance.    TREATMENT:                                                                                                                             DATE:  05/21/2024 Prosthetic Care with bilateral BKA prostheses: Patient performed ascending and descending of 11 stairs using reciprocal pattern with bilat handrails and close SBA-CGA.  PT demonstrated appropriate form of ascending and descending ladder while giving verbal cues throughout. Patient performed ascending and descending of ladder leading with Lt LE up two rungs with CGA and verbal cues required mostly for rotation of leading LE. Patient attempted to perform leading with Rt LE, though unable to safely ascend more than one rung prior to return to floor.  Ambulating with 10# KB in Rt hand then switch to Lt hand 140' total without device with SBA.   Patient ambulated 100' x 2 without AD with CGA.  He needs minA for initial 2-3 steps.  Verbal cues on upright posture.  Neuro Re-Ed:  Resistive gait for Stabilzation with cable machine minA for balance: weight anterior & posterior 20# (10# per stack) 3 reps;  side  stepping  with weight to right & to left 10# 3 reps.        TREATMENT:                                                                                                                             DATE:  05/16/2024 Prosthetic Care with bilateral BKA prostheses: Patient ambulated 100' x 2 without AD with CGA.  He needs minA for initial 2-3 steps.  Verbal cues on upright posture.  Therapeutic activities Leg press: PT cues on full motion with knee control Back at 45* BLE's 112# 15 reps. Back flat BLE's 112# 15 reps. Back flat BLE's marching to work on single-leg stance stability 112# 15 reps. Back flat single LE 56# 10 reps 2 sets on each lower extremity  Therapeutic exercise Gastroc stretch (remaining portion of muscle) forefoot on 2 step 30 sec hold 3 reps for each lower extremity. Hamstring stretch long sit with strap DF 30 sec hold 3 reps for each lower extremity. Hip flexor stretch supine Thomas position30 sec hold 3 reps for each lower extremity. PT updated HEP including HO. Pt verbalized understanding after review and performing in clinic.     HOME EXERCISE PROGRAM: Access Code: [email or web address] URL: [email or web address] Date: 05/16/2024 Prepared by: Grayce Spatz  Exercises - Upright Stance at Door Frame Single Arm  - 1-3 x daily - 7 x weekly - 1 sets - 2 reps - 2 deep breathes hold - Upright Stance at Door Frame with Both Arms  - 1-3 x daily - 7 x weekly - 1 sets - 2 reps - 2 deep breathes hold - standing calf stretch with forefoot on small step or brick  - 1 x daily - 5-7 x weekly - 1 sets - 3 reps - 30 seconds hold - Seated Table Hamstring Stretch  - 1 x daily - 5-7 x weekly - 1 sets - 3 reps - 30 seconds hold - Thomas Stretch on Table  - 1 x daily - 5-7 x weekly - 1 sets - 3 reps - 30 seconds hold   ASSESSMENT:  CLINICAL IMPRESSION:  PT utilized resistance with pulley weight system to work on balance including power and core stabilization.  Patient improved with  repetition and PT instruction.  PT also working on patient's ability to ascend and descend a step which she improved also with PT instruction and repetition.  He does need further training to functionally improve his ability to negotiate a curb and a ramp.  Patient will continue to benefit from skilled PT.   OBJECTIVE IMPAIRMENTS: Abnormal gait, decreased activity tolerance, decreased balance, decreased coordination, decreased endurance, decreased knowledge of use of DME, decreased mobility, difficulty walking, decreased ROM, decreased strength, impaired flexibility, improper body mechanics, postural dysfunction, and prosthetic dependency .   ACTIVITY LIMITATIONS: carrying, lifting, bending, sitting, standing, squatting, stairs, transfers, bathing, toileting, dressing, reach over head, hygiene/grooming, and locomotion level  PARTICIPATION LIMITATIONS: meal prep, cleaning, driving, shopping,  community activity, occupation, and yard work  PERSONAL FACTORS: Age, Fitness, Past/current experiences, Time since onset of injury/illness/exacerbation, and 3+ comorbidities: see PMH are also affecting patient's functional outcome.   REHAB POTENTIAL: Good  CLINICAL DECISION MAKING: Evolving/moderate complexity  EVALUATION COMPLEXITY: Moderate   GOALS: Goals reviewed with patient? Yes  SHORT TERM GOALS: Target date: 04/19/2024  Patient donnes prosthesis modified independent & verbalizes proper cleaning. Baseline: SEE OBJECTIVE DATA Goal status: MET   04/16/2024 2.  Patient tolerates right prosthesis >10 hrs total /day without skin issues or limb pain. Baseline: SEE OBJECTIVE DATA Goal status:  MET 04/16/2024  3.  Patient able to reach 7 and look over both shoulders with RW support with supervision. Baseline: SEE OBJECTIVE DATA Goal status: Met 04/16/2024  4. Patient ambulates 80' with RW & prosthesis with CGA. Baseline: SEE OBJECTIVE DATA Goal status: MET   04/16/2024  5. Patient transfers sit  to/from stand 18 chair with armrest to RW with CGA.  Baseline: SEE OBJECTIVE DATA Goal status: MET   04/16/2024  LONG TERM GOALS: Target date: 07/04/2024  Patient demonstrates & verbalized understanding of prosthetic care to enable safe utilization of prostheses. Baseline: SEE OBJECTIVE DATA Goal status: ongoing     05/28/2024  Patient tolerates prostheses wear >90% of awake hours without skin or limb pain issues. Baseline: SEE OBJECTIVE DATA Goal status: ongoing      05/28/2024  Patient ambulates >500' with LRAD and prostheses independently Baseline: SEE OBJECTIVE DATA Goal status: ongoing     05/28/2024  Patient negotiates ramps, curbs & stairs with single rail with prostheses with LRAD independently. Baseline: SEE OBJECTIVE DATA Goal status: ongoing     05/28/2024  Patient demonstrates & verbalizes lifting, carrying, pushing, pulling with prostheses only safely. Baseline: SEE OBJECTIVE DATA Goal status: ongoing    05/28/2024  6.  Patient reports Patient-Specific Activity Score improved the average >/= 6 to indicate improvement in functional activities.   Baseline: SEE OBJECTIVE DATA Goal status: ongoing   05/28/2024  PLAN:  PT FREQUENCY: 2x/week  PT DURATION: 10 weeks  PLANNED INTERVENTIONS: 97164- PT Re-evaluation, 97750- Physical Performance Testing, 97110-Therapeutic exercises, 97530- Therapeutic activity, [id number]- Neuromuscular re-education, 97535- Self Care, [id number]- Manual therapy, [phone]- Gait training, [phone]- Prosthetic Initial , [phone]- Orthotic/Prosthetic subsequent, Patient/Family education, Balance training, Stair training, Scar mobilization, and DME instructions  PLAN FOR NEXT SESSION:    Continue to work on standing balance with resistive gait, static and dynamic balance, ambulation with cane stand-alone tip on ramp/curb and level surfaces without assistive device. LE strength with leg press or step up/down aerobic step.    Grayce Spatz, PT, DPT 05/28/2024, 4:41  PM   Referring diagnosis? Z89.511 (ICD-10-CM) - Right below-knee amputee (has history left BKA so now Bil. BKAs) Treatment diagnosis? (if different than referring diagnosis) R26.89, M62.81, M25.661, R26.81 What was this (referring dx) caused by? [x]  Surgery []  Fall []  Ongoing issue []  Arthritis []  Other: ____________  Laterality: []  Rt []  Lt [x]  Both  Check all possible CPT codes:  *CHOOSE 10 OR LESS*    See Planned Interventions listed in the Plan section of the Evaluation.

## 2024-05-29 NOTE — Therapy (Signed)
 OUTPATIENT PHYSICAL THERAPY PROSTHETIC TREATMENT   Patient Name: Kevin Bradford MRN: [id number] DOB:[birth year], 78 y.o., male Today's Date: 05/30/2024  END OF SESSION:  PT End of Session - 05/30/24 1435     Visit Number 18    Number of Visits 30    Date for Recertification  07/04/24    Authorization Type [employer]    Authorization Time Period Authorization #[id number]  Tracking #[id number]  04/25/2024-07/04/2024  10 PT Visit    Authorization - Visit Number 8    Authorization - Number of Visits 10    Progress Note Due on Visit 10    PT Start Time 1434    PT Stop Time 1515    PT Time Calculation (min) 41 min    Equipment Utilized During Treatment Gait belt    Activity Tolerance Patient tolerated treatment well    Behavior During Therapy WFL for tasks assessed/performed           Past Medical History:  Diagnosis Date   Arthritis    joint pain (08/16/2017)   Below knee amputation (HCC)    Cellulitis and abscess of leg 10/18/2014   CKD (chronic kidney disease) stage 3, GFR 30-59 ml/min (HCC)    creatinine increases with antibiotics per pt, no known kidney disease per patient   Dehiscence of closure of skin, subsequent encounter    Diabetic foot ulcer (HCC)    left   Diabetic peripheral neuropathy (HCC)    GERD (gastroesophageal reflux disease)    12/26/2017- not this year.    Osteomyelitis (HCC)    left transmetatarsal    Stroke (HCC)    Type II diabetes mellitus [facility])    Past Surgical History:  Procedure Laterality Date   AMPUTATION Left 07/19/2017   Procedure: LEFT TRANSMETATARSAL AMPUTATION;  Surgeon: Harden Jerona GAILS, MD;  Location: [facility] OR;  Service: Orthopedics;  Laterality: Left;   AMPUTATION Left 08/16/2017   REVISION LEFT TRANSMETATARSAL AMPUTATION   AMPUTATION Left 11/22/2017   Procedure: LEFT BELOW KNEE AMPUTATION;  Surgeon: Harden Jerona GAILS, MD;  Location: [facility] OR;  Service: Orthopedics;  Laterality: Left;   AMPUTATION Right 12/20/2023   Procedure: AMPUTATION  RIGHT BELOW KNEE;  Surgeon: Harden Jerona GAILS, MD;  Location: [facility] OR;  Service: Orthopedics;  Laterality: Right;   CATARACT EXTRACTION W/ INTRAOCULAR LENS  IMPLANT, BILATERAL  ~ 2016   EYE SURGERY Bilateral    catarct   I & D EXTREMITY Left 10/17/2014   Procedure: IRRIGATION AND DEBRIDEMENT EXTREMITY LEFT FOOT;  Surgeon: Jerona Harden GAILS, MD;  Location: [facility] OR;  Service: Orthopedics;  Laterality: Left;   IR ANGIO INTRA EXTRACRAN SEL COM CAROTID INNOMINATE BILAT MOD SED  05/07/2018   IR ANGIO VERTEBRAL SEL SUBCLAVIAN INNOMINATE BILAT MOD SED  05/07/2018   STUMP REVISION Left 08/16/2017   Procedure: REVISION LEFT TRANSMETATARSAL AMPUTATION;  Surgeon: Harden Jerona GAILS, MD;  Location: [facility] OR;  Service: Orthopedics;  Laterality: Left;   STUMP REVISION Left 12/27/2017   Procedure: LEFT BELOW KNEE AMPUTATION REVISION;  Surgeon: Harden Jerona GAILS, MD;  Location: [facility] OR;  Service: Orthopedics;  Laterality: Left;   TOE AMPUTATION Right 2013   5th toe   Patient Active Problem List   Diagnosis Date Noted   Coping style affecting medical condition 12/29/2023   Malnutrition of moderate degree 12/25/2023   Right below-knee amputee (HCC) 12/25/2023   Diabetic infection of right foot (HCC) 12/16/2023   History of stroke 12/16/2023   Acute osteomyelitis of right calcaneus (HCC) 12/16/2023  Cutaneous abscess of right foot 12/16/2023   DKA, type 2 (HCC) 12/08/2023   Hypokalemia 12/08/2023   S/P BKA (below knee amputation), left (HCC) 12/08/2023   Hyperosmolar hyperglycemic state (HHS) (HCC) 12/07/2023   Stroke (HCC)    Symptomatic bradycardia 05/06/2018   Postural dizziness with near syncope 05/06/2018   Labile blood glucose    Acute blood loss anemia    Labile blood pressure    CKD (chronic kidney disease) stage 2, GFR 60-89 ml/min    Type 2 diabetes mellitus with peripheral neuropathy (HCC)    Post-operative pain    Amputation of left lower extremity below knee upon examination (HCC) 11/24/2017   Amputated below  knee, left (HCC) 11/22/2017   Osteomyelitis of left foot (HCC)    Dehiscence of amputation stump (HCC) 08/11/2017   S/P transmetatarsal amputation of foot, left (HCC) 07/19/2017   Subacute osteomyelitis, left ankle and foot (HCC) 07/17/2017   Diabetic ulcer of left foot associated with type 2 diabetes mellitus (HCC)    Diabetes mellitus with renal complications (HCC) 10/17/2014   Diabetes (HCC) 10/13/2014   Normocytic anemia 10/13/2014   Hyperkalemia 10/13/2014    PCP: Ransom Other, MD  REFERRING PROVIDER: Harden Jerona GAILS, MD  ONSET DATE: prosthesis delivery: 03/15/2024  REFERRING DIAG: S10.488 (ICD-10-CM) - Right below-knee amputee (HCC)   THERAPY DIAG:  Other abnormalities of gait and mobility  Unsteadiness on feet  Stiffness of right knee, not elsewhere classified  Muscle weakness (generalized)  Rationale for Evaluation and Treatment: Rehabilitation  SUBJECTIVE:   SUBJECTIVE STATEMENT:  Patient reports some discomfort on the Rt tibia/shin following increased activity within last session and at home.  Pt accompanied by: significant other  PERTINENT HISTORY:  Right TTA 12/20/23, L TTA 2019, DM2, CKD3, PVD, arthritis, peripheral neuropathy, CVA,   Patient is a 78 y.o. male who arrives to PT session with new Rt BKA (12/20/2023) and prostheses (first RLE & new LLE) delivery date 03/15/2024. Patient has a history of previous Lt BKA in 2019. Patient reports being able to stand at sink with UE support. Patient has new prosthesis with vacuum pin suction on both LEs which is new system that he is not familiar with. Patient has progressed wear of Rt prosthesis from 1 hour 2x/day up to 3 hrs, 2x/ day with minimal discomfort.   PAIN:  Are you having pain? Not rated, though some soreness noted in Rt residual limb.  PRECAUTIONS: None  WEIGHT BEARING RESTRICTIONS: No  FALLS: Has patient fallen in last 6 months? No  LIVING ENVIRONMENT: Lives with: lives with their spouse Lives in:  House Home Access: Ramped entrance or by garage which is one step Home layout: Two level, 1/2 bath on main level (not accessible with w/c), and Bed/bath upstairs Stairs: Yes: Internal: 16 steps; on left going up Has following equipment at home: Single point cane, Environmental Consultant - 2 wheeled, Wheelchair (manual), shower chair, and Tour manager  OCCUPATION: retired, works as art therapist on occasion  PLOF: modified independence  PATIENT GOALS: function with both prostheses at community level, doing yard corporate treasurer   MD appt: follow up 04/30/2024   OBJECTIVE:  Patient-Specific Activity Scoring Scheme  0 represents "unable to perform." 10 represents "able to perform at prior level. 0 1 2 3 4 5 6 7 8 9  10 (Date and Score)  Activity Eval  03/20/24  04/25/2024  1.  walking 0   4  2. standing ADLs  0  8  3. drive 0 0  4.stairs  0 7  5. Yardwork/handyman work 0 6  Score 0 5   Total score = sum of the activity scores/number of activities Minimum detectable change (90%CI) for average score = 2 points Minimum detectable change (90%CI) for single activity score = 3 points  COGNITION: Overall cognitive status: Within functional limits for tasks assessed   SENSATION: WFL  POSTURE: rounded shoulders, forward head, flexed trunk , and weight shift left  LOWER EXTREMITY ROM:  ROM P:passive  A:active Right eval Left eval  Hip flexion    Hip extension    Hip abduction    Hip adduction    Hip internal rotation    Hip external rotation    Knee flexion    Knee extension Seated  P: -5* Seated  P: 0*  Ankle dorsiflexion    Ankle plantarflexion    Ankle inversion    Ankle eversion     (Blank rows = not tested)  LOWER EXTREMITY MMT:  MMT Right eval Left eval  Hip flexion    Hip extension 3-/5 4-/5  Hip abduction 3/5 4-/5  Hip adduction    Hip internal rotation    Hip external rotation    Knee flexion 3/5   Knee extension 3/5   At Evaluation all strength testing  is grossly seated and functionally standing / gait. (Blank rows = not tested)  TRANSFERS: Sit to stand: Mod A, dependent on heavy BUE support 18 chair with armrests, use RW to stabilize to stand upright Stand to sit: Min A with BUE support on armrests to 18 chair, requires RW for stability Lateral scoot transfers: SBA with BUE support and 2 chairs touching same height  FUNCTIONAL TESTs:  Pt requires minA with heavy BUE support on RW for static standing balance.   GAIT: Gait pattern: step to pattern, decreased step length- Right, decreased step length- Left, decreased stance time- Right, decreased hip/knee flexion- Right, Right hip hike, knee flexed in stance- Right, lateral hip instability, trunk flexed, and wide BOS Distance walked: 22' Assistive device utilized: Environmental Consultant - 2 wheeled Level of assistance: Mod A   CURRENT PROSTHETIC WEAR ASSESSMENT: 03/20/2024  Patient is dependent with: skin check, residual limb care, prosthetic cleaning, ply sock cleaning, correct ply sock adjustment, proper wear schedule/adjustment, and proper weight-bearing schedule/adjustment Donning prosthesis: Mod A Doffing prosthesis: Mod A Prosthetic wear tolerance: progressed up to 3 hours, 2x/day for last day,  he has worn right prosthesis daily since delivery 6 days ago.  He is wearing Left prosthesis all awake hours.  Prosthetic weight bearing tolerance: pt tolerated standing & gait with RW support for 5 min total with no c/o limb pain.  Edema: RLE pitting with 5-second capillary refill.  None noted in LLE. Residual limb condition:   Right: bone length 7, limb length 8, scar healed, no hair growth, normal skin color and temperature, 1 medial wound 2mm which is clean with no signs of infection, cylindrical shape  Left: conical shape, shrunk with bony landmarks prominent, dark & callused pressure areas at fibula head, tibial tuberosity and lateral tibial plateau.  Prosthetic description: total contact socket  with backing pin suction, dynamic response feet    [facility] PT Assessment - 04/25/24 0001       Berg Balance Test   Sit to Stand Able to stand  independently using hands    Standing Unsupported Able to stand 30 seconds unsupported    Sitting with Back Unsupported but Feet Supported on Floor or Stool Able to sit  safely and securely 2 minutes    Stand to Sit Controls descent by using hands    Transfers Able to transfer safely, definite need of hands    Standing Unsupported with Eyes Closed Able to stand 10 seconds with supervision    Standing Unsupported with Feet Together Able to place feet together independently but unable to hold for 30 seconds    From Standing, Reach Forward with Outstretched Arm Can reach forward >12 cm safely (5)    From Standing Position, Pick up Object from Floor Able to pick up shoe, needs supervision    From Standing Position, Turn to Look Behind Over each Shoulder Needs supervision when turning    Turn 360 Degrees Needs close supervision or verbal cueing    Standing Unsupported, Alternately Place Feet on Step/Stool Able to complete >2 steps/needs minimal assist    Standing Unsupported, One Foot in Front Able to take small step independently and hold 30 seconds    Standing on One Leg Tries to lift leg/unable to hold 3 seconds but remains standing independently    Total Score 32    Berg comment: intermittent CGA throughout with RW use in between activities           TODAY'S TREATMENT:                                                                                                                             DATE:  05/30/2024 Prosthetic Care with bilateral BKA prostheses: Patient doffed prosthesis with assistance from spouse (as patient noted that he sometimes has a hard time releasing prosthesis pin) and liner for PT to assess skin on Rt residual limb. Patient has one small area on/around scar that is a closed, dry wound that patient has experienced several times  before and knows s/s of infection if he were to notice any changes over the weekend prior to seeing prosthetist on Monday.  PT discussed POC with patient and what to expect over next few weeks before discharge.   Neuro Re-Ed:  Patient ambulated 123'3 with no AD and CGA while performing suitcase carry with 10# KB with decreased step length, though endorsing no difficulty. Patient then performed second trial with 20# KB to increase challenge and mimic carrying tool bag/extra items while ambulating. Patient ambulated 2x123'3 with no AD and CGA while tossing and catching a tennis ball in order to challenge physical dual tasking. Patient performing slowly and dropped tennis ball often leading to decreased ambulation performance overall. Patient was able to increase step speed in order to try to reach dropped tennis ball with no LOB.  Patient ambulated 123'3 with no AD and CGA while tossing and catching yellow therapy ball in order to challenge physical dual tasking. Patient with improved overall performance compared to with tennis ball above.  Patient ambulated 123'3 with no AD and CGA while tossing and catching yellow therapy ball to/from spouse with improved overall performance and able to reach outside  of BOS without LOB.    TODAY'S TREATMENT:                                                                                                                             DATE:  05/28/2024 Prosthetic Care with bilateral BKA prostheses: Pt reports wearing prostheses most of awake hours without issues.   Patient ambulated 100' x 2 without an assistive device with supervision. Patient negotiated 12* incline with single hand support on the windowsill simulating railing with minA.  Sidestepping on the incline with hand support on the windowsill with minA.   Patient negotiated 6.5 curb with [facility] with minA and cues on technique. Working on knee strength and control for stepping up & down using 4 aerobic step  in parallel bars BUE support initially progressing to single upper extremity support intermittently.  10 reps with PT cues on technique.  Patient improved knee control with repetition and instruction. Sidestep onto aerobic step using RLE 10 reps and LLE 10 reps with BUE support parallel bars.  PT demo verbal cues on technique.  Neuro Re-Ed:  Ambulation with resistance using pulley weight systems walking out emphasizing power and return emphasizing control.  20 pounds (10 pounds per stack) with weight anterior and posterior with intermittent minA for balance.  15 pounds with BUEs on handle for sidestepping with resistance/weight to his right and to his left with intermittent minA for balance reaction.   TREATMENT:                                                                                                                             DATE:  05/23/2024 Prosthetic Care with bilateral BKA prostheses: PT educated and discussed proper alignment of prosthesis and how improper alignment can lead to increased gait deviation, increased imbalance, and potentially lead to orthopedic pain unrelated to residual limb. Patient and spouse acknowledge, understand, and endorse that they have a follow up with the prosthetist on June 03, 2024.   Neuro Re-Ed:  PT demo step over and back with small hurdle within parallel bars in order to challenge appropriate weight shift and challenge single leg balance. Patient performed 20 step over and back with unilat UE use and SBA  with intermittent toe/heel catch when stepping over and back with Rt LE. Patient then attempted to perform step over and back without UE use and close CGA, though unable secondary to lack of confidence,  stability, and fear of falling. PT then discontinued use of small hurdle and put 4 step in front of patient to perform step taps. Patient performed 1x10 with unilat UE use with SBA. Patient then attempted to perform without UE use and could perform 1  step tap with each leg with use of UE secondary to same conditions as earlier. PT then discontinued use of step and attempted to have patient perform weight shifts while stepping fwd/back with one LE, though patient required seated rest break to discuss difficulty of performing each task without the use of an AD.  PT then had patient perform pre-gait in front of chair without UE use while PT had arm in belt providing tactile weight shift onto Lt LE. Patient unable to perform secondary to fear of falling and imbalance.  Patient then ambulated 115' without AD and CGA while PT intermittently called out direction changes (fwd/back) or stop. PT slowly transitioned to direction changes without stopping between each change in order to challenge reaction time and reactionary balance.    TREATMENT:                                                                                                                             DATE:  05/21/2024 Prosthetic Care with bilateral BKA prostheses: Patient performed ascending and descending of 11 stairs using reciprocal pattern with bilat handrails and close SBA-CGA.  PT demonstrated appropriate form of ascending and descending ladder while giving verbal cues throughout. Patient performed ascending and descending of ladder leading with Lt LE up two rungs with CGA and verbal cues required mostly for rotation of leading LE. Patient attempted to perform leading with Rt LE, though unable to safely ascend more than one rung prior to return to floor.  Ambulating with 10# KB in Rt hand then switch to Lt hand 140' total without device with SBA.   Patient ambulated 100' x 2 without AD with CGA.  He needs minA for initial 2-3 steps.  Verbal cues on upright posture.  Neuro Re-Ed:  Resistive gait for Stabilzation with cable machine minA for balance: weight anterior & posterior 20# (10# per stack) 3 reps;  side stepping with weight to right & to left 10# 3 reps.        TREATMENT:  DATE:  05/16/2024 Prosthetic Care with bilateral BKA prostheses: Patient ambulated 100' x 2 without AD with CGA.  He needs minA for initial 2-3 steps.  Verbal cues on upright posture.  Therapeutic activities Leg press: PT cues on full motion with knee control Back at 45* BLE's 112# 15 reps. Back flat BLE's 112# 15 reps. Back flat BLE's marching to work on single-leg stance stability 112# 15 reps. Back flat single LE 56# 10 reps 2 sets on each lower extremity  Therapeutic exercise Gastroc stretch (remaining portion of muscle) forefoot on 2 step 30 sec hold 3 reps for each lower extremity. Hamstring stretch long sit with strap DF 30 sec hold 3 reps for each lower extremity. Hip flexor stretch supine Thomas position30 sec hold 3 reps for each lower extremity. PT updated HEP including HO. Pt verbalized understanding after review and performing in clinic.     HOME EXERCISE PROGRAM: Access Code: [email or web address] URL: [email or web address] Date: 05/16/2024 Prepared by: Grayce Spatz  Exercises - Upright Stance at Door Frame Single Arm  - 1-3 x daily - 7 x weekly - 1 sets - 2 reps - 2 deep breathes hold - Upright Stance at Door Frame with Both Arms  - 1-3 x daily - 7 x weekly - 1 sets - 2 reps - 2 deep breathes hold - standing calf stretch with forefoot on small step or brick  - 1 x daily - 5-7 x weekly - 1 sets - 3 reps - 30 seconds hold - Seated Table Hamstring Stretch  - 1 x daily - 5-7 x weekly - 1 sets - 3 reps - 30 seconds hold - Thomas Stretch on Table  - 1 x daily - 5-7 x weekly - 1 sets - 3 reps - 30 seconds hold   ASSESSMENT:  CLINICAL IMPRESSION:  Patient arrived to session noting increased soreness in Rt residual limb. PT visualized a small, closed, dry wound on central scar on Rt residual limb and educated patient on when to reach out to someone if  s/s of infection were to show up prior to session with prosthetist on Monday; patient and spouse acknowledged and agreed. Patient tolerated and excelled with activities this date and is making great progress towards more independence without AD. Patient will continue to benefit from skilled PT.   OBJECTIVE IMPAIRMENTS: Abnormal gait, decreased activity tolerance, decreased balance, decreased coordination, decreased endurance, decreased knowledge of use of DME, decreased mobility, difficulty walking, decreased ROM, decreased strength, impaired flexibility, improper body mechanics, postural dysfunction, and prosthetic dependency .   ACTIVITY LIMITATIONS: carrying, lifting, bending, sitting, standing, squatting, stairs, transfers, bathing, toileting, dressing, reach over head, hygiene/grooming, and locomotion level  PARTICIPATION LIMITATIONS: meal prep, cleaning, driving, shopping, community activity, occupation, and yard work  PERSONAL FACTORS: Age, Fitness, Past/current experiences, Time since onset of injury/illness/exacerbation, and 3+ comorbidities: see PMH are also affecting patient's functional outcome.   REHAB POTENTIAL: Good  CLINICAL DECISION MAKING: Evolving/moderate complexity  EVALUATION COMPLEXITY: Moderate   GOALS: Goals reviewed with patient? Yes  SHORT TERM GOALS: Target date: 04/19/2024  Patient donnes prosthesis modified independent & verbalizes proper cleaning. Baseline: SEE OBJECTIVE DATA Goal status: MET   04/16/2024 2.  Patient tolerates right prosthesis >10 hrs total /day without skin issues or limb pain. Baseline: SEE OBJECTIVE DATA Goal status:  MET 04/16/2024  3.  Patient able to reach 7 and look over both shoulders with RW support with supervision. Baseline: SEE OBJECTIVE DATA Goal status: Met 04/16/2024  4. Patient ambulates 100'  with RW & prosthesis with CGA. Baseline: SEE OBJECTIVE DATA Goal status: MET   04/16/2024  5. Patient transfers sit to/from stand  18 chair with armrest to RW with CGA.  Baseline: SEE OBJECTIVE DATA Goal status: MET   04/16/2024  LONG TERM GOALS: Target date: 07/04/2024  Patient demonstrates & verbalized understanding of prosthetic care to enable safe utilization of prostheses. Baseline: SEE OBJECTIVE DATA Goal status: ongoing     05/28/2024  Patient tolerates prostheses wear >90% of awake hours without skin or limb pain issues. Baseline: SEE OBJECTIVE DATA Goal status: ongoing      05/28/2024  Patient ambulates >500' with LRAD and prostheses independently Baseline: SEE OBJECTIVE DATA Goal status: ongoing     05/28/2024  Patient negotiates ramps, curbs & stairs with single rail with prostheses with LRAD independently. Baseline: SEE OBJECTIVE DATA Goal status: ongoing     05/28/2024  Patient demonstrates & verbalizes lifting, carrying, pushing, pulling with prostheses only safely. Baseline: SEE OBJECTIVE DATA Goal status: ongoing    05/28/2024  6.  Patient reports Patient-Specific Activity Score improved the average >/= 6 to indicate improvement in functional activities.   Baseline: SEE OBJECTIVE DATA Goal status: ongoing   05/28/2024  PLAN:  PT FREQUENCY: 2x/week  PT DURATION: 10 weeks  PLANNED INTERVENTIONS: 97164- PT Re-evaluation, 97750- Physical Performance Testing, 97110-Therapeutic exercises, 97530- Therapeutic activity, 97112- Neuromuscular re-education, 97535- Self Care, 02859- Manual therapy, [phone]- Gait training, [phone]- Prosthetic Initial , [phone]- Orthotic/Prosthetic subsequent, Patient/Family education, Balance training, Stair training, Scar mobilization, and DME instructions  PLAN FOR NEXT SESSION:   physical dual tasking (fine with cognitive dual tasking)  Continue to work on standing balance with resistive gait, static and dynamic balance, ambulation with cane stand-alone tip on ramp/curb and level surfaces without assistive device. LE strength with leg press or step up/down aerobic  step.    Susannah Daring, PT, DPT 05/30/24 3:43 PM     Referring diagnosis? Z89.511 (ICD-10-CM) - Right below-knee amputee (has history left BKA so now Bil. BKAs) Treatment diagnosis? (if different than referring diagnosis) R26.89, M62.81, M25.661, R26.81 What was this (referring dx) caused by? [x]  Surgery []  Fall []  Ongoing issue []  Arthritis []  Other: ____________  Laterality: []  Rt []  Lt [x]  Both  Check all possible CPT codes:  *CHOOSE 10 OR LESS*    See Planned Interventions listed in the Plan section of the Evaluation.

## 2024-05-30 ENCOUNTER — Ambulatory Visit (INDEPENDENT_AMBULATORY_CARE_PROVIDER_SITE_OTHER)

## 2024-05-30 ENCOUNTER — Encounter

## 2024-05-30 DIAGNOSIS — M25661 Stiffness of right knee, not elsewhere classified: Secondary | ICD-10-CM | POA: Diagnosis not present

## 2024-05-30 DIAGNOSIS — R2681 Unsteadiness on feet: Secondary | ICD-10-CM | POA: Diagnosis not present

## 2024-05-30 DIAGNOSIS — R2689 Other abnormalities of gait and mobility: Secondary | ICD-10-CM

## 2024-05-30 DIAGNOSIS — M6281 Muscle weakness (generalized): Secondary | ICD-10-CM | POA: Diagnosis not present

## 2024-06-04 ENCOUNTER — Encounter: Payer: Self-pay | Admitting: Physical Therapy

## 2024-06-04 ENCOUNTER — Ambulatory Visit: Admitting: Physical Therapy

## 2024-06-04 DIAGNOSIS — M6281 Muscle weakness (generalized): Secondary | ICD-10-CM | POA: Diagnosis not present

## 2024-06-04 DIAGNOSIS — R2681 Unsteadiness on feet: Secondary | ICD-10-CM

## 2024-06-04 DIAGNOSIS — R2689 Other abnormalities of gait and mobility: Secondary | ICD-10-CM

## 2024-06-04 DIAGNOSIS — M25661 Stiffness of right knee, not elsewhere classified: Secondary | ICD-10-CM | POA: Diagnosis not present

## 2024-06-04 NOTE — Therapy (Signed)
 OUTPATIENT PHYSICAL THERAPY PROSTHETIC TREATMENT   Patient Name: Kevin Bradford MRN: [id number] DOB:Nov 15, 1945, 78 y.o., male Today's Date: 06/04/2024  END OF SESSION:  PT End of Session - 06/04/24 1431     Visit Number 19    Number of Visits 30    Date for Recertification  07/04/24    Authorization Type [employer]    Authorization Time Period Authorization #[id number]  Tracking #[id number]  04/25/2024-07/04/2024  10 PT Visit    Authorization - Visit Number 9    Authorization - Number of Visits 10    Progress Note Due on Visit 10    PT Start Time 1430    PT Stop Time 1516    PT Time Calculation (min) 46 min    Equipment Utilized During Treatment Gait belt    Activity Tolerance Patient tolerated treatment well    Behavior During Therapy WFL for tasks assessed/performed            Past Medical History:  Diagnosis Date   Arthritis    joint pain (08/16/2017)   Below knee amputation (HCC)    Cellulitis and abscess of leg 10/18/2014   CKD (chronic kidney disease) stage 3, GFR 30-59 ml/min (HCC)    creatinine increases with antibiotics per pt, no known kidney disease per patient   Dehiscence of closure of skin, subsequent encounter    Diabetic foot ulcer (HCC)    left   Diabetic peripheral neuropathy (HCC)    GERD (gastroesophageal reflux disease)    12/26/2017- not this year.    Osteomyelitis (HCC)    left transmetatarsal    Stroke (HCC)    Type II diabetes mellitus [facility])    Past Surgical History:  Procedure Laterality Date   AMPUTATION Left 07/19/2017   Procedure: LEFT TRANSMETATARSAL AMPUTATION;  Surgeon: Harden Jerona GAILS, MD;  Location: [facility] OR;  Service: Orthopedics;  Laterality: Left;   AMPUTATION Left 08/16/2017   REVISION LEFT TRANSMETATARSAL AMPUTATION   AMPUTATION Left 11/22/2017   Procedure: LEFT BELOW KNEE AMPUTATION;  Surgeon: Harden Jerona GAILS, MD;  Location: [facility] OR;  Service: Orthopedics;  Laterality: Left;   AMPUTATION Right 12/20/2023   Procedure: AMPUTATION  RIGHT BELOW KNEE;  Surgeon: Harden Jerona GAILS, MD;  Location: [facility] OR;  Service: Orthopedics;  Laterality: Right;   CATARACT EXTRACTION W/ INTRAOCULAR LENS  IMPLANT, BILATERAL  ~ 2016   EYE SURGERY Bilateral    catarct   I & D EXTREMITY Left 10/17/2014   Procedure: IRRIGATION AND DEBRIDEMENT EXTREMITY LEFT FOOT;  Surgeon: Jerona Harden GAILS, MD;  Location: [facility] OR;  Service: Orthopedics;  Laterality: Left;   IR ANGIO INTRA EXTRACRAN SEL COM CAROTID INNOMINATE BILAT MOD SED  05/07/2018   IR ANGIO VERTEBRAL SEL SUBCLAVIAN INNOMINATE BILAT MOD SED  05/07/2018   STUMP REVISION Left 08/16/2017   Procedure: REVISION LEFT TRANSMETATARSAL AMPUTATION;  Surgeon: Harden Jerona GAILS, MD;  Location: [facility] OR;  Service: Orthopedics;  Laterality: Left;   STUMP REVISION Left 12/27/2017   Procedure: LEFT BELOW KNEE AMPUTATION REVISION;  Surgeon: Harden Jerona GAILS, MD;  Location: [facility] OR;  Service: Orthopedics;  Laterality: Left;   TOE AMPUTATION Right 2013   5th toe   Patient Active Problem List   Diagnosis Date Noted   Coping style affecting medical condition 12/29/2023   Malnutrition of moderate degree 12/25/2023   Right below-knee amputee (HCC) 12/25/2023   Diabetic infection of right foot (HCC) 12/16/2023   History of stroke 12/16/2023   Acute osteomyelitis of right calcaneus (HCC)  12/16/2023   Cutaneous abscess of right foot 12/16/2023   DKA, type 2 (HCC) 12/08/2023   Hypokalemia 12/08/2023   S/P BKA (below knee amputation), left (HCC) 12/08/2023   Hyperosmolar hyperglycemic state (HHS) (HCC) 12/07/2023   Stroke (HCC)    Symptomatic bradycardia 05/06/2018   Postural dizziness with near syncope 05/06/2018   Labile blood glucose    Acute blood loss anemia    Labile blood pressure    CKD (chronic kidney disease) stage 2, GFR 60-89 ml/min    Type 2 diabetes mellitus with peripheral neuropathy (HCC)    Post-operative pain    Amputation of left lower extremity below knee upon examination (HCC) 11/24/2017   Amputated below  knee, left (HCC) 11/22/2017   Osteomyelitis of left foot (HCC)    Dehiscence of amputation stump (HCC) 08/11/2017   S/P transmetatarsal amputation of foot, left (HCC) 07/19/2017   Subacute osteomyelitis, left ankle and foot (HCC) 07/17/2017   Diabetic ulcer of left foot associated with type 2 diabetes mellitus (HCC)    Diabetes mellitus with renal complications (HCC) 10/17/2014   Diabetes (HCC) 10/13/2014   Normocytic anemia 10/13/2014   Hyperkalemia 10/13/2014    PCP: Ransom Other, MD  REFERRING PROVIDER: Harden Jerona GAILS, MD  ONSET DATE: prosthesis delivery: 03/15/2024  REFERRING DIAG: S10.488 (ICD-10-CM) - Right below-knee amputee (HCC)   THERAPY DIAG:  Other abnormalities of gait and mobility  Unsteadiness on feet  Stiffness of right knee, not elsewhere classified  Muscle weakness (generalized)  Rationale for Evaluation and Treatment: Rehabilitation  SUBJECTIVE:   SUBJECTIVE STATEMENT:  He saw prosthetist yesterday who did alignment changes and shortened left prosthesis.   Pt accompanied by: significant other  PERTINENT HISTORY:  Right TTA 12/20/23, L TTA 2019, DM2, CKD3, PVD, arthritis, peripheral neuropathy, CVA,   Patient is a 78 y.o. male who arrives to PT session with new Rt BKA (12/20/2023) and prostheses (first RLE & new LLE) delivery date 03/15/2024. Patient has a history of previous Lt BKA in 2019. Patient reports being able to stand at sink with UE support. Patient has new prosthesis with vacuum pin suction on both LEs which is new system that he is not familiar with. Patient has progressed wear of Rt prosthesis from 1 hour 2x/day up to 3 hrs, 2x/ day with minimal discomfort.   PAIN:  Are you having pain? Not rated, though some soreness noted in Rt residual limb.  PRECAUTIONS: None  WEIGHT BEARING RESTRICTIONS: No  FALLS: Has patient fallen in last 6 months? No  LIVING ENVIRONMENT: Lives with: lives with their spouse Lives in: House Home Access: Ramped  entrance or by garage which is one step Home layout: Two level, 1/2 bath on main level (not accessible with w/c), and Bed/bath upstairs Stairs: Yes: Internal: 16 steps; on left going up Has following equipment at home: Single point cane, Environmental Consultant - 2 wheeled, Wheelchair (manual), shower chair, and Tour manager  OCCUPATION: retired, works as art therapist on occasion  PLOF: modified independence  PATIENT GOALS: function with both prostheses at community level, doing yard corporate treasurer   MD appt: follow up 04/30/2024   OBJECTIVE:  Patient-Specific Activity Scoring Scheme  0 represents "unable to perform." 10 represents "able to perform at prior level. 0 1 2 3 4 5 6 7 8 9  10 (Date and Score)  Activity Eval  03/20/24  04/25/2024  1.  walking 0   4  2. standing ADLs  0  8  3. drive 0 0  4.stairs  0 7  5. Yardwork/handyman work 0 6  Score 0 5   Total score = sum of the activity scores/number of activities Minimum detectable change (90%CI) for average score = 2 points Minimum detectable change (90%CI) for single activity score = 3 points  COGNITION: Overall cognitive status: Within functional limits for tasks assessed   SENSATION: WFL  POSTURE: rounded shoulders, forward head, flexed trunk , and weight shift left  LOWER EXTREMITY ROM:  ROM P:passive  A:active Right eval Left eval  Hip flexion    Hip extension    Hip abduction    Hip adduction    Hip internal rotation    Hip external rotation    Knee flexion    Knee extension Seated  P: -5* Seated  P: 0*  Ankle dorsiflexion    Ankle plantarflexion    Ankle inversion    Ankle eversion     (Blank rows = not tested)  LOWER EXTREMITY MMT:  MMT Right eval Left eval  Hip flexion    Hip extension 3-/5 4-/5  Hip abduction 3/5 4-/5  Hip adduction    Hip internal rotation    Hip external rotation    Knee flexion 3/5   Knee extension 3/5   At Evaluation all strength testing is grossly seated and  functionally standing / gait. (Blank rows = not tested)  TRANSFERS: Sit to stand: Mod A, dependent on heavy BUE support 18 chair with armrests, use RW to stabilize to stand upright Stand to sit: Min A with BUE support on armrests to 18 chair, requires RW for stability Lateral scoot transfers: SBA with BUE support and 2 chairs touching same height  FUNCTIONAL TESTs:  Pt requires minA with heavy BUE support on RW for static standing balance.   GAIT: Gait pattern: step to pattern, decreased step length- Right, decreased step length- Left, decreased stance time- Right, decreased hip/knee flexion- Right, Right hip hike, knee flexed in stance- Right, lateral hip instability, trunk flexed, and wide BOS Distance walked: 22' Assistive device utilized: Environmental Consultant - 2 wheeled Level of assistance: Mod A   CURRENT PROSTHETIC WEAR ASSESSMENT: 03/20/2024  Patient is dependent with: skin check, residual limb care, prosthetic cleaning, ply sock cleaning, correct ply sock adjustment, proper wear schedule/adjustment, and proper weight-bearing schedule/adjustment Donning prosthesis: Mod A Doffing prosthesis: Mod A Prosthetic wear tolerance: progressed up to 3 hours, 2x/day for last day,  he has worn right prosthesis daily since delivery 6 days ago.  He is wearing Left prosthesis all awake hours.  Prosthetic weight bearing tolerance: pt tolerated standing & gait with RW support for 5 min total with no c/o limb pain.  Edema: RLE pitting with 5-second capillary refill.  None noted in LLE. Residual limb condition:   Right: bone length 7, limb length 8, scar healed, no hair growth, normal skin color and temperature, 1 medial wound 2mm which is clean with no signs of infection, cylindrical shape  Left: conical shape, shrunk with bony landmarks prominent, dark & callused pressure areas at fibula head, tibial tuberosity and lateral tibial plateau.  Prosthetic description: total contact socket with backing pin suction,  dynamic response feet    [facility] PT Assessment - 04/25/24 0001       Berg Balance Test   Sit to Stand Able to stand  independently using hands    Standing Unsupported Able to stand 30 seconds unsupported    Sitting with Back Unsupported but Feet Supported on Floor or Stool Able to sit safely and  securely 2 minutes    Stand to Sit Controls descent by using hands    Transfers Able to transfer safely, definite need of hands    Standing Unsupported with Eyes Closed Able to stand 10 seconds with supervision    Standing Unsupported with Feet Together Able to place feet together independently but unable to hold for 30 seconds    From Standing, Reach Forward with Outstretched Arm Can reach forward >12 cm safely (5)    From Standing Position, Pick up Object from Floor Able to pick up shoe, needs supervision    From Standing Position, Turn to Look Behind Over each Shoulder Needs supervision when turning    Turn 360 Degrees Needs close supervision or verbal cueing    Standing Unsupported, Alternately Place Feet on Step/Stool Able to complete >2 steps/needs minimal assist    Standing Unsupported, One Foot in Front Able to take small step independently and hold 30 seconds    Standing on One Leg Tries to lift leg/unable to hold 3 seconds but remains standing independently    Total Score 32    Berg comment: intermittent CGA throughout with RW use in between activities           TODAY'S TREATMENT:                                                                                                                             DATE:  06/04/2024 Prosthetic Care with bilateral BKA prostheses: Patient ambulated 200' without device with CGA.  Pt has wide base (~8-10) with increased trunk lean for weight shift over stance foot. In //bars using line on floor and mirror with PT cues amb with 4 base.  Stance weight shift with feet in step position with 4 width.  Tactile cues for carryover in gait. Progressed to  gait outside //bars with 4 step width with CGA.    Neuro Re-Ed:  Mountain climbers in plank at counter with emphasis on maintaining narrow stance for generating power with proper hip & knee position / motion.  Walking with resistance using pulley weights out 5' and controlled return 5'.  20# (10# per stack) with weight / resistance posterior and anterior.  Side stepping out & controlled return 15# with weight / resistance to right & to left.     TREATMENT:  DATE:  05/30/2024 Prosthetic Care with bilateral BKA prostheses: Patient doffed prosthesis with assistance from spouse (as patient noted that he sometimes has a hard time releasing prosthesis pin) and liner for PT to assess skin on Rt residual limb. Patient has one small area on/around scar that is a closed, dry wound that patient has experienced several times before and knows s/s of infection if he were to notice any changes over the weekend prior to seeing prosthetist on Monday.  PT discussed POC with patient and what to expect over next few weeks before discharge.   Neuro Re-Ed:  Patient ambulated 123'3 with no AD and CGA while performing suitcase carry with 10# KB with decreased step length, though endorsing no difficulty. Patient then performed second trial with 20# KB to increase challenge and mimic carrying tool bag/extra items while ambulating. Patient ambulated 2x123'3 with no AD and CGA while tossing and catching a tennis ball in order to challenge physical dual tasking. Patient performing slowly and dropped tennis ball often leading to decreased ambulation performance overall. Patient was able to increase step speed in order to try to reach dropped tennis ball with no LOB.  Patient ambulated 123'3 with no AD and CGA while tossing and catching yellow therapy ball in order to challenge physical dual tasking.  Patient with improved overall performance compared to with tennis ball above.  Patient ambulated 123'3 with no AD and CGA while tossing and catching yellow therapy ball to/from spouse with improved overall performance and able to reach outside of BOS without LOB.    TREATMENT:                                                                                                                             DATE:  05/28/2024 Prosthetic Care with bilateral BKA prostheses: Pt reports wearing prostheses most of awake hours without issues.   Patient ambulated 100' x 2 without an assistive device with supervision. Patient negotiated 12* incline with single hand support on the windowsill simulating railing with minA.  Sidestepping on the incline with hand support on the windowsill with minA.   Patient negotiated 6.5 curb with [facility] with minA and cues on technique. Working on knee strength and control for stepping up & down using 4 aerobic step in parallel bars BUE support initially progressing to single upper extremity support intermittently.  10 reps with PT cues on technique.  Patient improved knee control with repetition and instruction. Sidestep onto aerobic step using RLE 10 reps and LLE 10 reps with BUE support parallel bars.  PT demo verbal cues on technique.  Neuro Re-Ed:  Ambulation with resistance using pulley weight systems walking out emphasizing power and return emphasizing control.  20 pounds (10 pounds per stack) with weight anterior and posterior with intermittent minA for balance.  15 pounds with BUEs on handle for sidestepping with resistance/weight to his right and to his left with intermittent minA for balance reaction.   TREATMENT:  DATE:  05/23/2024 Prosthetic Care with bilateral BKA prostheses: PT educated and discussed proper alignment of prosthesis and how  improper alignment can lead to increased gait deviation, increased imbalance, and potentially lead to orthopedic pain unrelated to residual limb. Patient and spouse acknowledge, understand, and endorse that they have a follow up with the prosthetist on June 03, 2024.   Neuro Re-Ed:  PT demo step over and back with small hurdle within parallel bars in order to challenge appropriate weight shift and challenge single leg balance. Patient performed 20 step over and back with unilat UE use and SBA  with intermittent toe/heel catch when stepping over and back with Rt LE. Patient then attempted to perform step over and back without UE use and close CGA, though unable secondary to lack of confidence, stability, and fear of falling. PT then discontinued use of small hurdle and put 4 step in front of patient to perform step taps. Patient performed 1x10 with unilat UE use with SBA. Patient then attempted to perform without UE use and could perform 1 step tap with each leg with use of UE secondary to same conditions as earlier. PT then discontinued use of step and attempted to have patient perform weight shifts while stepping fwd/back with one LE, though patient required seated rest break to discuss difficulty of performing each task without the use of an AD.  PT then had patient perform pre-gait in front of chair without UE use while PT had arm in belt providing tactile weight shift onto Lt LE. Patient unable to perform secondary to fear of falling and imbalance.  Patient then ambulated 115' without AD and CGA while PT intermittently called out direction changes (fwd/back) or stop. PT slowly transitioned to direction changes without stopping between each change in order to challenge reaction time and reactionary balance.    TREATMENT:                                                                                                                             DATE:  05/21/2024 Prosthetic Care with bilateral BKA  prostheses: Patient performed ascending and descending of 11 stairs using reciprocal pattern with bilat handrails and close SBA-CGA.  PT demonstrated appropriate form of ascending and descending ladder while giving verbal cues throughout. Patient performed ascending and descending of ladder leading with Lt LE up two rungs with CGA and verbal cues required mostly for rotation of leading LE. Patient attempted to perform leading with Rt LE, though unable to safely ascend more than one rung prior to return to floor.  Ambulating with 10# KB in Rt hand then switch to Lt hand 140' total without device with SBA.   Patient ambulated 100' x 2 without AD with CGA.  He needs minA for initial 2-3 steps.  Verbal cues on upright posture.  Neuro Re-Ed:  Resistive gait for Stabilzation with cable machine minA for balance: weight anterior & posterior 20# (10# per stack) 3 reps;  side stepping with weight to right & to left 10# 3 reps.       HOME EXERCISE PROGRAM: Access Code: [email or web address] URL: [email or web address] Date: 05/16/2024 Prepared by: Grayce Spatz  Exercises - Upright Stance at Door Frame Single Arm  - 1-3 x daily - 7 x weekly - 1 sets - 2 reps - 2 deep breathes hold - Upright Stance at Door Frame with Both Arms  - 1-3 x daily - 7 x weekly - 1 sets - 2 reps - 2 deep breathes hold - standing calf stretch with forefoot on small step or brick  - 1 x daily - 5-7 x weekly - 1 sets - 3 reps - 30 seconds hold - Seated Table Hamstring Stretch  - 1 x daily - 5-7 x weekly - 1 sets - 3 reps - 30 seconds hold - Thomas Stretch on Table  - 1 x daily - 5-7 x weekly - 1 sets - 3 reps - 30 seconds hold   ASSESSMENT:  CLINICAL IMPRESSION:  Patient was able to ambulate with 4 normal base with PT instruction and repetition which should decrease energy expenditure and improve balance.   Patient will continue to benefit from skilled PT.   OBJECTIVE IMPAIRMENTS: Abnormal gait, decreased activity  tolerance, decreased balance, decreased coordination, decreased endurance, decreased knowledge of use of DME, decreased mobility, difficulty walking, decreased ROM, decreased strength, impaired flexibility, improper body mechanics, postural dysfunction, and prosthetic dependency .   ACTIVITY LIMITATIONS: carrying, lifting, bending, sitting, standing, squatting, stairs, transfers, bathing, toileting, dressing, reach over head, hygiene/grooming, and locomotion level  PARTICIPATION LIMITATIONS: meal prep, cleaning, driving, shopping, community activity, occupation, and yard work  PERSONAL FACTORS: Age, Fitness, Past/current experiences, Time since onset of injury/illness/exacerbation, and 3+ comorbidities: see PMH are also affecting patient's functional outcome.   REHAB POTENTIAL: Good  CLINICAL DECISION MAKING: Evolving/moderate complexity  EVALUATION COMPLEXITY: Moderate   GOALS: Goals reviewed with patient? Yes  SHORT TERM GOALS: Target date: 04/19/2024  Patient donnes prosthesis modified independent & verbalizes proper cleaning. Baseline: SEE OBJECTIVE DATA Goal status: MET   04/16/2024 2.  Patient tolerates right prosthesis >10 hrs total /day without skin issues or limb pain. Baseline: SEE OBJECTIVE DATA Goal status:  MET 04/16/2024  3.  Patient able to reach 7 and look over both shoulders with RW support with supervision. Baseline: SEE OBJECTIVE DATA Goal status: Met 04/16/2024  4. Patient ambulates 25' with RW & prosthesis with CGA. Baseline: SEE OBJECTIVE DATA Goal status: MET   04/16/2024  5. Patient transfers sit to/from stand 18 chair with armrest to RW with CGA.  Baseline: SEE OBJECTIVE DATA Goal status: MET   04/16/2024  LONG TERM GOALS: Target date: 07/04/2024  Patient demonstrates & verbalized understanding of prosthetic care to enable safe utilization of prostheses. Baseline: SEE OBJECTIVE DATA Goal status: ongoing     06/04/2024  Patient tolerates prostheses  wear >90% of awake hours without skin or limb pain issues. Baseline: SEE OBJECTIVE DATA Goal status: ongoing      06/04/2024  Patient ambulates >500' with LRAD and prostheses independently Baseline: SEE OBJECTIVE DATA Goal status: ongoing     06/04/2024  Patient negotiates ramps, curbs & stairs with single rail with prostheses with LRAD independently. Baseline: SEE OBJECTIVE DATA Goal status: ongoing     06/04/2024  Patient demonstrates & verbalizes lifting, carrying, pushing, pulling with prostheses only safely. Baseline: SEE OBJECTIVE DATA Goal status: ongoing    06/04/2024  6.  Patient reports Patient-Specific Activity Score improved  the average >/= 6 to indicate improvement in functional activities.   Baseline: SEE OBJECTIVE DATA Goal status: ongoing   06/04/2024  PLAN:  PT FREQUENCY: 2x/week  PT DURATION: 10 weeks  PLANNED INTERVENTIONS: 97164- PT Re-evaluation, 97750- Physical Performance Testing, 97110-Therapeutic exercises, 97530- Therapeutic activity, 97112- Neuromuscular re-education, 97535- Self Care, 02859- Manual therapy, [phone]- Gait training, [phone]- Prosthetic Initial , [phone]- Orthotic/Prosthetic subsequent, Patient/Family education, Balance training, Stair training, Scar mobilization, and DME instructions  PLAN FOR NEXT SESSION:   Send progress note and do [employer].physical dual tasking (fine with cognitive dual tasking)  Continue to work on standing balance with resistive gait, static and dynamic balance, ambulation with cane stand-alone tip on ramp/curb and level surfaces without assistive device. LE strength with leg press or step up/down aerobic step.   Grayce Spatz, PT, DPT 06/04/2024, 4:20 PM    Referring diagnosis? Z89.511 (ICD-10-CM) - Right below-knee amputee (has history left BKA so now Bil. BKAs) Treatment diagnosis? (if different than referring diagnosis) R26.89, M62.81, M25.661, R26.81 What was this (referring dx) caused  by? [x]  Surgery []  Fall []  Ongoing issue []  Arthritis []  Other: ____________  Laterality: []  Rt []  Lt [x]  Both  Check all possible CPT codes:  *CHOOSE 10 OR LESS*    See Planned Interventions listed in the Plan section of the Evaluation.

## 2024-06-05 NOTE — Therapy (Signed)
 OUTPATIENT PHYSICAL THERAPY PROSTHETIC TREATMENT   Patient Name: Kevin Bradford MRN: [id number] DOB:Jul 20, 1945, 78 y.o., male Today's Date: 06/05/2024  END OF SESSION:      Past Medical History:  Diagnosis Date   Arthritis    joint pain (08/16/2017)   Below knee amputation (HCC)    Cellulitis and abscess of leg 10/18/2014   CKD (chronic kidney disease) stage 3, GFR 30-59 ml/min (HCC)    creatinine increases with antibiotics per pt, no known kidney disease per patient   Dehiscence of closure of skin, subsequent encounter    Diabetic foot ulcer (HCC)    left   Diabetic peripheral neuropathy (HCC)    GERD (gastroesophageal reflux disease)    12/26/2017- not this year.    Osteomyelitis (HCC)    left transmetatarsal    Stroke (HCC)    Type II diabetes mellitus [facility])    Past Surgical History:  Procedure Laterality Date   AMPUTATION Left 07/19/2017   Procedure: LEFT TRANSMETATARSAL AMPUTATION;  Surgeon: Harden Jerona GAILS, MD;  Location: [facility] OR;  Service: Orthopedics;  Laterality: Left;   AMPUTATION Left 08/16/2017   REVISION LEFT TRANSMETATARSAL AMPUTATION   AMPUTATION Left 11/22/2017   Procedure: LEFT BELOW KNEE AMPUTATION;  Surgeon: Harden Jerona GAILS, MD;  Location: [facility] OR;  Service: Orthopedics;  Laterality: Left;   AMPUTATION Right 12/20/2023   Procedure: AMPUTATION RIGHT BELOW KNEE;  Surgeon: Harden Jerona GAILS, MD;  Location: [facility] OR;  Service: Orthopedics;  Laterality: Right;   CATARACT EXTRACTION W/ INTRAOCULAR LENS  IMPLANT, BILATERAL  ~ 2016   EYE SURGERY Bilateral    catarct   I & D EXTREMITY Left 10/17/2014   Procedure: IRRIGATION AND DEBRIDEMENT EXTREMITY LEFT FOOT;  Surgeon: Jerona Harden GAILS, MD;  Location: [facility] OR;  Service: Orthopedics;  Laterality: Left;   IR ANGIO INTRA EXTRACRAN SEL COM CAROTID INNOMINATE BILAT MOD SED  05/07/2018   IR ANGIO VERTEBRAL SEL SUBCLAVIAN INNOMINATE BILAT MOD SED  05/07/2018   STUMP REVISION Left 08/16/2017   Procedure: REVISION LEFT TRANSMETATARSAL  AMPUTATION;  Surgeon: Harden Jerona GAILS, MD;  Location: [facility] OR;  Service: Orthopedics;  Laterality: Left;   STUMP REVISION Left 12/27/2017   Procedure: LEFT BELOW KNEE AMPUTATION REVISION;  Surgeon: Harden Jerona GAILS, MD;  Location: [facility] OR;  Service: Orthopedics;  Laterality: Left;   TOE AMPUTATION Right 2013   5th toe   Patient Active Problem List   Diagnosis Date Noted   Coping style affecting medical condition 12/29/2023   Malnutrition of moderate degree 12/25/2023   Right below-knee amputee (HCC) 12/25/2023   Diabetic infection of right foot (HCC) 12/16/2023   History of stroke 12/16/2023   Acute osteomyelitis of right calcaneus (HCC) 12/16/2023   Cutaneous abscess of right foot 12/16/2023   DKA, type 2 (HCC) 12/08/2023   Hypokalemia 12/08/2023   S/P BKA (below knee amputation), left (HCC) 12/08/2023   Hyperosmolar hyperglycemic state (HHS) (HCC) 12/07/2023   Stroke (HCC)    Symptomatic bradycardia 05/06/2018   Postural dizziness with near syncope 05/06/2018   Labile blood glucose    Acute blood loss anemia    Labile blood pressure    CKD (chronic kidney disease) stage 2, GFR 60-89 ml/min    Type 2 diabetes mellitus with peripheral neuropathy (HCC)    Post-operative pain    Amputation of left lower extremity below knee upon examination (HCC) 11/24/2017   Amputated below knee, left (HCC) 11/22/2017   Osteomyelitis of left foot (HCC)    Dehiscence of amputation  stump (HCC) 08/11/2017   S/P transmetatarsal amputation of foot, left (HCC) 07/19/2017   Subacute osteomyelitis, left ankle and foot (HCC) 07/17/2017   Diabetic ulcer of left foot associated with type 2 diabetes mellitus (HCC)    Diabetes mellitus with renal complications (HCC) 10/17/2014   Diabetes (HCC) 10/13/2014   Normocytic anemia 10/13/2014   Hyperkalemia 10/13/2014    PCP: Ransom Other, MD  REFERRING PROVIDER: Harden Jerona GAILS, MD  ONSET DATE: prosthesis delivery: 03/15/2024  REFERRING DIAG: S10.488 (ICD-10-CM) -  Right below-knee amputee (HCC)   THERAPY DIAG:  No diagnosis found.  Rationale for Evaluation and Treatment: Rehabilitation  SUBJECTIVE:   SUBJECTIVE STATEMENT:  *** He saw prosthetist yesterday who did alignment changes and shortened left prosthesis.   Pt accompanied by: significant other  PERTINENT HISTORY:  Right TTA 12/20/23, L TTA 2019, DM2, CKD3, PVD, arthritis, peripheral neuropathy, CVA,   Patient is a 78 y.o. male who arrives to PT session with new Rt BKA (12/20/2023) and prostheses (first RLE & new LLE) delivery date 03/15/2024. Patient has a history of previous Lt BKA in 2019. Patient reports being able to stand at sink with UE support. Patient has new prosthesis with vacuum pin suction on both LEs which is new system that he is not familiar with. Patient has progressed wear of Rt prosthesis from 1 hour 2x/day up to 3 hrs, 2x/ day with minimal discomfort.   PAIN:  Are you having pain? Not rated, though some soreness noted in Rt residual limb.  PRECAUTIONS: None  WEIGHT BEARING RESTRICTIONS: No  FALLS: Has patient fallen in last 6 months? No  LIVING ENVIRONMENT: Lives with: lives with their spouse Lives in: House Home Access: Ramped entrance or by garage which is one step Home layout: Two level, 1/2 bath on main level (not accessible with w/c), and Bed/bath upstairs Stairs: Yes: Internal: 16 steps; on left going up Has following equipment at home: Single point cane, Environmental Consultant - 2 wheeled, Wheelchair (manual), shower chair, and Tour manager  OCCUPATION: retired, works as art therapist on occasion  PLOF: modified independence  PATIENT GOALS: function with both prostheses at community level, doing yard corporate treasurer   MD appt: follow up 04/30/2024   OBJECTIVE:  Patient-Specific Activity Scoring Scheme  0 represents "unable to perform." 10 represents "able to perform at prior level. 0 1 2 3 4 5 6 7 8 9  10 (Date and Score)  Activity Eval  03/20/24  04/25/2024   1.  walking 0   4  2. standing ADLs  0  8  3. drive 0 0  4.stairs  0 7  5. Yardwork/handyman work 0 6  Score 0 5   Total score = sum of the activity scores/number of activities Minimum detectable change (90%CI) for average score = 2 points Minimum detectable change (90%CI) for single activity score = 3 points  COGNITION: Overall cognitive status: Within functional limits for tasks assessed   SENSATION: WFL  POSTURE: rounded shoulders, forward head, flexed trunk , and weight shift left  LOWER EXTREMITY ROM:  ROM P:passive  A:active Right eval Left eval  Hip flexion    Hip extension    Hip abduction    Hip adduction    Hip internal rotation    Hip external rotation    Knee flexion    Knee extension Seated  P: -5* Seated  P: 0*  Ankle dorsiflexion    Ankle plantarflexion    Ankle inversion    Ankle eversion     (  Blank rows = not tested)  LOWER EXTREMITY MMT:  MMT Right eval Left eval  Hip flexion    Hip extension 3-/5 4-/5  Hip abduction 3/5 4-/5  Hip adduction    Hip internal rotation    Hip external rotation    Knee flexion 3/5   Knee extension 3/5   At Evaluation all strength testing is grossly seated and functionally standing / gait. (Blank rows = not tested)  TRANSFERS: Sit to stand: Mod A, dependent on heavy BUE support 18 chair with armrests, use RW to stabilize to stand upright Stand to sit: Min A with BUE support on armrests to 18 chair, requires RW for stability Lateral scoot transfers: SBA with BUE support and 2 chairs touching same height  FUNCTIONAL TESTs:  Pt requires minA with heavy BUE support on RW for static standing balance.   GAIT: Gait pattern: step to pattern, decreased step length- Right, decreased step length- Left, decreased stance time- Right, decreased hip/knee flexion- Right, Right hip hike, knee flexed in stance- Right, lateral hip instability, trunk flexed, and wide BOS Distance walked: 22' Assistive device utilized:  Environmental Consultant - 2 wheeled Level of assistance: Mod A   CURRENT PROSTHETIC WEAR ASSESSMENT: 03/20/2024  Patient is dependent with: skin check, residual limb care, prosthetic cleaning, ply sock cleaning, correct ply sock adjustment, proper wear schedule/adjustment, and proper weight-bearing schedule/adjustment Donning prosthesis: Mod A Doffing prosthesis: Mod A Prosthetic wear tolerance: progressed up to 3 hours, 2x/day for last day,  he has worn right prosthesis daily since delivery 6 days ago.  He is wearing Left prosthesis all awake hours.  Prosthetic weight bearing tolerance: pt tolerated standing & gait with RW support for 5 min total with no c/o limb pain.  Edema: RLE pitting with 5-second capillary refill.  None noted in LLE. Residual limb condition:   Right: bone length 7, limb length 8, scar healed, no hair growth, normal skin color and temperature, 1 medial wound 2mm which is clean with no signs of infection, cylindrical shape  Left: conical shape, shrunk with bony landmarks prominent, dark & callused pressure areas at fibula head, tibial tuberosity and lateral tibial plateau.  Prosthetic description: total contact socket with backing pin suction, dynamic response feet    [facility] PT Assessment - 04/25/24 0001       Berg Balance Test   Sit to Stand Able to stand  independently using hands    Standing Unsupported Able to stand 30 seconds unsupported    Sitting with Back Unsupported but Feet Supported on Floor or Stool Able to sit safely and securely 2 minutes    Stand to Sit Controls descent by using hands    Transfers Able to transfer safely, definite need of hands    Standing Unsupported with Eyes Closed Able to stand 10 seconds with supervision    Standing Unsupported with Feet Together Able to place feet together independently but unable to hold for 30 seconds    From Standing, Reach Forward with Outstretched Arm Can reach forward >12 cm safely (5)    From Standing Position, Pick up  Object from Floor Able to pick up shoe, needs supervision    From Standing Position, Turn to Look Behind Over each Shoulder Needs supervision when turning    Turn 360 Degrees Needs close supervision or verbal cueing    Standing Unsupported, Alternately Place Feet on Step/Stool Able to complete >2 steps/needs minimal assist    Standing Unsupported, One Foot in Front Able to take small  step independently and hold 30 seconds    Standing on One Leg Tries to lift leg/unable to hold 3 seconds but remains standing independently    Total Score 32    Berg comment: intermittent CGA throughout with RW use in between activities           TODAY'S TREATMENT:                                                                                                                             DATE:  06/06/2024 ***   TODAY'S TREATMENT:                                                                                                                             DATE:  06/04/2024 Prosthetic Care with bilateral BKA prostheses: Patient ambulated 200' without device with CGA.  Pt has wide base (~8-10) with increased trunk lean for weight shift over stance foot. In //bars using line on floor and mirror with PT cues amb with 4 base.  Stance weight shift with feet in step position with 4 width.  Tactile cues for carryover in gait. Progressed to gait outside //bars with 4 step width with CGA.    Neuro Re-Ed:  Mountain climbers in plank at counter with emphasis on maintaining narrow stance for generating power with proper hip & knee position / motion.  Walking with resistance using pulley weights out 5' and controlled return 5'.  20# (10# per stack) with weight / resistance posterior and anterior.  Side stepping out & controlled return 15# with weight / resistance to right & to left.     TREATMENT:                                                                                                                              DATE:  05/30/2024 Prosthetic Care with bilateral BKA prostheses:  Patient doffed prosthesis with assistance from spouse (as patient noted that he sometimes has a hard time releasing prosthesis pin) and liner for PT to assess skin on Rt residual limb. Patient has one small area on/around scar that is a closed, dry wound that patient has experienced several times before and knows s/s of infection if he were to notice any changes over the weekend prior to seeing prosthetist on Monday.  PT discussed POC with patient and what to expect over next few weeks before discharge.   Neuro Re-Ed:  Patient ambulated 123'3 with no AD and CGA while performing suitcase carry with 10# KB with decreased step length, though endorsing no difficulty. Patient then performed second trial with 20# KB to increase challenge and mimic carrying tool bag/extra items while ambulating. Patient ambulated 2x123'3 with no AD and CGA while tossing and catching a tennis ball in order to challenge physical dual tasking. Patient performing slowly and dropped tennis ball often leading to decreased ambulation performance overall. Patient was able to increase step speed in order to try to reach dropped tennis ball with no LOB.  Patient ambulated 123'3 with no AD and CGA while tossing and catching yellow therapy ball in order to challenge physical dual tasking. Patient with improved overall performance compared to with tennis ball above.  Patient ambulated 123'3 with no AD and CGA while tossing and catching yellow therapy ball to/from spouse with improved overall performance and able to reach outside of BOS without LOB.    TREATMENT:                                                                                                                             DATE:  05/28/2024 Prosthetic Care with bilateral BKA prostheses: Pt reports wearing prostheses most of awake hours without issues.   Patient ambulated 100' x 2 without an assistive device  with supervision. Patient negotiated 12* incline with single hand support on the windowsill simulating railing with minA.  Sidestepping on the incline with hand support on the windowsill with minA.   Patient negotiated 6.5 curb with [facility] with minA and cues on technique. Working on knee strength and control for stepping up & down using 4 aerobic step in parallel bars BUE support initially progressing to single upper extremity support intermittently.  10 reps with PT cues on technique.  Patient improved knee control with repetition and instruction. Sidestep onto aerobic step using RLE 10 reps and LLE 10 reps with BUE support parallel bars.  PT demo verbal cues on technique.  Neuro Re-Ed:  Ambulation with resistance using pulley weight systems walking out emphasizing power and return emphasizing control.  20 pounds (10 pounds per stack) with weight anterior and posterior with intermittent minA for balance.  15 pounds with BUEs on handle for sidestepping with resistance/weight to his right and to his left with intermittent minA for balance reaction.   TREATMENT:  DATE:  05/23/2024 Prosthetic Care with bilateral BKA prostheses: PT educated and discussed proper alignment of prosthesis and how improper alignment can lead to increased gait deviation, increased imbalance, and potentially lead to orthopedic pain unrelated to residual limb. Patient and spouse acknowledge, understand, and endorse that they have a follow up with the prosthetist on June 03, 2024.   Neuro Re-Ed:  PT demo step over and back with small hurdle within parallel bars in order to challenge appropriate weight shift and challenge single leg balance. Patient performed 20 step over and back with unilat UE use and SBA  with intermittent toe/heel catch when stepping over and back with Rt LE. Patient then attempted to  perform step over and back without UE use and close CGA, though unable secondary to lack of confidence, stability, and fear of falling. PT then discontinued use of small hurdle and put 4 step in front of patient to perform step taps. Patient performed 1x10 with unilat UE use with SBA. Patient then attempted to perform without UE use and could perform 1 step tap with each leg with use of UE secondary to same conditions as earlier. PT then discontinued use of step and attempted to have patient perform weight shifts while stepping fwd/back with one LE, though patient required seated rest break to discuss difficulty of performing each task without the use of an AD.  PT then had patient perform pre-gait in front of chair without UE use while PT had arm in belt providing tactile weight shift onto Lt LE. Patient unable to perform secondary to fear of falling and imbalance.  Patient then ambulated 115' without AD and CGA while PT intermittently called out direction changes (fwd/back) or stop. PT slowly transitioned to direction changes without stopping between each change in order to challenge reaction time and reactionary balance.       HOME EXERCISE PROGRAM: Access Code: [email or web address] URL: [email or web address] Date: 05/16/2024 Prepared by: Grayce Spatz  Exercises - Upright Stance at Door Frame Single Arm  - 1-3 x daily - 7 x weekly - 1 sets - 2 reps - 2 deep breathes hold - Upright Stance at Door Frame with Both Arms  - 1-3 x daily - 7 x weekly - 1 sets - 2 reps - 2 deep breathes hold - standing calf stretch with forefoot on small step or brick  - 1 x daily - 5-7 x weekly - 1 sets - 3 reps - 30 seconds hold - Seated Table Hamstring Stretch  - 1 x daily - 5-7 x weekly - 1 sets - 3 reps - 30 seconds hold - Thomas Stretch on Table  - 1 x daily - 5-7 x weekly - 1 sets - 3 reps - 30 seconds hold   ASSESSMENT:  CLINICAL IMPRESSION:  *** Patient was able to ambulate with 4 normal base with  PT instruction and repetition which should decrease energy expenditure and improve balance.   Patient will continue to benefit from skilled PT.   OBJECTIVE IMPAIRMENTS: Abnormal gait, decreased activity tolerance, decreased balance, decreased coordination, decreased endurance, decreased knowledge of use of DME, decreased mobility, difficulty walking, decreased ROM, decreased strength, impaired flexibility, improper body mechanics, postural dysfunction, and prosthetic dependency .   ACTIVITY LIMITATIONS: carrying, lifting, bending, sitting, standing, squatting, stairs, transfers, bathing, toileting, dressing, reach over head, hygiene/grooming, and locomotion level  PARTICIPATION LIMITATIONS: meal prep, cleaning, driving, shopping, community activity, occupation, and yard work  PERSONAL FACTORS: Age, Fitness, Past/current experiences, Time since onset of injury/illness/exacerbation, and  3+ comorbidities: see PMH are also affecting patient's functional outcome.   REHAB POTENTIAL: Good  CLINICAL DECISION MAKING: Evolving/moderate complexity  EVALUATION COMPLEXITY: Moderate   GOALS: Goals reviewed with patient? Yes  SHORT TERM GOALS: Target date: 04/19/2024  Patient donnes prosthesis modified independent & verbalizes proper cleaning. Baseline: SEE OBJECTIVE DATA Goal status: MET   04/16/2024 2.  Patient tolerates right prosthesis >10 hrs total /day without skin issues or limb pain. Baseline: SEE OBJECTIVE DATA Goal status:  MET 04/16/2024  3.  Patient able to reach 7 and look over both shoulders with RW support with supervision. Baseline: SEE OBJECTIVE DATA Goal status: Met 04/16/2024  4. Patient ambulates 45' with RW & prosthesis with CGA. Baseline: SEE OBJECTIVE DATA Goal status: MET   04/16/2024  5. Patient transfers sit to/from stand 18 chair with armrest to RW with CGA.  Baseline: SEE OBJECTIVE DATA Goal status: MET   04/16/2024  LONG TERM GOALS: Target date:  07/04/2024  Patient demonstrates & verbalized understanding of prosthetic care to enable safe utilization of prostheses. Baseline: SEE OBJECTIVE DATA Goal status: ongoing     06/04/2024  Patient tolerates prostheses wear >90% of awake hours without skin or limb pain issues. Baseline: SEE OBJECTIVE DATA Goal status: ongoing      06/04/2024  Patient ambulates >500' with LRAD and prostheses independently Baseline: SEE OBJECTIVE DATA Goal status: ongoing     06/04/2024  Patient negotiates ramps, curbs & stairs with single rail with prostheses with LRAD independently. Baseline: SEE OBJECTIVE DATA Goal status: ongoing     06/04/2024  Patient demonstrates & verbalizes lifting, carrying, pushing, pulling with prostheses only safely. Baseline: SEE OBJECTIVE DATA Goal status: ongoing    06/04/2024  6.  Patient reports Patient-Specific Activity Score improved the average >/= 6 to indicate improvement in functional activities.   Baseline: SEE OBJECTIVE DATA Goal status: ongoing   06/04/2024  PLAN:  PT FREQUENCY: 2x/week  PT DURATION: 10 weeks  PLANNED INTERVENTIONS: 97164- PT Re-evaluation, 97750- Physical Performance Testing, 97110-Therapeutic exercises, 97530- Therapeutic activity, 97112- Neuromuscular re-education, 97535- Self Care, [id number]- Manual therapy, [phone]- Gait training, [phone]- Prosthetic Initial , [phone]- Orthotic/Prosthetic subsequent, Patient/Family education, Balance training, Stair training, Scar mobilization, and DME instructions  PLAN FOR NEXT SESSION:   *** Send progress note and do [employer].physical dual tasking (fine with cognitive dual tasking)  Continue to work on standing balance with resistive gait, static and dynamic balance, ambulation with cane stand-alone tip on ramp/curb and level surfaces without assistive device. LE strength with leg press or step up/down aerobic step.   Susannah Daring, PT, DPT 06/05/24 7:57 AM      Referring diagnosis?  Z89.511 (ICD-10-CM) - Right below-knee amputee (has history left BKA so now Bil. BKAs) Treatment diagnosis? (if different than referring diagnosis) R26.89, M62.81, M25.661, R26.81 What was this (referring dx) caused by? [x]  Surgery []  Fall []  Ongoing issue []  Arthritis []  Other: ____________  Laterality: []  Rt []  Lt [x]  Both  Check all possible CPT codes:  *CHOOSE 10 OR LESS*    See Planned Interventions listed in the Plan section of the Evaluation.

## 2024-06-06 ENCOUNTER — Ambulatory Visit

## 2024-06-06 DIAGNOSIS — M6281 Muscle weakness (generalized): Secondary | ICD-10-CM

## 2024-06-06 DIAGNOSIS — R2681 Unsteadiness on feet: Secondary | ICD-10-CM | POA: Diagnosis not present

## 2024-06-06 DIAGNOSIS — M25661 Stiffness of right knee, not elsewhere classified: Secondary | ICD-10-CM

## 2024-06-06 DIAGNOSIS — R2689 Other abnormalities of gait and mobility: Secondary | ICD-10-CM

## 2024-06-10 ENCOUNTER — Encounter: Payer: Self-pay | Admitting: Physical Therapy

## 2024-06-10 ENCOUNTER — Ambulatory Visit (INDEPENDENT_AMBULATORY_CARE_PROVIDER_SITE_OTHER): Admitting: Physical Therapy

## 2024-06-10 DIAGNOSIS — M25661 Stiffness of right knee, not elsewhere classified: Secondary | ICD-10-CM | POA: Diagnosis not present

## 2024-06-10 DIAGNOSIS — R2689 Other abnormalities of gait and mobility: Secondary | ICD-10-CM | POA: Diagnosis not present

## 2024-06-10 DIAGNOSIS — M6281 Muscle weakness (generalized): Secondary | ICD-10-CM | POA: Diagnosis not present

## 2024-06-10 DIAGNOSIS — R2681 Unsteadiness on feet: Secondary | ICD-10-CM | POA: Diagnosis not present

## 2024-06-10 NOTE — Therapy (Signed)
 OUTPATIENT PHYSICAL THERAPY PROSTHETIC TREATMENT   Patient Name: Kevin Bradford MRN: [id number] DOB:1945/09/11, 78 y.o., male Today's Date: 06/10/2024  END OF SESSION:  PT End of Session - 06/10/24 1425     Visit Number 21    Number of Visits 30    Date for Recertification  07/04/24    Authorization Type [employer]IC[employer]    Authorization Time Period NO COPAY  Approved  Authorization #[id number]  Tracking #[id number]  06/06/2024-07/04/2024  10 PT Visits    Authorization - Visit Number 1    Authorization - Number of Visits 10    Progress Note Due on Visit 30    PT Start Time 1425    PT Stop Time 1511    PT Time Calculation (min) 46 min    Equipment Utilized During Treatment Gait belt    Activity Tolerance Patient tolerated treatment well    Behavior During Therapy WFL for tasks assessed/performed          Past Medical History:  Diagnosis Date   Arthritis    joint pain (08/16/2017)   Below knee amputation (HCC)    Cellulitis and abscess of leg 10/18/2014   CKD (chronic kidney disease) stage 3, GFR 30-59 ml/min (HCC)    creatinine increases with antibiotics per pt, no known kidney disease per patient   Dehiscence of closure of skin, subsequent encounter    Diabetic foot ulcer (HCC)    left   Diabetic peripheral neuropathy (HCC)    GERD (gastroesophageal reflux disease)    12/26/2017- not this year.    Osteomyelitis (HCC)    left transmetatarsal    Stroke (HCC)    Type II diabetes mellitus [facility])    Past Surgical History:  Procedure Laterality Date   AMPUTATION Left 07/19/2017   Procedure: LEFT TRANSMETATARSAL AMPUTATION;  Surgeon: Harden Jerona GAILS, MD;  Location: [facility] OR;  Service: Orthopedics;  Laterality: Left;   AMPUTATION Left 08/16/2017   REVISION LEFT TRANSMETATARSAL AMPUTATION   AMPUTATION Left 11/22/2017   Procedure: LEFT BELOW KNEE AMPUTATION;  Surgeon: Harden Jerona GAILS, MD;  Location: [facility] OR;  Service: Orthopedics;  Laterality: Left;   AMPUTATION Right 12/20/2023    Procedure: AMPUTATION RIGHT BELOW KNEE;  Surgeon: Harden Jerona GAILS, MD;  Location: [facility] OR;  Service: Orthopedics;  Laterality: Right;   CATARACT EXTRACTION W/ INTRAOCULAR LENS  IMPLANT, BILATERAL  ~ 2016   EYE SURGERY Bilateral    catarct   I & D EXTREMITY Left 10/17/2014   Procedure: IRRIGATION AND DEBRIDEMENT EXTREMITY LEFT FOOT;  Surgeon: Jerona Harden GAILS, MD;  Location: [facility] OR;  Service: Orthopedics;  Laterality: Left;   IR ANGIO INTRA EXTRACRAN SEL COM CAROTID INNOMINATE BILAT MOD SED  05/07/2018   IR ANGIO VERTEBRAL SEL SUBCLAVIAN INNOMINATE BILAT MOD SED  05/07/2018   STUMP REVISION Left 08/16/2017   Procedure: REVISION LEFT TRANSMETATARSAL AMPUTATION;  Surgeon: Harden Jerona GAILS, MD;  Location: [facility] OR;  Service: Orthopedics;  Laterality: Left;   STUMP REVISION Left 12/27/2017   Procedure: LEFT BELOW KNEE AMPUTATION REVISION;  Surgeon: Harden Jerona GAILS, MD;  Location: [facility] OR;  Service: Orthopedics;  Laterality: Left;   TOE AMPUTATION Right 2013   5th toe   Patient Active Problem List   Diagnosis Date Noted   Coping style affecting medical condition 12/29/2023   Malnutrition of moderate degree 12/25/2023   Right below-knee amputee (HCC) 12/25/2023   Diabetic infection of right foot (HCC) 12/16/2023   History of stroke 12/16/2023   Acute osteomyelitis of  right calcaneus (HCC) 12/16/2023   Cutaneous abscess of right foot 12/16/2023   DKA, type 2 (HCC) 12/08/2023   Hypokalemia 12/08/2023   S/P BKA (below knee amputation), left (HCC) 12/08/2023   Hyperosmolar hyperglycemic state (HHS) (HCC) 12/07/2023   Stroke (HCC)    Symptomatic bradycardia 05/06/2018   Postural dizziness with near syncope 05/06/2018   Labile blood glucose    Acute blood loss anemia    Labile blood pressure    CKD (chronic kidney disease) stage 2, GFR 60-89 ml/min    Type 2 diabetes mellitus with peripheral neuropathy (HCC)    Post-operative pain    Amputation of left lower extremity below knee upon examination (HCC)  11/24/2017   Amputated below knee, left (HCC) 11/22/2017   Osteomyelitis of left foot (HCC)    Dehiscence of amputation stump (HCC) 08/11/2017   S/P transmetatarsal amputation of foot, left (HCC) 07/19/2017   Subacute osteomyelitis, left ankle and foot (HCC) 07/17/2017   Diabetic ulcer of left foot associated with type 2 diabetes mellitus (HCC)    Diabetes mellitus with renal complications (HCC) 10/17/2014   Diabetes (HCC) 10/13/2014   Normocytic anemia 10/13/2014   Hyperkalemia 10/13/2014    PCP: Ransom Other, MD  REFERRING PROVIDER: Harden Jerona GAILS, MD  ONSET DATE: prosthesis delivery: 03/15/2024  REFERRING DIAG: S10.488 (ICD-10-CM) - Right below-knee amputee (HCC)   THERAPY DIAG:  Other abnormalities of gait and mobility  Unsteadiness on feet  Stiffness of right knee, not elsewhere classified  Muscle weakness (generalized)  Rationale for Evaluation and Treatment: Rehabilitation  SUBJECTIVE:   SUBJECTIVE STATEMENT:  He is standing with foot on ~4 riser. He was able to get on/off knees for work.   Pt accompanied by: significant other  PERTINENT HISTORY:  Right TTA 12/20/23, L TTA 2019, DM2, CKD3, PVD, arthritis, peripheral neuropathy, CVA,   Patient is a 78 y.o. male who arrives to PT session with new Rt BKA (12/20/2023) and prostheses (first RLE & new LLE) delivery date 03/15/2024. Patient has a history of previous Lt BKA in 2019. Patient reports being able to stand at sink with UE support. Patient has new prosthesis with vacuum pin suction on both LEs which is new system that he is not familiar with. Patient has progressed wear of Rt prosthesis from 1 hour 2x/day up to 3 hrs, 2x/ day with minimal discomfort.   PAIN:  Are you having pain? Not rated, though some soreness noted in Rt residual limb.  PRECAUTIONS: None  WEIGHT BEARING RESTRICTIONS: No  FALLS: Has patient fallen in last 6 months? No  LIVING ENVIRONMENT: Lives with: lives with their spouse Lives in:  House Home Access: Ramped entrance or by garage which is one step Home layout: Two level, 1/2 bath on main level (not accessible with w/c), and Bed/bath upstairs Stairs: Yes: Internal: 16 steps; on left going up Has following equipment at home: Single point cane, Environmental Consultant - 2 wheeled, Wheelchair (manual), shower chair, and Tour manager  OCCUPATION: retired, works as art therapist on occasion  PLOF: modified independence  PATIENT GOALS: function with both prostheses at community level, doing yard corporate treasurer   MD appt: follow up 04/30/2024   OBJECTIVE:  Patient-Specific Activity Scoring Scheme  0 represents "unable to perform." 10 represents "able to perform at prior level. 0 1 2 3 4 5 6 7 8 9  10 (Date and Score)  Activity Eval  03/20/24  04/25/2024 06/06/2024  1.  walking 0   4 8  2. standing ADLs  0  8 9  3. drive 0 0 0  4.stairs  0 7 9  5. Yardwork/handyman work 0 6 9  Score 0 5 7   Total score = sum of the activity scores/number of activities Minimum detectable change (90%CI) for average score = 2 points Minimum detectable change (90%CI) for single activity score = 3 points  COGNITION: Overall cognitive status: Within functional limits for tasks assessed   SENSATION: WFL  POSTURE: rounded shoulders, forward head, flexed trunk , and weight shift left  LOWER EXTREMITY ROM:  ROM P:passive  A:active Right eval Left eval  Hip flexion    Hip extension    Hip abduction    Hip adduction    Hip internal rotation    Hip external rotation    Knee flexion    Knee extension Seated  P: -5* Seated  P: 0*  Ankle dorsiflexion    Ankle plantarflexion    Ankle inversion    Ankle eversion     (Blank rows = not tested)  LOWER EXTREMITY MMT:  MMT Right eval Left eval  Hip flexion    Hip extension 3-/5 4-/5  Hip abduction 3/5 4-/5  Hip adduction    Hip internal rotation    Hip external rotation    Knee flexion 3/5   Knee extension 3/5   At Evaluation  all strength testing is grossly seated and functionally standing / gait. (Blank rows = not tested)  TRANSFERS: Sit to stand: Mod A, dependent on heavy BUE support 18 chair with armrests, use RW to stabilize to stand upright Stand to sit: Min A with BUE support on armrests to 18 chair, requires RW for stability Lateral scoot transfers: SBA with BUE support and 2 chairs touching same height  FUNCTIONAL TESTs:  Pt requires minA with heavy BUE support on RW for static standing balance.   GAIT: Gait pattern: step to pattern, decreased step length- Right, decreased step length- Left, decreased stance time- Right, decreased hip/knee flexion- Right, Right hip hike, knee flexed in stance- Right, lateral hip instability, trunk flexed, and wide BOS Distance walked: 22' Assistive device utilized: Environmental Consultant - 2 wheeled Level of assistance: Mod A  Gait velocity: 06/06/2024 SS with quad cane: 1.76 ft/s FAC with quad cane: 2.42 ft/s SS without AD: 2.00 ft/s FAC without AD: 2.74 ft/s   CURRENT PROSTHETIC WEAR ASSESSMENT: 03/20/2024  Patient is dependent with: skin check, residual limb care, prosthetic cleaning, ply sock cleaning, correct ply sock adjustment, proper wear schedule/adjustment, and proper weight-bearing schedule/adjustment Donning prosthesis: Mod A Doffing prosthesis: Mod A Prosthetic wear tolerance: progressed up to 3 hours, 2x/day for last day,  he has worn right prosthesis daily since delivery 6 days ago.  He is wearing Left prosthesis all awake hours.  Prosthetic weight bearing tolerance: pt tolerated standing & gait with RW support for 5 min total with no c/o limb pain.  Edema: RLE pitting with 5-second capillary refill.  None noted in LLE. Residual limb condition:   Right: bone length 7, limb length 8, scar healed, no hair growth, normal skin color and temperature, 1 medial wound 2mm which is clean with no signs of infection, cylindrical shape  Left: conical shape, shrunk with bony  landmarks prominent, dark & callused pressure areas at fibula head, tibial tuberosity and lateral tibial plateau.  Prosthetic description: total contact socket with backing pin suction, dynamic response feet   [facility] PT Assessment - 04/25/24 0001       Berg Balance Test  Sit to Stand Able to stand  independently using hands    Standing Unsupported Able to stand 30 seconds unsupported    Sitting with Back Unsupported but Feet Supported on Floor or Stool Able to sit safely and securely 2 minutes    Stand to Sit Controls descent by using hands    Transfers Able to transfer safely, definite need of hands    Standing Unsupported with Eyes Closed Able to stand 10 seconds with supervision    Standing Unsupported with Feet Together Able to place feet together independently but unable to hold for 30 seconds    From Standing, Reach Forward with Outstretched Arm Can reach forward >12 cm safely (5)    From Standing Position, Pick up Object from Floor Able to pick up shoe, needs supervision    From Standing Position, Turn to Look Behind Over each Shoulder Needs supervision when turning    Turn 360 Degrees Needs close supervision or verbal cueing    Standing Unsupported, Alternately Place Feet on Step/Stool Able to complete >2 steps/needs minimal assist    Standing Unsupported, One Foot in Front Able to take small step independently and hold 30 seconds    Standing on One Leg Tries to lift leg/unable to hold 3 seconds but remains standing independently    Total Score 32    Berg comment: intermittent CGA throughout with RW use in between activities            [employer] [facility] PT Assessment - 06/06/24 0001       Berg Balance Test   Sit to Stand Able to stand  independently using hands    Standing Unsupported Able to stand 2 minutes with supervision    Sitting with Back Unsupported but Feet Supported on Floor or Stool Able to sit safely and securely 2 minutes    Stand to Sit Controls descent by using hands     Transfers Able to transfer safely, definite need of hands    Standing Unsupported with Eyes Closed Able to stand 10 seconds with supervision    Standing Unsupported with Feet Together Able to place feet together independently but unable to hold for 30 seconds    From Standing, Reach Forward with Outstretched Arm Can reach forward >12 cm safely (5)    From Standing Position, Pick up Object from Floor Able to pick up shoe, needs supervision    From Standing Position, Turn to Look Behind Over each Shoulder Needs supervision when turning    Turn 360 Degrees Able to turn 360 degrees safely but slowly    Standing Unsupported, Alternately Place Feet on Step/Stool Able to complete >2 steps/needs minimal assist    Standing Unsupported, One Foot in [employer] balance while stepping or standing    Standing on One Leg Unable to try or needs assist to prevent fall    Total Score 31    Berg comment: intermittent CGA throughout with use of unilat quad cane between         Patient able to intermittently perform some BERG activities with quad cane without requirement of supervision, though heavily relying on quad cane for stability (especially with increased single leg activities)    TODAY'S TREATMENT:  DATE:  06/10/2024 Prosthetic Care with bilateral BKA prostheses: Pt amb 50' x 2 without device with supervision working on narrow (2-4) base with supervision.  Neuromuscular Re-education:  working on balance PT educated on safe environment to challenge balance without placing him at risk of falls.  Recommending near sink/counter with chair back near. Pt & wife verbalized understanding.  Standing with one forefoot on cone / lower shelf of counter up to 10 sec.  Rolling tennis ball forward/back, right/left & circles CW / CCW with contralateral UE support.  PT added to HEP including HO  with pt verbalizing understanding.  Straddle stance with WBing on each LE intermittent touch for stability.  PT recommended counting to 30 with freezing count when he needs to touch.  Pt verbalized understanding.  Standing alternating LEs blue theraband in figure 8 above knees stepping into abduction, extension and forward flexion.  Progressed UE support from BUE on counter, to one hand counter / other hand on cabinet at shoulder level (less weight bearing thru UE for support), then BUE on cabinet 2 reps ea UE position for all 3 motions.  PT added to HEP including HO which pt & wife verbalized understanding.   TREATMENT:                                                                                                                             DATE:  06/06/2024 Prosthetic Care with bilateral BKA prostheses: Patient performed ascending of ramp in clinic using a lateral stepping technique with quad cane in Rt UE and holding onto window sill with Lt UE while PT provided CGA. Patient then performed descending of ramp in clinic using fwd ambulation method with quad cane in Rt UE and intermittent use of Lt UE on window sill/wall while PT provided CGA. Patient endorsing inability to safely perform ramp in clinic without bilat UE support secondary to steep grade.  Patient performed resistive ambulation using quad cane with red tube resistance around waist with SBA-CGA. Patient performed 4x20' of fwd ambulation with intermittent no AD use. Patient then performed 1x20' of backward ambulation with patient endorsing 30% of stability coming from quad cane in Lt UE.  No overt LOB throughout that required increase in physical assistance, however, had very small step lengths with backward ambulation and would require stutter step with fwd and back intermittently. PT discussed importance of strength and power with resistive ambulation. PT discussed POC and what to expect to work on in future session with patient and  spouse.  Physical Performance:  BERG balance assessment with results noted above and discussed with patient and spouse  Gait velocity with and without AD with results noted above and discussed with patient and spouse     TREATMENT:  DATE:  06/04/2024 Prosthetic Care with bilateral BKA prostheses: Patient ambulated 200' without device with CGA.  Pt has wide base (~8-10) with increased trunk lean for weight shift over stance foot. In //bars using line on floor and mirror with PT cues amb with 4 base.  Stance weight shift with feet in step position with 4 width.  Tactile cues for carryover in gait. Progressed to gait outside //bars with 4 step width with CGA.    Neuro Re-Ed:  Mountain climbers in plank at counter with emphasis on maintaining narrow stance for generating power with proper hip & knee position / motion.  Walking with resistance using pulley weights out 5' and controlled return 5'.  20# (10# per stack) with weight / resistance posterior and anterior.  Side stepping out & controlled return 15# with weight / resistance to right & to left.     TREATMENT:                                                                                                                             DATE:  05/30/2024 Prosthetic Care with bilateral BKA prostheses: Patient doffed prosthesis with assistance from spouse (as patient noted that he sometimes has a hard time releasing prosthesis pin) and liner for PT to assess skin on Rt residual limb. Patient has one small area on/around scar that is a closed, dry wound that patient has experienced several times before and knows s/s of infection if he were to notice any changes over the weekend prior to seeing prosthetist on Monday.  PT discussed POC with patient and what to expect over next few weeks before discharge.   Neuro Re-Ed:   Patient ambulated 123'3 with no AD and CGA while performing suitcase carry with 10# KB with decreased step length, though endorsing no difficulty. Patient then performed second trial with 20# KB to increase challenge and mimic carrying tool bag/extra items while ambulating. Patient ambulated 2x123'3 with no AD and CGA while tossing and catching a tennis ball in order to challenge physical dual tasking. Patient performing slowly and dropped tennis ball often leading to decreased ambulation performance overall. Patient was able to increase step speed in order to try to reach dropped tennis ball with no LOB.  Patient ambulated 123'3 with no AD and CGA while tossing and catching yellow therapy ball in order to challenge physical dual tasking. Patient with improved overall performance compared to with tennis ball above.  Patient ambulated 123'3 with no AD and CGA while tossing and catching yellow therapy ball to/from spouse with improved overall performance and able to reach outside of BOS without LOB.    HOME EXERCISE PROGRAM: Access Code: [email or web address] URL: [email or web address] Date: 06/10/2024 Prepared by: Grayce Spatz  Exercises - Upright Stance at Door Frame Single Arm  - 1-3 x daily - 7 x weekly - 1 sets - 2 reps - 2 deep breathes hold - Upright Stance at Door Frame with Both Arms  -  1-3 x daily - 7 x weekly - 1 sets - 2 reps - 2 deep breathes hold - standing calf stretch with forefoot on small step or brick  - 1 x daily - 5-7 x weekly - 1 sets - 3 reps - 30 seconds hold - Seated Table Hamstring Stretch  - 1 x daily - 5-7 x weekly - 1 sets - 3 reps - 30 seconds hold - Thomas Stretch on Table  - 1 x daily - 5-7 x weekly - 1 sets - 3 reps - 30 seconds hold - Standing ball rolls front to back  - 1 x daily - 5 x weekly - 1 sets - 10 reps - 5 seconds hold - standing ball rolls inward / outward  - 1 x daily - 5 x weekly - 1 sets - 10 reps - 5 seconds hold - standing ball rolls in  circles  - 1 x daily - 5 x weekly - 1 sets - 10 reps - 5 seconds hold - Hip Extension with Resistance Loop  - 1 x daily - 5 x weekly - 1 sets - 10 reps - 5 seconds hold - Standing Hip Abduction with Resistance at Ankles and Counter Support  - 1 x daily - 5 x weekly - 1 sets - 10 reps - 5 seconds hold - Standing Repeated Hip Flexion with Resistance  - 1 x daily - 5 x weekly - 1 sets - 10 reps - 5 seconds hold   ASSESSMENT:  CLINICAL IMPRESSION:  Patient appears to understand updated HEP to work on balance.  Patient will continue to benefit from skilled PT in order to reach long term goals and become more independent and stable without AD.    OBJECTIVE IMPAIRMENTS: Abnormal gait, decreased activity tolerance, decreased balance, decreased coordination, decreased endurance, decreased knowledge of use of DME, decreased mobility, difficulty walking, decreased ROM, decreased strength, impaired flexibility, improper body mechanics, postural dysfunction, and prosthetic dependency .   ACTIVITY LIMITATIONS: carrying, lifting, bending, sitting, standing, squatting, stairs, transfers, bathing, toileting, dressing, reach over head, hygiene/grooming, and locomotion level  PARTICIPATION LIMITATIONS: meal prep, cleaning, driving, shopping, community activity, occupation, and yard work  PERSONAL FACTORS: Age, Fitness, Past/current experiences, Time since onset of injury/illness/exacerbation, and 3+ comorbidities: see PMH are also affecting patient's functional outcome.   REHAB POTENTIAL: Good  CLINICAL DECISION MAKING: Evolving/moderate complexity  EVALUATION COMPLEXITY: Moderate   GOALS: Goals reviewed with patient? Yes  SHORT TERM GOALS: Target date: 04/19/2024  Patient donnes prosthesis modified independent & verbalizes proper cleaning. Baseline: SEE OBJECTIVE DATA Goal status: MET   04/16/2024 2.  Patient tolerates right prosthesis >10 hrs total /day without skin issues or limb pain. Baseline: SEE  OBJECTIVE DATA Goal status:  MET 04/16/2024  3.  Patient able to reach 7 and look over both shoulders with RW support with supervision. Baseline: SEE OBJECTIVE DATA Goal status: Met 04/16/2024  4. Patient ambulates 6' with RW & prosthesis with CGA. Baseline: SEE OBJECTIVE DATA Goal status: MET   04/16/2024  5. Patient transfers sit to/from stand 18 chair with armrest to RW with CGA.  Baseline: SEE OBJECTIVE DATA Goal status: MET   04/16/2024  LONG TERM GOALS: Target date: 07/04/2024  Patient demonstrates & verbalized understanding of prosthetic care to enable safe utilization of prostheses. Baseline: SEE OBJECTIVE DATA Goal status: ongoing      06/10/2024  Patient tolerates prostheses wear >90% of awake hours without skin or limb pain issues. Baseline: SEE OBJECTIVE DATA Goal  status: ongoing     06/10/2024  Patient ambulates >500' with LRAD and prostheses independently Baseline: SEE OBJECTIVE DATA Goal status: ongoing     06/10/2024  Patient negotiates ramps, curbs & stairs with single rail with prostheses with LRAD independently. Baseline: SEE OBJECTIVE DATA Goal status: ongoing      06/10/2024  Patient demonstrates & verbalizes lifting, carrying, pushing, pulling with prostheses only safely. Baseline: SEE OBJECTIVE DATA Goal status: ongoing    06/10/2024  6.  Patient reports Patient-Specific Activity Score improved the average >/= 6 to indicate improvement in functional activities.   Baseline: SEE OBJECTIVE DATA Goal status: GOAL MET, 06/06/2024  PLAN:  PT FREQUENCY: 2x/week  PT DURATION: 10 weeks  PLANNED INTERVENTIONS: 97164- PT Re-evaluation, 97750- Physical Performance Testing, 97110-Therapeutic exercises, 97530- Therapeutic activity, 97112- Neuromuscular re-education, 97535- Self Care, 02859- Manual therapy, [phone]- Gait training, [phone]- Prosthetic Initial , [phone]- Orthotic/Prosthetic subsequent, Patient/Family education, Balance training, Stair training, Scar  mobilization, and DME instructions  PLAN FOR NEXT SESSION: check updated HEP,  continue with  physical dual tasking (fine with cognitive dual tasking)  Continue to work on standing balance with resistive gait, static and dynamic balance, ambulation with cane stand-alone tip on ramp/curb and level surfaces without assistive device. LE strength with leg press or step up/down aerobic step.    Grayce Spatz, PT, DPT 06/10/2024, 5:49 PM    Referring diagnosis? Z89.511 (ICD-10-CM) - Right below-knee amputee (has history left BKA so now Bil. BKAs) Treatment diagnosis? (if different than referring diagnosis) R26.89, M62.81, M25.661, R26.81 What was this (referring dx) caused by? [x]  Surgery []  Fall []  Ongoing issue []  Arthritis []  Other: ____________  Laterality: []  Rt []  Lt [x]  Both  Check all possible CPT codes:  *CHOOSE 10 OR LESS*    See Planned Interventions listed in the Plan section of the Evaluation.

## 2024-06-12 ENCOUNTER — Ambulatory Visit: Admitting: Physical Therapy

## 2024-06-12 ENCOUNTER — Encounter: Payer: Self-pay | Admitting: Physical Therapy

## 2024-06-12 DIAGNOSIS — R2681 Unsteadiness on feet: Secondary | ICD-10-CM | POA: Diagnosis not present

## 2024-06-12 DIAGNOSIS — M25661 Stiffness of right knee, not elsewhere classified: Secondary | ICD-10-CM

## 2024-06-12 DIAGNOSIS — R2689 Other abnormalities of gait and mobility: Secondary | ICD-10-CM

## 2024-06-12 DIAGNOSIS — M6281 Muscle weakness (generalized): Secondary | ICD-10-CM | POA: Diagnosis not present

## 2024-06-12 NOTE — Therapy (Signed)
 OUTPATIENT PHYSICAL THERAPY PROSTHETIC TREATMENT   Patient Name: Kevin Bradford MRN: [id number] DOB:04/12/1946, 78 y.o., male Today's Date: 06/12/2024  END OF SESSION:  PT End of Session - 06/12/24 1431     Visit Number 22    Number of Visits 30    Date for Recertification  07/04/24    Authorization Type [employer]IC[employer]    Authorization Time Period NO COPAY  Approved  Authorization #[id number]  Tracking #[id number]  06/06/2024-07/04/2024  10 PT Visits    Authorization - Visit Number 2    Authorization - Number of Visits 10    Progress Note Due on Visit 30    PT Start Time 1429    PT Stop Time 1514    PT Time Calculation (min) 45 min    Equipment Utilized During Treatment Gait belt    Activity Tolerance Patient tolerated treatment well    Behavior During Therapy WFL for tasks assessed/performed           Past Medical History:  Diagnosis Date   Arthritis    joint pain (08/16/2017)   Below knee amputation (HCC)    Cellulitis and abscess of leg 10/18/2014   CKD (chronic kidney disease) stage 3, GFR 30-59 ml/min (HCC)    creatinine increases with antibiotics per pt, no known kidney disease per patient   Dehiscence of closure of skin, subsequent encounter    Diabetic foot ulcer (HCC)    left   Diabetic peripheral neuropathy (HCC)    GERD (gastroesophageal reflux disease)    12/26/2017- not this year.    Osteomyelitis (HCC)    left transmetatarsal    Stroke (HCC)    Type II diabetes mellitus [facility])    Past Surgical History:  Procedure Laterality Date   AMPUTATION Left 07/19/2017   Procedure: LEFT TRANSMETATARSAL AMPUTATION;  Surgeon: Harden Jerona GAILS, MD;  Location: [facility] OR;  Service: Orthopedics;  Laterality: Left;   AMPUTATION Left 08/16/2017   REVISION LEFT TRANSMETATARSAL AMPUTATION   AMPUTATION Left 11/22/2017   Procedure: LEFT BELOW KNEE AMPUTATION;  Surgeon: Harden Jerona GAILS, MD;  Location: [facility] OR;  Service: Orthopedics;  Laterality: Left;   AMPUTATION Right 12/20/2023    Procedure: AMPUTATION RIGHT BELOW KNEE;  Surgeon: Harden Jerona GAILS, MD;  Location: [facility] OR;  Service: Orthopedics;  Laterality: Right;   CATARACT EXTRACTION W/ INTRAOCULAR LENS  IMPLANT, BILATERAL  ~ 2016   EYE SURGERY Bilateral    catarct   I & D EXTREMITY Left 10/17/2014   Procedure: IRRIGATION AND DEBRIDEMENT EXTREMITY LEFT FOOT;  Surgeon: Jerona Harden GAILS, MD;  Location: [facility] OR;  Service: Orthopedics;  Laterality: Left;   IR ANGIO INTRA EXTRACRAN SEL COM CAROTID INNOMINATE BILAT MOD SED  05/07/2018   IR ANGIO VERTEBRAL SEL SUBCLAVIAN INNOMINATE BILAT MOD SED  05/07/2018   STUMP REVISION Left 08/16/2017   Procedure: REVISION LEFT TRANSMETATARSAL AMPUTATION;  Surgeon: Harden Jerona GAILS, MD;  Location: [facility] OR;  Service: Orthopedics;  Laterality: Left;   STUMP REVISION Left 12/27/2017   Procedure: LEFT BELOW KNEE AMPUTATION REVISION;  Surgeon: Harden Jerona GAILS, MD;  Location: [facility] OR;  Service: Orthopedics;  Laterality: Left;   TOE AMPUTATION Right 2013   5th toe   Patient Active Problem List   Diagnosis Date Noted   Coping style affecting medical condition 12/29/2023   Malnutrition of moderate degree 12/25/2023   Right below-knee amputee (HCC) 12/25/2023   Diabetic infection of right foot (HCC) 12/16/2023   History of stroke 12/16/2023   Acute osteomyelitis  of right calcaneus (HCC) 12/16/2023   Cutaneous abscess of right foot 12/16/2023   DKA, type 2 (HCC) 12/08/2023   Hypokalemia 12/08/2023   S/P BKA (below knee amputation), left (HCC) 12/08/2023   Hyperosmolar hyperglycemic state (HHS) (HCC) 12/07/2023   Stroke (HCC)    Symptomatic bradycardia 05/06/2018   Postural dizziness with near syncope 05/06/2018   Labile blood glucose    Acute blood loss anemia    Labile blood pressure    CKD (chronic kidney disease) stage 2, GFR 60-89 ml/min    Type 2 diabetes mellitus with peripheral neuropathy (HCC)    Post-operative pain    Amputation of left lower extremity below knee upon examination (HCC)  11/24/2017   Amputated below knee, left (HCC) 11/22/2017   Osteomyelitis of left foot (HCC)    Dehiscence of amputation stump (HCC) 08/11/2017   S/P transmetatarsal amputation of foot, left (HCC) 07/19/2017   Subacute osteomyelitis, left ankle and foot (HCC) 07/17/2017   Diabetic ulcer of left foot associated with type 2 diabetes mellitus (HCC)    Diabetes mellitus with renal complications (HCC) 10/17/2014   Diabetes (HCC) 10/13/2014   Normocytic anemia 10/13/2014   Hyperkalemia 10/13/2014    PCP: Ransom Other, MD  REFERRING PROVIDER: Harden Jerona GAILS, MD  ONSET DATE: prosthesis delivery: 03/15/2024  REFERRING DIAG: S10.488 (ICD-10-CM) - Right below-knee amputee (HCC)   THERAPY DIAG:  Other abnormalities of gait and mobility  Unsteadiness on feet  Stiffness of right knee, not elsewhere classified  Muscle weakness (generalized)  Rationale for Evaluation and Treatment: Rehabilitation  SUBJECTIVE:   SUBJECTIVE STATEMENT:  He is doing his exercises.   Pt accompanied by: significant other  PERTINENT HISTORY:  Right TTA 12/20/23, L TTA 2019, DM2, CKD3, PVD, arthritis, peripheral neuropathy, CVA,   Patient is a 78 y.o. male who arrives to PT session with new Rt BKA (12/20/2023) and prostheses (first RLE & new LLE) delivery date 03/15/2024. Patient has a history of previous Lt BKA in 2019. Patient reports being able to stand at sink with UE support. Patient has new prosthesis with vacuum pin suction on both LEs which is new system that he is not familiar with. Patient has progressed wear of Rt prosthesis from 1 hour 2x/day up to 3 hrs, 2x/ day with minimal discomfort.   PAIN:  Are you having pain? Not rated, though some soreness noted in Rt residual limb.  PRECAUTIONS: None  WEIGHT BEARING RESTRICTIONS: No  FALLS: Has patient fallen in last 6 months? No  LIVING ENVIRONMENT: Lives with: lives with their spouse Lives in: House Home Access: Ramped entrance or by garage which is  one step Home layout: Two level, 1/2 bath on main level (not accessible with w/c), and Bed/bath upstairs Stairs: Yes: Internal: 16 steps; on left going up Has following equipment at home: Single point cane, Environmental Consultant - 2 wheeled, Wheelchair (manual), shower chair, and Tour manager  OCCUPATION: retired, works as art therapist on occasion  PLOF: modified independence  PATIENT GOALS: function with both prostheses at community level, doing yard corporate treasurer   MD appt: follow up 04/30/2024   OBJECTIVE:  Patient-Specific Activity Scoring Scheme  0 represents "unable to perform." 10 represents "able to perform at prior level. 0 1 2 3 4 5 6 7 8 9  10 (Date and Score)  Activity Eval  03/20/24  04/25/2024 06/06/2024  1.  walking 0   4 8  2. standing ADLs  0  8 9  3. drive 0 0 0  4.stairs  0 7 9  5. Yardwork/handyman work 0 6 9  Score 0 5 7   Total score = sum of the activity scores/number of activities Minimum detectable change (90%CI) for average score = 2 points Minimum detectable change (90%CI) for single activity score = 3 points  COGNITION: Overall cognitive status: Within functional limits for tasks assessed   SENSATION: WFL  POSTURE: rounded shoulders, forward head, flexed trunk , and weight shift left  LOWER EXTREMITY ROM:  ROM P:passive  A:active Right eval Left eval  Hip flexion    Hip extension    Hip abduction    Hip adduction    Hip internal rotation    Hip external rotation    Knee flexion    Knee extension Seated  P: -5* Seated  P: 0*  Ankle dorsiflexion    Ankle plantarflexion    Ankle inversion    Ankle eversion     (Blank rows = not tested)  LOWER EXTREMITY MMT:  MMT Right eval Left eval  Hip flexion    Hip extension 3-/5 4-/5  Hip abduction 3/5 4-/5  Hip adduction    Hip internal rotation    Hip external rotation    Knee flexion 3/5   Knee extension 3/5   At Evaluation all strength testing is grossly seated and functionally  standing / gait. (Blank rows = not tested)  TRANSFERS: Sit to stand: Mod A, dependent on heavy BUE support 18 chair with armrests, use RW to stabilize to stand upright Stand to sit: Min A with BUE support on armrests to 18 chair, requires RW for stability Lateral scoot transfers: SBA with BUE support and 2 chairs touching same height  FUNCTIONAL TESTs:  Pt requires minA with heavy BUE support on RW for static standing balance.   GAIT: Gait pattern: step to pattern, decreased step length- Right, decreased step length- Left, decreased stance time- Right, decreased hip/knee flexion- Right, Right hip hike, knee flexed in stance- Right, lateral hip instability, trunk flexed, and wide BOS Distance walked: 22' Assistive device utilized: Environmental Consultant - 2 wheeled Level of assistance: Mod A  Gait velocity: 06/06/2024 SS with quad cane: 1.76 ft/s FAC with quad cane: 2.42 ft/s SS without AD: 2.00 ft/s FAC without AD: 2.74 ft/s   CURRENT PROSTHETIC WEAR ASSESSMENT: 03/20/2024  Patient is dependent with: skin check, residual limb care, prosthetic cleaning, ply sock cleaning, correct ply sock adjustment, proper wear schedule/adjustment, and proper weight-bearing schedule/adjustment Donning prosthesis: Mod A Doffing prosthesis: Mod A Prosthetic wear tolerance: progressed up to 3 hours, 2x/day for last day,  he has worn right prosthesis daily since delivery 6 days ago.  He is wearing Left prosthesis all awake hours.  Prosthetic weight bearing tolerance: pt tolerated standing & gait with RW support for 5 min total with no c/o limb pain.  Edema: RLE pitting with 5-second capillary refill.  None noted in LLE. Residual limb condition:   Right: bone length 7, limb length 8, scar healed, no hair growth, normal skin color and temperature, 1 medial wound 2mm which is clean with no signs of infection, cylindrical shape  Left: conical shape, shrunk with bony landmarks prominent, dark & callused pressure areas at  fibula head, tibial tuberosity and lateral tibial plateau.  Prosthetic description: total contact socket with backing pin suction, dynamic response feet   [facility] PT Assessment - 04/25/24 0001       Berg Balance Test   Sit to Stand Able to stand  independently using hands  Standing Unsupported Able to stand 30 seconds unsupported    Sitting with Back Unsupported but Feet Supported on Floor or Stool Able to sit safely and securely 2 minutes    Stand to Sit Controls descent by using hands    Transfers Able to transfer safely, definite need of hands    Standing Unsupported with Eyes Closed Able to stand 10 seconds with supervision    Standing Unsupported with Feet Together Able to place feet together independently but unable to hold for 30 seconds    From Standing, Reach Forward with Outstretched Arm Can reach forward >12 cm safely (5)    From Standing Position, Pick up Object from Floor Able to pick up shoe, needs supervision    From Standing Position, Turn to Look Behind Over each Shoulder Needs supervision when turning    Turn 360 Degrees Needs close supervision or verbal cueing    Standing Unsupported, Alternately Place Feet on Step/Stool Able to complete >2 steps/needs minimal assist    Standing Unsupported, One Foot in Front Able to take small step independently and hold 30 seconds    Standing on One Leg Tries to lift leg/unable to hold 3 seconds but remains standing independently    Total Score 32    Berg comment: intermittent CGA throughout with RW use in between activities            [facility] PT Assessment - 06/06/24 0001       Berg Balance Test   Sit to Stand Able to stand  independently using hands    Standing Unsupported Able to stand 2 minutes with supervision    Sitting with Back Unsupported but Feet Supported on Floor or Stool Able to sit safely and securely 2 minutes    Stand to Sit Controls descent by using hands    Transfers Able to transfer safely, definite need of  hands    Standing Unsupported with Eyes Closed Able to stand 10 seconds with supervision    Standing Unsupported with Feet Together Able to place feet together independently but unable to hold for 30 seconds    From Standing, Reach Forward with Outstretched Arm Can reach forward >12 cm safely (5)    From Standing Position, Pick up Object from Floor Able to pick up shoe, needs supervision    From Standing Position, Turn to Look Behind Over each Shoulder Needs supervision when turning    Turn 360 Degrees Able to turn 360 degrees safely but slowly    Standing Unsupported, Alternately Place Feet on Step/Stool Able to complete >2 steps/needs minimal assist    Standing Unsupported, One Foot in [employer] balance while stepping or standing    Standing on One Leg Unable to try or needs assist to prevent fall    Total Score 31    Berg comment: intermittent CGA throughout with use of unilat quad cane between         Patient able to intermittently perform some BERG activities with quad cane without requirement of supervision, though heavily relying on quad cane for stability (especially with increased single leg activities)    TODAY'S TREATMENT:  DATE:  06/12/2024 Prosthetic Care with bilateral BKA prostheses: Pt amb 50' x 2 without device with supervision working on narrow (2-4) base with supervision.  Neuromuscular Re-education:  working on balance In //bars stepping on AirEx mat and contralateral LE stepping from floor to 8 step & back to floor. Alternate LEs. Progressed from BUE support to contralateral to ipsilateral UE.   Single limb stance on AirEx with contralateral LE stepping laterally across midline - anteriorly tapping 8 step with stance LE knee extension and posteriorly touching ground with stance knee flexed / unlocked.  10 reps with each LE with BUE support.    Standing on AirEx with BUE support on PT lateral upper arm (encouraging less weight bearing thru UE but enabling assist for stabilization) - head motions 5 reps eyes open & 5 reps eyes closed  - right/left, up/down & diagonals.  PT demo & verbal cues on corner with chair in front for safe environment for home.     TREATMENT:                                                                                                                             DATE:  06/10/2024 Prosthetic Care with bilateral BKA prostheses: Pt amb 50' x 2 without device with supervision working on narrow (2-4) base with supervision.  Neuromuscular Re-education:  working on balance PT educated on safe environment to challenge balance without placing him at risk of falls.  Recommending near sink/counter with chair back near. Pt & wife verbalized understanding.  Standing with one forefoot on cone / lower shelf of counter up to 10 sec.  Rolling tennis ball forward/back, right/left & circles CW / CCW with contralateral UE support.  PT added to HEP including HO with pt verbalizing understanding.  Straddle stance with WBing on each LE intermittent touch for stability.  PT recommended counting to 30 with freezing count when he needs to touch.  Pt verbalized understanding.  Standing alternating LEs blue theraband in figure 8 above knees stepping into abduction, extension and forward flexion.  Progressed UE support from BUE on counter, to one hand counter / other hand on cabinet at shoulder level (less weight bearing thru UE for support), then BUE on cabinet 2 reps ea UE position for all 3 motions.  PT added to HEP including HO which pt & wife verbalized understanding.    TREATMENT:  DATE:  06/06/2024 Prosthetic Care with bilateral BKA prostheses: Patient performed ascending of ramp in clinic using a lateral  stepping technique with quad cane in Rt UE and holding onto window sill with Lt UE while PT provided CGA. Patient then performed descending of ramp in clinic using fwd ambulation method with quad cane in Rt UE and intermittent use of Lt UE on window sill/wall while PT provided CGA. Patient endorsing inability to safely perform ramp in clinic without bilat UE support secondary to steep grade.  Patient performed resistive ambulation using quad cane with red tube resistance around waist with SBA-CGA. Patient performed 4x20' of fwd ambulation with intermittent no AD use. Patient then performed 1x20' of backward ambulation with patient endorsing 30% of stability coming from quad cane in Lt UE.  No overt LOB throughout that required increase in physical assistance, however, had very small step lengths with backward ambulation and would require stutter step with fwd and back intermittently. PT discussed importance of strength and power with resistive ambulation. PT discussed POC and what to expect to work on in future session with patient and spouse.  Physical Performance:  BERG balance assessment with results noted above and discussed with patient and spouse  Gait velocity with and without AD with results noted above and discussed with patient and spouse     TREATMENT:                                                                                                                             DATE:  06/04/2024 Prosthetic Care with bilateral BKA prostheses: Patient ambulated 200' without device with CGA.  Pt has wide base (~8-10) with increased trunk lean for weight shift over stance foot. In //bars using line on floor and mirror with PT cues amb with 4 base.  Stance weight shift with feet in step position with 4 width.  Tactile cues for carryover in gait. Progressed to gait outside //bars with 4 step width with CGA.    Neuro Re-Ed:  Mountain climbers in plank at counter with emphasis on maintaining  narrow stance for generating power with proper hip & knee position / motion.  Walking with resistance using pulley weights out 5' and controlled return 5'.  20# (10# per stack) with weight / resistance posterior and anterior.  Side stepping out & controlled return 15# with weight / resistance to right & to left.     HOME EXERCISE PROGRAM: Access Code: [email or web address] URL: [email or web address] Date: 06/10/2024 Prepared by: Grayce Spatz  Exercises - Upright Stance at Door Frame Single Arm  - 1-3 x daily - 7 x weekly - 1 sets - 2 reps - 2 deep breathes hold - Upright Stance at Door Frame with Both Arms  - 1-3 x daily - 7 x weekly - 1 sets - 2 reps - 2 deep breathes hold - standing calf stretch with forefoot on small step or brick  - 1 x daily - 5-7 x  weekly - 1 sets - 3 reps - 30 seconds hold - Seated Table Hamstring Stretch  - 1 x daily - 5-7 x weekly - 1 sets - 3 reps - 30 seconds hold - Thomas Stretch on Table  - 1 x daily - 5-7 x weekly - 1 sets - 3 reps - 30 seconds hold - Standing ball rolls front to back  - 1 x daily - 5 x weekly - 1 sets - 10 reps - 5 seconds hold - standing ball rolls inward / outward  - 1 x daily - 5 x weekly - 1 sets - 10 reps - 5 seconds hold - standing ball rolls in circles  - 1 x daily - 5 x weekly - 1 sets - 10 reps - 5 seconds hold - Hip Extension with Resistance Loop  - 1 x daily - 5 x weekly - 1 sets - 10 reps - 5 seconds hold - Standing Hip Abduction with Resistance at Ankles and Counter Support  - 1 x daily - 5 x weekly - 1 sets - 10 reps - 5 seconds hold - Standing Repeated Hip Flexion with Resistance  - 1 x daily - 5 x weekly - 1 sets - 10 reps - 5 seconds hold   ASSESSMENT:  CLINICAL IMPRESSION:  PT session focused on balance activities on compliant surfaces facilitating ankle/residual limb (pt reporting residual limb fatigue with activities), hip & step strategies.    Patient will continue to benefit from skilled PT in order to reach long term  goals and become more independent and stable without AD.    OBJECTIVE IMPAIRMENTS: Abnormal gait, decreased activity tolerance, decreased balance, decreased coordination, decreased endurance, decreased knowledge of use of DME, decreased mobility, difficulty walking, decreased ROM, decreased strength, impaired flexibility, improper body mechanics, postural dysfunction, and prosthetic dependency .   ACTIVITY LIMITATIONS: carrying, lifting, bending, sitting, standing, squatting, stairs, transfers, bathing, toileting, dressing, reach over head, hygiene/grooming, and locomotion level  PARTICIPATION LIMITATIONS: meal prep, cleaning, driving, shopping, community activity, occupation, and yard work  PERSONAL FACTORS: Age, Fitness, Past/current experiences, Time since onset of injury/illness/exacerbation, and 3+ comorbidities: see PMH are also affecting patient's functional outcome.   REHAB POTENTIAL: Good  CLINICAL DECISION MAKING: Evolving/moderate complexity  EVALUATION COMPLEXITY: Moderate   GOALS: Goals reviewed with patient? Yes  SHORT TERM GOALS: Target date: 04/19/2024  Patient donnes prosthesis modified independent & verbalizes proper cleaning. Baseline: SEE OBJECTIVE DATA Goal status: MET   04/16/2024 2.  Patient tolerates right prosthesis >10 hrs total /day without skin issues or limb pain. Baseline: SEE OBJECTIVE DATA Goal status:  MET 04/16/2024  3.  Patient able to reach 7 and look over both shoulders with RW support with supervision. Baseline: SEE OBJECTIVE DATA Goal status: Met 04/16/2024  4. Patient ambulates 53' with RW & prosthesis with CGA. Baseline: SEE OBJECTIVE DATA Goal status: MET   04/16/2024  5. Patient transfers sit to/from stand 18 chair with armrest to RW with CGA.  Baseline: SEE OBJECTIVE DATA Goal status: MET   04/16/2024  LONG TERM GOALS: Target date: 07/04/2024  Patient demonstrates & verbalized understanding of prosthetic care to enable safe  utilization of prostheses. Baseline: SEE OBJECTIVE DATA Goal status: ongoing      06/10/2024  Patient tolerates prostheses wear >90% of awake hours without skin or limb pain issues. Baseline: SEE OBJECTIVE DATA Goal status: ongoing     06/10/2024  Patient ambulates >500' with LRAD and prostheses independently Baseline: SEE OBJECTIVE DATA Goal status:  ongoing     06/10/2024  Patient negotiates ramps, curbs & stairs with single rail with prostheses with LRAD independently. Baseline: SEE OBJECTIVE DATA Goal status: ongoing      06/10/2024  Patient demonstrates & verbalizes lifting, carrying, pushing, pulling with prostheses only safely. Baseline: SEE OBJECTIVE DATA Goal status: ongoing    06/10/2024  6.  Patient reports Patient-Specific Activity Score improved the average >/= 6 to indicate improvement in functional activities.   Baseline: SEE OBJECTIVE DATA Goal status: GOAL MET, 06/06/2024  PLAN:  PT FREQUENCY: 2x/week  PT DURATION: 10 weeks  PLANNED INTERVENTIONS: 97164- PT Re-evaluation, 97750- Physical Performance Testing, 97110-Therapeutic exercises, 97530- Therapeutic activity, 97112- Neuromuscular re-education, 97535- Self Care, [id number]- Manual therapy, [phone]- Gait training, [phone]- Prosthetic Initial , [phone]- Orthotic/Prosthetic subsequent, Patient/Family education, Balance training, Stair training, Scar mobilization, and DME instructions  PLAN FOR NEXT SESSION:  Continue to work on standing balance facilitating strategies,  ambulation with cane stand-alone tip on ramp/curb and level surfaces without assistive device. LE strength with leg press or step up/down aerobic step.    Grayce Spatz, PT, DPT 06/12/2024, 6:40 PM    Referring diagnosis? Z89.511 (ICD-10-CM) - Right below-knee amputee (has history left BKA so now Bil. BKAs) Treatment diagnosis? (if different than referring diagnosis) R26.89, M62.81, M25.661, R26.81 What was this (referring dx) caused by? [x]   Surgery []  Fall []  Ongoing issue []  Arthritis []  Other: ____________  Laterality: []  Rt []  Lt [x]  Both  Check all possible CPT codes:  *CHOOSE 10 OR LESS*    See Planned Interventions listed in the Plan section of the Evaluation.

## 2024-06-18 ENCOUNTER — Ambulatory Visit: Admitting: Physical Therapy

## 2024-06-18 ENCOUNTER — Encounter: Payer: Self-pay | Admitting: Physical Therapy

## 2024-06-18 DIAGNOSIS — R2681 Unsteadiness on feet: Secondary | ICD-10-CM | POA: Diagnosis not present

## 2024-06-18 DIAGNOSIS — M25661 Stiffness of right knee, not elsewhere classified: Secondary | ICD-10-CM | POA: Diagnosis not present

## 2024-06-18 DIAGNOSIS — R2689 Other abnormalities of gait and mobility: Secondary | ICD-10-CM | POA: Diagnosis not present

## 2024-06-18 DIAGNOSIS — M6281 Muscle weakness (generalized): Secondary | ICD-10-CM | POA: Diagnosis not present

## 2024-06-18 NOTE — Therapy (Signed)
 OUTPATIENT PHYSICAL THERAPY PROSTHETIC TREATMENT   Patient Name: Kevin Bradford MRN: [id number] DOB:1946-06-20, 78 y.o., male Today's Date: 06/18/2024  END OF SESSION:  PT End of Session - 06/18/24 1431     Visit Number 23    Number of Visits 30    Date for Recertification  07/04/24    Authorization Type [employer]IC[employer]    Authorization Time Period NO COPAY  Approved  Authorization #[id number]  Tracking #[id number]  06/06/2024-07/04/2024  10 PT Visits    Authorization - Visit Number 3    Authorization - Number of Visits 10    Progress Note Due on Visit 30    PT Start Time 1430    PT Stop Time 1514    PT Time Calculation (min) 44 min    Equipment Utilized During Treatment Gait belt    Activity Tolerance Patient tolerated treatment well    Behavior During Therapy WFL for tasks assessed/performed            Past Medical History:  Diagnosis Date   Arthritis    joint pain (08/16/2017)   Below knee amputation (HCC)    Cellulitis and abscess of leg 10/18/2014   CKD (chronic kidney disease) stage 3, GFR 30-59 ml/min (HCC)    creatinine increases with antibiotics per pt, no known kidney disease per patient   Dehiscence of closure of skin, subsequent encounter    Diabetic foot ulcer (HCC)    left   Diabetic peripheral neuropathy (HCC)    GERD (gastroesophageal reflux disease)    12/26/2017- not this year.    Osteomyelitis (HCC)    left transmetatarsal    Stroke (HCC)    Type II diabetes mellitus [facility])    Past Surgical History:  Procedure Laterality Date   AMPUTATION Left 07/19/2017   Procedure: LEFT TRANSMETATARSAL AMPUTATION;  Surgeon: Harden Jerona GAILS, MD;  Location: [facility] OR;  Service: Orthopedics;  Laterality: Left;   AMPUTATION Left 08/16/2017   REVISION LEFT TRANSMETATARSAL AMPUTATION   AMPUTATION Left 11/22/2017   Procedure: LEFT BELOW KNEE AMPUTATION;  Surgeon: Harden Jerona GAILS, MD;  Location: [facility] OR;  Service: Orthopedics;  Laterality: Left;   AMPUTATION Right 12/20/2023    Procedure: AMPUTATION RIGHT BELOW KNEE;  Surgeon: Harden Jerona GAILS, MD;  Location: [facility] OR;  Service: Orthopedics;  Laterality: Right;   CATARACT EXTRACTION W/ INTRAOCULAR LENS  IMPLANT, BILATERAL  ~ 2016   EYE SURGERY Bilateral    catarct   I & D EXTREMITY Left 10/17/2014   Procedure: IRRIGATION AND DEBRIDEMENT EXTREMITY LEFT FOOT;  Surgeon: Jerona Harden GAILS, MD;  Location: [facility] OR;  Service: Orthopedics;  Laterality: Left;   IR ANGIO INTRA EXTRACRAN SEL COM CAROTID INNOMINATE BILAT MOD SED  05/07/2018   IR ANGIO VERTEBRAL SEL SUBCLAVIAN INNOMINATE BILAT MOD SED  05/07/2018   STUMP REVISION Left 08/16/2017   Procedure: REVISION LEFT TRANSMETATARSAL AMPUTATION;  Surgeon: Harden Jerona GAILS, MD;  Location: [facility] OR;  Service: Orthopedics;  Laterality: Left;   STUMP REVISION Left 12/27/2017   Procedure: LEFT BELOW KNEE AMPUTATION REVISION;  Surgeon: Harden Jerona GAILS, MD;  Location: [facility] OR;  Service: Orthopedics;  Laterality: Left;   TOE AMPUTATION Right 2013   5th toe   Patient Active Problem List   Diagnosis Date Noted   Coping style affecting medical condition 12/29/2023   Malnutrition of moderate degree 12/25/2023   Right below-knee amputee (HCC) 12/25/2023   Diabetic infection of right foot (HCC) 12/16/2023   History of stroke 12/16/2023   Acute  osteomyelitis of right calcaneus (HCC) 12/16/2023   Cutaneous abscess of right foot 12/16/2023   DKA, type 2 (HCC) 12/08/2023   Hypokalemia 12/08/2023   S/P BKA (below knee amputation), left (HCC) 12/08/2023   Hyperosmolar hyperglycemic state (HHS) (HCC) 12/07/2023   Stroke (HCC)    Symptomatic bradycardia 05/06/2018   Postural dizziness with near syncope 05/06/2018   Labile blood glucose    Acute blood loss anemia    Labile blood pressure    CKD (chronic kidney disease) stage 2, GFR 60-89 ml/min    Type 2 diabetes mellitus with peripheral neuropathy (HCC)    Post-operative pain    Amputation of left lower extremity below knee upon examination (HCC)  11/24/2017   Amputated below knee, left (HCC) 11/22/2017   Osteomyelitis of left foot (HCC)    Dehiscence of amputation stump (HCC) 08/11/2017   S/P transmetatarsal amputation of foot, left (HCC) 07/19/2017   Subacute osteomyelitis, left ankle and foot (HCC) 07/17/2017   Diabetic ulcer of left foot associated with type 2 diabetes mellitus (HCC)    Diabetes mellitus with renal complications (HCC) 10/17/2014   Diabetes (HCC) 10/13/2014   Normocytic anemia 10/13/2014   Hyperkalemia 10/13/2014    PCP: Ransom Other, MD  REFERRING PROVIDER: Harden Jerona GAILS, MD  ONSET DATE: prosthesis delivery: 03/15/2024  REFERRING DIAG: S10.488 (ICD-10-CM) - Right below-knee amputee (HCC)   THERAPY DIAG:  Other abnormalities of gait and mobility  Unsteadiness on feet  Stiffness of right knee, not elsewhere classified  Muscle weakness (generalized)  Rationale for Evaluation and Treatment: Rehabilitation  SUBJECTIVE:   SUBJECTIVE STATEMENT:  He went to son's for Thanksgiving and had no issues with new environment or large dogs.     Pt accompanied by: significant other  PERTINENT HISTORY:  Right TTA 12/20/23, L TTA 2019, DM2, CKD3, PVD, arthritis, peripheral neuropathy, CVA,   Patient is a 78 y.o. male who arrives to PT session with new Rt BKA (12/20/2023) and prostheses (first RLE & new LLE) delivery date 03/15/2024. Patient has a history of previous Lt BKA in 2019. Patient reports being able to stand at sink with UE support. Patient has new prosthesis with vacuum pin suction on both LEs which is new system that he is not familiar with. Patient has progressed wear of Rt prosthesis from 1 hour 2x/day up to 3 hrs, 2x/ day with minimal discomfort.   PAIN:  Are you having pain? Not rated, though some soreness noted in Rt residual limb.  PRECAUTIONS: None  WEIGHT BEARING RESTRICTIONS: No  FALLS: Has patient fallen in last 6 months? No  LIVING ENVIRONMENT: Lives with: lives with their spouse Lives  in: House Home Access: Ramped entrance or by garage which is one step Home layout: Two level, 1/2 bath on main level (not accessible with w/c), and Bed/bath upstairs Stairs: Yes: Internal: 16 steps; on left going up Has following equipment at home: Single point cane, Environmental Consultant - 2 wheeled, Wheelchair (manual), shower chair, and Tour manager  OCCUPATION: retired, works as art therapist on occasion  PLOF: modified independence  PATIENT GOALS: function with both prostheses at community level, doing yard corporate treasurer   MD appt: follow up 04/30/2024   OBJECTIVE:  Patient-Specific Activity Scoring Scheme  0 represents "unable to perform." 10 represents "able to perform at prior level. 0 1 2 3 4 5 6 7 8 9  10 (Date and Score)  Activity Eval  03/20/24  04/25/2024 06/06/2024  1.  walking 0   4 8  2.  standing ADLs  0  8 9  3. drive 0 0 0  4.stairs  0 7 9  5. Yardwork/handyman work 0 6 9  Score 0 5 7   Total score = sum of the activity scores/number of activities Minimum detectable change (90%CI) for average score = 2 points Minimum detectable change (90%CI) for single activity score = 3 points  COGNITION: Overall cognitive status: Within functional limits for tasks assessed   SENSATION: WFL  POSTURE: rounded shoulders, forward head, flexed trunk , and weight shift left  LOWER EXTREMITY ROM:  ROM P:passive  A:active Right eval Left eval  Hip flexion    Hip extension    Hip abduction    Hip adduction    Hip internal rotation    Hip external rotation    Knee flexion    Knee extension Seated  P: -5* Seated  P: 0*  Ankle dorsiflexion    Ankle plantarflexion    Ankle inversion    Ankle eversion     (Blank rows = not tested)  LOWER EXTREMITY MMT:  MMT Right eval Left eval  Hip flexion    Hip extension 3-/5 4-/5  Hip abduction 3/5 4-/5  Hip adduction    Hip internal rotation    Hip external rotation    Knee flexion 3/5   Knee extension 3/5   At  Evaluation all strength testing is grossly seated and functionally standing / gait. (Blank rows = not tested)  TRANSFERS: Sit to stand: Mod A, dependent on heavy BUE support 18 chair with armrests, use RW to stabilize to stand upright Stand to sit: Min A with BUE support on armrests to 18 chair, requires RW for stability Lateral scoot transfers: SBA with BUE support and 2 chairs touching same height  FUNCTIONAL TESTs:  Pt requires minA with heavy BUE support on RW for static standing balance.   GAIT: Gait pattern: step to pattern, decreased step length- Right, decreased step length- Left, decreased stance time- Right, decreased hip/knee flexion- Right, Right hip hike, knee flexed in stance- Right, lateral hip instability, trunk flexed, and wide BOS Distance walked: 22' Assistive device utilized: Environmental Consultant - 2 wheeled Level of assistance: Mod A  Gait velocity: 06/06/2024 SS with quad cane: 1.76 ft/s FAC with quad cane: 2.42 ft/s SS without AD: 2.00 ft/s FAC without AD: 2.74 ft/s   CURRENT PROSTHETIC WEAR ASSESSMENT: 03/20/2024  Patient is dependent with: skin check, residual limb care, prosthetic cleaning, ply sock cleaning, correct ply sock adjustment, proper wear schedule/adjustment, and proper weight-bearing schedule/adjustment Donning prosthesis: Mod A Doffing prosthesis: Mod A Prosthetic wear tolerance: progressed up to 3 hours, 2x/day for last day,  he has worn right prosthesis daily since delivery 6 days ago.  He is wearing Left prosthesis all awake hours.  Prosthetic weight bearing tolerance: pt tolerated standing & gait with RW support for 5 min total with no c/o limb pain.  Edema: RLE pitting with 5-second capillary refill.  None noted in LLE. Residual limb condition:   Right: bone length 7, limb length 8, scar healed, no hair growth, normal skin color and temperature, 1 medial wound 2mm which is clean with no signs of infection, cylindrical shape  Left: conical shape, shrunk  with bony landmarks prominent, dark & callused pressure areas at fibula head, tibial tuberosity and lateral tibial plateau.  Prosthetic description: total contact socket with backing pin suction, dynamic response feet   [facility] PT Assessment - 04/25/24 0001       Lars  Balance Test   Sit to Stand Able to stand  independently using hands    Standing Unsupported Able to stand 30 seconds unsupported    Sitting with Back Unsupported but Feet Supported on Floor or Stool Able to sit safely and securely 2 minutes    Stand to Sit Controls descent by using hands    Transfers Able to transfer safely, definite need of hands    Standing Unsupported with Eyes Closed Able to stand 10 seconds with supervision    Standing Unsupported with Feet Together Able to place feet together independently but unable to hold for 30 seconds    From Standing, Reach Forward with Outstretched Arm Can reach forward >12 cm safely (5)    From Standing Position, Pick up Object from Floor Able to pick up shoe, needs supervision    From Standing Position, Turn to Look Behind Over each Shoulder Needs supervision when turning    Turn 360 Degrees Needs close supervision or verbal cueing    Standing Unsupported, Alternately Place Feet on Step/Stool Able to complete >2 steps/needs minimal assist    Standing Unsupported, One Foot in Front Able to take small step independently and hold 30 seconds    Standing on One Leg Tries to lift leg/unable to hold 3 seconds but remains standing independently    Total Score 32    Berg comment: intermittent CGA throughout with RW use in between activities            [facility] PT Assessment - 06/06/24 0001       Berg Balance Test   Sit to Stand Able to stand  independently using hands    Standing Unsupported Able to stand 2 minutes with supervision    Sitting with Back Unsupported but Feet Supported on Floor or Stool Able to sit safely and securely 2 minutes    Stand to Sit Controls descent by using  hands    Transfers Able to transfer safely, definite need of hands    Standing Unsupported with Eyes Closed Able to stand 10 seconds with supervision    Standing Unsupported with Feet Together Able to place feet together independently but unable to hold for 30 seconds    From Standing, Reach Forward with Outstretched Arm Can reach forward >12 cm safely (5)    From Standing Position, Pick up Object from Floor Able to pick up shoe, needs supervision    From Standing Position, Turn to Look Behind Over each Shoulder Needs supervision when turning    Turn 360 Degrees Able to turn 360 degrees safely but slowly    Standing Unsupported, Alternately Place Feet on Step/Stool Able to complete >2 steps/needs minimal assist    Standing Unsupported, One Foot in [employer] balance while stepping or standing    Standing on One Leg Unable to try or needs assist to prevent fall    Total Score 31    Berg comment: intermittent CGA throughout with use of unilat quad cane between         Patient able to intermittently perform some BERG activities with quad cane without requirement of supervision, though heavily relying on quad cane for stability (especially with increased single leg activities)    TODAY'S TREATMENT:  DATE:  06/18/2024 Prosthetic Care with bilateral BKA prostheses: PT worked on ambulating normal base of support (2-4) initially with line of floor. Then line in //bars working on pelvic weight shift to stance LE without lateral trunk lean.  End of session pt ambulated without device with narrow 4 base.    Neuromuscular Re-education:  working on balance In //bars stepping on AirEx mat and contralateral LE stepping from floor to 8 step & back to floor. Visual cues for narrow step for LE stepping. 10 reps ea LE with BUE support //bars.   Standing on floor - step position  (forward heel past back toes with 4 base bw) 30 sec with frequent intermittent touch //bars Rocker board - initially with square board 2 pivots and progressed to round board 1 pivot.  Feet in step position with pelvic weight shift bw feet.  1 min RLE in front & 1 min LLE in front on both boards.   Standing bilateral stance 4 width eyes closed on floor 30 sec with frequent touch //bars.  Standing bilateral stance 4 width eyes open on foam 30 sec with frequent touch //bars.  Step up, over & down on BOSU round side up with 2 step approach for momentum with BUE support //bars 5 reps with ea LE on BOSU.  Focus on narrow stance during activity.     TREATMENT:                                                                                                                             DATE:  06/12/2024 Prosthetic Care with bilateral BKA prostheses: Pt amb 50' x 2 without device with supervision working on narrow (2-4) base with supervision.  Neuromuscular Re-education:  working on balance In //bars stepping on AirEx mat and contralateral LE stepping from floor to 8 step & back to floor. Alternate LEs. Progressed from BUE support to contralateral to ipsilateral UE.   Single limb stance on AirEx with contralateral LE stepping laterally across midline - anteriorly tapping 8 step with stance LE knee extension and posteriorly touching ground with stance knee flexed / unlocked.  10 reps with each LE with BUE support.   Standing on AirEx with BUE support on PT lateral upper arm (encouraging less weight bearing thru UE but enabling assist for stabilization) - head motions 5 reps eyes open & 5 reps eyes closed  - right/left, up/down & diagonals.  PT demo & verbal cues on corner with chair in front for safe environment for home.     TREATMENT:  DATE:  06/10/2024 Prosthetic Care with  bilateral BKA prostheses: Pt amb 50' x 2 without device with supervision working on narrow (2-4) base with supervision.  Neuromuscular Re-education:  working on balance PT educated on safe environment to challenge balance without placing him at risk of falls.  Recommending near sink/counter with chair back near. Pt & wife verbalized understanding.  Standing with one forefoot on cone / lower shelf of counter up to 10 sec.  Rolling tennis ball forward/back, right/left & circles CW / CCW with contralateral UE support.  PT added to HEP including HO with pt verbalizing understanding.  Straddle stance with WBing on each LE intermittent touch for stability.  PT recommended counting to 30 with freezing count when he needs to touch.  Pt verbalized understanding.  Standing alternating LEs blue theraband in figure 8 above knees stepping into abduction, extension and forward flexion.  Progressed UE support from BUE on counter, to one hand counter / other hand on cabinet at shoulder level (less weight bearing thru UE for support), then BUE on cabinet 2 reps ea UE position for all 3 motions.  PT added to HEP including HO which pt & wife verbalized understanding.    TREATMENT:                                                                                                                             DATE:  06/06/2024 Prosthetic Care with bilateral BKA prostheses: Patient performed ascending of ramp in clinic using a lateral stepping technique with quad cane in Rt UE and holding onto window sill with Lt UE while PT provided CGA. Patient then performed descending of ramp in clinic using fwd ambulation method with quad cane in Rt UE and intermittent use of Lt UE on window sill/wall while PT provided CGA. Patient endorsing inability to safely perform ramp in clinic without bilat UE support secondary to steep grade.  Patient performed resistive ambulation using quad cane with red tube resistance around waist with  SBA-CGA. Patient performed 4x20' of fwd ambulation with intermittent no AD use. Patient then performed 1x20' of backward ambulation with patient endorsing 30% of stability coming from quad cane in Lt UE.  No overt LOB throughout that required increase in physical assistance, however, had very small step lengths with backward ambulation and would require stutter step with fwd and back intermittently. PT discussed importance of strength and power with resistive ambulation. PT discussed POC and what to expect to work on in future session with patient and spouse.  Physical Performance:  BERG balance assessment with results noted above and discussed with patient and spouse  Gait velocity with and without AD with results noted above and discussed with patient and spouse    HOME EXERCISE PROGRAM: Access Code: [email or web address] URL: [email or web address] Date: 06/10/2024 Prepared by: Grayce Spatz  Exercises - Upright Stance at Door Frame Single Arm  - 1-3 x daily - 7 x weekly - 1 sets - 2 reps -  2 deep breathes hold - Upright Stance at Door Frame with Both Arms  - 1-3 x daily - 7 x weekly - 1 sets - 2 reps - 2 deep breathes hold - standing calf stretch with forefoot on small step or brick  - 1 x daily - 5-7 x weekly - 1 sets - 3 reps - 30 seconds hold - Seated Table Hamstring Stretch  - 1 x daily - 5-7 x weekly - 1 sets - 3 reps - 30 seconds hold - Thomas Stretch on Table  - 1 x daily - 5-7 x weekly - 1 sets - 3 reps - 30 seconds hold - Standing ball rolls front to back  - 1 x daily - 5 x weekly - 1 sets - 10 reps - 5 seconds hold - standing ball rolls inward / outward  - 1 x daily - 5 x weekly - 1 sets - 10 reps - 5 seconds hold - standing ball rolls in circles  - 1 x daily - 5 x weekly - 1 sets - 10 reps - 5 seconds hold - Hip Extension with Resistance Loop  - 1 x daily - 5 x weekly - 1 sets - 10 reps - 5 seconds hold - Standing Hip Abduction with Resistance at Ankles and Counter Support  - 1  x daily - 5 x weekly - 1 sets - 10 reps - 5 seconds hold - Standing Repeated Hip Flexion with Resistance  - 1 x daily - 5 x weekly - 1 sets - 10 reps - 5 seconds hold   ASSESSMENT:  CLINICAL IMPRESSION:  Patient improved his step width during gait with multiple activities facilitating muscles firing in this position.  Pt appears on target to meet LTGs by 07/04/24.   OBJECTIVE IMPAIRMENTS: Abnormal gait, decreased activity tolerance, decreased balance, decreased coordination, decreased endurance, decreased knowledge of use of DME, decreased mobility, difficulty walking, decreased ROM, decreased strength, impaired flexibility, improper body mechanics, postural dysfunction, and prosthetic dependency .   ACTIVITY LIMITATIONS: carrying, lifting, bending, sitting, standing, squatting, stairs, transfers, bathing, toileting, dressing, reach over head, hygiene/grooming, and locomotion level  PARTICIPATION LIMITATIONS: meal prep, cleaning, driving, shopping, community activity, occupation, and yard work  PERSONAL FACTORS: Age, Fitness, Past/current experiences, Time since onset of injury/illness/exacerbation, and 3+ comorbidities: see PMH are also affecting patient's functional outcome.   REHAB POTENTIAL: Good  CLINICAL DECISION MAKING: Evolving/moderate complexity  EVALUATION COMPLEXITY: Moderate   GOALS: Goals reviewed with patient? Yes  SHORT TERM GOALS: Target date: 04/19/2024  Patient donnes prosthesis modified independent & verbalizes proper cleaning. Baseline: SEE OBJECTIVE DATA Goal status: MET   04/16/2024 2.  Patient tolerates right prosthesis >10 hrs total /day without skin issues or limb pain. Baseline: SEE OBJECTIVE DATA Goal status:  MET 04/16/2024  3.  Patient able to reach 7 and look over both shoulders with RW support with supervision. Baseline: SEE OBJECTIVE DATA Goal status: Met 04/16/2024  4. Patient ambulates 82' with RW & prosthesis with CGA. Baseline: SEE OBJECTIVE  DATA Goal status: MET   04/16/2024  5. Patient transfers sit to/from stand 18 chair with armrest to RW with CGA.  Baseline: SEE OBJECTIVE DATA Goal status: MET   04/16/2024  LONG TERM GOALS: Target date: 07/04/2024  Patient demonstrates & verbalized understanding of prosthetic care to enable safe utilization of prostheses. Baseline: SEE OBJECTIVE DATA Goal status: ongoing      06/18/2024  Patient tolerates prostheses wear >90% of awake hours without skin or limb pain  issues. Baseline: SEE OBJECTIVE DATA Goal status: ongoing     06/18/2024  Patient ambulates >500' with LRAD and prostheses independently Baseline: SEE OBJECTIVE DATA Goal status: ongoing     06/18/2024  Patient negotiates ramps, curbs & stairs with single rail with prostheses with LRAD independently. Baseline: SEE OBJECTIVE DATA Goal status: ongoing       06/18/2024  Patient demonstrates & verbalizes lifting, carrying, pushing, pulling with prostheses only safely. Baseline: SEE OBJECTIVE DATA Goal status: ongoing    06/18/2024  6.  Patient reports Patient-Specific Activity Score improved the average >/= 6 to indicate improvement in functional activities.   Baseline: SEE OBJECTIVE DATA Goal status: GOAL MET, 06/06/2024  PLAN:  PT FREQUENCY: 2x/week  PT DURATION: 10 weeks  PLANNED INTERVENTIONS: 97164- PT Re-evaluation, 97750- Physical Performance Testing, 97110-Therapeutic exercises, 97530- Therapeutic activity, 97112- Neuromuscular re-education, 97535- Self Care, [id number]- Manual therapy, [phone]- Gait training, [phone]- Prosthetic Initial , [phone]- Orthotic/Prosthetic subsequent, Patient/Family education, Balance training, Stair training, Scar mobilization, and DME instructions  PLAN FOR NEXT SESSION:    Continue to work on standing balance facilitating strategies,  ambulation including narrow base without assistive device. LE strength with leg press or step up/down BOSU.    Grayce Spatz, PT, DPT 06/18/2024, 4:10  PM    Referring diagnosis? Z89.511 (ICD-10-CM) - Right below-knee amputee (has history left BKA so now Bil. BKAs) Treatment diagnosis? (if different than referring diagnosis) R26.89, M62.81, M25.661, R26.81 What was this (referring dx) caused by? [x]  Surgery []  Fall []  Ongoing issue []  Arthritis []  Other: ____________  Laterality: []  Rt []  Lt [x]  Both  Check all possible CPT codes:  *CHOOSE 10 OR LESS*    See Planned Interventions listed in the Plan section of the Evaluation.

## 2024-06-20 ENCOUNTER — Ambulatory Visit

## 2024-06-20 DIAGNOSIS — R2681 Unsteadiness on feet: Secondary | ICD-10-CM

## 2024-06-20 DIAGNOSIS — M25661 Stiffness of right knee, not elsewhere classified: Secondary | ICD-10-CM | POA: Diagnosis not present

## 2024-06-20 DIAGNOSIS — R2689 Other abnormalities of gait and mobility: Secondary | ICD-10-CM

## 2024-06-20 DIAGNOSIS — M6281 Muscle weakness (generalized): Secondary | ICD-10-CM | POA: Diagnosis not present

## 2024-06-20 NOTE — Therapy (Signed)
 OUTPATIENT PHYSICAL THERAPY PROSTHETIC TREATMENT   Patient Name: Kevin Bradford MRN: [id number] DOB:15-Apr-1946, 78 y.o., male Today's Date: 06/20/2024  END OF SESSION:  PT End of Session - 06/20/24 1331     Visit Number 24    Number of Visits 30    Date for Recertification  07/04/24    Authorization Type [employer]IC[employer]    Authorization Time Period NO COPAY  Approved  Authorization #[id number]  Tracking #[id number]  06/06/2024-07/04/2024  10 PT Visits    Authorization - Visit Number 4    Authorization - Number of Visits 10    Progress Note Due on Visit 30    PT Start Time 1347    PT Stop Time 1428    PT Time Calculation (min) 41 min    Equipment Utilized During Treatment Gait belt    Activity Tolerance Patient tolerated treatment well    Behavior During Therapy WFL for tasks assessed/performed             Past Medical History:  Diagnosis Date   Arthritis    joint pain (08/16/2017)   Below knee amputation (HCC)    Cellulitis and abscess of leg 10/18/2014   CKD (chronic kidney disease) stage 3, GFR 30-59 ml/min (HCC)    creatinine increases with antibiotics per pt, no known kidney disease per patient   Dehiscence of closure of skin, subsequent encounter    Diabetic foot ulcer (HCC)    left   Diabetic peripheral neuropathy (HCC)    GERD (gastroesophageal reflux disease)    12/26/2017- not this year.    Osteomyelitis (HCC)    left transmetatarsal    Stroke (HCC)    Type II diabetes mellitus [facility])    Past Surgical History:  Procedure Laterality Date   AMPUTATION Left 07/19/2017   Procedure: LEFT TRANSMETATARSAL AMPUTATION;  Surgeon: Harden Jerona GAILS, MD;  Location: [facility] OR;  Service: Orthopedics;  Laterality: Left;   AMPUTATION Left 08/16/2017   REVISION LEFT TRANSMETATARSAL AMPUTATION   AMPUTATION Left 11/22/2017   Procedure: LEFT BELOW KNEE AMPUTATION;  Surgeon: Harden Jerona GAILS, MD;  Location: [facility] OR;  Service: Orthopedics;  Laterality: Left;   AMPUTATION Right 12/20/2023    Procedure: AMPUTATION RIGHT BELOW KNEE;  Surgeon: Harden Jerona GAILS, MD;  Location: [facility] OR;  Service: Orthopedics;  Laterality: Right;   CATARACT EXTRACTION W/ INTRAOCULAR LENS  IMPLANT, BILATERAL  ~ 2016   EYE SURGERY Bilateral    catarct   I & D EXTREMITY Left 10/17/2014   Procedure: IRRIGATION AND DEBRIDEMENT EXTREMITY LEFT FOOT;  Surgeon: Jerona Harden GAILS, MD;  Location: [facility] OR;  Service: Orthopedics;  Laterality: Left;   IR ANGIO INTRA EXTRACRAN SEL COM CAROTID INNOMINATE BILAT MOD SED  05/07/2018   IR ANGIO VERTEBRAL SEL SUBCLAVIAN INNOMINATE BILAT MOD SED  05/07/2018   STUMP REVISION Left 08/16/2017   Procedure: REVISION LEFT TRANSMETATARSAL AMPUTATION;  Surgeon: Harden Jerona GAILS, MD;  Location: [facility] OR;  Service: Orthopedics;  Laterality: Left;   STUMP REVISION Left 12/27/2017   Procedure: LEFT BELOW KNEE AMPUTATION REVISION;  Surgeon: Harden Jerona GAILS, MD;  Location: [facility] OR;  Service: Orthopedics;  Laterality: Left;   TOE AMPUTATION Right 2013   5th toe   Patient Active Problem List   Diagnosis Date Noted   Coping style affecting medical condition 12/29/2023   Malnutrition of moderate degree 12/25/2023   Right below-knee amputee (HCC) 12/25/2023   Diabetic infection of right foot (HCC) 12/16/2023   History of stroke 12/16/2023  Acute osteomyelitis of right calcaneus (HCC) 12/16/2023   Cutaneous abscess of right foot 12/16/2023   DKA, type 2 (HCC) 12/08/2023   Hypokalemia 12/08/2023   S/P BKA (below knee amputation), left (HCC) 12/08/2023   Hyperosmolar hyperglycemic state (HHS) (HCC) 12/07/2023   Stroke (HCC)    Symptomatic bradycardia 05/06/2018   Postural dizziness with near syncope 05/06/2018   Labile blood glucose    Acute blood loss anemia    Labile blood pressure    CKD (chronic kidney disease) stage 2, GFR 60-89 ml/min    Type 2 diabetes mellitus with peripheral neuropathy (HCC)    Post-operative pain    Amputation of left lower extremity below knee upon examination (HCC)  11/24/2017   Amputated below knee, left (HCC) 11/22/2017   Osteomyelitis of left foot (HCC)    Dehiscence of amputation stump (HCC) 08/11/2017   S/P transmetatarsal amputation of foot, left (HCC) 07/19/2017   Subacute osteomyelitis, left ankle and foot (HCC) 07/17/2017   Diabetic ulcer of left foot associated with type 2 diabetes mellitus (HCC)    Diabetes mellitus with renal complications (HCC) 10/17/2014   Diabetes (HCC) 10/13/2014   Normocytic anemia 10/13/2014   Hyperkalemia 10/13/2014    PCP: Ransom Other, MD  REFERRING PROVIDER: Harden Jerona GAILS, MD  ONSET DATE: prosthesis delivery: 03/15/2024  REFERRING DIAG: S10.488 (ICD-10-CM) - Right below-knee amputee (HCC)   THERAPY DIAG:  Other abnormalities of gait and mobility  Unsteadiness on feet  Stiffness of right knee, not elsewhere classified  Muscle weakness (generalized)  Rationale for Evaluation and Treatment: Rehabilitation  SUBJECTIVE:   SUBJECTIVE STATEMENT:  Patient reports that wife is having some personal health troubles recently, but is doing well other than that. Pt accompanied by: significant other  PERTINENT HISTORY:  Right TTA 12/20/23, L TTA 2019, DM2, CKD3, PVD, arthritis, peripheral neuropathy, CVA,   Patient is a 78 y.o. male who arrives to PT session with new Rt BKA (12/20/2023) and prostheses (first RLE & new LLE) delivery date 03/15/2024. Patient has a history of previous Lt BKA in 2019. Patient reports being able to stand at sink with UE support. Patient has new prosthesis with vacuum pin suction on both LEs which is new system that he is not familiar with. Patient has progressed wear of Rt prosthesis from 1 hour 2x/day up to 3 hrs, 2x/ day with minimal discomfort.   PAIN:  Are you having pain? Not rated, though some soreness noted in Rt residual limb.  PRECAUTIONS: None  WEIGHT BEARING RESTRICTIONS: No  FALLS: Has patient fallen in last 6 months? No  LIVING ENVIRONMENT: Lives with: lives with  their spouse Lives in: House Home Access: Ramped entrance or by garage which is one step Home layout: Two level, 1/2 bath on main level (not accessible with w/c), and Bed/bath upstairs Stairs: Yes: Internal: 16 steps; on left going up Has following equipment at home: Single point cane, Environmental Consultant - 2 wheeled, Wheelchair (manual), shower chair, and Tour manager  OCCUPATION: retired, works as art therapist on occasion  PLOF: modified independence  PATIENT GOALS: function with both prostheses at community level, doing yard corporate treasurer   MD appt: follow up 04/30/2024   OBJECTIVE:  Patient-Specific Activity Scoring Scheme  0 represents "unable to perform." 10 represents "able to perform at prior level. 0 1 2 3 4 5 6 7 8 9  10 (Date and Score)  Activity Eval  03/20/24  04/25/2024 06/06/2024  1.  walking 0   4 8  2. standing  ADLs  0  8 9  3. drive 0 0 0  4.stairs  0 7 9  5. Yardwork/handyman work 0 6 9  Score 0 5 7   Total score = sum of the activity scores/number of activities Minimum detectable change (90%CI) for average score = 2 points Minimum detectable change (90%CI) for single activity score = 3 points  COGNITION: Overall cognitive status: Within functional limits for tasks assessed   SENSATION: WFL  POSTURE: rounded shoulders, forward head, flexed trunk , and weight shift left  LOWER EXTREMITY ROM:  ROM P:passive  A:active Right eval Left eval  Hip flexion    Hip extension    Hip abduction    Hip adduction    Hip internal rotation    Hip external rotation    Knee flexion    Knee extension Seated  P: -5* Seated  P: 0*  Ankle dorsiflexion    Ankle plantarflexion    Ankle inversion    Ankle eversion     (Blank rows = not tested)  LOWER EXTREMITY MMT:  MMT Right eval Left eval  Hip flexion    Hip extension 3-/5 4-/5  Hip abduction 3/5 4-/5  Hip adduction    Hip internal rotation    Hip external rotation    Knee flexion 3/5   Knee  extension 3/5   At Evaluation all strength testing is grossly seated and functionally standing / gait. (Blank rows = not tested)  TRANSFERS: Sit to stand: Mod A, dependent on heavy BUE support 18 chair with armrests, use RW to stabilize to stand upright Stand to sit: Min A with BUE support on armrests to 18 chair, requires RW for stability Lateral scoot transfers: SBA with BUE support and 2 chairs touching same height  FUNCTIONAL TESTs:  Pt requires minA with heavy BUE support on RW for static standing balance.   GAIT: Gait pattern: step to pattern, decreased step length- Right, decreased step length- Left, decreased stance time- Right, decreased hip/knee flexion- Right, Right hip hike, knee flexed in stance- Right, lateral hip instability, trunk flexed, and wide BOS Distance walked: 22' Assistive device utilized: Environmental Consultant - 2 wheeled Level of assistance: Mod A  Gait velocity: 06/06/2024 SS with quad cane: 1.76 ft/s FAC with quad cane: 2.42 ft/s SS without AD: 2.00 ft/s FAC without AD: 2.74 ft/s   CURRENT PROSTHETIC WEAR ASSESSMENT: 03/20/2024  Patient is dependent with: skin check, residual limb care, prosthetic cleaning, ply sock cleaning, correct ply sock adjustment, proper wear schedule/adjustment, and proper weight-bearing schedule/adjustment Donning prosthesis: Mod A Doffing prosthesis: Mod A Prosthetic wear tolerance: progressed up to 3 hours, 2x/day for last day,  he has worn right prosthesis daily since delivery 6 days ago.  He is wearing Left prosthesis all awake hours.  Prosthetic weight bearing tolerance: pt tolerated standing & gait with RW support for 5 min total with no c/o limb pain.  Edema: RLE pitting with 5-second capillary refill.  None noted in LLE. Residual limb condition:   Right: bone length 7, limb length 8, scar healed, no hair growth, normal skin color and temperature, 1 medial wound 2mm which is clean with no signs of infection, cylindrical shape  Left:  conical shape, shrunk with bony landmarks prominent, dark & callused pressure areas at fibula head, tibial tuberosity and lateral tibial plateau.  Prosthetic description: total contact socket with backing pin suction, dynamic response feet   [facility] PT Assessment - 04/25/24 0001       Berg Balance  Test   Sit to Stand Able to stand  independently using hands    Standing Unsupported Able to stand 30 seconds unsupported    Sitting with Back Unsupported but Feet Supported on Floor or Stool Able to sit safely and securely 2 minutes    Stand to Sit Controls descent by using hands    Transfers Able to transfer safely, definite need of hands    Standing Unsupported with Eyes Closed Able to stand 10 seconds with supervision    Standing Unsupported with Feet Together Able to place feet together independently but unable to hold for 30 seconds    From Standing, Reach Forward with Outstretched Arm Can reach forward >12 cm safely (5)    From Standing Position, Pick up Object from Floor Able to pick up shoe, needs supervision    From Standing Position, Turn to Look Behind Over each Shoulder Needs supervision when turning    Turn 360 Degrees Needs close supervision or verbal cueing    Standing Unsupported, Alternately Place Feet on Step/Stool Able to complete >2 steps/needs minimal assist    Standing Unsupported, One Foot in Front Able to take small step independently and hold 30 seconds    Standing on One Leg Tries to lift leg/unable to hold 3 seconds but remains standing independently    Total Score 32    Berg comment: intermittent CGA throughout with RW use in between activities            [facility] PT Assessment - 06/06/24 0001       Berg Balance Test   Sit to Stand Able to stand  independently using hands    Standing Unsupported Able to stand 2 minutes with supervision    Sitting with Back Unsupported but Feet Supported on Floor or Stool Able to sit safely and securely 2 minutes    Stand to Sit  Controls descent by using hands    Transfers Able to transfer safely, definite need of hands    Standing Unsupported with Eyes Closed Able to stand 10 seconds with supervision    Standing Unsupported with Feet Together Able to place feet together independently but unable to hold for 30 seconds    From Standing, Reach Forward with Outstretched Arm Can reach forward >12 cm safely (5)    From Standing Position, Pick up Object from Floor Able to pick up shoe, needs supervision    From Standing Position, Turn to Look Behind Over each Shoulder Needs supervision when turning    Turn 360 Degrees Able to turn 360 degrees safely but slowly    Standing Unsupported, Alternately Place Feet on Step/Stool Able to complete >2 steps/needs minimal assist    Standing Unsupported, One Foot in [employer] balance while stepping or standing    Standing on One Leg Unable to try or needs assist to prevent fall    Total Score 31    Berg comment: intermittent CGA throughout with use of unilat quad cane between         Patient able to intermittently perform some BERG activities with quad cane without requirement of supervision, though heavily relying on quad cane for stability (especially with increased single leg activities)    TODAY'S TREATMENT:  DATE:  06/20/2024 Neuro Re-Ed:  Lateral walks on foam pad with bilat UE use to challenge balance on compliant surface 1x6 down and back  Intermittent attempts to complete without UE support, though unable secondary to unsteadiness (no increased physical assist required from PT) Rocker board with bilat UE support to challenge weight shifting and balance 1x10 fwd/back with intermittent verbal cues provided for control of knee extension with LE placed back in stepping position  1x10 lateral rocker board with focus on hip and knee control  Stepping up  and over BOSU with dome side up using bilat UE support on parallel bars 1x6 with each LE on BOSU PT placing 6 step toward swing side when stepping over BOSU to decrease circumduction   Therapeutic Exercise:  Unilat leg press 1x10 with 50# each LE then performing 2x6 with 62#  Intermittent verbal cues provided for full knee extension (Lt>Rt)    TODAY'S TREATMENT:                                                                                                                             DATE:  06/18/2024 Prosthetic Care with bilateral BKA prostheses: PT worked on ambulating normal base of support (2-4) initially with line of floor. Then line in //bars working on pelvic weight shift to stance LE without lateral trunk lean.  End of session pt ambulated without device with narrow 4 base.    Neuromuscular Re-education:  working on balance In //bars stepping on AirEx mat and contralateral LE stepping from floor to 8 step & back to floor. Visual cues for narrow step for LE stepping. 10 reps ea LE with BUE support //bars.   Standing on floor - step position (forward heel past back toes with 4 base bw) 30 sec with frequent intermittent touch //bars Rocker board - initially with square board 2 pivots and progressed to round board 1 pivot.  Feet in step position with pelvic weight shift bw feet.  1 min RLE in front & 1 min LLE in front on both boards.   Standing bilateral stance 4 width eyes closed on floor 30 sec with frequent touch //bars.  Standing bilateral stance 4 width eyes open on foam 30 sec with frequent touch //bars.  Step up, over & down on BOSU round side up with 2 step approach for momentum with BUE support //bars 5 reps with ea LE on BOSU.  Focus on narrow stance during activity.     TREATMENT:  DATE:  06/12/2024 Prosthetic Care with bilateral BKA  prostheses: Pt amb 50' x 2 without device with supervision working on narrow (2-4) base with supervision.  Neuromuscular Re-education:  working on balance In //bars stepping on AirEx mat and contralateral LE stepping from floor to 8 step & back to floor. Alternate LEs. Progressed from BUE support to contralateral to ipsilateral UE.   Single limb stance on AirEx with contralateral LE stepping laterally across midline - anteriorly tapping 8 step with stance LE knee extension and posteriorly touching ground with stance knee flexed / unlocked.  10 reps with each LE with BUE support.   Standing on AirEx with BUE support on PT lateral upper arm (encouraging less weight bearing thru UE but enabling assist for stabilization) - head motions 5 reps eyes open & 5 reps eyes closed  - right/left, up/down & diagonals.  PT demo & verbal cues on corner with chair in front for safe environment for home.     TREATMENT:                                                                                                                             DATE:  06/10/2024 Prosthetic Care with bilateral BKA prostheses: Pt amb 50' x 2 without device with supervision working on narrow (2-4) base with supervision.  Neuromuscular Re-education:  working on balance PT educated on safe environment to challenge balance without placing him at risk of falls.  Recommending near sink/counter with chair back near. Pt & wife verbalized understanding.  Standing with one forefoot on cone / lower shelf of counter up to 10 sec.  Rolling tennis ball forward/back, right/left & circles CW / CCW with contralateral UE support.  PT added to HEP including HO with pt verbalizing understanding.  Straddle stance with WBing on each LE intermittent touch for stability.  PT recommended counting to 30 with freezing count when he needs to touch.  Pt verbalized understanding.  Standing alternating LEs blue theraband in figure 8 above knees stepping into  abduction, extension and forward flexion.  Progressed UE support from BUE on counter, to one hand counter / other hand on cabinet at shoulder level (less weight bearing thru UE for support), then BUE on cabinet 2 reps ea UE position for all 3 motions.  PT added to HEP including HO which pt & wife verbalized understanding.      HOME EXERCISE PROGRAM: Access Code: [email or web address] URL: [email or web address] Date: 06/10/2024 Prepared by: Grayce Spatz  Exercises - Upright Stance at Door Frame Single Arm  - 1-3 x daily - 7 x weekly - 1 sets - 2 reps - 2 deep breathes hold - Upright Stance at Door Frame with Both Arms  - 1-3 x daily - 7 x weekly - 1 sets - 2 reps - 2 deep breathes hold - standing calf stretch with forefoot on small step or brick  - 1 x daily - 5-7 x weekly - 1 sets -  3 reps - 30 seconds hold - Seated Table Hamstring Stretch  - 1 x daily - 5-7 x weekly - 1 sets - 3 reps - 30 seconds hold - Thomas Stretch on Table  - 1 x daily - 5-7 x weekly - 1 sets - 3 reps - 30 seconds hold - Standing ball rolls front to back  - 1 x daily - 5 x weekly - 1 sets - 10 reps - 5 seconds hold - standing ball rolls inward / outward  - 1 x daily - 5 x weekly - 1 sets - 10 reps - 5 seconds hold - standing ball rolls in circles  - 1 x daily - 5 x weekly - 1 sets - 10 reps - 5 seconds hold - Hip Extension with Resistance Loop  - 1 x daily - 5 x weekly - 1 sets - 10 reps - 5 seconds hold - Standing Hip Abduction with Resistance at Ankles and Counter Support  - 1 x daily - 5 x weekly - 1 sets - 10 reps - 5 seconds hold - Standing Repeated Hip Flexion with Resistance  - 1 x daily - 5 x weekly - 1 sets - 10 reps - 5 seconds hold   ASSESSMENT:  CLINICAL IMPRESSION:  Patient arrived to session noting difficulties with personal life that have kept him from performing HEP. Patient tolerated all activities well this date and is close to mod I with activities where he can use UE support for stability.  Patient endorsed wanting to focus on strengthening Lt to match Rt to increase stability and function. Patient will continue to benefit from skilled PT.   OBJECTIVE IMPAIRMENTS: Abnormal gait, decreased activity tolerance, decreased balance, decreased coordination, decreased endurance, decreased knowledge of use of DME, decreased mobility, difficulty walking, decreased ROM, decreased strength, impaired flexibility, improper body mechanics, postural dysfunction, and prosthetic dependency .   ACTIVITY LIMITATIONS: carrying, lifting, bending, sitting, standing, squatting, stairs, transfers, bathing, toileting, dressing, reach over head, hygiene/grooming, and locomotion level  PARTICIPATION LIMITATIONS: meal prep, cleaning, driving, shopping, community activity, occupation, and yard work  PERSONAL FACTORS: Age, Fitness, Past/current experiences, Time since onset of injury/illness/exacerbation, and 3+ comorbidities: see PMH are also affecting patient's functional outcome.   REHAB POTENTIAL: Good  CLINICAL DECISION MAKING: Evolving/moderate complexity  EVALUATION COMPLEXITY: Moderate   GOALS: Goals reviewed with patient? Yes  SHORT TERM GOALS: Target date: 04/19/2024  Patient donnes prosthesis modified independent & verbalizes proper cleaning. Baseline: SEE OBJECTIVE DATA Goal status: MET   04/16/2024 2.  Patient tolerates right prosthesis >10 hrs total /day without skin issues or limb pain. Baseline: SEE OBJECTIVE DATA Goal status:  MET 04/16/2024  3.  Patient able to reach 7 and look over both shoulders with RW support with supervision. Baseline: SEE OBJECTIVE DATA Goal status: Met 04/16/2024  4. Patient ambulates 34' with RW & prosthesis with CGA. Baseline: SEE OBJECTIVE DATA Goal status: MET   04/16/2024  5. Patient transfers sit to/from stand 18 chair with armrest to RW with CGA.  Baseline: SEE OBJECTIVE DATA Goal status: MET   04/16/2024  LONG TERM GOALS: Target date:  07/04/2024  Patient demonstrates & verbalized understanding of prosthetic care to enable safe utilization of prostheses. Baseline: SEE OBJECTIVE DATA Goal status: ongoing      06/18/2024  Patient tolerates prostheses wear >90% of awake hours without skin or limb pain issues. Baseline: SEE OBJECTIVE DATA Goal status: ongoing     06/18/2024  Patient ambulates >500' with LRAD and  prostheses independently Baseline: SEE OBJECTIVE DATA Goal status: ongoing     06/18/2024  Patient negotiates ramps, curbs & stairs with single rail with prostheses with LRAD independently. Baseline: SEE OBJECTIVE DATA Goal status: ongoing       06/18/2024  Patient demonstrates & verbalizes lifting, carrying, pushing, pulling with prostheses only safely. Baseline: SEE OBJECTIVE DATA Goal status: ongoing    06/18/2024  6.  Patient reports Patient-Specific Activity Score improved the average >/= 6 to indicate improvement in functional activities.   Baseline: SEE OBJECTIVE DATA Goal status: GOAL MET, 06/06/2024  PLAN:  PT FREQUENCY: 2x/week  PT DURATION: 10 weeks  PLANNED INTERVENTIONS: 97164- PT Re-evaluation, 97750- Physical Performance Testing, 97110-Therapeutic exercises, 97530- Therapeutic activity, 97112- Neuromuscular re-education, 97535- Self Care, 02859- Manual therapy, [phone]- Gait training, [phone]- Prosthetic Initial , [phone]- Orthotic/Prosthetic subsequent, Patient/Family education, Balance training, Stair training, Scar mobilization, and DME instructions  PLAN FOR NEXT SESSION:     Continue to work on standing balance facilitating strategies,  ambulation including narrow base without assistive device. LE strength with leg press or step up/down BOSU.   Susannah Daring, PT, DPT 06/20/24 3:52 PM      Referring diagnosis? Z89.511 (ICD-10-CM) - Right below-knee amputee (has history left BKA so now Bil. BKAs) Treatment diagnosis? (if different than referring diagnosis) R26.89, M62.81, M25.661, R26.81 What  was this (referring dx) caused by? [x]  Surgery []  Fall []  Ongoing issue []  Arthritis []  Other: ____________  Laterality: []  Rt []  Lt [x]  Both  Check all possible CPT codes:  *CHOOSE 10 OR LESS*    See Planned Interventions listed in the Plan section of the Evaluation.

## 2024-06-24 ENCOUNTER — Encounter: Admitting: Physical Therapy

## 2024-06-24 NOTE — Therapy (Incomplete)
 OUTPATIENT PHYSICAL THERAPY PROSTHETIC TREATMENT   Patient Name: Kevin Bradford MRN: [id number] DOB:02-23-1946, 78 y.o., male Today's Date: 06/24/2024  END OF SESSION:       Past Medical History:  Diagnosis Date   Arthritis    joint pain (08/16/2017)   Below knee amputation (HCC)    Cellulitis and abscess of leg 10/18/2014   CKD (chronic kidney disease) stage 3, GFR 30-59 ml/min (HCC)    creatinine increases with antibiotics per pt, no known kidney disease per patient   Dehiscence of closure of skin, subsequent encounter    Diabetic foot ulcer (HCC)    left   Diabetic peripheral neuropathy (HCC)    GERD (gastroesophageal reflux disease)    12/26/2017- not this year.    Osteomyelitis (HCC)    left transmetatarsal    Stroke (HCC)    Type II diabetes mellitus [facility])    Past Surgical History:  Procedure Laterality Date   AMPUTATION Left 07/19/2017   Procedure: LEFT TRANSMETATARSAL AMPUTATION;  Surgeon: Harden Jerona GAILS, MD;  Location: [facility] OR;  Service: Orthopedics;  Laterality: Left;   AMPUTATION Left 08/16/2017   REVISION LEFT TRANSMETATARSAL AMPUTATION   AMPUTATION Left 11/22/2017   Procedure: LEFT BELOW KNEE AMPUTATION;  Surgeon: Harden Jerona GAILS, MD;  Location: [facility] OR;  Service: Orthopedics;  Laterality: Left;   AMPUTATION Right 12/20/2023   Procedure: AMPUTATION RIGHT BELOW KNEE;  Surgeon: Harden Jerona GAILS, MD;  Location: [facility] OR;  Service: Orthopedics;  Laterality: Right;   CATARACT EXTRACTION W/ INTRAOCULAR LENS  IMPLANT, BILATERAL  ~ 2016   EYE SURGERY Bilateral    catarct   I & D EXTREMITY Left 10/17/2014   Procedure: IRRIGATION AND DEBRIDEMENT EXTREMITY LEFT FOOT;  Surgeon: Jerona Harden GAILS, MD;  Location: [facility] OR;  Service: Orthopedics;  Laterality: Left;   IR ANGIO INTRA EXTRACRAN SEL COM CAROTID INNOMINATE BILAT MOD SED  05/07/2018   IR ANGIO VERTEBRAL SEL SUBCLAVIAN INNOMINATE BILAT MOD SED  05/07/2018   STUMP REVISION Left 08/16/2017   Procedure: REVISION LEFT TRANSMETATARSAL  AMPUTATION;  Surgeon: Harden Jerona GAILS, MD;  Location: [facility] OR;  Service: Orthopedics;  Laterality: Left;   STUMP REVISION Left 12/27/2017   Procedure: LEFT BELOW KNEE AMPUTATION REVISION;  Surgeon: Harden Jerona GAILS, MD;  Location: [facility] OR;  Service: Orthopedics;  Laterality: Left;   TOE AMPUTATION Right 2013   5th toe   Patient Active Problem List   Diagnosis Date Noted   Coping style affecting medical condition 12/29/2023   Malnutrition of moderate degree 12/25/2023   Right below-knee amputee (HCC) 12/25/2023   Diabetic infection of right foot (HCC) 12/16/2023   History of stroke 12/16/2023   Acute osteomyelitis of right calcaneus (HCC) 12/16/2023   Cutaneous abscess of right foot 12/16/2023   DKA, type 2 (HCC) 12/08/2023   Hypokalemia 12/08/2023   S/P BKA (below knee amputation), left (HCC) 12/08/2023   Hyperosmolar hyperglycemic state (HHS) (HCC) 12/07/2023   Stroke (HCC)    Symptomatic bradycardia 05/06/2018   Postural dizziness with near syncope 05/06/2018   Labile blood glucose    Acute blood loss anemia    Labile blood pressure    CKD (chronic kidney disease) stage 2, GFR 60-89 ml/min    Type 2 diabetes mellitus with peripheral neuropathy (HCC)    Post-operative pain    Amputation of left lower extremity below knee upon examination (HCC) 11/24/2017   Amputated below knee, left (HCC) 11/22/2017   Osteomyelitis of left foot (HCC)    Dehiscence of  amputation stump (HCC) 08/11/2017   S/P transmetatarsal amputation of foot, left (HCC) 07/19/2017   Subacute osteomyelitis, left ankle and foot (HCC) 07/17/2017   Diabetic ulcer of left foot associated with type 2 diabetes mellitus (HCC)    Diabetes mellitus with renal complications (HCC) 10/17/2014   Diabetes (HCC) 10/13/2014   Normocytic anemia 10/13/2014   Hyperkalemia 10/13/2014    PCP: Ransom Other, MD  REFERRING PROVIDER: Harden Jerona GAILS, MD  ONSET DATE: prosthesis delivery: 03/15/2024  REFERRING DIAG: S10.488 (ICD-10-CM) -  Right below-knee amputee (HCC)   THERAPY DIAG:  No diagnosis found.  Rationale for Evaluation and Treatment: Rehabilitation  SUBJECTIVE:   SUBJECTIVE STATEMENT:  ***   Patient reports that wife is having some personal health troubles recently, but is doing well other than that. Pt accompanied by: significant other  PERTINENT HISTORY:  Right TTA 12/20/23, L TTA 2019, DM2, CKD3, PVD, arthritis, peripheral neuropathy, CVA,   Patient is a 78 y.o. male who arrives to PT session with new Rt BKA (12/20/2023) and prostheses (first RLE & new LLE) delivery date 03/15/2024. Patient has a history of previous Lt BKA in 2019. Patient reports being able to stand at sink with UE support. Patient has new prosthesis with vacuum pin suction on both LEs which is new system that he is not familiar with. Patient has progressed wear of Rt prosthesis from 1 hour 2x/day up to 3 hrs, 2x/ day with minimal discomfort.   PAIN:  Are you having pain? Not rated, though some soreness noted in Rt residual limb.  PRECAUTIONS: None  WEIGHT BEARING RESTRICTIONS: No  FALLS: Has patient fallen in last 6 months? No  LIVING ENVIRONMENT: Lives with: lives with their spouse Lives in: House Home Access: Ramped entrance or by garage which is one step Home layout: Two level, 1/2 bath on main level (not accessible with w/c), and Bed/bath upstairs Stairs: Yes: Internal: 16 steps; on left going up Has following equipment at home: Single point cane, Environmental Consultant - 2 wheeled, Wheelchair (manual), shower chair, and Tour manager  OCCUPATION: retired, works as art therapist on occasion  PLOF: modified independence  PATIENT GOALS: function with both prostheses at community level, doing yard corporate treasurer   MD appt: follow up 04/30/2024   OBJECTIVE:  Patient-Specific Activity Scoring Scheme  0 represents "unable to perform." 10 represents "able to perform at prior level. 0 1 2 3 4 5 6 7 8 9  10 (Date and Score)  Activity  Eval  03/20/24  04/25/2024 06/06/2024  1.  walking 0   4 8  2. standing ADLs  0  8 9  3. drive 0 0 0  4.stairs  0 7 9  5. Yardwork/handyman work 0 6 9  Score 0 5 7   Total score = sum of the activity scores/number of activities Minimum detectable change (90%CI) for average score = 2 points Minimum detectable change (90%CI) for single activity score = 3 points  COGNITION: Overall cognitive status: Within functional limits for tasks assessed   SENSATION: WFL  POSTURE: rounded shoulders, forward head, flexed trunk , and weight shift left  LOWER EXTREMITY ROM:  ROM P:passive  A:active Right eval Left eval  Hip flexion    Hip extension    Hip abduction    Hip adduction    Hip internal rotation    Hip external rotation    Knee flexion    Knee extension Seated  P: -5* Seated  P: 0*  Ankle dorsiflexion  Ankle plantarflexion    Ankle inversion    Ankle eversion     (Blank rows = not tested)  LOWER EXTREMITY MMT:  MMT Right eval Left eval  Hip flexion    Hip extension 3-/5 4-/5  Hip abduction 3/5 4-/5  Hip adduction    Hip internal rotation    Hip external rotation    Knee flexion 3/5   Knee extension 3/5   At Evaluation all strength testing is grossly seated and functionally standing / gait. (Blank rows = not tested)  TRANSFERS: Sit to stand: Mod A, dependent on heavy BUE support 18 chair with armrests, use RW to stabilize to stand upright Stand to sit: Min A with BUE support on armrests to 18 chair, requires RW for stability Lateral scoot transfers: SBA with BUE support and 2 chairs touching same height  FUNCTIONAL TESTs:  Pt requires minA with heavy BUE support on RW for static standing balance.   GAIT: Gait pattern: step to pattern, decreased step length- Right, decreased step length- Left, decreased stance time- Right, decreased hip/knee flexion- Right, Right hip hike, knee flexed in stance- Right, lateral hip instability, trunk flexed, and wide  BOS Distance walked: 22' Assistive device utilized: Environmental Consultant - 2 wheeled Level of assistance: Mod A  Gait velocity: 06/06/2024 SS with quad cane: 1.76 ft/s FAC with quad cane: 2.42 ft/s SS without AD: 2.00 ft/s FAC without AD: 2.74 ft/s   CURRENT PROSTHETIC WEAR ASSESSMENT: 03/20/2024  Patient is dependent with: skin check, residual limb care, prosthetic cleaning, ply sock cleaning, correct ply sock adjustment, proper wear schedule/adjustment, and proper weight-bearing schedule/adjustment Donning prosthesis: Mod A Doffing prosthesis: Mod A Prosthetic wear tolerance: progressed up to 3 hours, 2x/day for last day,  he has worn right prosthesis daily since delivery 6 days ago.  He is wearing Left prosthesis all awake hours.  Prosthetic weight bearing tolerance: pt tolerated standing & gait with RW support for 5 min total with no c/o limb pain.  Edema: RLE pitting with 5-second capillary refill.  None noted in LLE. Residual limb condition:   Right: bone length 7, limb length 8, scar healed, no hair growth, normal skin color and temperature, 1 medial wound 2mm which is clean with no signs of infection, cylindrical shape  Left: conical shape, shrunk with bony landmarks prominent, dark & callused pressure areas at fibula head, tibial tuberosity and lateral tibial plateau.  Prosthetic description: total contact socket with backing pin suction, dynamic response feet   [facility] PT Assessment - 04/25/24 0001       Berg Balance Test   Sit to Stand Able to stand  independently using hands    Standing Unsupported Able to stand 30 seconds unsupported    Sitting with Back Unsupported but Feet Supported on Floor or Stool Able to sit safely and securely 2 minutes    Stand to Sit Controls descent by using hands    Transfers Able to transfer safely, definite need of hands    Standing Unsupported with Eyes Closed Able to stand 10 seconds with supervision    Standing Unsupported with Feet Together Able to  place feet together independently but unable to hold for 30 seconds    From Standing, Reach Forward with Outstretched Arm Can reach forward >12 cm safely (5)    From Standing Position, Pick up Object from Floor Able to pick up shoe, needs supervision    From Standing Position, Turn to Look Behind Over each Shoulder Needs supervision when turning  Turn 360 Degrees Needs close supervision or verbal cueing    Standing Unsupported, Alternately Place Feet on Step/Stool Able to complete >2 steps/needs minimal assist    Standing Unsupported, One Foot in Front Able to take small step independently and hold 30 seconds    Standing on One Leg Tries to lift leg/unable to hold 3 seconds but remains standing independently    Total Score 32    Berg comment: intermittent CGA throughout with RW use in between activities            [facility] PT Assessment - 06/06/24 0001       Berg Balance Test   Sit to Stand Able to stand  independently using hands    Standing Unsupported Able to stand 2 minutes with supervision    Sitting with Back Unsupported but Feet Supported on Floor or Stool Able to sit safely and securely 2 minutes    Stand to Sit Controls descent by using hands    Transfers Able to transfer safely, definite need of hands    Standing Unsupported with Eyes Closed Able to stand 10 seconds with supervision    Standing Unsupported with Feet Together Able to place feet together independently but unable to hold for 30 seconds    From Standing, Reach Forward with Outstretched Arm Can reach forward >12 cm safely (5)    From Standing Position, Pick up Object from Floor Able to pick up shoe, needs supervision    From Standing Position, Turn to Look Behind Over each Shoulder Needs supervision when turning    Turn 360 Degrees Able to turn 360 degrees safely but slowly    Standing Unsupported, Alternately Place Feet on Step/Stool Able to complete >2 steps/needs minimal assist    Standing Unsupported, One  Foot in [employer] balance while stepping or standing    Standing on One Leg Unable to try or needs assist to prevent fall    Total Score 31    Berg comment: intermittent CGA throughout with use of unilat quad cane between         Patient able to intermittently perform some BERG activities with quad cane without requirement of supervision, though heavily relying on quad cane for stability (especially with increased single leg activities)    TODAY'S TREATMENT:                                                                                                                             DATE:  06/24/2024 Neuro Re-Ed:  ***  Lateral walks on foam pad with bilat UE use to challenge balance on compliant surface 1x6 down and back  Intermittent attempts to complete without UE support, though unable secondary to unsteadiness (no increased physical assist required from PT) Rocker board with bilat UE support to challenge weight shifting and balance 1x10 fwd/back with intermittent verbal cues provided for control of knee extension with LE placed back in stepping position  1x10 lateral rocker board  with focus on hip and knee control  Stepping up and over BOSU with dome side up using bilat UE support on parallel bars 1x6 with each LE on BOSU PT placing 6 step toward swing side when stepping over BOSU to decrease circumduction   Therapeutic Exercise:  Unilat leg press 1x10 with 50# each LE then performing 2x6 with 62#  Intermittent verbal cues provided for full knee extension (Lt>Rt)   TREATMENT:                                                                                                                             DATE:  06/20/2024 Neuro Re-Ed:  Lateral walks on foam pad with bilat UE use to challenge balance on compliant surface 1x6 down and back  Intermittent attempts to complete without UE support, though unable secondary to unsteadiness (no increased physical assist required from PT) Rocker  board with bilat UE support to challenge weight shifting and balance 1x10 fwd/back with intermittent verbal cues provided for control of knee extension with LE placed back in stepping position  1x10 lateral rocker board with focus on hip and knee control  Stepping up and over BOSU with dome side up using bilat UE support on parallel bars 1x6 with each LE on BOSU PT placing 6 step toward swing side when stepping over BOSU to decrease circumduction   Therapeutic Exercise:  Unilat leg press 1x10 with 50# each LE then performing 2x6 with 62#  Intermittent verbal cues provided for full knee extension (Lt>Rt)   TREATMENT:                                                                                                                             DATE:  06/18/2024 Prosthetic Care with bilateral BKA prostheses: PT worked on ambulating normal base of support (2-4) initially with line of floor. Then line in //bars working on pelvic weight shift to stance LE without lateral trunk lean.  End of session pt ambulated without device with narrow 4 base.    Neuromuscular Re-education:  working on balance In //bars stepping on AirEx mat and contralateral LE stepping from floor to 8 step & back to floor. Visual cues for narrow step for LE stepping. 10 reps ea LE with BUE support //bars.   Standing on floor - step position (forward heel past back toes with 4 base bw) 30 sec with frequent intermittent touch //bars Rocker  board - initially with square board 2 pivots and progressed to round board 1 pivot.  Feet in step position with pelvic weight shift bw feet.  1 min RLE in front & 1 min LLE in front on both boards.   Standing bilateral stance 4 width eyes closed on floor 30 sec with frequent touch //bars.  Standing bilateral stance 4 width eyes open on foam 30 sec with frequent touch //bars.  Step up, over & down on BOSU round side up with 2 step approach for momentum with BUE support //bars 5 reps with ea LE on  BOSU.  Focus on narrow stance during activity.     TREATMENT:                                                                                                                             DATE:  06/12/2024 Prosthetic Care with bilateral BKA prostheses: Pt amb 50' x 2 without device with supervision working on narrow (2-4) base with supervision.  Neuromuscular Re-education:  working on balance In //bars stepping on AirEx mat and contralateral LE stepping from floor to 8 step & back to floor. Alternate LEs. Progressed from BUE support to contralateral to ipsilateral UE.   Single limb stance on AirEx with contralateral LE stepping laterally across midline - anteriorly tapping 8 step with stance LE knee extension and posteriorly touching ground with stance knee flexed / unlocked.  10 reps with each LE with BUE support.   Standing on AirEx with BUE support on PT lateral upper arm (encouraging less weight bearing thru UE but enabling assist for stabilization) - head motions 5 reps eyes open & 5 reps eyes closed  - right/left, up/down & diagonals.  PT demo & verbal cues on corner with chair in front for safe environment for home.      HOME EXERCISE PROGRAM: Access Code: [email or web address] URL: [email or web address] Date: 06/10/2024 Prepared by: Grayce Spatz  Exercises - Upright Stance at Door Frame Single Arm  - 1-3 x daily - 7 x weekly - 1 sets - 2 reps - 2 deep breathes hold - Upright Stance at Door Frame with Both Arms  - 1-3 x daily - 7 x weekly - 1 sets - 2 reps - 2 deep breathes hold - standing calf stretch with forefoot on small step or brick  - 1 x daily - 5-7 x weekly - 1 sets - 3 reps - 30 seconds hold - Seated Table Hamstring Stretch  - 1 x daily - 5-7 x weekly - 1 sets - 3 reps - 30 seconds hold - Thomas Stretch on Table  - 1 x daily - 5-7 x weekly - 1 sets - 3 reps - 30 seconds hold - Standing ball rolls front to back  - 1 x daily - 5 x weekly - 1 sets - 10 reps - 5 seconds  hold - standing ball rolls inward / outward  - 1 x daily - 5  x weekly - 1 sets - 10 reps - 5 seconds hold - standing ball rolls in circles  - 1 x daily - 5 x weekly - 1 sets - 10 reps - 5 seconds hold - Hip Extension with Resistance Loop  - 1 x daily - 5 x weekly - 1 sets - 10 reps - 5 seconds hold - Standing Hip Abduction with Resistance at Ankles and Counter Support  - 1 x daily - 5 x weekly - 1 sets - 10 reps - 5 seconds hold - Standing Repeated Hip Flexion with Resistance  - 1 x daily - 5 x weekly - 1 sets - 10 reps - 5 seconds hold   ASSESSMENT:  CLINICAL IMPRESSION:  ***  Patient arrived to session noting difficulties with personal life that have kept him from performing HEP. Patient tolerated all activities well this date and is close to mod I with activities where he can use UE support for stability. Patient endorsed wanting to focus on strengthening Lt to match Rt to increase stability and function. Patient will continue to benefit from skilled PT.   OBJECTIVE IMPAIRMENTS: Abnormal gait, decreased activity tolerance, decreased balance, decreased coordination, decreased endurance, decreased knowledge of use of DME, decreased mobility, difficulty walking, decreased ROM, decreased strength, impaired flexibility, improper body mechanics, postural dysfunction, and prosthetic dependency .   ACTIVITY LIMITATIONS: carrying, lifting, bending, sitting, standing, squatting, stairs, transfers, bathing, toileting, dressing, reach over head, hygiene/grooming, and locomotion level  PARTICIPATION LIMITATIONS: meal prep, cleaning, driving, shopping, community activity, occupation, and yard work  PERSONAL FACTORS: Age, Fitness, Past/current experiences, Time since onset of injury/illness/exacerbation, and 3+ comorbidities: see PMH are also affecting patient's functional outcome.   REHAB POTENTIAL: Good  CLINICAL DECISION MAKING: Evolving/moderate complexity  EVALUATION COMPLEXITY:  Moderate   GOALS: Goals reviewed with patient? Yes  SHORT TERM GOALS: Target date: 04/19/2024  Patient donnes prosthesis modified independent & verbalizes proper cleaning. Baseline: SEE OBJECTIVE DATA Goal status: MET   04/16/2024 2.  Patient tolerates right prosthesis >10 hrs total /day without skin issues or limb pain. Baseline: SEE OBJECTIVE DATA Goal status:  MET 04/16/2024  3.  Patient able to reach 7 and look over both shoulders with RW support with supervision. Baseline: SEE OBJECTIVE DATA Goal status: Met 04/16/2024  4. Patient ambulates 23' with RW & prosthesis with CGA. Baseline: SEE OBJECTIVE DATA Goal status: MET   04/16/2024  5. Patient transfers sit to/from stand 18 chair with armrest to RW with CGA.  Baseline: SEE OBJECTIVE DATA Goal status: MET   04/16/2024  LONG TERM GOALS: Target date: 07/04/2024  Patient demonstrates & verbalized understanding of prosthetic care to enable safe utilization of prostheses. Baseline: SEE OBJECTIVE DATA Goal status: ongoing      06/24/2024  Patient tolerates prostheses wear >90% of awake hours without skin or limb pain issues. Baseline: SEE OBJECTIVE DATA Goal status: ongoing     06/24/2024  Patient ambulates >500' with LRAD and prostheses independently Baseline: SEE OBJECTIVE DATA Goal status: ongoing     06/24/2024  Patient negotiates ramps, curbs & stairs with single rail with prostheses with LRAD independently. Baseline: SEE OBJECTIVE DATA Goal status: ongoing       06/24/2024  Patient demonstrates & verbalizes lifting, carrying, pushing, pulling with prostheses only safely. Baseline: SEE OBJECTIVE DATA Goal status: ongoing    06/24/2024  6.  Patient reports Patient-Specific Activity Score improved the average >/= 6 to indicate improvement in functional activities.   Baseline: SEE OBJECTIVE DATA Goal  status: GOAL MET, 06/06/2024  PLAN:  PT FREQUENCY: 2x/week  PT DURATION: 10 weeks  PLANNED INTERVENTIONS: 97164- PT  Re-evaluation, 97750- Physical Performance Testing, 97110-Therapeutic exercises, 97530- Therapeutic activity, 97112- Neuromuscular re-education, 97535- Self Care, 02859- Manual therapy, [phone]- Gait training, [phone]- Prosthetic Initial , [phone]- Orthotic/Prosthetic subsequent, Patient/Family education, Balance training, Stair training, Scar mobilization, and DME instructions  PLAN FOR NEXT SESSION:   ***  Continue to work on standing balance facilitating strategies,  ambulation including narrow base without assistive device. LE strength with leg press or step up/down BOSU.    Grayce Spatz, PT, DPT 06/24/2024, 7:58 AM     Referring diagnosis? Z89.511 (ICD-10-CM) - Right below-knee amputee (has history left BKA so now Bil. BKAs) Treatment diagnosis? (if different than referring diagnosis) R26.89, M62.81, M25.661, R26.81 What was this (referring dx) caused by? [x]  Surgery []  Fall []  Ongoing issue []  Arthritis []  Other: ____________  Laterality: []  Rt []  Lt [x]  Both  Check all possible CPT codes:  *CHOOSE 10 OR LESS*    See Planned Interventions listed in the Plan section of the Evaluation.

## 2024-06-26 ENCOUNTER — Encounter: Payer: Self-pay | Admitting: Physical Therapy

## 2024-06-26 ENCOUNTER — Ambulatory Visit: Admitting: Physical Therapy

## 2024-06-26 DIAGNOSIS — R2681 Unsteadiness on feet: Secondary | ICD-10-CM

## 2024-06-26 DIAGNOSIS — M6281 Muscle weakness (generalized): Secondary | ICD-10-CM

## 2024-06-26 DIAGNOSIS — M25661 Stiffness of right knee, not elsewhere classified: Secondary | ICD-10-CM

## 2024-06-26 DIAGNOSIS — R2689 Other abnormalities of gait and mobility: Secondary | ICD-10-CM

## 2024-06-26 NOTE — Therapy (Signed)
 OUTPATIENT PHYSICAL THERAPY PROSTHETIC TREATMENT   Patient Name: Kevin Bradford MRN: [id number] DOB:06-Feb-1946, 78 y.o., male Today's Date: 06/26/2024  END OF SESSION:  PT End of Session - 06/26/24 1341     Visit Number 25    Number of Visits 30    Date for Recertification  07/04/24    Authorization Type [employer]IC[employer]    Authorization Time Period NO COPAY  Approved  Authorization #[id number]  Tracking #[id number]  06/06/2024-07/04/2024  10 PT Visits    Authorization - Visit Number 5    Authorization - Number of Visits 10    Progress Note Due on Visit 30    PT Start Time 1345    PT Stop Time 1429    PT Time Calculation (min) 44 min    Equipment Utilized During Treatment Gait belt    Activity Tolerance Patient tolerated treatment well    Behavior During Therapy WFL for tasks assessed/performed          Past Medical History:  Diagnosis Date   Arthritis    joint pain (08/16/2017)   Below knee amputation (HCC)    Cellulitis and abscess of leg 10/18/2014   CKD (chronic kidney disease) stage 3, GFR 30-59 ml/min (HCC)    creatinine increases with antibiotics per pt, no known kidney disease per patient   Dehiscence of closure of skin, subsequent encounter    Diabetic foot ulcer (HCC)    left   Diabetic peripheral neuropathy (HCC)    GERD (gastroesophageal reflux disease)    12/26/2017- not this year.    Osteomyelitis (HCC)    left transmetatarsal    Stroke (HCC)    Type II diabetes mellitus [facility])    Past Surgical History:  Procedure Laterality Date   AMPUTATION Left 07/19/2017   Procedure: LEFT TRANSMETATARSAL AMPUTATION;  Surgeon: Harden Jerona GAILS, MD;  Location: [facility] OR;  Service: Orthopedics;  Laterality: Left;   AMPUTATION Left 08/16/2017   REVISION LEFT TRANSMETATARSAL AMPUTATION   AMPUTATION Left 11/22/2017   Procedure: LEFT BELOW KNEE AMPUTATION;  Surgeon: Harden Jerona GAILS, MD;  Location: [facility] OR;  Service: Orthopedics;  Laterality: Left;   AMPUTATION Right 12/20/2023    Procedure: AMPUTATION RIGHT BELOW KNEE;  Surgeon: Harden Jerona GAILS, MD;  Location: [facility] OR;  Service: Orthopedics;  Laterality: Right;   CATARACT EXTRACTION W/ INTRAOCULAR LENS  IMPLANT, BILATERAL  ~ 2016   EYE SURGERY Bilateral    catarct   I & D EXTREMITY Left 10/17/2014   Procedure: IRRIGATION AND DEBRIDEMENT EXTREMITY LEFT FOOT;  Surgeon: Jerona Harden GAILS, MD;  Location: [facility] OR;  Service: Orthopedics;  Laterality: Left;   IR ANGIO INTRA EXTRACRAN SEL COM CAROTID INNOMINATE BILAT MOD SED  05/07/2018   IR ANGIO VERTEBRAL SEL SUBCLAVIAN INNOMINATE BILAT MOD SED  05/07/2018   STUMP REVISION Left 08/16/2017   Procedure: REVISION LEFT TRANSMETATARSAL AMPUTATION;  Surgeon: Harden Jerona GAILS, MD;  Location: [facility] OR;  Service: Orthopedics;  Laterality: Left;   STUMP REVISION Left 12/27/2017   Procedure: LEFT BELOW KNEE AMPUTATION REVISION;  Surgeon: Harden Jerona GAILS, MD;  Location: [facility] OR;  Service: Orthopedics;  Laterality: Left;   TOE AMPUTATION Right 2013   5th toe   Patient Active Problem List   Diagnosis Date Noted   Coping style affecting medical condition 12/29/2023   Malnutrition of moderate degree 12/25/2023   Right below-knee amputee (HCC) 12/25/2023   Diabetic infection of right foot (HCC) 12/16/2023   History of stroke 12/16/2023   Acute osteomyelitis of  right calcaneus (HCC) 12/16/2023   Cutaneous abscess of right foot 12/16/2023   DKA, type 2 (HCC) 12/08/2023   Hypokalemia 12/08/2023   S/P BKA (below knee amputation), left (HCC) 12/08/2023   Hyperosmolar hyperglycemic state (HHS) (HCC) 12/07/2023   Stroke (HCC)    Symptomatic bradycardia 05/06/2018   Postural dizziness with near syncope 05/06/2018   Labile blood glucose    Acute blood loss anemia    Labile blood pressure    CKD (chronic kidney disease) stage 2, GFR 60-89 ml/min    Type 2 diabetes mellitus with peripheral neuropathy (HCC)    Post-operative pain    Amputation of left lower extremity below knee upon examination (HCC)  11/24/2017   Amputated below knee, left (HCC) 11/22/2017   Osteomyelitis of left foot (HCC)    Dehiscence of amputation stump (HCC) 08/11/2017   S/P transmetatarsal amputation of foot, left (HCC) 07/19/2017   Subacute osteomyelitis, left ankle and foot (HCC) 07/17/2017   Diabetic ulcer of left foot associated with type 2 diabetes mellitus (HCC)    Diabetes mellitus with renal complications (HCC) 10/17/2014   Diabetes (HCC) 10/13/2014   Normocytic anemia 10/13/2014   Hyperkalemia 10/13/2014    PCP: Kevin Other, MD  REFERRING PROVIDER: Harden Jerona GAILS, MD  ONSET DATE: prosthesis delivery: 03/15/2024  REFERRING DIAG: S10.488 (ICD-10-CM) - Right below-knee amputee (HCC)   THERAPY DIAG:  Other abnormalities of gait and mobility  Unsteadiness on feet  Stiffness of right knee, not elsewhere classified  Muscle weakness (generalized)  Rationale for Evaluation and Treatment: Rehabilitation  SUBJECTIVE:   SUBJECTIVE STATEMENT:  He stayed home with icy weather as it did not seem worth risk.   Pt accompanied by: significant other  PERTINENT HISTORY:  Right TTA 12/20/23, L TTA 2019, DM2, CKD3, PVD, arthritis, peripheral neuropathy, CVA,   Patient is a 78 y.o. male who arrives to PT session with new Rt BKA (12/20/2023) and prostheses (first RLE & new LLE) delivery date 03/15/2024. Patient has a history of previous Lt BKA in 2019. Patient reports being able to stand at sink with UE support. Patient has new prosthesis with vacuum pin suction on both LEs which is new system that he is not familiar with. Patient has progressed wear of Rt prosthesis from 1 hour 2x/day up to 3 hrs, 2x/ day with minimal discomfort.   PAIN:  Are you having pain? Not rated, though some soreness noted in Rt residual limb.  PRECAUTIONS: None  WEIGHT BEARING RESTRICTIONS: No  FALLS: Has patient fallen in last 6 months? No  LIVING ENVIRONMENT: Lives with: lives with their spouse Lives in: House Home Access:  Ramped entrance or by garage which is one step Home layout: Two level, 1/2 bath on main level (not accessible with w/c), and Bed/bath upstairs Stairs: Yes: Internal: 16 steps; on left going up Has following equipment at home: Single point cane, Environmental Consultant - 2 wheeled, Wheelchair (manual), shower chair, and Tour manager  OCCUPATION: retired, works as art therapist on occasion  PLOF: modified independence  PATIENT GOALS: function with both prostheses at community level, doing yard corporate treasurer   MD appt: follow up 04/30/2024   OBJECTIVE:  Patient-Specific Activity Scoring Scheme  0 represents unable to perform. 10 represents able to perform at prior level. 0 1 2 3 4 5 6 7 8 9  10 (Date and Score)  Activity Eval  03/20/24  04/25/2024 06/06/2024  1.  walking 0   4 8  2. standing ADLs  0  8 9  3. drive 0 0 0  4.stairs  0 7 9  5. Yardwork/handyman work 0 6 9  Score 0 5 7   Total score = sum of the activity scores/number of activities Minimum detectable change (90%CI) for average score = 2 points Minimum detectable change (90%CI) for single activity score = 3 points  COGNITION: Overall cognitive status: Within functional limits for tasks assessed   SENSATION: WFL  POSTURE: rounded shoulders, forward head, flexed trunk , and weight shift left  LOWER EXTREMITY ROM:  ROM P:passive  A:active Right eval Left eval  Hip flexion    Hip extension    Hip abduction    Hip adduction    Hip internal rotation    Hip external rotation    Knee flexion    Knee extension Seated  P: -5* Seated  P: 0*  Ankle dorsiflexion    Ankle plantarflexion    Ankle inversion    Ankle eversion     (Blank rows = not tested)  LOWER EXTREMITY MMT:  MMT Right eval Left eval  Hip flexion    Hip extension 3-/5 4-/5  Hip abduction 3/5 4-/5  Hip adduction    Hip internal rotation    Hip external rotation    Knee flexion 3/5   Knee extension 3/5   At Evaluation all strength  testing is grossly seated and functionally standing / gait. (Blank rows = not tested)  TRANSFERS: Sit to stand: Mod A, dependent on heavy BUE support 18 chair with armrests, use RW to stabilize to stand upright Stand to sit: Min A with BUE support on armrests to 18 chair, requires RW for stability Lateral scoot transfers: SBA with BUE support and 2 chairs touching same height  FUNCTIONAL TESTs:  Pt requires minA with heavy BUE support on RW for static standing balance.   GAIT: Gait pattern: step to pattern, decreased step length- Right, decreased step length- Left, decreased stance time- Right, decreased hip/knee flexion- Right, Right hip hike, knee flexed in stance- Right, lateral hip instability, trunk flexed, and wide BOS Distance walked: 22' Assistive device utilized: Environmental Consultant - 2 wheeled Level of assistance: Mod A  Gait velocity: 06/06/2024 SS with quad cane: 1.76 ft/s FAC with quad cane: 2.42 ft/s SS without AD: 2.00 ft/s FAC without AD: 2.74 ft/s   CURRENT PROSTHETIC WEAR ASSESSMENT: 03/20/2024  Patient is dependent with: skin check, residual limb care, prosthetic cleaning, ply sock cleaning, correct ply sock adjustment, proper wear schedule/adjustment, and proper weight-bearing schedule/adjustment Donning prosthesis: Mod A Doffing prosthesis: Mod A Prosthetic wear tolerance: progressed up to 3 hours, 2x/day for last day,  he has worn right prosthesis daily since delivery 6 days ago.  He is wearing Left prosthesis all awake hours.  Prosthetic weight bearing tolerance: pt tolerated standing & gait with RW support for 5 min total with no c/o limb pain.  Edema: RLE pitting with 5-second capillary refill.  None noted in LLE. Residual limb condition:   Right: bone length 7, limb length 8, scar healed, no hair growth, normal skin color and temperature, 1 medial wound 2mm which is clean with no signs of infection, cylindrical shape  Left: conical shape, shrunk with bony landmarks  prominent, dark & callused pressure areas at fibula head, tibial tuberosity and lateral tibial plateau.  Prosthetic description: total contact socket with backing pin suction, dynamic response feet   [facility] PT Assessment - 04/25/24 0001       Berg Balance Test   Sit to Stand Able  to stand  independently using hands    Standing Unsupported Able to stand 30 seconds unsupported    Sitting with Back Unsupported but Feet Supported on Floor or Stool Able to sit safely and securely 2 minutes    Stand to Sit Controls descent by using hands    Transfers Able to transfer safely, definite need of hands    Standing Unsupported with Eyes Closed Able to stand 10 seconds with supervision    Standing Unsupported with Feet Together Able to place feet together independently but unable to hold for 30 seconds    From Standing, Reach Forward with Outstretched Arm Can reach forward >12 cm safely (5)    From Standing Position, Pick up Object from Floor Able to pick up shoe, needs supervision    From Standing Position, Turn to Look Behind Over each Shoulder Needs supervision when turning    Turn 360 Degrees Needs close supervision or verbal cueing    Standing Unsupported, Alternately Place Feet on Step/Stool Able to complete >2 steps/needs minimal assist    Standing Unsupported, One Foot in Front Able to take small step independently and hold 30 seconds    Standing on One Leg Tries to lift leg/unable to hold 3 seconds but remains standing independently    Total Score 32    Berg comment: intermittent CGA throughout with RW use in between activities            [facility]: Lambert Rhonda W PT Assessment - 06/06/24 0001       Berg Balance Test   Sit to Stand Able to stand  independently using hands    Standing Unsupported Able to stand 2 minutes with supervision    Sitting with Back Unsupported but Feet Supported on Floor or Stool Able to sit safely and securely 2 minutes    Stand to Sit Controls descent by using hands    Transfers  Able to transfer safely, definite need of hands    Standing Unsupported with Eyes Closed Able to stand 10 seconds with supervision    Standing Unsupported with Feet Together Able to place feet together independently but unable to hold for 30 seconds    From Standing, Reach Forward with Outstretched Arm Can reach forward >12 cm safely (5)    From Standing Position, Pick up Object from Floor Able to pick up shoe, needs supervision    From Standing Position, Turn to Look Behind Over each Shoulder Needs supervision when turning    Turn 360 Degrees Able to turn 360 degrees safely but slowly    Standing Unsupported, Alternately Place Feet on Step/Stool Able to complete >2 steps/needs minimal assist    Standing Unsupported, One Foot in [employer] balance while stepping or standing    Standing on One Leg Unable to try or needs assist to prevent fall    Total Score 31    Berg comment: intermittent CGA throughout with use of unilat quad cane between         Patient able to intermittently perform some BERG activities with quad cane without requirement of supervision, though heavily relying on quad cane for stability (especially with increased single leg activities)    TODAY'S TREATMENT:  DATE:  06/26/2024 Prosthetic Care with bilateral BKA prostheses: PT demo & verbal cues on driving options with bilateral TTA prostheses.  PT recommended starting in middle of large empty parking lot.  Pt and wife verbalized understanding.   Neuro Re-Ed:  Lateral walks on foam pad with bilat UE use to challenge balance on compliant surface including transitioning on/off compliant surface. Stepping up and over BOSU with round side up using bilat UE support on parallel bars 10 reps with each LE on BOSU. Cues to step off close to line on floor to facilitate narrow base.   PT placing 6 step toward  swing side when stepping over BOSU to decrease circumduction  Standing on BOSU flat side up with bilat UE support to challenge weight shifting and balance Ant/post with feet in bilateral stance 10 reps Right/left with feet in bilateral stance 10 reps Circles CW & CCW with feet in bilateral stance 10 reps ea.   Ant/post with feet in step position 10 reps with ea LE forward.   Set up for safety at sink with chair back to his right & left with Airex (compliant surface) in between Stepping on & off Airex alternating lead LE with intermittent UE touch. SLS on Airex with contralateral LE tapping knee to counter & back to floor - requires UE support but focus on minimizing weight bearing.   Side stepping onto Airex from right 10 reps and from left 10 reps with touch on counter.     TREATMENT:                                                                                                                             DATE:  06/20/2024 Neuro Re-Ed:  Lateral walks on foam pad with bilat UE use to challenge balance on compliant surface 1x6 down and back  Intermittent attempts to complete without UE support, though unable secondary to unsteadiness (no increased physical assist required from PT) Rocker board with bilat UE support to challenge weight shifting and balance 1x10 fwd/back with intermittent verbal cues provided for control of knee extension with LE placed back in stepping position  1x10 lateral rocker board with focus on hip and knee control  Stepping up and over BOSU with dome side up using bilat UE support on parallel bars 1x6 with each LE on BOSU PT placing 6 step toward swing side when stepping over BOSU to decrease circumduction   Therapeutic Exercise:  Unilat leg press 1x10 with 50# each LE then performing 2x6 with 62#  Intermittent verbal cues provided for full knee extension (Lt>Rt)   TREATMENT:  DATE:  06/18/2024 Prosthetic Care with bilateral BKA prostheses: PT worked on ambulating normal base of support (2-4) initially with line of floor. Then line in //bars working on pelvic weight shift to stance LE without lateral trunk lean.  End of session pt ambulated without device with narrow 4 base.    Neuromuscular Re-education:  working on balance In //bars stepping on AirEx mat and contralateral LE stepping from floor to 8 step & back to floor. Visual cues for narrow step for LE stepping. 10 reps ea LE with BUE support //bars.   Standing on floor - step position (forward heel past back toes with 4 base bw) 30 sec with frequent intermittent touch //bars Rocker board - initially with square board 2 pivots and progressed to round board 1 pivot.  Feet in step position with pelvic weight shift bw feet.  1 min RLE in front & 1 min LLE in front on both boards.   Standing bilateral stance 4 width eyes closed on floor 30 sec with frequent touch //bars.  Standing bilateral stance 4 width eyes open on foam 30 sec with frequent touch //bars.  Step up, over & down on BOSU round side up with 2 step approach for momentum with BUE support //bars 5 reps with ea LE on BOSU.  Focus on narrow stance during activity.     TREATMENT:                                                                                                                             DATE:  06/12/2024 Prosthetic Care with bilateral BKA prostheses: Pt amb 50' x 2 without device with supervision working on narrow (2-4) base with supervision.  Neuromuscular Re-education:  working on balance In //bars stepping on AirEx mat and contralateral LE stepping from floor to 8 step & back to floor. Alternate LEs. Progressed from BUE support to contralateral to ipsilateral UE.   Single limb stance on AirEx with contralateral LE stepping laterally across midline - anteriorly tapping 8 step with stance LE knee  extension and posteriorly touching ground with stance knee flexed / unlocked.  10 reps with each LE with BUE support.   Standing on AirEx with BUE support on PT lateral upper arm (encouraging less weight bearing thru UE but enabling assist for stabilization) - head motions 5 reps eyes open & 5 reps eyes closed  - right/left, up/down & diagonals.  PT demo & verbal cues on corner with chair in front for safe environment for home.      HOME EXERCISE PROGRAM: Access Code: [email or web address] URL: [email or web address] Date: 06/10/2024 Prepared by: Grayce Spatz  Exercises - Upright Stance at Door Frame Single Arm  - 1-3 x daily - 7 x weekly - 1 sets - 2 reps - 2 deep breathes hold - Upright Stance at Door Frame with Both Arms  - 1-3 x daily - 7 x weekly - 1 sets - 2 reps - 2 deep breathes  hold - standing calf stretch with forefoot on small step or brick  - 1 x daily - 5-7 x weekly - 1 sets - 3 reps - 30 seconds hold - Seated Table Hamstring Stretch  - 1 x daily - 5-7 x weekly - 1 sets - 3 reps - 30 seconds hold - Thomas Stretch on Table  - 1 x daily - 5-7 x weekly - 1 sets - 3 reps - 30 seconds hold - Standing ball rolls front to back  - 1 x daily - 5 x weekly - 1 sets - 10 reps - 5 seconds hold - standing ball rolls inward / outward  - 1 x daily - 5 x weekly - 1 sets - 10 reps - 5 seconds hold - standing ball rolls in circles  - 1 x daily - 5 x weekly - 1 sets - 10 reps - 5 seconds hold - Hip Extension with Resistance Loop  - 1 x daily - 5 x weekly - 1 sets - 10 reps - 5 seconds hold - Standing Hip Abduction with Resistance at Ankles and Counter Support  - 1 x daily - 5 x weekly - 1 sets - 10 reps - 5 seconds hold - Standing Repeated Hip Flexion with Resistance  - 1 x daily - 5 x weekly - 1 sets - 10 reps - 5 seconds hold   ASSESSMENT:  CLINICAL IMPRESSION:  Patient appears to understand balance activities using compliant surfaces near sink. Patient appears will meet LTGs next week with  planned discharge.  Patient will continue to benefit from skilled PT.   OBJECTIVE IMPAIRMENTS: Abnormal gait, decreased activity tolerance, decreased balance, decreased coordination, decreased endurance, decreased knowledge of use of DME, decreased mobility, difficulty walking, decreased ROM, decreased strength, impaired flexibility, improper body mechanics, postural dysfunction, and prosthetic dependency .   ACTIVITY LIMITATIONS: carrying, lifting, bending, sitting, standing, squatting, stairs, transfers, bathing, toileting, dressing, reach over head, hygiene/grooming, and locomotion level  PARTICIPATION LIMITATIONS: meal prep, cleaning, driving, shopping, community activity, occupation, and yard work  PERSONAL FACTORS: Age, Fitness, Past/current experiences, Time since onset of injury/illness/exacerbation, and 3+ comorbidities: see PMH are also affecting patient's functional outcome.   REHAB POTENTIAL: Good  CLINICAL DECISION MAKING: Evolving/moderate complexity  EVALUATION COMPLEXITY: Moderate   GOALS: Goals reviewed with patient? Yes  SHORT TERM GOALS: Target date: 04/19/2024  Patient donnes prosthesis modified independent & verbalizes proper cleaning. Baseline: SEE OBJECTIVE DATA Goal status: MET   04/16/2024 2.  Patient tolerates right prosthesis >10 hrs total /day without skin issues or limb pain. Baseline: SEE OBJECTIVE DATA Goal status:  MET 04/16/2024  3.  Patient able to reach 7 and look over both shoulders with RW support with supervision. Baseline: SEE OBJECTIVE DATA Goal status: Met 04/16/2024  4. Patient ambulates 1' with RW & prosthesis with CGA. Baseline: SEE OBJECTIVE DATA Goal status: MET   04/16/2024  5. Patient transfers sit to/from stand 18 chair with armrest to RW with CGA.  Baseline: SEE OBJECTIVE DATA Goal status: MET   04/16/2024  LONG TERM GOALS: Target date: 07/04/2024  Patient demonstrates & verbalized understanding of prosthetic care to enable  safe utilization of prostheses. Baseline: SEE OBJECTIVE DATA Goal status: ongoing      06/26/2024  Patient tolerates prostheses wear >90% of awake hours without skin or limb pain issues. Baseline: SEE OBJECTIVE DATA Goal status: ongoing     06/26/2024  Patient ambulates >500' with LRAD and prostheses independently Baseline: SEE OBJECTIVE DATA Goal status:  ongoing     06/26/2024  Patient negotiates ramps, curbs & stairs with single rail with prostheses with LRAD independently. Baseline: SEE OBJECTIVE DATA Goal status: ongoing       06/26/2024  Patient demonstrates & verbalizes lifting, carrying, pushing, pulling with prostheses only safely. Baseline: SEE OBJECTIVE DATA Goal status: ongoing    06/26/2024  6.  Patient reports Patient-Specific Activity Score improved the average >/= 6 to indicate improvement in functional activities.   Baseline: SEE OBJECTIVE DATA Goal status: GOAL MET, 06/06/2024  PLAN:  PT FREQUENCY: 2x/week  PT DURATION: 10 weeks  PLANNED INTERVENTIONS: 97164- PT Re-evaluation, 97750- Physical Performance Testing, 97110-Therapeutic exercises, 97530- Therapeutic activity, 97112- Neuromuscular re-education, 97535- Self Care, [id number]- Manual therapy, [phone]- Gait training, [phone]- Prosthetic Initial , [phone]- Orthotic/Prosthetic subsequent, Patient/Family education, Balance training, Stair training, Scar mobilization, and DME instructions  PLAN FOR NEXT SESSION:    Continue to work on standing balance facilitating strategies,  ambulation including narrow base without assistive device. LE strength with leg press or step up/down BOSU.    Grayce Spatz, PT, DPT 06/26/2024, 6:04 PM     Referring diagnosis? Z89.511 (ICD-10-CM) - Right below-knee amputee (has history left BKA so now Bil. BKAs) Treatment diagnosis? (if different than referring diagnosis) R26.89, M62.81, M25.661, R26.81 What was this (referring dx) caused by? [x]  Surgery []  Fall []  Ongoing issue []   Arthritis []  Other: ____________  Laterality: []  Rt []  Lt [x]  Both  Check all possible CPT codes:  *CHOOSE 10 OR LESS*    See Planned Interventions listed in the Plan section of the Evaluation.

## 2024-06-27 ENCOUNTER — Ambulatory Visit: Admitting: Physical Therapy

## 2024-06-27 ENCOUNTER — Encounter: Payer: Self-pay | Admitting: Physical Therapy

## 2024-06-27 DIAGNOSIS — M6281 Muscle weakness (generalized): Secondary | ICD-10-CM | POA: Diagnosis not present

## 2024-06-27 DIAGNOSIS — M25661 Stiffness of right knee, not elsewhere classified: Secondary | ICD-10-CM | POA: Diagnosis not present

## 2024-06-27 DIAGNOSIS — R2689 Other abnormalities of gait and mobility: Secondary | ICD-10-CM | POA: Diagnosis not present

## 2024-06-27 DIAGNOSIS — R2681 Unsteadiness on feet: Secondary | ICD-10-CM | POA: Diagnosis not present

## 2024-06-27 NOTE — Therapy (Signed)
 OUTPATIENT PHYSICAL THERAPY PROSTHETIC TREATMENT   Patient Name: Kevin Bradford MRN: [id number] DOB:[birth year], 78 y.o., male Today's Date: 06/27/2024  END OF SESSION:  PT End of Session - 06/27/24 0927     Visit Number 26    Number of Visits 30    Date for Recertification  07/04/24    Authorization Type [employer]IC[employer]    Authorization Time Period NO COPAY  Approved  Authorization #[id number]  Tracking #[id number]  06/06/2024-07/04/2024  10 PT Visits    Authorization - Visit Number 6    Authorization - Number of Visits 10    Progress Note Due on Visit 30    PT Start Time 0927    PT Stop Time 1010    PT Time Calculation (min) 43 min    Equipment Utilized During Treatment Gait belt    Activity Tolerance Patient tolerated treatment well    Behavior During Therapy WFL for tasks assessed/performed           Past Medical History:  Diagnosis Date   Arthritis    joint pain (08/16/2017)   Below knee amputation (HCC)    Cellulitis and abscess of leg 10/18/2014   CKD (chronic kidney disease) stage 3, GFR 30-59 ml/min (HCC)    creatinine increases with antibiotics per pt, no known kidney disease per patient   Dehiscence of closure of skin, subsequent encounter    Diabetic foot ulcer (HCC)    left   Diabetic peripheral neuropathy (HCC)    GERD (gastroesophageal reflux disease)    12/26/2017- not this year.    Osteomyelitis (HCC)    left transmetatarsal    Stroke (HCC)    Type II diabetes mellitus [facility])    Past Surgical History:  Procedure Laterality Date   AMPUTATION Left 07/19/2017   Procedure: LEFT TRANSMETATARSAL AMPUTATION;  Surgeon: Harden Jerona GAILS, MD;  Location: [facility] OR;  Service: Orthopedics;  Laterality: Left;   AMPUTATION Left 08/16/2017   REVISION LEFT TRANSMETATARSAL AMPUTATION   AMPUTATION Left 11/22/2017   Procedure: LEFT BELOW KNEE AMPUTATION;  Surgeon: Harden Jerona GAILS, MD;  Location: [facility] OR;  Service: Orthopedics;  Laterality: Left;   AMPUTATION Right 12/20/2023    Procedure: AMPUTATION RIGHT BELOW KNEE;  Surgeon: Harden Jerona GAILS, MD;  Location: [facility] OR;  Service: Orthopedics;  Laterality: Right;   CATARACT EXTRACTION W/ INTRAOCULAR LENS  IMPLANT, BILATERAL  ~ 2016   EYE SURGERY Bilateral    catarct   I & D EXTREMITY Left 10/17/2014   Procedure: IRRIGATION AND DEBRIDEMENT EXTREMITY LEFT FOOT;  Surgeon: Jerona Harden GAILS, MD;  Location: [facility] OR;  Service: Orthopedics;  Laterality: Left;   IR ANGIO INTRA EXTRACRAN SEL COM CAROTID INNOMINATE BILAT MOD SED  05/07/2018   IR ANGIO VERTEBRAL SEL SUBCLAVIAN INNOMINATE BILAT MOD SED  05/07/2018   STUMP REVISION Left 08/16/2017   Procedure: REVISION LEFT TRANSMETATARSAL AMPUTATION;  Surgeon: Harden Jerona GAILS, MD;  Location: [facility] OR;  Service: Orthopedics;  Laterality: Left;   STUMP REVISION Left 12/27/2017   Procedure: LEFT BELOW KNEE AMPUTATION REVISION;  Surgeon: Harden Jerona GAILS, MD;  Location: [facility] OR;  Service: Orthopedics;  Laterality: Left;   TOE AMPUTATION Right 2013   5th toe   Patient Active Problem List   Diagnosis Date Noted   Coping style affecting medical condition 12/29/2023   Malnutrition of moderate degree 12/25/2023   Right below-knee amputee (HCC) 12/25/2023   Diabetic infection of right foot (HCC) 12/16/2023   History of stroke 12/16/2023   Acute osteomyelitis  of right calcaneus (HCC) 12/16/2023   Cutaneous abscess of right foot 12/16/2023   DKA, type 2 (HCC) 12/08/2023   Hypokalemia 12/08/2023   S/P BKA (below knee amputation), left (HCC) 12/08/2023   Hyperosmolar hyperglycemic state (HHS) (HCC) 12/07/2023   Stroke (HCC)    Symptomatic bradycardia 05/06/2018   Postural dizziness with near syncope 05/06/2018   Labile blood glucose    Acute blood loss anemia    Labile blood pressure    CKD (chronic kidney disease) stage 2, GFR 60-89 ml/min    Type 2 diabetes mellitus with peripheral neuropathy (HCC)    Post-operative pain    Amputation of left lower extremity below knee upon examination (HCC)  11/24/2017   Amputated below knee, left (HCC) 11/22/2017   Osteomyelitis of left foot (HCC)    Dehiscence of amputation stump (HCC) 08/11/2017   S/P transmetatarsal amputation of foot, left (HCC) 07/19/2017   Subacute osteomyelitis, left ankle and foot (HCC) 07/17/2017   Diabetic ulcer of left foot associated with type 2 diabetes mellitus (HCC)    Diabetes mellitus with renal complications (HCC) 10/17/2014   Diabetes (HCC) 10/13/2014   Normocytic anemia 10/13/2014   Hyperkalemia 10/13/2014    PCP: Ransom Other, MD  REFERRING PROVIDER: Harden Jerona GAILS, MD  ONSET DATE: prosthesis delivery: 03/15/2024  REFERRING DIAG: S10.488 (ICD-10-CM) - Right below-knee amputee (HCC)   THERAPY DIAG:  Other abnormalities of gait and mobility  Unsteadiness on feet  Stiffness of right knee, not elsewhere classified  Muscle weakness (generalized)  Rationale for Evaluation and Treatment: Rehabilitation  SUBJECTIVE:   SUBJECTIVE STATEMENT:  He was a little sore from yesterday's workout.   Pt accompanied by: significant other  PERTINENT HISTORY:  Right TTA 12/20/23, L TTA 2019, DM2, CKD3, PVD, arthritis, peripheral neuropathy, CVA,   Patient is a 78 y.o. male who arrives to PT session with new Rt BKA (12/20/2023) and prostheses (first RLE & new LLE) delivery date 03/15/2024. Patient has a history of previous Lt BKA in 2019. Patient reports being able to stand at sink with UE support. Patient has new prosthesis with vacuum pin suction on both LEs which is new system that he is not familiar with. Patient has progressed wear of Rt prosthesis from 1 hour 2x/day up to 3 hrs, 2x/ day with minimal discomfort.   PAIN:  Are you having pain? Not rated, though some soreness noted in Rt residual limb.  PRECAUTIONS: None  WEIGHT BEARING RESTRICTIONS: No  FALLS: Has patient fallen in last 6 months? No  LIVING ENVIRONMENT: Lives with: lives with their spouse Lives in: House Home Access: Ramped entrance or  by garage which is one step Home layout: Two level, 1/2 bath on main level (not accessible with w/c), and Bed/bath upstairs Stairs: Yes: Internal: 16 steps; on left going up Has following equipment at home: Single point cane, Environmental Consultant - 2 wheeled, Wheelchair (manual), shower chair, and Tour manager  OCCUPATION: retired, works as art therapist on occasion  PLOF: modified independence  PATIENT GOALS: function with both prostheses at community level, doing yard corporate treasurer   MD appt: follow up 04/30/2024   OBJECTIVE:  Patient-Specific Activity Scoring Scheme  0 represents unable to perform. 10 represents able to perform at prior level. 0 1 2 3 4 5 6 7 8 9  10 (Date and Score)  Activity Eval  03/20/24  04/25/2024 06/06/2024  1.  walking 0   4 8  2. standing ADLs  0  8 9  3. drive 0  0 0  4.stairs  0 7 9  5. Yardwork/handyman work 0 6 9  Score 0 5 7   Total score = sum of the activity scores/number of activities Minimum detectable change (90%CI) for average score = 2 points Minimum detectable change (90%CI) for single activity score = 3 points  COGNITION: Overall cognitive status: Within functional limits for tasks assessed   SENSATION: WFL  POSTURE: rounded shoulders, forward head, flexed trunk , and weight shift left  LOWER EXTREMITY ROM:  ROM P:passive  A:active Right eval Left eval  Hip flexion    Hip extension    Hip abduction    Hip adduction    Hip internal rotation    Hip external rotation    Knee flexion    Knee extension Seated  P: -5* Seated  P: 0*  Ankle dorsiflexion    Ankle plantarflexion    Ankle inversion    Ankle eversion     (Blank rows = not tested)  LOWER EXTREMITY MMT:  MMT Right eval Left eval  Hip flexion    Hip extension 3-/5 4-/5  Hip abduction 3/5 4-/5  Hip adduction    Hip internal rotation    Hip external rotation    Knee flexion 3/5   Knee extension 3/5   At Evaluation all strength testing is grossly seated  and functionally standing / gait. (Blank rows = not tested)  TRANSFERS: Sit to stand: Mod A, dependent on heavy BUE support 18 chair with armrests, use RW to stabilize to stand upright Stand to sit: Min A with BUE support on armrests to 18 chair, requires RW for stability Lateral scoot transfers: SBA with BUE support and 2 chairs touching same height  FUNCTIONAL TESTs:  Pt requires minA with heavy BUE support on RW for static standing balance.   GAIT: Gait pattern: step to pattern, decreased step length- Right, decreased step length- Left, decreased stance time- Right, decreased hip/knee flexion- Right, Right hip hike, knee flexed in stance- Right, lateral hip instability, trunk flexed, and wide BOS Distance walked: 22' Assistive device utilized: Environmental Consultant - 2 wheeled Level of assistance: Mod A  Gait velocity: 06/06/2024 SS with quad cane: 1.76 ft/s FAC with quad cane: 2.42 ft/s SS without AD: 2.00 ft/s FAC without AD: 2.74 ft/s   CURRENT PROSTHETIC WEAR ASSESSMENT: 03/20/2024  Patient is dependent with: skin check, residual limb care, prosthetic cleaning, ply sock cleaning, correct ply sock adjustment, proper wear schedule/adjustment, and proper weight-bearing schedule/adjustment Donning prosthesis: Mod A Doffing prosthesis: Mod A Prosthetic wear tolerance: progressed up to 3 hours, 2x/day for last day,  he has worn right prosthesis daily since delivery 6 days ago.  He is wearing Left prosthesis all awake hours.  Prosthetic weight bearing tolerance: pt tolerated standing & gait with RW support for 5 min total with no c/o limb pain.  Edema: RLE pitting with 5-second capillary refill.  None noted in LLE. Residual limb condition:   Right: bone length 7, limb length 8, scar healed, no hair growth, normal skin color and temperature, 1 medial wound 2mm which is clean with no signs of infection, cylindrical shape  Left: conical shape, shrunk with bony landmarks prominent, dark & callused  pressure areas at fibula head, tibial tuberosity and lateral tibial plateau.  Prosthetic description: total contact socket with backing pin suction, dynamic response feet   [facility] PT Assessment - 04/25/24 0001       Berg Balance Test   Sit to Stand Able to stand  independently using hands    Standing Unsupported Able to stand 30 seconds unsupported    Sitting with Back Unsupported but Feet Supported on Floor or Stool Able to sit safely and securely 2 minutes    Stand to Sit Controls descent by using hands    Transfers Able to transfer safely, definite need of hands    Standing Unsupported with Eyes Closed Able to stand 10 seconds with supervision    Standing Unsupported with Feet Together Able to place feet together independently but unable to hold for 30 seconds    From Standing, Reach Forward with Outstretched Arm Can reach forward >12 cm safely (5)    From Standing Position, Pick up Object from Floor Able to pick up shoe, needs supervision    From Standing Position, Turn to Look Behind Over each Shoulder Needs supervision when turning    Turn 360 Degrees Needs close supervision or verbal cueing    Standing Unsupported, Alternately Place Feet on Step/Stool Able to complete >2 steps/needs minimal assist    Standing Unsupported, One Foot in Front Able to take small step independently and hold 30 seconds    Standing on One Leg Tries to lift leg/unable to hold 3 seconds but remains standing independently    Total Score 32    Berg comment: intermittent CGA throughout with RW use in between activities            [facility] PT Assessment - 06/06/24 0001       Berg Balance Test   Sit to Stand Able to stand  independently using hands    Standing Unsupported Able to stand 2 minutes with supervision    Sitting with Back Unsupported but Feet Supported on Floor or Stool Able to sit safely and securely 2 minutes    Stand to Sit Controls descent by using hands    Transfers Able to transfer safely,  definite need of hands    Standing Unsupported with Eyes Closed Able to stand 10 seconds with supervision    Standing Unsupported with Feet Together Able to place feet together independently but unable to hold for 30 seconds    From Standing, Reach Forward with Outstretched Arm Can reach forward >12 cm safely (5)    From Standing Position, Pick up Object from Floor Able to pick up shoe, needs supervision    From Standing Position, Turn to Look Behind Over each Shoulder Needs supervision when turning    Turn 360 Degrees Able to turn 360 degrees safely but slowly    Standing Unsupported, Alternately Place Feet on Step/Stool Able to complete >2 steps/needs minimal assist    Standing Unsupported, One Foot in [employer] balance while stepping or standing    Standing on One Leg Unable to try or needs assist to prevent fall    Total Score 31    Berg comment: intermittent CGA throughout with use of unilat quad cane between         Patient able to intermittently perform some BERG activities with quad cane without requirement of supervision, though heavily relying on quad cane for stability (especially with increased single leg activities)    TODAY'S TREATMENT:  DATE:  06/27/2024 Therapeutic Exercise: PT verbally educated pt on well rounded fitness program to include exercises that address muscle endurance, flexibility, strength & balance. Goal would be to address each area at least 3x/wk and some exercises may address more than one area.  Pt & wife verbalized understanding.   PT demo & verbal cues on modifications for riding stationary bike with TTA prostheses. Pt able to ride Precor bike for 4 min level 1.  Neuro Re-Ed:  Lateral walks on foam pad with bilat UE use to challenge balance on compliant surface including transitioning on/off compliant surface. Stepping up and  over BOSU with round side up using bilat UE support on parallel bars 10 reps with each LE on BOSU. Cues to step off close to line on floor to facilitate narrow base.   PT placing 6 step toward swing side when stepping over BOSU to decrease circumduction  Standing on BOSU flat side up with bilat UE support to challenge weight shifting and balance Ant/post with feet in bilateral stance 10 reps Right/left with feet in bilateral stance 10 reps Circles CW & CCW with feet in bilateral stance 10 reps ea.   Partial squat with feet in bilateral stance 10 reps Ant/post with feet in step position 10 reps with ea LE forward.   Set up for safety at sink with chair back to his right & left with Airex (compliant surface) in between Stepping on & off Airex alternating lead LE with intermittent UE touch. SLS on Airex with contralateral LE tapping knee to counter & back to floor - requires UE support but focus on minimizing weight bearing.   Side stepping onto Airex from right 10 reps and from left 10 reps with touch on counter.      TREATMENT:                                                                                                                             DATE:  06/26/2024 Prosthetic Care with bilateral BKA prostheses: PT demo & verbal cues on driving options with bilateral TTA prostheses.  PT recommended starting in middle of large empty parking lot.  Pt and wife verbalized understanding.   Neuro Re-Ed:  Lateral walks on foam pad with bilat UE use to challenge balance on compliant surface including transitioning on/off compliant surface. Stepping up and over BOSU with round side up using bilat UE support on parallel bars 10 reps with each LE on BOSU. Cues to step off close to line on floor to facilitate narrow base.   PT placing 6 step toward swing side when stepping over BOSU to decrease circumduction  Standing on BOSU flat side up with bilat UE support to challenge weight shifting and  balance Ant/post with feet in bilateral stance 10 reps Right/left with feet in bilateral stance 10 reps Circles CW & CCW with feet in bilateral stance 10 reps ea.   Ant/post with feet in step position 10 reps  with ea LE forward.   Set up for safety at sink with chair back to his right & left with Airex (compliant surface) in between Stepping on & off Airex alternating lead LE with intermittent UE touch. SLS on Airex with contralateral LE tapping knee to counter & back to floor - requires UE support but focus on minimizing weight bearing.   Side stepping onto Airex from right 10 reps and from left 10 reps with touch on counter.     TREATMENT:                                                                                                                             DATE:  06/20/2024 Neuro Re-Ed:  Lateral walks on foam pad with bilat UE use to challenge balance on compliant surface 1x6 down and back  Intermittent attempts to complete without UE support, though unable secondary to unsteadiness (no increased physical assist required from PT) Rocker board with bilat UE support to challenge weight shifting and balance 1x10 fwd/back with intermittent verbal cues provided for control of knee extension with LE placed back in stepping position  1x10 lateral rocker board with focus on hip and knee control  Stepping up and over BOSU with dome side up using bilat UE support on parallel bars 1x6 with each LE on BOSU PT placing 6 step toward swing side when stepping over BOSU to decrease circumduction   Therapeutic Exercise:  Unilat leg press 1x10 with 50# each LE then performing 2x6 with 62#  Intermittent verbal cues provided for full knee extension (Lt>Rt)   TREATMENT:                                                                                                                             DATE:  06/18/2024 Prosthetic Care with bilateral BKA prostheses: PT worked on ambulating normal base of support  (2-4) initially with line of floor. Then line in //bars working on pelvic weight shift to stance LE without lateral trunk lean.  End of session pt ambulated without device with narrow 4 base.    Neuromuscular Re-education:  working on balance In //bars stepping on AirEx mat and contralateral LE stepping from floor to 8 step & back to floor. Visual cues for narrow step for LE stepping. 10 reps ea LE with BUE support //bars.   Standing on floor - step position (forward heel past back toes with  4 base bw) 30 sec with frequent intermittent touch //bars Rocker board - initially with square board 2 pivots and progressed to round board 1 pivot.  Feet in step position with pelvic weight shift bw feet.  1 min RLE in front & 1 min LLE in front on both boards.   Standing bilateral stance 4 width eyes closed on floor 30 sec with frequent touch //bars.  Standing bilateral stance 4 width eyes open on foam 30 sec with frequent touch //bars.  Step up, over & down on BOSU round side up with 2 step approach for momentum with BUE support //bars 5 reps with ea LE on BOSU.  Focus on narrow stance during activity.     HOME EXERCISE PROGRAM: Access Code: [email or web address] URL: [email or web address] Date: 06/10/2024 Prepared by: Grayce Spatz  Exercises - Upright Stance at Door Frame Single Arm  - 1-3 x daily - 7 x weekly - 1 sets - 2 reps - 2 deep breathes hold - Upright Stance at Door Frame with Both Arms  - 1-3 x daily - 7 x weekly - 1 sets - 2 reps - 2 deep breathes hold - standing calf stretch with forefoot on small step or brick  - 1 x daily - 5-7 x weekly - 1 sets - 3 reps - 30 seconds hold - Seated Table Hamstring Stretch  - 1 x daily - 5-7 x weekly - 1 sets - 3 reps - 30 seconds hold - Thomas Stretch on Table  - 1 x daily - 5-7 x weekly - 1 sets - 3 reps - 30 seconds hold - Standing ball rolls front to back  - 1 x daily - 5 x weekly - 1 sets - 10 reps - 5 seconds hold - standing ball rolls  inward / outward  - 1 x daily - 5 x weekly - 1 sets - 10 reps - 5 seconds hold - standing ball rolls in circles  - 1 x daily - 5 x weekly - 1 sets - 10 reps - 5 seconds hold - Hip Extension with Resistance Loop  - 1 x daily - 5 x weekly - 1 sets - 10 reps - 5 seconds hold - Standing Hip Abduction with Resistance at Ankles and Counter Support  - 1 x daily - 5 x weekly - 1 sets - 10 reps - 5 seconds hold - Standing Repeated Hip Flexion with Resistance  - 1 x daily - 5 x weekly - 1 sets - 10 reps - 5 seconds hold   ASSESSMENT:  CLINICAL IMPRESSION:  Patient appears to understand PT recommendations for ongoing fitness plan.   Patient will continue to benefit from skilled PT.   OBJECTIVE IMPAIRMENTS: Abnormal gait, decreased activity tolerance, decreased balance, decreased coordination, decreased endurance, decreased knowledge of use of DME, decreased mobility, difficulty walking, decreased ROM, decreased strength, impaired flexibility, improper body mechanics, postural dysfunction, and prosthetic dependency .   ACTIVITY LIMITATIONS: carrying, lifting, bending, sitting, standing, squatting, stairs, transfers, bathing, toileting, dressing, reach over head, hygiene/grooming, and locomotion level  PARTICIPATION LIMITATIONS: meal prep, cleaning, driving, shopping, community activity, occupation, and yard work  PERSONAL FACTORS: Age, Fitness, Past/current experiences, Time since onset of injury/illness/exacerbation, and 3+ comorbidities: see PMH are also affecting patient's functional outcome.   REHAB POTENTIAL: Good  CLINICAL DECISION MAKING: Evolving/moderate complexity  EVALUATION COMPLEXITY: Moderate   GOALS: Goals reviewed with patient? Yes  SHORT TERM GOALS: Target date: 04/19/2024  Patient donnes prosthesis modified independent & verbalizes proper cleaning.  Baseline: SEE OBJECTIVE DATA Goal status: MET   04/16/2024 2.  Patient tolerates right prosthesis >10 hrs total /day without skin  issues or limb pain. Baseline: SEE OBJECTIVE DATA Goal status:  MET 04/16/2024  3.  Patient able to reach 7 and look over both shoulders with RW support with supervision. Baseline: SEE OBJECTIVE DATA Goal status: Met 04/16/2024  4. Patient ambulates 55' with RW & prosthesis with CGA. Baseline: SEE OBJECTIVE DATA Goal status: MET   04/16/2024  5. Patient transfers sit to/from stand 18 chair with armrest to RW with CGA.  Baseline: SEE OBJECTIVE DATA Goal status: MET   04/16/2024  LONG TERM GOALS: Target date: 07/04/2024  Patient demonstrates & verbalized understanding of prosthetic care to enable safe utilization of prostheses. Baseline: SEE OBJECTIVE DATA Goal status: ongoing      06/26/2024  Patient tolerates prostheses wear >90% of awake hours without skin or limb pain issues. Baseline: SEE OBJECTIVE DATA Goal status: ongoing     06/26/2024  Patient ambulates >500' with LRAD and prostheses independently Baseline: SEE OBJECTIVE DATA Goal status: ongoing     06/26/2024  Patient negotiates ramps, curbs & stairs with single rail with prostheses with LRAD independently. Baseline: SEE OBJECTIVE DATA Goal status: ongoing       06/26/2024  Patient demonstrates & verbalizes lifting, carrying, pushing, pulling with prostheses only safely. Baseline: SEE OBJECTIVE DATA Goal status: ongoing    06/26/2024  6.  Patient reports Patient-Specific Activity Score improved the average >/= 6 to indicate improvement in functional activities.   Baseline: SEE OBJECTIVE DATA Goal status: GOAL MET, 06/06/2024  PLAN:  PT FREQUENCY: 2x/week  PT DURATION: 10 weeks  PLANNED INTERVENTIONS: 97164- PT Re-evaluation, 97750- Physical Performance Testing, 97110-Therapeutic exercises, 97530- Therapeutic activity, [id number]- Neuromuscular re-education, 97535- Self Care, [id number]- Manual therapy, [phone]- Gait training, [phone]- Prosthetic Initial , [phone]- Orthotic/Prosthetic subsequent, Patient/Family education,  Balance training, Stair training, Scar mobilization, and DME instructions  PLAN FOR NEXT SESSION:  check LTGs next week with anticipated discharge.  Continue to work on standing balance facilitating strategies,  ambulation including narrow base without assistive device. LE strength with leg press or step up/down BOSU.    Grayce Spatz, PT, DPT 06/27/2024, 11:58 AM     Referring diagnosis? Z89.511 (ICD-10-CM) - Right below-knee amputee (has history left BKA so now Bil. BKAs) Treatment diagnosis? (if different than referring diagnosis) R26.89, M62.81, M25.661, R26.81 What was this (referring dx) caused by? [x]  Surgery []  Fall []  Ongoing issue []  Arthritis []  Other: ____________  Laterality: []  Rt []  Lt [x]  Both  Check all possible CPT codes:  *CHOOSE 10 OR LESS*    See Planned Interventions listed in the Plan section of the Evaluation.

## 2024-07-01 ENCOUNTER — Encounter: Payer: Self-pay | Admitting: Physical Therapy

## 2024-07-01 ENCOUNTER — Ambulatory Visit: Admitting: Physical Therapy

## 2024-07-01 DIAGNOSIS — R2689 Other abnormalities of gait and mobility: Secondary | ICD-10-CM

## 2024-07-01 DIAGNOSIS — R2681 Unsteadiness on feet: Secondary | ICD-10-CM

## 2024-07-01 DIAGNOSIS — M6281 Muscle weakness (generalized): Secondary | ICD-10-CM

## 2024-07-01 DIAGNOSIS — M25661 Stiffness of right knee, not elsewhere classified: Secondary | ICD-10-CM

## 2024-07-01 NOTE — Therapy (Signed)
 OUTPATIENT PHYSICAL THERAPY PROSTHETIC TREATMENT   Patient Name: Kevin Bradford MRN: [id number] DOB:17-Oct-1945, 78 y.o., male Today's Date: 07/01/2024  END OF SESSION:  PT End of Session - 07/01/24 1346     Visit Number 27    Number of Visits 30    Date for Recertification  07/04/24    Authorization Type [employer]IC[employer]    Authorization Time Period NO COPAY  Approved  Authorization #[id number]  Tracking #[id number]  06/06/2024-07/04/2024  10 PT Visits    Authorization - Visit Number 7    Authorization - Number of Visits 10    Progress Note Due on Visit 30    PT Start Time 1345    PT Stop Time 1428    PT Time Calculation (min) 43 min    Equipment Utilized During Treatment Gait belt    Activity Tolerance Patient tolerated treatment well    Behavior During Therapy WFL for tasks assessed/performed            Past Medical History:  Diagnosis Date   Arthritis    joint pain (08/16/2017)   Below knee amputation (HCC)    Cellulitis and abscess of leg 10/18/2014   CKD (chronic kidney disease) stage 3, GFR 30-59 ml/min (HCC)    creatinine increases with antibiotics per pt, no known kidney disease per patient   Dehiscence of closure of skin, subsequent encounter    Diabetic foot ulcer (HCC)    left   Diabetic peripheral neuropathy (HCC)    GERD (gastroesophageal reflux disease)    12/26/2017- not this year.    Osteomyelitis (HCC)    left transmetatarsal    Stroke (HCC)    Type II diabetes mellitus [facility])    Past Surgical History:  Procedure Laterality Date   AMPUTATION Left 07/19/2017   Procedure: LEFT TRANSMETATARSAL AMPUTATION;  Surgeon: Harden Jerona GAILS, MD;  Location: [facility] OR;  Service: Orthopedics;  Laterality: Left;   AMPUTATION Left 08/16/2017   REVISION LEFT TRANSMETATARSAL AMPUTATION   AMPUTATION Left 11/22/2017   Procedure: LEFT BELOW KNEE AMPUTATION;  Surgeon: Harden Jerona GAILS, MD;  Location: [facility] OR;  Service: Orthopedics;  Laterality: Left;   AMPUTATION Right 12/20/2023    Procedure: AMPUTATION RIGHT BELOW KNEE;  Surgeon: Harden Jerona GAILS, MD;  Location: [facility] OR;  Service: Orthopedics;  Laterality: Right;   CATARACT EXTRACTION W/ INTRAOCULAR LENS  IMPLANT, BILATERAL  ~ 2016   EYE SURGERY Bilateral    catarct   I & D EXTREMITY Left 10/17/2014   Procedure: IRRIGATION AND DEBRIDEMENT EXTREMITY LEFT FOOT;  Surgeon: Jerona Harden GAILS, MD;  Location: [facility] OR;  Service: Orthopedics;  Laterality: Left;   IR ANGIO INTRA EXTRACRAN SEL COM CAROTID INNOMINATE BILAT MOD SED  05/07/2018   IR ANGIO VERTEBRAL SEL SUBCLAVIAN INNOMINATE BILAT MOD SED  05/07/2018   STUMP REVISION Left 08/16/2017   Procedure: REVISION LEFT TRANSMETATARSAL AMPUTATION;  Surgeon: Harden Jerona GAILS, MD;  Location: [facility] OR;  Service: Orthopedics;  Laterality: Left;   STUMP REVISION Left 12/27/2017   Procedure: LEFT BELOW KNEE AMPUTATION REVISION;  Surgeon: Harden Jerona GAILS, MD;  Location: [facility] OR;  Service: Orthopedics;  Laterality: Left;   TOE AMPUTATION Right 2013   5th toe   Patient Active Problem List   Diagnosis Date Noted   Coping style affecting medical condition 12/29/2023   Malnutrition of moderate degree 12/25/2023   Right below-knee amputee (HCC) 12/25/2023   Diabetic infection of right foot (HCC) 12/16/2023   History of stroke 12/16/2023   Acute  osteomyelitis of right calcaneus (HCC) 12/16/2023   Cutaneous abscess of right foot 12/16/2023   DKA, type 2 (HCC) 12/08/2023   Hypokalemia 12/08/2023   S/P BKA (below knee amputation), left (HCC) 12/08/2023   Hyperosmolar hyperglycemic state (HHS) (HCC) 12/07/2023   Stroke (HCC)    Symptomatic bradycardia 05/06/2018   Postural dizziness with near syncope 05/06/2018   Labile blood glucose    Acute blood loss anemia    Labile blood pressure    CKD (chronic kidney disease) stage 2, GFR 60-89 ml/min    Type 2 diabetes mellitus with peripheral neuropathy (HCC)    Post-operative pain    Amputation of left lower extremity below knee upon examination (HCC)  11/24/2017   Amputated below knee, left (HCC) 11/22/2017   Osteomyelitis of left foot (HCC)    Dehiscence of amputation stump (HCC) 08/11/2017   S/P transmetatarsal amputation of foot, left (HCC) 07/19/2017   Subacute osteomyelitis, left ankle and foot (HCC) 07/17/2017   Diabetic ulcer of left foot associated with type 2 diabetes mellitus (HCC)    Diabetes mellitus with renal complications (HCC) 10/17/2014   Diabetes (HCC) 10/13/2014   Normocytic anemia 10/13/2014   Hyperkalemia 10/13/2014    PCP: Ransom Other, MD  REFERRING PROVIDER: Harden Jerona GAILS, MD  ONSET DATE: prosthesis delivery: 03/15/2024  REFERRING DIAG: S10.488 (ICD-10-CM) - Right below-knee amputee (HCC)   THERAPY DIAG:  Other abnormalities of gait and mobility  Unsteadiness on feet  Stiffness of right knee, not elsewhere classified  Muscle weakness (generalized)  Rationale for Evaluation and Treatment: Rehabilitation  SUBJECTIVE:   SUBJECTIVE STATEMENT:  He worked in yard on uneven terrain.  He was putting pavers around a tree.  He also played game of pool.  He was very active.  Pt accompanied by: significant other  PERTINENT HISTORY:  Right TTA 12/20/23, L TTA 2019, DM2, CKD3, PVD, arthritis, peripheral neuropathy, CVA,   Patient is a 78 y.o. male who arrives to PT session with new Rt BKA (12/20/2023) and prostheses (first RLE & new LLE) delivery date 03/15/2024. Patient has a history of previous Lt BKA in 2019. Patient reports being able to stand at sink with UE support. Patient has new prosthesis with vacuum pin suction on both LEs which is new system that he is not familiar with. Patient has progressed wear of Rt prosthesis from 1 hour 2x/day up to 3 hrs, 2x/ day with minimal discomfort.   PAIN:  Are you having pain? Not rated, though some soreness noted in Rt residual limb.  PRECAUTIONS: None  WEIGHT BEARING RESTRICTIONS: No  FALLS: Has patient fallen in last 6 months? No  LIVING ENVIRONMENT: Lives  with: lives with their spouse Lives in: House Home Access: Ramped entrance or by garage which is one step Home layout: Two level, 1/2 bath on main level (not accessible with w/c), and Bed/bath upstairs Stairs: Yes: Internal: 16 steps; on left going up Has following equipment at home: Single point cane, Environmental Consultant - 2 wheeled, Wheelchair (manual), shower chair, and Tour manager  OCCUPATION: retired, works as art therapist on occasion  PLOF: modified independence  PATIENT GOALS: function with both prostheses at community level, doing yard corporate treasurer   MD appt: follow up 04/30/2024   OBJECTIVE:  Patient-Specific Activity Scoring Scheme  0 represents unable to perform. 10 represents able to perform at prior level. 0 1 2 3 4 5 6 7 8 9  10 (Date and Score)  Activity Eval  03/20/24  04/25/2024 06/06/2024  1.  walking 0   4 8  2. standing ADLs  0  8 9  3. drive 0 0 0  4.stairs  0 7 9  5. Yardwork/handyman work 0 6 9  Score 0 5 7   Total score = sum of the activity scores/number of activities Minimum detectable change (90%CI) for average score = 2 points Minimum detectable change (90%CI) for single activity score = 3 points  COGNITION: Overall cognitive status: Within functional limits for tasks assessed   SENSATION: WFL  POSTURE: rounded shoulders, forward head, flexed trunk , and weight shift left  LOWER EXTREMITY ROM:  ROM P:passive  A:active Right eval Left eval  Hip flexion    Hip extension    Hip abduction    Hip adduction    Hip internal rotation    Hip external rotation    Knee flexion    Knee extension Seated  P: -5* Seated  P: 0*  Ankle dorsiflexion    Ankle plantarflexion    Ankle inversion    Ankle eversion     (Blank rows = not tested)  LOWER EXTREMITY MMT:  MMT Right eval Left eval  Hip flexion    Hip extension 3-/5 4-/5  Hip abduction 3/5 4-/5  Hip adduction    Hip internal rotation    Hip external rotation    Knee flexion  3/5   Knee extension 3/5   At Evaluation all strength testing is grossly seated and functionally standing / gait. (Blank rows = not tested)  TRANSFERS: Sit to stand: Mod A, dependent on heavy BUE support 18 chair with armrests, use RW to stabilize to stand upright Stand to sit: Min A with BUE support on armrests to 18 chair, requires RW for stability Lateral scoot transfers: SBA with BUE support and 2 chairs touching same height  FUNCTIONAL TESTs:  Pt requires minA with heavy BUE support on RW for static standing balance.   GAIT: Gait pattern: step to pattern, decreased step length- Right, decreased step length- Left, decreased stance time- Right, decreased hip/knee flexion- Right, Right hip hike, knee flexed in stance- Right, lateral hip instability, trunk flexed, and wide BOS Distance walked: 22' Assistive device utilized: Environmental Consultant - 2 wheeled Level of assistance: Mod A  Gait velocity: 06/06/2024 SS with quad cane: 1.76 ft/s FAC with quad cane: 2.42 ft/s SS without AD: 2.00 ft/s FAC without AD: 2.74 ft/s  07/01/2024: 6 Minute Walk Test without device except bilateral TTA prostheses modified independent 733'  at end HR 99 & SpO2 99%  CURRENT PROSTHETIC WEAR ASSESSMENT: 07/01/2024: Patient reports wearing prostheses greater than 90% of awake hours /most of the day without any skin or limb pain issues. Patient is independent with: skin check, residual limb care, prosthetic cleaning, ply sock cleaning, correct ply sock adjustment, proper wear schedule/adjustment, proper weight-bearing schedule/adjustment and donning/doffing.  03/20/2024  Patient is dependent with: skin check, residual limb care, prosthetic cleaning, ply sock cleaning, correct ply sock adjustment, proper wear schedule/adjustment, and proper weight-bearing schedule/adjustment Donning prosthesis: Mod A Doffing prosthesis: Mod A Prosthetic wear tolerance: progressed up to 3 hours, 2x/day for last day,  he has worn right  prosthesis daily since delivery 6 days ago.  He is wearing Left prosthesis all awake hours.  Prosthetic weight bearing tolerance: pt tolerated standing & gait with RW support for 5 min total with no c/o limb pain.  Edema: RLE pitting with 5-second capillary refill.  None noted in LLE. Residual limb condition:   Right: bone length  7, limb length 8, scar healed, no hair growth, normal skin color and temperature, 1 medial wound 2mm which is clean with no signs of infection, cylindrical shape  Left: conical shape, shrunk with bony landmarks prominent, dark & callused pressure areas at fibula head, tibial tuberosity and lateral tibial plateau.  Prosthetic description: total contact socket with backing pin suction, dynamic response feet   [facility] PT Assessment - 04/25/24 0001       Berg Balance Test   Sit to Stand Able to stand  independently using hands    Standing Unsupported Able to stand 30 seconds unsupported    Sitting with Back Unsupported but Feet Supported on Floor or Stool Able to sit safely and securely 2 minutes    Stand to Sit Controls descent by using hands    Transfers Able to transfer safely, definite need of hands    Standing Unsupported with Eyes Closed Able to stand 10 seconds with supervision    Standing Unsupported with Feet Together Able to place feet together independently but unable to hold for 30 seconds    From Standing, Reach Forward with Outstretched Arm Can reach forward >12 cm safely (5)    From Standing Position, Pick up Object from Floor Able to pick up shoe, needs supervision    From Standing Position, Turn to Look Behind Over each Shoulder Needs supervision when turning    Turn 360 Degrees Needs close supervision or verbal cueing    Standing Unsupported, Alternately Place Feet on Step/Stool Able to complete >2 steps/needs minimal assist    Standing Unsupported, One Foot in Front Able to take small step independently and hold 30 seconds    Standing on One Leg Tries  to lift leg/unable to hold 3 seconds but remains standing independently    Total Score 32    Berg comment: intermittent CGA throughout with RW use in between activities            [facility] PT Assessment - 06/06/24 0001       Berg Balance Test   Sit to Stand Able to stand  independently using hands    Standing Unsupported Able to stand 2 minutes with supervision    Sitting with Back Unsupported but Feet Supported on Floor or Stool Able to sit safely and securely 2 minutes    Stand to Sit Controls descent by using hands    Transfers Able to transfer safely, definite need of hands    Standing Unsupported with Eyes Closed Able to stand 10 seconds with supervision    Standing Unsupported with Feet Together Able to place feet together independently but unable to hold for 30 seconds    From Standing, Reach Forward with Outstretched Arm Can reach forward >12 cm safely (5)    From Standing Position, Pick up Object from Floor Able to pick up shoe, needs supervision    From Standing Position, Turn to Look Behind Over each Shoulder Needs supervision when turning    Turn 360 Degrees Able to turn 360 degrees safely but slowly    Standing Unsupported, Alternately Place Feet on Step/Stool Able to complete >2 steps/needs minimal assist    Standing Unsupported, One Foot in [employer] balance while stepping or standing    Standing on One Leg Unable to try or needs assist to prevent fall    Total Score 31    Berg comment: intermittent CGA throughout with use of unilat quad cane between         Patient  able to intermittently perform some BERG activities with quad cane without requirement of supervision, though heavily relying on quad cane for stability (especially with increased single leg activities)    TODAY'S TREATMENT:                                                                                                                             DATE:  07/01/2024 Therapeutic activities: PT demo and  verbal cues on possibly using garden kneel bench for performing activities like a brick work he did.  Patient verbalized understanding. See objective data for 6-minute walk test  Neuro Re-Ed:  Stepping up and over BOSU with round side up using bilat UE support on parallel bars 5 reps with each LE on BOSU.  Lateral step up to right into left with crossing contralateral LE across midline anteriorly maintaining stance knee extension and posteriorly with slight stance knee flexion.  5 reps to right and to left with PT cues. Standing on BOSU flat side up with bilat UE support to challenge weight shifting and balance Ant/post with feet in bilateral stance 10 reps Right/left with feet in bilateral stance 10 reps Circles CW & CCW with feet in bilateral stance 10 reps ea.   Partial squat with feet in bilateral stance 10 reps Ant/post with feet in step position 10 reps with ea LE forward.     TREATMENT:                                                                                                                             DATE:  06/27/2024 Therapeutic Exercise: PT verbally educated pt on well rounded fitness program to include exercises that address muscle endurance, flexibility, strength & balance. Goal would be to address each area at least 3x/wk and some exercises may address more than one area.  Pt & wife verbalized understanding.   PT demo & verbal cues on modifications for riding stationary bike with TTA prostheses. Pt able to ride Precor bike for 4 min level 1.  Neuro Re-Ed:  Lateral walks on foam pad with bilat UE use to challenge balance on compliant surface including transitioning on/off compliant surface. Stepping up and over BOSU with round side up using bilat UE support on parallel bars 10 reps with each LE on BOSU. Cues to step off close to line on floor to facilitate narrow base.   PT placing 6 step toward swing side  when stepping over BOSU to decrease circumduction  Standing on BOSU  flat side up with bilat UE support to challenge weight shifting and balance Ant/post with feet in bilateral stance 10 reps Right/left with feet in bilateral stance 10 reps Circles CW & CCW with feet in bilateral stance 10 reps ea.   Partial squat with feet in bilateral stance 10 reps Ant/post with feet in step position 10 reps with ea LE forward.   Set up for safety at sink with chair back to his right & left with Airex (compliant surface) in between Stepping on & off Airex alternating lead LE with intermittent UE touch. SLS on Airex with contralateral LE tapping knee to counter & back to floor - requires UE support but focus on minimizing weight bearing.   Side stepping onto Airex from right 10 reps and from left 10 reps with touch on counter.      TREATMENT:                                                                                                                             DATE:  06/26/2024 Prosthetic Care with bilateral BKA prostheses: PT demo & verbal cues on driving options with bilateral TTA prostheses.  PT recommended starting in middle of large empty parking lot.  Pt and wife verbalized understanding.   Neuro Re-Ed:  Lateral walks on foam pad with bilat UE use to challenge balance on compliant surface including transitioning on/off compliant surface. Stepping up and over BOSU with round side up using bilat UE support on parallel bars 10 reps with each LE on BOSU. Cues to step off close to line on floor to facilitate narrow base.   PT placing 6 step toward swing side when stepping over BOSU to decrease circumduction  Standing on BOSU flat side up with bilat UE support to challenge weight shifting and balance Ant/post with feet in bilateral stance 10 reps Right/left with feet in bilateral stance 10 reps Circles CW & CCW with feet in bilateral stance 10 reps ea.   Ant/post with feet in step position 10 reps with ea LE forward.   Set up for safety at sink with chair back to  his right & left with Airex (compliant surface) in between Stepping on & off Airex alternating lead LE with intermittent UE touch. SLS on Airex with contralateral LE tapping knee to counter & back to floor - requires UE support but focus on minimizing weight bearing.   Side stepping onto Airex from right 10 reps and from left 10 reps with touch on counter.     TREATMENT:  DATE:  06/20/2024 Neuro Re-Ed:  Lateral walks on foam pad with bilat UE use to challenge balance on compliant surface 1x6 down and back  Intermittent attempts to complete without UE support, though unable secondary to unsteadiness (no increased physical assist required from PT) Rocker board with bilat UE support to challenge weight shifting and balance 1x10 fwd/back with intermittent verbal cues provided for control of knee extension with LE placed back in stepping position  1x10 lateral rocker board with focus on hip and knee control  Stepping up and over BOSU with dome side up using bilat UE support on parallel bars 1x6 with each LE on BOSU PT placing 6 step toward swing side when stepping over BOSU to decrease circumduction   Therapeutic Exercise:  Unilat leg press 1x10 with 50# each LE then performing 2x6 with 62#  Intermittent verbal cues provided for full knee extension (Lt>Rt)    HOME EXERCISE PROGRAM: Access Code: [email or web address] URL: [email or web address] Date: 06/10/2024 Prepared by: Grayce Spatz  Exercises - Upright Stance at Door Frame Single Arm  - 1-3 x daily - 7 x weekly - 1 sets - 2 reps - 2 deep breathes hold - Upright Stance at Door Frame with Both Arms  - 1-3 x daily - 7 x weekly - 1 sets - 2 reps - 2 deep breathes hold - standing calf stretch with forefoot on small step or brick  - 1 x daily - 5-7 x weekly - 1 sets - 3 reps - 30 seconds hold - Seated Table Hamstring  Stretch  - 1 x daily - 5-7 x weekly - 1 sets - 3 reps - 30 seconds hold - Thomas Stretch on Table  - 1 x daily - 5-7 x weekly - 1 sets - 3 reps - 30 seconds hold - Standing ball rolls front to back  - 1 x daily - 5 x weekly - 1 sets - 10 reps - 5 seconds hold - standing ball rolls inward / outward  - 1 x daily - 5 x weekly - 1 sets - 10 reps - 5 seconds hold - standing ball rolls in circles  - 1 x daily - 5 x weekly - 1 sets - 10 reps - 5 seconds hold - Hip Extension with Resistance Loop  - 1 x daily - 5 x weekly - 1 sets - 10 reps - 5 seconds hold - Standing Hip Abduction with Resistance at Ankles and Counter Support  - 1 x daily - 5 x weekly - 1 sets - 10 reps - 5 seconds hold - Standing Repeated Hip Flexion with Resistance  - 1 x daily - 5 x weekly - 1 sets - 10 reps - 5 seconds hold   ASSESSMENT:  CLINICAL IMPRESSION:  Patient met LTG for gait.  Patient appears to understand ongoing HEP exercises using BOSU.    patient will continue to benefit from skilled PT.   OBJECTIVE IMPAIRMENTS: Abnormal gait, decreased activity tolerance, decreased balance, decreased coordination, decreased endurance, decreased knowledge of use of DME, decreased mobility, difficulty walking, decreased ROM, decreased strength, impaired flexibility, improper body mechanics, postural dysfunction, and prosthetic dependency .   ACTIVITY LIMITATIONS: carrying, lifting, bending, sitting, standing, squatting, stairs, transfers, bathing, toileting, dressing, reach over head, hygiene/grooming, and locomotion level  PARTICIPATION LIMITATIONS: meal prep, cleaning, driving, shopping, community activity, occupation, and yard work  PERSONAL FACTORS: Age, Fitness, Past/current experiences, Time since onset of injury/illness/exacerbation, and 3+ comorbidities: see PMH are also affecting patient's functional outcome.  REHAB POTENTIAL: Good  CLINICAL DECISION MAKING: Evolving/moderate complexity  EVALUATION COMPLEXITY:  Moderate   GOALS: Goals reviewed with patient? Yes  SHORT TERM GOALS: Target date: 04/19/2024  Patient donnes prosthesis modified independent & verbalizes proper cleaning. Baseline: SEE OBJECTIVE DATA Goal status: MET   04/16/2024 2.  Patient tolerates right prosthesis >10 hrs total /day without skin issues or limb pain. Baseline: SEE OBJECTIVE DATA Goal status:  MET 04/16/2024  3.  Patient able to reach 7 and look over both shoulders with RW support with supervision. Baseline: SEE OBJECTIVE DATA Goal status: Met 04/16/2024  4. Patient ambulates 87' with RW & prosthesis with CGA. Baseline: SEE OBJECTIVE DATA Goal status: MET   04/16/2024  5. Patient transfers sit to/from stand 18 chair with armrest to RW with CGA.  Baseline: SEE OBJECTIVE DATA Goal status: MET   04/16/2024  LONG TERM GOALS: Target date: 07/04/2024  Patient demonstrates & verbalized understanding of prosthetic care to enable safe utilization of prostheses. Baseline: SEE OBJECTIVE DATA Goal status: Met 07/01/2024  Patient tolerates prostheses wear >90% of awake hours without skin or limb pain issues. Baseline: SEE OBJECTIVE DATA Goal status: Met    07/01/2024  Patient ambulates >500' with LRAD and prostheses independently Baseline: SEE OBJECTIVE DATA Goal status:   Met 07/01/2024  Patient negotiates ramps, curbs & stairs with single rail with prostheses with LRAD independently. Baseline: SEE OBJECTIVE DATA Goal status: ongoing       07/01/2024  Patient demonstrates & verbalizes lifting, carrying, pushing, pulling with prostheses only safely. Baseline: SEE OBJECTIVE DATA Goal status: ongoing    07/01/2024  6.  Patient reports Patient-Specific Activity Score improved the average >/= 6 to indicate improvement in functional activities.   Baseline: SEE OBJECTIVE DATA Goal status: GOAL MET, 06/06/2024  PLAN:  PT FREQUENCY: 2x/week  PT DURATION: 10 weeks  PLANNED INTERVENTIONS: 97164- PT Re-evaluation,  97750- Physical Performance Testing, 97110-Therapeutic exercises, 97530- Therapeutic activity, [id number]- Neuromuscular re-education, 97535- Self Care, [id number]- Manual therapy, [id number]- Gait training, [phone]- Prosthetic Initial , [phone]- Orthotic/Prosthetic subsequent, Patient/Family education, Balance training, Stair training, Scar mobilization, and DME instructions  PLAN FOR NEXT SESSION: Check remaining LTG's.  Address any outlier concerns.  Discharge.    Grayce Spatz, PT, DPT 07/01/2024, 4:24 PM     Referring diagnosis? Z89.511 (ICD-10-CM) - Right below-knee amputee (has history left BKA so now Bil. BKAs) Treatment diagnosis? (if different than referring diagnosis) R26.89, M62.81, M25.661, R26.81 What was this (referring dx) caused by? [x]  Surgery []  Fall []  Ongoing issue []  Arthritis []  Other: ____________  Laterality: []  Rt []  Lt [x]  Both  Check all possible CPT codes:  *CHOOSE 10 OR LESS*    See Planned Interventions listed in the Plan section of the Evaluation.

## 2024-07-03 ENCOUNTER — Encounter: Payer: Self-pay | Admitting: Physical Therapy

## 2024-07-03 ENCOUNTER — Ambulatory Visit: Admitting: Physical Therapy

## 2024-07-03 DIAGNOSIS — M25661 Stiffness of right knee, not elsewhere classified: Secondary | ICD-10-CM

## 2024-07-03 DIAGNOSIS — R2681 Unsteadiness on feet: Secondary | ICD-10-CM

## 2024-07-03 DIAGNOSIS — R2689 Other abnormalities of gait and mobility: Secondary | ICD-10-CM | POA: Diagnosis not present

## 2024-07-03 DIAGNOSIS — M6281 Muscle weakness (generalized): Secondary | ICD-10-CM | POA: Diagnosis not present

## 2024-07-03 NOTE — Therapy (Signed)
 OUTPATIENT PHYSICAL THERAPY PROSTHETIC TREATMENT & DISCHARGE SUMMARY   Patient Name: Kevin Bradford MRN: [id number] DOB:1945/10/08, 78 y.o., male Today's Date: 07/03/2024  PHYSICAL THERAPY DISCHARGE SUMMARY  Visits from Start of Care: 28  Current functional level related to goals / functional outcomes: See below   Remaining deficits: See below   Education / Equipment: Patient was educated in prosthetic care and ongoing HEP which he appears to understand.     Patient agrees to discharge. Patient goals were met. Patient is being discharged due to meeting the stated rehab goals.  END OF SESSION:  PT End of Session - 07/03/24 1348     Visit Number 28    Number of Visits 30    Date for Recertification  07/04/24    Authorization Type [employer]    Authorization Time Period NO COPAY  Approved  Authorization #[id number]  Tracking #[id number]  06/06/2024-07/04/2024  10 PT Visits    Authorization - Visit Number 8    Authorization - Number of Visits 10    Progress Note Due on Visit 30    PT Start Time 1348    PT Stop Time 1411    PT Time Calculation (min) 23 min    Equipment Utilized During Treatment Gait belt    Activity Tolerance Patient tolerated treatment well    Behavior During Therapy WFL for tasks assessed/performed             Past Medical History:  Diagnosis Date   Arthritis    joint pain (08/16/2017)   Below knee amputation (HCC)    Cellulitis and abscess of leg 10/18/2014   CKD (chronic kidney disease) stage 3, GFR 30-59 ml/min (HCC)    creatinine increases with antibiotics per pt, no known kidney disease per patient   Dehiscence of closure of skin, subsequent encounter    Diabetic foot ulcer (HCC)    left   Diabetic peripheral neuropathy (HCC)    GERD (gastroesophageal reflux disease)    12/26/2017- not this year.    Osteomyelitis (HCC)    left transmetatarsal    Stroke (HCC)    Type II diabetes mellitus [facility])    Past Surgical History:   Procedure Laterality Date   AMPUTATION Left 07/19/2017   Procedure: LEFT TRANSMETATARSAL AMPUTATION;  Surgeon: Harden Jerona GAILS, MD;  Location: [facility] OR;  Service: Orthopedics;  Laterality: Left;   AMPUTATION Left 08/16/2017   REVISION LEFT TRANSMETATARSAL AMPUTATION   AMPUTATION Left 11/22/2017   Procedure: LEFT BELOW KNEE AMPUTATION;  Surgeon: Harden Jerona GAILS, MD;  Location: [facility] OR;  Service: Orthopedics;  Laterality: Left;   AMPUTATION Right 12/20/2023   Procedure: AMPUTATION RIGHT BELOW KNEE;  Surgeon: Harden Jerona GAILS, MD;  Location: [facility] OR;  Service: Orthopedics;  Laterality: Right;   CATARACT EXTRACTION W/ INTRAOCULAR LENS  IMPLANT, BILATERAL  ~ 2016   EYE SURGERY Bilateral    catarct   I & D EXTREMITY Left 10/17/2014   Procedure: IRRIGATION AND DEBRIDEMENT EXTREMITY LEFT FOOT;  Surgeon: Jerona Harden GAILS, MD;  Location: [facility] OR;  Service: Orthopedics;  Laterality: Left;   IR ANGIO INTRA EXTRACRAN SEL COM CAROTID INNOMINATE BILAT MOD SED  05/07/2018   IR ANGIO VERTEBRAL SEL SUBCLAVIAN INNOMINATE BILAT MOD SED  05/07/2018   STUMP REVISION Left 08/16/2017   Procedure: REVISION LEFT TRANSMETATARSAL AMPUTATION;  Surgeon: Harden Jerona GAILS, MD;  Location: [facility] OR;  Service: Orthopedics;  Laterality: Left;   STUMP REVISION Left 12/27/2017   Procedure: LEFT BELOW KNEE AMPUTATION REVISION;  Surgeon: Harden Jerona GAILS, MD;  Location: [facility] OR;  Service: Orthopedics;  Laterality: Left;   TOE AMPUTATION Right 2013   5th toe   Patient Active Problem List   Diagnosis Date Noted   Coping style affecting medical condition 12/29/2023   Malnutrition of moderate degree 12/25/2023   Right below-knee amputee (HCC) 12/25/2023   Diabetic infection of right foot (HCC) 12/16/2023   History of stroke 12/16/2023   Acute osteomyelitis of right calcaneus (HCC) 12/16/2023   Cutaneous abscess of right foot 12/16/2023   DKA, type 2 (HCC) 12/08/2023   Hypokalemia 12/08/2023   S/P BKA (below knee amputation), left (HCC) 12/08/2023    Hyperosmolar hyperglycemic state (HHS) (HCC) 12/07/2023   Stroke (HCC)    Symptomatic bradycardia 05/06/2018   Postural dizziness with near syncope 05/06/2018   Labile blood glucose    Acute blood loss anemia    Labile blood pressure    CKD (chronic kidney disease) stage 2, GFR 60-89 ml/min    Type 2 diabetes mellitus with peripheral neuropathy (HCC)    Post-operative pain    Amputation of left lower extremity below knee upon examination (HCC) 11/24/2017   Amputated below knee, left (HCC) 11/22/2017   Osteomyelitis of left foot (HCC)    Dehiscence of amputation stump (HCC) 08/11/2017   S/P transmetatarsal amputation of foot, left (HCC) 07/19/2017   Subacute osteomyelitis, left ankle and foot (HCC) 07/17/2017   Diabetic ulcer of left foot associated with type 2 diabetes mellitus (HCC)    Diabetes mellitus with renal complications (HCC) 10/17/2014   Diabetes (HCC) 10/13/2014   Normocytic anemia 10/13/2014   Hyperkalemia 10/13/2014    PCP: Ransom Other, MD  REFERRING PROVIDER: Harden Jerona GAILS, MD  ONSET DATE: prosthesis delivery: 03/15/2024  REFERRING DIAG: S10.488 (ICD-10-CM) - Right below-knee amputee (HCC)   THERAPY DIAG:  Other abnormalities of gait and mobility  Unsteadiness on feet  Stiffness of right knee, not elsewhere classified  Muscle weakness (generalized)  Rationale for Evaluation and Treatment: Rehabilitation  SUBJECTIVE:   SUBJECTIVE STATEMENT:  He is wearing both prostheses all day without issues.   Pt accompanied by: significant other  PERTINENT HISTORY:  Right TTA 12/20/23, L TTA 2019, DM2, CKD3, PVD, arthritis, peripheral neuropathy, CVA,   Patient is a 78 y.o. male who arrives to PT session with new Rt BKA (12/20/2023) and prostheses (first RLE & new LLE) delivery date 03/15/2024. Patient has a history of previous Lt BKA in 2019. Patient reports being able to stand at sink with UE support. Patient has new prosthesis with vacuum pin suction on both LEs  which is new system that he is not familiar with. Patient has progressed wear of Rt prosthesis from 1 hour 2x/day up to 3 hrs, 2x/ day with minimal discomfort.   PAIN:  Are you having pain? Not rated, though some soreness noted in Rt residual limb.  PRECAUTIONS: None  WEIGHT BEARING RESTRICTIONS: No  FALLS: Has patient fallen in last 6 months? No  LIVING ENVIRONMENT: Lives with: lives with their spouse Lives in: House Home Access: Ramped entrance or by garage which is one step Home layout: Two level, 1/2 bath on main level (not accessible with w/c), and Bed/bath upstairs Stairs: Yes: Internal: 16 steps; on left going up Has following equipment at home: Single point cane, Walker - 2 wheeled, Wheelchair (manual), shower chair, and Tour manager  OCCUPATION: retired, works as art therapist on occasion  PLOF: modified independence  PATIENT GOALS: function with both prostheses at kb home	[place]  level, doing yard corporate treasurer   MD appt: follow up 04/30/2024   OBJECTIVE:  Patient-Specific Activity Scoring Scheme  0 represents unable to perform. 10 represents able to perform at prior level. 0 1 2 3 4 5 6 7 8 9  10 (Date and Score)  Activity Eval  03/20/24  04/25/24 06/06/24 07/03/24  1.  walking 0   4 8 8.5  2. standing ADLs  0  8 9 9   3. drive 0 0 0 1.5  4.stairs  0 7 9 9   5. Yardwork/handyman work 0 6 9 9   Score 0 5 7 7.4   Total score = sum of the activity scores/number of activities Minimum detectable change (90%CI) for average score = 2 points Minimum detectable change (90%CI) for single activity score = 3 points  COGNITION: Overall cognitive status: Within functional limits for tasks assessed   SENSATION: WFL  POSTURE: rounded shoulders, forward head, flexed trunk , and weight shift left  LOWER EXTREMITY ROM:  ROM P:passive  A:active Right eval Left eval  Hip flexion    Hip extension    Hip abduction    Hip adduction    Hip internal rotation     Hip external rotation    Knee flexion    Knee extension Seated  P: -5* Seated  P: 0*  Ankle dorsiflexion    Ankle plantarflexion    Ankle inversion    Ankle eversion     (Blank rows = not tested)  LOWER EXTREMITY MMT:  MMT Right eval Left eval  Hip flexion    Hip extension 3-/5 4-/5  Hip abduction 3/5 4-/5  Hip adduction    Hip internal rotation    Hip external rotation    Knee flexion 3/5   Knee extension 3/5   At Evaluation all strength testing is grossly seated and functionally standing / gait. (Blank rows = not tested)  TRANSFERS: Evaluation on 03/20/2024:  Sit to stand: Mod A, dependent on heavy BUE support 18 chair with armrests, use RW to stabilize to stand upright Stand to sit: Min A with BUE support on armrests to 18 chair, requires RW for stability Lateral scoot transfers: SBA with BUE support and 2 chairs touching same height   GAIT: Evaluation on 03/20/2024: Gait pattern: step to pattern, decreased step length- Right, decreased step length- Left, decreased stance time- Right, decreased hip/knee flexion- Right, Right hip hike, knee flexed in stance- Right, lateral hip instability, trunk flexed, and wide BOS Distance walked: 22' Assistive device utilized: Environmental Consultant - 2 wheeled Level of assistance: Mod A  Gait velocity: 06/06/2024 SS with quad cane: 1.76 ft/s FAC with quad cane: 2.42 ft/s SS without AD: 2.00 ft/s FAC without AD: 2.74 ft/s  07/01/2024: 6 Minute Walk Test without device except bilateral TTA prostheses modified independent 733'  at end HR 99 & SpO2 99%  CURRENT PROSTHETIC WEAR ASSESSMENT: 07/01/2024: Patient reports wearing prostheses greater than 90% of awake hours /most of the day without any skin or limb pain issues. Patient is independent with: skin check, residual limb care, prosthetic cleaning, ply sock cleaning, correct ply sock adjustment, proper wear schedule/adjustment, proper weight-bearing schedule/adjustment and  donning/doffing.  03/20/2024  Patient is dependent with: skin check, residual limb care, prosthetic cleaning, ply sock cleaning, correct ply sock adjustment, proper wear schedule/adjustment, and proper weight-bearing schedule/adjustment Donning prosthesis: Mod A Doffing prosthesis: Mod A Prosthetic wear tolerance: progressed up to 3 hours, 2x/day for last day,  he has worn right prosthesis daily since  delivery 6 days ago.  He is wearing Left prosthesis all awake hours.  Prosthetic weight bearing tolerance: pt tolerated standing & gait with RW support for 5 min total with no c/o limb pain.  Edema: RLE pitting with 5-second capillary refill.  None noted in LLE. Residual limb condition:   Right: bone length 7, limb length 8, scar healed, no hair growth, normal skin color and temperature, 1 medial wound 2mm which is clean with no signs of infection, cylindrical shape  Left: conical shape, shrunk with bony landmarks prominent, dark & callused pressure areas at fibula head, tibial tuberosity and lateral tibial plateau.  Prosthetic description: total contact socket with backing pin suction, dynamic response feet  FUNCTIONAL TESTs:  Evaluation on 03/20/2024:  Pt requires minA with heavy BUE support on RW for static standing balance.     [facility] PT Assessment - 04/25/24 0001       Berg Balance Test   Sit to Stand Able to stand  independently using hands    Standing Unsupported Able to stand 30 seconds unsupported    Sitting with Back Unsupported but Feet Supported on Floor or Stool Able to sit safely and securely 2 minutes    Stand to Sit Controls descent by using hands    Transfers Able to transfer safely, definite need of hands    Standing Unsupported with Eyes Closed Able to stand 10 seconds with supervision    Standing Unsupported with Feet Together Able to place feet together independently but unable to hold for 30 seconds    From Standing, Reach Forward with Outstretched Arm Can reach forward  >12 cm safely (5)    From Standing Position, Pick up Object from Floor Able to pick up shoe, needs supervision    From Standing Position, Turn to Look Behind Over each Shoulder Needs supervision when turning    Turn 360 Degrees Needs close supervision or verbal cueing    Standing Unsupported, Alternately Place Feet on Step/Stool Able to complete >2 steps/needs minimal assist    Standing Unsupported, One Foot in Front Able to take small step independently and hold 30 seconds    Standing on One Leg Tries to lift leg/unable to hold 3 seconds but remains standing independently    Total Score 32    Berg comment: intermittent CGA throughout with RW use in between activities            [facility] PT Assessment - 06/06/24 0001       Berg Balance Test   Sit to Stand Able to stand  independently using hands    Standing Unsupported Able to stand 2 minutes with supervision    Sitting with Back Unsupported but Feet Supported on Floor or Stool Able to sit safely and securely 2 minutes    Stand to Sit Controls descent by using hands    Transfers Able to transfer safely, definite need of hands    Standing Unsupported with Eyes Closed Able to stand 10 seconds with supervision    Standing Unsupported with Feet Together Able to place feet together independently but unable to hold for 30 seconds    From Standing, Reach Forward with Outstretched Arm Can reach forward >12 cm safely (5)    From Standing Position, Pick up Object from Floor Able to pick up shoe, needs supervision    From Standing Position, Turn to Look Behind Over each Shoulder Needs supervision when turning    Turn 360 Degrees Able to turn 360 degrees safely but  slowly    Standing Unsupported, Alternately Place Feet on Step/Stool Able to complete >2 steps/needs minimal assist    Standing Unsupported, One Foot in [employer] balance while stepping or standing    Standing on One Leg Unable to try or needs assist to prevent fall    Total Score 31     Berg comment: intermittent CGA throughout with use of unilat quad cane between         Patient able to intermittently perform some BERG activities with quad cane without requirement of supervision, though heavily relying on quad cane for stability (especially with increased single leg activities)    TODAY'S TREATMENT:                                                                                                                             DATE:  07/03/2024 Therapeutic activities: Pt able to push & pull weight cart safely.  He was able to lift & carry 15# kettle bell safely.  He reports that he has returned to yard work including building wall around tree with paver stones that weigh 15#. Pt neg ramp with rail forward facing and with SBQC side stepping as incline in PT clinic is 12*.  Standard w/c ramp is 7*.  He reports no issues in community with standard ramps with his SBQC.   Pt neg curb with SBQC modifieid independent.   Pt neg stairs with 1 rail & SBQC alternating pattern safely or 1 rail side stepping.  He demo safe technique going down stairs backwards if taking something down the stairs.      TREATMENT:                                                                                                                             DATE:  07/01/2024 Therapeutic activities: PT demo and verbal cues on possibly using garden kneel bench for performing activities like a brick work he did.  Patient verbalized understanding. See objective data for 6-minute walk test  Neuro Re-Ed:  Stepping up and over BOSU with round side up using bilat UE support on parallel bars 5 reps with each LE on BOSU.  Lateral step up to right into left with crossing contralateral LE across midline anteriorly maintaining stance knee extension and posteriorly with slight stance knee flexion.  5 reps to right and to left with PT cues. Standing on BOSU flat side up  with bilat UE support to challenge weight shifting and  balance Ant/post with feet in bilateral stance 10 reps Right/left with feet in bilateral stance 10 reps Circles CW & CCW with feet in bilateral stance 10 reps ea.   Partial squat with feet in bilateral stance 10 reps Ant/post with feet in step position 10 reps with ea LE forward.     TREATMENT:                                                                                                                             DATE:  06/27/2024 Therapeutic Exercise: PT verbally educated pt on well rounded fitness program to include exercises that address muscle endurance, flexibility, strength & balance. Goal would be to address each area at least 3x/wk and some exercises may address more than one area.  Pt & wife verbalized understanding.   PT demo & verbal cues on modifications for riding stationary bike with TTA prostheses. Pt able to ride Precor bike for 4 min level 1.  Neuro Re-Ed:  Lateral walks on foam pad with bilat UE use to challenge balance on compliant surface including transitioning on/off compliant surface. Stepping up and over BOSU with round side up using bilat UE support on parallel bars 10 reps with each LE on BOSU. Cues to step off close to line on floor to facilitate narrow base.   PT placing 6 step toward swing side when stepping over BOSU to decrease circumduction  Standing on BOSU flat side up with bilat UE support to challenge weight shifting and balance Ant/post with feet in bilateral stance 10 reps Right/left with feet in bilateral stance 10 reps Circles CW & CCW with feet in bilateral stance 10 reps ea.   Partial squat with feet in bilateral stance 10 reps Ant/post with feet in step position 10 reps with ea LE forward.   Set up for safety at sink with chair back to his right & left with Airex (compliant surface) in between Stepping on & off Airex alternating lead LE with intermittent UE touch. SLS on Airex with contralateral LE tapping knee to counter & back to floor -  requires UE support but focus on minimizing weight bearing.   Side stepping onto Airex from right 10 reps and from left 10 reps with touch on counter.      TREATMENT:  DATE:  06/26/2024 Prosthetic Care with bilateral BKA prostheses: PT demo & verbal cues on driving options with bilateral TTA prostheses.  PT recommended starting in middle of large empty parking lot.  Pt and wife verbalized understanding.   Neuro Re-Ed:  Lateral walks on foam pad with bilat UE use to challenge balance on compliant surface including transitioning on/off compliant surface. Stepping up and over BOSU with round side up using bilat UE support on parallel bars 10 reps with each LE on BOSU. Cues to step off close to line on floor to facilitate narrow base.   PT placing 6 step toward swing side when stepping over BOSU to decrease circumduction  Standing on BOSU flat side up with bilat UE support to challenge weight shifting and balance Ant/post with feet in bilateral stance 10 reps Right/left with feet in bilateral stance 10 reps Circles CW & CCW with feet in bilateral stance 10 reps ea.   Ant/post with feet in step position 10 reps with ea LE forward.   Set up for safety at sink with chair back to his right & left with Airex (compliant surface) in between Stepping on & off Airex alternating lead LE with intermittent UE touch. SLS on Airex with contralateral LE tapping knee to counter & back to floor - requires UE support but focus on minimizing weight bearing.   Side stepping onto Airex from right 10 reps and from left 10 reps with touch on counter.      HOME EXERCISE PROGRAM: Access Code: [email or web address] URL: [email or web address] Date: 06/10/2024 Prepared by: Grayce Spatz  Exercises - Upright Stance at Door Frame Single Arm  - 1-3 x daily - 7 x weekly - 1 sets - 2 reps - 2  deep breathes hold - Upright Stance at Door Frame with Both Arms  - 1-3 x daily - 7 x weekly - 1 sets - 2 reps - 2 deep breathes hold - standing calf stretch with forefoot on small step or brick  - 1 x daily - 5-7 x weekly - 1 sets - 3 reps - 30 seconds hold - Seated Table Hamstring Stretch  - 1 x daily - 5-7 x weekly - 1 sets - 3 reps - 30 seconds hold - Thomas Stretch on Table  - 1 x daily - 5-7 x weekly - 1 sets - 3 reps - 30 seconds hold - Standing ball rolls front to back  - 1 x daily - 5 x weekly - 1 sets - 10 reps - 5 seconds hold - standing ball rolls inward / outward  - 1 x daily - 5 x weekly - 1 sets - 10 reps - 5 seconds hold - standing ball rolls in circles  - 1 x daily - 5 x weekly - 1 sets - 10 reps - 5 seconds hold - Hip Extension with Resistance Loop  - 1 x daily - 5 x weekly - 1 sets - 10 reps - 5 seconds hold - Standing Hip Abduction with Resistance at Ankles and Counter Support  - 1 x daily - 5 x weekly - 1 sets - 10 reps - 5 seconds hold - Standing Repeated Hip Flexion with Resistance  - 1 x daily - 5 x weekly - 1 sets - 10 reps - 5 seconds hold   ASSESSMENT:  CLINICAL IMPRESSION:  Patient met all LTGs.  He appears to be functioning in community with bilateral prostheses safely.    OBJECTIVE IMPAIRMENTS: Abnormal gait, decreased activity tolerance, decreased  balance, decreased coordination, decreased endurance, decreased knowledge of use of DME, decreased mobility, difficulty walking, decreased ROM, decreased strength, impaired flexibility, improper body mechanics, postural dysfunction, and prosthetic dependency .   ACTIVITY LIMITATIONS: carrying, lifting, bending, sitting, standing, squatting, stairs, transfers, bathing, toileting, dressing, reach over head, hygiene/grooming, and locomotion level  PARTICIPATION LIMITATIONS: meal prep, cleaning, driving, shopping, community activity, occupation, and yard work  PERSONAL FACTORS: Age, Fitness, Past/current experiences, Time  since onset of injury/illness/exacerbation, and 3+ comorbidities: see PMH are also affecting patient's functional outcome.   REHAB POTENTIAL: Good  CLINICAL DECISION MAKING: Evolving/moderate complexity  EVALUATION COMPLEXITY: Moderate   GOALS: Goals reviewed with patient? Yes  SHORT TERM GOALS: Target date: 04/19/2024  Patient donnes prosthesis modified independent & verbalizes proper cleaning. Baseline: SEE OBJECTIVE DATA Goal status: MET   04/16/2024 2.  Patient tolerates right prosthesis >10 hrs total /day without skin issues or limb pain. Baseline: SEE OBJECTIVE DATA Goal status:  MET 04/16/2024  3.  Patient able to reach 7 and look over both shoulders with RW support with supervision. Baseline: SEE OBJECTIVE DATA Goal status: Met 04/16/2024  4. Patient ambulates 47' with RW & prosthesis with CGA. Baseline: SEE OBJECTIVE DATA Goal status: MET   04/16/2024  5. Patient transfers sit to/from stand 18 chair with armrest to RW with CGA.  Baseline: SEE OBJECTIVE DATA Goal status: MET   04/16/2024  LONG TERM GOALS: Target date: 07/04/2024  Patient demonstrates & verbalized understanding of prosthetic care to enable safe utilization of prostheses. Baseline: SEE OBJECTIVE DATA Goal status: Met 07/01/2024  Patient tolerates prostheses wear >90% of awake hours without skin or limb pain issues. Baseline: SEE OBJECTIVE DATA Goal status: Met    07/01/2024  Patient ambulates >500' with LRAD and prostheses independently Baseline: SEE OBJECTIVE DATA Goal status:   Met 07/01/2024  Patient negotiates ramps, curbs & stairs with single rail with prostheses with LRAD independently. Baseline: SEE OBJECTIVE DATA Goal status: ongoing       07/03/2024  Patient demonstrates & verbalizes lifting, carrying, pushing, pulling with prostheses only safely. Baseline: SEE OBJECTIVE DATA Goal status: ongoing    07/03/2024  6.  Patient reports Patient-Specific Activity Score improved the average  >/= 6 to indicate improvement in functional activities.   Baseline: SEE OBJECTIVE DATA Goal status: GOAL MET, 06/06/2024  PLAN:  PT FREQUENCY: 2x/week  PT DURATION: 10 weeks  PLANNED INTERVENTIONS: 97164- PT Re-evaluation, 97750- Physical Performance Testing, 97110-Therapeutic exercises, 97530- Therapeutic activity, [id number]- Neuromuscular re-education, 97535- Self Care, [id number]- Manual therapy, [phone]- Gait training, [phone]- Prosthetic Initial , [phone]- Orthotic/Prosthetic subsequent, Patient/Family education, Balance training, Stair training, Scar mobilization, and DME instructions  PLAN FOR NEXT SESSION:   Discharge PT.    Grayce Spatz, PT, DPT 07/03/2024, 2:26 PM     Referring diagnosis? Z89.511 (ICD-10-CM) - Right below-knee amputee (has history left BKA so now Bil. BKAs) Treatment diagnosis? (if different than referring diagnosis) R26.89, M62.81, M25.661, R26.81 What was this (referring dx) caused by? [x]  Surgery []  Fall []  Ongoing issue []  Arthritis []  Other: ____________  Laterality: []  Rt []  Lt [x]  Both  Check all possible CPT codes:  *CHOOSE 10 OR LESS*    See Planned Interventions listed in the Plan section of the Evaluation.

## 2024-07-08 ENCOUNTER — Encounter: Admitting: Physical Therapy

## 2024-07-10 ENCOUNTER — Encounter

## 2024-07-15 ENCOUNTER — Encounter: Admitting: Physical Therapy

## 2024-07-17 ENCOUNTER — Encounter: Admitting: Physical Therapy

## 2024-08-22 ENCOUNTER — Other Ambulatory Visit (HOSPITAL_COMMUNITY): Payer: Self-pay
